# Patient Record
Sex: Female | Born: 1990
Health system: Southern US, Community
[De-identification: ages and names within clinical notes are randomized; demographics above are authoritative.]

## PROBLEM LIST (undated history)

## (undated) DIAGNOSIS — J189 Pneumonia, unspecified organism: Secondary | ICD-10-CM

## (undated) DIAGNOSIS — M549 Dorsalgia, unspecified: Secondary | ICD-10-CM

## (undated) DIAGNOSIS — F419 Anxiety disorder, unspecified: Secondary | ICD-10-CM

## (undated) DIAGNOSIS — E039 Hypothyroidism, unspecified: Secondary | ICD-10-CM

## (undated) DIAGNOSIS — F32A Depression, unspecified: Secondary | ICD-10-CM

## (undated) DIAGNOSIS — T8859XA Other complications of anesthesia, initial encounter: Secondary | ICD-10-CM

## (undated) DIAGNOSIS — D860 Sarcoidosis of lung: Secondary | ICD-10-CM

## (undated) DIAGNOSIS — D649 Anemia, unspecified: Secondary | ICD-10-CM

## (undated) DIAGNOSIS — R002 Palpitations: Secondary | ICD-10-CM

## (undated) DIAGNOSIS — M2550 Pain in unspecified joint: Secondary | ICD-10-CM

## (undated) DIAGNOSIS — E079 Disorder of thyroid, unspecified: Secondary | ICD-10-CM

## (undated) DIAGNOSIS — F329 Major depressive disorder, single episode, unspecified: Secondary | ICD-10-CM

## (undated) DIAGNOSIS — Z87442 Personal history of urinary calculi: Secondary | ICD-10-CM

## (undated) DIAGNOSIS — J45990 Exercise induced bronchospasm: Secondary | ICD-10-CM

## (undated) DIAGNOSIS — Z8619 Personal history of other infectious and parasitic diseases: Secondary | ICD-10-CM

## (undated) DIAGNOSIS — K219 Gastro-esophageal reflux disease without esophagitis: Secondary | ICD-10-CM

## (undated) DIAGNOSIS — Z9889 Other specified postprocedural states: Secondary | ICD-10-CM

## (undated) DIAGNOSIS — N289 Disorder of kidney and ureter, unspecified: Secondary | ICD-10-CM

## (undated) DIAGNOSIS — F988 Other specified behavioral and emotional disorders with onset usually occurring in childhood and adolescence: Secondary | ICD-10-CM

## (undated) DIAGNOSIS — R0600 Dyspnea, unspecified: Secondary | ICD-10-CM

## (undated) DIAGNOSIS — K760 Fatty (change of) liver, not elsewhere classified: Secondary | ICD-10-CM

## (undated) DIAGNOSIS — G2581 Restless legs syndrome: Secondary | ICD-10-CM

## (undated) DIAGNOSIS — M1990 Unspecified osteoarthritis, unspecified site: Secondary | ICD-10-CM

## (undated) DIAGNOSIS — R600 Localized edema: Secondary | ICD-10-CM

## (undated) DIAGNOSIS — Z9289 Personal history of other medical treatment: Secondary | ICD-10-CM

## (undated) DIAGNOSIS — G43909 Migraine, unspecified, not intractable, without status migrainosus: Secondary | ICD-10-CM

## (undated) DIAGNOSIS — R079 Chest pain, unspecified: Secondary | ICD-10-CM

## (undated) DIAGNOSIS — R112 Nausea with vomiting, unspecified: Secondary | ICD-10-CM

## (undated) HISTORY — DX: Exercise induced bronchospasm: J45.990

## (undated) HISTORY — DX: Dorsalgia, unspecified: M54.9

## (undated) HISTORY — DX: Gastro-esophageal reflux disease without esophagitis: K21.9

## (undated) HISTORY — DX: Migraine, unspecified, not intractable, without status migrainosus: G43.909

## (undated) HISTORY — DX: Major depressive disorder, single episode, unspecified: F32.9

## (undated) HISTORY — DX: Dyspnea, unspecified: R06.00

## (undated) HISTORY — DX: Localized edema: R60.0

## (undated) HISTORY — DX: Personal history of urinary calculi: Z87.442

## (undated) HISTORY — PX: TONSILLECTOMY: SUR1361

## (undated) HISTORY — DX: Fatty (change of) liver, not elsewhere classified: K76.0

## (undated) HISTORY — DX: Unspecified osteoarthritis, unspecified site: M19.90

## (undated) HISTORY — DX: Pain in unspecified joint: M25.50

## (undated) HISTORY — DX: Chest pain, unspecified: R07.9

## (undated) HISTORY — PX: WISDOM TOOTH EXTRACTION: SHX21

## (undated) HISTORY — DX: Depression, unspecified: F32.A

## (undated) HISTORY — DX: Personal history of other infectious and parasitic diseases: Z86.19

## (undated) HISTORY — DX: Restless legs syndrome: G25.81

## (undated) HISTORY — DX: Disorder of thyroid, unspecified: E07.9

---
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
## 2014-06-02 DIAGNOSIS — I10 Essential (primary) hypertension: Secondary | ICD-10-CM | POA: Insufficient documentation

## 2015-01-03 ENCOUNTER — Encounter: Payer: Self-pay | Admitting: Family

## 2015-01-03 ENCOUNTER — Ambulatory Visit (INDEPENDENT_AMBULATORY_CARE_PROVIDER_SITE_OTHER): Payer: BLUE CROSS/BLUE SHIELD | Admitting: Family

## 2015-01-03 ENCOUNTER — Telehealth: Payer: Self-pay | Admitting: Family

## 2015-01-03 VITALS — BP 110/88 | HR 90 | Temp 98.0°F | Resp 16 | Ht 67.0 in | Wt 334.8 lb

## 2015-01-03 DIAGNOSIS — E039 Hypothyroidism, unspecified: Secondary | ICD-10-CM

## 2015-01-03 DIAGNOSIS — F32A Depression, unspecified: Secondary | ICD-10-CM | POA: Insufficient documentation

## 2015-01-03 DIAGNOSIS — Z23 Encounter for immunization: Secondary | ICD-10-CM | POA: Diagnosis not present

## 2015-01-03 DIAGNOSIS — G43909 Migraine, unspecified, not intractable, without status migrainosus: Secondary | ICD-10-CM | POA: Insufficient documentation

## 2015-01-03 DIAGNOSIS — R7989 Other specified abnormal findings of blood chemistry: Secondary | ICD-10-CM

## 2015-01-03 DIAGNOSIS — R945 Abnormal results of liver function studies: Secondary | ICD-10-CM

## 2015-01-03 DIAGNOSIS — Z0000 Encounter for general adult medical examination without abnormal findings: Secondary | ICD-10-CM

## 2015-01-03 DIAGNOSIS — F329 Major depressive disorder, single episode, unspecified: Secondary | ICD-10-CM | POA: Insufficient documentation

## 2015-01-03 DIAGNOSIS — K219 Gastro-esophageal reflux disease without esophagitis: Secondary | ICD-10-CM | POA: Insufficient documentation

## 2015-01-03 DIAGNOSIS — F330 Major depressive disorder, recurrent, mild: Secondary | ICD-10-CM | POA: Insufficient documentation

## 2015-01-03 LAB — URINALYSIS, ROUTINE W REFLEX MICROSCOPIC
BILIRUBIN URINE: NEGATIVE
Ketones, ur: NEGATIVE
NITRITE: NEGATIVE
PH: 6 (ref 5.0–8.0)
TOTAL PROTEIN, URINE-UPE24: NEGATIVE
UROBILINOGEN UA: 0.2 (ref 0.0–1.0)
Urine Glucose: NEGATIVE

## 2015-01-03 LAB — HEPATIC FUNCTION PANEL
ALT: 39 U/L — AB (ref 0–35)
AST: 54 U/L — ABNORMAL HIGH (ref 0–37)
Albumin: 4.3 g/dL (ref 3.5–5.2)
Alkaline Phosphatase: 82 U/L (ref 39–117)
BILIRUBIN TOTAL: 0.4 mg/dL (ref 0.2–1.2)
Bilirubin, Direct: 0.1 mg/dL (ref 0.0–0.3)
Total Protein: 8 g/dL (ref 6.0–8.3)

## 2015-01-03 LAB — CBC WITH DIFFERENTIAL/PLATELET
BASOS PCT: 0.4 % (ref 0.0–3.0)
Basophils Absolute: 0 10*3/uL (ref 0.0–0.1)
EOS PCT: 1.8 % (ref 0.0–5.0)
Eosinophils Absolute: 0.2 10*3/uL (ref 0.0–0.7)
HCT: 39.5 % (ref 36.0–46.0)
Hemoglobin: 12.5 g/dL (ref 12.0–15.0)
LYMPHS ABS: 3 10*3/uL (ref 0.7–4.0)
Lymphocytes Relative: 34.8 % (ref 12.0–46.0)
MCHC: 31.7 g/dL (ref 30.0–36.0)
MCV: 71.8 fl — ABNORMAL LOW (ref 78.0–100.0)
MONO ABS: 0.5 10*3/uL (ref 0.1–1.0)
Monocytes Relative: 5.4 % (ref 3.0–12.0)
NEUTROS PCT: 57.6 % (ref 43.0–77.0)
Neutro Abs: 5 10*3/uL (ref 1.4–7.7)
Platelets: 235 10*3/uL (ref 150.0–400.0)
RBC: 5.5 Mil/uL — ABNORMAL HIGH (ref 3.87–5.11)
RDW: 16.3 % — AB (ref 11.5–15.5)
WBC: 8.6 10*3/uL (ref 4.0–10.5)

## 2015-01-03 LAB — BASIC METABOLIC PANEL
BUN: 11 mg/dL (ref 6–23)
CHLORIDE: 104 meq/L (ref 96–112)
CO2: 25 mEq/L (ref 19–32)
Calcium: 9.5 mg/dL (ref 8.4–10.5)
Creatinine, Ser: 0.82 mg/dL (ref 0.40–1.20)
GFR: 90.41 mL/min (ref >60.00)
GLUCOSE: 94 mg/dL (ref 70–99)
POTASSIUM: 3.9 meq/L (ref 3.5–5.1)
Sodium: 140 mEq/L (ref 135–145)

## 2015-01-03 LAB — LIPID PANEL
CHOL/HDL RATIO: 4
Cholesterol: 158 mg/dL (ref 0–200)
HDL: 41.1 mg/dL (ref >39.00)
LDL CALC: 89 mg/dL (ref 0–99)
NonHDL: 117.06
TRIGLYCERIDES: 139 mg/dL (ref 0.0–149.0)
VLDL: 27.8 mg/dL (ref 0.0–40.0)

## 2015-01-03 LAB — TSH: TSH: 5.27 u[IU]/mL — ABNORMAL HIGH (ref 0.35–4.50)

## 2015-01-03 MED ORDER — OMEPRAZOLE 20 MG PO CPDR: 20.0000 mg | DELAYED_RELEASE_CAPSULE | Freq: Every day | ORAL | Status: DC

## 2015-01-03 MED ORDER — LEVOTHYROXINE SODIUM 125 MCG PO TABS: 125.0000 ug | ORAL_TABLET | Freq: Every day | ORAL | Status: DC

## 2015-01-03 NOTE — Assessment & Plan Note (Signed)
Stable on PPI. Has not tolerated trying to come off of PPI.

## 2015-01-03 NOTE — Telephone Encounter (Signed)
Also, her liver function testing is elevated.  I would like her to complete an abdominal ultrasound to evaluate her liver.

## 2015-01-03 NOTE — Patient Instructions (Signed)
Please complete lab work prior to leaving. Schedule a fasting physical at the front desk. Welcome to [facility]! 

## 2015-01-03 NOTE — Progress Notes (Signed)
Pre visit review using our clinic review tool, if applicable. No additional management support is needed unless otherwise documented below in the visit note. 

## 2015-01-03 NOTE — Assessment & Plan Note (Signed)
>>  ASSESSMENT AND PLAN FOR DEPRESSION WRITTEN ON 01/03/2015 12:06 PM BY O'SULLIVAN, Valora Norell, NP  Uncontrolled. Pt is encouraged to follow up as scheduled with psychiatry.

## 2015-01-03 NOTE — Assessment & Plan Note (Signed)
These are managed by use of prn excedrin.

## 2015-01-03 NOTE — Telephone Encounter (Signed)
Please contact pt and let her know that lab work shows Synthroid should be increased from to 125 mcg. I have sent rx to her pharmacy. We can repeat lab at her physical.

## 2015-01-03 NOTE — Progress Notes (Signed)
Subjective:    Patient ID: Gay Filler, female    DOB: [birth year], 25 y.o.   MRN: [id number]  HPI  Ms. Pollick is a 24 yr old female who presents today to establish care.  Pmhx is significant for the following:  1) Depression- reports that latuda helps to control her anger. This was added after her most recent visit with her psychiatrist. Despite addition of latuda, she reports that she has lethargy,  Feels Unmotivated.  She is followed by Vear Clock DNP for psychiatry.  She reports that she has follow up on 01/18/15. She also sees Lavella Lemons weekly- counselor.  Denies SI/HI  2) Hypothyroid- was diagnosed at least 3 years ago.    3) Kidney Stones- had flare 8 years ago.    4) GERD- reports that she is well controlled on  of prilosec.    5) Migraines- uses otc excedrin migraine which helps, reports 2  Migraines/month. Reports that this is improved for her.  Review of Systems  Constitutional:       Reports weight has been stable  HENT: Positive for rhinorrhea.   Eyes: Negative for visual disturbance.  Respiratory: Negative for cough.   Cardiovascular: Negative for leg swelling.  Gastrointestinal: Negative for diarrhea and constipation.  Genitourinary: Negative for dysuria, frequency and menstrual problem.  Musculoskeletal: Negative for myalgias and arthralgias.  Skin: Negative for rash.  Neurological: Positive for headaches.  Hematological: Negative for adenopathy.  Psychiatric/Behavioral:       See HPI      Past Medical History  Diagnosis Date  . History of chicken pox   . Depression   . History of kidney stones   . Thyroid disease     hypothyroid    Social History   Social History  . Marital Status: Single    Spouse Name: N/A  . Number of Children: N/A  . Years of Education: N/A   Occupational History  . Not on file.   Social History Main Topics  . Smoking status: Never Smoker   . Smokeless tobacco: Never Used  . Alcohol Use: No  . Drug Use: No    . Sexual Activity: Not on file   Other Topics Concern  . Not on file   Social History Narrative    Past Surgical History  Procedure Laterality Date  . Tonsillectomy  2010  . Knee arthroscopy Right 2007    Family History  Problem Relation Age of Onset  . Cancer Paternal Aunt     colon  . Cancer Paternal Uncle     "small cell cancer"  . Arthritis Maternal Grandmother   . Diabetes Maternal Grandfather   . Arthritis Maternal Grandfather   . Arthritis Paternal Grandmother   . Arthritis Paternal Grandfather     No Known Allergies  No current outpatient prescriptions on file prior to visit.   No current facility-administered medications on file prior to visit.    BP 110/88 mmHg  Pulse 90  Temp(Src) 98 F (36.7 C) (Oral)  Resp 16  Ht  (1.702 m)  Wt 334 lb 12.8 oz (151.864 kg)  BMI 52.42 kg/m2  SpO2 98%  LMP 12/22/2014    Objective:   Physical Exam  Constitutional: She is oriented to person, place, and time.  Morbidly obese white female in NAD  HENT:  Head: Normocephalic and atraumatic.  Right Ear: Tympanic membrane and ear canal normal.  Left Ear: Tympanic membrane and ear canal normal.  Mouth/Throat: No oropharyngeal exudate, posterior oropharyngeal  edema or posterior oropharyngeal erythema.  Eyes: No scleral icterus.  Neck: No thyromegaly present.  Cardiovascular: Normal rate and regular rhythm.   No murmur heard. Pulmonary/Chest: Effort normal and breath sounds normal. No respiratory distress. She has no wheezes. She has no rales. She exhibits no tenderness.  Musculoskeletal: She exhibits no edema.  Lymphadenopathy:    She has no cervical adenopathy.  Neurological: She is alert and oriented to person, place, and time.  Skin: Skin is warm and dry.  Psychiatric: Her behavior is normal. Judgment and thought content normal.  Flat affect          Assessment & Plan:

## 2015-01-03 NOTE — Assessment & Plan Note (Signed)
Uncontrolled. Pt is encouraged to follow up as scheduled with psychiatry.

## 2015-01-03 NOTE — Assessment & Plan Note (Signed)
Obtain TSH, continue synthroid.  

## 2015-01-04 NOTE — Telephone Encounter (Signed)
Left message for pt to return my call.

## 2015-01-05 ENCOUNTER — Ambulatory Visit (HOSPITAL_BASED_OUTPATIENT_CLINIC_OR_DEPARTMENT_OTHER)
Admission: RE | Admit: 2015-01-05 | Discharge: 2015-01-05 | Disposition: A | Discharge status: Home / Self Care | Payer: BLUE CROSS/BLUE SHIELD | Source: Ambulatory Visit | Attending: Family | Admitting: Family

## 2015-01-05 DIAGNOSIS — K760 Fatty (change of) liver, not elsewhere classified: Secondary | ICD-10-CM | POA: Insufficient documentation

## 2015-01-05 DIAGNOSIS — R7989 Other specified abnormal findings of blood chemistry: Secondary | ICD-10-CM | POA: Diagnosis present

## 2015-01-05 NOTE — Telephone Encounter (Signed)
Pt completed US.  US shows fatty liver.  Low fat/low cholesterol, diet, exercise and weight loss are the best treatment for fatty liver.

## 2015-01-05 NOTE — Telephone Encounter (Signed)
Left message for pt to return my call.

## 2015-01-06 NOTE — Telephone Encounter (Signed)
LDMOC for pt to call back if she has any questions about the lab results.

## 2015-02-02 ENCOUNTER — Encounter: Payer: Self-pay | Admitting: Family

## 2015-02-02 ENCOUNTER — Ambulatory Visit (INDEPENDENT_AMBULATORY_CARE_PROVIDER_SITE_OTHER): Payer: BLUE CROSS/BLUE SHIELD | Admitting: Family

## 2015-02-02 VITALS — BP 116/80 | HR 64 | Temp 97.9°F | Resp 16 | Ht 67.0 in | Wt 332.8 lb

## 2015-02-02 DIAGNOSIS — Z114 Encounter for screening for human immunodeficiency virus [HIV]: Secondary | ICD-10-CM

## 2015-02-02 DIAGNOSIS — R718 Other abnormality of red blood cells: Secondary | ICD-10-CM

## 2015-02-02 DIAGNOSIS — Z0000 Encounter for general adult medical examination without abnormal findings: Secondary | ICD-10-CM

## 2015-02-02 DIAGNOSIS — K760 Fatty (change of) liver, not elsewhere classified: Secondary | ICD-10-CM | POA: Insufficient documentation

## 2015-02-02 DIAGNOSIS — E039 Hypothyroidism, unspecified: Secondary | ICD-10-CM

## 2015-02-02 MED ORDER — MULTI-VITAMIN/MINERALS PO TABS: 1.0000 | ORAL_TABLET | Freq: Every day | ORAL | Status: AC

## 2015-02-02 NOTE — Patient Instructions (Signed)
Please complete lab work prior to leaving. Work on healthy well balanced diet, exercise and weight loss. You will be contacted about your referral to nutrition. Start multivitamin with minerals once daily.

## 2015-02-02 NOTE — Progress Notes (Signed)
Pre visit review using our clinic review tool, if applicable. No additional management support is needed unless otherwise documented below in the visit note. 

## 2015-02-02 NOTE — Progress Notes (Signed)
Subjective:    Patient ID: Margaret Golden, female    DOB: 05/14/1990, 24 y.o.   MRN: [id number]  HPI   Margaret Golden is a 24 yr old female who presents today for cpx.  Patient presents today for complete physical.  Immunizations: ? Last tetanus. Flu shot up to date Diet: reports poor food choices, +late night eating, not exercising.  Lately has not had appetite due to depression symptoms. They are being evicted from their apartment and in search of a new housing option. Wt Readings from Last 3 Encounters:  02/02/15 332 lb 12.8 oz (150.957 kg)  01/03/15 334 lb 12.8 oz (151.864 kg)  Pap Smear: 2/16- normal per pt- done by GYN. Reports that she is not sexually active  Depression- Recently stopped latuda per psych recs..  Feels "numb" has follow up with Vear ClockLeslie O'Neil NP in 2 weeks. Denies SI.  Hypothyroid- last visit TSH was elevated and it was recommended that she increase synthroid dose from 100 mcg to 125 mcg.   Review of Systems  Constitutional: Negative for unexpected weight change.  HENT: Negative for rhinorrhea.   Respiratory: Negative for cough.   Cardiovascular: Negative for leg swelling.  Gastrointestinal: Negative for constipation.       Mild diarrhea for the last few days  Genitourinary: Negative for dysuria and frequency.       Reports periods are heavy  Musculoskeletal: Negative for myalgias and arthralgias.  Skin: Negative for rash.  Neurological:       Reports HA's have been stable  Hematological: Negative for adenopathy.  Psychiatric/Behavioral:       Some anxiety- at baseline.     Past Medical History  Diagnosis Date  . History of chicken pox   . Depression   . History of kidney stones   . Thyroid disease     hypothyroid    Social History   Social History  . Marital Status: Single    Spouse Name: N/A  . Number of Children: N/A  . Years of Education: N/A   Occupational History  . Not on file.   Social History Main Topics  . Smoking status: Never  Smoker   . Smokeless tobacco: Never Used  . Alcohol Use: No  . Drug Use: No  . Sexual Activity: Not on file   Other Topics Concern  . Not on file   Social History Narrative   Unemployed   Lives with Dad   Seeking work   Only child   Dog     Past Surgical History  Procedure Laterality Date  . Tonsillectomy  2010  . Knee arthroscopy Right 2007    Family History  Problem Relation Age of Onset  . Cancer Paternal Aunt     colon  . Cancer Paternal Uncle     "small cell cancer"  . Arthritis Maternal Grandmother   . Diabetes Maternal Grandfather   . Arthritis Maternal Grandfather   . Arthritis Paternal Grandmother   . Arthritis Paternal Grandfather     No Known Allergies  Current Outpatient Prescriptions on File Prior to Visit  Medication Sig Dispense Refill  . hydrOXYzine (ATARAX/VISTARIL) 25 MG tablet Take 25 mg by mouth 3 (three) times daily.    Marland Kitchen. levothyroxine (SYNTHROID, LEVOTHROID) 125 MCG tablet Take 1 tablet (125 mcg total) by mouth daily. 30 tablet 2  . omeprazole (PRILOSEC) 20 MG capsule Take 1 capsule (20 mg total) by mouth daily. 90 capsule 1   No current facility-administered medications on file  prior to visit.    BP 116/80 mmHg  Pulse 64  Temp(Src) 97.9 F (36.6 C) (Oral)  Resp 16  Ht  (1.702 m)  Wt 332 lb 12.8 oz (150.957 kg)  BMI 52.11 kg/m2  LMP 01/20/2015       Objective:   Physical Exam  Physical Exam  Constitutional: She is oriented to person, place, and time. She appears morbidly obese.No distress. + body odor noted HENT:  Head: Normocephalic and atraumatic.  Right Ear: Tympanic membrane and ear canal normal.  Left Ear: Tympanic membrane and ear canal normal.  Mouth/Throat: Oropharynx is clear and moist.  Eyes: Pupils are equal, round, and reactive to light. No scleral icterus.  Neck: Normal range of motion. No thyromegaly present.  Cardiovascular: Normal rate and regular rhythm.   No murmur heard. Pulmonary/Chest: Effort  normal and breath sounds normal. No respiratory distress. He has no wheezes. She has no rales. She exhibits no tenderness.  Abdominal: Soft. Bowel sounds are normal. He exhibits no distension and no mass. There is no tenderness. There is no rebound and no guarding.  Musculoskeletal: She exhibits no edema.  Lymphadenopathy:    She has no cervical adenopathy.  Neurological: She is alert and oriented to person, place, and time.  She exhibits normal muscle tone.  Skin: Skin is warm and dry.  Psychiatric: She has a normal mood and affect. Her behavior is normal. Judgment and thought content normal.  Breasts: Examined lying Right: Without masses, retractions, discharge or axillary adenopathy.  Left: Without masses, retractions, discharge or axillary adenopathy.  Pelvic: deferred          Assessment & Plan:        Assessment & Plan:  Hypothyroid- obtain follow up TSH  Microcytosis- noted last visit on CBC. Add mvi with minerals, obtain serum iron level. She tells me that all she ate yesterday was a sleeve of saltines and water.  We discussed importance of a well balanced diet with protein, fruits/veggies.   Fatty liver- discussed that low fat/low cholesterol diet, exercise and weigh loss are the best treatment for fatty liver.  Preventative care- Tdap today.  Weight loss- nutrition referral.

## 2015-02-03 LAB — TSH: TSH: 2.04 u[IU]/mL (ref 0.35–4.50)

## 2015-02-03 LAB — IRON: IRON: 33 ug/dL — AB (ref 42–145)

## 2015-02-04 ENCOUNTER — Telehealth: Payer: Self-pay | Admitting: Family

## 2015-02-04 DIAGNOSIS — D509 Iron deficiency anemia, unspecified: Secondary | ICD-10-CM

## 2015-02-04 MED ORDER — IRON 325 (65 FE) MG PO TABS: 1.0000 | ORAL_TABLET | Freq: Two times a day (BID) | ORAL | Status: DC

## 2015-02-04 NOTE — Telephone Encounter (Signed)
Left detailed message on pt's cell# to call and schedule lab appt in 3 months. Lab order entered.

## 2015-02-04 NOTE — Telephone Encounter (Signed)
Iron is low.  Add mvi + iron 325mg  twice daily, repeat iron level in 3 months, dx iron deficiency.  Thyroid function is normal.

## 2015-03-02 ENCOUNTER — Telehealth: Payer: Self-pay | Admitting: *Deleted

## 2015-03-02 NOTE — Telephone Encounter (Signed)
Received pt signed ROI release from Charlies SilversParish A. McKinney, MD for lab results form Jan 2016 to present. Requested info faxed to [phone] successfully. Release sent for scanning. JG//CMA

## 2015-04-13 ENCOUNTER — Other Ambulatory Visit: Payer: Self-pay | Admitting: Family

## 2015-04-13 ENCOUNTER — Ambulatory Visit (INDEPENDENT_AMBULATORY_CARE_PROVIDER_SITE_OTHER): Payer: Self-pay | Admitting: Family

## 2015-04-13 ENCOUNTER — Encounter: Payer: Self-pay | Admitting: Family

## 2015-04-13 VITALS — BP 108/80 | HR 113 | Temp 97.7°F | Resp 18 | Ht 67.0 in | Wt 328.2 lb

## 2015-04-13 DIAGNOSIS — D509 Iron deficiency anemia, unspecified: Secondary | ICD-10-CM

## 2015-04-13 LAB — CBC WITH DIFFERENTIAL/PLATELET
BASOS PCT: 0.4 % (ref 0.0–3.0)
Basophils Absolute: 0 10*3/uL (ref 0.0–0.1)
EOS ABS: 0.2 10*3/uL (ref 0.0–0.7)
EOS PCT: 2.1 % (ref 0.0–5.0)
HEMATOCRIT: 40.8 % (ref 36.0–46.0)
HEMOGLOBIN: 12.9 g/dL (ref 12.0–15.0)
LYMPHS PCT: 22.3 % (ref 12.0–46.0)
Lymphs Abs: 2.3 10*3/uL (ref 0.7–4.0)
MCHC: 31.5 g/dL (ref 30.0–36.0)
MCV: 72.4 fl — ABNORMAL LOW (ref 78.0–100.0)
Monocytes Absolute: 0.5 10*3/uL (ref 0.1–1.0)
Monocytes Relative: 4.5 % (ref 3.0–12.0)
Neutro Abs: 7.3 10*3/uL (ref 1.4–7.7)
Neutrophils Relative %: 70.7 % (ref 43.0–77.0)
Platelets: 245 10*3/uL (ref 150.0–400.0)
RBC: 5.64 Mil/uL — AB (ref 3.87–5.11)
RDW: 16 % — AB (ref 11.5–15.5)
WBC: 10.3 10*3/uL (ref 4.0–10.5)

## 2015-04-13 LAB — IRON: Iron: 44 ug/dL (ref 42–145)

## 2015-04-13 MED ORDER — IRON 325 (65 FE) MG PO TABS: 1.0000 | ORAL_TABLET | Freq: Every day | ORAL | Status: DC

## 2015-04-13 NOTE — Progress Notes (Signed)
Subjective:    Patient ID: Margaret Golden, female    DOB: 02/20/1991, 25 y.o.   MRN: [id number]  HPI  [name] is a 25 yr old female who presents today with chief complaint of voice hoarseness.  Reports nasal congestion began 5 days ago. Developed cough on Monday. She denies fever.  Has not tried any otc meds.  Denies ear pain or throat pain.   Review of Systems See HPI  Past Medical History  Diagnosis Date  . History of chicken pox   . Depression   . History of kidney stones   . Thyroid disease     hypothyroid    Social History   Social History  . Marital Status: Single    Spouse Name: N/A  . Number of Children: N/A  . Years of Education: N/A   Occupational History  . Not on file.   Social History Main Topics  . Smoking status: Never Smoker   . Smokeless tobacco: Never Used  . Alcohol Use: No  . Drug Use: No  . Sexual Activity: Not on file   Other Topics Concern  . Not on file   Social History Narrative   Unemployed   Lives with Dad   Seeking work   Only child   Dog     Past Surgical History  Procedure Laterality Date  . Tonsillectomy  2010  . Knee arthroscopy Right 2007    Family History  Problem Relation Age of Onset  . Cancer Paternal Aunt     colon  . Cancer Paternal Uncle     "small cell cancer"  . Arthritis Maternal Grandmother   . Diabetes Maternal Grandfather   . Arthritis Maternal Grandfather   . Arthritis Paternal Grandmother   . Arthritis Paternal Grandfather     No Known Allergies  Current Outpatient Prescriptions on File Prior to Visit  Medication Sig Dispense Refill  . Ferrous Sulfate (IRON) 325 (65 FE) MG TABS Take 1 tablet by mouth 2 (two) times daily. 30 each 0  . hydrOXYzine (ATARAX/VISTARIL) 25 MG tablet Take 25 mg by mouth 3 (three) times daily.    Marland Kitchen. levothyroxine (SYNTHROID, LEVOTHROID) 125 MCG tablet Take 1 tablet (125 mcg total) by mouth daily. 30 tablet 2  . Multiple Vitamins-Minerals (MULTIVITAMIN WITH MINERALS)  tablet Take 1 tablet by mouth daily.    Marland Kitchen. omeprazole (PRILOSEC) 20 MG capsule Take 1 capsule (20 mg total) by mouth daily. 90 capsule 1   No current facility-administered medications on file prior to visit.    BP 108/80 mmHg  Pulse 113  Temp(Src) 97.7 F (36.5 C) (Oral)  Resp 18  Ht 5\' 7"  (1.702 m)  Wt 328 lb 3.2 oz (148.871 kg)  BMI 51.39 kg/m2  SpO2 98%  LMP 04/06/2015       Objective:   Physical Exam  Constitutional: She appears well-developed and well-nourished.  HENT:  Right Ear: Tympanic membrane and ear canal normal.  Left Ear: Tympanic membrane and ear canal normal.  Mouth/Throat: No oropharyngeal exudate, posterior oropharyngeal edema or posterior oropharyngeal erythema.  Cardiovascular: Normal rate, regular rhythm and normal heart sounds.   No murmur heard. Pulmonary/Chest: Effort normal and breath sounds normal. No respiratory distress. She has no wheezes.  Psychiatric: She has a normal mood and affect. Her behavior is normal. Judgment and thought content normal.          Assessment & Plan:  Symptoms most consistent with viral URI.  Advised pt:   Please rest,  drink lots of fluids, use tylenol/motrin as needed. Call if symptoms worsen, if fever >101 or if your symptoms are not improved in 3-4 days.   Iron def Anemia- repeat iron level/cbc today. She is on iron supplement.

## 2015-04-13 NOTE — Progress Notes (Signed)
Pre visit review using our clinic review tool, if applicable. No additional management support is needed unless otherwise documented below in the visit note. 

## 2015-04-13 NOTE — Patient Instructions (Signed)
Complete lab work prior to leaving.  Your symptoms are most consistent with a viral upper respiratory infection. Please rest, drink lots of fluids, use tylenol/motrin as needed. Call if symptoms worsen, if fever >101 or if your symptoms are not improved in 3-4 days.

## 2015-04-20 ENCOUNTER — Encounter: Payer: Self-pay | Admitting: Family

## 2015-04-20 ENCOUNTER — Ambulatory Visit (INDEPENDENT_AMBULATORY_CARE_PROVIDER_SITE_OTHER): Payer: Self-pay | Admitting: Family

## 2015-04-20 VITALS — BP 110/84 | HR 84 | Temp 98.4°F | Resp 18 | Ht 67.0 in | Wt 326.2 lb

## 2015-04-20 DIAGNOSIS — J209 Acute bronchitis, unspecified: Secondary | ICD-10-CM | POA: Diagnosis not present

## 2015-04-20 MED ORDER — AZITHROMYCIN 250 MG PO TABS: ORAL_TABLET | ORAL | Status: DC

## 2015-04-20 NOTE — Progress Notes (Signed)
Pre visit review using our clinic review tool, if applicable. No additional management support is needed unless otherwise documented below in the visit note. 

## 2015-04-20 NOTE — Patient Instructions (Signed)
Start zithromax. Continue otc cough medication as needed such as Delsym. Call if symptoms worsen, if you develop fever, or if symptoms are not improved in 3 days.

## 2015-04-20 NOTE — Progress Notes (Signed)
Subjective:    Patient ID: Gay Filler, female    DOB: June 08, 1990, 25 y.o.   MRN: [id number]  HPI  Ms. Stegall is a 25 yr old female who presents today for follow up. She was seen 1 week ago with sxs most consistent with viral URI.  She was advised on supportive measures. Today she reports no improvement in her laryngitis is unchanged.  Reports cough has worsened.  Mild nasal congestion, moderate chest congestion. Energy has been low. Denies associated sore throat or ear pain.  Denies wheezing.  Initial sxs began on 04/10/15.   Review of Systems See HPI  Past Medical History  Diagnosis Date  . History of chicken pox   . Depression   . History of kidney stones   . Thyroid disease     hypothyroid    Social History   Social History  . Marital Status: Single    Spouse Name: N/A  . Number of Children: N/A  . Years of Education: N/A   Occupational History  . Not on file.   Social History Main Topics  . Smoking status: Never Smoker   . Smokeless tobacco: Never Used  . Alcohol Use: No  . Drug Use: No  . Sexual Activity: Not on file   Other Topics Concern  . Not on file   Social History Narrative   Unemployed   Lives with Dad   Seeking work   Only child   Dog     Past Surgical History  Procedure Laterality Date  . Tonsillectomy  2010  . Knee arthroscopy Right 2007    Family History  Problem Relation Age of Onset  . Cancer Paternal Aunt     colon  . Cancer Paternal Uncle     "small cell cancer"  . Arthritis Maternal Grandmother   . Diabetes Maternal Grandfather   . Arthritis Maternal Grandfather   . Arthritis Paternal Grandmother   . Arthritis Paternal Grandfather     No Known Allergies  Current Outpatient Prescriptions on File Prior to Visit  Medication Sig Dispense Refill  . Ferrous Sulfate (IRON) 325 (65 Fe) MG TABS Take 1 tablet by mouth daily. 30 each 0  . hydrOXYzine (ATARAX/VISTARIL) 25 MG tablet Take 25 mg by mouth 3 (three) times daily.    Marland Kitchen  levothyroxine (SYNTHROID, LEVOTHROID) 125 MCG tablet Take 1 tablet (125 mcg total) by mouth daily. 30 tablet 2  . Multiple Vitamins-Minerals (MULTIVITAMIN WITH MINERALS) tablet Take 1 tablet by mouth daily.    Marland Kitchen omeprazole (PRILOSEC) 20 MG capsule Take 1 capsule (20 mg total) by mouth daily. 90 capsule 1   No current facility-administered medications on file prior to visit.    BP 110/84 mmHg  Pulse 84  Temp(Src) 98.4 F (36.9 C) (Oral)  Resp 18  Ht  (1.702 m)  Wt 326 lb 3.2 oz (147.963 kg)  BMI 51.08 kg/m2  SpO2 100%  LMP 04/06/2015       Objective:   Physical Exam  Constitutional: She appears well-developed and well-nourished.  HENT:  Head: Normocephalic and atraumatic.  Right Ear: Tympanic membrane and ear canal normal.  Left Ear: Tympanic membrane and ear canal normal.  Mouth/Throat: No oropharyngeal exudate, posterior oropharyngeal edema or posterior oropharyngeal erythema.  Mild voice hoarseness  Cardiovascular: Normal rate, regular rhythm and normal heart sounds.   No murmur heard. Pulmonary/Chest: Effort normal and breath sounds normal. No respiratory distress. She has no wheezes.  Psychiatric: She has a normal mood and  affect. Her behavior is normal. Judgment and thought content normal.          Assessment & Plan:  Bronchitis- given worsening sxs and duration, will rx with zpak. She feels otc cough suppressant is adequate.

## 2015-05-01 ENCOUNTER — Encounter (HOSPITAL_BASED_OUTPATIENT_CLINIC_OR_DEPARTMENT_OTHER): Payer: Self-pay

## 2015-05-01 ENCOUNTER — Emergency Department (HOSPITAL_BASED_OUTPATIENT_CLINIC_OR_DEPARTMENT_OTHER): Payer: No Typology Code available for payment source

## 2015-05-01 ENCOUNTER — Emergency Department (HOSPITAL_BASED_OUTPATIENT_CLINIC_OR_DEPARTMENT_OTHER)
Admission: EM | Admit: 2015-05-01 | Discharge: 2015-05-01 | Disposition: A | Discharge status: Home / Self Care | Payer: No Typology Code available for payment source | Attending: Emergency Medicine | Admitting: Emergency Medicine

## 2015-05-01 DIAGNOSIS — J159 Unspecified bacterial pneumonia: Secondary | ICD-10-CM | POA: Diagnosis not present

## 2015-05-01 DIAGNOSIS — Z79899 Other long term (current) drug therapy: Secondary | ICD-10-CM | POA: Diagnosis not present

## 2015-05-01 DIAGNOSIS — R05 Cough: Secondary | ICD-10-CM | POA: Diagnosis present

## 2015-05-01 DIAGNOSIS — Z3202 Encounter for pregnancy test, result negative: Secondary | ICD-10-CM | POA: Insufficient documentation

## 2015-05-01 DIAGNOSIS — Z8619 Personal history of other infectious and parasitic diseases: Secondary | ICD-10-CM | POA: Diagnosis not present

## 2015-05-01 DIAGNOSIS — E039 Hypothyroidism, unspecified: Secondary | ICD-10-CM | POA: Insufficient documentation

## 2015-05-01 DIAGNOSIS — R030 Elevated blood-pressure reading, without diagnosis of hypertension: Secondary | ICD-10-CM | POA: Insufficient documentation

## 2015-05-01 DIAGNOSIS — R000 Tachycardia, unspecified: Secondary | ICD-10-CM | POA: Diagnosis not present

## 2015-05-01 DIAGNOSIS — Z87448 Personal history of other diseases of urinary system: Secondary | ICD-10-CM | POA: Diagnosis not present

## 2015-05-01 DIAGNOSIS — J189 Pneumonia, unspecified organism: Secondary | ICD-10-CM

## 2015-05-01 DIAGNOSIS — F329 Major depressive disorder, single episode, unspecified: Secondary | ICD-10-CM | POA: Insufficient documentation

## 2015-05-01 DIAGNOSIS — M25512 Pain in left shoulder: Secondary | ICD-10-CM | POA: Insufficient documentation

## 2015-05-01 LAB — URINALYSIS, ROUTINE W REFLEX MICROSCOPIC
Bilirubin Urine: NEGATIVE
Glucose, UA: NEGATIVE mg/dL
Hgb urine dipstick: NEGATIVE
Ketones, ur: NEGATIVE mg/dL
NITRITE: NEGATIVE
PROTEIN: NEGATIVE mg/dL
Specific Gravity, Urine: 1.023 (ref 1.005–1.030)
pH: 5.5 (ref 5.0–8.0)

## 2015-05-01 LAB — URINE MICROSCOPIC-ADD ON: RBC / HPF: NONE SEEN RBC/hpf (ref 0–5)

## 2015-05-01 LAB — PREGNANCY, URINE: PREG TEST UR: NEGATIVE

## 2015-05-01 MED ORDER — DOXYCYCLINE HYCLATE 100 MG PO CAPS: 100.0000 mg | ORAL_CAPSULE | Freq: Two times a day (BID) | ORAL | Status: DC

## 2015-05-01 MED ORDER — OXYCODONE-ACETAMINOPHEN 5-325 MG PO TABS
1.0000 | ORAL_TABLET | Freq: Once | ORAL | Status: AC
  Administered 2015-05-01: 1 via ORAL
  Filled 2015-05-01: qty 1

## 2015-05-01 NOTE — ED Provider Notes (Signed)
CSN: [id number]     Arrival date & time 05/01/15  1007 History   First MD Initiated Contact with Patient 05/01/15 1200     Chief Complaint  Patient presents with  . Flank Pain  . Shoulder Pain     (Consider location/radiation/quality/duration/timing/severity/associated sxs/prior Treatment) HPI Margaret Golden is a 25 y.o. female history of kidney stones, comes in for a dilation of left-sided flank and shoulder pain. Patient reports symptoms started gradually this morning at 3:00 AM, has been constant since that time. She reports sensation is "sharp like needles". Breathing and deep respirations worsen her discomfort. Nothing seems to make it better. She currently rates her discomfort as 9/10. She denies any associated fever, cough, shortness of breath or rash. No abdominal pain, nausea or vomiting, urinary symptoms, pelvic pain, vaginal bleeding or discharge. Last normal menstrual cycle was the second week of January and normal. No other modifying factors.  Past Medical History  Diagnosis Date  . History of chicken pox   . Depression   . History of kidney stones   . Thyroid disease     hypothyroid   Past Surgical History  Procedure Laterality Date  . Tonsillectomy  2010  . Knee arthroscopy Right 2007   Family History  Problem Relation Age of Onset  . Cancer Paternal Aunt     colon  . Cancer Paternal Uncle     "small cell cancer"  . Arthritis Maternal Grandmother   . Diabetes Maternal Grandfather   . Arthritis Maternal Grandfather   . Arthritis Paternal Grandmother   . Arthritis Paternal Grandfather    Social History  Substance Use Topics  . Smoking status: Never Smoker   . Smokeless tobacco: Never Used  . Alcohol Use: No   OB History    No data available     Review of Systems A 10 point review of systems was completed and was negative except for pertinent positives and negatives as mentioned in the history of present illness     Allergies  Review of patient's  allergies indicates no known allergies.  Home Medications   Prior to Admission medications   Medication Sig Start Date End Date Taking? Authorizing Provider  buPROPion (WELLBUTRIN SR) 150 MG 12 hr tablet Take 150 mg by mouth daily.   Yes Historical Provider, MD  doxycycline (VIBRAMYCIN) 100 MG capsule Take 1 capsule (100 mg total) by mouth 2 (two) times daily. One po bid x 7 days 05/01/15   Joycie Peek, PA-C  Ferrous Sulfate (IRON) 325 (65 Fe) MG TABS Take 1 tablet by mouth daily. 04/13/15   [name], NP  hydrOXYzine (ATARAX/VISTARIL) 25 MG tablet Take 25 mg by mouth 3 (three) times daily.    Historical Provider, MD  levothyroxine (SYNTHROID, LEVOTHROID) 125 MCG tablet Take 1 tablet (125 mcg total) by mouth daily. 01/03/15   [name], NP  Multiple Vitamins-Minerals (MULTIVITAMIN WITH MINERALS) tablet Take 1 tablet by mouth daily. 02/02/15   [name], NP  omeprazole (PRILOSEC) 20 MG capsule Take 1 capsule (20 mg total) by mouth daily. 01/03/15 01/03/16  [name], NP   BP 120/86 mmHg  Pulse 88  Temp(Src) 98.4 F (36.9 C) (Oral)  Resp 20  Ht  (1.702 m)  Wt 145.151 kg  BMI 50.11 kg/m2  SpO2 98%  LMP 04/06/2015 Physical Exam  Constitutional: She is oriented to person, place, and time. She appears well-developed and well-nourished.  Morbidly obese Caucasian female. No apparent distress  HENT:  Head: Normocephalic and  atraumatic.  Mouth/Throat: Oropharynx is clear and moist.  Eyes: Conjunctivae are normal. Pupils are equal, round, and reactive to light. Right eye exhibits no discharge. Left eye exhibits no discharge. No scleral icterus.  Neck: Neck supple.  Cardiovascular: Normal rate, regular rhythm and normal heart sounds.   Pulmonary/Chest: Breath sounds normal. No respiratory distress. She has no wheezes. She has no rales.  Weak effort on respirations  Abdominal: Soft. She exhibits no distension. There is no tenderness. There is no rebound  and no guarding.  No CVA tenderness  Musculoskeletal:  Diffuse tenderness throughout left posterior mid thoracic ribs. Palpation reproduces same sharp needlelike pain. No crepitus, erythema, rash or other abnormalities noted. Full active range of motion of left shoulder with no focal bony tenderness or other abnormalities.  Neurological: She is alert and oriented to person, place, and time.  Cranial Nerves II-XII grossly intact  Skin: Skin is warm and dry. No rash noted.  Psychiatric: She has a normal mood and affect.  Nursing note and vitals reviewed.   ED Course  Procedures (including critical care time) Labs Review Labs Reviewed  URINALYSIS, ROUTINE W REFLEX MICROSCOPIC (NOT AT [facility]) - Abnormal; Notable for the following:    APPearance CLOUDY (*)    Leukocytes, UA TRACE (*)    All other components within normal limits  URINE MICROSCOPIC-ADD ON - Abnormal; Notable for the following:    Squamous Epithelial / LPF 0-5 (*)    Bacteria, UA MANY (*)    Casts HYALINE CASTS (*)    All other components within normal limits  URINE CULTURE  PREGNANCY, URINE    Imaging Review Dg Chest 2 View  05/01/2015  CLINICAL DATA:  Cough, shortness of breath, recent bronchitis. EXAM: CHEST  2 VIEW COMPARISON:  Unavailable FINDINGS: low lung volumes with linear bandlike left basilar opacity compatible with atelectasis. Difficult to exclude early developing pneumonia. Right lung remains clear. Normal heart size and vascularity. No edema, effusion, or pneumothorax. Trachea midline. No osseous abnormality. IMPRESSION: Left basilar atelectasis versus early pneumonia.  Low volume exam. Electronically Signed   By: Judie Petit.  Shick M.D.   On: 05/01/2015 12:33   Dg Shoulder Left  05/01/2015  CLINICAL DATA:  Acute shoulder pain EXAM: LEFT SHOULDER - 2+ VIEW COMPARISON:  None. FINDINGS: There is no evidence of fracture or dislocation. There is no evidence of arthropathy or other focal bone abnormality. Soft tissues are  unremarkable. IMPRESSION: No acute osseous finding. Electronically Signed   By: Judie Petit.  Shick M.D.   On: 05/01/2015 10:52   I have personally reviewed and evaluated these images and lab results as part of my medical decision-making.   EKG Interpretation None     Meds given in ED:  Medications  oxyCODONE-acetaminophen (PERCOCET/ROXICET) 5-325 MG per tablet 1 tablet (1 tablet Oral Given 05/01/15 1152)    Discharge Medication List as of 05/01/2015  1:11 PM    START taking these medications   Details  doxycycline (VIBRAMYCIN) 100 MG capsule Take 1 capsule (100 mg total) by mouth 2 (two) times daily. One po bid x 7 days, Starting 05/01/2015, Until Discontinued, Print       Filed Vitals:   05/01/15 1016 05/01/15 1142 05/01/15 1313  BP: 138/103 108/84 120/86  Pulse: 117 91 88  Temp: 98.4 F (36.9 C)    TempSrc: Oral    Resp: Height:  (1.702 m)    Weight: 145.151 kg    SpO2: 98% 96% 98%  MDM  Vianney Kopecky is a 25 y.o. female who comes in for evaluation of left-sided flank and shoulder pain that is worse with respiration, onset this morning at 3:00 AM. On arrival, she did have slightly elevated blood pressure and was fairly tachycardic, however I assume this was due to movement because once patient was seated in exam room, vitals stabilized and are now within normal limits. She remains afebrile. Urinalysis does show many bacteria with few leukocytes and epithelial cells, however patient denies any urinary symptoms, abdominal pain she has no CVA tenderness on exam. No nausea or vomiting. Low suspicion for pyelonephritis. However, we will obtain urinary culture. Chest x-ray does show possibly a developing pneumonia in her left lower lobe, consistent with location of patient's pain. We'll plan to treat for community acquired pneumonia and have patient follow-up with PCP next week. She and father at bedside verbalizes understanding and agrees with this plan. Overall she appears  very well, reports she feels much better in the emergency department, remains hemodynamically stable and appropriate for outpatient follow-up. Final diagnoses:  Community acquired pneumonia       Joycie Peek, PA-C 05/01/15 2142  Doug Sou, MD 05/02/15 [phone]

## 2015-05-01 NOTE — ED Notes (Signed)
Patient here with left flank pain that awoke her from sleep, pain worse with movement, also complains of left shoulder pain, denies injury, pain with any lateral and overhead movement

## 2015-05-01 NOTE — ED Notes (Signed)
BP taken twice with XL cuff.

## 2015-05-01 NOTE — Discharge Instructions (Signed)
Please take your antibiotics as prescribed. Follow-up with your doctor next week for reevaluation. Return to ED for worsening symptoms as we discussed.  Community-Acquired Pneumonia, Adult Pneumonia is an infection of the lungs. There are different types of pneumonia. One type can develop while a person is in a hospital. A different type, called community-acquired pneumonia, develops in people who are not, or have not recently been, in the hospital or other health care facility.  CAUSES Pneumonia may be caused by bacteria, viruses, or funguses. Community-acquired pneumonia is often caused by Streptococcus pneumonia bacteria. These bacteria are often passed from one person to another by breathing in droplets from the cough or sneeze of an infected person. RISK FACTORS The condition is more likely to develop in:  People who havechronic diseases, such as chronic obstructive pulmonary disease (COPD), asthma, congestive heart failure, cystic fibrosis, diabetes, or kidney disease.  People who haveearly-stage or late-stage HIV.  People who havesickle cell disease.  People who havehad their spleen removed (splenectomy).  People who havepoor Administrator.  People who havemedical conditions that increase the risk of breathing in (aspirating) secretions their own mouth and nose.   People who havea weakened immune system (immunocompromised).  People who smoke.  People whotravel to areas where pneumonia-causing germs commonly exist.  People whoare around animal habitats or animals that have pneumonia-causing germs, including birds, bats, rabbits, cats, and farm animals. SYMPTOMS Symptoms of this condition include:  Adry cough.  A wet (productive) cough.  Fever.  Sweating.  Chest pain, especially when breathing deeply or coughing.  Rapid breathing or difficulty breathing.  Shortness of breath.  Shaking chills.  Fatigue.  Muscle aches. DIAGNOSIS Your health care  provider will take a medical history and perform a physical exam. You may also have other tests, including:  Imaging studies of your chest, including X-rays.  Tests to check your blood oxygen level and other blood gases.  Other tests on blood, mucus (sputum), fluid around your lungs (pleural fluid), and urine. If your pneumonia is severe, other tests may be done to identify the specific cause of your illness. TREATMENT The type of treatment that you receive depends on many factors, such as the cause of your pneumonia, the medicines you take, and other medical conditions that you have. For most adults, treatment and recovery from pneumonia may occur at home. In some cases, treatment must happen in a hospital. Treatment may include:  Antibiotic medicines, if the pneumonia was caused by bacteria.  Antiviral medicines, if the pneumonia was caused by a virus.  Medicines that are given by mouth or through an IV tube.  Oxygen.  Respiratory therapy. Although rare, treating severe pneumonia may include:  Mechanical ventilation. This is done if you are not breathing well on your own and you cannot maintain a safe blood oxygen level.  Thoracentesis. This procedureremoves fluid around one lung or both lungs to help you breathe better. HOME CARE INSTRUCTIONS  Take over-the-counter and prescription medicines only as told by your health care provider.  Only takecough medicine if you are losing sleep. Understand that cough medicine can prevent your body's natural ability to remove mucus from your lungs.  If you were prescribed an antibiotic medicine, take it as told by your health care provider. Do not stop taking the antibiotic even if you start to feel better.  Sleep in a semi-upright position at night. Try sleeping in a reclining chair, or place a few pillows under your head.  Do not use tobacco products,  including cigarettes, chewing tobacco, and e-cigarettes. If you need help quitting, ask  your health care provider.  Drink enough water to keep your urine clear or pale yellow. This will help to thin out mucus secretions in your lungs. PREVENTION There are ways that you can decrease your risk of developing community-acquired pneumonia. Consider getting a pneumococcal vaccine if:  You are older than 26 years of age.  You are older than 26 years of age and are undergoing cancer treatment, have chronic lung disease, or have other medical conditions that affect your immune system. Ask your health care provider if this applies to you. There are different types and schedules of pneumococcal vaccines. Ask your health care provider which vaccination option is best for you. You may also prevent community-acquired pneumonia if you take these actions:  Get an influenza vaccine every year. Ask your health care provider which type of influenza vaccine is best for you.  Go to the dentist on a regular basis.  Wash your hands often. Use hand sanitizer if soap and water are not available. SEEK MEDICAL CARE IF:  You have a fever.  You are losing sleep because you cannot control your cough with cough medicine. SEEK IMMEDIATE MEDICAL CARE IF:  You have worsening shortness of breath.  You have increased chest pain.  Your sickness becomes worse, especially if you are an older adult or have a weakened immune system.  You cough up blood.   This information is not intended to replace advice given to you by your health care provider. Make sure you discuss any questions you have with your health care provider.   Document Released: 03/19/2005 Document Revised: 12/08/2014 Document Reviewed: 07/14/2014 Elsevier Interactive Patient Education [employer]

## 2015-05-02 ENCOUNTER — Telehealth: Payer: Self-pay | Admitting: Family

## 2015-05-02 NOTE — Telephone Encounter (Signed)
Please contact pt to arrange a 1 week ED follow up with me.  

## 2015-05-02 NOTE — Telephone Encounter (Signed)
Left msg to schedule ER follow up °

## 2015-05-03 LAB — URINE CULTURE

## 2015-05-04 ENCOUNTER — Encounter: Payer: Self-pay | Admitting: Family

## 2015-05-04 ENCOUNTER — Ambulatory Visit (INDEPENDENT_AMBULATORY_CARE_PROVIDER_SITE_OTHER): Payer: PRIVATE HEALTH INSURANCE | Admitting: Family

## 2015-05-04 VITALS — BP 110/86 | HR 96 | Temp 98.3°F | Resp 18 | Ht 67.0 in | Wt 328.6 lb

## 2015-05-04 DIAGNOSIS — J189 Pneumonia, unspecified organism: Secondary | ICD-10-CM

## 2015-05-04 NOTE — Progress Notes (Signed)
Subjective:    Patient ID: Margaret Golden, female    DOB: 06-08-1990, 25 y.o.   MRN: [id number]  HPI  Margaret Golden is a 25 yr old female who presents today for follow up of pneumonia. She was evaluated in the ED on 05/01/15 and ED record is reviewed. She was diagnosed with PNA in the [facility].  She was treated with doxycycline x 7 days. She reports that overall improving.  Left sided chest pain resolving.  Denies fever. Continues to cough, she is not feeling that cough is bad enough to need a cough.  She did have nausea/vomitting.    Review of Systems See HPI  Past Medical History  Diagnosis Date  . History of chicken pox   . Depression   . History of kidney stones   . Thyroid disease     hypothyroid    Social History   Social History  . Marital Status: Single    Spouse Name: N/A  . Number of Children: N/A  . Years of Education: N/A   Occupational History  . Not on file.   Social History Main Topics  . Smoking status: Never Smoker   . Smokeless tobacco: Never Used  . Alcohol Use: No  . Drug Use: No  . Sexual Activity: Not on file   Other Topics Concern  . Not on file   Social History Narrative   Unemployed   Lives with Dad   Seeking work   Only child   Dog     Past Surgical History  Procedure Laterality Date  . Tonsillectomy  2010  . Knee arthroscopy Right 2007    Family History  Problem Relation Age of Onset  . Cancer Paternal Aunt     colon  . Cancer Paternal Uncle     "small cell cancer"  . Arthritis Maternal Grandmother   . Diabetes Maternal Grandfather   . Arthritis Maternal Grandfather   . Arthritis Paternal Grandmother   . Arthritis Paternal Grandfather     No Known Allergies  Current Outpatient Prescriptions on File Prior to Visit  Medication Sig Dispense Refill  . buPROPion (WELLBUTRIN SR) 150 MG 12 hr tablet Take 150 mg by mouth daily.    Marland Kitchen doxycycline (VIBRAMYCIN) 100 MG capsule Take 1 capsule (100 mg total) by mouth 2 (two) times daily.  One po bid x 7 days 14 capsule 0  . Ferrous Sulfate (IRON) 325 (65 Fe) MG TABS Take 1 tablet by mouth daily. 30 each 0  . hydrOXYzine (ATARAX/VISTARIL) 25 MG tablet Take 25 mg by mouth 3 (three) times daily.    Marland Kitchen levothyroxine (SYNTHROID, LEVOTHROID) 125 MCG tablet Take 1 tablet (125 mcg total) by mouth daily. 30 tablet 2  . Multiple Vitamins-Minerals (MULTIVITAMIN WITH MINERALS) tablet Take 1 tablet by mouth daily.    Marland Kitchen omeprazole (PRILOSEC) 20 MG capsule Take 1 capsule (20 mg total) by mouth daily. 90 capsule 1   No current facility-administered medications on file prior to visit.    BP 110/86 mmHg  Pulse 96  Temp(Src) 98.3 F (36.8 C) (Oral)  Resp 18  Ht  (1.702 m)  Wt 328 lb 9.6 oz (149.052 kg)  BMI 51.45 kg/m2  SpO2 99%  LMP 04/06/2015       Objective:   Physical Exam  Constitutional: She is oriented to person, place, and time. She appears well-developed and well-nourished.  HENT:  Right Ear: Tympanic membrane and ear canal normal.  Left Ear: Tympanic membrane and ear  canal normal.  Mouth/Throat: No oropharyngeal exudate, posterior oropharyngeal edema or posterior oropharyngeal erythema.  Cardiovascular: Normal rate, regular rhythm and normal heart sounds.   No murmur heard. Pulmonary/Chest: Effort normal and breath sounds normal. No respiratory distress. She has no wheezes.  Musculoskeletal: She exhibits no edema.  Lymphadenopathy:    She has no cervical adenopathy.  Neurological: She is alert and oriented to person, place, and time.  Psychiatric: She has a normal mood and affect. Her behavior is normal. Judgment and thought content normal.          Assessment & Plan:  CAP- clinically improving.  Advised pt to complete doxy, call if further N/V on doxy and we can consider another medication. Call if cough/symptoms do not continue to improve.

## 2015-05-04 NOTE — Patient Instructions (Signed)
Please complete doxycycline. Call if cough worsens or if it does not improve in 1-2 weeks.

## 2015-05-04 NOTE — Progress Notes (Signed)
Pre visit review using our clinic review tool, if applicable. No additional management support is needed unless otherwise documented below in the visit note. 

## 2015-05-13 ENCOUNTER — Other Ambulatory Visit: Payer: Self-pay | Admitting: Family

## 2015-05-13 ENCOUNTER — Ambulatory Visit (INDEPENDENT_AMBULATORY_CARE_PROVIDER_SITE_OTHER): Payer: PRIVATE HEALTH INSURANCE | Admitting: Physician Assistant

## 2015-05-13 ENCOUNTER — Encounter: Payer: Self-pay | Admitting: Physician Assistant

## 2015-05-13 VITALS — BP 125/87 | HR 98 | Temp 97.7°F | Ht 67.0 in | Wt 332.4 lb

## 2015-05-13 DIAGNOSIS — J189 Pneumonia, unspecified organism: Secondary | ICD-10-CM

## 2015-05-13 MED ORDER — BENZONATATE 100 MG PO CAPS: 100.0000 mg | ORAL_CAPSULE | Freq: Two times a day (BID) | ORAL | Status: DC | PRN

## 2015-05-13 MED ORDER — LEVOFLOXACIN 750 MG PO TABS: 750.0000 mg | ORAL_TABLET | Freq: Every day | ORAL | Status: DC

## 2015-05-13 NOTE — Progress Notes (Signed)
Patient with recent CAP treated with Doxycycline returns today complaining of continued chest congestion with productive cough and chest tenderness, left sided with coughing. Denies fever, chills, SOB or fatigue. Denies worsening symptoms but states they did not improve with antibiotic. CXR on 05/04/15 revealed CAP.   Past Medical History  Diagnosis Date  . History of chicken pox   . Depression   . History of kidney stones   . Thyroid disease     hypothyroid    Current Outpatient Prescriptions on File Prior to Visit  Medication Sig Dispense Refill  . buPROPion (WELLBUTRIN SR) 150 MG 12 hr tablet Take 150 mg by mouth daily.    . Ferrous Sulfate (IRON) 325 (65 Fe) MG TABS Take 1 tablet by mouth daily. 30 each 0  . hydrOXYzine (ATARAX/VISTARIL) 25 MG tablet Take 25 mg by mouth 3 (three) times daily.    Marland Kitchen levothyroxine (SYNTHROID, LEVOTHROID) 125 MCG tablet Take 1 tablet (125 mcg total) by mouth daily. 30 tablet 2  . Multiple Vitamins-Minerals (MULTIVITAMIN WITH MINERALS) tablet Take 1 tablet by mouth daily.    Marland Kitchen omeprazole (PRILOSEC) 20 MG capsule Take 1 capsule (20 mg total) by mouth daily. 90 capsule 1   No current facility-administered medications on file prior to visit.    No Known Allergies  Family History  Problem Relation Age of Onset  . Cancer Paternal Aunt     colon  . Cancer Paternal Uncle     "small cell cancer"  . Arthritis Maternal Grandmother   . Diabetes Maternal Grandfather   . Arthritis Maternal Grandfather   . Arthritis Paternal Grandmother   . Arthritis Paternal Grandfather     Social History   Social History  . Marital Status: Single    Spouse Name: N/A  . Number of Children: N/A  . Years of Education: N/A   Social History Main Topics  . Smoking status: Never Smoker   . Smokeless tobacco: Never Used  . Alcohol Use: No  . Drug Use: No  . Sexual Activity: Not Asked   Other Topics Concern  . None   Social History Narrative   Unemployed   Lives with Dad   Seeking work   Only child   Dog    Review of Systems - See HPI.  All other ROS are negative.  BP 125/87 mmHg  Pulse 98  Temp(Src) 97.7 F (36.5 C) (Oral)  Ht  (1.702 m)  Wt 332 lb 6.4 oz (150.776 kg)  BMI 52.05 kg/m2  SpO2 100%  LMP 05/09/2015  Physical Exam  Constitutional: She is well-developed, well-nourished, and in no distress.  HENT:  Head: Normocephalic and atraumatic.  Right Ear: External ear normal.  Left Ear: External ear normal.  Nose: Nose normal.  Mouth/Throat: Oropharynx is clear and moist. No oropharyngeal exudate.  Eyes: Conjunctivae are normal.  Neck: Neck supple.  Cardiovascular: Normal rate, regular rhythm, normal heart sounds and intact distal pulses.   Pulmonary/Chest: Effort normal and breath sounds normal. No respiratory distress. She has no wheezes. She has no rales. She exhibits no tenderness.  Skin: Skin is warm and dry. No rash noted.  Psychiatric: Affect normal.  Vitals reviewed.   Recent Results (from the past 2160 hour(s))  CBC with Differential/Platelet     Status: Abnormal   Collection Time: 04/13/15 11:11 AM  Result Value Ref Range   WBC 10.3 4.0 - 10.5 K/uL   RBC 5.64 (H) 3.87 - 5.11 Mil/uL   Hemoglobin 12.9 12.0 -  15.0 g/dL   HCT 32.9 51.8 - 84.1 %   MCV 72.4 (L) 78.0 - 100.0 fl   MCHC 31.5 30.0 - 36.0 g/dL   RDW 66.0 (H) 63.0 - 16.0 %   Platelets 245.0 150.0 - 400.0 K/uL   Neutrophils Relative % 70.7 43.0 - 77.0 %   Lymphocytes Relative 22.3 12.0 - 46.0 %   Monocytes Relative 4.5 3.0 - 12.0 %   Eosinophils Relative 2.1 0.0 - 5.0 %   Basophils Relative 0.4 0.0 - 3.0 %   Neutro Abs 7.3 1.4 - 7.7 K/uL   Lymphs Abs 2.3 0.7 - 4.0 K/uL   Monocytes Absolute 0.5 0.1 - 1.0 K/uL   Eosinophils Absolute 0.2 0.0 - 0.7 K/uL   Basophils Absolute 0.0 0.0 - 0.1 K/uL  Iron     Status: None   Collection Time: 04/13/15 11:11 AM  Result Value Ref Range   Iron 44 42 - 145 ug/dL  Urinalysis, Routine w reflex  microscopic-may I&O cath if menses (not at [facility])     Status: Abnormal   Collection Time: 05/01/15 10:15 AM  Result Value Ref Range   Color, Urine YELLOW YELLOW   APPearance CLOUDY (A) CLEAR   Specific Gravity, Urine 1.023 1.005 - 1.030   pH 5.5 5.0 - 8.0   Glucose, UA NEGATIVE NEGATIVE mg/dL   Hgb urine dipstick NEGATIVE NEGATIVE   Bilirubin Urine NEGATIVE NEGATIVE   Ketones, ur NEGATIVE NEGATIVE mg/dL   Protein, ur NEGATIVE NEGATIVE mg/dL   Nitrite NEGATIVE NEGATIVE   Leukocytes, UA TRACE (A) NEGATIVE  Pregnancy, urine     Status: None   Collection Time: 05/01/15 10:15 AM  Result Value Ref Range   Preg Test, Ur NEGATIVE NEGATIVE    Comment:        THE SENSITIVITY OF THIS METHODOLOGY IS >20 mIU/mL.   Urine microscopic-add on     Status: Abnormal   Collection Time: 05/01/15 10:15 AM  Result Value Ref Range   Squamous Epithelial / LPF 0-5 (A) NONE SEEN   WBC, UA 6-30 0 - 5 WBC/hpf   RBC / HPF NONE SEEN 0 - 5 RBC/hpf   Bacteria, UA MANY (A) NONE SEEN   Casts HYALINE CASTS (A) NEGATIVE    Comment: GRANULAR CAST   Urine-Other MUCOUS PRESENT   Urine culture     Status: None   Collection Time: 05/01/15 10:15 AM  Result Value Ref Range   Specimen Description URINE, CLEAN CATCH    Special Requests NONE    Culture      MULTIPLE SPECIES PRESENT, SUGGEST RECOLLECTION Performed at [facility]    Report Status 05/03/2015 FINAL     Assessment/Plan: CAP (community acquired pneumonia) Concern infection is still present. Will Rx Levaquin once daily for 7 days. Tessalon per orders. Supportive measures reviewed. Follow-up 1 week. ER if anything worsens.

## 2015-05-13 NOTE — Progress Notes (Signed)
Pre visit review using our clinic review tool, if applicable. No additional management support is needed unless otherwise documented below in the visit note. 

## 2015-05-13 NOTE — Patient Instructions (Signed)
Please take the Levaquin (antibiotic) as directed. Use OTC Mucinex (Plain) twice daily to thin congestion. Stay hydrated and take the cough medication as directed.  Follow-up with [facility] in 1 week.

## 2015-05-13 NOTE — Assessment & Plan Note (Signed)
Concern infection is still present. Will Rx Levaquin once daily for 7 days. Tessalon per orders. Supportive measures reviewed. Follow-up 1 week. ER if anything worsens.

## 2015-05-27 ENCOUNTER — Ambulatory Visit (HOSPITAL_BASED_OUTPATIENT_CLINIC_OR_DEPARTMENT_OTHER)
Admission: RE | Admit: 2015-05-27 | Discharge: 2015-05-27 | Disposition: A | Discharge status: Home / Self Care | Payer: PRIVATE HEALTH INSURANCE | Source: Ambulatory Visit | Attending: Family | Admitting: Family

## 2015-05-27 ENCOUNTER — Ambulatory Visit (INDEPENDENT_AMBULATORY_CARE_PROVIDER_SITE_OTHER): Payer: PRIVATE HEALTH INSURANCE | Admitting: Family

## 2015-05-27 VITALS — BP 140/82 | HR 98 | Temp 97.5°F | Ht 67.0 in | Wt 333.2 lb

## 2015-05-27 DIAGNOSIS — E039 Hypothyroidism, unspecified: Secondary | ICD-10-CM | POA: Diagnosis not present

## 2015-05-27 DIAGNOSIS — R110 Nausea: Secondary | ICD-10-CM | POA: Diagnosis not present

## 2015-05-27 DIAGNOSIS — J189 Pneumonia, unspecified organism: Secondary | ICD-10-CM

## 2015-05-27 DIAGNOSIS — R5382 Chronic fatigue, unspecified: Secondary | ICD-10-CM | POA: Diagnosis not present

## 2015-05-27 LAB — HEPATIC FUNCTION PANEL
ALT: 41 U/L — ABNORMAL HIGH (ref 6–29)
AST: 64 U/L — AB (ref 10–30)
Albumin: 4.1 g/dL (ref 3.6–5.1)
Alkaline Phosphatase: 79 U/L (ref 33–115)
BILIRUBIN DIRECT: 0.1 mg/dL (ref <=0.2)
BILIRUBIN TOTAL: 0.5 mg/dL (ref 0.2–1.2)
Indirect Bilirubin: 0.4 mg/dL (ref 0.2–1.2)
Total Protein: 7.3 g/dL (ref 6.1–8.1)

## 2015-05-27 LAB — CBC WITH DIFFERENTIAL/PLATELET
BASOS PCT: 0 % (ref 0–1)
Basophils Absolute: 0 10*3/uL (ref 0.0–0.1)
Eosinophils Absolute: 0.3 10*3/uL (ref 0.0–0.7)
Eosinophils Relative: 3 % (ref 0–5)
HCT: 40.6 % (ref 36.0–46.0)
HEMOGLOBIN: 13.1 g/dL (ref 12.0–15.0)
Lymphocytes Relative: 29 % (ref 12–46)
Lymphs Abs: 3.2 10*3/uL (ref 0.7–4.0)
MCH: 23.7 pg — ABNORMAL LOW (ref 26.0–34.0)
MCHC: 32.3 g/dL (ref 30.0–36.0)
MCV: 73.6 fL — ABNORMAL LOW (ref 78.0–100.0)
MONO ABS: 0.6 10*3/uL (ref 0.1–1.0)
MPV: 8.5 fL — ABNORMAL LOW (ref 8.6–12.4)
Monocytes Relative: 5 % (ref 3–12)
NEUTROS ABS: 7 10*3/uL (ref 1.7–7.7)
NEUTROS PCT: 63 % (ref 43–77)
PLATELETS: 226 10*3/uL (ref 150–400)
RBC: 5.52 MIL/uL — AB (ref 3.87–5.11)
RDW: 16 % — ABNORMAL HIGH (ref 11.5–15.5)
WBC: 11.1 10*3/uL — AB (ref 4.0–10.5)

## 2015-05-27 LAB — TSH: TSH: 8.65 m[IU]/L — AB

## 2015-05-27 NOTE — Patient Instructions (Signed)
Please complete lab work prior to leaving. Complete chest x ray on the first floor. Let me know if nausea and diarrhea worsens or does not improve.

## 2015-05-27 NOTE — Progress Notes (Signed)
Pre visit review using our clinic review tool, if applicable. No additional management support is needed unless otherwise documented below in the visit note. 

## 2015-05-27 NOTE — Progress Notes (Signed)
Subjective:    Patient ID: Margaret Golden, female    DOB: 11-02-1990, 25 y.o.   MRN: [id number]  HPI  Margaret Golden is a 25 yr old female who presents today for follow up of her CAP.  Pt completed doxy, saw Malva Cogan on 2/10 and was treated with levaquin for pneumonia. Overall has improved but still reports feeling weak and tired. Denies fevers.  Reports some residual chest congestion/cough.  Pleuritic pain has resolved.  She does report some mild nausea and diarrhea- though these symptoms are improving.   On the worse day it is 3 times a day. Thinks that her seasonal allergies are contributing.  Reports overall     Review of Systems See HPI  Past Medical History  Diagnosis Date  . History of chicken pox   . Depression   . History of kidney stones   . Thyroid disease     hypothyroid    Social History   Social History  . Marital Status: Single    Spouse Name: N/A  . Number of Children: N/A  . Years of Education: N/A   Occupational History  . Not on file.   Social History Main Topics  . Smoking status: Never Smoker   . Smokeless tobacco: Never Used  . Alcohol Use: No  . Drug Use: No  . Sexual Activity: Not on file   Other Topics Concern  . Not on file   Social History Narrative   Unemployed   Lives with Dad   Seeking work   Only child   Dog     Past Surgical History  Procedure Laterality Date  . Tonsillectomy  2010  . Knee arthroscopy Right 2007    Family History  Problem Relation Age of Onset  . Cancer Paternal Aunt     colon  . Cancer Paternal Uncle     "small cell cancer"  . Arthritis Maternal Grandmother   . Diabetes Maternal Grandfather   . Arthritis Maternal Grandfather   . Arthritis Paternal Grandmother   . Arthritis Paternal Grandfather     No Known Allergies  Current Outpatient Prescriptions on File Prior to Visit  Medication Sig Dispense Refill  . benzonatate (TESSALON) 100 MG capsule Take 1 capsule (100 mg total) by mouth 2 (two)  times daily as needed for cough. 20 capsule 0  . buPROPion (WELLBUTRIN SR) 150 MG 12 hr tablet Take 150 mg by mouth daily.    . Ferrous Sulfate (IRON) 325 (65 Fe) MG TABS Take 1 tablet by mouth daily. 30 each 0  . hydrOXYzine (ATARAX/VISTARIL) 25 MG tablet Take 25 mg by mouth 3 (three) times daily.    Marland Kitchen levofloxacin (LEVAQUIN) 750 MG tablet Take 1 tablet (750 mg total) by mouth daily. 7 tablet 0  . Multiple Vitamins-Minerals (MULTIVITAMIN WITH MINERALS) tablet Take 1 tablet by mouth daily.    Marland Kitchen omeprazole (PRILOSEC) 20 MG capsule Take 1 capsule (20 mg total) by mouth daily. 90 capsule 1   No current facility-administered medications on file prior to visit.    BP 140/82 mmHg  Pulse 98  Temp(Src) 97.5 F (36.4 C) (Oral)  Ht  (1.702 m)  Wt 333 lb 3.2 oz (151.139 kg)  BMI 52.17 kg/m2  SpO2 98%  LMP 05/09/2015       Objective:   Physical Exam  Constitutional: She is oriented to person, place, and time. She appears well-developed and well-nourished.  HENT:  Head: Normocephalic and atraumatic.  Cardiovascular: Normal rate, regular  rhythm and normal heart sounds.   No murmur heard. Pulmonary/Chest: Effort normal and breath sounds normal. No respiratory distress. She has no wheezes.  Neurological: She is alert and oriented to person, place, and time.  Skin: Skin is warm and dry.  Psychiatric: She has a normal mood and affect. Her behavior is normal. Judgment and thought content normal.          Assessment & Plan:  CAP- follow up CXR shows resolution of PNA. Advised pt to call if nausea and diarrhea does not continue to imprve.   Hypothyroid- TSH elevated, likely contributing to pt's c/o fatigue. Increase synthroid- see phone noted.

## 2015-05-28 ENCOUNTER — Telehealth: Payer: Self-pay | Admitting: Family

## 2015-05-28 MED ORDER — LEVOTHYROXINE SODIUM 150 MCG PO TABS: 150.0000 ug | ORAL_TABLET | Freq: Every day | ORAL | Status: DC

## 2015-05-28 NOTE — Telephone Encounter (Signed)
Lab work shows that thyroid medication needs to be increased. Increase synthroid to 150 mcg. LFT's remain elevated due to fatty liver.  Continue to work on diet, exercise, weight loss. See cxr note as well please.

## 2015-05-31 NOTE — Telephone Encounter (Signed)
Left detailed message on cell and to call if any questions. 

## 2015-08-02 ENCOUNTER — Encounter: Payer: Self-pay | Admitting: Family

## 2015-08-02 ENCOUNTER — Ambulatory Visit (INDEPENDENT_AMBULATORY_CARE_PROVIDER_SITE_OTHER): Payer: PRIVATE HEALTH INSURANCE | Admitting: Family

## 2015-08-02 VITALS — BP 110/86 | HR 94 | Temp 97.9°F | Resp 16 | Ht 67.0 in | Wt 339.8 lb

## 2015-08-02 DIAGNOSIS — D649 Anemia, unspecified: Secondary | ICD-10-CM

## 2015-08-02 DIAGNOSIS — E039 Hypothyroidism, unspecified: Secondary | ICD-10-CM | POA: Diagnosis not present

## 2015-08-02 DIAGNOSIS — F32A Depression, unspecified: Secondary | ICD-10-CM

## 2015-08-02 DIAGNOSIS — Z0000 Encounter for general adult medical examination without abnormal findings: Secondary | ICD-10-CM | POA: Diagnosis not present

## 2015-08-02 DIAGNOSIS — K219 Gastro-esophageal reflux disease without esophagitis: Secondary | ICD-10-CM

## 2015-08-02 DIAGNOSIS — G43809 Other migraine, not intractable, without status migrainosus: Secondary | ICD-10-CM

## 2015-08-02 DIAGNOSIS — F329 Major depressive disorder, single episode, unspecified: Secondary | ICD-10-CM

## 2015-08-02 LAB — CBC WITH DIFFERENTIAL/PLATELET
BASOS PCT: 0.3 % (ref 0.0–3.0)
Basophils Absolute: 0 10*3/uL (ref 0.0–0.1)
EOS ABS: 0.2 10*3/uL (ref 0.0–0.7)
EOS PCT: 1.8 % (ref 0.0–5.0)
HEMATOCRIT: 41.5 % (ref 36.0–46.0)
HEMOGLOBIN: 13.2 g/dL (ref 12.0–15.0)
LYMPHS PCT: 24.7 % (ref 12.0–46.0)
Lymphs Abs: 2.3 10*3/uL (ref 0.7–4.0)
MCHC: 31.8 g/dL (ref 30.0–36.0)
MCV: 70.8 fl — AB (ref 78.0–100.0)
Monocytes Absolute: 0.4 10*3/uL (ref 0.1–1.0)
Monocytes Relative: 4.7 % (ref 3.0–12.0)
NEUTROS ABS: 6.3 10*3/uL (ref 1.4–7.7)
Neutrophils Relative %: 68.5 % (ref 43.0–77.0)
PLATELETS: 251 10*3/uL (ref 150.0–400.0)
RBC: 5.86 Mil/uL — ABNORMAL HIGH (ref 3.87–5.11)
RDW: 16.6 % — AB (ref 11.5–15.5)
WBC: 9.1 10*3/uL (ref 4.0–10.5)

## 2015-08-02 LAB — VITAMIN B12: Vitamin B-12: 471 pg/mL (ref 211–911)

## 2015-08-02 LAB — TSH: TSH: 2.75 u[IU]/mL (ref 0.35–4.50)

## 2015-08-02 LAB — VITAMIN D 25 HYDROXY (VIT D DEFICIENCY, FRACTURES): VITD: 23.12 ng/mL — AB (ref 30.00–100.00)

## 2015-08-02 LAB — IRON: Iron: 36 ug/dL — ABNORMAL LOW (ref 42–145)

## 2015-08-02 MED ORDER — RANITIDINE HCL 150 MG PO TABS
150.0000 mg | ORAL_TABLET | Freq: Two times a day (BID) | ORAL | Status: DC
Start: 2015-08-02 — End: 2015-08-15

## 2015-08-02 NOTE — Assessment & Plan Note (Signed)
>>  ASSESSMENT AND PLAN FOR DEPRESSION WRITTEN ON 08/02/2015  1:39 PM BY O'SULLIVAN, Michaeline Eckersley, NP  Seems improved today. She is on wellbutrin. Management per psychiatry.

## 2015-08-02 NOTE — Assessment & Plan Note (Signed)
Obtain TSH, CBC, due to c/o "lethargy." continue synthroid.

## 2015-08-02 NOTE — Assessment & Plan Note (Signed)
Stable, will try transitioning her from prilosec to zantac.

## 2015-08-02 NOTE — Assessment & Plan Note (Signed)
Stable, monitor.  °

## 2015-08-02 NOTE — Assessment & Plan Note (Signed)
Seems improved today. She is on wellbutrin. Management per psychiatry.

## 2015-08-02 NOTE — Patient Instructions (Addendum)
Please complete lab work prior to leaving. Stop prilosec, start zantac twice daily. Let me know if your reflux symptoms worsen with this change/  Follow up in 6 months.

## 2015-08-02 NOTE — Progress Notes (Signed)
Pre visit review using our clinic review tool, if applicable. No additional management support is needed unless otherwise documented below in the visit note. 

## 2015-08-02 NOTE — Progress Notes (Signed)
Subjective:    Patient ID: Margaret Golden, female    DOB: 03/09/1991, 25 y.o.   MRN: [id number]  HPI  Margaret Golden is a 25 yr old female who presents today for follow up.   Migraines-  Denies recent migraines.   Hypothyroid- reports + lethargy. + compliance with synthroid.   Lab Results  Component Value Date   TSH 8.65* 05/27/2015   GERD- maintained on omeprazole. She reports that she has tried to goff in the past and has had recurrent symptoms.   Depression- currently maintained on wellbutrin.  Patient feels like she has some good days and bad days.  She is seeing psychiatry. She is seeing Jacinto ReapLesley O'Neil.  Psych is requesting vitamin D and b12. Reports that things are "moving in the right direction." She is getting closer to getting a job.   Anemia- Maintained on iron.  Lab Results  Component Value Date   WBC 11.1* 05/27/2015   HGB 13.1 05/27/2015   HCT 40.6 05/27/2015   MCV 73.6* 05/27/2015   PLT 226 05/27/2015     Review of Systems See HPI  Past Medical History  Diagnosis Date  . History of chicken pox   . Depression   . History of kidney stones   . Thyroid disease     hypothyroid     Social History   Social History  . Marital Status: Single    Spouse Name: N/A  . Number of Children: N/A  . Years of Education: N/A   Occupational History  . Not on file.   Social History Main Topics  . Smoking status: Never Smoker   . Smokeless tobacco: Never Used  . Alcohol Use: No  . Drug Use: No  . Sexual Activity: Not on file   Other Topics Concern  . Not on file   Social History Narrative   Unemployed   Lives with Dad   Seeking work   Only child   Dog     Past Surgical History  Procedure Laterality Date  . Tonsillectomy  2010  . Knee arthroscopy Right 2007    Family History  Problem Relation Age of Onset  . Cancer Paternal Aunt     colon  . Cancer Paternal Uncle     "small cell cancer"  . Arthritis Maternal Grandmother   . Diabetes Maternal  Grandfather   . Arthritis Maternal Grandfather   . Arthritis Paternal Grandmother   . Arthritis Paternal Grandfather     No Known Allergies  Current Outpatient Prescriptions on File Prior to Visit  Medication Sig Dispense Refill  . Ferrous Sulfate (IRON) 325 (65 Fe) MG TABS Take 1 tablet by mouth daily. 30 each 0  . levothyroxine (SYNTHROID, LEVOTHROID) 150 MCG tablet Take 1 tablet (150 mcg total) by mouth daily. 30 tablet 2  . Multiple Vitamins-Minerals (MULTIVITAMIN WITH MINERALS) tablet Take 1 tablet by mouth daily.     No current facility-administered medications on file prior to visit.    BP 110/86 mmHg  Pulse 94  Temp(Src) 97.9 F (36.6 C) (Oral)  Resp 16  Ht 5\' 7"  (1.702 m)  Wt 339 lb 12.8 oz (154.132 kg)  BMI 53.21 kg/m2  SpO2 98%  LMP 07/02/2015       Objective:   Physical Exam  Constitutional: She is oriented to person, place, and time. She appears well-developed and well-nourished.  Cardiovascular: Normal rate, regular rhythm and normal heart sounds.   No murmur heard. Pulmonary/Chest: Effort normal and breath sounds normal.  No respiratory distress. She has no wheezes.  Neurological: She is alert and oriented to person, place, and time.  Psychiatric: She has a normal mood and affect. Her behavior is normal. Judgment and thought content normal.  Mood seems improved today          Assessment & Plan:  Anemia- check follow up blood cough, iron, b12.

## 2015-08-03 ENCOUNTER — Telehealth: Payer: Self-pay | Admitting: Family

## 2015-08-03 MED ORDER — IRON 325 (65 FE) MG PO TABS: 1.0000 | ORAL_TABLET | Freq: Two times a day (BID) | ORAL | Status: DC

## 2015-08-03 MED ORDER — LEVOTHYROXINE SODIUM 150 MCG PO TABS: 150.0000 ug | ORAL_TABLET | Freq: Every day | ORAL | Status: DC

## 2015-08-03 NOTE — Telephone Encounter (Signed)
Repeat CBC, serum iron in 3 months, dx anemia.

## 2015-08-03 NOTE — Telephone Encounter (Signed)
Thyroid function looks good. Continue current dose of synthroid, refills sent.  Iron level is low. Increase iron from 1x to 2x daily. Due to her low energy/fatigue during the day, I am concerned re: possibility of OSA. Does she snore?  If so, I would like her to complete a home sleep study and will arrange.

## 2015-08-05 ENCOUNTER — Other Ambulatory Visit: Payer: Self-pay | Admitting: Family

## 2015-08-05 DIAGNOSIS — D649 Anemia, unspecified: Secondary | ICD-10-CM

## 2015-08-05 NOTE — Telephone Encounter (Signed)
Notified pt and she voices understanding. Denies snoring or daytime somnolence. Scheduled lab appt for 11/02/15 at 1:30pm, future order entered.

## 2015-08-15 ENCOUNTER — Telehealth: Payer: Self-pay | Admitting: Family

## 2015-08-15 MED ORDER — OMEPRAZOLE 20 MG PO CPDR: 20.0000 mg | DELAYED_RELEASE_CAPSULE | Freq: Every day | ORAL | Status: DC

## 2015-08-15 NOTE — Telephone Encounter (Signed)
Caller name: Self  Can be reached: [phone] Pharmacy:   [facility] - [place], [place] - [address] [phone] (Phone) [phone] (Fax)        Reason for call: Patient is requesting to switch back to Prilosec from Zantac. States that her Acid Reflux has gotten worse since she started on Zantac

## 2015-09-09 ENCOUNTER — Telehealth: Payer: Self-pay | Admitting: *Deleted

## 2015-09-09 NOTE — Telephone Encounter (Signed)
Pt signed ROI received via fax for labs. Printed and faxed successfully to [phone]

## 2015-09-20 ENCOUNTER — Encounter: Payer: Self-pay | Admitting: Physician Assistant

## 2015-09-20 ENCOUNTER — Ambulatory Visit (INDEPENDENT_AMBULATORY_CARE_PROVIDER_SITE_OTHER): Payer: PRIVATE HEALTH INSURANCE | Admitting: Physician Assistant

## 2015-09-20 VITALS — BP 112/86 | HR 103 | Temp 98.2°F | Resp 16 | Ht 67.0 in | Wt 342.2 lb

## 2015-09-20 DIAGNOSIS — J040 Acute laryngitis: Secondary | ICD-10-CM

## 2015-09-20 MED ORDER — PREDNISONE 20 MG PO TABS: 20.0000 mg | ORAL_TABLET | Freq: Every day | ORAL | Status: DC

## 2015-09-20 NOTE — Patient Instructions (Signed)
Please stay very well hydrated. Place a humidifier in the bedroom. Rest your voice. Take the steroid as directed for 3 days.  Follow-up if symptoms are not resolving.  Laryngitis Laryngitis is inflammation of your vocal cords. This causes hoarseness, coughing, loss of voice, sore throat, or a dry throat. Your vocal cords are two bands of muscles that are found in your throat. When you speak, these cords come together and vibrate. These vibrations come out through your mouth as sound. When your vocal cords are inflamed, your voice sounds different. Laryngitis can be temporary (acute) or long-term (chronic). Most cases of acute laryngitis improve with time. Chronic laryngitis is laryngitis that lasts for more than three weeks. CAUSES Acute laryngitis may be caused by:  A viral infection.  Lots of talking, yelling, or singing. This is also called vocal strain.  Bacterial infections. Chronic laryngitis may be caused by:  Vocal strain.  Injury to your vocal cords.  Acid reflux (gastroesophageal reflux disease or GERD).  Allergies.  Sinus infection.  Smoking.  Alcohol abuse.  Breathing in chemicals or dust.  Growths on the vocal cords. RISK FACTORS Risk factors for laryngitis include:  Smoking.  Alcohol abuse.  Having allergies. SIGNS AND SYMPTOMS Symptoms of laryngitis may include:  Low, hoarse voice.  Loss of voice.  Dry cough.  Sore throat.  Stuffy nose. DIAGNOSIS Laryngitis may be diagnosed by:  Physical exam.  Throat culture.  Blood test.  Laryngoscopy. This procedure allows your health care provider to look at your vocal cords with a mirror or viewing tube. TREATMENT Treatment for laryngitis depends on what is causing it. Usually, treatment involves resting your voice and using medicines to soothe your throat. However, if your laryngitis is caused by a bacterial infection, you may need to take antibiotic medicine. If your laryngitis is caused by a  growth, you may need to have a procedure to remove it. HOME CARE INSTRUCTIONS  Drink enough fluid to keep your urine clear or pale yellow.  Breathe in moist air. Use a humidifier if you live in a dry climate.  Take medicines only as directed by your health care provider.  If you were prescribed an antibiotic medicine, finish it all even if you start to feel better.  Do not smoke cigarettes or electronic cigarettes. If you need help quitting, ask your health care provider.  Talk as little as possible. Also avoid whispering, which can cause vocal strain.  Write instead of talking. Do this until your voice is back to normal. SEEK MEDICAL CARE IF:  You have a fever.  You have increasing pain.  You have difficulty swallowing. SEEK IMMEDIATE MEDICAL CARE IF:  You cough up blood.  You have trouble breathing.   This information is not intended to replace advice given to you by your health care provider. Make sure you discuss any questions you have with your health care provider.   Document Released: 03/19/2005 Document Revised: 04/09/2014 Document Reviewed: 09/01/2013 Elsevier Interactive Patient Education [employer]2016 Elsevier Inc.

## 2015-09-20 NOTE — Progress Notes (Signed)
Pre visit review using our clinic review tool, if applicable. No additional management support is needed unless otherwise documented below in the visit note/SLS  

## 2015-09-20 NOTE — Progress Notes (Signed)
Patient presents to clinic today c/o 1 week of mild dry cough and voice hoarseness. Did have a sore throat initially that has resolved. Does note nasal congestion. Denies sinus pressure, sinus pain, ear pain, facial pain or headaches. Denies recent travel. Denies known sick contact. Denies heart urn or indigestion. Denies shortness of breath. Patient has not take anything for symptoms.   Past Medical History  Diagnosis Date  . History of chicken pox   . Depression   . History of kidney stones   . Thyroid disease     hypothyroid    Current Outpatient Prescriptions on File Prior to Visit  Medication Sig Dispense Refill  . levothyroxine (SYNTHROID, LEVOTHROID) 150 MCG tablet Take 1 tablet (150 mcg total) by mouth daily. 30 tablet 5  . Multiple Vitamins-Minerals (MULTIVITAMIN WITH MINERALS) tablet Take 1 tablet by mouth daily.    Marland Kitchen omeprazole (PRILOSEC) 20 MG capsule Take 1 capsule (20 mg total) by mouth daily. 30 capsule 5   No current facility-administered medications on file prior to visit.    No Known Allergies  Family History  Problem Relation Age of Onset  . Cancer Paternal Aunt     colon  . Cancer Paternal Uncle     "small cell cancer"  . Arthritis Maternal Grandmother   . Diabetes Maternal Grandfather   . Arthritis Maternal Grandfather   . Arthritis Paternal Grandmother   . Arthritis Paternal Grandfather     Social History   Social History  . Marital Status: Single    Spouse Name: N/A  . Number of Children: N/A  . Years of Education: N/A   Social History Main Topics  . Smoking status: Never Smoker   . Smokeless tobacco: Never Used  . Alcohol Use: No  . Drug Use: No  . Sexual Activity: Not Asked   Other Topics Concern  . None   Social History Narrative   Unemployed   Lives with Dad   Seeking work   Only child   Dog    Review of Systems - See HPI.  All other ROS are negative.  BP 112/86 mmHg  Pulse 103  Temp(Src) 98.2 F (36.8 C) (Oral)  Resp  16  Ht  (1.702 m)  Wt 342 lb 4 oz (155.244 kg)  BMI 53.59 kg/m2  SpO2 98%  LMP 09/06/2015  Physical Exam  Recent Results (from the past 2160 hour(s))  TSH     Status: None   Collection Time: 08/02/15  1:40 PM  Result Value Ref Range   TSH 2.75 0.35 - 4.50 uIU/mL  CBC w/Diff     Status: Abnormal   Collection Time: 08/02/15  1:40 PM  Result Value Ref Range   WBC 9.1 4.0 - 10.5 K/uL   RBC 5.86 (H) 3.87 - 5.11 Mil/uL   Hemoglobin 13.2 12.0 - 15.0 g/dL   HCT 16.1 09.6 - 04.5 %   MCV 70.8 (L) 78.0 - 100.0 fl   MCHC 31.8 30.0 - 36.0 g/dL   RDW 40.9 (H) 81.1 - 91.4 %   Platelets 251.0 150.0 - 400.0 K/uL   Neutrophils Relative % 68.5 43.0 - 77.0 %   Lymphocytes Relative 24.7 12.0 - 46.0 %   Monocytes Relative 4.7 3.0 - 12.0 %   Eosinophils Relative 1.8 0.0 - 5.0 %   Basophils Relative 0.3 0.0 - 3.0 %   Neutro Abs 6.3 1.4 - 7.7 K/uL   Lymphs Abs 2.3 0.7 - 4.0 K/uL   Monocytes  Absolute 0.4 0.1 - 1.0 K/uL   Eosinophils Absolute 0.2 0.0 - 0.7 K/uL   Basophils Absolute 0.0 0.0 - 0.1 K/uL  B12     Status: None   Collection Time: 08/02/15  1:40 PM  Result Value Ref Range   Vitamin B-12 471 211 - 911 pg/mL  Vitamin D (25 hydroxy)     Status: Abnormal   Collection Time: 08/02/15  1:40 PM  Result Value Ref Range   VITD 23.12 (L) 30.00 - 100.00 ng/mL  Iron     Status: Abnormal   Collection Time: 08/02/15  1:40 PM  Result Value Ref Range   Iron 36 (L) 42 - 145 ug/dL    Assessment/Plan: 1. Laryngitis Voice rest. Hydration. Humidifier in the bedroom. Rx low-dose Prednisone 20 mg daily x 3 days to help calm inflammation of cords. FU if not resolving.  - predniSONE (DELTASONE) 20 MG tablet; Take 1 tablet (20 mg total) by mouth daily with breakfast.  Dispense: 3 tablet; Refill: 0   Piedad ClimesMartin, Suzana Sohail Cody, [place]

## 2015-09-28 ENCOUNTER — Encounter: Payer: Self-pay | Admitting: Internal Medicine

## 2015-09-28 ENCOUNTER — Ambulatory Visit (INDEPENDENT_AMBULATORY_CARE_PROVIDER_SITE_OTHER): Payer: PRIVATE HEALTH INSURANCE | Admitting: Internal Medicine

## 2015-09-28 VITALS — BP 108/78 | HR 103 | Temp 98.3°F | Ht 67.0 in | Wt 343.2 lb

## 2015-09-28 DIAGNOSIS — R490 Dysphonia: Secondary | ICD-10-CM

## 2015-09-28 MED ORDER — AMOXICILLIN 500 MG PO CAPS: 1000.0000 mg | ORAL_CAPSULE | Freq: Two times a day (BID) | ORAL | Status: DC

## 2015-09-28 MED ORDER — OMEPRAZOLE 20 MG PO CPDR: 40.0000 mg | DELAYED_RELEASE_CAPSULE | Freq: Every day | ORAL | Status: DC

## 2015-09-28 NOTE — Patient Instructions (Signed)
Will refer you to the ENT doctor    Food Choices for Gastroesophageal Reflux Disease, Adult When you have gastroesophageal reflux disease (GERD), the foods you eat and your eating habits are very important. Choosing the right foods can help ease the discomfort of GERD. WHAT GENERAL GUIDELINES DO I NEED TO FOLLOW?  Choose fruits, vegetables, whole grains, low-fat dairy products, and low-fat meat, fish, and poultry.  Limit fats such as oils, salad dressings, butter, nuts, and avocado.  Keep a food diary to identify foods that cause symptoms.  Avoid foods that cause reflux. These may be different for different people.  Eat frequent small meals instead of three large meals each day.  Eat your meals slowly, in a relaxed setting.  Limit fried foods.  Cook foods using methods other than frying.  Avoid drinking alcohol.  Avoid drinking large amounts of liquids with your meals.  Avoid bending over or lying down until 2-3 hours after eating. WHAT FOODS ARE NOT RECOMMENDED? The following are some foods and drinks that may worsen your symptoms: Vegetables Tomatoes. Tomato juice. Tomato and spaghetti sauce. Chili peppers. Onion and garlic. Horseradish. Fruits Oranges, grapefruit, and lemon (fruit and juice). Meats High-fat meats, fish, and poultry. This includes hot dogs, ribs, ham, sausage, salami, and bacon. Dairy Whole milk and chocolate milk. Sour cream. Cream. Butter. Ice cream. Cream cheese.  Beverages Coffee and tea, with or without caffeine. Carbonated beverages or energy drinks. Condiments Hot sauce. Barbecue sauce.  Sweets/Desserts Chocolate and cocoa. Donuts. Peppermint and spearmint. Fats and Oils High-fat foods, including [place] fries and potato chips. Other Vinegar. Strong spices, such as black pepper, white pepper, red pepper, cayenne, curry powder, cloves, ginger, and chili powder. The items listed above may not be a complete list of foods and beverages to avoid.  Contact your dietitian for more information.   This information is not intended to replace advice given to you by your health care provider. Make sure you discuss any questions you have with your health care provider.   Document Released: 03/19/2005 Document Revised: 04/09/2014 Document Reviewed: 01/21/2013 Elsevier Interactive Patient Education [employer]2016 Elsevier Inc.

## 2015-09-28 NOTE — Progress Notes (Signed)
Pre visit review using our clinic review tool, if applicable. No additional management support is needed unless otherwise documented below in the visit note. 

## 2015-09-28 NOTE — Progress Notes (Signed)
Subjective:    Patient ID: Margaret Golden, female    DOB: 01/26/1991, 25 y.o.   MRN: [id number]  DOS:  09/28/2015 Type of visit - description : Acute Interval history:  3 weeks ago, Margaret Golden woke up with a severe burning at the throat, the next day Margaret Golden developed cough and some hoarseness. Was seen at this office 09/20/2015, diagnosed with laryngitis, rx  prednisone. Cough is gone but Margaret Golden continue w/ hoarseness   Review of Systems  Denies fever chills. No itchy eyes, itchy nose or sneezing. Minimal sinus congestion and very mild postnasal dripping. No nausea, vomiting, diarrhea No difficulty swallowing, no dysphagia or odynophagia. GERD: Margaret Golden takes PPIs once a day after breakfast, symptoms relatively well controlled except for 3 weeks ago.  Past Medical History  Diagnosis Date  . History of chicken pox   . Depression   . History of kidney stones   . Thyroid disease     hypothyroid    Past Surgical History  Procedure Laterality Date  . Tonsillectomy  2010  . Knee arthroscopy Right 2007    Social History   Social History  . Marital Status: Single    Spouse Name: N/A  . Number of Children: N/A  . Years of Education: N/A   Occupational History  . Not on file.   Social History Main Topics  . Smoking status: Never Smoker   . Smokeless tobacco: Never Used  . Alcohol Use: No  . Drug Use: No  . Sexual Activity: Not on file   Other Topics Concern  . Not on file   Social History Narrative   Unemployed   Lives with Dad   Seeking work   Only child   Dog         Medication List       This list is accurate as of: 09/28/15 11:59 PM.  Always use your most recent med list.               amoxicillin 500 MG capsule  Commonly known as:  AMOXIL  Take 2 capsules (1,000 mg total) by mouth 2 (two) times daily.     Ferrous Sulfate 28 MG Tabs  Take 2 tablets by mouth daily.     lamoTRIgine 25 MG tablet  Commonly known as:  LAMICTAL  Take 25 mg by mouth 2 (two)  times daily.     levothyroxine 150 MCG tablet  Commonly known as:  SYNTHROID, LEVOTHROID  Take 1 tablet (150 mcg total) by mouth daily.     multivitamin with minerals tablet  Take 1 tablet by mouth daily.     omeprazole 20 MG capsule  Commonly known as:  PRILOSEC  Take 2 capsules (40 mg total) by mouth daily.           Objective:   Physical Exam BP 108/78 mmHg  Pulse 103  Temp(Src) 98.3 F (36.8 C) (Oral)  Ht 5\' 7"  (1.702 m)  Wt 343 lb 4 oz (155.697 kg)  BMI 53.75 kg/m2  SpO2 95%  LMP 09/06/2015 General:   Well developed, well nourished . NAD.  HEENT:  Normocephalic . Face symmetric, atraumatic. TMs normal. Throat: Symmetric, tonsils absent, no red, no swelling. Margaret Golden is worse. Neck: No LADs, no mass. No thyromegaly Lungs:  CTA B Normal respiratory effort, no intercostal retractions, no accessory muscle use. Heart: RRR,  no murmur.  No pretibial edema bilaterally  Skin: Not pale. Not jaundice Neurologic:  alert & oriented X3.  Speech normal,  gait appropriate for age and unassisted Psych--  Cognition and judgment appear intact.  Cooperative with normal attention span and concentration.  Behavior appropriate. No anxious or depressed appearing.      Assessment & Plan:   Hoarseness: 25 year old lady, nonsmoker, presents with persistent hoarseness after episode of heartburn, status post prednisone. I suspect her symptoms are mostly GERD related, could be also an infection process but that is less likely. Plan: ENT, increased PPIs to 2 tablets daily before breakfast, amoxicillin for one week. Call if not better.

## 2015-10-05 ENCOUNTER — Ambulatory Visit: Payer: PRIVATE HEALTH INSURANCE | Admitting: Medical

## 2015-11-02 ENCOUNTER — Other Ambulatory Visit (INDEPENDENT_AMBULATORY_CARE_PROVIDER_SITE_OTHER): Payer: PRIVATE HEALTH INSURANCE

## 2015-11-02 DIAGNOSIS — D649 Anemia, unspecified: Secondary | ICD-10-CM

## 2015-11-02 LAB — CBC WITH DIFFERENTIAL/PLATELET
BASOS ABS: 0 10*3/uL (ref 0.0–0.1)
Basophils Relative: 0.3 % (ref 0.0–3.0)
EOS ABS: 0.2 10*3/uL (ref 0.0–0.7)
Eosinophils Relative: 2.3 % (ref 0.0–5.0)
HEMATOCRIT: 38.9 % (ref 36.0–46.0)
HEMOGLOBIN: 12.9 g/dL (ref 12.0–15.0)
LYMPHS PCT: 26.7 % (ref 12.0–46.0)
Lymphs Abs: 2.5 10*3/uL (ref 0.7–4.0)
MCHC: 33.3 g/dL (ref 30.0–36.0)
MCV: 81.1 fl (ref 78.0–100.0)
MONOS PCT: 4.1 % (ref 3.0–12.0)
Monocytes Absolute: 0.4 10*3/uL (ref 0.1–1.0)
Neutro Abs: 6.2 10*3/uL (ref 1.4–7.7)
Neutrophils Relative %: 66.6 % (ref 43.0–77.0)
PLATELETS: 203 10*3/uL (ref 150.0–400.0)
RBC: 4.79 Mil/uL (ref 3.87–5.11)
RDW: 15.3 % (ref 11.5–15.5)
WBC: 9.3 10*3/uL (ref 4.0–10.5)

## 2015-11-05 ENCOUNTER — Encounter: Payer: Self-pay | Admitting: Family

## 2015-11-07 ENCOUNTER — Ambulatory Visit (INDEPENDENT_AMBULATORY_CARE_PROVIDER_SITE_OTHER): Payer: PRIVATE HEALTH INSURANCE | Admitting: Family

## 2015-11-07 ENCOUNTER — Encounter: Payer: Self-pay | Admitting: Family

## 2015-11-07 VITALS — BP 120/78 | HR 86 | Temp 97.8°F | Resp 16 | Ht 67.0 in | Wt 361.6 lb

## 2015-11-07 DIAGNOSIS — G4733 Obstructive sleep apnea (adult) (pediatric): Secondary | ICD-10-CM | POA: Diagnosis not present

## 2015-11-07 DIAGNOSIS — R202 Paresthesia of skin: Secondary | ICD-10-CM | POA: Diagnosis not present

## 2015-11-07 DIAGNOSIS — R635 Abnormal weight gain: Secondary | ICD-10-CM

## 2015-11-07 DIAGNOSIS — R609 Edema, unspecified: Secondary | ICD-10-CM | POA: Diagnosis not present

## 2015-11-07 MED ORDER — FUROSEMIDE 20 MG PO TABS: 20.0000 mg | ORAL_TABLET | Freq: Every day | ORAL | 0 refills | Status: DC

## 2015-11-07 NOTE — Patient Instructions (Signed)
Please take lasix 1 tab once daily for the next 3 days. Then only take as needed.   Purchase some compression knee highs over the counter and put them on first thing in the AM. Limit your sodium intake and make sure you are drinking enough water. Stop sodas.   Try to add regular walking each day. You will be contacted about your referral to the nutrition.

## 2015-11-07 NOTE — Progress Notes (Signed)
Pre visit review using our clinic review tool, if applicable. No additional management support is needed unless otherwise documented below in the visit note. 

## 2015-11-07 NOTE — Progress Notes (Signed)
Subjective:    Patient ID: Margaret Golden, female    DOB: 23-Sep-1990, 25 y.o.   MRN: [id number]  HPI  Margaret Golden is a 25 yr old female who presents today with chief complaint of Edema. She reports swelling of both feet L>R which began a few days ago.  Reports that she first noticed on Saturday. Denies pain in legs or calf pain. Notes some ankle pain.  She admits to dietary indiscretion.   Denies recent travel.  Denies CP or SOB.   Wt Readings from Last 3 Encounters:  11/07/15 (!) 361 lb 9.6 oz (164 kg)  09/28/15 (!) 343 lb 4 oz (155.7 kg)  09/20/15 (!) 342 lb 4 oz (155.2 kg)   Depression- she is following with [facility] and notes that she saw her a few weeks ago.  Lamictal was increased and she was placed on propranolol for anxiety and blood pressure.  Reports good compliance with synthroid.  Lab Results  Component Value Date   TSH 2.75 08/02/2015    Some tingling in her feet.      Review of Systems  + daytime somnolence "always tired."   Past Medical History:  Diagnosis Date  . Depression   . History of chicken pox   . History of kidney stones   . Thyroid disease    hypothyroid     Social History   Social History  . Marital status: Single    Spouse name: N/A  . Number of children: N/A  . Years of education: N/A   Occupational History  . Not on file.   Social History Main Topics  . Smoking status: Never Smoker  . Smokeless tobacco: Never Used  . Alcohol use No  . Drug use: No  . Sexual activity: Not on file   Other Topics Concern  . Not on file   Social History Narrative   Unemployed   Lives with Dad   Seeking work   Only child   Dog     Past Surgical History:  Procedure Laterality Date  . KNEE ARTHROSCOPY Right 2007  . TONSILLECTOMY  2010    Family History  Problem Relation Age of Onset  . Cancer Paternal Aunt     colon  . Cancer Paternal Uncle     "small cell cancer"  . Arthritis Maternal Grandmother   . Diabetes Maternal  Grandfather   . Arthritis Maternal Grandfather   . Arthritis Paternal Grandmother   . Arthritis Paternal Grandfather     No Known Allergies  Current Outpatient Prescriptions on File Prior to Visit  Medication Sig Dispense Refill  . Ferrous Sulfate 28 MG TABS Take 2 tablets by mouth daily.    Marland Kitchen levothyroxine (SYNTHROID, LEVOTHROID) 150 MCG tablet Take 1 tablet (150 mcg total) by mouth daily. 30 tablet 5  . Multiple Vitamins-Minerals (MULTIVITAMIN WITH MINERALS) tablet Take 1 tablet by mouth daily.    Marland Kitchen omeprazole (PRILOSEC) 20 MG capsule Take 2 capsules (40 mg total) by mouth daily. 60 capsule 2   No current facility-administered medications on file prior to visit.     BP 120/78   Pulse 86   Temp 97.8 F (36.6 C)   Resp 16   Ht  (1.702 m)   Wt (!) 361 lb 9.6 oz (164 kg)   LMP 11/03/2015   SpO2 98%   BMI 56.63 kg/m    Objective:   Physical Exam  Constitutional: She is oriented to person, place, and time. She appears  well-developed and well-nourished.  Cardiovascular: Normal rate, regular rhythm and normal heart sounds.   No murmur heard. 2+ bilateral LE edema   Pulmonary/Chest: Effort normal and breath sounds normal. No respiratory distress. She has no wheezes.  Neurological: She is alert and oriented to person, place, and time.  Psychiatric: She has a normal mood and affect. Her behavior is normal. Judgment and thought content normal.          Assessment & Plan:  Edema- Differential includes CHF (will send for echo and sleep study), venous insufficiency.  Clinically doubt DVT.  Will add lasix once daily for next 3 days.  Then PRN.  Would like to see an albumin as well to make sure low albumin is not playing a role in her Edema. Advised low sodium diet, elevation and compression stockings.   OSA- upon further questioning, pt states that she had a sleep study 3-4 years ago which showed OSA. Will send for split night study.  Morbid obesity- Discussed bariatric  referral. She wants to "avoid surgery at all costs." She is agreeable to begin walking and to meet with the nutritionist (never followed through with referral last fall).    Paresthesias- check b12, folate.  Weight gain- check TSH.    Depression- not optimized, being managed by psychiatry.

## 2015-11-08 ENCOUNTER — Other Ambulatory Visit: Payer: PRIVATE HEALTH INSURANCE

## 2015-11-08 LAB — TSH: TSH: 0.34 u[IU]/mL — ABNORMAL LOW (ref 0.35–4.50)

## 2015-11-08 LAB — COMPREHENSIVE METABOLIC PANEL
ALBUMIN: 3.8 g/dL (ref 3.5–5.2)
ALT: 50 U/L — AB (ref 0–35)
AST: 78 U/L — AB (ref 0–37)
Alkaline Phosphatase: 69 U/L (ref 39–117)
BILIRUBIN TOTAL: 0.5 mg/dL (ref 0.2–1.2)
BUN: 9 mg/dL (ref 6–23)
CALCIUM: 8.9 mg/dL (ref 8.4–10.5)
CO2: 26 meq/L (ref 19–32)
CREATININE: 0.68 mg/dL (ref 0.40–1.20)
Chloride: 106 mEq/L (ref 96–112)
GFR: 111.46 mL/min (ref >60.00)
Glucose, Bld: 97 mg/dL (ref 70–99)
Potassium: 4.1 mEq/L (ref 3.5–5.1)
Sodium: 139 mEq/L (ref 135–145)
Total Protein: 6.9 g/dL (ref 6.0–8.3)

## 2015-11-08 LAB — FOLATE: Folate: >23.7 ng/mL (ref >5.9)

## 2015-11-08 LAB — VITAMIN B12: VITAMIN B 12: 510 pg/mL (ref 211–911)

## 2015-11-09 ENCOUNTER — Telehealth: Payer: Self-pay | Admitting: Family

## 2015-11-09 DIAGNOSIS — E039 Hypothyroidism, unspecified: Secondary | ICD-10-CM

## 2015-11-09 MED ORDER — LEVOTHYROXINE SODIUM 125 MCG PO TABS: 125.0000 ug | ORAL_TABLET | Freq: Every day | ORAL | 2 refills | Status: DC

## 2015-11-09 NOTE — Telephone Encounter (Signed)
Left message for pt to return my call.

## 2015-11-09 NOTE — Telephone Encounter (Signed)
Please let pt know that lab work shows that we need to decrease synthroid dose form 150 to daily. Repeat tsh in 6 weeks, dx hypothyroid. b12 and folate look good.  Liver function testing remains abnormal.  Likely due to Fatty liver, please work on low fat/low cholesterol diet, exercise and weight loss.

## 2015-11-11 NOTE — Telephone Encounter (Signed)
Notified pt and scheduled lab appt for 12/23/15 and future order has been entered.

## 2015-11-23 ENCOUNTER — Ambulatory Visit (HOSPITAL_BASED_OUTPATIENT_CLINIC_OR_DEPARTMENT_OTHER)
Admission: RE | Admit: 2015-11-23 | Discharge: 2015-11-23 | Disposition: A | Discharge status: Home / Self Care | Payer: PRIVATE HEALTH INSURANCE | Source: Ambulatory Visit | Attending: Family | Admitting: Family

## 2015-11-23 DIAGNOSIS — Z6841 Body Mass Index (BMI) 40.0 and over, adult: Secondary | ICD-10-CM | POA: Diagnosis not present

## 2015-11-23 DIAGNOSIS — R609 Edema, unspecified: Secondary | ICD-10-CM

## 2015-11-23 MED FILL — Perflutren Lipid Microsphere IV Susp 1.1 MG/ML: INTRAVENOUS | Qty: 10 | Status: CN

## 2015-11-23 NOTE — Progress Notes (Addendum)
  Echocardiogram 2D Echocardiogram has been performed. IV access was attempted by Desiree HaneJoss Benton, RN without success. Definity( ultrasound contrast) was not used.  Dorothey BasemanReel, Badr Piedra M 11/23/2015, 9:20 AM

## 2015-11-28 ENCOUNTER — Encounter: Payer: Self-pay | Admitting: Family

## 2015-11-28 ENCOUNTER — Ambulatory Visit (INDEPENDENT_AMBULATORY_CARE_PROVIDER_SITE_OTHER): Payer: PRIVATE HEALTH INSURANCE | Admitting: Family

## 2015-11-28 VITALS — BP 112/48 | HR 86 | Temp 98.4°F | Resp 17 | Ht 67.0 in | Wt 357.0 lb

## 2015-11-28 DIAGNOSIS — R609 Edema, unspecified: Secondary | ICD-10-CM

## 2015-11-28 DIAGNOSIS — Z23 Encounter for immunization: Secondary | ICD-10-CM | POA: Diagnosis not present

## 2015-11-28 NOTE — Progress Notes (Signed)
Subjective:    Patient ID: Margaret Golden, female    DOB: 11/23/1990, 25 y.o.   MRN: [id number]  HPI  Margaret Golden is a 25 yr old female who presents today with chief complaint of foot swelling.  She first reported this last visit and she was sent for echo and sleep study to rule out OSA. We also added lasix once daily for 3 days then as needed. She was advised on a low sodium diet, elevation and compression stockings.  2D echo noted normal LVEF. She reports that she took 3 days of lasix, edema resolved and has not returned.   Wt Readings from Last 3 Encounters:  11/28/15 (!) 357 lb (161.9 kg)  11/07/15 (!) 361 lb 9.6 oz (164 kg)  09/28/15 (!) 343 lb 4 oz (155.7 kg)     Review of Systems See HPI  Past Medical History:  Diagnosis Date  . Depression   . History of chicken pox   . History of kidney stones   . Thyroid disease    hypothyroid     Social History   Social History  . Marital status: Single    Spouse name: N/A  . Number of children: N/A  . Years of education: N/A   Occupational History  . Not on file.   Social History Main Topics  . Smoking status: Never Smoker  . Smokeless tobacco: Never Used  . Alcohol use No  . Drug use: No  . Sexual activity: Not on file   Other Topics Concern  . Not on file   Social History Narrative   Unemployed   Lives with Dad   Seeking work   Only child   Dog     Past Surgical History:  Procedure Laterality Date  . KNEE ARTHROSCOPY Right 2007  . TONSILLECTOMY  2010    Family History  Problem Relation Age of Onset  . Cancer Paternal Aunt     colon  . Cancer Paternal Uncle     "small cell cancer"  . Arthritis Maternal Grandmother   . Diabetes Maternal Grandfather   . Arthritis Maternal Grandfather   . Arthritis Paternal Grandmother   . Arthritis Paternal Grandfather     No Known Allergies  Current Outpatient Prescriptions on File Prior to Visit  Medication Sig Dispense Refill  . Ferrous Sulfate 28 MG TABS Take  2 tablets by mouth daily.    . furosemide (LASIX) 20 MG tablet Take 1 tablet (20 mg total) by mouth daily. 30 tablet 0  . lamoTRIgine (LAMICTAL) 100 MG tablet Take 200 mg by mouth daily.     Marland Kitchen. levothyroxine (SYNTHROID, LEVOTHROID) 125 MCG tablet Take 1 tablet (125 mcg total) by mouth daily. 30 tablet 2  . Multiple Vitamins-Minerals (MULTIVITAMIN WITH MINERALS) tablet Take 1 tablet by mouth daily.    Marland Kitchen. omeprazole (PRILOSEC) 20 MG capsule Take 2 capsules (40 mg total) by mouth daily. 60 capsule 2  . propranolol (INDERAL) 60 MG tablet Take 60 mg by mouth daily.     No current facility-administered medications on file prior to visit.     BP (!) 112/48 (BP Location: Right Arm, Patient Position: Sitting, Cuff Size: Large)   Pulse 86   Temp 98.4 F (36.9 C) (Oral)   Resp 17   Ht 5\' 7"  (1.702 m)   Wt (!) 357 lb (161.9 kg)   LMP 11/24/2015   SpO2 99%   BMI 55.91 kg/m       Objective:   Physical  Exam  Constitutional: She is oriented to person, place, and time. She appears well-developed and well-nourished.  Cardiovascular: Normal rate, regular rhythm and normal heart sounds.   No murmur heard. Pulmonary/Chest: Effort normal and breath sounds normal. No respiratory distress. She has no wheezes.  Musculoskeletal: She exhibits no edema.  Neurological: She is alert and oriented to person, place, and time.  Psychiatric: She has a normal mood and affect. Her behavior is normal. Judgment and thought content normal.          Assessment & Plan:  Edema- resolved. I suspect that untreated sleep apnea is a contributing factor. Await results from upcoming sleep study.

## 2015-11-28 NOTE — Progress Notes (Signed)
Pre visit review using our clinic review tool, if applicable. No additional management support is needed unless otherwise documented below in the visit note. 

## 2015-11-28 NOTE — Patient Instructions (Signed)
We will let you know how your sleep study looks and make further recommendations once complete. Please let me know if you have recurrent leg swelling.

## 2015-11-30 ENCOUNTER — Encounter (INDEPENDENT_AMBULATORY_CARE_PROVIDER_SITE_OTHER): Payer: Self-pay

## 2015-11-30 ENCOUNTER — Encounter: Payer: Self-pay | Admitting: Medical

## 2015-11-30 ENCOUNTER — Ambulatory Visit (HOSPITAL_BASED_OUTPATIENT_CLINIC_OR_DEPARTMENT_OTHER)
Admission: RE | Admit: 2015-11-30 | Discharge: 2015-11-30 | Disposition: A | Discharge status: Home / Self Care | Payer: No Typology Code available for payment source | Source: Ambulatory Visit | Attending: Medical | Admitting: Medical

## 2015-11-30 ENCOUNTER — Ambulatory Visit (INDEPENDENT_AMBULATORY_CARE_PROVIDER_SITE_OTHER): Payer: PRIVATE HEALTH INSURANCE | Admitting: Medical

## 2015-11-30 VITALS — BP 125/80 | HR 95 | Temp 97.7°F | Wt 356.2 lb

## 2015-11-30 DIAGNOSIS — R600 Localized edema: Secondary | ICD-10-CM | POA: Diagnosis not present

## 2015-11-30 DIAGNOSIS — M79609 Pain in unspecified limb: Secondary | ICD-10-CM

## 2015-11-30 DIAGNOSIS — N921 Excessive and frequent menstruation with irregular cycle: Secondary | ICD-10-CM | POA: Diagnosis not present

## 2015-11-30 DIAGNOSIS — R5383 Other fatigue: Secondary | ICD-10-CM

## 2015-11-30 MED ORDER — MEDROXYPROGESTERONE ACETATE 10 MG PO TABS: 10.0000 mg | ORAL_TABLET | Freq: Every day | ORAL | 0 refills | Status: DC

## 2015-11-30 NOTE — Progress Notes (Signed)
Subjective:    Patient ID: Margaret Golden, female    DOB: 1990/12/05, 25 y.o.   MRN: [id number]  HPI  Pt in with both feet swollen. This started about one month ago. Pt states it was worse before and then got better. Pt was given lasix in the past  by pcp. She was told verbally per pt take 1 tab a day for 3 days then as needed. Pt only took first 3 days of tabs and then stopped. States no lasix for about 3 wks.   Just last 2 days swelling left calf again.  Pt had a normal echo. No cxr done.  Pt reports no sob or wheezing. No chest pain.  Pt has some fatigue. Also recent menses 3 times in past month.  Pt states that usually her cycle are regular. Last menses past month came when she expected it to. Most recently restarted yesterday again  Pt not sexually acitive.    Review of Systems  Constitutional: Negative for chills, fatigue and fever.  Respiratory: Negative for cough, chest tightness, shortness of breath and wheezing.   Cardiovascular: Negative for chest pain and palpitations.  Genitourinary: Positive for menstrual problem and vaginal bleeding. Negative for difficulty urinating, flank pain, frequency, pelvic pain, urgency, vaginal discharge and vaginal pain.       See hpi.  Musculoskeletal:       Pedal edema. Faint popliteal pain(on exam0  Neurological: Negative for dizziness and light-headedness.  Hematological: Does not bruise/bleed easily.  Psychiatric/Behavioral: Negative for behavioral problems and confusion.   Past Medical History:  Diagnosis Date  . Depression   . History of chicken pox   . History of kidney stones   . Thyroid disease    hypothyroid     Social History   Social History  . Marital status: Single    Spouse name: N/A  . Number of children: N/A  . Years of education: N/A   Occupational History  . Not on file.   Social History Main Topics  . Smoking status: Never Smoker  . Smokeless tobacco: Never Used  . Alcohol use No  . Drug use: No   . Sexual activity: Not on file   Other Topics Concern  . Not on file   Social History Narrative   Unemployed   Lives with Dad   Seeking work   Only child   Dog     Past Surgical History:  Procedure Laterality Date  . KNEE ARTHROSCOPY Right 2007  . TONSILLECTOMY  2010    Family History  Problem Relation Age of Onset  . Cancer Paternal Aunt     colon  . Cancer Paternal Uncle     "small cell cancer"  . Arthritis Maternal Grandmother   . Diabetes Maternal Grandfather   . Arthritis Maternal Grandfather   . Arthritis Paternal Grandmother   . Arthritis Paternal Grandfather     Allergies  Allergen Reactions  . Augmentin [Amoxicillin-Pot Clavulanate]     hives    Current Outpatient Prescriptions on File Prior to Visit  Medication Sig Dispense Refill  . Ferrous Sulfate 28 MG TABS Take 2 tablets by mouth daily.    . furosemide (LASIX) 20 MG tablet Take 1 tablet (20 mg total) by mouth daily. 30 tablet 0  . lamoTRIgine (LAMICTAL) 100 MG tablet Take 200 mg by mouth daily.     Marland Kitchen levothyroxine (SYNTHROID, LEVOTHROID) 125 MCG tablet Take 1 tablet (125 mcg total) by mouth daily. 30 tablet 2  .  Multiple Vitamins-Minerals (MULTIVITAMIN WITH MINERALS) tablet Take 1 tablet by mouth daily.    Marland Kitchen. omeprazole (PRILOSEC) 20 MG capsule Take 2 capsules (40 mg total) by mouth daily. 60 capsule 2  . propranolol (INDERAL) 60 MG tablet Take 60 mg by mouth daily.     No current facility-administered medications on file prior to visit.     BP 125/80 Comment: was unable to get bp  Pulse 95   Temp 97.7 F (36.5 C) (Oral)   Wt (!) 356 lb 3.2 oz (161.6 kg)   LMP 11/24/2015   SpO2 97%   BMI 55.79 kg/m       Objective:   Physical Exam  General- No acute distress. Pleasant patient. Neck- Full range of motion, no jvd Lungs- Clear, even and unlabored. Heart- regular rate and rhythm. Neurologic- CNII- XII grossly intact.  Abd- soft, nt, nd, +bs. Back- no cva tenderness.  Lower ext- 1+  pretibial edema. Lt calf may be more swollen than rt. Lt side +homan sign. Faint bulge on palpation Rt side- faint +homan sign. No bulge.       Assessment & Plan:  For your pedal edema will get cxr and cmp.(check potassium level). Restart lasix as your pcp advised.  For your pain behind knees will get lower ext US tests.  For heavy irregular cycles and fatigue will get cbc and rx provera.  If by end of 7 days bleeding not resolving then notify us and will refer to gyn.  Follow up 7-10 days or as needed  Jearlean Demauro, Ramon DredgeEdward, VF CorporationPA-C

## 2015-11-30 NOTE — Progress Notes (Signed)
Pre visit review using our clinic review tool, if applicable. No additional management support is needed unless otherwise documented below in the visit note. 

## 2015-11-30 NOTE — Patient Instructions (Signed)
For your pedal edema will get cxr and cmp.(check potassium level). Restart lasix as your pcp advised.  For your pain behind knees will get lower ext US tests.  For heavy irregular cycles and fatigue will get cbc and rx provera.  If by end of 7 days bleeding not resolving then notify us and will refer to gyn.  Follow up 7-10 days or as needed

## 2015-12-01 LAB — CBC WITH DIFFERENTIAL/PLATELET
BASOS ABS: 0 10*3/uL (ref 0.0–0.1)
BASOS PCT: 0.3 % (ref 0.0–3.0)
EOS ABS: 0.2 10*3/uL (ref 0.0–0.7)
Eosinophils Relative: 2.5 % (ref 0.0–5.0)
HEMATOCRIT: 42.3 % (ref 36.0–46.0)
HEMOGLOBIN: 14.2 g/dL (ref 12.0–15.0)
LYMPHS PCT: 27.7 % (ref 12.0–46.0)
Lymphs Abs: 1.9 10*3/uL (ref 0.7–4.0)
MCHC: 33.6 g/dL (ref 30.0–36.0)
MCV: 84 fl (ref 78.0–100.0)
MONO ABS: 0.3 10*3/uL (ref 0.1–1.0)
Monocytes Relative: 4.8 % (ref 3.0–12.0)
Neutro Abs: 4.5 10*3/uL (ref 1.4–7.7)
Neutrophils Relative %: 64.7 % (ref 43.0–77.0)
Platelets: 202 10*3/uL (ref 150.0–400.0)
RBC: 5.04 Mil/uL (ref 3.87–5.11)
RDW: 14.9 % (ref 11.5–15.5)
WBC: 6.9 10*3/uL (ref 4.0–10.5)

## 2015-12-01 LAB — COMPREHENSIVE METABOLIC PANEL
ALBUMIN: 4 g/dL (ref 3.5–5.2)
ALT: 33 U/L (ref 0–35)
AST: 49 U/L — AB (ref 0–37)
Alkaline Phosphatase: 70 U/L (ref 39–117)
BILIRUBIN TOTAL: 0.5 mg/dL (ref 0.2–1.2)
BUN: 9 mg/dL (ref 6–23)
CALCIUM: 8.6 mg/dL (ref 8.4–10.5)
CHLORIDE: 107 meq/L (ref 96–112)
CO2: 26 mEq/L (ref 19–32)
CREATININE: 0.77 mg/dL (ref 0.40–1.20)
GFR: 96.52 mL/min (ref >60.00)
Glucose, Bld: 100 mg/dL — ABNORMAL HIGH (ref 70–99)
Potassium: 3.9 mEq/L (ref 3.5–5.1)
SODIUM: 139 meq/L (ref 135–145)
TOTAL PROTEIN: 7.1 g/dL (ref 6.0–8.3)

## 2015-12-19 ENCOUNTER — Encounter: Payer: Self-pay | Admitting: Dietician

## 2015-12-19 ENCOUNTER — Encounter: Payer: No Typology Code available for payment source | Attending: Family | Admitting: Dietician

## 2015-12-19 DIAGNOSIS — Z713 Dietary counseling and surveillance: Secondary | ICD-10-CM | POA: Insufficient documentation

## 2015-12-19 NOTE — Progress Notes (Signed)
  Medical Nutrition Therapy:  Appt start time: 205 end time:  255   Assessment:  Primary concerns today: Margaret Golden states that she is here today because she wants to get healthier. She lives with her dad and states her mother is "out of the picture." Margaret Golden has trouble sleeping. Has put her job hunt on hold due to health issues. Considers herself a night owl and wakes up in the afternoons. She moved to [place] 2 years ago due to her dad's job. Dad cannot eat green vegetables due to a "chlorophyll allergy." She does the grocery shopping for herself about once every 1-2 weeks, sometimes with a list. She notes that she eats for emotional reasons and sometimes does not realize that she has eaten until later. She reports that she binge-eats during times of severe depression.    Preferred Learning Style:   No preference indicated   Learning Readiness:   Contemplating  Ready  MEDICATIONS: see list   DIETARY INTAKE: Avoided foods include bananas, yogurt, most seafood.   She states that some days she does not eat and some days she "cannot stop."  24-hr recall:  Water with medication. B (1 PM): waffles or toast, water  Snk ( AM):   L (4-5 PM): pretzels or cheese or peanut butter sandwich, soda Snk ( PM):  D (9-10 PM): steak with frozen corn or pasta Snk ( PM): crackers  Beverages: three 12-oz cans of soda per day, water, occasionally juice or milk  Usual physical activity: none, tries to walk sometimes  Estimated energy needs: 2000-2200 calories   Progress Towards Goal(s):  In progress.   Nutritional Diagnosis:  [name]-3.3 Overweight/obesity As related to physical inactivity, erratic meal and sleep schedule, and inappropriate food and beverage choices.  As evidenced by BMI 56.2.    Intervention:  Nutrition counseling provided. Goals: To be healthier.  -Balanced diet  -Not get sick as much  -More active and more energy  * Work on developing a routine: -Continue to have your Saturday game  date -Therapist every Monday -Live streaming at 7:30 on weeknights and Sundays   -Aim to go to the grocery store 1x a week  -Aim for 600 steps while you're there -Make a plan to go to the grocery store and set an alarm the night before   -Always take a list  Work on finding a balance with meals and snacks: -Refer to Plate Method -Fill up on non starchy veggies (any veggie that is not corn, peas, or potatoes)  -Raw or cooked, fresh or frozen! -Include a protein food with each meal and snack: cheese, eggs, meat, peanut butter  -Keep working on reducing sodas to 1 per day  -Sparkling water!  Teaching Method Utilized:  Visual Auditory Hands on  Handouts given during visit include:  My Plate  Barriers to learning/adherence to lifestyle change: depression, erratic sleeping pattern  Demonstrated degree of understanding via:  Teach Back   Monitoring/Evaluation:  Dietary intake, exercise, and body weight in 2 month(s).

## 2015-12-19 NOTE — Patient Instructions (Addendum)
Goals: To be healthier.  -Balanced diet  -Not get sick as much  -More active and more energy  * Work on developing a routine: -Continue to have your Saturday game date -Therapist every Monday -Live streaming at 7:30 on weeknights and Sundays   -Aim to go to the grocery store 1x a week  -Aim for 600 steps while you're there -Make a plan to go to the grocery store and set an alarm the night before   -Always take a list  Work on finding a balance with meals and snacks: -Refer to Plate Method -Fill up on non starchy veggies (any veggie that is not corn, peas, or potatoes)  -Raw or cooked, fresh or frozen! -Include a protein food with each meal and snack: cheese, eggs, meat, peanut butter  -Keep working on reducing sodas to 1 per day  -Sparkling water!

## 2015-12-23 ENCOUNTER — Other Ambulatory Visit: Payer: PRIVATE HEALTH INSURANCE

## 2015-12-27 ENCOUNTER — Ambulatory Visit (HOSPITAL_BASED_OUTPATIENT_CLINIC_OR_DEPARTMENT_OTHER): Payer: PRIVATE HEALTH INSURANCE

## 2016-01-02 ENCOUNTER — Ambulatory Visit (HOSPITAL_BASED_OUTPATIENT_CLINIC_OR_DEPARTMENT_OTHER): Payer: No Typology Code available for payment source | Attending: Family | Admitting: Pulmonary Disease

## 2016-01-02 DIAGNOSIS — G4710 Hypersomnia, unspecified: Secondary | ICD-10-CM | POA: Insufficient documentation

## 2016-01-02 DIAGNOSIS — E669 Obesity, unspecified: Secondary | ICD-10-CM | POA: Insufficient documentation

## 2016-01-02 DIAGNOSIS — R5383 Other fatigue: Secondary | ICD-10-CM | POA: Insufficient documentation

## 2016-01-02 DIAGNOSIS — G4733 Obstructive sleep apnea (adult) (pediatric): Secondary | ICD-10-CM

## 2016-01-04 DIAGNOSIS — G4730 Sleep apnea, unspecified: Secondary | ICD-10-CM

## 2016-01-04 NOTE — Procedures (Signed)
Patient Name: Margaret Golden, [name] Date: 01/02/2016 Gender: Female D.O.B: 1990-08-20 Age (years): 24 Referring Provider: Macario CarlsMelissa OSullivan Height (inches): 66 Interpreting Physician: Cyril Mourningakesh Kennede Lusk MD, ABSM Weight (lbs): 340 RPSGT: Shelah LewandowskyGregory, Kenyon BMI: 55 MRN: [id number] Neck Size: 18.00   CLINICAL INFORMATION Sleep Study Type: NPSG Indication for sleep study: Fatigue, Obesity, OSA, Re-Evaluation Epworth Sleepiness Score: 6   SLEEP STUDY TECHNIQUE As per the AASM Manual for the Scoring of Sleep and Associated Events v2.3 (April 2016) with a hypopnea requiring 4% desaturations. The channels recorded and monitored were frontal, central and occipital EEG, electrooculogram (EOG), submentalis EMG (chin), nasal and oral airflow, thoracic and abdominal wall motion, anterior tibialis EMG, snore microphone, electrocardiogram, and pulse oximetry.   SLEEP ARCHITECTURE The study was initiated at 10:43:04 PM and ended at 4:55:54 AM. Sleep onset time was 5.5 minutes and the sleep efficiency was 55.1%. The total sleep time was 205.5 minutes. Stage REM latency was N/A minutes. The patient spent 54.50% of the night in stage N1 sleep, 45.50% in stage N2 sleep, 0.00% in stage N3 and 0.00% in REM. Alpha intrusion was absent. Supine sleep was 51.09%.   RESPIRATORY PARAMETERS The overall apnea/hypopnea index (AHI) was 3.5 per hour. There were 0 total apneas, including 0 obstructive, 0 central and 0 mixed apneas. There were 12 hypopneas and 5 RERAs. The AHI during Stage REM sleep was N/A per hour. AHI while supine was 6.9 per hour. The mean oxygen saturation was 95.53%. The minimum SpO2 during sleep was 90.00%. Soft snoring was noted during this study.   CARDIAC DATA The 2 lead EKG demonstrated sinus rhythm. The mean heart rate was 84.40 beats per minute. Other EKG findings include: None.   LEG MOVEMENT DATA The total PLMS were 0 with a resulting PLMS index of 0.00. Associated arousal with leg  movement index was 0.0 .   IMPRESSIONS - No significant obstructive sleep apnea occurred during this study (AHI = 3.5/h). - No significant central sleep apnea occurred during this study (CAI = 0.0/h). - The patient had minimal or no oxygen desaturation during the study (Min O2 = 90.00%) - The patient snored with Soft snoring volume. - No cardiac abnormalities were noted during this study. - Clinically significant periodic limb movements did not occur during sleep. No significant associated arousals.   DIAGNOSIS - hypersomnolence   RECOMMENDATIONS - Avoid alcohol, sedatives and other CNS depressants that may worsen sleep apnea and disrupt normal sleep architecture. - Sleep hygiene should be reviewed to assess factors that may improve sleep quality. - Weight management and regular exercise should be initiated or continued if appropriate. - Consider MSLT if she remains sleepy or if there is clinical concern for narcolepsy   Cyril Mourningakesh Yazmen Briones MD Board Certified in Sleep medicine

## 2016-01-06 ENCOUNTER — Telehealth: Payer: Self-pay | Admitting: Family

## 2016-01-06 NOTE — Telephone Encounter (Signed)
Please let pt know that sleep study was negative for sleep apnea.  I think her sleepiness may be more related to her depression. Please follow up as scheduled on 02/03/16.

## 2016-01-06 NOTE — Telephone Encounter (Signed)
Left message in pt voicemail to call the office back.  PC

## 2016-01-09 NOTE — Telephone Encounter (Signed)
Called the patient left message to call back 

## 2016-01-11 ENCOUNTER — Other Ambulatory Visit: Payer: Self-pay | Admitting: Internal Medicine

## 2016-01-20 NOTE — Telephone Encounter (Signed)
Will discuss at 11/3 visit.

## 2016-02-03 ENCOUNTER — Ambulatory Visit (INDEPENDENT_AMBULATORY_CARE_PROVIDER_SITE_OTHER): Payer: PRIVATE HEALTH INSURANCE | Admitting: Family

## 2016-02-03 ENCOUNTER — Encounter: Payer: Self-pay | Admitting: Family

## 2016-02-03 VITALS — BP 134/91 | HR 79 | Temp 98.5°F | Resp 20 | Ht 67.0 in | Wt 362.2 lb

## 2016-02-03 DIAGNOSIS — M79672 Pain in left foot: Secondary | ICD-10-CM | POA: Diagnosis not present

## 2016-02-03 MED ORDER — MELOXICAM 7.5 MG PO TABS: 7.5000 mg | ORAL_TABLET | Freq: Every day | ORAL | 0 refills | Status: DC

## 2016-02-03 NOTE — Patient Instructions (Signed)
Begin meloxicam for left foot pain. Please call if your symptoms worsen or if not improved in 1-2 weeks.

## 2016-02-03 NOTE — Progress Notes (Signed)
Pre visit review using our clinic review tool, if applicable. No additional management support is needed unless otherwise documented below in the visit note. 

## 2016-02-03 NOTE — Progress Notes (Signed)
Subjective:    Patient ID: Margaret Golden, female    DOB: 03/18/1991, 25 y.o.   MRN: [id number]  HPI  [name] is a 25 yr old female who presents today with c/o left foot pain. Reports remote injury to the left ankle back in middle school.  Reports that she kept "re-injuring" and was in a medical boot "at least every 6 months" through high school.    Review of Systems See HPI  Past Medical History:  Diagnosis Date  . Depression   . History of chicken pox   . History of kidney stones   . Thyroid disease    hypothyroid     Social History   Social History  . Marital status: Single    Spouse name: N/A  . Number of children: N/A  . Years of education: N/A   Occupational History  . Not on file.   Social History Main Topics  . Smoking status: Never Smoker  . Smokeless tobacco: Never Used  . Alcohol use No  . Drug use: No  . Sexual activity: Not on file   Other Topics Concern  . Not on file   Social History Narrative   Unemployed   Lives with Dad   Seeking work   Only child   Dog     Past Surgical History:  Procedure Laterality Date  . KNEE ARTHROSCOPY Right 2007  . TONSILLECTOMY  2010    Family History  Problem Relation Age of Onset  . Cancer Paternal Aunt     colon  . Cancer Paternal Uncle     "small cell cancer"  . Diabetes Maternal Grandfather   . Arthritis Maternal Grandfather   . Arthritis Maternal Grandmother   . Arthritis Paternal Grandmother   . Arthritis Paternal Grandfather     Allergies  Allergen Reactions  . Augmentin [Amoxicillin-Pot Clavulanate]     hives    Current Outpatient Prescriptions on File Prior to Visit  Medication Sig Dispense Refill  . furosemide (LASIX) 20 MG tablet Take 1 tablet (20 mg total) by mouth daily. 30 tablet 0  . lamoTRIgine (LAMICTAL) 100 MG tablet Take 200 mg by mouth daily.     Marland Kitchen. levothyroxine (SYNTHROID, LEVOTHROID) 125 MCG tablet Take 1 tablet (125 mcg total) by mouth daily. 30 tablet 2  .  medroxyPROGESTERone (PROVERA) 10 MG tablet Take 1 tablet (10 mg total) by mouth daily. 7 tablet 0  . Multiple Vitamins-Minerals (MULTIVITAMIN WITH MINERALS) tablet Take 1 tablet by mouth daily.    Marland Kitchen. omeprazole (PRILOSEC) 20 MG capsule TAKE TWO CAPSULES BY MOUTH DAILY 60 capsule 1  . propranolol (INDERAL) 60 MG tablet Take 60 mg by mouth daily.     No current facility-administered medications on file prior to visit.     BP (!) 134/91 (BP Location: Left Arm, Patient Position: Sitting, Cuff Size: Large)   Pulse 79   Temp 98.5 F (36.9 C) (Oral)   Resp 20   Ht 5\' 7"  (1.702 m)   Wt (!) 362 lb 3.2 oz (164.3 kg)   LMP 01/07/2016   SpO2 100% Comment: RA  BMI 56.73 kg/m       Objective:   Physical Exam  Constitutional: She appears well-developed and well-nourished.  Cardiovascular: Normal rate, regular rhythm and normal heart sounds.   No murmur heard. Pulmonary/Chest: Effort normal and breath sounds normal. No respiratory distress. She has no wheezes.  Musculoskeletal:  1+ bilateral pedal edema.    Neurological: She is alert.  Psychiatric: She has a normal mood and affect. Her behavior is normal. Judgment and thought content normal.          Assessment & Plan:  Left foot pain- deteriorated.  Had negative bilateral LE dopplers. Has hx of recurrent left ankle injury (though no recent injury).  Could be some mild arthritis pain in left foot. Trial of meloxicam, if symptoms worsen or if symptoms do not improve, consider referral to orthopedics. We did discuss the importance of weight loss.

## 2016-02-06 ENCOUNTER — Encounter: Payer: Self-pay | Admitting: Family

## 2016-02-07 ENCOUNTER — Encounter: Payer: Self-pay | Admitting: Family

## 2016-02-20 ENCOUNTER — Encounter: Payer: PRIVATE HEALTH INSURANCE | Attending: Family | Admitting: Dietician

## 2016-02-20 DIAGNOSIS — Z713 Dietary counseling and surveillance: Secondary | ICD-10-CM | POA: Insufficient documentation

## 2016-02-20 NOTE — Progress Notes (Signed)
  Medical Nutrition Therapy:  Appt start time: 245 end time:  315  Follow up:  Primary concerns today: Margaret Golden returns having gained 2 pounds since last visit. She reports worsening depression. Has not been able to walk much due to leg/knee pain and weakness. Has vertigo. Bought individual portions of Caesar salad and liked having this at home as an option. She sees her psychiatrist every 2 months and a life coach every week. Takes Lamictal for depression and it was recently increased. States she has been having short term memory problems and difficulty focusing. Would like to be tested for ADD. She is interested in exploring other psychiatry providers.  Preferred Learning Style:   No preference indicated   Learning Readiness:   Contemplating  Ready  MEDICATIONS: see list   DIETARY INTAKE: Avoided foods include bananas, yogurt, most seafood.   She states that some days she does not eat and some days she "cannot stop."  24-hr recall:  Water with medication. B (1 PM): waffles or toast, water  Snk ( AM):   L (4-5 PM): pretzels or cheese or peanut butter sandwich, soda Snk ( PM):  D (9-10 PM): steak with frozen corn or pasta Snk ( PM): crackers  Beverages: three 12-oz cans of soda per day, water, occasionally juice or milk  Usual physical activity: none, tries to walk sometimes  Estimated energy needs: 2000-2200 calories   Progress Towards Goal(s):  In progress.   Nutritional Diagnosis:  [place]-3.3 Overweight/obesity As related to physical inactivity, erratic meal and sleep schedule, and inappropriate food and beverage choices.  As evidenced by BMI 56.2.    Intervention:  Nutrition counseling provided.  Teaching Method Utilized:  Visual Auditory Hands on  Handouts given during visit include:  none  Barriers to learning/adherence to lifestyle change: depression, erratic sleeping pattern  Demonstrated degree of understanding via:  Teach Back   Monitoring/Evaluation:   Dietary intake, exercise, and body weight in 2 month(s).

## 2016-02-20 NOTE — Patient Instructions (Addendum)
-  Ask Lemont FillersMelissa O'Sullivan's office or Tina Griffithsim White to recommend a psychiatrist covered by your insurance -Advocate for yourself and don't be afraid to ask for what you want  Goals: To be healthier.  -Balanced diet  -Not get sick as much  -More active and more energy  *Work on developing a routine: -Continue to have your Saturday game date -Therapist every Monday -Live streaming at 8pm  -Keep working on reducing sodas to 1 per day  -Wean off of caffeine SLOWLY  -Try decaf, diet soda as a "stepping stone"  Grocery list: sparkling water, salads

## 2016-02-21 ENCOUNTER — Encounter: Payer: Self-pay | Admitting: Dietician

## 2016-03-29 ENCOUNTER — Other Ambulatory Visit: Payer: Self-pay | Admitting: Internal Medicine

## 2016-04-10 ENCOUNTER — Ambulatory Visit: Payer: Self-pay | Admitting: Dietician

## 2016-05-08 DIAGNOSIS — F4322 Adjustment disorder with anxiety: Secondary | ICD-10-CM | POA: Diagnosis not present

## 2016-05-11 ENCOUNTER — Encounter: Payer: Self-pay | Admitting: Family

## 2016-05-11 ENCOUNTER — Ambulatory Visit (INDEPENDENT_AMBULATORY_CARE_PROVIDER_SITE_OTHER): Payer: BLUE CROSS/BLUE SHIELD | Admitting: Family

## 2016-05-11 VITALS — BP 119/83 | HR 93 | Temp 98.3°F | Resp 16 | Ht 67.0 in | Wt 354.8 lb

## 2016-05-11 DIAGNOSIS — E039 Hypothyroidism, unspecified: Secondary | ICD-10-CM

## 2016-05-11 DIAGNOSIS — J329 Chronic sinusitis, unspecified: Secondary | ICD-10-CM | POA: Diagnosis not present

## 2016-05-11 LAB — TSH: TSH: 3.79 mIU/L

## 2016-05-11 MED ORDER — DOXYCYCLINE HYCLATE 100 MG PO TABS: 100.0000 mg | ORAL_TABLET | Freq: Two times a day (BID) | ORAL | 0 refills | Status: DC

## 2016-05-11 MED ORDER — LEVOTHYROXINE SODIUM 125 MCG PO TABS: 125.0000 ug | ORAL_TABLET | Freq: Every day | ORAL | 5 refills | Status: DC

## 2016-05-11 MED ORDER — OMEPRAZOLE 20 MG PO CPDR: 40.0000 mg | DELAYED_RELEASE_CAPSULE | Freq: Every day | ORAL | 5 refills | Status: DC

## 2016-05-11 NOTE — Patient Instructions (Signed)
Please complete lab work prior to leaving. Begin doxycycline for sinusitis.  Call if new/worsening symptoms or if symptoms are not improved in 3 days.

## 2016-05-11 NOTE — Progress Notes (Addendum)
Subjective:    Patient ID: Margaret Golden, female    DOB: 28-Jul-1990, 26 y.o.   MRN: [id number]  HPI  [name] is a 26 yr old female who presents today with c/o head congestion/sore throat, sinus drainage and cough. Symptoms began 3 weeks ago.  Reports that she has felt run down on some days.  Denies fever.   Hypothyroid- reports good compliance with synthroid.  (except she ran out 2 days ago).  Lab Results  Component Value Date   TSH 0.34 (L) 11/08/2015   Morbid obesity- she has lost 6 pounds since her last visit.  Wt Readings from Last 3 Encounters:  05/11/16 (!) 354 lb 12.8 oz (160.9 kg)  02/21/16 (!) 360 lb 8 oz (163.5 kg)  02/03/16 (!) 362 lb 3.2 oz (164.3 kg)   She is seeing psych for depression. Review of Systems See HPI  Past Medical History:  Diagnosis Date  . Depression   . History of chicken pox   . History of kidney stones   . Thyroid disease    hypothyroid     Social History   Social History  . Marital status: Single    Spouse name: N/A  . Number of children: N/A  . Years of education: N/A   Occupational History  . Not on file.   Social History Main Topics  . Smoking status: Never Smoker  . Smokeless tobacco: Never Used  . Alcohol use No  . Drug use: No  . Sexual activity: Not on file   Other Topics Concern  . Not on file   Social History Narrative   Unemployed   Lives with Dad   Seeking work   Only child   Dog     Past Surgical History:  Procedure Laterality Date  . KNEE ARTHROSCOPY Right 2007  . TONSILLECTOMY  2010    Family History  Problem Relation Age of Onset  . Cancer Paternal Aunt     colon  . Cancer Paternal Uncle     "small cell cancer"  . Diabetes Maternal Grandfather   . Arthritis Maternal Grandfather   . Arthritis Maternal Grandmother   . Arthritis Paternal Grandmother   . Arthritis Paternal Grandfather     Allergies  Allergen Reactions  . Augmentin [Amoxicillin-Pot Clavulanate]     hives     Current Outpatient Prescriptions on File Prior to Visit  Medication Sig Dispense Refill  . furosemide (LASIX) 20 MG tablet Take 1 tablet (20 mg total) by mouth daily. (Patient taking differently: Take 20 mg by mouth daily as needed. ) 30 tablet 0  . lamoTRIgine (LAMICTAL) 100 MG tablet Take 150 mg by mouth daily.     Marland Kitchen levothyroxine (SYNTHROID, LEVOTHROID) 125 MCG tablet Take 1 tablet (125 mcg total) by mouth daily. 30 tablet 2  . Multiple Vitamins-Minerals (MULTIVITAMIN WITH MINERALS) tablet Take 1 tablet by mouth daily.    Marland Kitchen omeprazole (PRILOSEC) 20 MG capsule Take 2 capsules (40 mg total) by mouth daily. 60 capsule 0  . propranolol (INDERAL) 60 MG tablet Take 60 mg by mouth daily.     No current facility-administered medications on file prior to visit.     BP 119/83 (BP Location: Right Arm, Cuff Size: Large)   Pulse 93   Temp 98.3 F (36.8 C) (Oral)   Resp 16   Ht 5\' 7"  (1.702 m)   Wt (!) 354 lb 12.8 oz (160.9 kg)   LMP 05/03/2016   SpO2 100% Comment: room  air  BMI 55.57 kg/m       Objective:   Physical Exam  Constitutional: She is oriented to person, place, and time. She appears well-developed and well-nourished.  Morbidly obese female  HENT:  Head: Normocephalic and atraumatic.  Right Ear: Tympanic membrane and ear canal normal.  Left Ear: Tympanic membrane and ear canal normal.  Nose: Right sinus exhibits no maxillary sinus tenderness and no frontal sinus tenderness. Left sinus exhibits no maxillary sinus tenderness and no frontal sinus tenderness.  Mouth/Throat: No oropharyngeal exudate, posterior oropharyngeal edema or posterior oropharyngeal erythema.  Eyes: No scleral icterus.  Cardiovascular: Normal rate, regular rhythm and normal heart sounds.   No murmur heard. Pulmonary/Chest: Effort normal and breath sounds normal. No respiratory distress. She has no wheezes.  Neurological: She is alert and oriented to person, place, and time.  Skin: Skin is warm and dry.   Psychiatric: She has a normal mood and affect. Her behavior is normal. Judgment and thought content normal.          Assessment & Plan:  Sinusitis- rx with doxy due to duration of symptoms.

## 2016-05-11 NOTE — Assessment & Plan Note (Signed)
Clinically stable on synthroid, obtain tsh, continue synthroid.  

## 2016-05-11 NOTE — Assessment & Plan Note (Signed)
We discussed referral to medical weight loss clinic.  She will discuss cost with her dad and let me know if she decides to proceed.

## 2016-05-11 NOTE — Progress Notes (Signed)
Pre visit review using our clinic review tool, if applicable. No additional management support is needed unless otherwise documented below in the visit note. 

## 2016-05-14 DIAGNOSIS — F4322 Adjustment disorder with anxiety: Secondary | ICD-10-CM | POA: Diagnosis not present

## 2016-05-21 DIAGNOSIS — F4322 Adjustment disorder with anxiety: Secondary | ICD-10-CM | POA: Diagnosis not present

## 2016-05-28 DIAGNOSIS — F4322 Adjustment disorder with anxiety: Secondary | ICD-10-CM | POA: Diagnosis not present

## 2016-06-06 DIAGNOSIS — F4322 Adjustment disorder with anxiety: Secondary | ICD-10-CM | POA: Diagnosis not present

## 2016-06-12 DIAGNOSIS — F4322 Adjustment disorder with anxiety: Secondary | ICD-10-CM | POA: Diagnosis not present

## 2016-06-21 DIAGNOSIS — F4322 Adjustment disorder with anxiety: Secondary | ICD-10-CM | POA: Diagnosis not present

## 2016-06-25 DIAGNOSIS — F4322 Adjustment disorder with anxiety: Secondary | ICD-10-CM | POA: Diagnosis not present

## 2016-06-27 DIAGNOSIS — F331 Major depressive disorder, recurrent, moderate: Secondary | ICD-10-CM | POA: Diagnosis not present

## 2016-06-27 DIAGNOSIS — F4010 Social phobia, unspecified: Secondary | ICD-10-CM | POA: Diagnosis not present

## 2016-06-27 DIAGNOSIS — F900 Attention-deficit hyperactivity disorder, predominantly inattentive type: Secondary | ICD-10-CM | POA: Diagnosis not present

## 2016-07-02 DIAGNOSIS — F4322 Adjustment disorder with anxiety: Secondary | ICD-10-CM | POA: Diagnosis not present

## 2016-07-06 ENCOUNTER — Ambulatory Visit (INDEPENDENT_AMBULATORY_CARE_PROVIDER_SITE_OTHER): Payer: BLUE CROSS/BLUE SHIELD | Admitting: Medical

## 2016-07-06 VITALS — BP 138/72 | HR 108 | Temp 98.6°F | Resp 16 | Ht 67.0 in | Wt 356.0 lb

## 2016-07-06 DIAGNOSIS — E669 Obesity, unspecified: Secondary | ICD-10-CM | POA: Diagnosis not present

## 2016-07-06 DIAGNOSIS — R739 Hyperglycemia, unspecified: Secondary | ICD-10-CM

## 2016-07-06 DIAGNOSIS — F329 Major depressive disorder, single episode, unspecified: Secondary | ICD-10-CM

## 2016-07-06 DIAGNOSIS — R682 Dry mouth, unspecified: Secondary | ICD-10-CM | POA: Diagnosis not present

## 2016-07-06 DIAGNOSIS — J301 Allergic rhinitis due to pollen: Secondary | ICD-10-CM

## 2016-07-06 DIAGNOSIS — R252 Cramp and spasm: Secondary | ICD-10-CM

## 2016-07-06 DIAGNOSIS — F32A Depression, unspecified: Secondary | ICD-10-CM

## 2016-07-06 LAB — COMPREHENSIVE METABOLIC PANEL
ALBUMIN: 4.1 g/dL (ref 3.6–5.1)
ALK PHOS: 75 U/L (ref 33–115)
ALT: 58 U/L — AB (ref 6–29)
AST: 94 U/L — ABNORMAL HIGH (ref 10–30)
BUN: 12 mg/dL (ref 7–25)
CHLORIDE: 104 mmol/L (ref 98–110)
CO2: 28 mmol/L (ref 20–31)
Calcium: 9 mg/dL (ref 8.6–10.2)
Creat: 0.75 mg/dL (ref 0.50–1.10)
Glucose, Bld: 94 mg/dL (ref 65–99)
POTASSIUM: 4.3 mmol/L (ref 3.5–5.3)
Sodium: 141 mmol/L (ref 135–146)
TOTAL PROTEIN: 7.2 g/dL (ref 6.1–8.1)
Total Bilirubin: 0.6 mg/dL (ref 0.2–1.2)

## 2016-07-06 MED ORDER — PROPRANOLOL HCL 60 MG PO TABS: 60.0000 mg | ORAL_TABLET | Freq: Every day | ORAL | 2 refills | Status: DC

## 2016-07-06 MED ORDER — LAMOTRIGINE 100 MG PO TABS: 150.0000 mg | ORAL_TABLET | Freq: Every day | ORAL | 0 refills | Status: DC

## 2016-07-06 MED ORDER — LEVOCETIRIZINE DIHYDROCHLORIDE 5 MG PO TABS: 5.0000 mg | ORAL_TABLET | Freq: Every evening | ORAL | 3 refills | Status: DC

## 2016-07-06 MED ORDER — AZELASTINE HCL 0.1 % NA SOLN: 2.0000 | Freq: Two times a day (BID) | NASAL | 3 refills | Status: DC

## 2016-07-06 NOTE — Patient Instructions (Signed)
For your depression I refilled your lamictal. Get level today. Referred you to psychiatrist. Call one on the list I gave you and notify Victorino Dike which one you called.  Refilled your b-blocker than psychiatrist wrote you.  For dry mouth will get a1-c. For muscle cramp recently get cmp.  For pedal edema can continue sparing use of lasix but when you do use also eat banana.  Refer to bariatric/weight loss.  For nasal congestion, pnd and frontal sinus pressure rx astelin and xyzal.  Follow up in 3-4 wks pcp or as needed

## 2016-07-06 NOTE — Progress Notes (Signed)
Subjective:    Patient ID: [name], female    DOB: May 19, 1990, 26 y.o.   MRN: [id number]  HPI  Pt in with ankle pain and swelling top of feet. This has been for months. Pt states extensive work up in past have ruled out diabetes, blood clot in legs, and heart issues. Pt thinks maybe thyroid medication. Pt in past might have been on diuretic. Pt has used it 2-3 times in past weeks. The other morning she had muscle cramps. Pt never has used compression stocking.  Pt last thyroid med level was normal.  Pt was considering weight loss in past. She will go through with that now.  Pt is waking up with dry throat. Pt thinks very thirst in morning. Then reports recent mild runny nose. Some pnd with nasal congestion. Pt declines steroid nasal spray but willing to use astelin.  Pt needs refill of lamictal and propanolol. (pt [facility]). Depression since very young. Prior psychiatrist differed in opinion if she has bipolar. Pt states misses last psychiatrist visit and therefore they would not refill. Pt states needs new psychiatrist.     Review of Systems  Constitutional: Negative for chills, fatigue and fever.  HENT: Positive for congestion, postnasal drip, rhinorrhea and sinus pressure. Negative for drooling, ear discharge, facial swelling and mouth sores.   Respiratory: Negative for cough, chest tightness, shortness of breath and wheezing.   Cardiovascular: Negative for chest pain and palpitations.  Gastrointestinal: Negative for abdominal distention, abdominal pain, anal bleeding and constipation.  Endocrine: Negative for polydipsia and polyphagia.       Dry mouth at times.  Genitourinary: Negative for dysuria.  Musculoskeletal: Negative for back pain, joint swelling, myalgias and neck pain.  Skin: Negative for rash.  Neurological: Positive for headaches. Negative for dizziness, seizures, speech difficulty, weakness and numbness.       More frontal sinus  area. (discussed with pt maybe allergy related)  Hematological: Negative for adenopathy. Does not bruise/bleed easily.  Psychiatric/Behavioral: Positive for dysphoric mood. Negative for agitation, behavioral problems, confusion, sleep disturbance and suicidal ideas. The patient is not nervous/anxious.     Past Medical History:  Diagnosis Date  . Depression   . History of chicken pox   . History of kidney stones   . Thyroid disease    hypothyroid     Social History   Social History  . Marital status: Single    Spouse name: N/A  . Number of children: N/A  . Years of education: N/A   Occupational History  . Not on file.   Social History Main Topics  . Smoking status: Never Smoker  . Smokeless tobacco: Never Used  . Alcohol use No  . Drug use: No  . Sexual activity: Not on file   Other Topics Concern  . Not on file   Social History Narrative   Unemployed   Lives with Dad   Seeking work   Only child   Dog     Past Surgical History:  Procedure Laterality Date  . KNEE ARTHROSCOPY Right 2007  . TONSILLECTOMY  2010    Family History  Problem Relation Age of Onset  . Cancer Paternal Aunt     colon  . Cancer Paternal Uncle     "small cell cancer"  . Diabetes Maternal Grandfather   . Arthritis Maternal Grandfather   . Arthritis Maternal Grandmother   . Arthritis Paternal Grandmother   . Arthritis Paternal Grandfather  Allergies  Allergen Reactions  . Augmentin [Amoxicillin-Pot Clavulanate]     hives    Current Outpatient Prescriptions on File Prior to Visit  Medication Sig Dispense Refill  . Ascorbic Acid (VITAMIN C) 1000 MG tablet Take 1,000 mg by mouth daily.    . Ferrous Sulfate (IRON) 28 MG TABS Take 2 tablets by mouth daily.    . furosemide (LASIX) 20 MG tablet Take 1 tablet (20 mg total) by mouth daily. (Patient taking differently: Take 20 mg by mouth daily as needed. ) 30 tablet 0  . levothyroxine (SYNTHROID, LEVOTHROID) 125 MCG tablet Take 1  tablet (125 mcg total) by mouth daily. 30 tablet 5  . Multiple Vitamins-Minerals (MULTIVITAMIN WITH MINERALS) tablet Take 1 tablet by mouth daily.    Marland Kitchen omeprazole (PRILOSEC) 20 MG capsule Take 2 capsules (40 mg total) by mouth daily. 60 capsule 5   No current facility-administered medications on file prior to visit.     BP 138/72 (BP Location: Right Arm, Patient Position: Sitting, Cuff Size: Large)   Pulse (!) 108   Temp 98.6 F (37 C) (Oral)   Resp 16   Ht  (1.702 m)   Wt (!) 356 lb (161.5 kg)   SpO2 98%   BMI 55.76 kg/m       Objective:   Physical Exam  General  Mental Status - Alert. General Appearance - Well groomed. Not in acute distress.  Skin Rashes- No Rashes.  HEENT Head- Normal. Ear Auditory Canal - Left- Normal. Right - Normal.Tympanic Membrane- Left- Normal. Right- Normal. Eye Sclera/Conjunctiva- Left- Normal. Right- Normal. Nose & Sinuses Nasal Mucosa- Left-  Boggy and Congested. Right-  Boggy and  Congested.Bilateral no maxillary and  Faint frontal sinus pressure. Mouth & Throat Lips: Upper Lip- Normal: no dryness, cracking, pallor, cyanosis, or vesicular eruption. Lower Lip-Normal: no dryness, cracking, pallor, cyanosis or vesicular eruption. Buccal Mucosa- Bilateral- No Aphthous ulcers. Oropharynx- No Discharge or Erythema. Tonsils: Characteristics- Bilateral- No Erythema or Congestion. Size/Enlargement- Bilateral- No enlargement. Discharge- bilateral-None.  Neck Neck- Supple. No Masses.   Chest and Lung Exam Auscultation: Breath Sounds:-Clear even and unlabored.  Cardiovascular Auscultation:Rythm- Regular, rate and rhythm. Murmurs & Other Heart Sounds:Ausculatation of the heart reveal- No Murmurs.  Lymphatic Head & Neck General Head & Neck Lymphatics: Bilateral: Description- No Localized lymphadenopathy.  Neurologic Cranial Nerve exam:- CN III-XII intact(No nystagmus), symmetric smile. Strength:- 5/5 equal and symmetric strength both  upper and lower extremities.       Assessment & Plan:  For your depression I refilled your lamictal. Get level today. Referred you to psychiatrist. Call one on the list I gave you and notify Victorino Dike which one you called.  Refilled your b-blocker than psychiatrist wrote you.  For dry mouth will get a1-c. For muscle cramp recently get cmp.  For pedal edema can continue sparing use of lasix but when you do use also eat banana.  Refer to bariatric/weight loss.  For nasal congestion, pnd and frontal sinus pressure rx astelin and xyzal.  Follow up in 3-4 wks pcp or as needed   Note refilled pt lamictal and propanol. She states stable now but concern with out med she won't do well so went ahead and filled.  Total time spent with pt 40 minutes. Educated and counseled pt on etiology of her edema and treatment approachch. Also counseled pt on weight loss approach and how we try to refer to pyshchiatrist and that process. Counseled time 50% of total.  Carlin Mamone, Ramon Dredge, PA-C

## 2016-07-06 NOTE — Progress Notes (Signed)
Pre visit review using our clinic review tool, if applicable. No additional management support is needed unless otherwise documented below in the visit note. 

## 2016-07-07 LAB — HEMOGLOBIN A1C
HEMOGLOBIN A1C: 4.6 % (ref <5.7)
MEAN PLASMA GLUCOSE: 85 mg/dL

## 2016-07-09 DIAGNOSIS — F4322 Adjustment disorder with anxiety: Secondary | ICD-10-CM | POA: Diagnosis not present

## 2016-07-09 LAB — LAMOTRIGINE LEVEL: Lamotrigine Lvl: <0.5 ug/mL — ABNORMAL LOW (ref 4.0–18.0)

## 2016-07-17 DIAGNOSIS — F4322 Adjustment disorder with anxiety: Secondary | ICD-10-CM | POA: Diagnosis not present

## 2016-07-27 DIAGNOSIS — F331 Major depressive disorder, recurrent, moderate: Secondary | ICD-10-CM | POA: Diagnosis not present

## 2016-08-02 DIAGNOSIS — F331 Major depressive disorder, recurrent, moderate: Secondary | ICD-10-CM | POA: Diagnosis not present

## 2016-08-06 DIAGNOSIS — F4322 Adjustment disorder with anxiety: Secondary | ICD-10-CM | POA: Diagnosis not present

## 2016-08-13 DIAGNOSIS — F4322 Adjustment disorder with anxiety: Secondary | ICD-10-CM | POA: Diagnosis not present

## 2016-08-15 ENCOUNTER — Ambulatory Visit (INDEPENDENT_AMBULATORY_CARE_PROVIDER_SITE_OTHER): Payer: BLUE CROSS/BLUE SHIELD | Admitting: Medical

## 2016-08-15 ENCOUNTER — Encounter: Payer: Self-pay | Admitting: Medical

## 2016-08-15 VITALS — BP 125/76 | HR 93 | Temp 98.2°F | Resp 16 | Ht 67.0 in | Wt 358.6 lb

## 2016-08-15 DIAGNOSIS — G43809 Other migraine, not intractable, without status migrainosus: Secondary | ICD-10-CM | POA: Diagnosis not present

## 2016-08-15 MED ORDER — SUMATRIPTAN SUCCINATE 50 MG PO TABS: 50.0000 mg | ORAL_TABLET | ORAL | 0 refills | Status: DC | PRN

## 2016-08-15 MED ORDER — OMEPRAZOLE 20 MG PO CPDR: 40.0000 mg | DELAYED_RELEASE_CAPSULE | Freq: Every day | ORAL | 5 refills | Status: DC

## 2016-08-15 MED ORDER — LEVOTHYROXINE SODIUM 125 MCG PO TABS: 125.0000 ug | ORAL_TABLET | Freq: Every day | ORAL | 5 refills | Status: DC

## 2016-08-15 MED ORDER — ONDANSETRON 4 MG PO TBDP: 4.0000 mg | ORAL_TABLET | Freq: Three times a day (TID) | ORAL | 0 refills | Status: DC | PRN

## 2016-08-15 MED ORDER — BUTALBITAL-APAP-CAFF-COD 50-325-40-30 MG PO CAPS: 1.0000 | ORAL_CAPSULE | Freq: Four times a day (QID) | ORAL | 0 refills | Status: DC | PRN

## 2016-08-15 NOTE — Progress Notes (Signed)
Subjective:    Patient ID: [name], female    DOB: 07-14-1990, 26 y.o.   MRN: [id number]  HPI  Pt in for recent ha on and off for past 2 weeks. Pt has has this morning. Pt states bi-temporal area pain and some light sensitivity. No sound sensitive. No vomiting. A lot of nausea. Pt states on routine basis will take excedrin when she gets ha and will stop about 50% of her ha's.   LMP- May 1-8, 2018.  Pt states she usually does not get migraine ha in past with severe frequency. Recent every night migraine. At most just twice a month in past.  Pt states never saw neurologist for ha in the past.   Pt never taken imitrex for migraine.  On review of recent ha never she never had any gross motor or sensory function deficits with ha.    Review of Systems  Constitutional: Negative for chills, fatigue and fever.  Eyes: Positive for photophobia. Negative for pain, discharge, redness and visual disturbance.  Respiratory: Negative for cough, chest tightness, shortness of breath, wheezing and stridor.   Cardiovascular: Negative for chest pain and palpitations.  Gastrointestinal: Positive for nausea.  Musculoskeletal: Negative for back pain, joint swelling and neck stiffness.  Skin: Negative for rash.  Neurological: Positive for headaches. Negative for dizziness, seizures, speech difficulty, weakness and light-headedness.  Hematological: Negative for adenopathy. Does not bruise/bleed easily.  Psychiatric/Behavioral: Negative for behavioral problems, dysphoric mood and hallucinations. The patient is not nervous/anxious.      Past Medical History:  Diagnosis Date  . Depression   . History of chicken pox   . History of kidney stones   . Thyroid disease    hypothyroid     Social History   Social History  . Marital status: Single    Spouse name: N/A  . Number of children: N/A  . Years of education: N/A   Occupational History  . Not on file.   Social History Main Topics  .  Smoking status: Never Smoker  . Smokeless tobacco: Never Used  . Alcohol use No  . Drug use: No  . Sexual activity: Not on file   Other Topics Concern  . Not on file   Social History Narrative   Unemployed   Lives with Dad   Seeking work   Only child   Dog     Past Surgical History:  Procedure Laterality Date  . KNEE ARTHROSCOPY Right 2007  . TONSILLECTOMY  2010    Family History  Problem Relation Age of Onset  . Cancer Paternal Aunt        colon  . Cancer Paternal Uncle        "small cell cancer"  . Diabetes Maternal Grandfather   . Arthritis Maternal Grandfather   . Arthritis Maternal Grandmother   . Arthritis Paternal Grandmother   . Arthritis Paternal Grandfather     Allergies  Allergen Reactions  . Augmentin [Amoxicillin-Pot Clavulanate]     hives    Current Outpatient Prescriptions on File Prior to Visit  Medication Sig Dispense Refill  . Ascorbic Acid (VITAMIN C) 1000 MG tablet Take 1,000 mg by mouth daily.    . Ferrous Sulfate (IRON) 28 MG TABS Take 2 tablets by mouth daily.    Marland Kitchen lamoTRIgine (LAMICTAL) 100 MG tablet Take 1.5 tablets (150 mg total) by mouth daily. 30 tablet 0  . levocetirizine (XYZAL) 5 MG tablet Take 1 tablet (5 mg total) by mouth every  evening. 30 tablet 3  . levothyroxine (SYNTHROID, LEVOTHROID) 125 MCG tablet Take 1 tablet (125 mcg total) by mouth daily. 30 tablet 5  . Multiple Vitamins-Minerals (MULTIVITAMIN WITH MINERALS) tablet Take 1 tablet by mouth daily.    Marland Kitchen. omeprazole (PRILOSEC) 20 MG capsule Take 2 capsules (40 mg total) by mouth daily. 60 capsule 5  . propranolol (INDERAL) 60 MG tablet Take 1 tablet (60 mg total) by mouth daily. 30 tablet 2   No current facility-administered medications on file prior to visit.     BP 125/76 (BP Location: Left Arm, Cuff Size: Large)   Pulse 93   Temp 98.2 F (36.8 C) (Oral)   Resp 16   Ht 5\' 7"  (1.702 m)   Wt (!) 358 lb 9.6 oz (162.7 kg)   LMP 07/31/2016   SpO2 98%   BMI 56.16 kg/m        Objective:   Physical Exam  General Mental Status- Alert. General Appearance- Not in acute distress.   Skin General: Color- Normal Color. Moisture- Normal Moisture.  Neck Carotid Arteries- Normal color. Moisture- Normal Moisture. No carotid bruits. No JVD.  Chest and Lung Exam Auscultation: Breath Sounds:-Normal.  Cardiovascular Auscultation:Rythm- Regular. Murmurs & Other Heart Sounds:Auscultation of the heart reveals- No Murmurs.  Abdomen Inspection:-Inspeection Normal. Palpation/Percussion:Note:No mass. Palpation and Percussion of the abdomen reveal- Non Tender, Non Distended + BS, no rebound or guarding.  Neurologic Cranial Nerve exam:- CN III-XII intact(No nystagmus), symmetric smile. Drift Test:- No drift. Strength:- 5/5 equal and symmetric strength both upper and lower extremities.      Assessment & Plan:  For ha today toradol but you declined. Rx fiorcet as alternative to stop ha today. Limited number given for future but will avoid depending on this exclusively.   Zofran for nausea.  For future ha migraine like use imitrex as instructed.   Follow up in 2 wks to assess effectiveness and to see if ha frequency reduced. If not then may get ct head or consider referral to neurologist.  If HA occurs severe with neurologic deficits then ED evaluation recommended.  See  pcp has appointment available but if pcp has nothing available then schedule with me.  Dakotah Orrego, Ramon DredgeEdward, PA-C

## 2016-08-15 NOTE — Patient Instructions (Addendum)
For ha today offered toradol but you declined. Rx fiorcet as alternative to stop ha today. Limited number given for future but will avoid depending on this exclusively.  Zofran for nausea.  For future ha migraine like use imitrex as instructed.   Follow up in 2 wks to assess effectiveness and to see if ha frequency reduced. If not then may get ct head or consider referral to neurologist.  If HA occurs severe with neurologic deficits then ED evaluation recommended.  See pcp has appointment available but if pcp has nothing available then schedule with me.

## 2016-08-16 ENCOUNTER — Encounter: Payer: Self-pay | Admitting: Medical

## 2016-08-16 DIAGNOSIS — F331 Major depressive disorder, recurrent, moderate: Secondary | ICD-10-CM | POA: Diagnosis not present

## 2016-08-20 DIAGNOSIS — F4322 Adjustment disorder with anxiety: Secondary | ICD-10-CM | POA: Diagnosis not present

## 2016-08-28 DIAGNOSIS — F4322 Adjustment disorder with anxiety: Secondary | ICD-10-CM | POA: Diagnosis not present

## 2016-08-31 ENCOUNTER — Encounter: Payer: Self-pay | Admitting: Family

## 2016-08-31 ENCOUNTER — Ambulatory Visit (INDEPENDENT_AMBULATORY_CARE_PROVIDER_SITE_OTHER): Payer: BLUE CROSS/BLUE SHIELD | Admitting: Family

## 2016-08-31 VITALS — BP 120/84 | HR 72 | Temp 98.1°F | Resp 16 | Ht 67.0 in | Wt 353.2 lb

## 2016-08-31 DIAGNOSIS — G43909 Migraine, unspecified, not intractable, without status migrainosus: Secondary | ICD-10-CM

## 2016-08-31 DIAGNOSIS — F329 Major depressive disorder, single episode, unspecified: Secondary | ICD-10-CM | POA: Diagnosis not present

## 2016-08-31 DIAGNOSIS — F32A Depression, unspecified: Secondary | ICD-10-CM

## 2016-08-31 NOTE — Patient Instructions (Signed)
You will be contacted about your referral to neurology and the weight loss clinic.

## 2016-08-31 NOTE — Progress Notes (Signed)
Subjective:    Patient ID: Margaret Golden, female    DOB: 11-02-1990, 26 y.o.   MRN: [id number]  HPI  Patient reports that for the last 3 weeks she has had constant frontal pressure. Denies drainage.  Reports current HA pain is 1-2/10.  Had a migraine of 10/10 last week which had associated photophobia/photobia.  She notes that she had some improvement with fioricet> imitrex.  Excedrin worked better than imitrex.  Morbid obesity- she has lost 5 pounds since her last visit.   Wt Readings from Last 3 Encounters:  08/31/16 (!) 353 lb 3.2 oz (160.2 kg)  08/15/16 (!) 358 lb 9.6 oz (162.7 kg)  07/06/16 (!) 356 lb (161.5 kg)   Depression- recently started seeing Dr[facility] Sh[facility]. Kept her on lamictal and added zoloft.  Patient reports improvement in her mood.   Review of Systems See HPI  Past Medical History:  Diagnosis Date  . Depression   . History of chicken pox   . History of kidney stones   . Thyroid disease    hypothyroid     Social History   Social History  . Marital status: Single    Spouse name: N/A  . Number of children: N/A  . Years of education: N/A   Occupational History  . Not on file.   Social History Main Topics  . Smoking status: Never Smoker  . Smokeless tobacco: Never Used  . Alcohol use No  . Drug use: No  . Sexual activity: Not on file   Other Topics Concern  . Not on file   Social History Narrative   Unemployed   Lives with Dad   Seeking work   Only child   Dog     Past Surgical History:  Procedure Laterality Date  . KNEE ARTHROSCOPY Right 2007  . TONSILLECTOMY  2010    Family History  Problem Relation Age of Onset  . Cancer Paternal Aunt        colon  . Cancer Paternal Uncle        "small cell cancer"  . Diabetes Maternal Grandfather   . Arthritis Maternal Grandfather   . Arthritis Maternal Grandmother   . Arthritis Paternal Grandmother   . Arthritis Paternal Grandfather     Allergies  Allergen Reactions   . Augmentin [Amoxicillin-Pot Clavulanate]     hives    Current Outpatient Prescriptions on File Prior to Visit  Medication Sig Dispense Refill  . Ascorbic Acid (VITAMIN C) 1000 MG tablet Take 1,000 mg by mouth daily.    . butalbital-acetaminophen-caffeine (FIORICET/CODEINE) 50-325-40-30 MG capsule Take 1 capsule by mouth every 6 (six) hours as needed for migraine. 3 capsule 0  . Ferrous Sulfate (IRON) 28 MG TABS Take 2 tablets by mouth daily.    Marland Kitchen levocetirizine (XYZAL) 5 MG tablet Take 1 tablet (5 mg total) by mouth every evening. 30 tablet 3  . levothyroxine (SYNTHROID, LEVOTHROID) 125 MCG tablet Take 1 tablet (125 mcg total) by mouth daily. 30 tablet 5  . Multiple Vitamins-Minerals (MULTIVITAMIN WITH MINERALS) tablet Take 1 tablet by mouth daily.    Marland Kitchen omeprazole (PRILOSEC) 20 MG capsule Take 2 capsules (40 mg total) by mouth daily. 60 capsule 5  . ondansetron (ZOFRAN ODT) 4 MG disintegrating tablet Take 1 tablet (4 mg total) by mouth every 8 (eight) hours as needed for nausea or vomiting. 20 tablet 0  . SUMAtriptan (IMITREX) 50 MG tablet Take 1 tablet (50 mg total) by mouth every  2 (two) hours as needed for migraine. May repeat in 2 hours if headache persists or recurs. 10 tablet 0   No current facility-administered medications on file prior to visit.     BP 120/84 (BP Location: Left Arm) Comment (Cuff Size): thigh  Pulse 72   Temp 98.1 F (36.7 C) (Oral)   Resp 16   Ht 5\' 7"  (1.702 m)   Wt (!) 353 lb 3.2 oz (160.2 kg)   LMP 08/29/2016   SpO2 100%   BMI 55.32 kg/m       Objective:   Physical Exam  Constitutional: She is oriented to person, place, and time. She appears well-developed and well-nourished.  Eyes: EOM are normal. Pupils are equal, round, and reactive to light.  Cardiovascular: Normal rate, regular rhythm and normal heart sounds.   No murmur heard. Pulmonary/Chest: Effort normal and breath sounds normal. No respiratory distress. She has no wheezes.    Neurological: She is alert and oriented to person, place, and time. She exhibits normal muscle tone.  Psychiatric: She has a normal mood and affect. Her behavior is normal. Judgment and thought content normal.          Assessment & Plan:  Migraine/Headaches- will refer to  neurology. Continue prn imitrex/excedrin for now. We discussed fioricet would require UDS and controlled substance contract and she wishes to hold off on refills and discuss with neurology.  Depression- improved. Now being managed by psychiatry.  Morbid obesity- she has lost 5 pounds. She is interested in referral to our medical weight loss clinic. Will arrange.

## 2016-09-03 DIAGNOSIS — F4322 Adjustment disorder with anxiety: Secondary | ICD-10-CM | POA: Diagnosis not present

## 2016-09-10 DIAGNOSIS — F4322 Adjustment disorder with anxiety: Secondary | ICD-10-CM | POA: Diagnosis not present

## 2016-09-12 ENCOUNTER — Encounter: Payer: Self-pay | Admitting: Family

## 2016-09-13 DIAGNOSIS — F331 Major depressive disorder, recurrent, moderate: Secondary | ICD-10-CM | POA: Diagnosis not present

## 2016-09-17 ENCOUNTER — Encounter: Payer: Self-pay | Admitting: Neurology

## 2016-09-17 DIAGNOSIS — F4322 Adjustment disorder with anxiety: Secondary | ICD-10-CM | POA: Diagnosis not present

## 2016-09-24 DIAGNOSIS — F4322 Adjustment disorder with anxiety: Secondary | ICD-10-CM | POA: Diagnosis not present

## 2016-10-01 DIAGNOSIS — F4322 Adjustment disorder with anxiety: Secondary | ICD-10-CM | POA: Diagnosis not present

## 2016-10-08 DIAGNOSIS — F4322 Adjustment disorder with anxiety: Secondary | ICD-10-CM | POA: Diagnosis not present

## 2016-10-11 DIAGNOSIS — F331 Major depressive disorder, recurrent, moderate: Secondary | ICD-10-CM | POA: Diagnosis not present

## 2016-10-15 DIAGNOSIS — F4322 Adjustment disorder with anxiety: Secondary | ICD-10-CM | POA: Diagnosis not present

## 2016-10-29 DIAGNOSIS — F4322 Adjustment disorder with anxiety: Secondary | ICD-10-CM | POA: Diagnosis not present

## 2016-11-05 DIAGNOSIS — F4322 Adjustment disorder with anxiety: Secondary | ICD-10-CM | POA: Diagnosis not present

## 2016-11-08 DIAGNOSIS — F4322 Adjustment disorder with anxiety: Secondary | ICD-10-CM | POA: Diagnosis not present

## 2016-11-09 DIAGNOSIS — F331 Major depressive disorder, recurrent, moderate: Secondary | ICD-10-CM | POA: Diagnosis not present

## 2016-11-15 DIAGNOSIS — F4322 Adjustment disorder with anxiety: Secondary | ICD-10-CM | POA: Diagnosis not present

## 2016-11-20 DIAGNOSIS — F4322 Adjustment disorder with anxiety: Secondary | ICD-10-CM | POA: Diagnosis not present

## 2016-11-27 DIAGNOSIS — F4322 Adjustment disorder with anxiety: Secondary | ICD-10-CM | POA: Diagnosis not present

## 2016-12-06 DIAGNOSIS — F4322 Adjustment disorder with anxiety: Secondary | ICD-10-CM | POA: Diagnosis not present

## 2016-12-07 ENCOUNTER — Encounter: Payer: BLUE CROSS/BLUE SHIELD | Admitting: Family

## 2016-12-10 DIAGNOSIS — F4322 Adjustment disorder with anxiety: Secondary | ICD-10-CM | POA: Diagnosis not present

## 2016-12-17 DIAGNOSIS — F4322 Adjustment disorder with anxiety: Secondary | ICD-10-CM | POA: Diagnosis not present

## 2016-12-24 DIAGNOSIS — F4322 Adjustment disorder with anxiety: Secondary | ICD-10-CM | POA: Diagnosis not present

## 2016-12-26 ENCOUNTER — Encounter: Payer: Self-pay | Admitting: Neurology

## 2016-12-26 ENCOUNTER — Ambulatory Visit (INDEPENDENT_AMBULATORY_CARE_PROVIDER_SITE_OTHER): Payer: BLUE CROSS/BLUE SHIELD | Admitting: Neurology

## 2016-12-26 VITALS — BP 110/88 | HR 110 | Ht 67.0 in | Wt 343.4 lb

## 2016-12-26 DIAGNOSIS — G43709 Chronic migraine without aura, not intractable, without status migrainosus: Secondary | ICD-10-CM

## 2016-12-26 MED ORDER — TOPIRAMATE 50 MG PO TABS: 50.0000 mg | ORAL_TABLET | Freq: Every day | ORAL | 2 refills | Status: DC

## 2016-12-26 NOTE — Patient Instructions (Signed)
Migraine Recommendations: 1.  Start topiramate  at bedtime.  If doing well, refill it.  Otherwise, call in 4 weeks with update and we can adjust dose if needed. 2.  Take Excedrin earliest onset of headache. 3.  Limit use of pain relievers to no more than 2 days out of the week.  These medications include acetaminophen, ibuprofen, triptans and narcotics.  This will help reduce risk of rebound headaches. 4.  Be aware of common food triggers such as processed sweets, processed foods with nitrites (such as deli meat, hot dogs, sausages), foods with MSG, alcohol (such as wine), chocolate, certain cheeses, certain fruits (dried fruits, some citrus fruit), vinegar, diet soda. 4.  Avoid caffeine 5.  Routine exercise 6.  Proper sleep hygiene 7.  Stay adequately hydrated with water 8.  Keep a headache diary. 9.  Maintain proper stress management. 10.  Do not skip meals.  Modify diet and weight loss. 11.  Consider supplements:  Magnesium citrate  to  daily, riboflavin , Coenzyme Q 10  three times daily 12.  Have annual eye exam   Migraine Headache A migraine headache is a very strong throbbing pain on one side or both sides of your head. Migraines can also cause other symptoms. Talk with your doctor about what things may bring on (trigger) your migraine headaches. Follow these instructions at home: Medicines  Take over-the-counter and prescription medicines only as told by your doctor.  Do not drive or use heavy machinery while taking prescription pain medicine.  To prevent or treat constipation while you are taking prescription pain medicine, your doctor may recommend that you: ? Drink enough fluid to keep your pee (urine) clear or pale yellow. ? Take over-the-counter or prescription medicines. ? Eat foods that are high in fiber. These include fresh fruits and vegetables, whole grains, and beans. ? Limit foods that are high in fat and processed sugars. These include fried  and sweet foods. Lifestyle  Avoid alcohol.  Do not use any products that contain nicotine or tobacco, such as cigarettes and e-cigarettes. If you need help quitting, ask your doctor.  Get at least 8 hours of sleep every night.  Limit your stress. General instructions   Keep a journal to find out what may bring on your migraines. For example, write down: ? What you eat and drink. ? How much sleep you get. ? Any change in what you eat or drink. ? Any change in your medicines.  If you have a migraine: ? Avoid things that make your symptoms worse, such as bright lights. ? It may help to lie down in a dark, quiet room. ? Do not drive or use heavy machinery. ? Ask your doctor what activities are safe for you.  Keep all follow-up visits as told by your doctor. This is important. Contact a doctor if:  You get a migraine that is different or worse than your usual migraines. Get help right away if:  Your migraine gets very bad.  You have a fever.  You have a stiff neck.  You have trouble seeing.  Your muscles feel weak or like you cannot control them.  You start to lose your balance a lot.  You start to have trouble walking.  You pass out (faint). This information is not intended to replace advice given to you by your health care provider. Make sure you discuss any questions you have with your health care provider. Document Released: 12/27/2007 Document Revised: 10/07/2015 Document Reviewed: 09/05/2015 Elsevier Interactive  Patient Education  2017 Elsevier Inc.  

## 2016-12-26 NOTE — Progress Notes (Signed)
NEUROLOGY CONSULTATION NOTE  Margaret Golden MRN: [id number] DOB: [birth year]  Referring provider: [name], NP Primary care provider: [name], NP  Reason for consult:  migraine  HISTORY OF PRESENT ILLNESS: Margaret Golden is a 26 year old female with depression, nonalcoholic fatty liver disease, hypothyroidism, GERD and morbid obesity who presents for migraine.  History supplemented by PCP note.  Onset:  Teenager.  Started to become more frequent at age 89. Location:  Band-like distribution Quality:  Pressure, stabbing Intensity:  7-8/10 Aura:  no Prodrome:  no Postdrome:  no Associated symptoms:  Photophobia, phonophobia.  Rarely nausea.  No vomiting or visual disturbance (except for brief double vision on rare occasions).  She has not had any new worse headache of her life, waking up from sleep Duration:  2 hours with Excedrin (otherwise up to 3 days) Frequency:  2 migraines a month, but total of 7 headache days a month. Frequency of abortive medication: once a week Triggers/exacerbating factors:  Caffeine withdrawal Relieving factors:  Excedrin if taken early Activity:  aggravates  Past NSAIDS:  Ibuprofen, Alevel Past analgesics:  Tylenol, Fioricet Past abortive triptans:  Sumatriptan  Past muscle relaxants:  no Past anti-emetic:  Zofran ODT  Past antihypertensive medications:  propranolol  (for hypertension) Past antidepressant medications:  bupropion Past anticonvulsant medications:  no Past vitamins/Herbal/Supplements:  no Other past therapies:  no  Current NSAIDS:  no Current analgesics:  Excedrin Migraine Current triptans:  no Current anti-emetic:  no Current muscle relaxants:  no Current anti-anxiolytic:  no Current sleep aide:  trazodone Current Antihypertensive medications:  no Current Antidepressant medications:  sertraline  Current Anticonvulsant medications:  lamotrigine  Current  Vitamins/Herbal/Supplements:  Vitamin D, vitamin C, MVI Current Antihistamines/Decongestants:  no Other therapy:  no  Caffeine:  Yes.  Trying to wean self off Alcohol:  rarely Smoker:  no Diet:  Does not hydrate.  Drinks soda.  Eats processed/fast food Exercise:  Recently started Depression/anxiety:  Both somewhat controlled Sleep hygiene:  varies Family history of headache:  No  Vision checked last year.  07/06/16 CMP: Na 141, K 4.3, Cl 104, CO2 28, glucose 94, BUN 12, Cr 0.75, t. bili 0.6, ALP 75, AST 94 and ALT 58.  PAST MEDICAL HISTORY: Past Medical History:  Diagnosis Date  . Depression   . History of chicken pox   . History of kidney stones   . Thyroid disease    hypothyroid    PAST SURGICAL HISTORY: Past Surgical History:  Procedure Laterality Date  . KNEE ARTHROSCOPY Right 2007  . TONSILLECTOMY  2010    MEDICATIONS: Current Outpatient Prescriptions on File Prior to Visit  Medication Sig Dispense Refill  . Ascorbic Acid (VITAMIN C) 1000 MG tablet Take 1,000 mg by mouth daily.    . butalbital-acetaminophen-caffeine (FIORICET/CODEINE) 50-325-40-30 MG capsule Take 1 capsule by mouth every 6 (six) hours as needed for migraine. (Patient not taking: Reported on 12/26/2016) 3 capsule 0  . Ferrous Sulfate (IRON) 28 MG TABS Take 2 tablets by mouth daily.    Marland Kitchen lamoTRIgine (LAMICTAL) 150 MG tablet Take 1 tablet by mouth daily.  0  . levocetirizine (XYZAL) 5 MG tablet Take 1 tablet (5 mg total) by mouth every evening. (Patient not taking: Reported on 12/26/2016) 30 tablet 3  . levothyroxine (SYNTHROID, LEVOTHROID) 125 MCG tablet Take 1 tablet (125 mcg total) by mouth daily. 30 tablet 5  . Multiple Vitamins-Minerals (MULTIVITAMIN WITH MINERALS) tablet Take 1 tablet by mouth daily.    Marland Kitchen  omeprazole (PRILOSEC) 20 MG capsule Take 2 capsules (40 mg total) by mouth daily. 60 capsule 5  . ondansetron (ZOFRAN ODT) 4 MG disintegrating tablet Take 1 tablet (4 mg total) by mouth every 8  (eight) hours as needed for nausea or vomiting. (Patient not taking: Reported on 12/26/2016) 20 tablet 0  . sertraline (ZOLOFT) 50 MG tablet Take 2 tablets by mouth daily.  0  . SUMAtriptan (IMITREX) 50 MG tablet Take 1 tablet (50 mg total) by mouth every 2 (two) hours as needed for migraine. May repeat in 2 hours if headache persists or recurs. (Patient not taking: Reported on 12/26/2016) 10 tablet 0  . traZODone (DESYREL) 100 MG tablet Take 100 mg by mouth at bedtime as needed.  0   No current facility-administered medications on file prior to visit.     ALLERGIES: Allergies  Allergen Reactions  . Augmentin [Amoxicillin-Pot Clavulanate]     hives    FAMILY HISTORY: Family History  Problem Relation Age of Onset  . Cancer Paternal Aunt        colon  . Cancer Paternal Uncle        "small cell cancer"  . Diabetes Maternal Grandfather   . Arthritis Maternal Grandfather   . Arthritis Maternal Grandmother   . Breast cancer Maternal Grandmother   . Arthritis Paternal Grandmother   . Arthritis Paternal Grandfather     SOCIAL HISTORY: Social History   Social History  . Marital status: Single    Spouse name: N/A  . Number of children: N/A  . Years of education: 27   Occupational History  . Not on file.   Social History Main Topics  . Smoking status: Never Smoker  . Smokeless tobacco: Never Used  . Alcohol use No  . Drug use: No  . Sexual activity: Not on file   Other Topics Concern  . Not on file   Social History Narrative   Unemployed   Lives with Dad   Seeking work   Only child   Dog 3   Lives in a one story house with father, has a cat-12/26/16-sjb    REVIEW OF SYSTEMS: Constitutional: No fevers, chills, or sweats, no generalized fatigue, change in appetite Eyes: No visual changes, double vision, eye pain Ear, nose and throat: No hearing loss, ear pain, nasal congestion, sore throat Cardiovascular: No chest pain, palpitations Respiratory:  No shortness of  breath at rest or with exertion, wheezes GastrointestinaI: No nausea, vomiting, diarrhea, abdominal pain, fecal incontinence Genitourinary:  No dysuria, urinary retention or frequency Musculoskeletal:  No neck pain, back pain Integumentary: No rash, pruritus, skin lesions Neurological: as above Psychiatric: No depression, insomnia, anxiety Endocrine: No palpitations, fatigue, diaphoresis, mood swings, change in appetite, change in weight, increased thirst Hematologic/Lymphatic:  No purpura, petechiae. Allergic/Immunologic: no itchy/runny eyes, nasal congestion, recent allergic reactions, rashes  PHYSICAL EXAM: Vitals:   12/26/16 1526  BP: 110/88  Pulse: (!) 110  SpO2: 97%   General: No acute distress.  Patient appears well-groomed.  Morbidly obese Head:  Normocephalic/atraumatic Eyes:  fundi examined but not visualized Neck: supple, no paraspinal tenderness, full range of motion Back: No paraspinal tenderness Heart: regular rate and rhythm Lungs: Clear to auscultation bilaterally. Vascular: No carotid bruits. Neurological Exam: Mental status: alert and oriented to person, place, and time, recent and remote memory intact, fund of knowledge intact, attention and concentration intact, speech fluent and not dysarthric, language intact. Cranial nerves: CN I: not tested CN II: pupils equal, round and  reactive to light, visual fields intact CN III, IV, VI:  full range of motion, no nystagmus, no ptosis CN V: facial sensation intact CN VII: upper and lower face symmetric CN VIII: hearing intact CN IX, X: gag intact, uvula midline CN XI: sternocleidomastoid and trapezius muscles intact CN XII: tongue midline Bulk & Tone: normal, no fasciculations. Motor:  5/5 throughout  Sensation: temperature and vibration sensation intact. Deep Tendon Reflexes:  2+ throughout, toes downgoing.  Finger to nose testing:  Without dysmetria.  Heel to shin:  Without dysmetria.  Gait:  Normal station  and stride.  Able to turn. Romberg negative.  IMPRESSION: Chronic migraine without aura Morbid obesity (BMI 53.78)  PLAN: 1.  Start topiramate  at bedtime.  Side effects discussed. Advised not to get pregnant.  She may contact us in 4 weeks if she wishes to increase dose. 2. Continue Excedrin Migraine for abortive therapy, limited to no more than 2 days out of the week. 3.  Lifestyle modification:  Caffeine cessation, exercise, hydration, diet/weight loss, sleep hygiene 4.  Consider magnesium, riboflavin and CoQ10 5.  Get annual vision check 6.  Follow up in 3 months.  Thank you for allowing me to take part in the care of this patient.  Shon Millet, DO  CC:  [name], NP

## 2016-12-31 DIAGNOSIS — F4322 Adjustment disorder with anxiety: Secondary | ICD-10-CM | POA: Diagnosis not present

## 2017-01-04 DIAGNOSIS — F331 Major depressive disorder, recurrent, moderate: Secondary | ICD-10-CM | POA: Diagnosis not present

## 2017-01-08 DIAGNOSIS — F4322 Adjustment disorder with anxiety: Secondary | ICD-10-CM | POA: Diagnosis not present

## 2017-01-14 DIAGNOSIS — F4322 Adjustment disorder with anxiety: Secondary | ICD-10-CM | POA: Diagnosis not present

## 2017-01-21 DIAGNOSIS — F4322 Adjustment disorder with anxiety: Secondary | ICD-10-CM | POA: Diagnosis not present

## 2017-01-28 DIAGNOSIS — F4322 Adjustment disorder with anxiety: Secondary | ICD-10-CM | POA: Diagnosis not present

## 2017-02-04 DIAGNOSIS — F4322 Adjustment disorder with anxiety: Secondary | ICD-10-CM | POA: Diagnosis not present

## 2017-02-07 ENCOUNTER — Telehealth: Payer: Self-pay | Admitting: Family

## 2017-02-07 NOTE — Telephone Encounter (Signed)
° °  Relation to NG:EXBMpt:self Call back number:[phone] [facility] [place] [address] [phone] (Phone) [phone] (Fax)     Reason for call:  Patient requesting levothyroxine (SYNTHROID, LEVOTHROID) 125 MCG tablet refill, please advise

## 2017-02-11 DIAGNOSIS — F4322 Adjustment disorder with anxiety: Secondary | ICD-10-CM | POA: Diagnosis not present

## 2017-02-12 MED ORDER — LEVOTHYROXINE SODIUM 125 MCG PO TABS: 125.0000 ug | ORAL_TABLET | Freq: Every day | ORAL | 0 refills | Status: DC

## 2017-02-12 NOTE — Telephone Encounter (Signed)
30 Day supply sent to pharmacy. Pt past due for physical; cancelled last visit. Mychart message sent to pt.

## 2017-02-18 DIAGNOSIS — F4322 Adjustment disorder with anxiety: Secondary | ICD-10-CM | POA: Diagnosis not present

## 2017-02-19 ENCOUNTER — Ambulatory Visit: Payer: BLUE CROSS/BLUE SHIELD | Admitting: Family

## 2017-02-20 ENCOUNTER — Ambulatory Visit (HOSPITAL_BASED_OUTPATIENT_CLINIC_OR_DEPARTMENT_OTHER)
Admission: RE | Admit: 2017-02-20 | Discharge: 2017-02-20 | Disposition: A | Discharge status: Home / Self Care | Payer: BLUE CROSS/BLUE SHIELD | Source: Ambulatory Visit | Attending: Medical | Admitting: Medical

## 2017-02-20 ENCOUNTER — Encounter: Payer: Self-pay | Admitting: Medical

## 2017-02-20 ENCOUNTER — Ambulatory Visit: Payer: BLUE CROSS/BLUE SHIELD | Admitting: Medical

## 2017-02-20 VITALS — BP 115/58 | HR 100 | Temp 97.6°F | Resp 16 | Ht 67.0 in | Wt 353.4 lb

## 2017-02-20 DIAGNOSIS — R52 Pain, unspecified: Secondary | ICD-10-CM | POA: Diagnosis not present

## 2017-02-20 DIAGNOSIS — R079 Chest pain, unspecified: Secondary | ICD-10-CM | POA: Diagnosis not present

## 2017-02-20 DIAGNOSIS — M25572 Pain in left ankle and joints of left foot: Secondary | ICD-10-CM | POA: Diagnosis not present

## 2017-02-20 DIAGNOSIS — J029 Acute pharyngitis, unspecified: Secondary | ICD-10-CM

## 2017-02-20 DIAGNOSIS — M940 Chondrocostal junction syndrome [Tietze]: Secondary | ICD-10-CM | POA: Diagnosis not present

## 2017-02-20 DIAGNOSIS — G8929 Other chronic pain: Secondary | ICD-10-CM | POA: Insufficient documentation

## 2017-02-20 DIAGNOSIS — M7989 Other specified soft tissue disorders: Secondary | ICD-10-CM | POA: Diagnosis not present

## 2017-02-20 LAB — POCT INFLUENZA A/B
Influenza A, POC: NEGATIVE
Influenza B, POC: NEGATIVE

## 2017-02-20 LAB — POCT RAPID STREP A (OFFICE): Rapid Strep A Screen: NEGATIVE

## 2017-02-20 MED ORDER — FLUTICASONE PROPIONATE 50 MCG/ACT NA SUSP: 2.0000 | Freq: Every day | NASAL | 1 refills | Status: DC

## 2017-02-20 MED ORDER — OMEPRAZOLE 20 MG PO CPDR: 40.0000 mg | DELAYED_RELEASE_CAPSULE | Freq: Every day | ORAL | 5 refills | Status: DC

## 2017-02-20 MED ORDER — ALBUTEROL SULFATE HFA 108 (90 BASE) MCG/ACT IN AERS: 2.0000 | INHALATION_SPRAY | Freq: Four times a day (QID) | RESPIRATORY_TRACT | 0 refills | Status: DC | PRN

## 2017-02-20 MED ORDER — AZITHROMYCIN 250 MG PO TABS: ORAL_TABLET | ORAL | 0 refills | Status: DC

## 2017-02-20 MED ORDER — BENZONATATE 100 MG PO CAPS: 100.0000 mg | ORAL_CAPSULE | Freq: Three times a day (TID) | ORAL | 0 refills | Status: DC | PRN

## 2017-02-20 NOTE — Patient Instructions (Addendum)
Your rapid strep test and rapid flu test were both negative.  By exam and recent history appears that you had viral syndrome followed by sinus infection and possible bronchitis.  Going to prescribe azithromycin antibiotic today.  For cough prescription of benzonatate.  For nasal congestion Flonase nasal spray.  You do appear to have probable costochondritis based on reproducible pain on exam.  Also your EKG looked normal.  Cartilage inflammation pain so can  use ibuprofen over-the-counter as directed.  I did place x-ray of left ankle today.  Also place referral to sports medicine.  Over the long holiday weekend if you have wheezing or shortness of breath then use albuterol inhaler as directed.   Follow-up in 7-10 days or as needed.

## 2017-02-20 NOTE — Progress Notes (Signed)
Subjective:    Patient ID: Margaret Golden, female    DOB: 1990-12-12, 26 y.o.   MRN: [id number]  HPI  Pt in with sinus pressure, chest congestion, coughing, and mild and some body aches. Also fatigued. Body aches over the weekend. Symptoms overall present for one week.   Pt states cough occasionally productive. Most of time dry.   No wheezing. Some mild chest pressure with feeling like could not take full deep breath. Mild symptoms today compared to this weekend. Some hx of inhaler use following upper respiratory illness.  Mid sore throat.   Left ankle pain for years since youth. Recurrent sprain injuries. Still has pain.  Review of Systems  Constitutional: Negative for chills and fatigue.  HENT: Positive for congestion, sinus pressure and sinus pain. Negative for ear discharge, facial swelling, mouth sores and postnasal drip.   Respiratory: Positive for cough. Negative for chest tightness, shortness of breath and wheezing.   Cardiovascular: Negative for chest pain and palpitations.       Atypical chest wall discomfort over the weekend.  See HPI.  Also see physical exam regarding costochondral pain on palpation.  Gastrointestinal: Negative for abdominal distention, abdominal pain, blood in stool, constipation, diarrhea and vomiting.  Genitourinary: Negative for dysuria, frequency, hematuria, pelvic pain and urgency.  Musculoskeletal: Positive for myalgias. Negative for back pain.  Skin: Negative for color change and rash.  Neurological: Negative for dizziness, speech difficulty, weakness and headaches.  Hematological: Negative for adenopathy. Does not bruise/bleed easily.  Psychiatric/Behavioral: Negative for behavioral problems, confusion, self-injury and suicidal ideas. The patient is not nervous/anxious.    Past Medical History:  Diagnosis Date  . Depression   . History of chicken pox   . History of kidney stones   . Thyroid disease    hypothyroid     Social History    Socioeconomic History  . Marital status: Single    Spouse name: Not on file  . Number of children: Not on file  . Years of education: 56  . Highest education level: Not on file  Social Needs  . Financial resource strain: Not on file  . Food insecurity - worry: Not on file  . Food insecurity - inability: Not on file  . Transportation needs - medical: Not on file  . Transportation needs - non-medical: Not on file  Occupational History  . Not on file  Tobacco Use  . Smoking status: Never Smoker  . Smokeless tobacco: Never Used  Substance and Sexual Activity  . Alcohol use: No    Alcohol/week: 0.0 oz  . Drug use: No  . Sexual activity: Not on file  Other Topics Concern  . Not on file  Social History Narrative   Unemployed   Lives with Dad   Seeking work   Only child   Dog 3   Lives in a one story house with father, has a cat-12/26/16-sjb    Past Surgical History:  Procedure Laterality Date  . KNEE ARTHROSCOPY Right 2007  . TONSILLECTOMY  2010    Family History  Problem Relation Age of Onset  . Cancer Paternal Aunt        colon  . Cancer Paternal Uncle        "small cell cancer"  . Diabetes Maternal Grandfather   . Arthritis Maternal Grandfather   . Arthritis Maternal Grandmother   . Breast cancer Maternal Grandmother   . Arthritis Paternal Grandmother   . Arthritis Paternal Grandfather  Allergies  Allergen Reactions  . Augmentin [Amoxicillin-Pot Clavulanate]     hives    Current Outpatient Medications on File Prior to Visit  Medication Sig Dispense Refill  . lamoTRIgine (LAMICTAL) 150 MG tablet Take 1 tablet by mouth daily.  0  . levocetirizine (XYZAL) 5 MG tablet Take 1 tablet (5 mg total) by mouth every evening. 30 tablet 3  . Multiple Vitamins-Minerals (MULTIVITAMIN WITH MINERALS) tablet Take 1 tablet by mouth daily.    . sertraline (ZOLOFT) 50 MG tablet Take 2 tablets by mouth daily.  0  . topiramate (TOPAMAX) 50 MG tablet Take 1 tablet (50 mg  total) by mouth at bedtime. 30 tablet 2  . Ascorbic Acid (VITAMIN C) 1000 MG tablet Take 1,000 mg by mouth daily.    . butalbital-acetaminophen-caffeine (FIORICET/CODEINE) 50-325-40-30 MG capsule Take 1 capsule by mouth every 6 (six) hours as needed for migraine. (Patient not taking: Reported on 12/26/2016) 3 capsule 0  . Ferrous Sulfate (IRON) 28 MG TABS Take 2 tablets by mouth daily.    Marland Kitchen. gabapentin (NEURONTIN) 300 MG capsule Take 600 mg by mouth at bedtime.  0  . levothyroxine (SYNTHROID, LEVOTHROID) 125 MCG tablet Take 1 tablet (125 mcg total) daily by mouth. 30 tablet 0  . ondansetron (ZOFRAN ODT) 4 MG disintegrating tablet Take 1 tablet (4 mg total) by mouth every 8 (eight) hours as needed for nausea or vomiting. (Patient not taking: Reported on 12/26/2016) 20 tablet 0  . SUMAtriptan (IMITREX) 50 MG tablet Take 1 tablet (50 mg total) by mouth every 2 (two) hours as needed for migraine. May repeat in 2 hours if headache persists or recurs. (Patient not taking: Reported on 12/26/2016) 10 tablet 0  . traZODone (DESYREL) 100 MG tablet Take 100 mg by mouth at bedtime as needed.  0   No current facility-administered medications on file prior to visit.     BP (!) 115/58 (BP Location: Left Arm, Patient Position: Sitting, Cuff Size: Large)   Pulse 100   Temp 97.6 F (36.4 C) (Oral)   Resp 16   Ht 5\' 7"  (1.702 m)   Wt (!) 353 lb 6.4 oz (160.3 kg)   SpO2 99%   BMI 55.35 kg/m        Objective:   Physical Exam  General  Mental Status - Alert. General Appearance - Well groomed. Not in acute distress.  Skin Rashes- No Rashes.  HEENT Head- Normal. Ear Auditory Canal - Left- Normal. Right - Normal.Tympanic Membrane- Left- Normal. Right- Normal. Eye Sclera/Conjunctiva- Left- Normal. Right- Normal. Nose & Sinuses Nasal Mucosa- Left-  Boggy and Congested. Right-  Boggy and  Congested.Bilateral maxillary and frontal sinus pressure. Mouth & Throat Lips: Upper Lip- Normal: no dryness,  cracking, pallor, cyanosis, or vesicular eruption. Lower Lip-Normal: no dryness, cracking, pallor, cyanosis or vesicular eruption. Buccal Mucosa- Bilateral- No Aphthous ulcers. Oropharynx- No Discharge or Erythema. Tonsils: Characteristics- Bilateral-  Mild Erythema or Congestion. Size/Enlargement- Bilateral- No enlargement. Discharge- bilateral-None. In side mouth/lower lip. Few scattered apthous ulcers.   Neck Neck- Supple. No Masses.   Chest and Lung Exam Auscultation: Breath Sounds:-Clear even and unlabored.  Cardiovascular Auscultation:Rythm- Regular, rate and rhythm. Murmurs & Other Heart Sounds:Ausculatation of the heart reveal- No Murmurs.  Lymphatic Head & Neck General Head & Neck Lymphatics: Bilateral: Description- No Localized lymphadenopathy.  Left ankle-pain and stiffness on rom.  Anterior chest- left side costochondral junction tenderness to palpation easily reproduced.  Lower extremity-symmetric calves.  Negative Homans signs.  Assessment & Plan:  Your rapid strep test and rapid flu test were both negative.  By exam and recent history appears that you had viral syndrome followed by sinus infection and possible bronchitis.  Going to prescribe azithromycin antibiotic today.  For cough prescription of benzonatate.  For nasal congestion Flonase nasal spray.  You do appear to have probable costochondritis based on reproducible pain on exam.  Also your EKG looked normal(normal sinus rhythm).  Cartilage inflammation present pain so can use ibuprofen over-the-counter as directed.  I did place x-ray of left ankle today.  Also place referral to sports medicine.  Over the long holiday weekend if you have wheezing or shortness of breath then use albuterol inhaler as directed.   Follow-up in 7-10 days or as needed.  Margaret Golden, Ramon DredgeEdward, PA-C

## 2017-02-25 DIAGNOSIS — F4322 Adjustment disorder with anxiety: Secondary | ICD-10-CM | POA: Diagnosis not present

## 2017-02-27 ENCOUNTER — Encounter: Payer: Self-pay | Admitting: Family

## 2017-02-27 ENCOUNTER — Encounter: Payer: Self-pay | Admitting: Family Medicine

## 2017-02-27 ENCOUNTER — Ambulatory Visit: Payer: BLUE CROSS/BLUE SHIELD | Admitting: Family Medicine

## 2017-02-27 ENCOUNTER — Ambulatory Visit: Payer: BLUE CROSS/BLUE SHIELD | Admitting: Family

## 2017-02-27 VITALS — BP 138/76 | HR 100 | Temp 98.9°F | Resp 18 | Ht 67.0 in | Wt 351.0 lb

## 2017-02-27 DIAGNOSIS — M25572 Pain in left ankle and joints of left foot: Secondary | ICD-10-CM | POA: Diagnosis not present

## 2017-02-27 DIAGNOSIS — R300 Dysuria: Secondary | ICD-10-CM

## 2017-02-27 DIAGNOSIS — G8929 Other chronic pain: Secondary | ICD-10-CM

## 2017-02-27 DIAGNOSIS — N3001 Acute cystitis with hematuria: Secondary | ICD-10-CM | POA: Diagnosis not present

## 2017-02-27 LAB — POC URINALSYSI DIPSTICK (AUTOMATED)
Bilirubin, UA: NEGATIVE
Blood, UA: NEGATIVE
Glucose, UA: NEGATIVE
Ketones, UA: NEGATIVE
Leukocytes, UA: NEGATIVE
NITRITE UA: NEGATIVE
PH UA: 6 (ref 5.0–8.0)
PROTEIN UA: NEGATIVE
Spec Grav, UA: 1.025 (ref 1.010–1.025)
UROBILINOGEN UA: 0.2 U/dL

## 2017-02-27 MED ORDER — NITROFURANTOIN MACROCRYSTAL 100 MG PO CAPS: 100.0000 mg | ORAL_CAPSULE | Freq: Two times a day (BID) | ORAL | 0 refills | Status: DC

## 2017-02-27 NOTE — Patient Instructions (Addendum)
Please begin macrodantin for possible urinary tract infection.  Call if new/worsening symptoms or if not improved in 2-3 days.

## 2017-02-27 NOTE — Patient Instructions (Signed)
You have chronic ankle instability and mild peroneal tendinopathy. Ice the area for 15 minutes at a time, 3-4 times a day Aleve 2 tabs twice a day with food OR ibuprofen 3 tabs three times a day with food for pain and inflammation. Use laceup brace when up and walking around to help with stability. Start theraband strengthening exercises when directed - once a day 3 sets of 10. Consider physical therapy for strengthening and balance exercises. Arch supports are important - something like spencos, dr. Jari Sportsmanscholls active series, our green insoles. Avoid flat shoes, barefoot walking as much as possible. Follow up in 6 weeks for reevaluation.

## 2017-02-27 NOTE — Progress Notes (Signed)
Subjective:    Patient ID: Margaret Golden A Locust, female    DOB: 11/18/1990, [age] y.o.   MRN: [id number]  HPI  Patient is a 26 year old female who presents today with chief complaint of dysuria.  She reports that symptoms began 2 days ago. Reports associated frequency. Denies associated fever, low back pain. Has had some "pink" tint to her urine several times I the last few days.   Review of Systems See HPI  Past Medical History:  Diagnosis Date  . Depression   . History of chicken pox   . History of kidney stones   . Thyroid disease    hypothyroid     Social History   Socioeconomic History  . Marital status: Single    Spouse name: Not on file  . Number of children: Not on file  . Years of education: 5712  . Highest education level: Not on file  Social Needs  . Financial resource strain: Not on file  . Food insecurity - worry: Not on file  . Food insecurity - inability: Not on file  . Transportation needs - medical: Not on file  . Transportation needs - non-medical: Not on file  Occupational History  . Not on file  Tobacco Use  . Smoking status: Never Smoker  . Smokeless tobacco: Never Used  Substance and Sexual Activity  . Alcohol use: No    Alcohol/week: 0.0 oz  . Drug use: No  . Sexual activity: Not on file  Other Topics Concern  . Not on file  Social History Narrative   Unemployed   Lives with Dad   Seeking work   Only child   Dog 3   Lives in a one story house with father, has a cat-12/26/16-sjb    Past Surgical History:  Procedure Laterality Date  . KNEE ARTHROSCOPY Right 2007  . TONSILLECTOMY  2010    Family History  Problem Relation Age of Onset  . Cancer Paternal Aunt        colon  . Cancer Paternal Uncle        "small cell cancer"  . Diabetes Maternal Grandfather   . Arthritis Maternal Grandfather   . Arthritis Maternal Grandmother   . Breast cancer Maternal Grandmother   . Arthritis Paternal Grandmother   . Arthritis Paternal Grandfather      Allergies  Allergen Reactions  . Augmentin [Amoxicillin-Pot Clavulanate]     hives    Current Outpatient Medications on File Prior to Visit  Medication Sig Dispense Refill  . albuterol (PROVENTIL HFA;VENTOLIN HFA) 108 (90 Base) MCG/ACT inhaler Inhale 2 puffs into the lungs every 6 (six) hours as needed for wheezing or shortness of breath. 1 Inhaler 0  . Ascorbic Acid (VITAMIN C) 1000 MG tablet Take 1,000 mg by mouth daily.    Marland Kitchen. azithromycin (ZITHROMAX) 250 MG tablet Take 2 tablets by mouth on day 1, followed by 1 tablet by mouth daily for 4 days. 6 tablet 0  . benzonatate (TESSALON) 100 MG capsule Take 1 capsule (100 mg total) by mouth 3 (three) times daily as needed for cough. 21 capsule 0  . butalbital-acetaminophen-caffeine (FIORICET/CODEINE) 50-325-40-30 MG capsule Take 1 capsule by mouth every 6 (six) hours as needed for migraine. 3 capsule 0  . Ferrous Sulfate (IRON) 28 MG TABS Take 2 tablets by mouth daily.    . fluticasone (FLONASE) 50 MCG/ACT nasal spray Place 2 sprays into both nostrils daily. 16 g 1  . gabapentin (NEURONTIN) 300 MG capsule Take 600  mg by mouth at bedtime.  0  . lamoTRIgine (LAMICTAL) 150 MG tablet Take 1 tablet by mouth daily.  0  . levocetirizine (XYZAL) 5 MG tablet Take 1 tablet (5 mg total) by mouth every evening. 30 tablet 3  . levothyroxine (SYNTHROID, LEVOTHROID) 125 MCG tablet Take 1 tablet (125 mcg total) daily by mouth. 30 tablet 0  . Multiple Vitamins-Minerals (MULTIVITAMIN WITH MINERALS) tablet Take 1 tablet by mouth daily.    Marland Kitchen. omeprazole (PRILOSEC) 20 MG capsule Take 2 capsules (40 mg total) by mouth daily. 60 capsule 5  . ondansetron (ZOFRAN ODT) 4 MG disintegrating tablet Take 1 tablet (4 mg total) by mouth every 8 (eight) hours as needed for nausea or vomiting. 20 tablet 0  . sertraline (ZOLOFT) 50 MG tablet Take 2 tablets by mouth daily.  0  . SUMAtriptan (IMITREX) 50 MG tablet Take 1 tablet (50 mg total) by mouth every 2 (two) hours as needed  for migraine. May repeat in 2 hours if headache persists or recurs. 10 tablet 0  . topiramate (TOPAMAX) 50 MG tablet Take 1 tablet (50 mg total) by mouth at bedtime. 30 tablet 2  . traZODone (DESYREL) 100 MG tablet Take 100 mg by mouth at bedtime as needed.  0   No current facility-administered medications on file prior to visit.     BP 138/76 (BP Location: Right Arm, Patient Position: Sitting, Cuff Size: Small)   Pulse 100   Temp 98.9 F (37.2 C) (Oral)   Resp 18   Ht 5\' 7"  (1.702 m)   Wt (!) 351 lb (159.2 kg)   LMP 02/14/2017   SpO2 99%   BMI 54.97 kg/m       Objective:   Physical Exam  Constitutional: She appears well-developed and well-nourished.  Cardiovascular: Normal rate, regular rhythm and normal heart sounds.  No murmur heard. Pulmonary/Chest: Effort normal and breath sounds normal. No respiratory distress. She has no wheezes.  Abdominal: Soft. Bowel sounds are normal. She exhibits no distension. There is no tenderness. There is no CVA tenderness.  Psychiatric: She has a normal mood and affect. Her behavior is normal. Judgment and thought content normal.          Assessment & Plan:  UTI-urinalysis is unremarkable however patient is quite symptomatic.  Will initiate Macrodantin twice daily.  Send urine for culture.  She is advised to call if new or worsening symptoms or if symptoms are not improved in 2-3 days.  Patient verbalizes understanding.

## 2017-02-28 ENCOUNTER — Encounter: Payer: Self-pay | Admitting: Family Medicine

## 2017-02-28 DIAGNOSIS — M19079 Primary osteoarthritis, unspecified ankle and foot: Secondary | ICD-10-CM | POA: Insufficient documentation

## 2017-02-28 DIAGNOSIS — M25572 Pain in left ankle and joints of left foot: Secondary | ICD-10-CM | POA: Insufficient documentation

## 2017-02-28 NOTE — Assessment & Plan Note (Signed)
2/2 chronic instability and mild peroneal tendinopathy.  Icing, aleve or ibuprofen.  ASO to help with stability.  Start home exercise program with theraband.  Consider physical therapy.  Stressed importance of arch supports as well.  Avoid flat shoes, barefoot walking.  F/u in 6 weeks.

## 2017-02-28 NOTE — Progress Notes (Signed)
PCP and consultation requested by: [name]'Sullivan, Melissa, NP  Subjective:   HPI: Patient is a 26 y.o. female here for left ankle pain.  Patient reports she's had several injuries of left ankle dating back to middle school. States she's had to wear a boot about every 6 months due to injuries, pain of this ankle. Pain level 3/10 but up to 7/10 and sharp laterally. Associated swelling. She did physical therapy remotely. Current pain worse the past week. No skin changes, numbness.  Past Medical History:  Diagnosis Date  . Depression   . History of chicken pox   . History of kidney stones   . Thyroid disease    hypothyroid    Current Outpatient Medications on File Prior to Visit  Medication Sig Dispense Refill  . albuterol (PROVENTIL HFA;VENTOLIN HFA) 108 (90 Base) MCG/ACT inhaler Inhale 2 puffs into the lungs every 6 (six) hours as needed for wheezing or shortness of breath. 1 Inhaler 0  . Ascorbic Acid (VITAMIN C) 1000 MG tablet Take 1,000 mg by mouth daily.    Marland Kitchen. azithromycin (ZITHROMAX) 250 MG tablet Take 2 tablets by mouth on day 1, followed by 1 tablet by mouth daily for 4 days. 6 tablet 0  . benzonatate (TESSALON) 100 MG capsule Take 1 capsule (100 mg total) by mouth 3 (three) times daily as needed for cough. 21 capsule 0  . butalbital-acetaminophen-caffeine (FIORICET/CODEINE) 50-325-40-30 MG capsule Take 1 capsule by mouth every 6 (six) hours as needed for migraine. 3 capsule 0  . Ferrous Sulfate (IRON) 28 MG TABS Take 2 tablets by mouth daily.    . fluticasone (FLONASE) 50 MCG/ACT nasal spray Place 2 sprays into both nostrils daily. 16 g 1  . gabapentin (NEURONTIN) 300 MG capsule Take 600 mg by mouth at bedtime.  0  . lamoTRIgine (LAMICTAL) 150 MG tablet Take 1 tablet by mouth daily.  0  . levocetirizine (XYZAL) 5 MG tablet Take 1 tablet (5 mg total) by mouth every evening. 30 tablet 3  . levothyroxine (SYNTHROID, LEVOTHROID) 125 MCG tablet Take 1 tablet (125 mcg total) daily by  mouth. 30 tablet 0  . Multiple Vitamins-Minerals (MULTIVITAMIN WITH MINERALS) tablet Take 1 tablet by mouth daily.    . nitrofurantoin (MACRODANTIN) 100 MG capsule Take 1 capsule (100 mg total) by mouth 2 (two) times daily. 10 capsule 0  . omeprazole (PRILOSEC) 20 MG capsule Take 2 capsules (40 mg total) by mouth daily. 60 capsule 5  . ondansetron (ZOFRAN ODT) 4 MG disintegrating tablet Take 1 tablet (4 mg total) by mouth every 8 (eight) hours as needed for nausea or vomiting. 20 tablet 0  . sertraline (ZOLOFT) 50 MG tablet Take 2 tablets by mouth daily.  0  . SUMAtriptan (IMITREX) 50 MG tablet Take 1 tablet (50 mg total) by mouth every 2 (two) hours as needed for migraine. May repeat in 2 hours if headache persists or recurs. 10 tablet 0  . topiramate (TOPAMAX) 50 MG tablet Take 1 tablet (50 mg total) by mouth at bedtime. 30 tablet 2  . traZODone (DESYREL) 100 MG tablet Take 100 mg by mouth at bedtime as needed.  0   No current facility-administered medications on file prior to visit.     Past Surgical History:  Procedure Laterality Date  . KNEE ARTHROSCOPY Right 2007  . TONSILLECTOMY  2010    Allergies  Allergen Reactions  . Augmentin [Amoxicillin-Pot Clavulanate]     hives    Social History   Socioeconomic History  .  Marital status: Single    Spouse name: Not on file  . Number of children: Not on file  . Years of education: 9312  . Highest education level: Not on file  Social Needs  . Financial resource strain: Not on file  . Food insecurity - worry: Not on file  . Food insecurity - inability: Not on file  . Transportation needs - medical: Not on file  . Transportation needs - non-medical: Not on file  Occupational History  . Not on file  Tobacco Use  . Smoking status: Never Smoker  . Smokeless tobacco: Never Used  Substance and Sexual Activity  . Alcohol use: No    Alcohol/week: 0.0 oz  . Drug use: No  . Sexual activity: Not on file  Other Topics Concern  . Not on  file  Social History Narrative   Unemployed   Lives with Dad   Seeking work   Only child   Dog 3   Lives in a one story house with father, has a cat-12/26/16-sjb    Family History  Problem Relation Age of Onset  . Cancer Paternal Aunt        colon  . Cancer Paternal Uncle        "small cell cancer"  . Diabetes Maternal Grandfather   . Arthritis Maternal Grandfather   . Arthritis Maternal Grandmother   . Breast cancer Maternal Grandmother   . Arthritis Paternal Grandmother   . Arthritis Paternal Grandfather     BP (!) 145/101   Pulse (!) 114   Ht 5\' 7"  (1.702 m)   Wt (!) 351 lb (159.2 kg)   LMP 02/14/2017   BMI 54.97 kg/m   Review of Systems: See HPI above.     Objective:  Physical Exam:  Gen: NAD, comfortable in exam room  Left ankle: Mild lateral swelling.  No bruising, other deformity. Mild limitation ROM all directions with 5/5 strength. TTP over peroneal tendons, ATFL.  No navicular, medial malleolus, base 5th, other tenderness. 1+ ant drawer and talar tilt.   Negative syndesmotic compression. Thompsons test negative. NV intact distally.  Right ankle: No gross deformity, swelling, ecchymoses FROM with full strength. No TTP NV intact distally.  MSK u/s left ankle: Peroneal tendons intact without tenosynovitis or thickening.  Lateral malleolus, talus normal without fracture.   Assessment & Plan:  1. Left ankle pain - 2/2 chronic instability and mild peroneal tendinopathy.  Icing, aleve or ibuprofen.  ASO to help with stability.  Start home exercise program with theraband.  Consider physical therapy.  Stressed importance of arch supports as well.  Avoid flat shoes, barefoot walking.  F/u in 6 weeks.

## 2017-03-01 DIAGNOSIS — F331 Major depressive disorder, recurrent, moderate: Secondary | ICD-10-CM | POA: Diagnosis not present

## 2017-03-04 DIAGNOSIS — F4322 Adjustment disorder with anxiety: Secondary | ICD-10-CM | POA: Diagnosis not present

## 2017-03-11 DIAGNOSIS — F4322 Adjustment disorder with anxiety: Secondary | ICD-10-CM | POA: Diagnosis not present

## 2017-03-15 ENCOUNTER — Ambulatory Visit: Payer: BLUE CROSS/BLUE SHIELD | Admitting: Internal Medicine

## 2017-03-15 ENCOUNTER — Encounter: Payer: Self-pay | Admitting: Internal Medicine

## 2017-03-15 VITALS — BP 138/84 | HR 89 | Temp 97.5°F | Resp 14 | Ht 67.0 in | Wt 347.0 lb

## 2017-03-15 DIAGNOSIS — B349 Viral infection, unspecified: Secondary | ICD-10-CM

## 2017-03-15 NOTE — Patient Instructions (Addendum)
Rest, follow bland diet, Tylenol as needed  If you have  cough:  Take Mucinex DM twice a day as needed until better  For nasal congestion: Use Flonase : 2 nasal sprays on each side of the nose in the morning until you feel better   Avoid decongestants such as  Pseudoephedrine or phenylephrine     Call if not gradually better over the next  10 days  Call anytime if the symptoms are severe

## 2017-03-15 NOTE — Progress Notes (Signed)
Subjective:    Patient ID: Margaret Golden, female    DOB: 02/20/1991, 26 y.o.   MRN: [id number]  DOS:  03/15/2017 Type of visit - description : acute Interval history: She got sick a week ago: Nausea, vomiting, diarrhea.  Stools were a mix of watery and soft, no blood in the stools. GI symptoms gradually decreased, now they are essentially gone except for mild nausea. About 4 days ago she started to develop respiratory symptoms: Sore throat, hoarse voice, mild subjective fever, sinus congestion with clear to cloudy nasal discharge. Her father was sick with similar symptoms before her. Denies  recent trips outside the county.   BP Readings from Last 3 Encounters:  03/15/17 138/84  02/27/17 (!) 145/101  02/27/17 138/76     Review of Systems No chest pain, mild cough, history of asthma that that does not seem to be recently exacerbated. Migraines are quiet at this time She did have mid abdominal crampy like pain when GI symptoms were active, that is basically resolved.  Past Medical History:  Diagnosis Date  . Depression   . History of chicken pox   . History of kidney stones   . Thyroid disease    hypothyroid    Past Surgical History:  Procedure Laterality Date  . KNEE ARTHROSCOPY Right 2007  . TONSILLECTOMY  2010    Social History   Socioeconomic History  . Marital status: Single    Spouse name: Not on file  . Number of children: Not on file  . Years of education: 5912  . Highest education level: Not on file  Social Needs  . Financial resource strain: Not on file  . Food insecurity - worry: Not on file  . Food insecurity - inability: Not on file  . Transportation needs - medical: Not on file  . Transportation needs - non-medical: Not on file  Occupational History  . Not on file  Tobacco Use  . Smoking status: Never Smoker  . Smokeless tobacco: Never Used  Substance and Sexual Activity  . Alcohol use: No    Alcohol/week: 0.0 oz  . Drug use: No  . Sexual  activity: Not on file  Other Topics Concern  . Not on file  Social History Narrative   Unemployed   Lives with Dad   Seeking work   Only child   Dog 3   Lives in a one story house with father, has a cat-12/26/16-sjb      Allergies as of 03/15/2017      Reactions   Augmentin [amoxicillin-pot Clavulanate]    hives      Medication List        Accurate as of 03/15/17 11:59 PM. Always use your most recent med list.          albuterol 108 (90 Base) MCG/ACT inhaler Commonly known as:  PROVENTIL HFA;VENTOLIN HFA Inhale 2 puffs into the lungs every 6 (six) hours as needed for wheezing or shortness of breath.   amphetamine-dextroamphetamine 30 MG 24 hr capsule Commonly known as:  ADDERALL XR Take 30 mg by mouth daily.   butalbital-acetaminophen-caffeine 50-325-40-30 MG capsule Commonly known as:  FIORICET/CODEINE Take 1 capsule by mouth every 6 (six) hours as needed for migraine.   fluticasone 50 MCG/ACT nasal spray Commonly known as:  FLONASE Place 2 sprays into both nostrils daily.   gabapentin 300 MG capsule Commonly known as:  NEURONTIN Take 600 mg by mouth at bedtime.   Iron 28 MG Tabs Take 2  tablets by mouth daily.   lamoTRIgine 150 MG tablet Commonly known as:  LAMICTAL Take 1 tablet by mouth daily.   levocetirizine 5 MG tablet Commonly known as:  XYZAL Take 1 tablet (5 mg total) by mouth every evening.   levothyroxine 125 MCG tablet Commonly known as:  SYNTHROID, LEVOTHROID Take 1 tablet (125 mcg total) daily by mouth.   multivitamin with minerals tablet Take 1 tablet by mouth daily.   omeprazole 20 MG capsule Commonly known as:  PRILOSEC Take 2 capsules (40 mg total) by mouth daily.   ondansetron 4 MG disintegrating tablet Commonly known as:  ZOFRAN ODT Take 1 tablet (4 mg total) by mouth every 8 (eight) hours as needed for nausea or vomiting.   sertraline 50 MG tablet Commonly known as:  ZOLOFT Take 2 tablets by mouth daily.   SUMAtriptan 50  MG tablet Commonly known as:  IMITREX Take 1 tablet (50 mg total) by mouth every 2 (two) hours as needed for migraine. May repeat in 2 hours if headache persists or recurs.   topiramate 50 MG tablet Commonly known as:  TOPAMAX Take 1 tablet (50 mg total) by mouth at bedtime.   traZODone 100 MG tablet Commonly known as:  DESYREL Take 100 mg by mouth at bedtime as needed.   vitamin C 1000 MG tablet Take 1,000 mg by mouth daily.          Objective:   Physical Exam BP 138/84 (BP Location: Right Arm, Patient Position: Sitting, Cuff Size: Normal)   Pulse 89   Temp (!) 97.5 F (36.4 C) (Oral)   Resp 14   Ht 5\' 7"  (1.702 m)   Wt (!) 347 lb (157.4 kg)   LMP 02/14/2017   SpO2 98%   BMI 54.35 kg/m  General:   Well developed, morbidly obese appearing in no distress HEENT:  Normocephalic . Face symmetric, atraumatic .  TMs normal, nose is slightly congested, sinuses non-TTP.  Throat symmetric, no red   Lungs:  CTA B Normal respiratory effort, no intercostal retractions, no accessory muscle use. Heart: RRR,  no murmur.  no pretibial edema bilaterally  Abdomen:  Not distended, soft, non-tender. No rebound or rigidity.   Skin: Not pale. Not jaundice Neurologic:  alert & oriented X3.  Speech normal, gait appropriate for age and unassisted Psych--  Cognition and judgment appear intact.  Cooperative with normal attention span and concentration.  Behavior appropriate. No anxious or depressed appearing.     Assessment & Plan:    26 year old female with multiple medical problems including depression, thyroid disease, migraines, morbid obesity, HTN, on multiple medications, status post 2 recent p.o. antibiotics.  Presents with:  Viral syndrome: Symptoms started a week ago, GI symptoms gradually resolved, now she has some respiratory symptoms, likely viral syndrome.  Recommend conservative treatment.  See instructions. BP slightly elevated upon arrival to the office, recheck:  138/84.

## 2017-03-15 NOTE — Progress Notes (Signed)
Pre visit review using our clinic review tool, if applicable. No additional management support is needed unless otherwise documented below in the visit note. 

## 2017-03-18 DIAGNOSIS — F4322 Adjustment disorder with anxiety: Secondary | ICD-10-CM | POA: Diagnosis not present

## 2017-03-22 ENCOUNTER — Other Ambulatory Visit: Payer: Self-pay | Admitting: Family

## 2017-03-22 MED ORDER — LEVOTHYROXINE SODIUM 125 MCG PO TABS: 125.0000 ug | ORAL_TABLET | Freq: Every day | ORAL | 1 refills | Status: DC

## 2017-03-27 DIAGNOSIS — F4322 Adjustment disorder with anxiety: Secondary | ICD-10-CM | POA: Diagnosis not present

## 2017-04-08 DIAGNOSIS — F4322 Adjustment disorder with anxiety: Secondary | ICD-10-CM | POA: Diagnosis not present

## 2017-04-11 ENCOUNTER — Encounter: Payer: Self-pay | Admitting: Family Medicine

## 2017-04-11 ENCOUNTER — Ambulatory Visit (INDEPENDENT_AMBULATORY_CARE_PROVIDER_SITE_OTHER): Payer: BLUE CROSS/BLUE SHIELD | Admitting: Family Medicine

## 2017-04-11 VITALS — BP 157/95 | HR 87 | Ht 67.0 in | Wt 340.0 lb

## 2017-04-11 DIAGNOSIS — G8929 Other chronic pain: Secondary | ICD-10-CM | POA: Diagnosis not present

## 2017-04-11 DIAGNOSIS — M25572 Pain in left ankle and joints of left foot: Secondary | ICD-10-CM

## 2017-04-11 MED ORDER — NITROGLYCERIN 0.2 MG/HR TD PT24: MEDICATED_PATCH | TRANSDERMAL | 1 refills | Status: DC

## 2017-04-11 NOTE — Patient Instructions (Signed)
Continue with the ankle brace. Start physical therapy and do home exercises on days you don't go to therapy. Try nitro patches - 1/4th patch to the ankle, change daily. Icing if needed. Elevation as needed for swelling. Follow up with me in 6 weeks.

## 2017-04-13 ENCOUNTER — Encounter: Payer: Self-pay | Admitting: Family Medicine

## 2017-04-13 NOTE — Progress Notes (Signed)
PCP and consultation requested by: [name], NP  Subjective:   HPI: Patient is a 27 y.o. female here for left ankle pain.  11/28: Patient reports she's had several injuries of left ankle dating back to middle school. States she's had to wear a boot about every 6 months due to injuries, pain of this ankle. Pain level 3/10 but up to 7/10 and sharp laterally. Associated swelling. She did physical therapy remotely. Current pain worse the past week. No skin changes, numbness.  04/11/17: Patient reports she feels about the same. Pain level 7/10 and sharp laterally. Wearing ASO and doing home exercises though not daily.   Some swelling. No skin changes, numbness.  Past Medical History:  Diagnosis Date  . Depression   . History of chicken pox   . History of kidney stones   . Thyroid disease    hypothyroid    Current Outpatient Medications on File Prior to Visit  Medication Sig Dispense Refill  . albuterol (PROVENTIL HFA;VENTOLIN HFA) 108 (90 Base) MCG/ACT inhaler Inhale 2 puffs into the lungs every 6 (six) hours as needed for wheezing or shortness of breath. 1 Inhaler 0  . amphetamine-dextroamphetamine (ADDERALL XR) 30 MG 24 hr capsule Take 30 mg by mouth daily.    . Ascorbic Acid (VITAMIN C) 1000 MG tablet Take 1,000 mg by mouth daily.    . butalbital-acetaminophen-caffeine (FIORICET/CODEINE) 50-325-40-30 MG capsule Take 1 capsule by mouth every 6 (six) hours as needed for migraine. 3 capsule 0  . Ferrous Sulfate (IRON) 28 MG TABS Take 2 tablets by mouth daily.    . fluticasone (FLONASE) 50 MCG/ACT nasal spray Place 2 sprays into both nostrils daily. 16 g 1  . gabapentin (NEURONTIN) 300 MG capsule Take 600 mg by mouth at bedtime.  0  . lamoTRIgine (LAMICTAL) 150 MG tablet Take 1 tablet by mouth daily.  0  . levocetirizine (XYZAL) 5 MG tablet Take 1 tablet (5 mg total) by mouth every evening. 30 tablet 3  . levothyroxine (SYNTHROID, LEVOTHROID) 125 MCG tablet Take 1  tablet (125 mcg total) by mouth daily. 30 tablet 1  . Multiple Vitamins-Minerals (MULTIVITAMIN WITH MINERALS) tablet Take 1 tablet by mouth daily.    Marland Kitchen omeprazole (PRILOSEC) 20 MG capsule Take 2 capsules (40 mg total) by mouth daily. 60 capsule 5  . ondansetron (ZOFRAN ODT) 4 MG disintegrating tablet Take 1 tablet (4 mg total) by mouth every 8 (eight) hours as needed for nausea or vomiting. 20 tablet 0  . sertraline (ZOLOFT) 50 MG tablet Take 2 tablets by mouth daily.  0  . SUMAtriptan (IMITREX) 50 MG tablet Take 1 tablet (50 mg total) by mouth every 2 (two) hours as needed for migraine. May repeat in 2 hours if headache persists or recurs. 10 tablet 0  . topiramate (TOPAMAX) 50 MG tablet Take 1 tablet (50 mg total) by mouth at bedtime. 30 tablet 2  . traZODone (DESYREL) 100 MG tablet Take 100 mg by mouth at bedtime as needed.  0   No current facility-administered medications on file prior to visit.     Past Surgical History:  Procedure Laterality Date  . KNEE ARTHROSCOPY Right 2007  . TONSILLECTOMY  2010    Allergies  Allergen Reactions  . Augmentin [Amoxicillin-Pot Clavulanate]     hives    Social History   Socioeconomic History  . Marital status: Single    Spouse name: Not on file  . Number of children: Not on file  . Years of  education: 8812  . Highest education level: Not on file  Social Needs  . Financial resource strain: Not on file  . Food insecurity - worry: Not on file  . Food insecurity - inability: Not on file  . Transportation needs - medical: Not on file  . Transportation needs - non-medical: Not on file  Occupational History  . Not on file  Tobacco Use  . Smoking status: Never Smoker  . Smokeless tobacco: Never Used  Substance and Sexual Activity  . Alcohol use: No    Alcohol/week: 0.0 oz  . Drug use: No  . Sexual activity: Not on file  Other Topics Concern  . Not on file  Social History Narrative   Unemployed   Lives with Dad   Seeking work   Only  child   Dog 3   Lives in a one story house with father, has a cat-12/26/16-sjb    Family History  Problem Relation Age of Onset  . Cancer Paternal Aunt        colon  . Cancer Paternal Uncle        "small cell cancer"  . Diabetes Maternal Grandfather   . Arthritis Maternal Grandfather   . Arthritis Maternal Grandmother   . Breast cancer Maternal Grandmother   . Arthritis Paternal Grandmother   . Arthritis Paternal Grandfather     BP (!) 157/95   Pulse 87   Ht 5\' 7"  (1.702 m)   Wt (!) 340 lb (154.2 kg)   BMI 53.25 kg/m   Review of Systems: See HPI above.     Objective:  Physical Exam:  Gen: NAD, comfortable in exam room.  Left ankle: Mild lateral swelling.  No bruising, other deformity. Mild limitation all directions with 5/5 strength. TTP over peroneal tendons, ATFL.  No other tenderness. 1+ ant drawer and talar tilt.   Negative syndesmotic compression. Thompsons test negative. NV intact distally.   Assessment & Plan:  1. Left ankle pain - 2/2 chronic instability and peroneal tendinopathy.  She will start physical therapy in addition to home exercise program.  Add nitro patches.  Icing if needed.  Tylenol, aleve or ibuprofen if needed.  F/u in 6 weeks.

## 2017-04-13 NOTE — Assessment & Plan Note (Signed)
2/2 chronic instability and peroneal tendinopathy.  She will start physical therapy in addition to home exercise program.  Add nitro patches.  Icing if needed.  Tylenol, aleve or ibuprofen if needed.  F/u in 6 weeks.

## 2017-04-15 DIAGNOSIS — F4322 Adjustment disorder with anxiety: Secondary | ICD-10-CM | POA: Diagnosis not present

## 2017-04-15 DIAGNOSIS — F331 Major depressive disorder, recurrent, moderate: Secondary | ICD-10-CM | POA: Diagnosis not present

## 2017-04-16 ENCOUNTER — Ambulatory Visit: Payer: BLUE CROSS/BLUE SHIELD | Admitting: Neurology

## 2017-04-16 ENCOUNTER — Encounter: Payer: Self-pay | Admitting: Neurology

## 2017-04-16 VITALS — BP 110/82 | HR 87 | Ht 67.0 in | Wt 345.0 lb

## 2017-04-16 DIAGNOSIS — G43009 Migraine without aura, not intractable, without status migrainosus: Secondary | ICD-10-CM

## 2017-04-16 MED ORDER — TOPIRAMATE 50 MG PO TABS: 50.0000 mg | ORAL_TABLET | Freq: Every day | ORAL | 5 refills | Status: DC

## 2017-04-16 NOTE — Patient Instructions (Signed)
Migraine Recommendations: 1.  Continue topiramate 50mg  at bedtime 2.  Take Excedrin Migraine as needed. 3.  Limit use of pain relievers to no more than 2 days out of the week.  These medications include acetaminophen, ibuprofen, triptans and narcotics.  This will help reduce risk of rebound headaches. 4.  Be aware of common food triggers such as processed sweets, processed foods with nitrites (such as deli meat, hot dogs, sausages), foods with MSG, alcohol (such as wine), chocolate, certain cheeses, certain fruits (dried fruits, bananas, some citrus fruit), vinegar, diet soda. 4.  Avoid caffeine 5.  Routine exercise 6.  Proper sleep hygiene 7.  Stay adequately hydrated with water 8.  Keep a headache diary. 9.  Maintain proper stress management. 10.  Do not skip meals. . 11.  Consider supplements:  Magnesium citrate 400mg  to 600mg  daily, riboflavin 400mg , Coenzyme Q 10 100mg  three times daily 12.  Follow up in 6 months.

## 2017-04-16 NOTE — Progress Notes (Signed)
NEUROLOGY FOLLOW UP OFFICE NOTE  Margaret Golden [id number]  HISTORY OF PRESENT ILLNESS: Margaret Golden is a 27 year old female with depression, nonalcoholic fatty liver disease, hypothyroidism, GERD and morbid obesity who follows up for migraine.  She is accompanied by her father who supplements history.  UPDATE: Improved. Intensity:  7/10 Duration:  2 hours  Frequency:  3 to 4 days headache a month (1 to 2 days are migraines) Current NSAIDS:  no Current analgesics:  Excedrin Migraine Current triptans:  no Current anti-emetic:  no Current muscle relaxants:  no Current anti-anxiolytic:  no Current sleep aide:  trazodone Current Antihypertensive medications:  no Current Antidepressant medications:  sertraline 100mg  Current Anticonvulsant medications:  topiramate 50mg , lamotrigine 150mg , [place] Current Vitamins/Herbal/Supplements:  Vitamin D, vitamin C, MVI Current Antihistamines/Decongestants:  no Other therapy:  no   Caffeine:  Yes.  Trying to wean self off Alcohol:  rarely Smoker:  no Diet:  Does not hydrate.  Drinks soda.  Eats processed/fast food Exercise:  Recently started Depression/anxiety:  Both somewhat controlled Sleep hygiene:  varies  HISTORY: Onset:  Teenager.  Started to become more frequent at age 59. Location:  Band-like distribution Quality:  Pressure, stabbing Initial Intensity:  7-8/10 Aura:  no Prodrome:  no Postdrome:  no Associated symptoms:  Photophobia, phonophobia.  Rarely nausea.  No vomiting or visual disturbance (except for brief double vision on rare occasions).  She has not had any new worse headache of her life, waking up from sleep Initial Duration:  2 hours with Excedrin (otherwise up to 3 days) Initial Frequency:  2 migraines a month, but total of 7 headache days a month. Initial Frequency of abortive medication: once a week Triggers/exacerbating factors:  Caffeine withdrawal Relieving factors:  Excedrin if taken  early Activity:  aggravates   Past NSAIDS:  Ibuprofen, [place] Past analgesics:  Tylenol, Fioricet Past abortive triptans:  Sumatriptan 50mg  Past muscle relaxants:  no Past anti-emetic:  Zofran ODT 4mg  Past antihypertensive medications:  propranolol 60mg  (for hypertension) Past antidepressant medications:  bupropion Past anticonvulsant medications:  no Past vitamins/Herbal/Supplements:  no Other past therapies:  no   Family history of headache:  No  PAST MEDICAL HISTORY: Past Medical History:  Diagnosis Date  . Depression   . History of chicken pox   . History of kidney stones   . Thyroid disease    hypothyroid    MEDICATIONS: Current Outpatient Medications on File Prior to Visit  Medication Sig Dispense Refill  . Gabapentin Enacarbil 600 MG TBCR Take by mouth.    Marland Kitchen albuterol (PROVENTIL HFA;VENTOLIN HFA) 108 (90 Base) MCG/ACT inhaler Inhale 2 puffs into the lungs every 6 (six) hours as needed for wheezing or shortness of breath. 1 Inhaler 0  . amphetamine-dextroamphetamine (ADDERALL XR) 30 MG 24 hr capsule Take 30 mg by mouth daily.    . Ascorbic Acid (VITAMIN C) 1000 MG tablet Take 1,000 mg by mouth daily.    . butalbital-acetaminophen-caffeine (FIORICET/CODEINE) 50-325-40-30 MG capsule Take 1 capsule by mouth every 6 (six) hours as needed for migraine. 3 capsule 0  . Ferrous Sulfate (IRON) 28 MG TABS Take 2 tablets by mouth daily.    . fluticasone (FLONASE) 50 MCG/ACT nasal spray Place 2 sprays into both nostrils daily. 16 g 1  . gabapentin (NEURONTIN) 300 MG capsule Take 600 mg by mouth at bedtime.  0  . lamoTRIgine (LAMICTAL) 150 MG tablet Take 1 tablet by mouth daily.  0  . levocetirizine (XYZAL) 5 MG  tablet Take 1 tablet (5 mg total) by mouth every evening. 30 tablet 3  . levothyroxine (SYNTHROID, LEVOTHROID) 125 MCG tablet Take 1 tablet (125 mcg total) by mouth daily. 30 tablet 1  . Multiple Vitamins-Minerals (MULTIVITAMIN WITH MINERALS) tablet Take 1 tablet by mouth  daily.    . nitroGLYCERIN (NITRODUR - DOSED IN MG/24 HR) 0.2 mg/hr patch Apply 1/4th patch to affected ankle, change daily 30 patch 1  . omeprazole (PRILOSEC) 20 MG capsule Take 2 capsules (40 mg total) by mouth daily. 60 capsule 5  . ondansetron (ZOFRAN ODT) 4 MG disintegrating tablet Take 1 tablet (4 mg total) by mouth every 8 (eight) hours as needed for nausea or vomiting. (Patient not taking: Reported on 04/16/2017) 20 tablet 0  . sertraline (ZOLOFT) 50 MG tablet Take 2 tablets by mouth daily.  0  . SUMAtriptan (IMITREX) 50 MG tablet Take 1 tablet (50 mg total) by mouth every 2 (two) hours as needed for migraine. May repeat in 2 hours if headache persists or recurs. (Patient not taking: Reported on 04/16/2017) 10 tablet 0  . traZODone (DESYREL) 100 MG tablet Take 100 mg by mouth at bedtime as needed.  0   No current facility-administered medications on file prior to visit.     ALLERGIES: Allergies  Allergen Reactions  . Augmentin [Amoxicillin-Pot Clavulanate]     hives    FAMILY HISTORY: Family History  Problem Relation Age of Onset  . Cancer Paternal Aunt        colon  . Cancer Paternal Uncle        "small cell cancer"  . Diabetes Maternal Grandfather   . Arthritis Maternal Grandfather   . Arthritis Maternal Grandmother   . Breast cancer Maternal Grandmother   . Arthritis Paternal Grandmother   . Arthritis Paternal Grandfather     SOCIAL HISTORY: Social History   Socioeconomic History  . Marital status: Single    Spouse name: Not on file  . Number of children: Not on file  . Years of education: 5112  . Highest education level: Not on file  Social Needs  . Financial resource strain: Not on file  . Food insecurity - worry: Not on file  . Food insecurity - inability: Not on file  . Transportation needs - medical: Not on file  . Transportation needs - non-medical: Not on file  Occupational History  . Not on file  Tobacco Use  . Smoking status: Never Smoker  .  Smokeless tobacco: Never Used  Substance and Sexual Activity  . Alcohol use: No    Alcohol/week: 0.0 oz  . Drug use: No  . Sexual activity: Not on file  Other Topics Concern  . Not on file  Social History Narrative   Unemployed   Lives with Dad   Seeking work   Only child   Dog 3   Lives in a one story house with father, has a cat-12/26/16-sjb    REVIEW OF SYSTEMS: Constitutional: No fevers, chills, or sweats, no generalized fatigue, change in appetite Eyes: No visual changes, double vision, eye pain Ear, nose and throat: No hearing loss, ear pain, nasal congestion, sore throat Cardiovascular: No chest pain, palpitations Respiratory:  No shortness of breath at rest or with exertion, wheezes GastrointestinaI: No nausea, vomiting, diarrhea, abdominal pain, fecal incontinence Genitourinary:  No dysuria, urinary retention or frequency Musculoskeletal:  No neck pain, back pain Integumentary: No rash, pruritus, skin lesions Neurological: as above Psychiatric: No depression, insomnia, anxiety Endocrine: No palpitations,  fatigue, diaphoresis, mood swings, change in appetite, change in weight, increased thirst Hematologic/Lymphatic:  No purpura, petechiae. Allergic/Immunologic: no itchy/runny eyes, nasal congestion, recent allergic reactions, rashes  PHYSICAL EXAM: Vitals:   04/16/17 1407  BP: 110/82  Pulse: 87  SpO2: 98%   General: No acute distress.  Patient appears well-groomed.  Morbidly obese body habitus. Head:  Normocephalic/atraumatic Eyes:  Fundi examined but not visualized Neck: supple, no paraspinal tenderness, full range of motion Heart:  Regular rate and rhythm Lungs:  Clear to auscultation bilaterally Back: No paraspinal tenderness Neurological Exam: alert and oriented to person, place, and time. Attention span and concentration intact, recent and remote memory intact, fund of knowledge intact.  Speech fluent and not dysarthric, language intact.  CN II-XII intact.  Bulk and tone normal, muscle strength 5/5 throughout.  Sensation to light touch  intact.  Deep tendon reflexes 2+ throughout.  Finger to nose testing intact.  Gait normal, Romberg negatve  IMPRESSION: Migraines, improved  PLAN: 1.  Refill topiramate 50mg  at bedtime.  Discussed teratogenic effects and not to get pregnant while taking. 2.  Excedrin as needed, limited to no more than 2 days out of week 3.  Exercise, weight loss, hydration, eliminate soda  4.  Follow up in 6 months.  Shon Millet, DO  CC: [name], NP

## 2017-04-19 ENCOUNTER — Ambulatory Visit (INDEPENDENT_AMBULATORY_CARE_PROVIDER_SITE_OTHER): Payer: BLUE CROSS/BLUE SHIELD | Admitting: Family

## 2017-04-19 ENCOUNTER — Encounter: Payer: Self-pay | Admitting: Family

## 2017-04-19 VITALS — BP 132/68 | HR 85 | Temp 98.4°F | Resp 16 | Ht 67.0 in | Wt 342.0 lb

## 2017-04-19 DIAGNOSIS — Z23 Encounter for immunization: Secondary | ICD-10-CM | POA: Diagnosis not present

## 2017-04-19 DIAGNOSIS — Z0000 Encounter for general adult medical examination without abnormal findings: Secondary | ICD-10-CM | POA: Diagnosis not present

## 2017-04-19 NOTE — Progress Notes (Signed)
Subjective:    Patient ID: Margaret Golden, female    DOB: 01/27/1991, 27 y.o.   MRN: [id number]  HPI  Patient presents today for complete physical.  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  Pap: 05/03/14- sees GYN, will schedule Pap.  Dental: up to date Vision: up to date   Review of Systems  Constitutional: Negative for unexpected weight change.  HENT: Positive for rhinorrhea. Negative for hearing loss.   Eyes: Negative for visual disturbance.  Respiratory: Negative for cough.   Cardiovascular: Negative for leg swelling.  Gastrointestinal: Negative for blood in stool, constipation and diarrhea.  Genitourinary: Negative for dysuria, frequency and hematuria.  Musculoskeletal: Negative for arthralgias and myalgias.  Skin: Negative for rash.  Neurological: Negative for headaches.  Hematological: Negative for adenopathy.  Psychiatric/Behavioral:       Reports mood is stable.     Past Medical History:  Diagnosis Date  . Depression   . History of chicken pox   . History of kidney stones   . Thyroid disease    hypothyroid     Social History   Socioeconomic History  . Marital status: Single    Spouse name: Not on file  . Number of children: Not on file  . Years of education: 8112  . Highest education level: Not on file  Social Needs  . Financial resource strain: Not on file  . Food insecurity - worry: Not on file  . Food insecurity - inability: Not on file  . Transportation needs - medical: Not on file  . Transportation needs - non-medical: Not on file  Occupational History  . Not on file  Tobacco Use  . Smoking status: Never Smoker  . Smokeless tobacco: Never Used  Substance and Sexual Activity  . Alcohol use: No   Alcohol/week: 0.0 oz  . Drug use: No  . Sexual activity: No  Other Topics Concern  . Not on file  Social History Narrative   Unemployed   Lives with Dad   Seeking work   Only child   Dog 3   Lives in a one story house with father, has a cat-12/26/16-sjb    Past Surgical History:  Procedure Laterality Date  . KNEE ARTHROSCOPY Right 2007  . TONSILLECTOMY  2010    Family History  Problem Relation Age of Onset  . Cancer Paternal Aunt        colon  . Cancer Paternal Uncle        "small cell cancer"  . Diabetes Maternal Grandfather   . Arthritis Maternal Grandfather   . Arthritis Maternal Grandmother   . Breast cancer Maternal Grandmother   . Arthritis Paternal Grandmother   . Arthritis Paternal Grandfather     Allergies  Allergen Reactions  . Augmentin [Amoxicillin-Pot Clavulanate]     hives    Current Outpatient Medications on File Prior to Visit  Medication Sig Dispense Refill  . albuterol (PROVENTIL HFA;VENTOLIN HFA) 108 (90 Base) MCG/ACT inhaler Inhale 2 puffs into the lungs every 6 (six) hours as needed for wheezing or shortness of breath. 1 Inhaler 0  . Ascorbic Acid (VITAMIN C) 1000 MG tablet Take 1,000 mg by mouth daily.    . butalbital-acetaminophen-caffeine (FIORICET/CODEINE)  50-325-40-30 MG capsule Take 1 capsule by mouth every 6 (six) hours as needed for migraine. 3 capsule 0  . Ferrous Sulfate (IRON) 28 MG TABS Take 2 tablets by mouth daily.    . fluticasone (FLONASE) 50 MCG/ACT nasal spray Place 2 sprays into both nostrils daily. 16 g 1  . gabapentin (NEURONTIN) 300 MG capsule Take 600 mg by mouth at bedtime.  0  . Gabapentin Enacarbil 600 MG TBCR Take by mouth.    . lamoTRIgine (LAMICTAL) 150 MG tablet Take 1 tablet by mouth daily.  0  . levocetirizine (XYZAL) 5 MG tablet Take 1 tablet (5 mg total) by mouth every evening. 30 tablet 3  . levothyroxine (SYNTHROID, LEVOTHROID) 125 MCG tablet Take 1 tablet (125 mcg total) by mouth daily. 30 tablet 1  .  Multiple Vitamins-Minerals (MULTIVITAMIN WITH MINERALS) tablet Take 1 tablet by mouth daily.    . nitroGLYCERIN (NITRODUR - DOSED IN MG/24 HR) 0.2 mg/hr patch Apply 1/4th patch to affected ankle, change daily 30 patch 1  . omeprazole (PRILOSEC) 20 MG capsule Take 2 capsules (40 mg total) by mouth daily. 60 capsule 5  . ondansetron (ZOFRAN ODT) 4 MG disintegrating tablet Take 1 tablet (4 mg total) by mouth every 8 (eight) hours as needed for nausea or vomiting. 20 tablet 0  . sertraline (ZOLOFT) 50 MG tablet Take 2 tablets by mouth daily.  0  . SUMAtriptan (IMITREX) 50 MG tablet Take 1 tablet (50 mg total) by mouth every 2 (two) hours as needed for migraine. May repeat in 2 hours if headache persists or recurs. 10 tablet 0  . topiramate (TOPAMAX) 50 MG tablet Take 1 tablet (50 mg total) by mouth at bedtime. 30 tablet 5  . traZODone (DESYREL) 100 MG tablet Take 100 mg by mouth at bedtime as needed.  0   No current facility-administered medications on file prior to visit.     BP 132/68 (BP Location: Right Arm, Patient Position: Sitting, Cuff Size: Large)   Pulse 85   Temp 98.4 F (36.9 C) (Oral)   Resp 16   Ht 5\' 7"  (1.702 m)   Wt (!) 342 lb (155.1 kg)   LMP 04/10/2017   SpO2 99%   BMI 53.56 kg/m       Objective:   Physical Exam  Physical Exam  Constitutional: Morbidly obese white female. She is oriented to person, place, and time. She appears well-developed and well-nourished. No distress.  HENT:  Head: Normocephalic and atraumatic.  Right Ear: Tympanic membrane and ear canal normal.  Left Ear: Tympanic membrane and ear canal normal.  Mouth/Throat: Oropharynx is clear and moist.  Eyes: Pupils are equal, round, and reactive to light. No scleral icterus.  Neck: Normal range of motion. No thyromegaly present.  Cardiovascular: Normal rate and regular rhythm.   No murmur heard. Pulmonary/Chest: Effort normal and breath sounds normal. No respiratory distress. He has no wheezes. She  has no rales. She exhibits no tenderness.  Abdominal: Soft. Bowel sounds are normal. She exhibits no distension and no mass. There is no tenderness. There is no rebound and no guarding.  Musculoskeletal: She exhibits no edema.  Lymphadenopathy:    She has no cervical adenopathy.  Neurological: She is alert and oriented to person, place, and time. She has normal patellar reflexes. She exhibits normal muscle tone. Coordination normal.  Skin: Skin is warm and dry.  Psychiatric: She has a normal mood and affect. Her behavior is normal. Judgment and thought content normal.  Breasts: Examined lying Right: Without masses, retractions, discharge or axillary adenopathy.  Left: Without masses, retractions, discharge or axillary adenopathy.  Pelvic: deferred        Assessment & Plan:    Preventative Care- discussed healthy diet, exercise, weight loss. Obtain routine lab work.  Immunizations reviewed and up to date.      Assessment & Plan:

## 2017-04-19 NOTE — Addendum Note (Signed)
Addended by: [name]'SULLIVAN, Shevelle Smither on: 04/19/2017 10:48 AM   Modules accepted: Orders

## 2017-04-19 NOTE — Patient Instructions (Signed)
Please schedule follow up with your GYN for pap smear. Continue to work on [employer] Grouphealthy diet, exercise, weight loss.

## 2017-04-19 NOTE — Addendum Note (Signed)
Addended by: Verdie ShireBAYNES, ANGELA M on: 04/19/2017 11:27 AM   Modules accepted: Orders

## 2017-04-22 ENCOUNTER — Other Ambulatory Visit (INDEPENDENT_AMBULATORY_CARE_PROVIDER_SITE_OTHER): Payer: BLUE CROSS/BLUE SHIELD

## 2017-04-22 DIAGNOSIS — Z0000 Encounter for general adult medical examination without abnormal findings: Secondary | ICD-10-CM | POA: Diagnosis not present

## 2017-04-22 DIAGNOSIS — F4322 Adjustment disorder with anxiety: Secondary | ICD-10-CM | POA: Diagnosis not present

## 2017-04-22 LAB — CBC WITH DIFFERENTIAL/PLATELET
BASOS ABS: 0 10*3/uL (ref 0.0–0.1)
BASOS PCT: 0.5 % (ref 0.0–3.0)
EOS ABS: 0.2 10*3/uL (ref 0.0–0.7)
Eosinophils Relative: 1.7 % (ref 0.0–5.0)
HCT: 43.1 % (ref 36.0–46.0)
Hemoglobin: 14.4 g/dL (ref 12.0–15.0)
Lymphocytes Relative: 29.7 % (ref 12.0–46.0)
Lymphs Abs: 3 10*3/uL (ref 0.7–4.0)
MCHC: 33.4 g/dL (ref 30.0–36.0)
MCV: 82.6 fl (ref 78.0–100.0)
MONO ABS: 0.5 10*3/uL (ref 0.1–1.0)
Monocytes Relative: 4.7 % (ref 3.0–12.0)
NEUTROS ABS: 6.4 10*3/uL (ref 1.4–7.7)
Neutrophils Relative %: 63.4 % (ref 43.0–77.0)
PLATELETS: 223 10*3/uL (ref 150.0–400.0)
RBC: 5.22 Mil/uL — ABNORMAL HIGH (ref 3.87–5.11)
RDW: 14.2 % (ref 11.5–15.5)
WBC: 10.1 10*3/uL (ref 4.0–10.5)

## 2017-04-22 LAB — BASIC METABOLIC PANEL
BUN: 8 mg/dL (ref 6–23)
CHLORIDE: 100 meq/L (ref 96–112)
CO2: 29 meq/L (ref 19–32)
CREATININE: 0.68 mg/dL (ref 0.40–1.20)
Calcium: 9.4 mg/dL (ref 8.4–10.5)
GFR: 110.22 mL/min (ref >60.00)
Glucose, Bld: 91 mg/dL (ref 70–99)
POTASSIUM: 3.7 meq/L (ref 3.5–5.1)
Sodium: 139 mEq/L (ref 135–145)

## 2017-04-22 LAB — LIPID PANEL
CHOL/HDL RATIO: 5
CHOLESTEROL: 204 mg/dL — AB (ref 0–200)
HDL: 42.3 mg/dL (ref >39.00)
LDL CALC: 125 mg/dL — AB (ref 0–99)
NonHDL: 161.91
TRIGLYCERIDES: 185 mg/dL — AB (ref 0.0–149.0)
VLDL: 37 mg/dL (ref 0.0–40.0)

## 2017-04-22 LAB — HEPATIC FUNCTION PANEL
ALK PHOS: 87 U/L (ref 39–117)
ALT: 29 U/L (ref 0–35)
AST: 44 U/L — AB (ref 0–37)
Albumin: 4.5 g/dL (ref 3.5–5.2)
BILIRUBIN DIRECT: 0.1 mg/dL (ref 0.0–0.3)
TOTAL PROTEIN: 7.9 g/dL (ref 6.0–8.3)
Total Bilirubin: 0.6 mg/dL (ref 0.2–1.2)

## 2017-04-22 LAB — URINALYSIS, ROUTINE W REFLEX MICROSCOPIC
BILIRUBIN URINE: NEGATIVE
Hgb urine dipstick: NEGATIVE
KETONES UR: NEGATIVE
LEUKOCYTES UA: NEGATIVE
Nitrite: NEGATIVE
PH: 5.5 (ref 5.0–8.0)
RBC / HPF: NONE SEEN (ref 0–?)
SPECIFIC GRAVITY, URINE: 1.025 (ref 1.000–1.030)
TOTAL PROTEIN, URINE-UPE24: NEGATIVE
UROBILINOGEN UA: 0.2 (ref 0.0–1.0)
Urine Glucose: NEGATIVE

## 2017-04-22 LAB — TSH: TSH: 8.3 u[IU]/mL — AB (ref 0.35–4.50)

## 2017-04-24 ENCOUNTER — Other Ambulatory Visit: Payer: Self-pay | Admitting: Family

## 2017-04-24 ENCOUNTER — Other Ambulatory Visit (INDEPENDENT_AMBULATORY_CARE_PROVIDER_SITE_OTHER): Payer: BLUE CROSS/BLUE SHIELD

## 2017-04-24 DIAGNOSIS — E039 Hypothyroidism, unspecified: Secondary | ICD-10-CM

## 2017-04-24 DIAGNOSIS — I519 Heart disease, unspecified: Secondary | ICD-10-CM

## 2017-04-24 LAB — T4, FREE: FREE T4: 0.88 ng/dL (ref 0.60–1.60)

## 2017-04-24 LAB — T3, FREE: T3 FREE: 4 pg/mL (ref 2.3–4.2)

## 2017-04-24 NOTE — Telephone Encounter (Signed)
Lab work shows synthroid needs to be increased. Please confirm that she is taking as directed and if so, let's increase to 150 mcg once daily and repeat tsh in 4-6 weeks.

## 2017-04-26 NOTE — Telephone Encounter (Signed)
Left message for pt to check mychart. Message sent; awaiting reply.

## 2017-04-29 ENCOUNTER — Ambulatory Visit: Payer: BLUE CROSS/BLUE SHIELD | Admitting: Physical Therapy

## 2017-04-29 ENCOUNTER — Encounter: Payer: Self-pay | Admitting: Physical Therapy

## 2017-04-29 DIAGNOSIS — R2689 Other abnormalities of gait and mobility: Secondary | ICD-10-CM | POA: Diagnosis not present

## 2017-04-29 DIAGNOSIS — M25572 Pain in left ankle and joints of left foot: Secondary | ICD-10-CM

## 2017-04-30 DIAGNOSIS — F4322 Adjustment disorder with anxiety: Secondary | ICD-10-CM | POA: Diagnosis not present

## 2017-04-30 NOTE — Therapy (Signed)
[facility]  [facility] [address] [place], [place], [address] Phone: [phone]   Fax:  [phone]  Physical Therapy Evaluation  Patient Details  Name: Margaret Golden MRN: [id number] Date of Birth: Oct 15, 1990 Referring Provider: Norton Blizzard   Encounter Date: 04/29/2017  PT End of Session - 04/29/17 1603    Visit Number  1    Number of Visits  12    Date for PT Re-Evaluation  06/10/17    PT Start Time  0325    PT Stop Time  0400    PT Time Calculation (min)  35 min    Activity Tolerance  Patient tolerated treatment well    Behavior During Therapy  [facility] for tasks assessed/performed       Past Medical History:  Diagnosis Date  . Depression   . History of chicken pox   . History of kidney stones   . Thyroid disease    hypothyroid    Past Surgical History:  Procedure Laterality Date  . KNEE ARTHROSCOPY Right 2007  . TONSILLECTOMY  2010    There were no vitals filed for this visit.   Subjective Assessment - 04/29/17 1530    Subjective  Pt had initial injury when she was 27 y/o. Reports having to wear brace and/or boot on and off for years. She states increased pain this winter, no new injury. She has not had treatment in some time for this. She is wearing fig 8 brace and hiking boot. Pt is starting new job this week, at [employer].     Currently in Pain?  Yes    Pain Score  3     Pain Location  Ankle    Pain Orientation  Left    Pain Descriptors / Indicators  Aching;Dull    Pain Type  Chronic pain    Pain Onset  More than a month ago    Pain Frequency  Intermittent    Aggravating Factors   Standing, walking, being on feet.          [facility] PT Assessment - 04/30/17 0001      Assessment   Medical Diagnosis  L ankle pain    Referring Provider  Norton Blizzard    Next MD Visit  05/13/17      Precautions   Precautions  None      Restrictions   Weight Bearing Restrictions  No      Balance Screen   Has the patient fallen  in the past 6 months  No      Prior Function   Level of Independence  Independent      Cognition   Overall Cognitive Status  Within Functional Limits for tasks assessed      ROM / Strength   AROM / PROM / Strength  AROM;Strength      AROM   Overall AROM Comments  L ankle: [facility]      Strength   Overall Strength Comments  L ankle: 4/5, LEs: 4+/5      Palpation   Palpation comment  Mid edema at lower leg on L; Mild pain with palpation of lateral ankle and peroneal tendon; No pain with reisted EV;   Foot Posture: Near neutral alignement, moderate fat pad breakdown;   Standing static balance: good;  Dynamic: fair ; SLS on L: 7 sec , moderate sway      Ambulation/Gait   Gait Comments  Moderate to significant hesitation for initial contact, decreased heel strike, decreased  equality of step length, decreased push off,       Balance   Balance Assessed  Yes             Objective measurements completed on examination: See above findings.      [facility] Adult PT Treatment/Exercise - 04/30/17 0001      Exercises   Exercises  Ankle;Knee/Hip      Ankle Exercises: Stretches   Gastroc Stretch  3 reps;30 seconds    Gastroc Stretch Limitations  at wall      Ankle Exercises: Standing   SLS  10 sec Bilaterally      Ankle Exercises: Seated   Heel Raises  20 reps      Ankle Exercises: Supine   T-Band  4 way ankle x20 each, RTB             PT Education - 04/30/17 0815    Education provided  Yes    Education Details  HEP, PT expectations and POC    Person(s) Educated  Patient    Methods  Explanation;Handout    Comprehension  Verbalized understanding;Verbal cues required       PT Short Term Goals - 04/29/17 1538      PT SHORT TERM GOAL #1   Title  Pt to be independent with HEP    Period  Weeks    Status  New    Target Date  05/13/17        PT Long Term Goals - 04/29/17 1538      PT LONG TERM GOAL #1   Title  Pt to report decreased pain in L ankle, to 0-2/10 with  standing/walking    Period  Weeks    Status  New    Target Date  06/10/17      PT LONG TERM GOAL #2   Title  Pt to demo improved dynamic stability of L ankle, to be WNL , to improve ability for community navigation    Time  6    Period  Weeks    Status  New    Target Date  06/10/17      PT LONG TERM GOAL #3   Title  Pt to demo improved ambulation mechanics, to be WNL for pt age, with improved speed, heel strike, and push off, with distances up to 1 mile     Time  6    Period  Weeks    Status  New    Target Date  06/10/17             Plan - 04/30/17 0815    Clinical Impression Statement  Pt presents with primary complaint of increased pain in L ankle, that has become chronic in nature. Pt continues to have dificulty and wear brace from injury that happened several years ago. Pt with ROM near WNL. She does have strength loss in L ankle, and has poor stability for dynamic activities. She has poor and inefficient gait pattern, with hesitation for initial contact, decreased heel strike and push off. She has slow speed, and is very cautious with gait. She will benefit from balance and stability training, as well as gait and stair training, and HEP. Pt to benefit from PT to improve deficits, and return to functional activity level appropriate for her age.     Clinical Presentation  Stable    Clinical Decision Making  Low    Rehab Potential  Good    PT Frequency  2x / week  PT Duration  6 weeks    PT Treatment/Interventions  ADLs/Self Care Home Management;Cryotherapy;Electrical Stimulation;Iontophoresis 4mg /ml Dexamethasone;Moist Heat;Therapeutic activities;Therapeutic exercise;Functional mobility training;Stair training;Gait training;DME Instruction;Ultrasound;Balance training;Neuromuscular re-education;Patient/family education;Orthotic Fit/Training;Manual techniques;Taping;Dry needling;Passive range of motion    PT Next Visit Plan  Progress strength and stability     Consulted and  Agree with Plan of Care  Patient       Patient will benefit from skilled therapeutic intervention in order to improve the following deficits and impairments:  Abnormal gait, Decreased endurance, Obesity, Increased edema, Decreased activity tolerance, Decreased strength, Pain, Difficulty walking, Decreased mobility, Decreased balance, Decreased range of motion, Impaired flexibility  Visit Diagnosis: Pain in left ankle and joints of left foot - Plan: PT plan of care cert/re-cert  Other abnormalities of gait and mobility - Plan: PT plan of care cert/re-cert     Problem List Patient Active Problem List   Diagnosis Date Noted  . Left ankle pain 02/28/2017  . Morbid obesity (HCC) 05/11/2016  . Fatty liver disease, nonalcoholic 02/02/2015  . Depression 01/03/2015  . Hypothyroidism 01/03/2015  . GERD (gastroesophageal reflux disease) 01/03/2015  . Migraines 01/03/2015  . Essential hypertension 06/02/2014   Sedalia MutaLauren Sumayah Bearse, PT, DPT 8:37 AM  04/30/17    [facility] [address] [place], [place], [address] Phone: [phone]   Fax:  [phone]  Name: Margaret Golden MRN: [id number] Date of Birth: 04/08/1990

## 2017-05-01 ENCOUNTER — Ambulatory Visit: Payer: BLUE CROSS/BLUE SHIELD | Admitting: Physical Therapy

## 2017-05-01 DIAGNOSIS — R2689 Other abnormalities of gait and mobility: Secondary | ICD-10-CM

## 2017-05-01 DIAGNOSIS — M25572 Pain in left ankle and joints of left foot: Secondary | ICD-10-CM

## 2017-05-02 ENCOUNTER — Encounter: Payer: Self-pay | Admitting: Physical Therapy

## 2017-05-02 NOTE — Therapy (Signed)
[facility] [address] [place], [place], [address] Phone: [phone]   Fax:  [phone]  Physical Therapy Treatment  Patient Details  Name: Monna Famaylor A Gato MRN: [id number] Date of Birth: 09/18/1990 Referring Provider: Norton BlizzardShane Hudnall   Encounter Date: 05/01/2017  PT End of Session - 05/02/17 1431    Visit Number  2    Number of Visits  12    Date for PT Re-Evaluation  06/10/17    PT Start Time  1515    PT Stop Time  1555    PT Time Calculation (min)  40 min    Activity Tolerance  Patient tolerated treatment well    Behavior During Therapy  [facility] for tasks assessed/performed       Past Medical History:  Diagnosis Date  . Depression   . History of chicken pox   . History of kidney stones   . Thyroid disease    hypothyroid    Past Surgical History:  Procedure Laterality Date  . KNEE ARTHROSCOPY Right 2007  . TONSILLECTOMY  2010    There were no vitals filed for this visit.  Subjective Assessment - 05/02/17 1416    Subjective  Pt states mild soreness in ankle. She is wearing sneakers today, Dyad, with inserts. She states doing HEP.     Currently in Pain?  Yes    Pain Score  3     Pain Location  Ankle    Pain Orientation  Left    Pain Descriptors / Indicators  Aching;Dull    Pain Type  Chronic pain    Pain Onset  More than a month ago    Pain Frequency  Intermittent                      [facility] Adult PT Treatment/Exercise - 05/01/17 1524      Ambulation/Gait   Gait Comments  35 ft x10, practice for heel to toe and shorter stride length.      Exercises   Exercises  Ankle;Knee/Hip      Knee/Hip Exercises: Aerobic   Stationary Bike  L1 x8 min      Knee/Hip Exercises: Standing   Hip Abduction  2 sets;10 reps;Both      Ankle Exercises: Stretches   Gastroc Stretch  3 reps;30 seconds    Gastroc Stretch Limitations  at wall      Ankle Exercises: Standing   SLS  --    Other Standing Ankle Exercises   Pre-Gait, a/p weight shift, feet staggered, x20 Bil      Ankle Exercises: Seated   Heel Raises  20 reps      Ankle Exercises: Supine   T-Band  4 way ankle x20 each, RTB             PT Education - 05/02/17 1431    Education provided  Yes    Education Scientist, research (life sciences)Details  [employer] for [employer]    Person(s) Educated  Patient    Methods  Explanation    Comprehension  Verbalized understanding;Verbal cues required       PT Short Term Goals - 04/29/17 1538      PT SHORT TERM GOAL #1   Title  Pt to be independent with HEP    Period  Weeks    Status  New    Target Date  05/13/17        PT Long Term Goals - 04/29/17 1538      PT  LONG TERM GOAL #1   Title  Pt to report decreased pain in L ankle, to 0-2/10 with standing/walking    Period  Weeks    Status  New    Target Date  06/10/17      PT LONG TERM GOAL #2   Title  Pt to demo improved dynamic stability of L ankle, to be WNL , to improve ability for community navigation    Time  6    Period  Weeks    Status  New    Target Date  06/10/17      PT LONG TERM GOAL #3   Title  Pt to demo improved ambulation mechanics, to be WNL for pt age, with improved speed, heel strike, and push off, with distances up to 1 mile     Time  6    Period  Weeks    Status  New    Target Date  06/10/17            Plan - 05/02/17 1433    Clinical Impression Statement  Pt with mild soreness with activities today. Gait mechanics practiced today, for heel to toe walk, and decreasing stride length. Pt with inconsistent speed and step length, due to long standing pain, and has lack of heel strike and push off. Plan to continue gait training and stability.    Rehab Potential  Good    PT Frequency  2x / week    PT Duration  6 weeks    PT Treatment/Interventions  ADLs/Self Care Home Management;Cryotherapy;Electrical Stimulation;Iontophoresis 4mg /ml Dexamethasone;Moist Heat;Therapeutic activities;Therapeutic exercise;Functional mobility training;Stair  training;Gait training;DME Instruction;Ultrasound;Balance training;Neuromuscular re-education;Patient/family education;Orthotic Fit/Training;Manual techniques;Taping;Dry needling;Passive range of motion    PT Next Visit Plan  Progress strength and stability     Consulted and Agree with Plan of Care  Patient       Patient will benefit from skilled therapeutic intervention in order to improve the following deficits and impairments:  Abnormal gait, Decreased endurance, Obesity, Increased edema, Decreased activity tolerance, Decreased strength, Pain, Difficulty walking, Decreased mobility, Decreased balance, Decreased range of motion, Impaired flexibility  Visit Diagnosis: Pain in left ankle and joints of left foot  Other abnormalities of gait and mobility     Problem List Patient Active Problem List   Diagnosis Date Noted  . Left ankle pain 02/28/2017  . Morbid obesity (HCC) 05/11/2016  . Fatty liver disease, nonalcoholic 02/02/2015  . Depression 01/03/2015  . Hypothyroidism 01/03/2015  . GERD (gastroesophageal reflux disease) 01/03/2015  . Migraines 01/03/2015  . Essential hypertension 06/02/2014   Sedalia Muta, PT, DPT 2:39 PM  05/02/17    [facility] [address] [place], [place], [address] Phone: [phone]   Fax:  [phone]  Name: TENIYAH SEIVERT MRN: [id number] Date of Birth: Nov 19, 1990

## 2017-05-06 ENCOUNTER — Ambulatory Visit (INDEPENDENT_AMBULATORY_CARE_PROVIDER_SITE_OTHER): Payer: BLUE CROSS/BLUE SHIELD | Admitting: Physical Therapy

## 2017-05-06 ENCOUNTER — Encounter: Payer: Self-pay | Admitting: Physical Therapy

## 2017-05-06 DIAGNOSIS — R2689 Other abnormalities of gait and mobility: Secondary | ICD-10-CM

## 2017-05-06 DIAGNOSIS — M25572 Pain in left ankle and joints of left foot: Secondary | ICD-10-CM

## 2017-05-06 DIAGNOSIS — F4322 Adjustment disorder with anxiety: Secondary | ICD-10-CM | POA: Diagnosis not present

## 2017-05-06 NOTE — Therapy (Signed)
[facility] [address] [place], [place], [address] Phone: [phone]   Fax:  [phone]  Physical Therapy Treatment  Patient Details  Name: [name] MRN: [id number] Date of Birth: 1991-02-04 Referring Provider: Norton Blizzard   Encounter Date: 05/06/2017  PT End of Session - 05/06/17 2055    Visit Number  3    Number of Visits  12    Date for PT Re-Evaluation  06/10/17    PT Start Time  1605    PT Stop Time  1655    PT Time Calculation (min)  50 min    Activity Tolerance  Patient tolerated treatment well    Behavior During Therapy  [facility] for tasks assessed/performed       Past Medical History:  Diagnosis Date  . Depression   . History of chicken pox   . History of kidney stones   . Thyroid disease    hypothyroid    Past Surgical History:  Procedure Laterality Date  . KNEE ARTHROSCOPY Right 2007  . TONSILLECTOMY  2010    There were no vitals filed for this visit.  Subjective Assessment - 05/06/17 2053    Subjective  Pt states increased pain in last few days, she has started a new job and PT in the same week. She has been increasing standing and walking time significantly with current job. She also reports some soreness on bottom of bil feet.     Currently in Pain?  Yes    Pain Score  4     Pain Location  Ankle    Pain Orientation  Left    Pain Descriptors / Indicators  Aching;Dull    Pain Type  Chronic pain    Pain Onset  More than a month ago    Pain Frequency  Intermittent                      [facility] Adult PT Treatment/Exercise - 05/06/17 1611      Ambulation/Gait   Gait Comments  35 ft x8, practice for heel to toe and shorter stride length.      Exercises   Exercises  Ankle;Knee/Hip      Knee/Hip Exercises: Aerobic   Stationary Bike  L1 x8 min      Knee/Hip Exercises: Standing   Hip Abduction  2 sets;10 reps;Both      Modalities   Modalities  Ultrasound      Ultrasound    Ultrasound Location  L lateral ankle    Ultrasound Parameters  50% 1.2 w/cm2    Ultrasound Goals  Pain      Manual Therapy   Manual Therapy  Passive ROM;Joint mobilization    Joint Mobilization  Grade 3 to increase DF     Passive ROM  PROM L ankle      Ankle Exercises: Stretches   Gastroc Stretch  3 reps;30 seconds    Gastroc Stretch Limitations  at wall    Other Stretch  Self Plantar fascia stretch 1 min Bil      Ankle Exercises: Standing   Other Standing Ankle Exercises  --      Ankle Exercises: Seated   Heel Raises  20 reps      Ankle Exercises: Supine   T-Band  4 way ankle x20 each, RTB             PT Education - 05/06/17 2055    Education provided  Yes  Education Details  Pain relief, icing, possibly decreasing use of orthotics.     Person(s) Educated  Patient    Methods  Explanation    Comprehension  Verbalized understanding       PT Short Term Goals - 04/29/17 1538      PT SHORT TERM GOAL #1   Title  Pt to be independent with HEP    Period  Weeks    Status  New    Target Date  05/13/17        PT Long Term Goals - 04/29/17 1538      PT LONG TERM GOAL #1   Title  Pt to report decreased pain in L ankle, to 0-2/10 with standing/walking    Period  Weeks    Status  New    Target Date  06/10/17      PT LONG TERM GOAL #2   Title  Pt to demo improved dynamic stability of L ankle, to be WNL , to improve ability for community navigation    Time  6    Period  Weeks    Status  New    Target Date  06/10/17      PT LONG TERM GOAL #3   Title  Pt to demo improved ambulation mechanics, to be WNL for pt age, with improved speed, heel strike, and push off, with distances up to 1 mile     Time  6    Period  Weeks    Status  New    Target Date  06/10/17            Plan - 05/06/17 2056    Clinical Impression Statement  Pt with soreness with ambulation today. She has improving mechanics, but sill looks antalgic. Pt cued for decreasing stride length.  She has mild soreness with activities today, due to being sore from increased activity at work as well. Plan to progress strength and balance as tolerated. Ultrasound done for pain and inflammation at lateral ankle. Discussed icing     Rehab Potential  Good    PT Frequency  2x / week    PT Duration  6 weeks    PT Treatment/Interventions  ADLs/Self Care Home Management;Cryotherapy;Electrical Stimulation;Iontophoresis 4mg /ml Dexamethasone;Moist Heat;Therapeutic activities;Therapeutic exercise;Functional mobility training;Stair training;Gait training;DME Instruction;Ultrasound;Balance training;Neuromuscular re-education;Patient/family education;Orthotic Fit/Training;Manual techniques;Taping;Dry needling;Passive range of motion    PT Next Visit Plan  Progress strength and stability     Consulted and Agree with Plan of Care  Patient       Patient will benefit from skilled therapeutic intervention in order to improve the following deficits and impairments:  Abnormal gait, Decreased endurance, Obesity, Increased edema, Decreased activity tolerance, Decreased strength, Pain, Difficulty walking, Decreased mobility, Decreased balance, Decreased range of motion, Impaired flexibility  Visit Diagnosis: Pain in left ankle and joints of left foot  Other abnormalities of gait and mobility     Problem List Patient Active Problem List   Diagnosis Date Noted  . Left ankle pain 02/28/2017  . Morbid obesity (HCC) 05/11/2016  . Fatty liver disease, nonalcoholic 02/02/2015  . Depression 01/03/2015  . Hypothyroidism 01/03/2015  . GERD (gastroesophageal reflux disease) 01/03/2015  . Migraines 01/03/2015  . Essential hypertension 06/02/2014   Sedalia MutaLauren Virgle Arth, PT, DPT 8:59 PM  05/06/17     [facility] [address] [place], [place], [address] Phone: [phone]   Fax:  [phone]  Name: Monna Famaylor A Janssens MRN: [id number] Date of Birth: 01/13/1991

## 2017-05-07 DIAGNOSIS — F4322 Adjustment disorder with anxiety: Secondary | ICD-10-CM | POA: Diagnosis not present

## 2017-05-08 ENCOUNTER — Ambulatory Visit: Payer: BLUE CROSS/BLUE SHIELD | Admitting: Physical Therapy

## 2017-05-08 ENCOUNTER — Encounter: Payer: Self-pay | Admitting: Physical Therapy

## 2017-05-08 DIAGNOSIS — R2689 Other abnormalities of gait and mobility: Secondary | ICD-10-CM | POA: Diagnosis not present

## 2017-05-08 DIAGNOSIS — M25572 Pain in left ankle and joints of left foot: Secondary | ICD-10-CM

## 2017-05-08 NOTE — Therapy (Signed)
[facility] [address] [place], [place], [address] Phone: [phone]   Fax:  [phone]  Physical Therapy Treatment  Patient Details  Name: LYNSEE WANDS MRN: [id number] Date of Birth: 12-06-1990 Referring Provider: Norton Blizzard   Encounter Date: 05/08/2017  PT End of Session - 05/08/17 1649    Visit Number  4    Number of Visits  12    Date for PT Re-Evaluation  06/10/17    PT Start Time  1520    PT Stop Time  1600    PT Time Calculation (min)  40 min    Activity Tolerance  Patient tolerated treatment well    Behavior During Therapy  [facility] for tasks assessed/performed       Past Medical History:  Diagnosis Date  . Depression   . History of chicken pox   . History of kidney stones   . Thyroid disease    hypothyroid    Past Surgical History:  Procedure Laterality Date  . KNEE ARTHROSCOPY Right 2007  . TONSILLECTOMY  2010    There were no vitals filed for this visit.  Subjective Assessment - 05/08/17 1648    Subjective  Pt states decreased pain in last couple days. She did take orthotics out of shoes.     Currently in Pain?  Yes    Pain Score  2     Pain Location  Ankle    Pain Orientation  Left    Pain Type  Chronic pain    Pain Onset  More than a month ago    Pain Frequency  Intermittent                      [facility] Adult PT Treatment/Exercise - 05/08/17 1533      Ambulation/Gait   Gait Comments  35 ft x8, practice for heel to toe and shorter stride length.      Exercises   Exercises  Ankle;Knee/Hip      Knee/Hip Exercises: Aerobic   Stationary Bike  L1 x8 min      Knee/Hip Exercises: Standing   Hip Flexion  20 reps    Hip Flexion Limitations  Marching    Hip Abduction  2 sets;10 reps;Both    Forward Step Up  10 reps    Forward Step Up Limitations  6 in Bil      Modalities   Modalities  --      Ultrasound   Ultrasound Goals  --      Manual Therapy   Manual Therapy  Passive  ROM;Joint mobilization    Joint Mobilization  Grade 3 to increase DF     Passive ROM  PROM L ankle      Ankle Exercises: Stretches   Gastroc Stretch  3 reps;30 seconds    Gastroc Stretch Limitations  at wall    Other Stretch  --      Ankle Exercises: Seated   Heel Raises  20 reps      Ankle Exercises: Supine   T-Band  INV/EV GTB x20       Ankle Exercises: Standing   Heel Raises  20 reps    Other Standing Ankle Exercises  Tandem stance 30 sec x3 bil;  Step ups onto AirEx x10 Bil;              PT Education - 05/08/17 1649    Education provided  Yes    Education Details  Balance  exercises, HEP    Person(s) Educated  Patient    Methods  Explanation    Comprehension  Verbalized understanding       PT Short Term Goals - 04/29/17 1538      PT SHORT TERM GOAL #1   Title  Pt to be independent with HEP    Period  Weeks    Status  New    Target Date  05/13/17        PT Long Term Goals - 04/29/17 1538      PT LONG TERM GOAL #1   Title  Pt to report decreased pain in L ankle, to 0-2/10 with standing/walking    Period  Weeks    Status  New    Target Date  06/10/17      PT LONG TERM GOAL #2   Title  Pt to demo improved dynamic stability of L ankle, to be WNL , to improve ability for community navigation    Time  6    Period  Weeks    Status  New    Target Date  06/10/17      PT LONG TERM GOAL #3   Title  Pt to demo improved ambulation mechanics, to be WNL for pt age, with improved speed, heel strike, and push off, with distances up to 1 mile     Time  6    Period  Weeks    Status  New    Target Date  06/10/17            Plan - 05/08/17 1649    Clinical Impression Statement  Pt showing improvements with ability for exercises. She is challenged with balance and step up activities today, and will benefit from continued progression of stability if pain continues to be decreasing.     Rehab Potential  Good    PT Frequency  2x / week    PT Duration  6 weeks     PT Treatment/Interventions  ADLs/Self Care Home Management;Cryotherapy;Electrical Stimulation;Iontophoresis 4mg /ml Dexamethasone;Moist Heat;Therapeutic activities;Therapeutic exercise;Functional mobility training;Stair training;Gait training;DME Instruction;Ultrasound;Balance training;Neuromuscular re-education;Patient/family education;Orthotic Fit/Training;Manual techniques;Taping;Dry needling;Passive range of motion    PT Next Visit Plan  Progress strength and stability     Consulted and Agree with Plan of Care  Patient       Patient will benefit from skilled therapeutic intervention in order to improve the following deficits and impairments:  Abnormal gait, Decreased endurance, Obesity, Increased edema, Decreased activity tolerance, Decreased strength, Pain, Difficulty walking, Decreased mobility, Decreased balance, Decreased range of motion, Impaired flexibility  Visit Diagnosis: Pain in left ankle and joints of left foot  Other abnormalities of gait and mobility     Problem List Patient Active Problem List   Diagnosis Date Noted  . Left ankle pain 02/28/2017  . Morbid obesity (HCC) 05/11/2016  . Fatty liver disease, nonalcoholic 02/02/2015  . Depression 01/03/2015  . Hypothyroidism 01/03/2015  . GERD (gastroesophageal reflux disease) 01/03/2015  . Migraines 01/03/2015  . Essential hypertension 06/02/2014    Sedalia MutaLauren Yoneko Talerico, PT, DPT 4:51 PM  05/08/17    [facility] [address] [place], [place], [address] Phone: [phone]   Fax:  [phone]  Name: Monna Famaylor A Sobotka MRN: [id number] Date of Birth: 07/27/1990

## 2017-05-13 ENCOUNTER — Encounter: Payer: Self-pay | Admitting: Family

## 2017-05-13 ENCOUNTER — Ambulatory Visit: Payer: BLUE CROSS/BLUE SHIELD | Admitting: Family

## 2017-05-13 VITALS — BP 116/82 | HR 89 | Temp 98.2°F | Resp 20 | Ht 67.0 in | Wt 351.2 lb

## 2017-05-13 DIAGNOSIS — E039 Hypothyroidism, unspecified: Secondary | ICD-10-CM

## 2017-05-13 DIAGNOSIS — R1011 Right upper quadrant pain: Secondary | ICD-10-CM | POA: Diagnosis not present

## 2017-05-13 DIAGNOSIS — M549 Dorsalgia, unspecified: Secondary | ICD-10-CM

## 2017-05-13 DIAGNOSIS — F4322 Adjustment disorder with anxiety: Secondary | ICD-10-CM | POA: Diagnosis not present

## 2017-05-13 MED ORDER — METHOCARBAMOL 500 MG PO TABS: 500.0000 mg | ORAL_TABLET | Freq: Three times a day (TID) | ORAL | 0 refills | Status: DC | PRN

## 2017-05-13 MED ORDER — MELOXICAM 7.5 MG PO TABS: 7.5000 mg | ORAL_TABLET | Freq: Every day | ORAL | 0 refills | Status: DC

## 2017-05-13 MED ORDER — LEVOTHYROXINE SODIUM 125 MCG PO TABS: 125.0000 ug | ORAL_TABLET | Freq: Every day | ORAL | 5 refills | Status: DC

## 2017-05-13 NOTE — Progress Notes (Signed)
Subjective:    Patient ID: Margaret Golden, female    DOB: May 30, 1990, 27 y.o.   MRN: [id number]  HPI   This is her second week at her new job working at a [employer]. Reports back pain started on Thursday.  Pain is worsened by moving. Laying flat helps some.  Has been using anti-inflammatories,  "pain killers" vicodin, flexeril.  Pain is "all over her back."       Review of Systems  Constitutional: Negative for fever and unexpected weight change.  Genitourinary: Negative for dysuria and frequency.   See HPI  Past Medical History:  Diagnosis Date  . Depression   . History of chicken pox   . History of kidney stones   . Thyroid disease    hypothyroid     Social History   Socioeconomic History  . Marital status: Single    Spouse name: Not on file  . Number of children: Not on file  . Years of education: 70  . Highest education level: Not on file  Social Needs  . Financial resource strain: Not on file  . Food insecurity - worry: Not on file  . Food insecurity - inability: Not on file  . Transportation needs - medical: Not on file  . Transportation needs - non-medical: Not on file  Occupational History  . Not on file  Tobacco Use  . Smoking status: Never Smoker  . Smokeless tobacco: Never Used  Substance and Sexual Activity  . Alcohol use: No    Alcohol/week: 0.0 oz  . Drug use: No  . Sexual activity: No  Other Topics Concern  . Not on file  Social History Narrative   Unemployed   Lives with Dad   Seeking work   Only child   Dog 3   Lives in a one story house with father, has a cat-12/26/16-sjb    Past Surgical History:  Procedure Laterality Date  . KNEE ARTHROSCOPY Right 2007  . TONSILLECTOMY  2010    Family History  Problem Relation Age of Onset  . Cancer Paternal Aunt        colon  . Cancer Paternal Uncle        "small cell cancer"  . Diabetes Maternal Grandfather   . Arthritis Maternal Grandfather   . Arthritis Maternal Grandmother   .  Breast cancer Maternal Grandmother   . Arthritis Paternal Grandmother   . Arthritis Paternal Grandfather     Allergies  Allergen Reactions  . Augmentin [Amoxicillin-Pot Clavulanate]     hives    Current Outpatient Medications on File Prior to Visit  Medication Sig Dispense Refill  . albuterol (PROVENTIL HFA;VENTOLIN HFA) 108 (90 Base) MCG/ACT inhaler Inhale 2 puffs into the lungs every 6 (six) hours as needed for wheezing or shortness of breath. 1 Inhaler 0  . Ascorbic Acid (VITAMIN C) 1000 MG tablet Take 1,000 mg by mouth daily.    . butalbital-acetaminophen-caffeine (FIORICET/CODEINE) 50-325-40-30 MG capsule Take 1 capsule by mouth every 6 (six) hours as needed for migraine. 3 capsule 0  . Ferrous Sulfate (IRON) 28 MG TABS Take 2 tablets by mouth daily.    . fluticasone (FLONASE) 50 MCG/ACT nasal spray Place 2 sprays into both nostrils daily. 16 g 1  . gabapentin (NEURONTIN) 300 MG capsule Take 600 mg by mouth at bedtime.  0  . Gabapentin Enacarbil 600 MG TBCR Take by mouth.    . lamoTRIgine (LAMICTAL) 150 MG tablet Take 1 tablet by mouth daily.  0  . levocetirizine (XYZAL) 5 MG tablet Take 1 tablet (5 mg total) by mouth every evening. 30 tablet 3  . Multiple Vitamins-Minerals (MULTIVITAMIN WITH MINERALS) tablet Take 1 tablet by mouth daily.    . nitroGLYCERIN (NITRODUR - DOSED IN MG/24 HR) 0.2 mg/hr patch Apply 1/4th patch to affected ankle, change daily 30 patch 1  . omeprazole (PRILOSEC) 20 MG capsule Take 2 capsules (40 mg total) by mouth daily. 60 capsule 5  . ondansetron (ZOFRAN ODT) 4 MG disintegrating tablet Take 1 tablet (4 mg total) by mouth every 8 (eight) hours as needed for nausea or vomiting. 20 tablet 0  . sertraline (ZOLOFT) 50 MG tablet Take 2 tablets by mouth daily.  0  . SUMAtriptan (IMITREX) 50 MG tablet Take 1 tablet (50 mg total) by mouth every 2 (two) hours as needed for migraine. May repeat in 2 hours if headache persists or recurs. 10 tablet 0  . topiramate  (TOPAMAX) 50 MG tablet Take 1 tablet (50 mg total) by mouth at bedtime. 30 tablet 5  . traZODone (DESYREL) 100 MG tablet Take 100 mg by mouth at bedtime as needed.  0  . CONCERTA 36 MG CR tablet   0  . sertraline (ZOLOFT) 100 MG tablet   0  . [DISCONTINUED] levothyroxine (SYNTHROID, LEVOTHROID) 125 MCG tablet Take 1 tablet (125 mcg total) by mouth daily. 30 tablet 1   No current facility-administered medications on file prior to visit.     BP 116/82 (BP Location: Left Arm, Patient Position: Sitting, Cuff Size: Large)   Pulse 89   Temp 98.2 F (36.8 C) (Oral)   Resp 20   Ht 5\' 7"  (1.702 m)   Wt (!) 351 lb 3.2 oz (159.3 kg)   SpO2 99%   BMI 55.01 kg/m       Objective:   Physical Exam  Constitutional: She appears well-developed and well-nourished.  Morbidly obese white female, NAD  Cardiovascular: Normal rate, regular rhythm and normal heart sounds.  No murmur heard. Pulmonary/Chest: Effort normal and breath sounds normal. No respiratory distress. She has no wheezes.  Abdominal: Soft. Bowel sounds are normal. She exhibits no distension. There is tenderness in the right upper quadrant and epigastric area.  Psychiatric: She has a normal mood and affect. Her behavior is normal. Judgment and thought content normal.          Assessment & Plan:  Back pain- MS pain versus referred gallbladder pain- will obtain cmet, cbc, abd US. Rx meloxicam prn robaxin.  Not provided for work.  Morbid obesity- dad accompanies her here today. He is interested in patient having a bariatric referral. Pt declines. She is interested in possibly joining the [employer] [employer] though.  Hypothyroid- requests refill on her synthroid. Due for follow up TSH- will obtain.

## 2017-05-13 NOTE — Patient Instructions (Signed)
Please complete lab work prior to leaving. Begin meloxicam (anti-inflammatory) for back pain as well as robaxin (muscle relaxer) as needed.  You may also try a heating pad as needed. Schedule ultrasound on the first floor.  Call if new/worsening symptoms or if not improved in 1 week.

## 2017-05-14 ENCOUNTER — Ambulatory Visit (HOSPITAL_BASED_OUTPATIENT_CLINIC_OR_DEPARTMENT_OTHER)
Admission: RE | Admit: 2017-05-14 | Discharge: 2017-05-14 | Disposition: A | Discharge status: Home / Self Care | Payer: BLUE CROSS/BLUE SHIELD | Source: Ambulatory Visit | Attending: Family | Admitting: Family

## 2017-05-14 ENCOUNTER — Other Ambulatory Visit (INDEPENDENT_AMBULATORY_CARE_PROVIDER_SITE_OTHER): Payer: BLUE CROSS/BLUE SHIELD

## 2017-05-14 DIAGNOSIS — R1011 Right upper quadrant pain: Secondary | ICD-10-CM

## 2017-05-14 DIAGNOSIS — R161 Splenomegaly, not elsewhere classified: Secondary | ICD-10-CM | POA: Insufficient documentation

## 2017-05-14 DIAGNOSIS — K760 Fatty (change of) liver, not elsewhere classified: Secondary | ICD-10-CM | POA: Diagnosis not present

## 2017-05-14 DIAGNOSIS — R93422 Abnormal radiologic findings on diagnostic imaging of left kidney: Secondary | ICD-10-CM | POA: Insufficient documentation

## 2017-05-15 ENCOUNTER — Telehealth: Payer: Self-pay | Admitting: Family

## 2017-05-15 ENCOUNTER — Encounter: Payer: Self-pay | Admitting: Family

## 2017-05-15 ENCOUNTER — Encounter: Payer: Self-pay | Admitting: Physical Therapy

## 2017-05-15 ENCOUNTER — Ambulatory Visit: Payer: BLUE CROSS/BLUE SHIELD | Admitting: Physical Therapy

## 2017-05-15 DIAGNOSIS — K760 Fatty (change of) liver, not elsewhere classified: Secondary | ICD-10-CM | POA: Insufficient documentation

## 2017-05-15 DIAGNOSIS — R2689 Other abnormalities of gait and mobility: Secondary | ICD-10-CM

## 2017-05-15 DIAGNOSIS — M25572 Pain in left ankle and joints of left foot: Secondary | ICD-10-CM | POA: Diagnosis not present

## 2017-05-15 DIAGNOSIS — E039 Hypothyroidism, unspecified: Secondary | ICD-10-CM

## 2017-05-15 LAB — CBC WITH DIFFERENTIAL/PLATELET
BASOS ABS: 0.1 10*3/uL (ref 0.0–0.1)
Basophils Relative: 1.1 % (ref 0.0–3.0)
Eosinophils Absolute: 0.2 10*3/uL (ref 0.0–0.7)
Eosinophils Relative: 2.7 % (ref 0.0–5.0)
HCT: 41.8 % (ref 36.0–46.0)
Hemoglobin: 13.6 g/dL (ref 12.0–15.0)
LYMPHS ABS: 2.5 10*3/uL (ref 0.7–4.0)
LYMPHS PCT: 28.1 % (ref 12.0–46.0)
MCHC: 32.5 g/dL (ref 30.0–36.0)
MCV: 83.1 fl (ref 78.0–100.0)
MONOS PCT: 4.8 % (ref 3.0–12.0)
Monocytes Absolute: 0.4 10*3/uL (ref 0.1–1.0)
NEUTROS ABS: 5.7 10*3/uL (ref 1.4–7.7)
NEUTROS PCT: 63.3 % (ref 43.0–77.0)
PLATELETS: 189 10*3/uL (ref 150.0–400.0)
RBC: 5.03 Mil/uL (ref 3.87–5.11)
RDW: 14.4 % (ref 11.5–15.5)
WBC: 8.9 10*3/uL (ref 4.0–10.5)

## 2017-05-15 LAB — COMPREHENSIVE METABOLIC PANEL
ALT: 19 U/L (ref 0–35)
AST: 30 U/L (ref 0–37)
Albumin: 3.9 g/dL (ref 3.5–5.2)
Alkaline Phosphatase: 81 U/L (ref 39–117)
BILIRUBIN TOTAL: 0.4 mg/dL (ref 0.2–1.2)
BUN: 8 mg/dL (ref 6–23)
CALCIUM: 8.9 mg/dL (ref 8.4–10.5)
CO2: 27 meq/L (ref 19–32)
CREATININE: 0.76 mg/dL (ref 0.40–1.20)
Chloride: 102 mEq/L (ref 96–112)
GFR: 96.9 mL/min (ref >60.00)
GLUCOSE: 79 mg/dL (ref 70–99)
Potassium: 3.8 mEq/L (ref 3.5–5.1)
Sodium: 140 mEq/L (ref 135–145)
TOTAL PROTEIN: 7.2 g/dL (ref 6.0–8.3)

## 2017-05-15 LAB — TSH: TSH: 10.51 u[IU]/mL — ABNORMAL HIGH (ref 0.35–4.50)

## 2017-05-15 MED ORDER — LEVOTHYROXINE SODIUM 150 MCG PO TABS: 150.0000 ug | ORAL_TABLET | Freq: Every day | ORAL | 3 refills | Status: DC

## 2017-05-15 NOTE — Telephone Encounter (Signed)
Also US shows fatty liver.  Treatment is low fat diet, exercise, weight loss.

## 2017-05-15 NOTE — Telephone Encounter (Signed)
Labs show synthroid needs increase. Take first thing in AM on empty stomach, wait 30 minutes to eat. Repeat tsh in 1 month.

## 2017-05-16 NOTE — Therapy (Signed)
[facility] [address] [place], [place], [address] Phone: [phone]   Fax:  [phone]  Physical Therapy Treatment  Patient Details  Name: Margaret Golden MRN: [id number] Date of Birth: 04/01/1991 Referring Provider: Norton BlizzardShane Hudnall   Encounter Date: 05/15/2017  PT End of Session - 05/15/17 1621    Visit Number  5    Number of Visits  12    Date for PT Re-Evaluation  06/10/17    PT Start Time  1605    PT Stop Time  1649    PT Time Calculation (min)  44 min    Activity Tolerance  Patient tolerated treatment well    Behavior During Therapy  [facility] for tasks assessed/performed       Past Medical History:  Diagnosis Date  . Depression   . Fatty liver   . History of chicken pox   . History of kidney stones   . Thyroid disease    hypothyroid    Past Surgical History:  Procedure Laterality Date  . KNEE ARTHROSCOPY Right 2007  . TONSILLECTOMY  2010    There were no vitals filed for this visit.  Subjective Assessment - 05/15/17 1620    Subjective  Pt states decreased pain in foot and in ankle.     Currently in Pain?  Yes    Pain Score  1     Pain Location  Ankle    Pain Orientation  Left    Pain Descriptors / Indicators  Aching;Dull    Pain Type  Chronic pain    Pain Onset  More than a month ago    Pain Frequency  Intermittent                      [facility] Adult PT Treatment/Exercise - 05/15/17 1622      Ambulation/Gait   Gait Comments  25 ft x2 lateral stepping; fwd walk 35 ft x8 ;       Knee/Hip Exercises: Aerobic   Stationary Bike  L1x8 min      Knee/Hip Exercises: Standing   Hip Flexion  20 reps    Hip Flexion Limitations  Marching    Hip Abduction  2 sets;10 reps;Both    Hip Extension  2 sets;10 reps    Forward Step Up  10 reps;Both    Forward Step Up Limitations  AirEx    SLS  30 sec x 3 bil    Other Standing Knee Exercises  Tandem stance 30 sec x 3 bil      Ankle Exercises: Seated   Heel  Raises  20 reps      Ankle Exercises: Standing   Heel Raises  20 reps      Ankle Exercises: Supine   T-Band  INV/EV GTB x25      Ankle Exercises: Stretches   Gastroc Stretch  3 reps;30 seconds    Gastroc Stretch Limitations  at wall             PT Education - 05/15/17 1621    Education Details  HEP    Person(s) Educated  Patient    Methods  Explanation    Comprehension  Verbalized understanding       PT Short Term Goals - 04/29/17 1538      PT SHORT TERM GOAL #1   Title  Pt to be independent with HEP    Period  Weeks    Status  New  Target Date  05/13/17        PT Long Term Goals - 04/29/17 1538      PT LONG TERM GOAL #1   Title  Pt to report decreased pain in L ankle, to 0-2/10 with standing/walking    Period  Weeks    Status  New    Target Date  06/10/17      PT LONG TERM GOAL #2   Title  Pt to demo improved dynamic stability of L ankle, to be WNL , to improve ability for community navigation    Time  6    Period  Weeks    Status  New    Target Date  06/10/17      PT LONG TERM GOAL #3   Title  Pt to demo improved ambulation mechanics, to be WNL for pt age, with improved speed, heel strike, and push off, with distances up to 1 mile     Time  6    Period  Weeks    Status  New    Target Date  06/10/17            Plan - 05/16/17 0919    Clinical Impression Statement  Pt with improving gait pattern, with decreased antalgia, improved equality of step length and stance time, as well as speed. Pt challenged with SLS and stability exercises, but is improving overall with strength and strengthening exercises. Plan to continue progression of stability as tolerated.     Rehab Potential  Good    PT Frequency  2x / week    PT Duration  6 weeks    PT Treatment/Interventions  ADLs/Self Care Home Management;Cryotherapy;Electrical Stimulation;Iontophoresis 4mg /ml Dexamethasone;Moist Heat;Therapeutic activities;Therapeutic exercise;Functional mobility  training;Stair training;Gait training;DME Instruction;Ultrasound;Balance training;Neuromuscular re-education;Patient/family education;Orthotic Fit/Training;Manual techniques;Taping;Dry needling;Passive range of motion    PT Next Visit Plan  Progress strength and stability     Consulted and Agree with Plan of Care  Patient       Patient will benefit from skilled therapeutic intervention in order to improve the following deficits and impairments:  Abnormal gait, Decreased endurance, Obesity, Increased edema, Decreased activity tolerance, Decreased strength, Pain, Difficulty walking, Decreased mobility, Decreased balance, Decreased range of motion, Impaired flexibility  Visit Diagnosis: Pain in left ankle and joints of left foot  Other abnormalities of gait and mobility     Problem List Patient Active Problem List   Diagnosis Date Noted  . Fatty liver   . Left ankle pain 02/28/2017  . Morbid obesity (HCC) 05/11/2016  . Fatty liver disease, nonalcoholic 02/02/2015  . Depression 01/03/2015  . Hypothyroidism 01/03/2015  . GERD (gastroesophageal reflux disease) 01/03/2015  . Migraines 01/03/2015  . Essential hypertension 06/02/2014   Sedalia Muta, PT, DPT 9:21 AM  05/16/17    [facility] [address] [place], [place], [address] Phone: [phone]   Fax:  [phone]  Name: NOELA BROTHERS MRN: [id number] Date of Birth: 05-01-1990

## 2017-05-16 NOTE — Telephone Encounter (Signed)
Left message to return call 

## 2017-05-17 NOTE — Addendum Note (Signed)
Addended by: Mervin KungFERGERSON, Airabella Barley A on: 05/17/2017 01:50 PM   Modules accepted: Orders

## 2017-05-17 NOTE — Telephone Encounter (Signed)
Attempted to reach pt and left message on voicemail to check mychart message. Message sent. Lab order entered and lab appt made.

## 2017-05-20 ENCOUNTER — Ambulatory Visit: Payer: BLUE CROSS/BLUE SHIELD | Admitting: Physical Therapy

## 2017-05-20 DIAGNOSIS — R2689 Other abnormalities of gait and mobility: Secondary | ICD-10-CM

## 2017-05-20 DIAGNOSIS — M25572 Pain in left ankle and joints of left foot: Secondary | ICD-10-CM

## 2017-05-20 NOTE — Therapy (Signed)
[facility] [address] [place], [place], [address] Phone: [phone]   Fax:  [phone]  Physical Therapy Treatment  Patient Details  Name: Margaret Golden MRN: [id number] Date of Birth: 10/07/1990 Referring Provider: Norton BlizzardShane Hudnall   Encounter Date: 05/20/2017  PT End of Session - 05/20/17 1658    Visit Number  6    Number of Visits  12    Date for PT Re-Evaluation  06/10/17    PT Start Time  1600    PT Stop Time  1645    PT Time Calculation (min)  45 min    Activity Tolerance  Patient tolerated treatment well    Behavior During Therapy  [facility] for tasks assessed/performed       Past Medical History:  Diagnosis Date  . Depression   . Fatty liver   . History of chicken pox   . History of kidney stones   . Thyroid disease    hypothyroid    Past Surgical History:  Procedure Laterality Date  . KNEE ARTHROSCOPY Right 2007  . TONSILLECTOMY  2010    There were no vitals filed for this visit.  Subjective Assessment - 05/20/17 1657    Subjective  Pt with mild soreness at bottom of foot due to increaed standing at work, but minimal ankle pain. She has been getting some soreness after sessions, but does not last more than 1 day.     Currently in Pain?  Yes    Pain Score  2     Pain Location  Ankle    Pain Orientation  Left    Pain Descriptors / Indicators  Aching    Pain Type  Chronic pain    Pain Onset  More than a month ago    Pain Frequency  Intermittent                      [facility] Adult PT Treatment/Exercise - 05/20/17 1632      Ambulation/Gait   Gait Comments  25 ft x2 lateral stepping; fwd walk 35 ft x8 ;       Knee/Hip Exercises: Aerobic   Stationary Bike  L1x8 min      Knee/Hip Exercises: Standing   Hip Abduction  2 sets;10 reps;Both    Hip Extension  2 sets;10 reps    Lateral Step Up  Both;15 reps;Limitations    Lateral Step Up Limitations  AirEx    Forward Step Up  Both;15 reps    Forward  Step Up Limitations  AirEx    SLS  30 sec x 3 bil    Other Standing Knee Exercises  Tandem stance 30 sec x 3 bil    Other Standing Knee Exercises  March/Walk 15 ft x6      Ankle Exercises: Stretches   Gastroc Stretch  3 reps;30 seconds    Gastroc Stretch Limitations  at wall      Ankle Exercises: Standing   Heel Raises  20 reps      Ankle Exercises: Seated   Heel Raises  20 reps      Ankle Exercises: Supine   T-Band  INV/EV GTB x25             PT Education - 05/20/17 1658    Education provided  Yes    Education Details  HEP progression     Person(s) Educated  Patient    Methods  Explanation    Comprehension  Verbalized understanding  PT Short Term Goals - 04/29/17 1538      PT SHORT TERM GOAL #1   Title  Pt to be independent with HEP    Period  Weeks    Status  New    Target Date  05/13/17        PT Long Term Goals - 04/29/17 1538      PT LONG TERM GOAL #1   Title  Pt to report decreased pain in L ankle, to 0-2/10 with standing/walking    Period  Weeks    Status  New    Target Date  06/10/17      PT LONG TERM GOAL #2   Title  Pt to demo improved dynamic stability of L ankle, to be WNL , to improve ability for community navigation    Time  6    Period  Weeks    Status  New    Target Date  06/10/17      PT LONG TERM GOAL #3   Title  Pt to demo improved ambulation mechanics, to be WNL for pt age, with improved speed, heel strike, and push off, with distances up to 1 mile     Time  6    Period  Weeks    Status  New    Target Date  06/10/17            Plan - 05/20/17 1659    Clinical Impression Statement  Pt showing improvements with strength, gait mechanics, and gait speed. She is challenged with stability, but has been able to progress balance training. recommend continued care.     PT Treatment/Interventions  ADLs/Self Care Home Management;Cryotherapy;Electrical Stimulation;Iontophoresis 4mg /ml Dexamethasone;Moist Heat;Therapeutic  activities;Therapeutic exercise;Functional mobility training;Stair training;Gait training;DME Instruction;Ultrasound;Balance training;Neuromuscular re-education;Patient/family education;Orthotic Fit/Training;Manual techniques;Taping;Dry needling;Passive range of motion    PT Next Visit Plan  Progress strength and stability        Patient will benefit from skilled therapeutic intervention in order to improve the following deficits and impairments:  Abnormal gait, Decreased endurance, Obesity, Increased edema, Decreased activity tolerance, Decreased strength, Pain, Difficulty walking, Decreased mobility, Decreased balance, Decreased range of motion, Impaired flexibility  Visit Diagnosis: Pain in left ankle and joints of left foot  Other abnormalities of gait and mobility     Problem List Patient Active Problem List   Diagnosis Date Noted  . Fatty liver   . Left ankle pain 02/28/2017  . Morbid obesity (HCC) 05/11/2016  . Fatty liver disease, nonalcoholic 02/02/2015  . Depression 01/03/2015  . Hypothyroidism 01/03/2015  . GERD (gastroesophageal reflux disease) 01/03/2015  . Migraines 01/03/2015  . Essential hypertension 06/02/2014   Sedalia Muta, PT, DPT 5:00 PM  05/20/17     [facility] [address] [place], [place], [address] Phone: [phone]   Fax:  [phone]  Name: Margaret Golden MRN: [id number] Date of Birth: 08-Apr-1990

## 2017-05-21 DIAGNOSIS — F4322 Adjustment disorder with anxiety: Secondary | ICD-10-CM | POA: Diagnosis not present

## 2017-05-23 ENCOUNTER — Ambulatory Visit: Payer: BLUE CROSS/BLUE SHIELD | Admitting: Family Medicine

## 2017-05-23 ENCOUNTER — Encounter: Payer: Self-pay | Admitting: Family Medicine

## 2017-05-23 DIAGNOSIS — M25572 Pain in left ankle and joints of left foot: Secondary | ICD-10-CM | POA: Diagnosis not present

## 2017-05-23 DIAGNOSIS — G8929 Other chronic pain: Secondary | ICD-10-CM | POA: Diagnosis not present

## 2017-05-23 NOTE — Patient Instructions (Signed)
You're doing great! Do physical therapy for 1-3 more weeks then discontinue. Do home exercises 2-3 times a week indefinitely. Use the ankle brace with long distances, irregular surfaces. Follow up with me as needed otherwise.

## 2017-05-24 ENCOUNTER — Encounter: Payer: Self-pay | Admitting: Family Medicine

## 2017-05-24 NOTE — Assessment & Plan Note (Signed)
2/2 chronic instability and peroneal tendinopathy.  Continue with physical therapy and home exercises - expect her to transition to HEP over next 1-3 weeks.  Encouraged doing home exercises after that 2-3 weeks indefinitely with her chronic instability.  Ankle brace for long distances, irregular surfaces.  Tylenol only if needed.  F/u prn.

## 2017-05-24 NOTE — Progress Notes (Signed)
PCP and consultation requested by: [name], NP  Subjective:   HPI: Patient is a 27 y.o. female here for left ankle pain.  11/28: Patient reports she's had several injuries of left ankle dating back to middle school. States she's had to wear a boot about every 6 months due to injuries, pain of this ankle. Pain level 3/10 but up to 7/10 and sharp laterally. Associated swelling. She did physical therapy remotely. Current pain worse the past week. No skin changes, numbness.  04/11/17: Patient reports she feels about the same. Pain level 7/10 and sharp laterally. Wearing ASO and doing home exercises though not daily.   Some swelling. No skin changes, numbness.  2/21: Patient reports she's doing much better. Doing physical therapy and home exercise program. Ankle feels more stable. Will use nitro on some days but not taking regularly. Iced at beginning but none now. Wears ankle brace at work and when on feet a lot. No skin changes, numbness.   Past Medical History:  Diagnosis Date  . Depression   . Fatty liver   . History of chicken pox   . History of kidney stones   . Thyroid disease    hypothyroid    Current Outpatient Medications on File Prior to Visit  Medication Sig Dispense Refill  . albuterol (PROVENTIL HFA;VENTOLIN HFA) 108 (90 Base) MCG/ACT inhaler Inhale 2 puffs into the lungs every 6 (six) hours as needed for wheezing or shortness of breath. 1 Inhaler 0  . Ascorbic Acid (VITAMIN C) 1000 MG tablet Take 1,000 mg by mouth daily.    . CONCERTA 36 MG CR tablet   0  . Ferrous Sulfate (IRON) 28 MG TABS Take 2 tablets by mouth daily.    . fluticasone (FLONASE) 50 MCG/ACT nasal spray Place 2 sprays into both nostrils daily. 16 g 1  . gabapentin (NEURONTIN) 300 MG capsule Take 600 mg by mouth at bedtime.  0  . Gabapentin Enacarbil 600 MG TBCR Take by mouth.    . lamoTRIgine (LAMICTAL) 150 MG tablet Take 1 tablet by mouth daily.  0  . levocetirizine (XYZAL) 5  MG tablet Take 1 tablet (5 mg total) by mouth every evening. 30 tablet 3  . levothyroxine (SYNTHROID, LEVOTHROID) 150 MCG tablet Take 1 tablet (150 mcg total) by mouth daily. 30 tablet 3  . meloxicam (MOBIC) 7.5 MG tablet Take 1 tablet (7.5 mg total) by mouth daily. 14 tablet 0  . methocarbamol (ROBAXIN) 500 MG tablet Take 1 tablet (500 mg total) by mouth every 8 (eight) hours as needed for muscle spasms. 20 tablet 0  . Multiple Vitamins-Minerals (MULTIVITAMIN WITH MINERALS) tablet Take 1 tablet by mouth daily.    . nitroGLYCERIN (NITRODUR - DOSED IN MG/24 HR) 0.2 mg/hr patch Apply 1/4th patch to affected ankle, change daily 30 patch 1  . omeprazole (PRILOSEC) 20 MG capsule Take 2 capsules (40 mg total) by mouth daily. 60 capsule 5  . ondansetron (ZOFRAN ODT) 4 MG disintegrating tablet Take 1 tablet (4 mg total) by mouth every 8 (eight) hours as needed for nausea or vomiting. 20 tablet 0  . sertraline (ZOLOFT) 100 MG tablet   0  . sertraline (ZOLOFT) 50 MG tablet Take 2 tablets by mouth daily.  0  . SUMAtriptan (IMITREX) 50 MG tablet Take 1 tablet (50 mg total) by mouth every 2 (two) hours as needed for migraine. May repeat in 2 hours if headache persists or recurs. 10 tablet 0  . topiramate (TOPAMAX) 50 MG  tablet Take 1 tablet (50 mg total) by mouth at bedtime. 30 tablet 5  . traZODone (DESYREL) 100 MG tablet Take 100 mg by mouth at bedtime as needed.  0   No current facility-administered medications on file prior to visit.     Past Surgical History:  Procedure Laterality Date  . KNEE ARTHROSCOPY Right 2007  . TONSILLECTOMY  2010    Allergies  Allergen Reactions  . Augmentin [Amoxicillin-Pot Clavulanate]     hives    Social History   Socioeconomic History  . Marital status: Single    Spouse name: Not on file  . Number of children: Not on file  . Years of education: 6412  . Highest education level: Not on file  Social Needs  . Financial resource strain: Not on file  . Food  insecurity - worry: Not on file  . Food insecurity - inability: Not on file  . Transportation needs - medical: Not on file  . Transportation needs - non-medical: Not on file  Occupational History  . Not on file  Tobacco Use  . Smoking status: Never Smoker  . Smokeless tobacco: Never Used  Substance and Sexual Activity  . Alcohol use: No    Alcohol/week: 0.0 oz  . Drug use: No  . Sexual activity: No  Other Topics Concern  . Not on file  Social History Narrative   Unemployed   Lives with Dad   Seeking work   Only child   Dog 3   Lives in a one story house with father, has a cat-12/26/16-sjb    Family History  Problem Relation Age of Onset  . Cancer Paternal Aunt        colon  . Cancer Paternal Uncle        "small cell cancer"  . Diabetes Maternal Grandfather   . Arthritis Maternal Grandfather   . Arthritis Maternal Grandmother   . Breast cancer Maternal Grandmother   . Arthritis Paternal Grandmother   . Arthritis Paternal Grandfather     BP 134/72   Pulse 80   Ht 5\' 7"  (1.702 m)   Wt (!) 347 lb (157.4 kg)   BMI 54.35 kg/m   Review of Systems: See HPI above.     Objective:  Physical Exam:  Gen: NAD, comfortable in exam room.  Left ankle: Mild circumferential swelling lower leg but not in ankle or foot.  No bruising, other deformity. FROM with 5/5 strength all directions grossly. No TTP currently. Trace ant drawer and talar tilt.   Negative syndesmotic compression. Thompsons test negative. NV intact distally.   Assessment & Plan:  1. Left ankle pain - 2/2 chronic instability and peroneal tendinopathy.  Continue with physical therapy and home exercises - expect her to transition to HEP over next 1-3 weeks.  Encouraged doing home exercises after that 2-3 weeks indefinitely with her chronic instability.  Ankle brace for long distances, irregular surfaces.  Tylenol only if needed.  F/u prn.

## 2017-05-27 ENCOUNTER — Ambulatory Visit: Payer: BLUE CROSS/BLUE SHIELD | Admitting: Physical Therapy

## 2017-05-27 DIAGNOSIS — F4322 Adjustment disorder with anxiety: Secondary | ICD-10-CM | POA: Diagnosis not present

## 2017-05-27 DIAGNOSIS — M25572 Pain in left ankle and joints of left foot: Secondary | ICD-10-CM | POA: Diagnosis not present

## 2017-05-27 DIAGNOSIS — R2689 Other abnormalities of gait and mobility: Secondary | ICD-10-CM

## 2017-05-27 NOTE — Therapy (Signed)
[facility] [address] [place], [place], [address] Phone: [phone]   Fax:  [phone]  Physical Therapy Treatment  Patient Details  Name: Margaret Golden MRN: [id number] Date of Birth: 09/23/1990 Referring Provider: Norton BlizzardShane Hudnall   Encounter Date: 05/27/2017  PT End of Session - 05/27/17 1654    Visit Number  7    Number of Visits  12    Date for PT Re-Evaluation  06/10/17    PT Start Time  1610    PT Stop Time  1648    PT Time Calculation (min)  38 min    Activity Tolerance  Patient tolerated treatment well    Behavior During Therapy  [facility] for tasks assessed/performed       Past Medical History:  Diagnosis Date  . Depression   . Fatty liver   . History of chicken pox   . History of kidney stones   . Thyroid disease    hypothyroid    Past Surgical History:  Procedure Laterality Date  . KNEE ARTHROSCOPY Right 2007  . TONSILLECTOMY  2010    There were no vitals filed for this visit.  Subjective Assessment - 05/27/17 1654    Subjective  Pt states improved pain, minimal soreness with increased activity lately     Currently in Pain?  No/denies    Pain Score  0-No pain                      [facility] Adult PT Treatment/Exercise - 05/27/17 0001      Ambulation/Gait   Gait Comments  25 ft x2 lateral stepping; fwd walk 35 ft x8 ;       Knee/Hip Exercises: Aerobic   Stationary Bike  L1x8 min      Knee/Hip Exercises: Standing   Hip Abduction  2 sets;10 reps;Both    Forward Step Up  Both;15 reps    Forward Step Up Limitations  AirEx    SLS  30 sec x 3 bil    Other Standing Knee Exercises  Tandem stance 30 sec x 3 bil    Other Standing Knee Exercises  March/Walk 15 ft x6      Ankle Exercises: Stretches   Gastroc Stretch  3 reps;30 seconds    Gastroc Stretch Limitations  at wall      Ankle Exercises: Standing   Heel Raises  20 reps    Other Standing Ankle Exercises  Tandem stance 30 sec x3 bil;       Other Standing Ankle Exercises  AirEx: Weight shifts L/R and A/P x25 each      Ankle Exercises: Supine   T-Band  all motions GTB x25             PT Education - 05/27/17 1654    Education provided  Yes    Education Details  HEP    Person(s) Educated  Patient    Methods  Explanation    Comprehension  Verbalized understanding       PT Short Term Goals - 04/29/17 1538      PT SHORT TERM GOAL #1   Title  Pt to be independent with HEP    Period  Weeks    Status  New    Target Date  05/13/17        PT Long Term Goals - 04/29/17 1538      PT LONG TERM GOAL #1   Title  Pt to report decreased  pain in L ankle, to 0-2/10 with standing/walking    Period  Weeks    Status  New    Target Date  06/10/17      PT LONG TERM GOAL #2   Title  Pt to demo improved dynamic stability of L ankle, to be WNL , to improve ability for community navigation    Time  6    Period  Weeks    Status  New    Target Date  06/10/17      PT LONG TERM GOAL #3   Title  Pt to demo improved ambulation mechanics, to be WNL for pt age, with improved speed, heel strike, and push off, with distances up to 1 mile     Time  6    Period  Weeks    Status  New    Target Date  06/10/17            Plan - 05/27/17 1655    Clinical Impression Statement  Pt showing good improvements wtih pain and mobility. She is challenged with stability exercises, but showing improvements. She is still very challenged wtih SLS. Plan to work towards d/c to HEP in next 1-2 weeks.     PT Treatment/Interventions  ADLs/Self Care Home Management;Cryotherapy;Electrical Stimulation;Iontophoresis 4mg /ml Dexamethasone;Moist Heat;Therapeutic activities;Therapeutic exercise;Functional mobility training;Stair training;Gait training;DME Instruction;Ultrasound;Balance training;Neuromuscular re-education;Patient/family education;Orthotic Fit/Training;Manual techniques;Taping;Dry needling;Passive range of motion    PT Next Visit Plan   Progress strength and stability        Patient will benefit from skilled therapeutic intervention in order to improve the following deficits and impairments:  Abnormal gait, Decreased endurance, Obesity, Increased edema, Decreased activity tolerance, Decreased strength, Pain, Difficulty walking, Decreased mobility, Decreased balance, Decreased range of motion, Impaired flexibility  Visit Diagnosis: Pain in left ankle and joints of left foot  Other abnormalities of gait and mobility     Problem List Patient Active Problem List   Diagnosis Date Noted  . Fatty liver   . Left ankle pain 02/28/2017  . Morbid obesity (HCC) 05/11/2016  . Fatty liver disease, nonalcoholic 02/02/2015  . Depression 01/03/2015  . Hypothyroidism 01/03/2015  . GERD (gastroesophageal reflux disease) 01/03/2015  . Migraines 01/03/2015  . Essential hypertension 06/02/2014   Sedalia Muta, PT, DPT 4:56 PM  05/27/17    [facility] [address] [place], [place], [address] Phone: [phone]   Fax:  [phone]  Name: Margaret Golden MRN: [id number] Date of Birth: 1991/02/09

## 2017-05-29 ENCOUNTER — Ambulatory Visit: Payer: BLUE CROSS/BLUE SHIELD | Admitting: Physical Therapy

## 2017-05-29 ENCOUNTER — Encounter: Payer: Self-pay | Admitting: Physical Therapy

## 2017-05-29 DIAGNOSIS — M25572 Pain in left ankle and joints of left foot: Secondary | ICD-10-CM | POA: Diagnosis not present

## 2017-05-29 DIAGNOSIS — R2689 Other abnormalities of gait and mobility: Secondary | ICD-10-CM | POA: Diagnosis not present

## 2017-05-29 NOTE — Therapy (Signed)
[facility] [address] , [place], [address] Phone: [phone]   Fax:  [phone]  Physical Therapy Treatment  Patient Details  Name: Margaret Golden MRN: [id number] Date of Birth: 04/06/1990 Referring Provider: Norton BlizzardShane Hudnall   Encounter Date: 05/29/2017  PT End of Session - 05/29/17 1606    Visit Number  8    Number of Visits  12    Date for PT Re-Evaluation  06/10/17    PT Start Time  1600    PT Stop Time  1638    PT Time Calculation (min)  38 min    Activity Tolerance  Patient tolerated treatment well    Behavior During Therapy  [facility] for tasks assessed/performed       Past Medical History:  Diagnosis Date  . Depression   . Fatty liver   . History of chicken pox   . History of kidney stones   . Thyroid disease    hypothyroid    Past Surgical History:  Procedure Laterality Date  . KNEE ARTHROSCOPY Right 2007  . TONSILLECTOMY  2010    There were no vitals filed for this visit.  Subjective Assessment - 05/29/17 1603    Subjective  Pt states mild soreness today, but overall improvement in pain.     Currently in Pain?  Yes    Pain Score  1     Pain Location  Ankle    Pain Orientation  Left    Pain Descriptors / Indicators  Aching    Pain Onset  More than a month ago    Pain Frequency  Intermittent                      [facility] Adult PT Treatment/Exercise - 05/29/17 1608      Ambulation/Gait   Gait Comments  25 ft x8 fwd walk      Knee/Hip Exercises: Aerobic   Stationary Bike  L1x8 min      Knee/Hip Exercises: Standing   Hip Abduction  2 sets;10 reps;Both    Forward Step Up  Both;15 reps    Forward Step Up Limitations  AirEx    SLS  30 sec x 3 bil    Other Standing Knee Exercises  Tandem stance 30 sec x 3 bil    Other Standing Knee Exercises  March/Walk 15 ft x6      Ankle Exercises: Stretches   Gastroc Stretch  3 reps;30 seconds    Gastroc Stretch Limitations  at wall      Ankle  Exercises: Standing   Heel Raises  20 reps    Other Standing Ankle Exercises  Tandem stance 30 sec x3 bil;      Other Standing Ankle Exercises  AirEx: Weight shifts L/R and A/P x25 each      Ankle Exercises: Supine   T-Band  all motions GTB x25             PT Education - 05/29/17 1604    Education provided  Yes    Education Details  HEP    Person(s) Educated  Patient    Methods  Explanation    Comprehension  Verbalized understanding       PT Short Term Goals - 04/29/17 1538      PT SHORT TERM GOAL #1   Title  Pt to be independent with HEP    Period  Weeks    Status  New    Target Date  05/13/17        PT Long Term Goals - 04/29/17 1538      PT LONG TERM GOAL #1   Title  Pt to report decreased pain in L ankle, to 0-2/10 with standing/walking    Period  Weeks    Status  New    Target Date  06/10/17      PT LONG TERM GOAL #2   Title  Pt to demo improved dynamic stability of L ankle, to be WNL , to improve ability for community navigation    Time  6    Period  Weeks    Status  New    Target Date  06/10/17      PT LONG TERM GOAL #3   Title  Pt to demo improved ambulation mechanics, to be WNL for pt age, with improved speed, heel strike, and push off, with distances up to 1 mile     Time  6    Period  Weeks    Status  New    Target Date  06/10/17            Plan - 05/29/17 1642    Clinical Impression Statement  Pt continues to show improvements with strength and stability. Decreased sway today with SLS, and increased hold time noted. Discussed recommendation to increase walking distance at home, pt has not yet started walking program. Plan to see pt for one visit next week, and d/c to HEP.     PT Treatment/Interventions  ADLs/Self Care Home Management;Cryotherapy;Electrical Stimulation;Iontophoresis 4mg /ml Dexamethasone;Moist Heat;Therapeutic activities;Therapeutic exercise;Functional mobility training;Stair training;Gait training;DME  Instruction;Ultrasound;Balance training;Neuromuscular re-education;Patient/family education;Orthotic Fit/Training;Manual techniques;Taping;Dry needling;Passive range of motion    PT Next Visit Plan  Progress strength and stability        Patient will benefit from skilled therapeutic intervention in order to improve the following deficits and impairments:  Abnormal gait, Decreased endurance, Obesity, Increased edema, Decreased activity tolerance, Decreased strength, Pain, Difficulty walking, Decreased mobility, Decreased balance, Decreased range of motion, Impaired flexibility  Visit Diagnosis: Pain in left ankle and joints of left foot  Other abnormalities of gait and mobility     Problem List Patient Active Problem List   Diagnosis Date Noted  . Fatty liver   . Left ankle pain 02/28/2017  . Morbid obesity (HCC) 05/11/2016  . Fatty liver disease, nonalcoholic 02/02/2015  . Depression 01/03/2015  . Hypothyroidism 01/03/2015  . GERD (gastroesophageal reflux disease) 01/03/2015  . Migraines 01/03/2015  . Essential hypertension 06/02/2014   Sedalia Muta, PT, DPT 4:44 PM  05/29/17     [facility] [address] [place], [place], [address] Phone: [phone]   Fax:  [phone]  Name: Margaret Golden MRN: [id number] Date of Birth: 11-17-1990

## 2017-06-03 ENCOUNTER — Ambulatory Visit: Payer: BLUE CROSS/BLUE SHIELD | Admitting: Physical Therapy

## 2017-06-03 DIAGNOSIS — F4322 Adjustment disorder with anxiety: Secondary | ICD-10-CM | POA: Diagnosis not present

## 2017-06-03 DIAGNOSIS — R2689 Other abnormalities of gait and mobility: Secondary | ICD-10-CM

## 2017-06-03 DIAGNOSIS — M25572 Pain in left ankle and joints of left foot: Secondary | ICD-10-CM

## 2017-06-03 NOTE — Therapy (Signed)
[facility] [address] [place], [place], [address] Phone: [phone]   Fax:  [phone]  Physical Therapy Treatment  Patient Details  Name: Margaret Golden MRN: [id number] Date of Birth: May 31, 1990 Referring Provider: Karlton Lemon   Encounter Date: 06/03/2017  PT End of Session - 06/03/17 1618    Visit Number  9    Number of Visits  12    Date for PT Re-Evaluation  06/10/17    PT Start Time  1608    PT Stop Time  1650    PT Time Calculation (min)  42 min    Activity Tolerance  Patient tolerated treatment well    Behavior During Therapy  [facility] for tasks assessed/performed       Past Medical History:  Diagnosis Date  . Depression   . Fatty liver   . History of chicken pox   . History of kidney stones   . Thyroid disease    hypothyroid    Past Surgical History:  Procedure Laterality Date  . KNEE ARTHROSCOPY Right 2007  . TONSILLECTOMY  2010    There were no vitals filed for this visit.  Subjective Assessment - 06/03/17 1617    Subjective  Pt states no pain. She has been doing HEP, and feels that she is much improved.     Currently in Pain?  No/denies    Pain Score  0-No pain         [facility] PT Assessment - 06/03/17 0001      AROM   Overall AROM Comments  WNL      Strength   Overall Strength Comments  5/5      Palpation   Palpation comment  Dynamic Stability: WNL                  [facility] Adult PT Treatment/Exercise - 06/03/17 1621      Ambulation/Gait   Gait Comments  25 ft x 10 fwd walk      Knee/Hip Exercises: Aerobic   Stationary Bike  L1x8 min      Knee/Hip Exercises: Standing   Hip Abduction  2 sets;10 reps;Both    Lateral Step Up  Both;15 reps;Limitations    Lateral Step Up Limitations  AirEx    Forward Step Up  Both;15 reps    Forward Step Up Limitations  AirEx    SLS  30 sec x 3 bil    Other Standing Knee Exercises  Tandem stance 30 sec x 3 bil on AirEx      Ankle Exercises:  Stretches   Gastroc Stretch  3 reps;30 seconds    Gastroc Stretch Limitations  at wall      Ankle Exercises: Standing   Heel Raises  20 reps    Other Standing Ankle Exercises  AirEx: Weight shifts L/R and A/P x25 each             PT Education - 06/03/17 1618    Education provided  Yes    Education Details  HEP, D/c plan     Person(s) Educated  Patient    Methods  Explanation    Comprehension  Verbalized understanding       PT Short Term Goals - 06/03/17 1618      PT SHORT TERM GOAL #1   Title  Pt to be independent with HEP    Period  Weeks    Status  Achieved        PT Long Term  Goals - 06/03/17 1618      PT LONG TERM GOAL #1   Title  Pt to report decreased pain in L ankle, to 0-2/10 with standing/walking    Period  Weeks    Status  Achieved      PT LONG TERM GOAL #2   Title  Pt to demo improved dynamic stability of L ankle, to be WNL , to improve ability for community navigation    Time  6    Period  Weeks    Status  Achieved      PT LONG TERM GOAL #3   Title  Pt to demo improved ambulation mechanics, to be WNL for pt age, with improved speed, heel strike, and push off, with distances up to 1 mile     Time  6    Period  Weeks    Status  Achieved            Plan - 06/03/17 1650    Clinical Impression Statement  Pt has met all goals for PT, and is ready for d/c to HEP. She has full ROM and strength and much improved stability. She has much improved gait pattern and endurance. Discussed possible change in footwear in future. She has been educated on final HEP for strength and stability. Pt in agreement with plan.     PT Treatment/Interventions  ADLs/Self Care Home Management;Cryotherapy;Electrical Stimulation;Iontophoresis 81m/ml Dexamethasone;Moist Heat;Therapeutic activities;Therapeutic exercise;Functional mobility training;Stair training;Gait training;DME Instruction;Ultrasound;Balance training;Neuromuscular re-education;Patient/family  education;Orthotic Fit/Training;Manual techniques;Taping;Dry needling;Passive range of motion    Consulted and Agree with Plan of Care  Patient       Patient will benefit from skilled therapeutic intervention in order to improve the following deficits and impairments:  Abnormal gait, Decreased endurance, Obesity, Increased edema, Decreased activity tolerance, Decreased strength, Pain, Difficulty walking, Decreased mobility, Decreased balance, Decreased range of motion, Impaired flexibility  Visit Diagnosis: Pain in left ankle and joints of left foot  Other abnormalities of gait and mobility     Problem List Patient Active Problem List   Diagnosis Date Noted  . Fatty liver   . Left ankle pain 02/28/2017  . Morbid [place] 05/11/2016  . Fatty liver disease, nonalcoholic 180/99/8338 . Depression 01/03/2015  . Hypothyroidism 01/03/2015  . GERD (gastroesophageal reflux disease) 01/03/2015  . Migraines 01/03/2015  . Essential hypertension 06/02/2014    LLyndee Hensen PT, DPT 4:53 PM  06/03/17    [facility] [address] [place] [address]Phone: [phone]  Fax:  [phone] Name: Margaret CARRMRN: [id number] of Birth: [birth year]  PHYSICAL THERAPY DISCHARGE SUMMARY  Visits from Start of Care: 9    Plan: Patient agrees to discharge.  Patient goals were met. Patient is being discharged due to meeting the stated rehab goals.  ?????     LLyndee Hensen PT, DPT 4:53 PM  06/03/17

## 2017-06-05 DIAGNOSIS — F331 Major depressive disorder, recurrent, moderate: Secondary | ICD-10-CM | POA: Diagnosis not present

## 2017-06-14 ENCOUNTER — Other Ambulatory Visit: Payer: BLUE CROSS/BLUE SHIELD

## 2017-06-14 DIAGNOSIS — F4322 Adjustment disorder with anxiety: Secondary | ICD-10-CM | POA: Diagnosis not present

## 2017-06-17 DIAGNOSIS — F4322 Adjustment disorder with anxiety: Secondary | ICD-10-CM | POA: Diagnosis not present

## 2017-06-21 ENCOUNTER — Ambulatory Visit: Payer: BLUE CROSS/BLUE SHIELD | Admitting: Family

## 2017-06-24 ENCOUNTER — Encounter: Payer: Self-pay | Admitting: Family

## 2017-06-24 ENCOUNTER — Ambulatory Visit: Payer: BLUE CROSS/BLUE SHIELD | Admitting: Family

## 2017-06-24 VITALS — BP 118/70 | HR 82 | Temp 98.7°F | Resp 18 | Ht 67.0 in | Wt 353.8 lb

## 2017-06-24 DIAGNOSIS — G5603 Carpal tunnel syndrome, bilateral upper limbs: Secondary | ICD-10-CM

## 2017-06-24 DIAGNOSIS — F4322 Adjustment disorder with anxiety: Secondary | ICD-10-CM | POA: Diagnosis not present

## 2017-06-24 MED ORDER — MELOXICAM 7.5 MG PO TABS: 7.5000 mg | ORAL_TABLET | Freq: Every day | ORAL | 0 refills | Status: DC

## 2017-06-24 NOTE — Patient Instructions (Signed)
Please begin meloxicam.  Wear wrist braces at night and as much as possible during the day.

## 2017-06-24 NOTE — Progress Notes (Signed)
Subjective:    Patient ID: Margaret Golden, female    DOB: 01/27/1991, 27 y.o.   MRN: [id number]  HPI  Pt is a 27 yr old female who presents today with chief complaint of hand pain/numbness.  Reports that she has bilateral tingling in both hands, L>R.  She is right hand dominant.    Review of Systems    see HPI  Past Medical History:  Diagnosis Date  . Depression   . Fatty liver   . History of chicken pox   . History of kidney stones   . Thyroid disease    hypothyroid     Social History   Socioeconomic History  . Marital status: Single    Spouse name: Not on file  . Number of children: Not on file  . Years of education: 6612  . Highest education level: Not on file  Occupational History  . Not on file  Social Needs  . Financial resource strain: Not on file  . Food insecurity:    Worry: Not on file    Inability: Not on file  . Transportation needs:    Medical: Not on file    Non-medical: Not on file  Tobacco Use  . Smoking status: Never Smoker  . Smokeless tobacco: Never Used  Substance and Sexual Activity  . Alcohol use: No    Alcohol/week: 0.0 oz  . Drug use: No  . Sexual activity: Never  Lifestyle  . Physical activity:    Days per week: Not on file    Minutes per session: Not on file  . Stress: Not on file  Relationships  . Social connections:    Talks on phone: Not on file    Gets together: Not on file    Attends religious service: Not on file    Active member of club or organization: Not on file    Attends meetings of clubs or organizations: Not on file    Relationship status: Not on file  . Intimate partner violence:    Fear of current or ex partner: Not on file    Emotionally abused: Not on file    Physically abused: Not on file    Forced sexual activity: Not on file  Other Topics Concern  . Not on file  Social History Narrative   Unemployed   Lives with Dad   Seeking work   Only child   Dog 3   Lives in a one story house with father,  has a cat-12/26/16-sjb    Past Surgical History:  Procedure Laterality Date  . KNEE ARTHROSCOPY Right 2007  . TONSILLECTOMY  2010    Family History  Problem Relation Age of Onset  . Cancer Paternal Aunt        colon  . Cancer Paternal Uncle        "small cell cancer"  . Diabetes Maternal Grandfather   . Arthritis Maternal Grandfather   . Arthritis Maternal Grandmother   . Breast cancer Maternal Grandmother   . Arthritis Paternal Grandmother   . Arthritis Paternal Grandfather     Allergies  Allergen Reactions  . Augmentin [Amoxicillin-Pot Clavulanate]     hives    Current Outpatient Medications on File Prior to Visit  Medication Sig Dispense Refill  . albuterol (PROVENTIL HFA;VENTOLIN HFA) 108 (90 Base) MCG/ACT inhaler Inhale 2 puffs into the lungs every 6 (six) hours as needed for wheezing or shortness of breath. 1 Inhaler 0  . Ascorbic Acid (VITAMIN C) 1000  MG tablet Take 1,000 mg by mouth daily.    . CONCERTA 36 MG CR tablet   0  . Ferrous Sulfate (IRON) 28 MG TABS Take 2 tablets by mouth daily.    . fluticasone (FLONASE) 50 MCG/ACT nasal spray Place 2 sprays into both nostrils daily. 16 g 1  . gabapentin (NEURONTIN) 300 MG capsule Take 600 mg by mouth at bedtime.  0  . Gabapentin Enacarbil 600 MG TBCR Take by mouth.    . lamoTRIgine (LAMICTAL) 150 MG tablet Take 1 tablet by mouth daily.  0  . levocetirizine (XYZAL) 5 MG tablet Take 1 tablet (5 mg total) by mouth every evening. 30 tablet 3  . levothyroxine (SYNTHROID, LEVOTHROID) 150 MCG tablet Take 1 tablet (150 mcg total) by mouth daily. 30 tablet 3  . meloxicam (MOBIC) 7.5 MG tablet Take 1 tablet (7.5 mg total) by mouth daily. 14 tablet 0  . methocarbamol (ROBAXIN) 500 MG tablet Take 1 tablet (500 mg total) by mouth every 8 (eight) hours as needed for muscle spasms. 20 tablet 0  . Multiple Vitamins-Minerals (MULTIVITAMIN WITH MINERALS) tablet Take 1 tablet by mouth daily.    . nitroGLYCERIN (NITRODUR - DOSED IN MG/24  HR) 0.2 mg/hr patch Apply 1/4th patch to affected ankle, change daily 30 patch 1  . omeprazole (PRILOSEC) 20 MG capsule Take 2 capsules (40 mg total) by mouth daily. 60 capsule 5  . ondansetron (ZOFRAN ODT) 4 MG disintegrating tablet Take 1 tablet (4 mg total) by mouth every 8 (eight) hours as needed for nausea or vomiting. 20 tablet 0  . sertraline (ZOLOFT) 100 MG tablet Take 1 1/2 tablets once a day.  0  . SUMAtriptan (IMITREX) 50 MG tablet Take 1 tablet (50 mg total) by mouth every 2 (two) hours as needed for migraine. May repeat in 2 hours if headache persists or recurs. 10 tablet 0  . topiramate (TOPAMAX) 50 MG tablet Take 1 tablet (50 mg total) by mouth at bedtime. 30 tablet 5  . traZODone (DESYREL) 100 MG tablet Take 100 mg by mouth at bedtime as needed.  0   No current facility-administered medications on file prior to visit.     BP 118/70 (BP Location: Right Arm) Comment (Cuff Size): thigh  Pulse 82   Temp 98.7 F (37.1 C) (Oral)   Resp 18   Ht 5\' 7"  (1.702 m)   Wt (!) 353 lb 12.8 oz (160.5 kg)   LMP 06/10/2017   SpO2 99%   BMI 55.41 kg/m    Objective:   Physical Exam  Constitutional: She is oriented to person, place, and time. She appears well-developed and well-nourished.  HENT:  Head: Normocephalic and atraumatic.  Eyes: No scleral icterus.  Cardiovascular: Normal rate, regular rhythm and normal heart sounds.  No murmur heard. Pulmonary/Chest: Effort normal and breath sounds normal. No respiratory distress. She has no wheezes.  Musculoskeletal: She exhibits no edema.  + phalans left, + tinels bilateral  Neurological: She is alert and oriented to person, place, and time.  Skin: Skin is warm and dry.  Psychiatric: She has a normal mood and affect. Her behavior is normal. Judgment and thought content normal.          Assessment & Plan:  Carpal tunnel syndrome-New. rx with meloxicam advised bilateral wrist splints. The splints we have in stock did not fit her well  so I have advised her to check at [facility] or a hospital supply for larger wrist splints.  Plan follow  up in 1 month, is no improvement plan referral to orthopedics.

## 2017-07-02 ENCOUNTER — Encounter: Payer: Self-pay | Admitting: Family

## 2017-07-03 DIAGNOSIS — F4322 Adjustment disorder with anxiety: Secondary | ICD-10-CM | POA: Diagnosis not present

## 2017-07-05 DIAGNOSIS — F331 Major depressive disorder, recurrent, moderate: Secondary | ICD-10-CM | POA: Diagnosis not present

## 2017-07-09 DIAGNOSIS — F4322 Adjustment disorder with anxiety: Secondary | ICD-10-CM | POA: Diagnosis not present

## 2017-07-16 DIAGNOSIS — F4322 Adjustment disorder with anxiety: Secondary | ICD-10-CM | POA: Diagnosis not present

## 2017-07-18 ENCOUNTER — Encounter: Payer: Self-pay | Admitting: *Deleted

## 2017-07-22 DIAGNOSIS — F4322 Adjustment disorder with anxiety: Secondary | ICD-10-CM | POA: Diagnosis not present

## 2017-07-24 ENCOUNTER — Ambulatory Visit: Payer: BLUE CROSS/BLUE SHIELD | Admitting: Family

## 2017-07-24 ENCOUNTER — Encounter: Payer: Self-pay | Admitting: Family

## 2017-07-24 VITALS — BP 110/73 | HR 101 | Temp 98.4°F | Resp 16 | Ht 67.0 in | Wt 350.4 lb

## 2017-07-24 DIAGNOSIS — G5600 Carpal tunnel syndrome, unspecified upper limb: Secondary | ICD-10-CM

## 2017-07-24 DIAGNOSIS — F329 Major depressive disorder, single episode, unspecified: Secondary | ICD-10-CM

## 2017-07-24 DIAGNOSIS — R400 Somnolence: Secondary | ICD-10-CM

## 2017-07-24 DIAGNOSIS — F32A Depression, unspecified: Secondary | ICD-10-CM

## 2017-07-24 NOTE — Patient Instructions (Signed)
Please complete lab work prior to leaving. You will be contacted about your home sleep study.

## 2017-07-24 NOTE — Progress Notes (Signed)
Subjective:    Patient ID: Margaret Golden, female    DOB: 1990-07-10, 27 y.o.   MRN: [id number]  HPI  Patient is a 27 yr old female who presents today for follow up.  1) carpal tunnel- last visit we gave her a trial of meloxicam. She states that she did not take meloxicam.Saw a chiropractor and thinks that this helped.   Sleepiness- feels tired all day.  Unsure if she snores. Wt Readings from Last 3 Encounters:  07/24/17 (!) 350 lb 6.4 oz (158.9 kg)  06/24/17 (!) 353 lb 12.8 oz (160.5 kg)  05/23/17 (!) 347 lb (157.4 kg)    Depression- she has a life coach and a psychiatrist. Reports "It has been a struggle but it is improving."   Review of Systems See HPI  Past Medical History:  Diagnosis Date  . Depression   . Fatty liver   . History of chicken pox   . History of kidney stones   . Thyroid disease    hypothyroid     Social History   Socioeconomic History  . Marital status: Single    Spouse name: Not on file  . Number of children: Not on file  . Years of education: 25  . Highest education level: Not on file  Occupational History  . Not on file  Social Needs  . Financial resource strain: Not on file  . Food insecurity:    Worry: Not on file    Inability: Not on file  . Transportation needs:    Medical: Not on file    Non-medical: Not on file  Tobacco Use  . Smoking status: Never Smoker  . Smokeless tobacco: Never Used  Substance and Sexual Activity  . Alcohol use: No    Alcohol/week: 0.0 oz  . Drug use: No  . Sexual activity: Never  Lifestyle  . Physical activity:    Days per week: Not on file    Minutes per session: Not on file  . Stress: Not on file  Relationships  . Social connections:    Talks on phone: Not on file    Gets together: Not on file    Attends religious service: Not on file    Active member of club or organization: Not on file    Attends meetings of clubs or organizations: Not on file    Relationship status: Not on file  .  Intimate partner violence:    Fear of current or ex partner: Not on file    Emotionally abused: Not on file    Physically abused: Not on file    Forced sexual activity: Not on file  Other Topics Concern  . Not on file  Social History Narrative   Unemployed   Lives with Dad   Seeking work   Only child   Dog 3   Lives in a one story house with father, has a cat-12/26/16-sjb    Past Surgical History:  Procedure Laterality Date  . KNEE ARTHROSCOPY Right 2007  . TONSILLECTOMY  2010    Family History  Problem Relation Age of Onset  . Cancer Paternal Aunt        colon  . Cancer Paternal Uncle        "small cell cancer"  . Diabetes Maternal Grandfather   . Arthritis Maternal Grandfather   . Arthritis Maternal Grandmother   . Breast cancer Maternal Grandmother   . Arthritis Paternal Grandmother   . Arthritis Paternal Grandfather  Allergies  Allergen Reactions  . Augmentin [Amoxicillin-Pot Clavulanate]     hives    Current Outpatient Medications on File Prior to Visit  Medication Sig Dispense Refill  . CONCERTA 36 MG CR tablet   0  . Ferrous Sulfate (IRON) 28 MG TABS Take 2 tablets by mouth daily.    Marland Kitchen. gabapentin (NEURONTIN) 300 MG capsule Take 600 mg by mouth at bedtime.  0  . Gabapentin Enacarbil 600 MG TBCR Take by mouth.    . lamoTRIgine (LAMICTAL) 150 MG tablet Take 1 tablet by mouth daily.  0  . levocetirizine (XYZAL) 5 MG tablet Take 1 tablet (5 mg total) by mouth every evening. 30 tablet 3  . levothyroxine (SYNTHROID, LEVOTHROID) 150 MCG tablet Take 1 tablet (150 mcg total) by mouth daily. 30 tablet 3  . Multiple Vitamins-Minerals (MULTIVITAMIN WITH MINERALS) tablet Take 1 tablet by mouth daily.    Marland Kitchen. omeprazole (PRILOSEC) 20 MG capsule Take 2 capsules (40 mg total) by mouth daily. 60 capsule 5  . sertraline (ZOLOFT) 100 MG tablet Take 1 1/2 tablets once a day.  0  . topiramate (TOPAMAX) 50 MG tablet Take 1 tablet (50 mg total) by mouth at bedtime. 30 tablet 5  .  traZODone (DESYREL) 100 MG tablet Take 100 mg by mouth at bedtime as needed.  0   No current facility-administered medications on file prior to visit.     BP 110/73 (BP Location: Right Arm, Patient Position: Sitting, Cuff Size: Large)   Pulse (!) 101   Temp 98.4 F (36.9 C) (Oral)   Resp 16   Ht 5\' 7"  (1.702 m)   Wt (!) 350 lb 6.4 oz (158.9 kg)   LMP 06/21/2017   SpO2 97%   BMI 54.88 kg/m       Objective:   Physical Exam  Constitutional: She is oriented to person, place, and time. She appears well-developed and well-nourished.  Cardiovascular: Normal rate, regular rhythm and normal heart sounds.  No murmur heard. Pulmonary/Chest: Effort normal and breath sounds normal. No respiratory distress. She has no wheezes.  Neurological: She is alert and oriented to person, place, and time.  Skin: Skin is warm and dry.  Psychiatric: She has a normal mood and affect. Her behavior is normal. Judgment and thought content normal.          Assessment & Plan:  Carpal tunnel- improving. Monitor  Depression- stable, management per psychiatry.  Somnolence- check cbc, TSH, CMEt, refer for sleep study, high risk for OSA due to BMI.

## 2017-07-26 ENCOUNTER — Other Ambulatory Visit (INDEPENDENT_AMBULATORY_CARE_PROVIDER_SITE_OTHER): Payer: BLUE CROSS/BLUE SHIELD

## 2017-07-26 DIAGNOSIS — R400 Somnolence: Secondary | ICD-10-CM

## 2017-07-26 LAB — COMPREHENSIVE METABOLIC PANEL
ALBUMIN: 4.5 g/dL (ref 3.5–5.2)
ALT: 21 U/L (ref 0–35)
AST: 33 U/L (ref 0–37)
Alkaline Phosphatase: 82 U/L (ref 39–117)
BUN: 10 mg/dL (ref 6–23)
CO2: 23 mEq/L (ref 19–32)
Calcium: 9 mg/dL (ref 8.4–10.5)
Chloride: 103 mEq/L (ref 96–112)
Creatinine, Ser: 0.79 mg/dL (ref 0.40–1.20)
GFR: 92.53 mL/min (ref >60.00)
Glucose, Bld: 101 mg/dL — ABNORMAL HIGH (ref 70–99)
POTASSIUM: 3.4 meq/L — AB (ref 3.5–5.1)
Sodium: 140 mEq/L (ref 135–145)
TOTAL PROTEIN: 8.1 g/dL (ref 6.0–8.3)
Total Bilirubin: 0.5 mg/dL (ref 0.2–1.2)

## 2017-07-26 LAB — CBC
HEMATOCRIT: 44 % (ref 36.0–46.0)
Hemoglobin: 14.6 g/dL (ref 12.0–15.0)
MCHC: 33.3 g/dL (ref 30.0–36.0)
MCV: 81.5 fl (ref 78.0–100.0)
PLATELETS: 234 10*3/uL (ref 150.0–400.0)
RBC: 5.4 Mil/uL — AB (ref 3.87–5.11)
RDW: 14.4 % (ref 11.5–15.5)
WBC: 10.9 10*3/uL — ABNORMAL HIGH (ref 4.0–10.5)

## 2017-07-26 LAB — TSH: TSH: 11.3 u[IU]/mL — ABNORMAL HIGH (ref 0.35–4.50)

## 2017-07-28 ENCOUNTER — Telehealth: Payer: Self-pay | Admitting: Family

## 2017-07-28 DIAGNOSIS — D72829 Elevated white blood cell count, unspecified: Secondary | ICD-10-CM

## 2017-07-28 DIAGNOSIS — E039 Hypothyroidism, unspecified: Secondary | ICD-10-CM

## 2017-07-28 DIAGNOSIS — E876 Hypokalemia: Secondary | ICD-10-CM

## 2017-07-28 MED ORDER — LEVOTHYROXINE SODIUM 175 MCG PO TABS: 175.0000 ug | ORAL_TABLET | Freq: Every day | ORAL | 2 refills | Status: DC

## 2017-07-28 NOTE — Telephone Encounter (Signed)
Potassium is a little low. Increase potassium rich foods in diet.  Repeat bmet in 1 week.  It still looks like she needs more synthroid.  Make sure to take in AM on empty stomach and don't eat for 30 minutes after.  Don't take with other medicines.  Increase synthroid to 175 mcg.    White blood cell count was a touch elevated.  She could be getting over a virus which is causing fatigue as well as her hypothyroid. Please repeat bmet and cbc in 1 week. Dx hypokalemia and leukocytosis. Repeat tsh in 6 weeks, dx hypothyroid.    High potassium content foods  Highest content (>25 meq/100 g) High content (>6.2 meq/100 g)   Vegetables   Spinach   Tomatoes   Broccoli   Winter squash   Beets   Carrots   Cauliflower   Potatoes   Fruits   Bananas  Dried figs Cantaloupe  Molasses Kiwis  Seaweed Oranges  Very high content (>12.5 meq/100 g) Mangos  Dried fruits (dates, prunes) Meats  Nuts Ground beef  Avocados Steak  Bran cereals Pork  Wheat germ Veal  Lima beans Randa Lynn

## 2017-07-29 NOTE — Telephone Encounter (Signed)
Attempted to reach pt and left message to return my call. 

## 2017-07-30 DIAGNOSIS — F4322 Adjustment disorder with anxiety: Secondary | ICD-10-CM | POA: Diagnosis not present

## 2017-07-30 NOTE — Telephone Encounter (Signed)
Notified pt and she voices understanding. Lab appts scheduled for 5/7 and 09/10/17 at 2pm.

## 2017-08-05 DIAGNOSIS — F4322 Adjustment disorder with anxiety: Secondary | ICD-10-CM | POA: Diagnosis not present

## 2017-08-05 DIAGNOSIS — F331 Major depressive disorder, recurrent, moderate: Secondary | ICD-10-CM | POA: Diagnosis not present

## 2017-08-06 ENCOUNTER — Other Ambulatory Visit (INDEPENDENT_AMBULATORY_CARE_PROVIDER_SITE_OTHER): Payer: BLUE CROSS/BLUE SHIELD

## 2017-08-06 DIAGNOSIS — D72829 Elevated white blood cell count, unspecified: Secondary | ICD-10-CM | POA: Diagnosis not present

## 2017-08-06 DIAGNOSIS — E876 Hypokalemia: Secondary | ICD-10-CM | POA: Diagnosis not present

## 2017-08-06 LAB — CBC WITH DIFFERENTIAL/PLATELET
BASOS PCT: 0.4 % (ref 0.0–3.0)
Basophils Absolute: 0 10*3/uL (ref 0.0–0.1)
EOS ABS: 0.2 10*3/uL (ref 0.0–0.7)
Eosinophils Relative: 2.4 % (ref 0.0–5.0)
HEMATOCRIT: 41.7 % (ref 36.0–46.0)
Hemoglobin: 13.7 g/dL (ref 12.0–15.0)
LYMPHS ABS: 2.3 10*3/uL (ref 0.7–4.0)
LYMPHS PCT: 28.9 % (ref 12.0–46.0)
MCHC: 32.9 g/dL (ref 30.0–36.0)
MCV: 80.6 fl (ref 78.0–100.0)
Monocytes Absolute: 0.4 10*3/uL (ref 0.1–1.0)
Monocytes Relative: 5.5 % (ref 3.0–12.0)
NEUTROS ABS: 4.9 10*3/uL (ref 1.4–7.7)
Neutrophils Relative %: 62.8 % (ref 43.0–77.0)
Platelets: 201 10*3/uL (ref 150.0–400.0)
RBC: 5.17 Mil/uL — ABNORMAL HIGH (ref 3.87–5.11)
RDW: 14 % (ref 11.5–15.5)
WBC: 7.8 10*3/uL (ref 4.0–10.5)

## 2017-08-06 LAB — BASIC METABOLIC PANEL
BUN: 9 mg/dL (ref 6–23)
CHLORIDE: 105 meq/L (ref 96–112)
CO2: 27 meq/L (ref 19–32)
CREATININE: 0.64 mg/dL (ref 0.40–1.20)
Calcium: 8.7 mg/dL (ref 8.4–10.5)
GFR: 117.95 mL/min (ref >60.00)
GLUCOSE: 100 mg/dL — AB (ref 70–99)
Potassium: 3.6 mEq/L (ref 3.5–5.1)
Sodium: 139 mEq/L (ref 135–145)

## 2017-08-13 DIAGNOSIS — F4322 Adjustment disorder with anxiety: Secondary | ICD-10-CM | POA: Diagnosis not present

## 2017-08-20 DIAGNOSIS — F4322 Adjustment disorder with anxiety: Secondary | ICD-10-CM | POA: Diagnosis not present

## 2017-08-27 DIAGNOSIS — F4322 Adjustment disorder with anxiety: Secondary | ICD-10-CM | POA: Diagnosis not present

## 2017-09-03 DIAGNOSIS — F4322 Adjustment disorder with anxiety: Secondary | ICD-10-CM | POA: Diagnosis not present

## 2017-09-09 ENCOUNTER — Telehealth: Payer: Self-pay | Admitting: Family

## 2017-09-09 NOTE — Telephone Encounter (Signed)
-----   Message from Lilian KapurAnita S Walker sent at 09/09/2017  2:31 PM EDT ----- Regarding: HST I have tried to contact this patient 3 different times to schedule the home sleep study. I have left voice mails on 08/12/2017. 08/21/2017, and 08/30/2017 and to this day I have not received a return phone call from her  Thanks, Synetta Failnita

## 2017-09-10 ENCOUNTER — Other Ambulatory Visit (INDEPENDENT_AMBULATORY_CARE_PROVIDER_SITE_OTHER): Payer: BLUE CROSS/BLUE SHIELD

## 2017-09-10 DIAGNOSIS — E039 Hypothyroidism, unspecified: Secondary | ICD-10-CM | POA: Diagnosis not present

## 2017-09-10 DIAGNOSIS — F4322 Adjustment disorder with anxiety: Secondary | ICD-10-CM | POA: Diagnosis not present

## 2017-09-11 LAB — TSH: TSH: 3 u[IU]/mL (ref 0.35–4.50)

## 2017-09-17 DIAGNOSIS — F4322 Adjustment disorder with anxiety: Secondary | ICD-10-CM | POA: Diagnosis not present

## 2017-09-18 DIAGNOSIS — F331 Major depressive disorder, recurrent, moderate: Secondary | ICD-10-CM | POA: Diagnosis not present

## 2017-10-01 DIAGNOSIS — F4322 Adjustment disorder with anxiety: Secondary | ICD-10-CM | POA: Diagnosis not present

## 2017-10-02 DIAGNOSIS — F4322 Adjustment disorder with anxiety: Secondary | ICD-10-CM | POA: Diagnosis not present

## 2017-10-07 DIAGNOSIS — F4322 Adjustment disorder with anxiety: Secondary | ICD-10-CM | POA: Diagnosis not present

## 2017-10-08 DIAGNOSIS — F4322 Adjustment disorder with anxiety: Secondary | ICD-10-CM | POA: Diagnosis not present

## 2017-10-14 DIAGNOSIS — F4322 Adjustment disorder with anxiety: Secondary | ICD-10-CM | POA: Diagnosis not present

## 2017-10-15 ENCOUNTER — Ambulatory Visit: Payer: BLUE CROSS/BLUE SHIELD | Admitting: Neurology

## 2017-10-21 DIAGNOSIS — F4322 Adjustment disorder with anxiety: Secondary | ICD-10-CM | POA: Diagnosis not present

## 2017-10-24 DIAGNOSIS — F4322 Adjustment disorder with anxiety: Secondary | ICD-10-CM | POA: Diagnosis not present

## 2017-10-28 DIAGNOSIS — F4322 Adjustment disorder with anxiety: Secondary | ICD-10-CM | POA: Diagnosis not present

## 2017-10-31 DIAGNOSIS — F4322 Adjustment disorder with anxiety: Secondary | ICD-10-CM | POA: Diagnosis not present

## 2017-11-05 DIAGNOSIS — F4322 Adjustment disorder with anxiety: Secondary | ICD-10-CM | POA: Diagnosis not present

## 2017-11-12 DIAGNOSIS — F4322 Adjustment disorder with anxiety: Secondary | ICD-10-CM | POA: Diagnosis not present

## 2017-11-18 DIAGNOSIS — F4322 Adjustment disorder with anxiety: Secondary | ICD-10-CM | POA: Diagnosis not present

## 2017-11-19 DIAGNOSIS — F331 Major depressive disorder, recurrent, moderate: Secondary | ICD-10-CM | POA: Diagnosis not present

## 2017-11-25 DIAGNOSIS — F4322 Adjustment disorder with anxiety: Secondary | ICD-10-CM | POA: Diagnosis not present

## 2017-12-03 DIAGNOSIS — F4322 Adjustment disorder with anxiety: Secondary | ICD-10-CM | POA: Diagnosis not present

## 2017-12-10 DIAGNOSIS — F4322 Adjustment disorder with anxiety: Secondary | ICD-10-CM | POA: Diagnosis not present

## 2017-12-16 DIAGNOSIS — F4322 Adjustment disorder with anxiety: Secondary | ICD-10-CM | POA: Diagnosis not present

## 2017-12-23 DIAGNOSIS — F4322 Adjustment disorder with anxiety: Secondary | ICD-10-CM | POA: Diagnosis not present

## 2017-12-30 DIAGNOSIS — F4322 Adjustment disorder with anxiety: Secondary | ICD-10-CM | POA: Diagnosis not present

## 2017-12-31 DIAGNOSIS — F4322 Adjustment disorder with anxiety: Secondary | ICD-10-CM | POA: Diagnosis not present

## 2018-01-06 DIAGNOSIS — F4322 Adjustment disorder with anxiety: Secondary | ICD-10-CM | POA: Diagnosis not present

## 2018-01-14 DIAGNOSIS — F4322 Adjustment disorder with anxiety: Secondary | ICD-10-CM | POA: Diagnosis not present

## 2018-01-20 DIAGNOSIS — F4322 Adjustment disorder with anxiety: Secondary | ICD-10-CM | POA: Diagnosis not present

## 2018-01-21 DIAGNOSIS — F4322 Adjustment disorder with anxiety: Secondary | ICD-10-CM | POA: Diagnosis not present

## 2018-01-22 DIAGNOSIS — F4322 Adjustment disorder with anxiety: Secondary | ICD-10-CM | POA: Diagnosis not present

## 2018-01-28 DIAGNOSIS — F4322 Adjustment disorder with anxiety: Secondary | ICD-10-CM | POA: Diagnosis not present

## 2018-01-30 ENCOUNTER — Emergency Department (HOSPITAL_BASED_OUTPATIENT_CLINIC_OR_DEPARTMENT_OTHER): Payer: BLUE CROSS/BLUE SHIELD

## 2018-01-30 ENCOUNTER — Emergency Department (HOSPITAL_BASED_OUTPATIENT_CLINIC_OR_DEPARTMENT_OTHER)
Admission: EM | Admit: 2018-01-30 | Discharge: 2018-01-31 | Disposition: A | Discharge status: Home / Self Care | Payer: BLUE CROSS/BLUE SHIELD | Attending: Emergency Medicine | Admitting: Emergency Medicine

## 2018-01-30 ENCOUNTER — Encounter (HOSPITAL_BASED_OUTPATIENT_CLINIC_OR_DEPARTMENT_OTHER): Payer: Self-pay | Admitting: Emergency Medicine

## 2018-01-30 ENCOUNTER — Other Ambulatory Visit: Payer: Self-pay

## 2018-01-30 DIAGNOSIS — E039 Hypothyroidism, unspecified: Secondary | ICD-10-CM | POA: Insufficient documentation

## 2018-01-30 DIAGNOSIS — F909 Attention-deficit hyperactivity disorder, unspecified type: Secondary | ICD-10-CM | POA: Diagnosis not present

## 2018-01-30 DIAGNOSIS — Z79899 Other long term (current) drug therapy: Secondary | ICD-10-CM | POA: Diagnosis not present

## 2018-01-30 DIAGNOSIS — M5432 Sciatica, left side: Secondary | ICD-10-CM | POA: Diagnosis not present

## 2018-01-30 DIAGNOSIS — M545 Low back pain: Secondary | ICD-10-CM | POA: Diagnosis not present

## 2018-01-30 DIAGNOSIS — E669 Obesity, unspecified: Secondary | ICD-10-CM | POA: Insufficient documentation

## 2018-01-30 DIAGNOSIS — M5441 Lumbago with sciatica, right side: Secondary | ICD-10-CM | POA: Diagnosis not present

## 2018-01-30 DIAGNOSIS — M5431 Sciatica, right side: Secondary | ICD-10-CM | POA: Diagnosis not present

## 2018-01-30 DIAGNOSIS — M5442 Lumbago with sciatica, left side: Secondary | ICD-10-CM | POA: Diagnosis not present

## 2018-01-30 HISTORY — DX: Other specified behavioral and emotional disorders with onset usually occurring in childhood and adolescence: F98.8

## 2018-01-30 HISTORY — DX: Anxiety disorder, unspecified: F41.9

## 2018-01-30 LAB — PREGNANCY, URINE: Preg Test, Ur: NEGATIVE

## 2018-01-30 MED ORDER — OXYCODONE-ACETAMINOPHEN 5-325 MG PO TABS
2.0000 | ORAL_TABLET | Freq: Once | ORAL | Status: AC
  Administered 2018-01-30: 2 via ORAL
  Filled 2018-01-30: qty 2

## 2018-01-30 NOTE — ED Triage Notes (Signed)
Lower back pain at 1800 today after walking. States moving boxes all day. Pt 2 of dad's vicodin around 1900 without relief. Pt ambulatory.

## 2018-01-30 NOTE — ED Provider Notes (Addendum)
[facility] Provider Note: Lowella Dell, MD, FACEP  CSN: [id number] MRN: [id number] ARRIVAL: 01/30/18 at 2234 ROOM: MH02/MH02   CHIEF COMPLAINT  Back Pain   HISTORY OF PRESENT ILLNESS  01/30/18 11:01 PM Margaret Golden is a 27 y.o. female with a history of obesity.  She was lifting boxes in the process of moving this evening about 6 PM and felt a pop in her lower back.  She has subsequently had severe, sharp pain in her bilateral paralumbar regions radiating down the backs of both legs.  Pain is worse with movement.  She denies saddle anesthesia, bowel or bladder changes or numbness or weakness in her lower extremities although she is having difficulty moving her legs due to pain.  She took 2 of her father's hydrocodone tablets earlier without adequate relief.   Past Medical History:  Diagnosis Date  . ADD (attention deficit disorder)   . Anxiety   . Depression   . Fatty liver   . History of chicken pox   . History of kidney stones   . Thyroid disease    hypothyroid    Past Surgical History:  Procedure Laterality Date  . KNEE ARTHROSCOPY Right 2007  . TONSILLECTOMY  2010    Family History  Problem Relation Age of Onset  . Cancer Paternal Aunt        colon  . Cancer Paternal Uncle        "small cell cancer"  . Diabetes Maternal Grandfather   . Arthritis Maternal Grandfather   . Arthritis Maternal Grandmother   . Breast cancer Maternal Grandmother   . Arthritis Paternal Grandmother   . Arthritis Paternal Grandfather     Social History   Tobacco Use  . Smoking status: Never Smoker  . Smokeless tobacco: Never Used  Substance Use Topics  . Alcohol use: Never    Alcohol/week: 0.0 standard drinks    Frequency: Never  . Drug use: No    Prior to Admission medications   Medication Sig Start Date End Date Taking? Authorizing Provider  CONCERTA 36 MG CR tablet  04/19/17   [provider]  Ferrous Sulfate (IRON) 28 MG TABS Take 2 tablets  by mouth daily.    [provider]  gabapentin (NEURONTIN) 300 MG capsule Take 600 mg by mouth at bedtime. 10/13/16   [provider]  Gabapentin Enacarbil 600 MG TBCR Take by mouth.    [provider]  lamoTRIgine (LAMICTAL) 150 MG tablet Take 1 tablet by mouth daily. 08/17/16   [provider]  levocetirizine (XYZAL) 5 MG tablet Take 1 tablet (5 mg total) by mouth every evening. 07/06/16   Saguier, Ramon Dredge, PA-C  levothyroxine (SYNTHROID) 175 MCG tablet Take 1 tablet (175 mcg total) by mouth daily before breakfast. 07/28/17   [name], NP  Multiple Vitamins-Minerals (MULTIVITAMIN WITH MINERALS) tablet Take 1 tablet by mouth daily. 02/02/15   [name], NP  omeprazole (PRILOSEC) 20 MG capsule Take 2 capsules (40 mg total) by mouth daily. 02/20/17   Saguier, Ramon Dredge, PA-C  sertraline (ZOLOFT) 100 MG tablet Take 1 1/2 tablets once a day. 04/19/17   [provider]  topiramate (TOPAMAX) 50 MG tablet Take 1 tablet (50 mg total) by mouth at bedtime. 04/16/17   Drema Dallas, DO  traZODone (DESYREL) 100 MG tablet Take 100 mg by mouth at bedtime as needed. 08/17/16   [provider]    Allergies Augmentin [amoxicillin-pot clavulanate]   REVIEW OF SYSTEMS  Negative except as noted here or in the History of Present Illness.   PHYSICAL EXAMINATION  Initial Vital Signs Blood pressure (!) 127/97, pulse (!) 108, temperature 98.9 F (37.2 C), temperature source Oral, resp. rate 20, SpO2 95 %.  Examination General: Well-developed, obese female in no acute distress; appearance consistent with age of record HENT: normocephalic; atraumatic Eyes: pupils equal, round and reactive to light; extraocular muscles intact Neck: supple Heart: regular rate and rhythm Lungs: Normal respiratory effort and excursion Abdomen: soft; obese; bowel sounds present Back: Mild paralumbar tenderness; positive straight leg raise bilaterally at about 10  degrees Extremities: No deformity; full range of motion; pulses normal Neurologic: Awake, alert and oriented; motor function intact in all extremities and symmetric but examination limited in lower extremities due to pain; sensation intact and symmetric in lower extremities; no facial droop Skin: Warm and dry Psychiatric: Normal mood and affect   RESULTS  Summary of this visit's results, reviewed by myself:   EKG Interpretation  Date/Time:    Ventricular Rate:    PR Interval:    QRS Duration:   QT Interval:    QTC Calculation:   R Axis:     Text Interpretation:        Laboratory Studies: Results for orders placed or performed during the hospital encounter of 01/30/18 (from the past 24 hour(s))  Pregnancy, urine     Status: None   Collection Time: 01/30/18 11:28 PM  Result Value Ref Range   Preg Test, Ur NEGATIVE NEGATIVE   Imaging Studies: Dg Lumbar Spine Complete  Result Date: 01/31/2018 CLINICAL DATA:  Left-sided low back pain after lifting injury today. EXAM: LUMBAR SPINE - COMPLETE 4+ VIEW COMPARISON:  None. FINDINGS: Vertebral body alignment and heights are normal. There is minimal disc space narrowing at the L4-5 level. Mild facet arthropathy is present. No compression fracture or subluxation. IMPRESSION: No acute findings. Suggestion mild disc disease at the L4-5 level. Mild facet arthropathy. Electronically Signed   By: Elberta Fortis M.D.   On: 01/31/2018 01:35    ED COURSE and MDM  Nursing notes and initial vitals signs, including pulse oximetry, reviewed.  Vitals:   01/30/18 2240 01/30/18 2327 01/31/18 0053  BP: (!) 127/97  114/72  Pulse: (!) 108  80  Resp: 20  18  Temp: 98.9 F (37.2 C)  98.5 F (36.9 C)  TempSrc: Oral  Oral  SpO2: 95%  100%  Weight:  (!) 149.7 kg   Height:  5\' 6"  (1.676 m)    1:51 AM Patient's pain significantly improved after 2 Percocet tablets. Examination consistent with acute bilateral sciatica.  PROCEDURES    ED DIAGNOSES      ICD-10-CM   1. Bilateral sciatica M54.31    M54.32        Camila Maita, Jonny Ruiz, MD 01/31/18 0149    Paula Libra, MD 01/31/18 0151

## 2018-01-31 DIAGNOSIS — M545 Low back pain: Secondary | ICD-10-CM | POA: Diagnosis not present

## 2018-01-31 MED ORDER — OXYCODONE-ACETAMINOPHEN 5-325 MG PO TABS: 1.0000 | ORAL_TABLET | ORAL | 0 refills | Status: DC | PRN

## 2018-02-03 DIAGNOSIS — F4322 Adjustment disorder with anxiety: Secondary | ICD-10-CM | POA: Diagnosis not present

## 2018-02-10 DIAGNOSIS — F4322 Adjustment disorder with anxiety: Secondary | ICD-10-CM | POA: Diagnosis not present

## 2018-02-11 ENCOUNTER — Ambulatory Visit: Payer: Self-pay | Admitting: Psychiatry

## 2018-02-19 ENCOUNTER — Ambulatory Visit: Payer: BLUE CROSS/BLUE SHIELD | Admitting: Family

## 2018-02-19 ENCOUNTER — Encounter: Payer: Self-pay | Admitting: Family

## 2018-02-19 VITALS — BP 114/81 | HR 107 | Temp 98.6°F | Resp 16 | Ht 67.0 in | Wt 356.0 lb

## 2018-02-19 DIAGNOSIS — M5430 Sciatica, unspecified side: Secondary | ICD-10-CM

## 2018-02-19 DIAGNOSIS — E876 Hypokalemia: Secondary | ICD-10-CM

## 2018-02-19 DIAGNOSIS — D509 Iron deficiency anemia, unspecified: Secondary | ICD-10-CM | POA: Diagnosis not present

## 2018-02-19 DIAGNOSIS — F32A Depression, unspecified: Secondary | ICD-10-CM

## 2018-02-19 DIAGNOSIS — E039 Hypothyroidism, unspecified: Secondary | ICD-10-CM

## 2018-02-19 DIAGNOSIS — F329 Major depressive disorder, single episode, unspecified: Secondary | ICD-10-CM

## 2018-02-19 LAB — BASIC METABOLIC PANEL
BUN: 10 mg/dL (ref 6–23)
CALCIUM: 8.4 mg/dL (ref 8.4–10.5)
CHLORIDE: 104 meq/L (ref 96–112)
CO2: 25 mEq/L (ref 19–32)
CREATININE: 0.71 mg/dL (ref 0.40–1.20)
GFR: 104.22 mL/min (ref >60.00)
Glucose, Bld: 111 mg/dL — ABNORMAL HIGH (ref 70–99)
Potassium: 3.6 mEq/L (ref 3.5–5.1)
SODIUM: 138 meq/L (ref 135–145)

## 2018-02-19 LAB — IRON: IRON: 41 ug/dL — AB (ref 42–145)

## 2018-02-19 LAB — TSH: TSH: 26.33 u[IU]/mL — AB (ref 0.35–4.50)

## 2018-02-19 MED ORDER — MELOXICAM 7.5 MG PO TABS: 7.5000 mg | ORAL_TABLET | Freq: Every day | ORAL | 0 refills | Status: DC

## 2018-02-19 MED ORDER — OMEPRAZOLE 20 MG PO CPDR: 40.0000 mg | DELAYED_RELEASE_CAPSULE | Freq: Every day | ORAL | 5 refills | Status: DC

## 2018-02-19 MED ORDER — LEVOTHYROXINE SODIUM 175 MCG PO TABS: 175.0000 ug | ORAL_TABLET | Freq: Every day | ORAL | 5 refills | Status: DC

## 2018-02-19 NOTE — Patient Instructions (Addendum)
Please call if symptoms worsen or if not improved in 2 days. Please complete lab work prior to leaving.  You should be contacted about your referral to the weight loss clinic.

## 2018-02-19 NOTE — Progress Notes (Signed)
Subjective:    Patient ID: Margaret Golden, female    DOB: 10/12/1990, 27 y.o.   MRN: [id number]  HPI   Patient presents today for ER follow up.  ER record is reviewed. Pt presented on 01/30/18 with bilateral sciatic pain. She was treated with percocet and discharged home. Reports some soreness, better able to ambulate.     Wt Readings from Last 3 Encounters:  02/19/18 (!) 356 lb (161.5 kg)  01/30/18 (!) 330 lb (149.7 kg)  07/24/17 (!) 350 lb 6.4 oz (158.9 kg)   Depression-  Seeing Dr. Corie ChiquitoJessica Carter (psychiatry).  She has follow up next week and is also working with her life coach.   Reports some cold symptoms which began about 1 week ago. Thought it was seasonal allergies.  Has ear pressures.    Lab Results  Component Value Date   TSH 3.00 09/10/2017       Review of Systems See HPI  Past Medical History:  Diagnosis Date  . ADD (attention deficit disorder)   . Anxiety   . Depression   . Fatty liver   . History of chicken pox   . History of kidney stones   . Thyroid disease    hypothyroid     Social History   Socioeconomic History  . Marital status: Single    Spouse name: Not on file  . Number of children: Not on file  . Years of education: 3412  . Highest education level: Not on file  Occupational History  . Not on file  Social Needs  . Financial resource strain: Not on file  . Food insecurity:    Worry: Not on file    Inability: Not on file  . Transportation needs:    Medical: Not on file    Non-medical: Not on file  Tobacco Use  . Smoking status: Never Smoker  . Smokeless tobacco: Never Used  Substance and Sexual Activity  . Alcohol use: Never    Alcohol/week: 0.0 standard drinks    Frequency: Never  . Drug use: No  . Sexual activity: Never  Lifestyle  . Physical activity:    Days per week: Not on file    Minutes per session: Not on file  . Stress: Not on file  Relationships  . Social connections:    Talks on phone: Not on file    Gets  together: Not on file    Attends religious service: Not on file    Active member of club or organization: Not on file    Attends meetings of clubs or organizations: Not on file    Relationship status: Not on file  . Intimate partner violence:    Fear of current or ex partner: Not on file    Emotionally abused: Not on file    Physically abused: Not on file    Forced sexual activity: Not on file  Other Topics Concern  . Not on file  Social History Narrative   Unemployed   Lives with Dad   Seeking work   Only child   Dog 3   Lives in a one story house with father, has a cat-12/26/16-sjb    Past Surgical History:  Procedure Laterality Date  . KNEE ARTHROSCOPY Right 2007  . TONSILLECTOMY  2010    Family History  Problem Relation Age of Onset  . Cancer Paternal Aunt        colon  . Cancer Paternal Uncle        "  small cell cancer"  . Diabetes Maternal Grandfather   . Arthritis Maternal Grandfather   . Arthritis Maternal Grandmother   . Breast cancer Maternal Grandmother   . Arthritis Paternal Grandmother   . Arthritis Paternal Grandfather     Allergies  Allergen Reactions  . Augmentin [Amoxicillin-Pot Clavulanate]     hives    Current Outpatient Medications on File Prior to Visit  Medication Sig Dispense Refill  . CONCERTA 36 MG CR tablet   0  . Ferrous Sulfate (IRON) 28 MG TABS Take 2 tablets by mouth daily.    Marland Kitchen gabapentin (NEURONTIN) 300 MG capsule Take 600 mg by mouth at bedtime.  0  . Gabapentin Enacarbil 600 MG TBCR Take by mouth.    . lamoTRIgine (LAMICTAL) 150 MG tablet Take 1 tablet by mouth daily.  0  . levocetirizine (XYZAL) 5 MG tablet Take 1 tablet (5 mg total) by mouth every evening. 30 tablet 3  . Multiple Vitamins-Minerals (MULTIVITAMIN WITH MINERALS) tablet Take 1 tablet by mouth daily.    Marland Kitchen oxyCODONE-acetaminophen (PERCOCET) 5-325 MG tablet Take 1 tablet by mouth every 4 (four) hours as needed for severe pain. 20 tablet 0  . sertraline (ZOLOFT) 100  MG tablet Take 1 1/2 tablets once a day.  0  . topiramate (TOPAMAX) 50 MG tablet Take 1 tablet (50 mg total) by mouth at bedtime. 30 tablet 5  . traZODone (DESYREL) 100 MG tablet Take 100 mg by mouth at bedtime as needed.  0   No current facility-administered medications on file prior to visit.     BP 114/81 (BP Location: Right Arm, Patient Position: Sitting, Cuff Size: Large)   Pulse (!) 107   Temp 98.6 F (37 C) (Oral)   Resp 16   Ht 5\' 7"  (1.702 m)   Wt (!) 356 lb (161.5 kg)   SpO2 100%   BMI 55.76 kg/m       Objective:   Physical Exam  Constitutional: She is oriented to person, place, and time. She appears well-developed and well-nourished.  Cardiovascular: Normal rate, regular rhythm and normal heart sounds.  No murmur heard. Pulmonary/Chest: Effort normal and breath sounds normal. No respiratory distress. She has no wheezes.  Musculoskeletal: She exhibits no edema.  Neurological: She is alert and oriented to person, place, and time.  Skin: Skin is warm and dry.  Psychiatric: She has a normal mood and affect. Her behavior is normal. Judgment and thought content normal.          Assessment & Plan:  Sciatica- improving but not resolved. We discussed PT referral +/- NSAID, pt prefers to try nsaid first. Will send meloxicam.  Morbid obesity-  Discussed various options to help her- nutrition referral, referral to medical weight loss clinic, bariatric referral.  She prefers referral to medical weight loss clinic. We discussed long term health risks associated with morbid obesity.  Hypothyroid- TSH elevated but pt admits to some non-compliance with medication as she moved. Restart daily and plan to repeat TSH in 6 weeks. See phone note.  Depression- stable- following with psychiatry.   Iron deficiency- follow up iron level mildly low. Pt has not been taking iron regularly. Restart iron supplement repeat in 6 weeks, see phone note.

## 2018-02-20 ENCOUNTER — Other Ambulatory Visit: Payer: Self-pay

## 2018-02-20 DIAGNOSIS — E039 Hypothyroidism, unspecified: Secondary | ICD-10-CM

## 2018-02-20 DIAGNOSIS — D509 Iron deficiency anemia, unspecified: Secondary | ICD-10-CM

## 2018-02-21 ENCOUNTER — Encounter: Payer: Self-pay | Admitting: Family

## 2018-02-21 ENCOUNTER — Ambulatory Visit: Payer: BLUE CROSS/BLUE SHIELD | Admitting: Family

## 2018-02-21 ENCOUNTER — Telehealth: Payer: Self-pay

## 2018-02-21 VITALS — BP 119/82 | HR 96 | Temp 98.3°F | Ht 67.0 in | Wt 354.2 lb

## 2018-02-21 DIAGNOSIS — J329 Chronic sinusitis, unspecified: Secondary | ICD-10-CM

## 2018-02-21 MED ORDER — DOXYCYCLINE HYCLATE 100 MG PO TABS: 100.0000 mg | ORAL_TABLET | Freq: Two times a day (BID) | ORAL | 0 refills | Status: DC

## 2018-02-21 NOTE — Progress Notes (Signed)
Subjective:    Patient ID: Margaret Golden, female    DOB: 02/06/1991, 27 y.o.   MRN: [id number]  HPI   Margaret Golden is a 27 yr old female who presents today to discuss sinus congestion. Started >1 week ago. Symptoms worsened 4 days ago. Notes that drainage is "starting to become yellow."    Denies fever.  + fatigue- using dayquil/delsym which does help.  + sore throat and ear pain (x3 days)    Review of Systems See HPI  Past Medical History:  Diagnosis Date  . ADD (attention deficit disorder)   . Anxiety   . Depression   . Fatty liver   . History of chicken pox   . History of kidney stones   . Thyroid disease    hypothyroid     Social History   Socioeconomic History  . Marital status: Single    Spouse name: Not on file  . Number of children: Not on file  . Years of education: 4012  . Highest education level: Not on file  Occupational History  . Not on file  Social Needs  . Financial resource strain: Not on file  . Food insecurity:    Worry: Not on file    Inability: Not on file  . Transportation needs:    Medical: Not on file    Non-medical: Not on file  Tobacco Use  . Smoking status: Never Smoker  . Smokeless tobacco: Never Used  Substance and Sexual Activity  . Alcohol use: Never    Alcohol/week: 0.0 standard drinks    Frequency: Never  . Drug use: No  . Sexual activity: Never  Lifestyle  . Physical activity:    Days per week: Not on file    Minutes per session: Not on file  . Stress: Not on file  Relationships  . Social connections:    Talks on phone: Not on file    Gets together: Not on file    Attends religious service: Not on file    Active member of club or organization: Not on file    Attends meetings of clubs or organizations: Not on file    Relationship status: Not on file  . Intimate partner violence:    Fear of current or ex partner: Not on file    Emotionally abused: Not on file    Physically abused: Not on file    Forced sexual  activity: Not on file  Other Topics Concern  . Not on file  Social History Narrative   Unemployed   Lives with Dad   Seeking work   Only child   Dog 3   Lives in a one story house with father, has a cat-12/26/16-sjb    Past Surgical History:  Procedure Laterality Date  . KNEE ARTHROSCOPY Right 2007  . TONSILLECTOMY  2010    Family History  Problem Relation Age of Onset  . Cancer Paternal Aunt        colon  . Cancer Paternal Uncle        "small cell cancer"  . Diabetes Maternal Grandfather   . Arthritis Maternal Grandfather   . Arthritis Maternal Grandmother   . Breast cancer Maternal Grandmother   . Arthritis Paternal Grandmother   . Arthritis Paternal Grandfather     Allergies  Allergen Reactions  . Augmentin [Amoxicillin-Pot Clavulanate]     hives    Current Outpatient Medications on File Prior to Visit  Medication Sig Dispense Refill  . CONCERTA 36  MG CR tablet   0  . Ferrous Sulfate (IRON) 28 MG TABS Take 2 tablets by mouth daily.    Marland Kitchen gabapentin (NEURONTIN) 300 MG capsule Take 600 mg by mouth at bedtime.  0  . Gabapentin Enacarbil 600 MG TBCR Take by mouth.    . lamoTRIgine (LAMICTAL) 150 MG tablet Take 1 tablet by mouth daily.  0  . levocetirizine (XYZAL) 5 MG tablet Take 1 tablet (5 mg total) by mouth every evening. 30 tablet 3  . levothyroxine (SYNTHROID) 175 MCG tablet Take 1 tablet (175 mcg total) by mouth daily before breakfast. 30 tablet 5  . meloxicam (MOBIC) 7.5 MG tablet Take 1 tablet (7.5 mg total) by mouth daily. 14 tablet 0  . Multiple Vitamins-Minerals (MULTIVITAMIN WITH MINERALS) tablet Take 1 tablet by mouth daily.    Marland Kitchen omeprazole (PRILOSEC) 20 MG capsule Take 2 capsules (40 mg total) by mouth daily. 60 capsule 5  . oxyCODONE-acetaminophen (PERCOCET) 5-325 MG tablet Take 1 tablet by mouth every 4 (four) hours as needed for severe pain. 20 tablet 0  . sertraline (ZOLOFT) 100 MG tablet Take 1 1/2 tablets once a day.  0  . topiramate (TOPAMAX) 50  MG tablet Take 1 tablet (50 mg total) by mouth at bedtime. 30 tablet 5  . traZODone (DESYREL) 100 MG tablet Take 100 mg by mouth at bedtime as needed.  0   No current facility-administered medications on file prior to visit.     BP 119/82 (BP Location: Right Wrist, Patient Position: Sitting, Cuff Size: Large)   Pulse 96   Temp 98.3 F (36.8 C) (Oral)   Ht 5\' 7"  (1.702 m)   Wt (!) 354 lb 3.2 oz (160.7 kg)   LMP 01/20/2018 (Exact Date)   SpO2 100%   BMI 55.48 kg/m       Objective:   Physical Exam  Constitutional: She is oriented to person, place, and time. She appears well-developed and well-nourished.  HENT:  Right Ear: Tympanic membrane and ear canal normal.  Left Ear: Tympanic membrane and ear canal normal.  Nose: Right sinus exhibits maxillary sinus tenderness and frontal sinus tenderness. Left sinus exhibits maxillary sinus tenderness and frontal sinus tenderness.  Mouth/Throat: No posterior oropharyngeal edema or posterior oropharyngeal erythema.  Neck: Neck supple. No thyromegaly present.  Cardiovascular: Normal rate, regular rhythm and normal heart sounds.  No murmur heard. Pulmonary/Chest: Effort normal and breath sounds normal. No respiratory distress. She has no wheezes.  Neurological: She is alert and oriented to person, place, and time.  Skin: Skin is warm and dry.  Psychiatric: She has a normal mood and affect. Her behavior is normal. Judgment and thought content normal.          Assessment & Plan:  Sinusitis- New, will treat with doxycycline.  Patient is allergic to Augmentin.  Continue Delsym as needed for cough.  She is advised that she may use Tylenol or ibuprofen as needed for pain. She is to call if symptoms worsen or if they are not at least somewhat improved in 3 days.  Patient verbalizes understanding.

## 2018-02-21 NOTE — Telephone Encounter (Signed)
Pt. Phoned [facility], [facility] relayed to author that pt. Calling to relay that symptoms not improved from 11/20 visit with PCP. Author spoke to [place] and Melissa advised to continue with meloxicam for another week or so for sciatic pain (primary reason for her 11/20 visit), and if pt. Is agreeable, can place PT referral as well. Author phoned pt., and she said "no, it's not my pain, it's my cold symptoms". Author offered to make an appointment with PCP this afternoon, and pt. Agreed. Appointment made for 320PM today.

## 2018-02-21 NOTE — Telephone Encounter (Signed)
Noted  

## 2018-02-21 NOTE — Patient Instructions (Signed)
Begin doxycycline for sinus infection. You may use tylenol or motrin as needed for pain and delsym as needed for cough.  Call if symptoms worsen or if symptom are not improved in 3 days.

## 2018-02-24 ENCOUNTER — Telehealth: Payer: Self-pay | Admitting: *Deleted

## 2018-02-24 NOTE — Telephone Encounter (Signed)
Copied from CRM [id number]. Topic: General - Other >> Feb 21, 2018 12:17 PM Ronney LionArrington, Shykila A wrote: Reason for CRM: pt came in for a OV with Melissa O'sullivan  on 02/19/18. She is saying her symptoms have not improved, she would like to know what to do.

## 2018-02-24 NOTE — Telephone Encounter (Signed)
Patient came in the office and [facility] (02/[facility]/18[facility]//AB/CMA

## 2018-02-24 NOTE — Telephone Encounter (Addendum)
Received call from Clydie BraunKaren at [facility] on (02/19/18) to say that they were unable to run the CBC on the blood that was drawn because there was a clot in the blood.  She stated that the CBC will need to be recollected.  Informed Melissa of the call from the lab regarding a recollect on the blood.  Melissa stated that we can wait on the recollect.  We can get the CBC at her next visit.//AB/CMA

## 2018-02-25 DIAGNOSIS — F4322 Adjustment disorder with anxiety: Secondary | ICD-10-CM | POA: Diagnosis not present

## 2018-02-26 ENCOUNTER — Encounter: Payer: Self-pay | Admitting: Emergency Medicine

## 2018-02-26 DIAGNOSIS — F330 Major depressive disorder, recurrent, mild: Secondary | ICD-10-CM

## 2018-02-26 DIAGNOSIS — G4700 Insomnia, unspecified: Secondary | ICD-10-CM

## 2018-02-26 DIAGNOSIS — F4010 Social phobia, unspecified: Secondary | ICD-10-CM

## 2018-02-26 DIAGNOSIS — F900 Attention-deficit hyperactivity disorder, predominantly inattentive type: Secondary | ICD-10-CM

## 2018-03-03 DIAGNOSIS — F4322 Adjustment disorder with anxiety: Secondary | ICD-10-CM | POA: Diagnosis not present

## 2018-03-06 ENCOUNTER — Telehealth: Payer: Self-pay | Admitting: *Deleted

## 2018-03-06 DIAGNOSIS — D509 Iron deficiency anemia, unspecified: Secondary | ICD-10-CM

## 2018-03-06 DIAGNOSIS — E039 Hypothyroidism, unspecified: Secondary | ICD-10-CM

## 2018-03-06 NOTE — Telephone Encounter (Signed)
-----   Message from Sandford CrazeMelissa O'Sullivan, NP sent at 02/19/2018 10:27 PM EST ----- Labs show she need to restart iron supplement and take synthroid daily. Let's repeat TSH and serum iron in 6 weeks. Dx hypothyroid and iron def anemia. Sugar mildly elevated. Weight loss will help with this.

## 2018-03-06 NOTE — Telephone Encounter (Signed)
Notified pt and she voices understanding. Lab appt scheduled for 04/17/18 at 1:30pm. Future lab orders placed.

## 2018-03-10 DIAGNOSIS — F4322 Adjustment disorder with anxiety: Secondary | ICD-10-CM | POA: Diagnosis not present

## 2018-03-12 ENCOUNTER — Encounter: Payer: Self-pay | Admitting: Psychiatry

## 2018-03-12 ENCOUNTER — Ambulatory Visit: Payer: BLUE CROSS/BLUE SHIELD | Admitting: Psychiatry

## 2018-03-12 VITALS — BP 121/79 | HR 100

## 2018-03-12 DIAGNOSIS — G4700 Insomnia, unspecified: Secondary | ICD-10-CM | POA: Diagnosis not present

## 2018-03-12 DIAGNOSIS — F330 Major depressive disorder, recurrent, mild: Secondary | ICD-10-CM

## 2018-03-12 DIAGNOSIS — F4010 Social phobia, unspecified: Secondary | ICD-10-CM | POA: Diagnosis not present

## 2018-03-12 DIAGNOSIS — F900 Attention-deficit hyperactivity disorder, predominantly inattentive type: Secondary | ICD-10-CM

## 2018-03-12 DIAGNOSIS — G2581 Restless legs syndrome: Secondary | ICD-10-CM

## 2018-03-12 MED ORDER — GABAPENTIN ENACARBIL ER 600 MG PO TBCR: 600.0000 mg | EXTENDED_RELEASE_TABLET | Freq: Every day | ORAL | 1 refills | Status: DC

## 2018-03-12 MED ORDER — LAMOTRIGINE 200 MG PO TABS: 200.0000 mg | ORAL_TABLET | ORAL | 1 refills | Status: DC

## 2018-03-12 MED ORDER — METHYLPHENIDATE HCL ER (OSM) 54 MG PO TBCR: 54.0000 mg | EXTENDED_RELEASE_TABLET | ORAL | 0 refills | Status: DC

## 2018-03-12 MED ORDER — SERTRALINE HCL 100 MG PO TABS: 200.0000 mg | ORAL_TABLET | Freq: Every day | ORAL | 1 refills | Status: DC

## 2018-03-12 MED ORDER — TRAZODONE HCL 100 MG PO TABS: ORAL_TABLET | ORAL | 1 refills | Status: DC

## 2018-03-12 NOTE — Progress Notes (Signed)
Margaret Golden [id number] 1990/10/15 27 y.o.  Subjective:   Patient ID:  Margaret Golden is a 27 y.o. (DOB 12-Sep-1990) female.  Chief Complaint:  Chief Complaint  Patient presents with  . Follow-up    Anxiety, depression, ADD, insomnia.     HPI Margaret Golden presents to the office today for follow-up of anxiety, depression, and ADD> She reports that she is doing "better." Reports that they bought a condo with the plan for her to either rent or buy it from her father in the future. She has moved in to condo after living in a hotel for a month. Reports that they are in the process of making some renovations.   She reports that anxiety was elevated with move and situational stressors and has improved. She reports that she notices social anxiety in crowds and people that she does not know well. Reports that she had significant anxiety with tightness in her chest in a store when approached by a customer. Denies any full blown panic attacks. Reports December is a difficulty month for her with anniversaries and thinking about deceased mother who really enjoyed the holidays. Reports some sadness related to these triggers and reports that mood is not as depressed compared to past Decembers. Describes mood as "down and gloomy, but not depression." Reports that she is feeling more optimistic and hopeful about the future- "it feels like everything is finally coming together." Reports that she has been having some recent insomnia with difficulty falling asleep. Reports that she misplaced Trazodone during the move. She reports that her appetite was up and down and is starting to stabilize. She reports that her energy has been low which she attributes to decreased sleep. Reports motivation has been good. Concentration has been impaired and has difficulty sustaining focus and will switch between tasks and applications on her computer. Denies SI.   Has been working with vocational rehab to find jobs and  requesting jobs where she can be in the "background" due to social anxiety. Continues to see therapist weekly.   Past medication trials: Sertraline-helpful for depression and anxiety Wellbutrin Lexapro-ineffective Cymbalta Pristiq Seroquel Latuda Abilify Propanolol Melatonin Vyvanse Adderall XR Concerta Horizant Lamictal-effective for mood Trazodone-effective for insomnia but causes some excessive drowsiness  Review of Systems:  Review of Systems  HENT: Positive for ear pain.        Recent URI/virus.   Musculoskeletal: Positive for back pain. Negative for gait problem.  Neurological: Negative for tremors.  Psychiatric/Behavioral:       Please refer to HPI    Medications: I have reviewed the patient's current medications.  Current Outpatient Medications  Medication Sig Dispense Refill  . Gabapentin Enacarbil 600 MG TBCR Take 1 tablet (600 mg total) by mouth daily at 6 PM. 90 tablet 1  . lamoTRIgine (LAMICTAL) 200 MG tablet Take 1 tablet (200 mg total) by mouth every morning. 90 tablet 1  . levothyroxine (SYNTHROID) 175 MCG tablet Take 1 tablet (175 mcg total) by mouth daily before breakfast. (Patient taking differently: Take 150 mcg by mouth daily before breakfast. ) 30 tablet 5  . methylphenidate 54 MG PO CR tablet Take 1 tablet (54 mg total) by mouth every morning. 30 tablet 0  . Multiple Vitamins-Minerals (MULTIVITAMIN WITH MINERALS) tablet Take 1 tablet by mouth daily.    Marland Kitchen omeprazole (PRILOSEC) 20 MG capsule Take 2 capsules (40 mg total) by mouth daily. 60 capsule 5  . sertraline (ZOLOFT) 100 MG tablet 200 mg. Take 1 1/2  tablets once a day.  0  . Ferrous Sulfate (IRON) 28 MG TABS Take 2 tablets by mouth daily.    . meloxicam (MOBIC) 7.5 MG tablet Take 1 tablet (7.5 mg total) by mouth daily. (Patient not taking: Reported on 03/12/2018) 14 tablet 0  . [START ON 04/09/2018] methylphenidate (CONCERTA) 54 MG PO CR tablet Take 1 tablet (54 mg total) by mouth every morning. 30  tablet 0  . oxyCODONE-acetaminophen (PERCOCET) 5-325 MG tablet Take 1 tablet by mouth every 4 (four) hours as needed for severe pain. (Patient not taking: Reported on 03/12/2018) 20 tablet 0  . sertraline (ZOLOFT) 100 MG tablet Take 2 tablets (200 mg total) by mouth daily. 180 tablet 1  . topiramate (TOPAMAX) 50 MG tablet Take 1 tablet (50 mg total) by mouth at bedtime. (Patient not taking: Reported on 03/12/2018) 30 tablet 5  . traZODone (DESYREL) 100 MG tablet Take 1/2-1 po QHS prn insomnia 90 tablet 1   No current facility-administered medications for this visit.     Medication Side Effects: None  Allergies:  Allergies  Allergen Reactions  . Augmentin [Amoxicillin-Pot Clavulanate]     hives    Past Medical History:  Diagnosis Date  . ADD (attention deficit disorder)   . Anxiety   . Depression   . Fatty liver   . History of chicken pox   . History of kidney stones   . RLS (restless legs syndrome)   . Thyroid disease    hypothyroid    Family History  Problem Relation Age of Onset  . Depression Mother   . Depression Father   . Cancer Paternal Aunt        colon  . Cancer Paternal Uncle        "small cell cancer"  . Diabetes Maternal Grandfather   . Arthritis Maternal Grandfather   . Arthritis Maternal Grandmother   . Breast cancer Maternal Grandmother   . Arthritis Paternal Grandmother   . Arthritis Paternal Grandfather     Social History   Socioeconomic History  . Marital status: Single    Spouse name: Not on file  . Number of children: Not on file  . Years of education: 24  . Highest education level: Not on file  Occupational History  . Not on file  Social Needs  . Financial resource strain: Not on file  . Food insecurity:    Worry: Not on file    Inability: Not on file  . Transportation needs:    Medical: Not on file    Non-medical: Not on file  Tobacco Use  . Smoking status: Never Smoker  . Smokeless tobacco: Never Used  Substance and Sexual  Activity  . Alcohol use: Never    Alcohol/week: 0.0 standard drinks    Frequency: Never  . Drug use: No  . Sexual activity: Never  Lifestyle  . Physical activity:    Days per week: Not on file    Minutes per session: Not on file  . Stress: Not on file  Relationships  . Social connections:    Talks on phone: Not on file    Gets together: Not on file    Attends religious service: Not on file    Active member of club or organization: Not on file    Attends meetings of clubs or organizations: Not on file    Relationship status: Not on file  . Intimate partner violence:    Fear of current or ex partner: Not on file  Emotionally abused: Not on file    Physically abused: Not on file    Forced sexual activity: Not on file  Other Topics Concern  . Not on file  Social History Narrative   Unemployed   Lives with Dad   Seeking work   Only child   Dog 3   Lives in a one story house with father, has a cat-12/26/16-sjb    Past Medical History, Surgical history, Social history, and Family history were reviewed and updated as appropriate.   Please see review of systems for further details on the patient's review from today.   Objective:   Physical Exam:  BP 121/79   Pulse 100   Physical Exam Constitutional:      General: She is not in acute distress.    Appearance: She is well-developed.  Musculoskeletal:        General: No deformity.  Neurological:     Mental Status: She is alert and oriented to person, place, and time.     Coordination: Coordination normal.  Psychiatric:        Mood and Affect: Mood is not anxious or depressed. Affect is not labile, blunt, angry or inappropriate.        Speech: Speech normal.        Behavior: Behavior normal.        Thought Content: Thought content normal. Thought content does not include homicidal or suicidal ideation. Thought content does not include homicidal or suicidal plan.        Judgment: Judgment normal.     Comments: Insight  intact. No auditory or visual hallucinations. No delusions.      Lab Review:     Component Value Date/Time   NA 138 02/19/2018 1214   K 3.6 02/19/2018 1214   CL 104 02/19/2018 1214   CO2 25 02/19/2018 1214   GLUCOSE 111 (H) 02/19/2018 1214   BUN 10 02/19/2018 1214   CREATININE 0.71 02/19/2018 1214   CREATININE 0.75 07/06/2016 1534   CALCIUM 8.4 02/19/2018 1214   PROT 8.1 07/26/2017 1014   ALBUMIN 4.5 07/26/2017 1014   AST 33 07/26/2017 1014   ALT 21 07/26/2017 1014   ALKPHOS 82 07/26/2017 1014   BILITOT 0.5 07/26/2017 1014       Component Value Date/Time   WBC 7.8 08/06/2017 1359   RBC 5.17 (H) 08/06/2017 1359   HGB 13.7 08/06/2017 1359   HCT 41.7 08/06/2017 1359   PLT 201.0 08/06/2017 1359   MCV 80.6 08/06/2017 1359   MCH 23.7 (L) 05/27/2015 1408   MCHC 32.9 08/06/2017 1359   RDW 14.0 08/06/2017 1359   LYMPHSABS 2.3 08/06/2017 1359   MONOABS 0.4 08/06/2017 1359   EOSABS 0.2 08/06/2017 1359   BASOSABS 0.0 08/06/2017 1359    No results found for: POCLITH, LITHIUM   No results found for: PHENYTOIN, PHENOBARB, VALPROATE, CBMZ   .res Assessment: Plan:   Continue Zoloft 200 mg daily for mood and anxiety Continue Concerta 54 mg daily for attention deficit Continue lamotrigine 200 mg daily for mood. Continue Horizant 600 mg in the evening for restless leg syndrome. Discussed restarting trazodone as needed to help improve acute episode of insomnia. Social phobia - Plan: sertraline (ZOLOFT) 100 MG tablet  Major depressive disorder, recurrent episode, mild (HCC) - Plan: lamoTRIgine (LAMICTAL) 200 MG tablet, sertraline (ZOLOFT) 100 MG tablet  Attention deficit hyperactivity disorder (ADHD), predominantly inattentive type - Plan: methylphenidate 54 MG PO CR tablet, methylphenidate (CONCERTA) 54 MG PO CR tablet  Insomnia, unspecified type - Plan: traZODone (DESYREL) 100 MG tablet  RLS (restless legs syndrome) - Plan: Gabapentin Enacarbil 600 MG TBCR  Please see After  Visit Summary for patient specific instructions.  Future Appointments  Date Time Provider Department Center  03/14/2018  1:00 PM [name], NP [facility]  04/17/2018  1:30 PM [facility]  05/13/2018  1:30 PM Corie Chiquito, PMHNP [facility] None    No orders of the defined types were placed in this encounter.     -------------------------------

## 2018-03-14 ENCOUNTER — Encounter: Payer: Self-pay | Admitting: Family

## 2018-03-14 ENCOUNTER — Ambulatory Visit: Payer: BLUE CROSS/BLUE SHIELD | Admitting: Family

## 2018-03-14 VITALS — BP 142/89 | HR 107 | Temp 98.8°F | Resp 16 | Ht 67.0 in | Wt 356.0 lb

## 2018-03-14 DIAGNOSIS — H6692 Otitis media, unspecified, left ear: Secondary | ICD-10-CM

## 2018-03-14 DIAGNOSIS — J329 Chronic sinusitis, unspecified: Secondary | ICD-10-CM

## 2018-03-14 MED ORDER — AZITHROMYCIN 250 MG PO TABS: ORAL_TABLET | ORAL | 0 refills | Status: DC

## 2018-03-14 NOTE — Patient Instructions (Signed)
Please start azithromycin. Call if new/worsening symptoms or if not improved in 3-4 days.

## 2018-03-14 NOTE — Progress Notes (Signed)
Subjective:    Patient ID: Margaret Golden, female    DOB: 09/06/1990, 27 y.o.   MRN: [id number]  HPI  Patient is a 27 yr old female who presents today with chief complaint of otalgia. Pain is located in both ears. Reports associated cough.   Reports symptoms improved with doxy but cough continued. Then ear pain returned. Sinus congestion returned.   Review of Systems See HPI  Past Medical History:  Diagnosis Date  . ADD (attention deficit disorder)   . Anxiety   . Depression   . Fatty liver   . History of chicken pox   . History of kidney stones   . RLS (restless legs syndrome)   . Thyroid disease    hypothyroid     Social History   Socioeconomic History  . Marital status: Single    Spouse name: Not on file  . Number of children: Not on file  . Years of education: 4712  . Highest education level: Not on file  Occupational History  . Not on file  Social Needs  . Financial resource strain: Not on file  . Food insecurity:    Worry: Not on file    Inability: Not on file  . Transportation needs:    Medical: Not on file    Non-medical: Not on file  Tobacco Use  . Smoking status: Never Smoker  . Smokeless tobacco: Never Used  Substance and Sexual Activity  . Alcohol use: Never    Alcohol/week: 0.0 standard drinks    Frequency: Never  . Drug use: No  . Sexual activity: Never  Lifestyle  . Physical activity:    Days per week: Not on file    Minutes per session: Not on file  . Stress: Not on file  Relationships  . Social connections:    Talks on phone: Not on file    Gets together: Not on file    Attends religious service: Not on file    Active member of club or organization: Not on file    Attends meetings of clubs or organizations: Not on file    Relationship status: Not on file  . Intimate partner violence:    Fear of current or ex partner: Not on file    Emotionally abused: Not on file    Physically abused: Not on file    Forced sexual activity: Not  on file  Other Topics Concern  . Not on file  Social History Narrative   Unemployed   Lives with Dad   Seeking work   Only child   Dog 3   Lives in a one story house with father, has a cat-12/26/16-sjb    Past Surgical History:  Procedure Laterality Date  . KNEE ARTHROSCOPY Right 2007  . TONSILLECTOMY  2010  . WISDOM TOOTH EXTRACTION      Family History  Problem Relation Age of Onset  . Depression Mother   . Depression Father   . Cancer Paternal Aunt        colon  . Cancer Paternal Uncle        "small cell cancer"  . Diabetes Maternal Grandfather   . Arthritis Maternal Grandfather   . Arthritis Maternal Grandmother   . Breast cancer Maternal Grandmother   . Arthritis Paternal Grandmother   . Arthritis Paternal Grandfather     Allergies  Allergen Reactions  . Augmentin [Amoxicillin-Pot Clavulanate]     hives    Current Outpatient Medications on File Prior to  Visit  Medication Sig Dispense Refill  . Ferrous Sulfate (IRON) 28 MG TABS Take 2 tablets by mouth daily.    Marland Kitchen lamoTRIgine (LAMICTAL) 200 MG tablet Take 1 tablet (200 mg total) by mouth every morning. 90 tablet 1  . levothyroxine (SYNTHROID) 175 MCG tablet Take 1 tablet (175 mcg total) by mouth daily before breakfast. (Patient taking differently: Take 150 mcg by mouth daily before breakfast. ) 30 tablet 5  . meloxicam (MOBIC) 7.5 MG tablet Take 1 tablet (7.5 mg total) by mouth daily. 14 tablet 0  . methylphenidate 54 MG PO CR tablet Take 1 tablet (54 mg total) by mouth every morning. 30 tablet 0  . Multiple Vitamins-Minerals (MULTIVITAMIN WITH MINERALS) tablet Take 1 tablet by mouth daily.    Marland Kitchen omeprazole (PRILOSEC) 20 MG capsule Take 2 capsules (40 mg total) by mouth daily. 60 capsule 5  . sertraline (ZOLOFT) 100 MG tablet Take 2 tablets (200 mg total) by mouth daily. 180 tablet 1  . topiramate (TOPAMAX) 50 MG tablet Take 1 tablet (50 mg total) by mouth at bedtime. 30 tablet 5  . traZODone (DESYREL) 100 MG  tablet Take 1/2-1 po QHS prn insomnia 90 tablet 1   No current facility-administered medications on file prior to visit.     BP (!) 142/89 (BP Location: Right Arm, Patient Position: Sitting, Cuff Size: Large)   Pulse (!) 107   Temp 98.8 F (37.1 C) (Oral)   Resp 16   Ht 5\' 7"  (1.702 m)   Wt (!) 356 lb (161.5 kg)   SpO2 100%   BMI 55.76 kg/m       Objective:   Physical Exam Constitutional:      Appearance: She is well-developed.  HENT:     Head: Normocephalic and atraumatic.     Right Ear: Ear canal normal. Tympanic membrane is erythematous and bulging.     Left Ear: Tympanic membrane and ear canal normal. Tympanic membrane is not erythematous, retracted or bulging.     Nose:     Right Sinus: Maxillary sinus tenderness and frontal sinus tenderness present.     Left Sinus: Maxillary sinus tenderness and frontal sinus tenderness present.  Neck:     Musculoskeletal: Neck supple.     Thyroid: No thyromegaly.  Cardiovascular:     Rate and Rhythm: Normal rate and regular rhythm.     Heart sounds: Normal heart sounds. No murmur.  Pulmonary:     Effort: Pulmonary effort is normal. No respiratory distress.     Breath sounds: Normal breath sounds. No wheezing.  Skin:    General: Skin is warm and dry.  Neurological:     Mental Status: She is alert and oriented to person, place, and time.  Psychiatric:        Behavior: Behavior normal.        Thought Content: Thought content normal.        Judgment: Judgment normal.           Assessment & Plan:  Left OM/sinusitis- rx with azithromycin.Pt is advised to call if symptoms worsen or if they fail to improve.

## 2018-03-17 DIAGNOSIS — F4322 Adjustment disorder with anxiety: Secondary | ICD-10-CM | POA: Diagnosis not present

## 2018-03-24 DIAGNOSIS — F4322 Adjustment disorder with anxiety: Secondary | ICD-10-CM | POA: Diagnosis not present

## 2018-03-28 DIAGNOSIS — F4322 Adjustment disorder with anxiety: Secondary | ICD-10-CM | POA: Diagnosis not present

## 2018-04-04 DIAGNOSIS — F4323 Adjustment disorder with mixed anxiety and depressed mood: Secondary | ICD-10-CM | POA: Diagnosis not present

## 2018-04-07 DIAGNOSIS — F4323 Adjustment disorder with mixed anxiety and depressed mood: Secondary | ICD-10-CM | POA: Diagnosis not present

## 2018-04-15 DIAGNOSIS — F4323 Adjustment disorder with mixed anxiety and depressed mood: Secondary | ICD-10-CM | POA: Diagnosis not present

## 2018-04-17 ENCOUNTER — Telehealth: Payer: Self-pay | Admitting: Family

## 2018-04-17 ENCOUNTER — Other Ambulatory Visit (INDEPENDENT_AMBULATORY_CARE_PROVIDER_SITE_OTHER): Payer: BLUE CROSS/BLUE SHIELD

## 2018-04-17 DIAGNOSIS — D509 Iron deficiency anemia, unspecified: Secondary | ICD-10-CM | POA: Diagnosis not present

## 2018-04-17 DIAGNOSIS — E039 Hypothyroidism, unspecified: Secondary | ICD-10-CM | POA: Diagnosis not present

## 2018-04-17 LAB — TSH: TSH: 4.15 u[IU]/mL (ref 0.35–4.50)

## 2018-04-17 LAB — IRON: IRON: 31 ug/dL — AB (ref 42–145)

## 2018-04-17 NOTE — Telephone Encounter (Signed)
See mychart.  

## 2018-04-22 DIAGNOSIS — F4323 Adjustment disorder with mixed anxiety and depressed mood: Secondary | ICD-10-CM | POA: Diagnosis not present

## 2018-04-28 DIAGNOSIS — F4323 Adjustment disorder with mixed anxiety and depressed mood: Secondary | ICD-10-CM | POA: Diagnosis not present

## 2018-05-02 ENCOUNTER — Encounter: Payer: Self-pay | Admitting: Family

## 2018-05-02 ENCOUNTER — Ambulatory Visit: Payer: BLUE CROSS/BLUE SHIELD | Admitting: Family

## 2018-05-02 VITALS — BP 141/98 | HR 109 | Temp 98.2°F | Resp 20 | Ht 67.0 in | Wt 357.0 lb

## 2018-05-02 DIAGNOSIS — E611 Iron deficiency: Secondary | ICD-10-CM | POA: Diagnosis not present

## 2018-05-02 DIAGNOSIS — L918 Other hypertrophic disorders of the skin: Secondary | ICD-10-CM

## 2018-05-02 DIAGNOSIS — R5383 Other fatigue: Secondary | ICD-10-CM

## 2018-05-02 DIAGNOSIS — R030 Elevated blood-pressure reading, without diagnosis of hypertension: Secondary | ICD-10-CM

## 2018-05-02 LAB — CBC WITH DIFFERENTIAL/PLATELET
Absolute Monocytes: 451 cells/uL (ref 200–950)
BASOS PCT: 0.6 %
Basophils Absolute: 59 cells/uL (ref 0–200)
EOS ABS: 167 {cells}/uL (ref 15–500)
Eosinophils Relative: 1.7 %
HCT: 39.5 % (ref 35.0–45.0)
HEMOGLOBIN: 12.1 g/dL (ref 11.7–15.5)
Lymphs Abs: 2656 cells/uL (ref 850–3900)
MCH: 22.5 pg — AB (ref 27.0–33.0)
MCHC: 30.6 g/dL — ABNORMAL LOW (ref 32.0–36.0)
MCV: 73.4 fL — ABNORMAL LOW (ref 80.0–100.0)
MONOS PCT: 4.6 %
MPV: 10.1 fL (ref 7.5–12.5)
NEUTROS ABS: 6468 {cells}/uL (ref 1500–7800)
Neutrophils Relative %: 66 %
PLATELETS: 257 10*3/uL (ref 140–400)
RBC: 5.38 10*6/uL — AB (ref 3.80–5.10)
RDW: 14 % (ref 11.0–15.0)
TOTAL LYMPHOCYTE: 27.1 %
WBC: 9.8 10*3/uL (ref 3.8–10.8)

## 2018-05-02 NOTE — Addendum Note (Signed)
Addended by: Mervin Kung A on: 05/02/2018 03:33 PM   Modules accepted: Orders

## 2018-05-02 NOTE — Progress Notes (Signed)
Subjective:    Patient ID: Margaret Golden, female    DOB: 08/01/1990, 28 y.o.   MRN: [id number]  HPI   Patient is a 28 yr old female with hx of hypothyroid, morbid obesity, depression, anxiety and ADD who presents today with report of low energy, sleeping more. She is seeing psychiatry Corie Chiquito(Jessica Carter) in February.  She sees her on 05/13/18. She restarted concerta for >1 month.  Staying in the bed for 12-14 hrs a day.  Low energy.   Wt Readings from Last 3 Encounters:  05/02/18 (!) 357 lb (161.9 kg)  03/14/18 (!) 356 lb (161.5 kg)  02/21/18 (!) 354 lb 3.2 oz (160.7 kg)   BP Readings from Last 3 Encounters:  05/02/18 (!) 141/98  03/14/18 (!) 142/89  02/21/18 119/82   Iron deficiency- not taking iron.   Reports multiple skin tags around her neck and one near her bra-line. Would like referral to dermatology.   Review of Systems See HPI  Past Medical History:  Diagnosis Date  . ADD (attention deficit disorder)   . Anxiety   . Depression   . Fatty liver   . History of chicken pox   . History of kidney stones   . RLS (restless legs syndrome)   . Thyroid disease    hypothyroid     Social History   Socioeconomic History  . Marital status: Single    Spouse name: Not on file  . Number of children: Not on file  . Years of education: 3212  . Highest education level: Not on file  Occupational History  . Not on file  Social Needs  . Financial resource strain: Not on file  . Food insecurity:    Worry: Not on file    Inability: Not on file  . Transportation needs:    Medical: Not on file    Non-medical: Not on file  Tobacco Use  . Smoking status: Never Smoker  . Smokeless tobacco: Never Used  Substance and Sexual Activity  . Alcohol use: Never    Alcohol/week: 0.0 standard drinks    Frequency: Never  . Drug use: No  . Sexual activity: Never  Lifestyle  . Physical activity:    Days per week: Not on file    Minutes per session: Not on file  . Stress: Not on file    Relationships  . Social connections:    Talks on phone: Not on file    Gets together: Not on file    Attends religious service: Not on file    Active member of club or organization: Not on file    Attends meetings of clubs or organizations: Not on file    Relationship status: Not on file  . Intimate partner violence:    Fear of current or ex partner: Not on file    Emotionally abused: Not on file    Physically abused: Not on file    Forced sexual activity: Not on file  Other Topics Concern  . Not on file  Social History Narrative   Unemployed   Lives with Dad   Seeking work   Only child   Dog 3   Lives in a one story house with father, has a cat-12/26/16-sjb    Past Surgical History:  Procedure Laterality Date  . KNEE ARTHROSCOPY Right 2007  . TONSILLECTOMY  2010  . WISDOM TOOTH EXTRACTION      Family History  Problem Relation Age of Onset  . Depression Mother   .  Depression Father   . Cancer Paternal Aunt        colon  . Cancer Paternal Uncle        "small cell cancer"  . Diabetes Maternal Grandfather   . Arthritis Maternal Grandfather   . Arthritis Maternal Grandmother   . Breast cancer Maternal Grandmother   . Arthritis Paternal Grandmother   . Arthritis Paternal Grandfather     Allergies  Allergen Reactions  . Augmentin [Amoxicillin-Pot Clavulanate]     hives    Current Outpatient Medications on File Prior to Visit  Medication Sig Dispense Refill  . Ferrous Sulfate (IRON) 28 MG TABS Take 2 tablets by mouth daily.    . Gabapentin Enacarbil (HORIZANT) 600 MG TBCR Take 600 mg by mouth daily.    Marland Kitchen lamoTRIgine (LAMICTAL) 200 MG tablet Take 1 tablet (200 mg total) by mouth every morning. 90 tablet 1  . levothyroxine (SYNTHROID) 175 MCG tablet Take 1 tablet (175 mcg total) by mouth daily before breakfast. (Patient taking differently: Take 150 mcg by mouth daily before breakfast. ) 30 tablet 5  . Multiple Vitamins-Minerals (MULTIVITAMIN WITH MINERALS) tablet  Take 1 tablet by mouth daily.    Marland Kitchen omeprazole (PRILOSEC) 20 MG capsule Take 2 capsules (40 mg total) by mouth daily. 60 capsule 5  . sertraline (ZOLOFT) 100 MG tablet Take 2 tablets (200 mg total) by mouth daily. 180 tablet 1  . traZODone (DESYREL) 100 MG tablet Take 1/2-1 po QHS prn insomnia 90 tablet 1  . methylphenidate 54 MG PO CR tablet Take 1 tablet (54 mg total) by mouth every morning. 30 tablet 0   No current facility-administered medications on file prior to visit.     BP (!) 141/98 (BP Location: Left Arm, Patient Position: Sitting, Cuff Size: Large)   Pulse (!) 109   Temp 98.2 F (36.8 C) (Oral)   Resp 20   Ht 5\' 7"  (1.702 m)   Wt (!) 357 lb (161.9 kg)   SpO2 100%   BMI 55.91 kg/m       Objective:   Physical Exam Constitutional:      Appearance: She is well-developed.  Neck:     Musculoskeletal: Neck supple.     Thyroid: No thyromegaly.  Cardiovascular:     Rate and Rhythm: Normal rate and regular rhythm.     Heart sounds: Normal heart sounds. No murmur.  Pulmonary:     Effort: Pulmonary effort is normal. No respiratory distress.     Breath sounds: Normal breath sounds. No wheezing.  Skin:    General: Skin is warm and dry.  Neurological:     Mental Status: She is alert and oriented to person, place, and time.  Psychiatric:        Behavior: Behavior normal.        Thought Content: Thought content normal.        Judgment: Judgment normal.     Comments: Slightly flattened affect           Assessment & Plan:  Fatigue- had sleep study 2017 which was negative for OSA. Recent normal TSH. Suspect that depression is primary issue. Advised her to follow up as scheduled with psychiatry. Also, will check CBC.   Elevated blood pressure- has been higher since she started concerta. I have advised the patient to check bp once daily for 1 week, then send me readings. She will also discuss with psychiatry at her follow up.  I am concerned that concerta is contributing to  her elevated bp.  Morbid obesity- she is motivated to lose weight this year and is on the Medical weight list office wait list.  Iron deficiency- restart iron.   Skin tags- refer to dermatology.

## 2018-05-02 NOTE — Patient Instructions (Addendum)
Please restart iron 325mg  twice daily for iron deficiency.  Please check with your blood pressure once daily x 1 week, then send me your readings via mychart. Please call the weight loss clinic to see where you are on their wait list. [phone]

## 2018-05-05 DIAGNOSIS — F4323 Adjustment disorder with mixed anxiety and depressed mood: Secondary | ICD-10-CM | POA: Diagnosis not present

## 2018-05-12 DIAGNOSIS — F4323 Adjustment disorder with mixed anxiety and depressed mood: Secondary | ICD-10-CM | POA: Diagnosis not present

## 2018-05-13 ENCOUNTER — Ambulatory Visit (INDEPENDENT_AMBULATORY_CARE_PROVIDER_SITE_OTHER): Payer: BLUE CROSS/BLUE SHIELD | Admitting: Psychiatry

## 2018-05-13 VITALS — BP 122/73 | HR 87

## 2018-05-13 DIAGNOSIS — F900 Attention-deficit hyperactivity disorder, predominantly inattentive type: Secondary | ICD-10-CM | POA: Diagnosis not present

## 2018-05-13 DIAGNOSIS — F331 Major depressive disorder, recurrent, moderate: Secondary | ICD-10-CM

## 2018-05-13 DIAGNOSIS — F4010 Social phobia, unspecified: Secondary | ICD-10-CM | POA: Diagnosis not present

## 2018-05-13 MED ORDER — BREXPIPRAZOLE 0.5 MG PO TABS: ORAL_TABLET | ORAL | 0 refills | Status: AC

## 2018-05-13 MED ORDER — METHYLPHENIDATE HCL ER 55 MG PO CP24: 55.0000 mg | ORAL_CAPSULE | Freq: Every morning | ORAL | 0 refills | Status: DC

## 2018-05-13 NOTE — Progress Notes (Signed)
Margaret Golden [id number] May 31, 1990 28 y.o.  Subjective:   Patient ID:  Margaret Golden is a 28 y.o. (DOB 04-16-1990) female.  Chief Complaint:  Chief Complaint  Patient presents with  . Depression  . Anxiety  . ADD    HPI Margaret Golden presents to the office today for follow-up of depression, anxiety, and ADD. She is accompanied by her father.  She reports that her mood has been "up and down" with some increased depression. Denies any worsening irritability. She reports that her grandmother is in poor health and she is needing to find a job to [employer]. She reports some increased anxiety and leg has been shaking again. Denies recent panic attacks. Has had some worry and anxious thoughts- "not the worst they have been, but they have been there." She reports, "I'm either not sleeping or sleeping too much." She reports that her appetite has been up and down and has been forcing herself to eat. She reports that these s/s have been worsening over the last week. Energy and motivation have been "very low." Reports that her concentration has been poor. Denies SI.   She reports that Concerta seems to only be effective for about 4 hours. Discussed that PCP had concerns about elevation in BP that seems to be related to Concerta.   Reports that she has not yet re-started iron.   Home BP readings: 123/78 127/77 122/68 121/77 131/77 137/78 126/79  Past medication trials: Sertraline-helpful for depression and anxiety Wellbutrin Lexapro-ineffective Cymbalta Pristiq Seroquel- does not recall any significant improvement Latuda Abilify Propanolol Melatonin Vyvanse Adderall XR- not effective Concerta- Notices duration only lasts about 4 hours. No significant improvement with doses less than 54 mg.  Horizant Lamictal-effective for mood Trazodone-effective for insomnia but causes some excessive drowsiness  Review of Systems:  Review of Systems  Neurological: Positive for  light-headedness and headaches.       Increased migraines    Medications: I have reviewed the patient's current medications.  Current Outpatient Medications  Medication Sig Dispense Refill  . aspirin-acetaminophen-caffeine (EXCEDRIN MIGRAINE) 250-250-65 MG tablet Take by mouth every 6 (six) hours as needed for headache.    . Gabapentin Enacarbil (HORIZANT) 600 MG TBCR Take 600 mg by mouth daily.    Marland Kitchen lamoTRIgine (LAMICTAL) 200 MG tablet Take 1 tablet (200 mg total) by mouth every morning. 90 tablet 1  . levothyroxine (SYNTHROID) 175 MCG tablet Take 1 tablet (175 mcg total) by mouth daily before breakfast. (Patient taking differently: Take 150 mcg by mouth daily before breakfast. ) 30 tablet 5  . Multiple Vitamins-Minerals (MULTIVITAMIN WITH MINERALS) tablet Take 1 tablet by mouth daily.    Marland Kitchen omeprazole (PRILOSEC) 20 MG capsule Take 2 capsules (40 mg total) by mouth daily. 60 capsule 5  . sertraline (ZOLOFT) 100 MG tablet Take 2 tablets (200 mg total) by mouth daily. 180 tablet 1  . Brexpiprazole (REXULTI) 0.5 MG TABS Take 1 tablet (0.5 mg total) by mouth daily for 7 days, THEN 2 tablets (1 mg total) daily for 21 days. 30 tablet 0  . Ferrous Sulfate (IRON) 28 MG TABS Take 2 tablets by mouth daily.    . methylphenidate 54 MG PO CR tablet Take 1 tablet (54 mg total) by mouth every morning. 30 tablet 0  . Methylphenidate HCl ER (ADHANSIA XR) 55 MG CP24 Take 55 mg by mouth every morning for 30 days. 30 capsule 0  . traZODone (DESYREL) 100 MG tablet Take 1/2-1 po QHS prn insomnia (  Patient not taking: Reported on 05/13/2018) 90 tablet 1   No current facility-administered medications for this visit.     Medication Side Effects: Other: increased BP with Concerta  Allergies:  Allergies  Allergen Reactions  . Augmentin [Amoxicillin-Pot Clavulanate]     hives    Past Medical History:  Diagnosis Date  . ADD (attention deficit disorder)   . Anxiety   . Depression   . Fatty liver   . History  of chicken pox   . History of kidney stones   . RLS (restless legs syndrome)   . Thyroid disease    hypothyroid    Family History  Problem Relation Age of Onset  . Depression Mother   . Depression Father   . Cancer Paternal Aunt        colon  . Cancer Paternal Uncle        "small cell cancer"  . Diabetes Maternal Grandfather   . Arthritis Maternal Grandfather   . Arthritis Maternal Grandmother   . Breast cancer Maternal Grandmother   . Arthritis Paternal Grandmother   . Arthritis Paternal Grandfather     Social History   Socioeconomic History  . Marital status: Single    Spouse name: Not on file  . Number of children: Not on file  . Years of education: 31  . Highest education level: Not on file  Occupational History  . Not on file  Social Needs  . Financial resource strain: Not on file  . Food insecurity:    Worry: Not on file    Inability: Not on file  . Transportation needs:    Medical: Not on file    Non-medical: Not on file  Tobacco Use  . Smoking status: Never Smoker  . Smokeless tobacco: Never Used  Substance and Sexual Activity  . Alcohol use: Never    Alcohol/week: 0.0 standard drinks    Frequency: Never  . Drug use: No  . Sexual activity: Never  Lifestyle  . Physical activity:    Days per week: Not on file    Minutes per session: Not on file  . Stress: Not on file  Relationships  . Social connections:    Talks on phone: Not on file    Gets together: Not on file    Attends religious service: Not on file    Active member of club or organization: Not on file    Attends meetings of clubs or organizations: Not on file    Relationship status: Not on file  . Intimate partner violence:    Fear of current or ex partner: Not on file    Emotionally abused: Not on file    Physically abused: Not on file    Forced sexual activity: Not on file  Other Topics Concern  . Not on file  Social History Narrative   Unemployed   Lives with Dad   Seeking work    Only child   Dog 3   Lives in a one story house with father, has a cat-12/26/16-sjb    Past Medical History, Surgical history, Social history, and Family history were reviewed and updated as appropriate.   Please see review of systems for further details on the patient's review from today.   Objective:   Physical Exam:  BP 122/73   Pulse 87   Physical Exam Constitutional:      General: She is not in acute distress.    Appearance: She is well-developed.  Musculoskeletal:  General: No deformity.  Neurological:     Mental Status: She is alert and oriented to person, place, and time.     Coordination: Coordination normal.  Psychiatric:        Attention and Perception: Attention and perception normal. She does not perceive auditory or visual hallucinations.        Mood and Affect: Mood is anxious and depressed. Affect is not labile, blunt, angry or inappropriate.        Speech: Speech normal.        Behavior: Behavior normal.        Thought Content: Thought content normal. Thought content does not include homicidal or suicidal ideation. Thought content does not include homicidal or suicidal plan.        Cognition and Memory: Cognition and memory normal.        Judgment: Judgment normal.     Comments: Insight intact. No delusions.      Lab Review:     Component Value Date/Time   NA 138 02/19/2018 1214   K 3.6 02/19/2018 1214   CL 104 02/19/2018 1214   CO2 25 02/19/2018 1214   GLUCOSE 111 (H) 02/19/2018 1214   BUN 10 02/19/2018 1214   CREATININE 0.71 02/19/2018 1214   CREATININE 0.75 07/06/2016 1534   CALCIUM 8.4 02/19/2018 1214   PROT 8.1 07/26/2017 1014   ALBUMIN 4.5 07/26/2017 1014   AST 33 07/26/2017 1014   ALT 21 07/26/2017 1014   ALKPHOS 82 07/26/2017 1014   BILITOT 0.5 07/26/2017 1014       Component Value Date/Time   WBC 9.8 05/02/2018 1533   RBC 5.38 (H) 05/02/2018 1533   HGB 12.1 05/02/2018 1533   HCT 39.5 05/02/2018 1533   PLT 257 05/02/2018  1533   MCV 73.4 (L) 05/02/2018 1533   MCH 22.5 (L) 05/02/2018 1533   MCHC 30.6 (L) 05/02/2018 1533   RDW 14.0 05/02/2018 1533   LYMPHSABS 2,656 05/02/2018 1533   MONOABS 0.4 08/06/2017 1359   EOSABS 167 05/02/2018 1533   BASOSABS 59 05/02/2018 1533    No results found for: POCLITH, LITHIUM   No results found for: PHENYTOIN, PHENOBARB, VALPROATE, CBMZ   .res Assessment: Plan:   Discussed potential benefits, risks, and side effects of several tx options to include increasing Sertraline, augmentation with Rexulti, and changing Concerta to another stimulant. Pt reports that she would prefer to start trial of Rexulti for depression and possibly anxiety as well. Discussed potential metabolic side effects associated with atypical antipsychotics, as well as potential risk for movement side effects. Advised pt to contact office if movement side effects occur.  Will start Rexulti 0.5 mg po qd x 1 week, then increase to Rexulti 1 mg po qd for augmentation of depression.  Will d/c Concerta and start Adhansia since duration of Concerta has not been long enough.  Reviewed home BP readings and both home readings and today's reading are WNL. Suspect increased BP readings at PCP's may be due to anxiety since pt reports negative experience with having BP taken years ago and that BP cuffs trigger anxiety. Recommended pt take home BP readings regularly for at least the first week of taking Adhansia and to contact office if she notices BP and/or pulse are elevated.  Will start Adhansia 55 mg po q am for ADD. Continue Sertraline 200 mg po qd for depression and anxiety.  Continue Horizant 600 mg po q evening for RLS and insomnia.  Social phobia - Plan: Brexpiprazole (REXULTI)  0.5 MG TABS  Major depressive disorder, recurrent episode, moderate (HCC) - Plan: Brexpiprazole (REXULTI) 0.5 MG TABS  Attention deficit hyperactivity disorder (ADHD), predominantly inattentive type - Plan: Methylphenidate HCl ER (ADHANSIA  XR) 55 MG CP24  Please see After Visit Summary for patient specific instructions.  Future Appointments  Date Time Provider Department Center  06/18/2018 11:30 AM Corie Chiquito, PMHNP [facility] None    No orders of the defined types were placed in this encounter.     -------------------------------

## 2018-05-17 ENCOUNTER — Encounter: Payer: Self-pay | Admitting: Psychiatry

## 2018-05-19 DIAGNOSIS — F4323 Adjustment disorder with mixed anxiety and depressed mood: Secondary | ICD-10-CM | POA: Diagnosis not present

## 2018-05-27 DIAGNOSIS — F4323 Adjustment disorder with mixed anxiety and depressed mood: Secondary | ICD-10-CM | POA: Diagnosis not present

## 2018-05-28 DIAGNOSIS — F4323 Adjustment disorder with mixed anxiety and depressed mood: Secondary | ICD-10-CM | POA: Diagnosis not present

## 2018-05-30 ENCOUNTER — Telehealth: Payer: Self-pay

## 2018-05-30 NOTE — Telephone Encounter (Signed)
Prior Auth approved by [facility] for Adhansia XR 55 Mg or CP24   05/29/2018-05/27/2021.

## 2018-06-03 DIAGNOSIS — F4323 Adjustment disorder with mixed anxiety and depressed mood: Secondary | ICD-10-CM | POA: Diagnosis not present

## 2018-06-04 ENCOUNTER — Encounter: Payer: Self-pay | Admitting: Psychiatry

## 2018-06-11 DIAGNOSIS — F4323 Adjustment disorder with mixed anxiety and depressed mood: Secondary | ICD-10-CM | POA: Diagnosis not present

## 2018-06-18 ENCOUNTER — Encounter: Payer: Self-pay | Admitting: Psychiatry

## 2018-06-18 ENCOUNTER — Ambulatory Visit (INDEPENDENT_AMBULATORY_CARE_PROVIDER_SITE_OTHER): Payer: BLUE CROSS/BLUE SHIELD | Admitting: Psychiatry

## 2018-06-18 ENCOUNTER — Other Ambulatory Visit: Payer: Self-pay

## 2018-06-18 VITALS — BP 118/81 | Temp 83.0°F

## 2018-06-18 DIAGNOSIS — F900 Attention-deficit hyperactivity disorder, predominantly inattentive type: Secondary | ICD-10-CM

## 2018-06-18 DIAGNOSIS — F331 Major depressive disorder, recurrent, moderate: Secondary | ICD-10-CM

## 2018-06-18 DIAGNOSIS — F4323 Adjustment disorder with mixed anxiety and depressed mood: Secondary | ICD-10-CM | POA: Diagnosis not present

## 2018-06-18 DIAGNOSIS — G4700 Insomnia, unspecified: Secondary | ICD-10-CM

## 2018-06-18 DIAGNOSIS — G2581 Restless legs syndrome: Secondary | ICD-10-CM

## 2018-06-18 DIAGNOSIS — F4010 Social phobia, unspecified: Secondary | ICD-10-CM

## 2018-06-18 NOTE — Progress Notes (Signed)
Margaret Golden [id number] 09-15-1990 28 y.o.  Subjective:   Patient ID:  Margaret Golden is a 28 y.o. (DOB [birth year]) female.  Chief Complaint:  Chief Complaint  Patient presents with  . Depression  . Anxiety  . ADD    HPI Margaret Golden presents to the office today for follow-up of depression, anxiety and ADD. She reports that her paternal grandmother died in late 06-17-22 and father has been in [place] to attend funeral and handle affairs. She reports that it has been "an emotional roller coaster." She reports that her father has said he would continue to support her financially if she does not find a job immediately. She reports that her anxiety has been elevated. Reports some persistent depression- "to some degree it's always in the background" and at times depression is worse. She reports occasional crying episodes and that it has improved over the last few days since she has been able "to sit and come to terms with things." Sleep has been off since 2 day migraine. Some difficulty with sleep initiation. Reports sleeping no more than 10 hours in a 24-hour period. Reports energy has been decreased and has been staying in bed more and has been on her computer less. Motivation has been low. Reports motivation was improving and then decreased after stressors. Appetite has been "up and down with increased depression. Reports concentration and focus have improved in the last few days and is making plans. Reports that she had one incident where she had SI without plan after argument with her father and immediately reached out to friends and therapist for help. Denies SI.   Reports that she has not been taking Adhansia or Rexulti consistently. Reports that she has also been less consistent with her routine meds since grandmother's death.   Reports that she plans to make her home seem "more like home" and more comfortable.   2022-06-17 was anniversary of mother's death and grandmother died  this 06/17/22.   Past medication trials: Sertraline-helpful for depression and anxiety Wellbutrin Lexapro-ineffective Cymbalta Pristiq Seroquel- does not recall any significant improvement Latuda Abilify Propanolol Melatonin Vyvanse Adderall XR- not effective Concerta- Notices duration only lasts about 4 hours. No significant improvement with doses less than 54 mg.  Horizant Lamictal-effective for mood Trazodone-effective for insomnia but causes some excessive drowsiness   Review of Systems:  Review of Systems  Cardiovascular: Negative for palpitations.  Musculoskeletal: Positive for arthralgias. Negative for gait problem.  Allergic/Immunologic: Positive for environmental allergies.  Neurological: Negative for tremors.       Had recent 2-day migraine. Reports that overall HA's are at baseline.   Psychiatric/Behavioral:       Please refer to HPI   She reports that she has periodically checked her BP and did not have any elevated BP's.   Medications: I have reviewed the patient's current medications.  Current Outpatient Medications  Medication Sig Dispense Refill  . aspirin-acetaminophen-caffeine (EXCEDRIN MIGRAINE) 250-250-65 MG tablet Take by mouth every 6 (six) hours as needed for headache.    . Brexpiprazole (REXULTI) 1 MG TABS Take by mouth.    . Gabapentin Enacarbil (HORIZANT) 600 MG TBCR Take 600 mg by mouth daily.    Marland Kitchen lamoTRIgine (LAMICTAL) 200 MG tablet Take 1 tablet (200 mg total) by mouth every morning. 90 tablet 1  . levothyroxine (SYNTHROID) 175 MCG tablet Take 1 tablet (175 mcg total) by mouth daily before breakfast. (Patient taking differently: Take 150 mcg by mouth daily before breakfast. ) 30 tablet  5  . Multiple Vitamins-Minerals (MULTIVITAMIN WITH MINERALS) tablet Take 1 tablet by mouth daily.    Marland Kitchen omeprazole (PRILOSEC) 20 MG capsule Take 2 capsules (40 mg total) by mouth daily. 60 capsule 5  . Ferrous Sulfate (IRON) 28 MG TABS Take 2 tablets by mouth  daily.    . Methylphenidate HCl ER (ADHANSIA XR) 55 MG CP24 Take 55 mg by mouth every morning for 30 days. 30 capsule 0  . sertraline (ZOLOFT) 100 MG tablet Take 2 tablets (200 mg total) by mouth daily. 180 tablet 1   No current facility-administered medications for this visit.     Medication Side Effects: None  Allergies:  Allergies  Allergen Reactions  . Augmentin [Amoxicillin-Pot Clavulanate]     hives    Past Medical History:  Diagnosis Date  . ADD (attention deficit disorder)   . Anxiety   . Depression   . Fatty liver   . History of chicken pox   . History of kidney stones   . RLS (restless legs syndrome)   . Thyroid disease    hypothyroid    Family History  Problem Relation Age of Onset  . Depression Mother   . Depression Father   . Cancer Paternal Aunt        colon  . Cancer Paternal Uncle        "small cell cancer"  . Diabetes Maternal Grandfather   . Arthritis Maternal Grandfather   . Arthritis Maternal Grandmother   . Breast cancer Maternal Grandmother   . Arthritis Paternal Grandmother   . Arthritis Paternal Grandfather     Social History   Socioeconomic History  . Marital status: Single    Spouse name: Not on file  . Number of children: Not on file  . Years of education: 65  . Highest education level: Not on file  Occupational History  . Not on file  Social Needs  . Financial resource strain: Not on file  . Food insecurity:    Worry: Not on file    Inability: Not on file  . Transportation needs:    Medical: Not on file    Non-medical: Not on file  Tobacco Use  . Smoking status: Never Smoker  . Smokeless tobacco: Never Used  Substance and Sexual Activity  . Alcohol use: Never    Alcohol/week: 0.0 standard drinks    Frequency: Never  . Drug use: No  . Sexual activity: Never  Lifestyle  . Physical activity:    Days per week: Not on file    Minutes per session: Not on file  . Stress: Not on file  Relationships  . Social  connections:    Talks on phone: Not on file    Gets together: Not on file    Attends religious service: Not on file    Active member of club or organization: Not on file    Attends meetings of clubs or organizations: Not on file    Relationship status: Not on file  . Intimate partner violence:    Fear of current or ex partner: Not on file    Emotionally abused: Not on file    Physically abused: Not on file    Forced sexual activity: Not on file  Other Topics Concern  . Not on file  Social History Narrative   Unemployed   Lives with Dad   Seeking work   Only child   Dog 3   Lives in a one story house with father, has  a cat-12/26/16-sjb    Past Medical History, Surgical history, Social history, and Family history were reviewed and updated as appropriate.   Please see review of systems for further details on the patient's review from today.   Objective:   Physical Exam:  BP 118/81   Temp (!) 83 F (28.3 C)   Physical Exam Constitutional:      General: She is not in acute distress.    Appearance: She is well-developed.  Musculoskeletal:        General: No deformity.  Neurological:     Mental Status: She is alert and oriented to person, place, and time.     Coordination: Coordination normal.  Psychiatric:        Attention and Perception: Attention and perception normal. She does not perceive auditory or visual hallucinations.        Mood and Affect: Mood is depressed. Mood is not anxious. Affect is not labile, blunt, angry or inappropriate.        Speech: Speech normal.        Behavior: Behavior normal.        Thought Content: Thought content normal. Thought content does not include homicidal or suicidal ideation. Thought content does not include homicidal or suicidal plan.        Cognition and Memory: Cognition and memory normal.        Judgment: Judgment normal.     Comments: Insight intact. No delusions.      Lab Review:     Component Value Date/Time   NA 138  02/19/2018 1214   K 3.6 02/19/2018 1214   CL 104 02/19/2018 1214   CO2 25 02/19/2018 1214   GLUCOSE 111 (H) 02/19/2018 1214   BUN 10 02/19/2018 1214   CREATININE 0.71 02/19/2018 1214   CREATININE 0.75 07/06/2016 1534   CALCIUM 8.4 02/19/2018 1214   PROT 8.1 07/26/2017 1014   ALBUMIN 4.5 07/26/2017 1014   AST 33 07/26/2017 1014   ALT 21 07/26/2017 1014   ALKPHOS 82 07/26/2017 1014   BILITOT 0.5 07/26/2017 1014       Component Value Date/Time   WBC 9.8 05/02/2018 1533   RBC 5.38 (H) 05/02/2018 1533   HGB 12.1 05/02/2018 1533   HCT 39.5 05/02/2018 1533   PLT 257 05/02/2018 1533   MCV 73.4 (L) 05/02/2018 1533   MCH 22.5 (L) 05/02/2018 1533   MCHC 30.6 (L) 05/02/2018 1533   RDW 14.0 05/02/2018 1533   LYMPHSABS 2,656 05/02/2018 1533   MONOABS 0.4 08/06/2017 1359   EOSABS 167 05/02/2018 1533   BASOSABS 59 05/02/2018 1533    No results found for: POCLITH, LITHIUM   No results found for: PHENYTOIN, PHENOBARB, VALPROATE, CBMZ   .res Assessment: Plan:   Pt seen for 30 minutes and greater than 50% of session spent counseling pt re: strategies to improve medication compliance to include considering getting an accountability partner, associating taking medication with another daily routine, or considering getting a pill box or using pill pack. Pt reports that she would like to resume taking prescribed medications regularly since she reports that medications adequately control her mood and anxiety signs and symptoms when taken regularly.  Encourage patient to resume Rexulti since Rexulti has a long half-life and will continue to offer some benefit for her mood and anxiety signs and symptoms even if taken inconsistently.  Patient reports that she does not need any medication refills at this time. Will restart Rexulti 0.5 mg daily for 1 week, then increase  to 1 mg daily for anxiety and depression. Continue Horizant 600 mg in the evening for restless legs and insomnia. Continue Lamictal 200  mg daily for mood signs and symptoms.  Discussed contacting office if she ever misses more than 5 consecutive days so that Lamictal can be titrated instead of resuming 200 mg dose.  Patient reports that she has not missed more than a few days of Lamictal. Continue sertraline 200 mg daily for mood and anxiety. Continue Adhansia XR 55 mg po q am for concentration.  Major depressive disorder, recurrent episode, moderate (HCC)  Social phobia  Attention deficit hyperactivity disorder (ADHD), predominantly inattentive type  Insomnia, unspecified type  RLS (restless legs syndrome)  Please see After Visit Summary for patient specific instructions.  Future Appointments  Date Time Provider Department Center  07/24/2018 10:00 AM Corie Chiquitoarter, Luciano Cinquemani, PMHNP [facility] None    No orders of the defined types were placed in this encounter.     -------------------------------

## 2018-06-20 DIAGNOSIS — F4323 Adjustment disorder with mixed anxiety and depressed mood: Secondary | ICD-10-CM | POA: Diagnosis not present

## 2018-06-23 DIAGNOSIS — F4323 Adjustment disorder with mixed anxiety and depressed mood: Secondary | ICD-10-CM | POA: Diagnosis not present

## 2018-06-30 DIAGNOSIS — F4323 Adjustment disorder with mixed anxiety and depressed mood: Secondary | ICD-10-CM | POA: Diagnosis not present

## 2018-07-07 DIAGNOSIS — F4323 Adjustment disorder with mixed anxiety and depressed mood: Secondary | ICD-10-CM | POA: Diagnosis not present

## 2018-07-14 DIAGNOSIS — F4323 Adjustment disorder with mixed anxiety and depressed mood: Secondary | ICD-10-CM | POA: Diagnosis not present

## 2018-07-21 DIAGNOSIS — F4323 Adjustment disorder with mixed anxiety and depressed mood: Secondary | ICD-10-CM | POA: Diagnosis not present

## 2018-07-24 ENCOUNTER — Other Ambulatory Visit: Payer: Self-pay

## 2018-07-24 ENCOUNTER — Encounter: Payer: Self-pay | Admitting: Psychiatry

## 2018-07-24 ENCOUNTER — Ambulatory Visit (INDEPENDENT_AMBULATORY_CARE_PROVIDER_SITE_OTHER): Payer: BLUE CROSS/BLUE SHIELD | Admitting: Psychiatry

## 2018-07-24 DIAGNOSIS — F330 Major depressive disorder, recurrent, mild: Secondary | ICD-10-CM | POA: Diagnosis not present

## 2018-07-24 DIAGNOSIS — F4010 Social phobia, unspecified: Secondary | ICD-10-CM | POA: Diagnosis not present

## 2018-07-24 DIAGNOSIS — G4700 Insomnia, unspecified: Secondary | ICD-10-CM

## 2018-07-24 MED ORDER — SERTRALINE HCL 100 MG PO TABS: 200.0000 mg | ORAL_TABLET | Freq: Every day | ORAL | 1 refills | Status: DC

## 2018-07-24 MED ORDER — LAMOTRIGINE 200 MG PO TABS: 200.0000 mg | ORAL_TABLET | ORAL | 1 refills | Status: DC

## 2018-07-24 NOTE — Progress Notes (Signed)
Margaret Golden [id number] 02-26-1991 27 y.o.  Virtual Visit via Telephone Note  I connected with Margaret Golden on 07/24/18 at 10:00 AM EDT by telephone and verified that I am speaking with the correct person using two identifiers.   I discussed the limitations, risks, security and privacy concerns of performing an evaluation and management service by telephone and the availability of in person appointments. I also discussed with the patient that there may be a patient responsible charge related to this service. The patient expressed understanding and agreed to proceed.  I discussed the assessment and treatment plan with the patient. The patient was provided an opportunity to ask questions and all were answered. The patient agreed with the plan and demonstrated an understanding of the instructions.   The patient was advised to call back or seek an in-person evaluation if the symptoms worsen or if the condition fails to improve as anticipated.  I provided 30 minutes of non-face-to-face time during this encounter.  Patient is located at home.  Provider is located at home.   Margaret Golden, PMHNP  Subjective:   Patient ID:  Margaret Golden is a 28 y.o. (DOB 31-Oct-1990) female.  Chief Complaint:  Chief Complaint  Patient presents with  . Anxiety  . Depression  . Sleeping Problem    HPI Margaret Golden presents for follow-up of depression, anxiety, and h/o insomnia. She reports that she has had some "ups and downs and mostly on the up." She reports some "bouts of depression" and has been able to work through this. She reports some increase in anxiety in response to the pandemic. Denies any recent panic attacks. She reports that she had one incident where her anxiety started to reach panic level and then did not reach level of full panic. She reports that her anxiety has been increased "but I think I am handling it better." She reports that she is learning to re-direct anxiety. She  reports altered sleep wake cycle and has been sleeping during the day and awake at night and is gradually shifting this. She reports that her appetite was increased and is starting to level off again. Energy is ok. Motivation is improving.  She reports that she had some n/v and poor concentration with medications and that these side effects resolved when stopping medication.   She reports that her concentration improved when she stopped these medications and concentration has been "jumpy." Denies SI.   She reports that her father has been home and they are able to have a more consistent schedule. She reports that she has been able to have some good discussions with her father. Has been able to see her therapist in person.  Looking forward to friend coming to visit in July.   Has checked BP at home and reports that BP readings were WNL.  Past medication trials: Sertraline-helpful for depression and anxiety Wellbutrin Lexapro-ineffective Cymbalta Pristiq Seroquel- does not recall any significant improvement Latuda Abilify Propanolol Melatonin Vyvanse Adderall XR- not effective Concerta- Notices duration only lasts about 4 hours. No significant improvement with doses less than 54 mg.  Horizant Lamictal-effective for mood Trazodone-effective for insomnia but causes some excessive drowsiness Rexulti- May have caused n/v and poor concentration Adhansia- May have caused n/v and poor concentration   Review of Systems:  Review of Systems  Musculoskeletal: Negative for gait problem.  Allergic/Immunologic: Positive for environmental allergies.  Neurological: Positive for headaches. Negative for tremors.  Psychiatric/Behavioral:       Please refer  to HPI    Medications: I have reviewed the patient's current medications.  Current Outpatient Medications  Medication Sig Dispense Refill  . aspirin-acetaminophen-caffeine (EXCEDRIN MIGRAINE) 250-250-65 MG tablet Take by mouth every 6  (six) hours as needed for headache.    . Ferrous Sulfate (IRON) 28 MG TABS Take 2 tablets by mouth daily.    . Gabapentin Enacarbil (HORIZANT) 600 MG TBCR Take 600 mg by mouth daily.    Marland Kitchen lamoTRIgine (LAMICTAL) 200 MG tablet Take 1 tablet (200 mg total) by mouth every morning. 90 tablet 1  . levothyroxine (SYNTHROID) 175 MCG tablet Take 1 tablet (175 mcg total) by mouth daily before breakfast. (Patient taking differently: Take 150 mcg by mouth daily before breakfast. ) 30 tablet 5  . Multiple Vitamins-Minerals (MULTIVITAMIN WITH MINERALS) tablet Take 1 tablet by mouth daily.    Marland Kitchen omeprazole (PRILOSEC) 20 MG capsule Take 2 capsules (40 mg total) by mouth daily. 60 capsule 5  . sertraline (ZOLOFT) 100 MG tablet Take 2 tablets (200 mg total) by mouth daily. 180 tablet 1   No current facility-administered medications for this visit.     Medication Side Effects: Nausea and Other: Had n/v and poor concentration which resolved with stopping Adhansia and Rexulti  Allergies:  Allergies  Allergen Reactions  . Augmentin [Amoxicillin-Pot Clavulanate]     hives    Past Medical History:  Diagnosis Date  . ADD (attention deficit disorder)   . Anxiety   . Depression   . Fatty liver   . History of chicken pox   . History of kidney stones   . RLS (restless legs syndrome)   . Thyroid disease    hypothyroid    Family History  Problem Relation Age of Onset  . Depression Mother   . Depression Father   . Cancer Paternal Aunt        colon  . Cancer Paternal Uncle        "small cell cancer"  . Diabetes Maternal Grandfather   . Arthritis Maternal Grandfather   . Arthritis Maternal Grandmother   . Breast cancer Maternal Grandmother   . Arthritis Paternal Grandmother   . Arthritis Paternal Grandfather     Social History   Socioeconomic History  . Marital status: Single    Spouse name: Not on file  . Number of children: Not on file  . Years of education: 28  . Highest education level: Not  on file  Occupational History  . Not on file  Social Needs  . Financial resource strain: Not on file  . Food insecurity:    Worry: Not on file    Inability: Not on file  . Transportation needs:    Medical: Not on file    Non-medical: Not on file  Tobacco Use  . Smoking status: Never Smoker  . Smokeless tobacco: Never Used  Substance and Sexual Activity  . Alcohol use: Never    Alcohol/week: 0.0 standard drinks    Frequency: Never  . Drug use: No  . Sexual activity: Never  Lifestyle  . Physical activity:    Days per week: Not on file    Minutes per session: Not on file  . Stress: Not on file  Relationships  . Social connections:    Talks on phone: Not on file    Gets together: Not on file    Attends religious service: Not on file    Active member of club or organization: Not on file    Attends  meetings of clubs or organizations: Not on file    Relationship status: Not on file  . Intimate partner violence:    Fear of current or ex partner: Not on file    Emotionally abused: Not on file    Physically abused: Not on file    Forced sexual activity: Not on file  Other Topics Concern  . Not on file  Social History Narrative   Unemployed   Lives with Dad   Seeking work   Only child   Dog 3   Lives in a one story house with father, has a cat-12/26/16-sjb    Past Medical History, Surgical history, Social history, and Family history were reviewed and updated as appropriate.   Please see review of systems for further details on the patient's review from today.   Objective:   Physical Exam:  There were no vitals taken for this visit.  Physical Exam Neurological:     Mental Status: She is alert and oriented to person, place, and time.     Cranial Nerves: No dysarthria.  Psychiatric:        Attention and Perception: Attention normal.        Mood and Affect: Mood is anxious.        Speech: Speech normal.        Behavior: Behavior is cooperative.        Thought  Content: Thought content normal. Thought content is not paranoid or delusional. Thought content does not include homicidal or suicidal ideation. Thought content does not include homicidal or suicidal plan.        Cognition and Memory: Cognition and memory normal.        Judgment: Judgment normal.     Lab Review:     Component Value Date/Time   NA 138 02/19/2018 1214   K 3.6 02/19/2018 1214   CL 104 02/19/2018 1214   CO2 25 02/19/2018 1214   GLUCOSE 111 (H) 02/19/2018 1214   BUN 10 02/19/2018 1214   CREATININE 0.71 02/19/2018 1214   CREATININE 0.75 07/06/2016 1534   CALCIUM 8.4 02/19/2018 1214   PROT 8.1 07/26/2017 1014   ALBUMIN 4.5 07/26/2017 1014   AST 33 07/26/2017 1014   ALT 21 07/26/2017 1014   ALKPHOS 82 07/26/2017 1014   BILITOT 0.5 07/26/2017 1014       Component Value Date/Time   WBC 9.8 05/02/2018 1533   RBC 5.38 (H) 05/02/2018 1533   HGB 12.1 05/02/2018 1533   HCT 39.5 05/02/2018 1533   PLT 257 05/02/2018 1533   MCV 73.4 (L) 05/02/2018 1533   MCH 22.5 (L) 05/02/2018 1533   MCHC 30.6 (L) 05/02/2018 1533   RDW 14.0 05/02/2018 1533   LYMPHSABS 2,656 05/02/2018 1533   MONOABS 0.4 08/06/2017 1359   EOSABS 167 05/02/2018 1533   BASOSABS 59 05/02/2018 1533    No results found for: POCLITH, LITHIUM   No results found for: PHENYTOIN, PHENOBARB, VALPROATE, CBMZ   .res Assessment: Plan:   Will discontinue Rexulti and Adhansia due to possible adverse effects. Continue sertraline 200 mg daily for depression and anxiety. Continue Lamictal 200 mg daily for mood stabilization. Continue Horizant 600 mg daily for restless legs. Recommend continuing therapy with Tina Griffiths, LPC. Will follow-up with this provider in 4 weeks or sooner if clinically indicated.  Social phobia  Insomnia, unspecified type  Major depressive disorder, recurrent episode, mild (HCC) - Plan: lamoTRIgine (LAMICTAL) 200 MG tablet, sertraline (ZOLOFT) 100 MG tablet  Please see After  Visit  Summary for patient specific instructions.  No future appointments.  No orders of the defined types were placed in this encounter.     -------------------------------

## 2018-07-28 DIAGNOSIS — F4323 Adjustment disorder with mixed anxiety and depressed mood: Secondary | ICD-10-CM | POA: Diagnosis not present

## 2018-08-04 DIAGNOSIS — F4323 Adjustment disorder with mixed anxiety and depressed mood: Secondary | ICD-10-CM | POA: Diagnosis not present

## 2018-08-12 DIAGNOSIS — F4323 Adjustment disorder with mixed anxiety and depressed mood: Secondary | ICD-10-CM | POA: Diagnosis not present

## 2018-08-28 DIAGNOSIS — F4323 Adjustment disorder with mixed anxiety and depressed mood: Secondary | ICD-10-CM | POA: Diagnosis not present

## 2018-09-04 DIAGNOSIS — F4323 Adjustment disorder with mixed anxiety and depressed mood: Secondary | ICD-10-CM | POA: Diagnosis not present

## 2018-09-09 DIAGNOSIS — F4323 Adjustment disorder with mixed anxiety and depressed mood: Secondary | ICD-10-CM | POA: Diagnosis not present

## 2018-09-10 DIAGNOSIS — F4323 Adjustment disorder with mixed anxiety and depressed mood: Secondary | ICD-10-CM | POA: Diagnosis not present

## 2018-09-16 ENCOUNTER — Encounter: Payer: Self-pay | Admitting: Family

## 2018-09-16 ENCOUNTER — Other Ambulatory Visit: Payer: Self-pay

## 2018-09-16 ENCOUNTER — Ambulatory Visit (INDEPENDENT_AMBULATORY_CARE_PROVIDER_SITE_OTHER): Payer: BC Managed Care – PPO | Admitting: Family

## 2018-09-16 ENCOUNTER — Telehealth: Payer: Self-pay | Admitting: Family

## 2018-09-16 ENCOUNTER — Telehealth: Payer: Self-pay | Admitting: *Deleted

## 2018-09-16 DIAGNOSIS — Z20822 Contact with and (suspected) exposure to covid-19: Secondary | ICD-10-CM

## 2018-09-16 DIAGNOSIS — Z20828 Contact with and (suspected) exposure to other viral communicable diseases: Secondary | ICD-10-CM | POA: Diagnosis not present

## 2018-09-16 DIAGNOSIS — B349 Viral infection, unspecified: Secondary | ICD-10-CM

## 2018-09-16 NOTE — Telephone Encounter (Signed)
Patient called and left message to return call to have testing scheduled.

## 2018-09-16 NOTE — Progress Notes (Signed)
Virtual Visit via Video Note  I connected with Margaret Golden on 09/16/18 at  4:00 PM EDT by a video enabled telemedicine application and verified that I am speaking with the correct person using two identifiers.  Location: Patient: home Provider: office   I discussed the limitations of evaluation and management by telemedicine and the availability of in person appointments. The patient expressed understanding and agreed to proceed.  History of Present Illness:  Patient is a 28 yr old female who presents today with chief complaint of voice hoarseness. Reports sore throat/hoarseness/stomach cramping and diarrhea. She reports that symptoms started last Wednesday or Thursday. She denies known sick contacts.  She leaves the house once a week.  She has had several visitors come over to help with the house. She denies fever.  Intermittent headache.  + cough on/off but worse at night.  Denies SOB.    No alleviating measures.     Past Medical History:  Diagnosis Date  . ADD (attention deficit disorder)   . Anxiety   . Depression   . Fatty liver   . History of chicken pox   . History of kidney stones   . RLS (restless legs syndrome)   . Thyroid disease    hypothyroid     Social History   Socioeconomic History  . Marital status: Single    Spouse name: Not on file  . Number of children: Not on file  . Years of education: 4512  . Highest education level: Not on file  Occupational History  . Not on file  Social Needs  . Financial resource strain: Not on file  . Food insecurity    Worry: Not on file    Inability: Not on file  . Transportation needs    Medical: Not on file    Non-medical: Not on file  Tobacco Use  . Smoking status: Never Smoker  . Smokeless tobacco: Never Used  Substance and Sexual Activity  . Alcohol use: Never    Alcohol/week: 0.0 standard drinks    Frequency: Never  . Drug use: No  . Sexual activity: Never  Lifestyle  . Physical activity    Days per  week: Not on file    Minutes per session: Not on file  . Stress: Not on file  Relationships  . Social Musicianconnections    Talks on phone: Not on file    Gets together: Not on file    Attends religious service: Not on file    Active member of club or organization: Not on file    Attends meetings of clubs or organizations: Not on file    Relationship status: Not on file  . Intimate partner violence    Fear of current or ex partner: Not on file    Emotionally abused: Not on file    Physically abused: Not on file    Forced sexual activity: Not on file  Other Topics Concern  . Not on file  Social History Narrative   Unemployed   Lives with Dad   Seeking work   Only child   Dog 3   Lives in a one story house with father, has a cat-12/26/16-sjb    Past Surgical History:  Procedure Laterality Date  . KNEE ARTHROSCOPY Right 2007  . TONSILLECTOMY  2010  . WISDOM TOOTH EXTRACTION      Family History  Problem Relation Age of Onset  . Depression Mother   . Depression Father   . Cancer Paternal Aunt  colon  . Cancer Paternal Uncle        "small cell cancer"  . Diabetes Maternal Grandfather   . Arthritis Maternal Grandfather   . Arthritis Maternal Grandmother   . Breast cancer Maternal Grandmother   . Arthritis Paternal Grandmother   . Arthritis Paternal Grandfather     Allergies  Allergen Reactions  . Augmentin [Amoxicillin-Pot Clavulanate]     hives    Current Outpatient Medications on File Prior to Visit  Medication Sig Dispense Refill  . aspirin-acetaminophen-caffeine (EXCEDRIN MIGRAINE) 250-250-65 MG tablet Take by mouth every 6 (six) hours as needed for headache.    . Ferrous Sulfate (IRON) 28 MG TABS Take 2 tablets by mouth daily.    . Gabapentin Enacarbil (HORIZANT) 600 MG TBCR Take 600 mg by mouth daily.    Marland Kitchen lamoTRIgine (LAMICTAL) 200 MG tablet Take 1 tablet (200 mg total) by mouth every morning. 90 tablet 1  . levothyroxine (SYNTHROID) 175 MCG tablet Take 1  tablet (175 mcg total) by mouth daily before breakfast. (Patient taking differently: Take 150 mcg by mouth daily before breakfast. ) 30 tablet 5  . Multiple Vitamins-Minerals (MULTIVITAMIN WITH MINERALS) tablet Take 1 tablet by mouth daily.    Marland Kitchen omeprazole (PRILOSEC) 20 MG capsule Take 2 capsules (40 mg total) by mouth daily. 60 capsule 5  . sertraline (ZOLOFT) 100 MG tablet Take 2 tablets (200 mg total) by mouth daily. 180 tablet 1   No current facility-administered medications on file prior to visit.     There were no vitals taken for this visit.   Observations/Objective:   Gen: Awake, alert, no acute distress Resp: Breathing is even and non-labored ENT: mild voice hoarseness Psych: calm/pleasant demeanor Neuro: Alert and Oriented x 3, + facial symmetry, speech is clear.  Assessment and Plan:  1) Acute viral illness- need to rule out COVID-19. Will arrange testing tomorrow.  Advised pt on supportive measures, rest, hydration, need to self quarantine until we rule out COVID.  Advised to go to ER if she develops severe cough, shortness of breath or fever >101.5. Pt verbalizes understanding.  Follow Up Instructions:    I discussed the assessment and treatment plan with the patient. The patient was provided an opportunity to ask questions and all were answered. The patient agreed with the plan and demonstrated an understanding of the instructions.   The patient was advised to call back or seek an in-person evaluation if the symptoms worsen or if the condition fails to improve as anticipated.  Nance Pear, NP

## 2018-09-16 NOTE — Telephone Encounter (Signed)
Patient with 4 day history of cough, sore throat, headache, diarrhea.  Needs COVID-19 testing please.     Requested by Elby Beck, PA at [facility] practice.  Pt scheduled for tomorrow at [facility] site.  Testing process reviewed, verbalizes understanding.

## 2018-09-16 NOTE — Telephone Encounter (Signed)
Patient with 4 day history of cough, sore throat, headache, diarrhea.  Needs COVID-19 testing please.

## 2018-09-16 NOTE — Telephone Encounter (Signed)
Patient with 4 day history of cough, sore throat, headache, diarrhea.  Needs COVID-19 testing please. Pt referred for testing by [facility].   Pt called and left message to return call to be scheduled for testing.

## 2018-09-17 ENCOUNTER — Other Ambulatory Visit: Payer: BC Managed Care – PPO

## 2018-09-17 DIAGNOSIS — R6889 Other general symptoms and signs: Secondary | ICD-10-CM | POA: Diagnosis not present

## 2018-09-17 DIAGNOSIS — F4323 Adjustment disorder with mixed anxiety and depressed mood: Secondary | ICD-10-CM | POA: Diagnosis not present

## 2018-09-17 DIAGNOSIS — Z20822 Contact with and (suspected) exposure to covid-19: Secondary | ICD-10-CM

## 2018-09-20 LAB — NOVEL CORONAVIRUS, NAA: SARS-CoV-2, NAA: NOT DETECTED

## 2018-10-01 DIAGNOSIS — F4323 Adjustment disorder with mixed anxiety and depressed mood: Secondary | ICD-10-CM | POA: Diagnosis not present

## 2018-10-08 DIAGNOSIS — F4323 Adjustment disorder with mixed anxiety and depressed mood: Secondary | ICD-10-CM | POA: Diagnosis not present

## 2018-10-16 DIAGNOSIS — F4323 Adjustment disorder with mixed anxiety and depressed mood: Secondary | ICD-10-CM | POA: Diagnosis not present

## 2018-10-23 DIAGNOSIS — F4323 Adjustment disorder with mixed anxiety and depressed mood: Secondary | ICD-10-CM | POA: Diagnosis not present

## 2018-10-27 DIAGNOSIS — F4323 Adjustment disorder with mixed anxiety and depressed mood: Secondary | ICD-10-CM | POA: Diagnosis not present

## 2018-11-03 DIAGNOSIS — F4323 Adjustment disorder with mixed anxiety and depressed mood: Secondary | ICD-10-CM | POA: Diagnosis not present

## 2018-11-07 ENCOUNTER — Other Ambulatory Visit: Payer: Self-pay

## 2018-11-07 ENCOUNTER — Ambulatory Visit (INDEPENDENT_AMBULATORY_CARE_PROVIDER_SITE_OTHER): Payer: BC Managed Care – PPO | Admitting: Family Medicine

## 2018-11-07 ENCOUNTER — Encounter: Payer: Self-pay | Admitting: Family Medicine

## 2018-11-07 DIAGNOSIS — K529 Noninfective gastroenteritis and colitis, unspecified: Secondary | ICD-10-CM | POA: Diagnosis not present

## 2018-11-07 DIAGNOSIS — Z20822 Contact with and (suspected) exposure to covid-19: Secondary | ICD-10-CM

## 2018-11-07 MED ORDER — DICYCLOMINE HCL 10 MG PO CAPS: 10.0000 mg | ORAL_CAPSULE | Freq: Three times a day (TID) | ORAL | 0 refills | Status: DC

## 2018-11-07 MED ORDER — ONDANSETRON HCL 4 MG PO TABS: 4.0000 mg | ORAL_TABLET | Freq: Three times a day (TID) | ORAL | 0 refills | Status: DC | PRN

## 2018-11-07 NOTE — Addendum Note (Signed)
Addended by: Sharon Seller B on: 11/07/2018 07:56 AM   Modules accepted: Orders

## 2018-11-07 NOTE — Progress Notes (Signed)
CC: nausea  Subjective Margaret Golden is a 28 y.o. female who presents with nausea and diarrhea. Due to COVID-19 pandemic, we are interacting via web portal for an electronic face-to-face visit. I verified patient's ID using 2 identifiers. Patient agreed to proceed with visit via this method. Patient is at home, I am at office. Patient and I are present for visit.  Symptoms began 1 week ago Patient has abdominal pain, diarrhea, chills, myalgias, nausea and sob Patient denies vomiting, fever, cough and URI symptoms Evaluation to date: no Sick contacts: none known  Past Medical History:  Diagnosis Date  . ADD (attention deficit disorder)   . Anxiety   . Depression   . Fatty liver   . History of chicken pox   . History of kidney stones   . RLS (restless legs syndrome)   . Thyroid disease    hypothyroid   Past Surgical History:  Procedure Laterality Date  . KNEE ARTHROSCOPY Right 2007  . TONSILLECTOMY  2010  . WISDOM TOOTH EXTRACTION     Review of Systems Gastrointestinal:  As noted in the HPI  Exam No conversational dyspnea Age appropriate judgment and insight Nml affect and mood  Assessment and Plan  Gastroenteritis - Plan: ondansetron (ZOFRAN) 4 MG tablet, dicyclomine (BENTYL) 10 MG capsule  Letter for work given.  Avoid aggravating foods, push fluids w electrolytes. Ck COVID.  F/u if symptoms fail to improve, sooner if worsening. The patient voiced understanding and agreement to the plan.  [place], DO 11/07/18  7:42 AM

## 2018-11-09 LAB — NOVEL CORONAVIRUS, NAA: SARS-CoV-2, NAA: NOT DETECTED

## 2018-11-10 ENCOUNTER — Encounter: Payer: Self-pay | Admitting: Family Medicine

## 2018-11-10 ENCOUNTER — Other Ambulatory Visit: Payer: Self-pay | Admitting: Family Medicine

## 2018-11-10 DIAGNOSIS — F4323 Adjustment disorder with mixed anxiety and depressed mood: Secondary | ICD-10-CM | POA: Diagnosis not present

## 2018-11-10 MED ORDER — PREDNISONE 20 MG PO TABS: 40.0000 mg | ORAL_TABLET | Freq: Every day | ORAL | 0 refills | Status: AC

## 2018-11-17 DIAGNOSIS — F4323 Adjustment disorder with mixed anxiety and depressed mood: Secondary | ICD-10-CM | POA: Diagnosis not present

## 2018-11-24 ENCOUNTER — Other Ambulatory Visit: Payer: Self-pay

## 2018-11-24 ENCOUNTER — Ambulatory Visit: Payer: BC Managed Care – PPO | Admitting: Family

## 2018-11-24 DIAGNOSIS — F4323 Adjustment disorder with mixed anxiety and depressed mood: Secondary | ICD-10-CM | POA: Diagnosis not present

## 2018-11-25 ENCOUNTER — Ambulatory Visit: Payer: BC Managed Care – PPO | Admitting: Family

## 2018-11-26 ENCOUNTER — Ambulatory Visit: Payer: BC Managed Care – PPO | Admitting: Family

## 2018-12-01 DIAGNOSIS — F4323 Adjustment disorder with mixed anxiety and depressed mood: Secondary | ICD-10-CM | POA: Diagnosis not present

## 2018-12-02 ENCOUNTER — Other Ambulatory Visit: Payer: Self-pay

## 2018-12-02 ENCOUNTER — Encounter: Payer: Self-pay | Admitting: Psychiatry

## 2018-12-02 ENCOUNTER — Ambulatory Visit (INDEPENDENT_AMBULATORY_CARE_PROVIDER_SITE_OTHER): Payer: BC Managed Care – PPO | Admitting: Psychiatry

## 2018-12-02 DIAGNOSIS — F330 Major depressive disorder, recurrent, mild: Secondary | ICD-10-CM | POA: Diagnosis not present

## 2018-12-02 DIAGNOSIS — G2581 Restless legs syndrome: Secondary | ICD-10-CM | POA: Diagnosis not present

## 2018-12-02 DIAGNOSIS — G4700 Insomnia, unspecified: Secondary | ICD-10-CM

## 2018-12-02 DIAGNOSIS — F4010 Social phobia, unspecified: Secondary | ICD-10-CM

## 2018-12-02 MED ORDER — LAMOTRIGINE 100 MG PO TABS: 100.0000 mg | ORAL_TABLET | Freq: Every day | ORAL | 0 refills | Status: DC

## 2018-12-02 MED ORDER — HORIZANT 600 MG PO TBCR: 600.0000 mg | EXTENDED_RELEASE_TABLET | Freq: Every evening | ORAL | 5 refills | Status: AC

## 2018-12-02 MED ORDER — LAMOTRIGINE 25 MG PO TABS: ORAL_TABLET | ORAL | 0 refills | Status: DC

## 2018-12-02 MED ORDER — SERTRALINE HCL 100 MG PO TABS: ORAL_TABLET | ORAL | 1 refills | Status: DC

## 2018-12-02 NOTE — Progress Notes (Signed)
Margaret Golden [id number] 1990/08/30 27 y.o.  Subjective:   Patient ID:  Margaret Golden is a 28 y.o. (DOB [birth year]) female.  Chief Complaint:  Chief Complaint  Patient presents with  . Follow-up    h/o Anxiety and Depression    HPI Margaret Golden presents to the office today for follow-up of anxiety, depression, and insomnia. She initially had some anxiety with starting job and this has improved with getting to know co-workers and now looks forward to seeing them. Anxiety is now less. Reports that she notices some depression on Fridays and that this is when she sees medical provider at work. Also notices some depression when father is out of state. Has some mild persistent depression that is more severe at times. Denies significant irritability. Reports that her sleep has been consistent. She reports that she feels tired in the morning. She reports energy has improved with working. Motivation is "starting to go up." She reports that she has difficulty concentrating when she does not have a specific task at hand. She reports that her appetite has been stable. Has been wanting to try healthier foods.   She reports that she has been off psychiatric medications, possibly for 6-8 weeks.   Now working at an [employer] center. Working 10 hour days, 4 days a week. Has been working for the last 4-5 weeks.  Has been on lighter duty since she sprained her ankle.   Father may be moving to [place] after being let go from his job here.   Past medication trials: Sertraline-helpful for depression and anxiety Wellbutrin Lexapro-ineffective Cymbalta Pristiq Seroquel- does not recall any significant improvement Latuda Abilify Propanolol Melatonin Vyvanse Adderall XR- not effective Concerta- Notices duration only lasts about 4 hours. No significant improvement with doses less than 54 mg. Horizant Lamictal-effective for mood Trazodone-effective for insomnia but causes some excessive  drowsiness Rexulti- May have caused n/v and poor concentration Adhansia- May have caused n/v and poor concentration  Review of Systems:  Review of Systems  Musculoskeletal: Negative for gait problem.       Sprained ankle.  Allergic/Immunologic: Positive for environmental allergies.  Neurological: Negative for tremors.  Psychiatric/Behavioral:       Please refer to HPI    Medications: I have reviewed the patient's current medications.  Current Outpatient Medications  Medication Sig Dispense Refill  . aspirin-acetaminophen-caffeine (EXCEDRIN MIGRAINE) 250-250-65 MG tablet Take by mouth every 6 (six) hours as needed for headache.    . Ferrous Sulfate (IRON) 28 MG TABS Take 2 tablets by mouth daily.    Marland Kitchen levothyroxine (SYNTHROID) 175 MCG tablet Take 1 tablet (175 mcg total) by mouth daily before breakfast. (Patient taking differently: Take 150 mcg by mouth daily before breakfast. ) 30 tablet 5  . Multiple Vitamins-Minerals (MULTIVITAMIN WITH MINERALS) tablet Take 1 tablet by mouth daily.    Marland Kitchen omeprazole (PRILOSEC) 20 MG capsule Take 2 capsules (40 mg total) by mouth daily. 60 capsule 5  . ondansetron (ZOFRAN) 4 MG tablet Take 1 tablet (4 mg total) by mouth every 8 (eight) hours as needed. 20 tablet 0  . dicyclomine (BENTYL) 10 MG capsule Take 1 capsule (10 mg total) by mouth 4 (four) times daily -  before meals and at bedtime. (Patient not taking: Reported on 12/02/2018) 30 capsule 0  . Gabapentin Enacarbil (HORIZANT) 600 MG TBCR Take 1 tablet (600 mg total) by mouth every evening. 30 tablet 5  . [START ON 12/23/2018] lamoTRIgine (LAMICTAL) 100 MG tablet Take 1  tablet (100 mg total) by mouth daily. Take 1 tab po qd x 2 weeks, then 1.5 tabs po qd 45 tablet 0  . lamoTRIgine (LAMICTAL) 25 MG tablet Take 1 tablet (25 mg total) by mouth daily for 14 days, THEN 2 tablets (50 mg total) daily for 14 days. 45 tablet 0  . sertraline (ZOLOFT) 100 MG tablet Take 1/2 tablet daily for 1 week, then increase to  1 tablet daily for 1 week, then 1.5 tabs daily for 1 week, then 2 tablets daily 180 tablet 1   No current facility-administered medications for this visit.     Medication Side Effects: None  Allergies:  Allergies  Allergen Reactions  . Augmentin [Amoxicillin-Pot Clavulanate]     hives    Past Medical History:  Diagnosis Date  . ADD (attention deficit disorder)   . Anxiety   . Depression   . Fatty liver   . History of chicken pox   . History of kidney stones   . RLS (restless legs syndrome)   . Thyroid disease    hypothyroid    Family History  Problem Relation Age of Onset  . Depression Mother   . Depression Father   . Cancer Paternal Aunt        colon  . Cancer Paternal Uncle        "small cell cancer"  . Diabetes Maternal Grandfather   . Arthritis Maternal Grandfather   . Arthritis Maternal Grandmother   . Breast cancer Maternal Grandmother   . Arthritis Paternal Grandmother   . Arthritis Paternal Grandfather     Social History   Socioeconomic History  . Marital status: Single    Spouse name: Not on file  . Number of children: Not on file  . Years of education: 3712  . Highest education level: Not on file  Occupational History  . Not on file  Social Needs  . Financial resource strain: Not on file  . Food insecurity    Worry: Not on file    Inability: Not on file  . Transportation needs    Medical: Not on file    Non-medical: Not on file  Tobacco Use  . Smoking status: Never Smoker  . Smokeless tobacco: Never Used  Substance and Sexual Activity  . Alcohol use: Never    Alcohol/week: 0.0 standard drinks    Frequency: Never  . Drug use: No  . Sexual activity: Never  Lifestyle  . Physical activity    Days per week: Not on file    Minutes per session: Not on file  . Stress: Not on file  Relationships  . Social Musicianconnections    Talks on phone: Not on file    Gets together: Not on file    Attends religious service: Not on file    Active member of  club or organization: Not on file    Attends meetings of clubs or organizations: Not on file    Relationship status: Not on file  . Intimate partner violence    Fear of current or ex partner: Not on file    Emotionally abused: Not on file    Physically abused: Not on file    Forced sexual activity: Not on file  Other Topics Concern  . Not on file  Social History Narrative   Unemployed   Lives with Dad   Seeking work   Only child   Dog 3   Lives in a one story house with father, has a cat-12/26/16-sjb  Past Medical History, Surgical history, Social history, and Family history were reviewed and updated as appropriate.   Please see review of systems for further details on the patient's review from today.   Objective:   Physical Exam:  There were no vitals taken for this visit.  Physical Exam Constitutional:      General: She is not in acute distress.    Appearance: She is well-developed.  Musculoskeletal:        General: No deformity.  Neurological:     Mental Status: She is alert and oriented to person, place, and time.     Coordination: Coordination normal.  Psychiatric:        Attention and Perception: Attention and perception normal. She does not perceive auditory or visual hallucinations.        Mood and Affect: Mood normal. Mood is not anxious or depressed. Affect is not labile, blunt, angry or inappropriate.        Speech: Speech normal.        Behavior: Behavior normal.        Thought Content: Thought content normal. Thought content does not include homicidal or suicidal ideation. Thought content does not include homicidal or suicidal plan.        Cognition and Memory: Cognition and memory normal.        Judgment: Judgment normal.     Comments: Insight intact. No delusions.  Mood is appropriate to content     Lab Review:     Component Value Date/Time   NA 138 02/19/2018 1214   K 3.6 02/19/2018 1214   CL 104 02/19/2018 1214   CO2 25 02/19/2018 1214    GLUCOSE 111 (H) 02/19/2018 1214   BUN 10 02/19/2018 1214   CREATININE 0.71 02/19/2018 1214   CREATININE 0.75 07/06/2016 1534   CALCIUM 8.4 02/19/2018 1214   PROT 8.1 07/26/2017 1014   ALBUMIN 4.5 07/26/2017 1014   AST 33 07/26/2017 1014   ALT 21 07/26/2017 1014   ALKPHOS 82 07/26/2017 1014   BILITOT 0.5 07/26/2017 1014       Component Value Date/Time   WBC 9.8 05/02/2018 1533   RBC 5.38 (H) 05/02/2018 1533   HGB 12.1 05/02/2018 1533   HCT 39.5 05/02/2018 1533   PLT 257 05/02/2018 1533   MCV 73.4 (L) 05/02/2018 1533   MCH 22.5 (L) 05/02/2018 1533   MCHC 30.6 (L) 05/02/2018 1533   RDW 14.0 05/02/2018 1533   LYMPHSABS 2,656 05/02/2018 1533   MONOABS 0.4 08/06/2017 1359   EOSABS 167 05/02/2018 1533   BASOSABS 59 05/02/2018 1533    No results found for: POCLITH, LITHIUM   No results found for: PHENYTOIN, PHENOBARB, VALPROATE, CBMZ   .res Assessment: Plan:   Pt seen for 30 minutes and greater than 50% of session spent counseling pt re: how to re-start medication to minimize risk of side effects. Discussed that Lamictal would need to be re-started at typical starting dose due to length of time she has been off Lamictal and increased risk of SJS with abruptly re-starting Lamictal at higher doses. Discussed that she should monitor for rash and contact office immediately if she develops a rash. Also discussed gradual titration of Sertraline.  Pt to f/u in 2 months or sooner if clinically indicated. Patient advised to contact office with any questions, adverse effects, or acute worsening in signs and symptoms.  Margaret Golden was seen today for follow-up.  Diagnoses and all orders for this visit:  Social phobia -  sertraline (ZOLOFT) 100 MG tablet; Take 1/2 tablet daily for 1 week, then increase to 1 tablet daily for 1 week, then 1.5 tabs daily for 1 week, then 2 tablets daily  Major depressive disorder, recurrent episode, mild (HCC) -     lamoTRIgine (LAMICTAL) 25 MG tablet; Take 1  tablet (25 mg total) by mouth daily for 14 days, THEN 2 tablets (50 mg total) daily for 14 days. -     lamoTRIgine (LAMICTAL) 100 MG tablet; Take 1 tablet (100 mg total) by mouth daily. Take 1 tab po qd x 2 weeks, then 1.5 tabs po qd -     sertraline (ZOLOFT) 100 MG tablet; Take 1/2 tablet daily for 1 week, then increase to 1 tablet daily for 1 week, then 1.5 tabs daily for 1 week, then 2 tablets daily  RLS (restless legs syndrome) -     Gabapentin Enacarbil (HORIZANT) 600 MG TBCR; Take 1 tablet (600 mg total) by mouth every evening.  Insomnia, unspecified type     Please see After Visit Summary for patient specific instructions.  Future Appointments  Date Time Provider Department Center  02/03/2019 10:00 AM Corie Chiquitoarter, Braeleigh Pyper, PMHNP [facility] None    No orders of the defined types were placed in this encounter.   -------------------------------

## 2018-12-03 ENCOUNTER — Telehealth: Payer: Self-pay

## 2018-12-03 NOTE — Telephone Encounter (Signed)
Prior authorization submitted and approved for Horizant 600 mg effective 12/03/2018-12/01/2021 through covermymeds with [employer]

## 2018-12-10 ENCOUNTER — Other Ambulatory Visit: Payer: Self-pay

## 2018-12-12 ENCOUNTER — Other Ambulatory Visit: Payer: Self-pay

## 2018-12-12 ENCOUNTER — Ambulatory Visit (INDEPENDENT_AMBULATORY_CARE_PROVIDER_SITE_OTHER): Payer: BC Managed Care – PPO | Admitting: Family

## 2018-12-12 ENCOUNTER — Ambulatory Visit (HOSPITAL_BASED_OUTPATIENT_CLINIC_OR_DEPARTMENT_OTHER)
Admission: RE | Admit: 2018-12-12 | Discharge: 2018-12-12 | Disposition: A | Discharge status: Home / Self Care | Payer: BC Managed Care – PPO | Source: Ambulatory Visit | Attending: Family | Admitting: Family

## 2018-12-12 ENCOUNTER — Encounter: Payer: Self-pay | Admitting: Family

## 2018-12-12 VITALS — BP 122/82 | HR 92 | Temp 96.7°F | Resp 18 | Ht 67.0 in | Wt 376.0 lb

## 2018-12-12 DIAGNOSIS — Z23 Encounter for immunization: Secondary | ICD-10-CM

## 2018-12-12 DIAGNOSIS — S99912A Unspecified injury of left ankle, initial encounter: Secondary | ICD-10-CM | POA: Diagnosis not present

## 2018-12-12 DIAGNOSIS — S93402A Sprain of unspecified ligament of left ankle, initial encounter: Secondary | ICD-10-CM | POA: Diagnosis not present

## 2018-12-12 MED ORDER — MELOXICAM 7.5 MG PO TABS: 7.5000 mg | ORAL_TABLET | Freq: Every day | ORAL | 0 refills | Status: DC

## 2018-12-12 NOTE — Progress Notes (Signed)
Subjective:    Patient ID: Margaret Golden, female    DOB: 04/28/1990, 28 y.o.   MRN: [id number]  HPI  Patient is a 28 yr old female who presents today to discuss ankle injury which occurred on 8/12. Reports that she turned while turning at work.  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 She states that she was standing on the mat at work and turned.  States her body rotated but her left ankle did not.  She reported it to her supervisor and was initially evaluated by Workmen's Comp.  States that her claim was denied because they told her that it could not be proven to be a work-related injury.  Reports that she has + left ankle soreness today.  Reports sensation of "pins and needles" in her left foot.  Has + pain with weight bearing.  Reports that she had a negative x-ray initially.  Now having some pain on the right leg because she has been favoring the right side. She has continued to work though she was off of her feet 80% of the time during there workman's comp eval. Now she is back on full duty and is struggling with this due to pain/swelling in the L ankle.  Reports that she has tried tramadol without improvement in her pain- "it just made me sleepy."   Wt Readings from Last 3 Encounters:  12/12/18 (!) 376 lb (170.6 kg)  05/02/18 (!) 357 lb (161.9 kg)  03/14/18 (!) 356 lb (161.5 kg)   Patient reports that since she started working she has started eating smaller meals.  Snacking less. She is drinking more water. Down to 2 cans of soda day.      Review of Systems See HPI  Past Medical History:  Diagnosis Date  . ADD (attention deficit disorder)   . Anxiety   . Depression   . Fatty liver   . History of chicken pox   . History of kidney stones   . RLS (restless legs syndrome)   . Thyroid disease    hypothyroid     Social History   Socioeconomic History  . Marital status: Single    Spouse name: Not on file  . Number of children: Not on file  . Years of education: 5012  .  Highest education level: Not on file  Occupational History  . Not on file  Social Needs  . Financial resource strain: Not on file  . Food insecurity    Worry: Not on file    Inability: Not on file  . Transportation needs    Medical: Not on file    Non-medical: Not on file  Tobacco Use  . Smoking status: Never Smoker  . Smokeless tobacco: Never Used  Substance and Sexual Activity  . Alcohol use: Never    Alcohol/week: 0.0 standard drinks    Frequency: Never  . Drug use: No  . Sexual activity: Never  Lifestyle  . Physical activity    Days per week: Not on file    Minutes per session: Not on file  . Stress: Not on file  Relationships  . Social Musicianconnections    Talks on phone: Not on file    Gets together: Not on file    Attends religious service: Not on file    Active member of club or organization: Not on file    Attends meetings of clubs or organizations: Not on file    Relationship status: Not on file  . Intimate partner  violence    Fear of current or ex partner: Not on file    Emotionally abused: Not on file    Physically abused: Not on file    Forced sexual activity: Not on file  Other Topics Concern  . Not on file  Social History Narrative   Unemployed   Lives with Dad   Seeking work   Only child   Dog 3   Lives in a one story house with father, has a cat-12/26/16-sjb    Past Surgical History:  Procedure Laterality Date  . KNEE ARTHROSCOPY Right 2007  . TONSILLECTOMY  2010  . WISDOM TOOTH EXTRACTION      Family History  Problem Relation Age of Onset  . Depression Mother   . Depression Father   . Cancer Paternal Aunt        colon  . Cancer Paternal Uncle        "small cell cancer"  . Diabetes Maternal Grandfather   . Arthritis Maternal Grandfather   . Arthritis Maternal Grandmother   . Breast cancer Maternal Grandmother   . Arthritis Paternal Grandmother   . Arthritis Paternal Grandfather     Allergies  Allergen Reactions  . Augmentin  [Amoxicillin-Pot Clavulanate]     hives    Current Outpatient Medications on File Prior to Visit  Medication Sig Dispense Refill  . aspirin-acetaminophen-caffeine (EXCEDRIN MIGRAINE) 250-250-65 MG tablet Take by mouth every 6 (six) hours as needed for headache.    . dicyclomine (BENTYL) 10 MG capsule Take 1 capsule (10 mg total) by mouth 4 (four) times daily -  before meals and at bedtime. 30 capsule 0  . Ferrous Sulfate (IRON) 28 MG TABS Take 2 tablets by mouth daily.    . Gabapentin Enacarbil (HORIZANT) 600 MG TBCR Take 1 tablet (600 mg total) by mouth every evening. 30 tablet 5  . [START ON 12/23/2018] lamoTRIgine (LAMICTAL) 100 MG tablet Take 1 tablet (100 mg total) by mouth daily. Take 1 tab po qd x 2 weeks, then 1.5 tabs po qd 45 tablet 0  . lamoTRIgine (LAMICTAL) 25 MG tablet Take 1 tablet (25 mg total) by mouth daily for 14 days, THEN 2 tablets (50 mg total) daily for 14 days. 45 tablet 0  . levothyroxine (SYNTHROID) 175 MCG tablet Take 1 tablet (175 mcg total) by mouth daily before breakfast. (Patient taking differently: Take 150 mcg by mouth daily before breakfast. ) 30 tablet 5  . Multiple Vitamins-Minerals (MULTIVITAMIN WITH MINERALS) tablet Take 1 tablet by mouth daily.    Marland Kitchen omeprazole (PRILOSEC) 20 MG capsule Take 2 capsules (40 mg total) by mouth daily. 60 capsule 5  . ondansetron (ZOFRAN) 4 MG tablet Take 1 tablet (4 mg total) by mouth every 8 (eight) hours as needed. 20 tablet 0  . sertraline (ZOLOFT) 100 MG tablet Take 1/2 tablet daily for 1 week, then increase to 1 tablet daily for 1 week, then 1.5 tabs daily for 1 week, then 2 tablets daily 180 tablet 1   No current facility-administered medications on file prior to visit.     BP 122/82 (BP Location: Right Arm, Patient Position: Sitting, Cuff Size: Large)   Pulse 92   Temp (!) 96.7 F (35.9 C) (Temporal)   Resp 18   Ht 5\' 7"  (1.702 m)   Wt (!) 376 lb (170.6 kg)   SpO2 98%   BMI 58.89 kg/m       Objective:    Physical Exam Constitutional:  Appearance: She is well-developed. She is obese.  Neck:     Musculoskeletal: Neck supple.     Thyroid: No thyromegaly.  Cardiovascular:     Rate and Rhythm: Normal rate and regular rhythm.     Heart sounds: Normal heart sounds. No murmur.  Pulmonary:     Effort: Pulmonary effort is normal. No respiratory distress.     Breath sounds: Normal breath sounds. No wheezing.  Musculoskeletal:     Comments: + swelling of left ankle, + tenderness to palpation of lateral malleolus   Skin:    General: Skin is warm and dry.  Neurological:     Mental Status: She is alert and oriented to person, place, and time.  Psychiatric:        Behavior: Behavior normal.        Thought Content: Thought content normal.        Judgment: Judgment normal.           Assessment & Plan:  L ankle injury- will repeat x-ray of the left ankle. Trial of meloxicam. Refer to sports medicine for further evaluation. I have advised her to remain off her feet 50% of the time at work and have provided her with a letter for her employer. Will defer further restructions etc. To sports medicine after she establishes with them.  Morbid obesity- 20 pound weight gain over the winter. Discussed cutting out the soda and snacks.  She is requesting a referral back to nutritionist.    Flu shot today.

## 2018-12-12 NOTE — Patient Instructions (Signed)
Begin meloxicam once daily as needed for ankle pain. You should be scheduled about your referral to sports medicine.

## 2018-12-15 DIAGNOSIS — F4323 Adjustment disorder with mixed anxiety and depressed mood: Secondary | ICD-10-CM | POA: Diagnosis not present

## 2018-12-16 ENCOUNTER — Ambulatory Visit (INDEPENDENT_AMBULATORY_CARE_PROVIDER_SITE_OTHER): Payer: BC Managed Care – PPO | Admitting: Family Medicine

## 2018-12-16 ENCOUNTER — Other Ambulatory Visit: Payer: Self-pay

## 2018-12-16 DIAGNOSIS — M25572 Pain in left ankle and joints of left foot: Secondary | ICD-10-CM

## 2018-12-16 NOTE — Patient Instructions (Signed)
Nice to meet you  Please try the range of motion exercises  Please try to ice during your shift.  Please wear the boot if you are up and around. You can take it off at night Please send me a message in MyChart with any questions or updates.  Please see me back in 2 weeks.   --Dr. Raeford Razor

## 2018-12-16 NOTE — Progress Notes (Signed)
Margaret Famaylor A Melander - 28 y.o. female MRN [id number]  Date of birth: 03/23/1991  SUBJECTIVE:  Including CC & ROS.  No chief complaint on file.   Margaret Golden is a 28 y.o. female that is is presenting with acute on chronic left ankle pain.  She had an injury recently where she had an inversion injury.  She has a history of repeated left ankle injuries.  She has been wearing a compression ankle sleeve.  She is also been taking meloxicam.  She has been provided a work note limiting the amount of time that she is on her feet.  Has performed physical therapy in the past.  The pain is moderate to severe.  Is localized to the anterior lateral ankle.  Pain is worse at the end of the day and with extended walking.  Independent review of the left ankle x-ray from 9/11 shows no acute abnormality.   Review of Systems  Constitutional: Negative for fever.  HENT: Negative for congestion.   Respiratory: Negative for cough.   Cardiovascular: Negative for chest pain.  Gastrointestinal: Negative for abdominal pain.  Musculoskeletal: Positive for gait problem.  Skin: Negative for color change.  Neurological: Negative for weakness.  Hematological: Negative for adenopathy.    HISTORY: Past Medical, Surgical, Social, and Family History Reviewed & Updated per EMR.   Pertinent Historical Findings include:  Past Medical History:  Diagnosis Date  . ADD (attention deficit disorder)   . Anxiety   . Depression   . Fatty liver   . History of chicken pox   . History of kidney stones   . RLS (restless legs syndrome)   . Thyroid disease    hypothyroid    Past Surgical History:  Procedure Laterality Date  . KNEE ARTHROSCOPY Right 2007  . TONSILLECTOMY  2010  . WISDOM TOOTH EXTRACTION      Allergies  Allergen Reactions  . Augmentin [Amoxicillin-Pot Clavulanate]     hives    Family History  Problem Relation Age of Onset  . Depression Mother   . Depression Father   . Cancer Paternal Aunt    colon  . Cancer Paternal Uncle        "small cell cancer"  . Diabetes Maternal Grandfather   . Arthritis Maternal Grandfather   . Arthritis Maternal Grandmother   . Breast cancer Maternal Grandmother   . Arthritis Paternal Grandmother   . Arthritis Paternal Grandfather      Social History   Socioeconomic History  . Marital status: Single    Spouse name: Not on file  . Number of children: Not on file  . Years of education: 8012  . Highest education level: Not on file  Occupational History  . Not on file  Social Needs  . Financial resource strain: Not on file  . Food insecurity    Worry: Not on file    Inability: Not on file  . Transportation needs    Medical: Not on file    Non-medical: Not on file  Tobacco Use  . Smoking status: Never Smoker  . Smokeless tobacco: Never Used  Substance and Sexual Activity  . Alcohol use: Never    Alcohol/week: 0.0 standard drinks    Frequency: Never  . Drug use: No  . Sexual activity: Never  Lifestyle  . Physical activity    Days per week: Not on file    Minutes per session: Not on file  . Stress: Not on file  Relationships  . Social connections  Talks on phone: Not on file    Gets together: Not on file    Attends religious service: Not on file    Active member of club or organization: Not on file    Attends meetings of clubs or organizations: Not on file    Relationship status: Not on file  . Intimate partner violence    Fear of current or ex partner: Not on file    Emotionally abused: Not on file    Physically abused: Not on file    Forced sexual activity: Not on file  Other Topics Concern  . Not on file  Social History Narrative   Unemployed   Lives with Dad   Seeking work   Only child   Dog 3   Lives in a one story house with father, has a cat-12/26/16-sjb     PHYSICAL EXAM:  VS: LMP 12/05/2018  Physical Exam Gen: NAD, alert, cooperative with exam, well-appearing ENT: normal lips, normal nasal mucosa,  Eye:  normal EOM, normal conjunctiva and lids CV:  no edema, +2 pedal pulses   Resp: no accessory muscle use, non-labored,  Skin: no rashes, no areas of induration  Neuro: normal tone, normal sensation to touch Psych:  normal insight, alert and oriented MSK:  Left ankle: No significant swelling or ecchymosis. Normal range of motion. Normal strength resistance. Tenderness palpation of the ATFL. Mild translation with anterior drawer. Neurovascular intact     ASSESSMENT & PLAN:   Left ankle pain Acute on chronic in nature.  Has had several incidents in the past.  Unclear if she has significant ligamentous damage at baseline. -Cam walker. -Counseled on home exercise therapy and supportive care. -Has a work note provided by her primary provider for the next 2 weeks. -Follow-up in 2 weeks.  Would consider MRI to evaluate for possible significant ligamentous change or osteochondral defect.

## 2018-12-17 NOTE — Assessment & Plan Note (Signed)
Acute on chronic in nature.  Has had several incidents in the past.  Unclear if she has significant ligamentous damage at baseline. -Cam walker. -Counseled on home exercise therapy and supportive care. -Has a work note provided by her primary provider for the next 2 weeks. -Follow-up in 2 weeks.  Would consider MRI to evaluate for possible significant ligamentous change or osteochondral defect.

## 2018-12-22 DIAGNOSIS — F4323 Adjustment disorder with mixed anxiety and depressed mood: Secondary | ICD-10-CM | POA: Diagnosis not present

## 2018-12-29 DIAGNOSIS — F4323 Adjustment disorder with mixed anxiety and depressed mood: Secondary | ICD-10-CM | POA: Diagnosis not present

## 2018-12-30 ENCOUNTER — Other Ambulatory Visit: Payer: Self-pay

## 2018-12-30 ENCOUNTER — Ambulatory Visit: Payer: BC Managed Care – PPO | Admitting: Family Medicine

## 2018-12-30 VITALS — BP 126/80 | Ht 67.0 in | Wt 376.0 lb

## 2018-12-30 DIAGNOSIS — M25572 Pain in left ankle and joints of left foot: Secondary | ICD-10-CM

## 2018-12-30 DIAGNOSIS — G8929 Other chronic pain: Secondary | ICD-10-CM | POA: Diagnosis not present

## 2018-12-30 NOTE — Patient Instructions (Signed)
Good to see you You can continue the boot as you need  Please call [phone] to get the MRI  Please send me a message in MyChart with any questions or updates.  We will schedule a virtual visit once the MRi is completed.   --Dr. Raeford Razor

## 2018-12-30 NOTE — Progress Notes (Signed)
Margaret Golden - 29 y.o. female MRN [id number]  Date of birth: 1990-04-05  SUBJECTIVE:  Including CC & ROS.  No chief complaint on file.   Margaret Golden is a 28 y.o. female that is following up for her left ankle pain.  She still is a current saying pain over the ATFL and around the lateral malleolus.  She has been dealing with this problem for several years.  She had use of boot off and on for over 4 years.  She does have some improvement of her pain.  Her ankle does feel unstable if she does not wear the cam walker.  No history of surgery.  Pain seems to be localized around the ankle and has also endorsed some dorsal midfoot pain.  This pain seems to be more new.  It started since wearing the cam walker.  Denies any numbness or tingling.   Review of Systems  Constitutional: Negative for fever.  HENT: Negative for congestion.   Respiratory: Negative for cough.   Cardiovascular: Negative for chest pain.  Gastrointestinal: Negative for abdominal pain.  Musculoskeletal: Positive for gait problem.  Skin: Negative for color change.  Neurological: Negative for tremors.  Hematological: Negative for adenopathy.    HISTORY: Past Medical, Surgical, Social, and Family History Reviewed & Updated per EMR.   Pertinent Historical Findings include:  Past Medical History:  Diagnosis Date  . ADD (attention deficit disorder)   . Anxiety   . Depression   . Fatty liver   . History of chicken pox   . History of kidney stones   . RLS (restless legs syndrome)   . Thyroid disease    hypothyroid    Past Surgical History:  Procedure Laterality Date  . KNEE ARTHROSCOPY Right 2007  . TONSILLECTOMY  2010  . WISDOM TOOTH EXTRACTION      Allergies  Allergen Reactions  . Augmentin [Amoxicillin-Pot Clavulanate]     hives    Family History  Problem Relation Age of Onset  . Depression Mother   . Depression Father   . Cancer Paternal Aunt        colon  . Cancer Paternal Uncle        "small  cell cancer"  . Diabetes Maternal Grandfather   . Arthritis Maternal Grandfather   . Arthritis Maternal Grandmother   . Breast cancer Maternal Grandmother   . Arthritis Paternal Grandmother   . Arthritis Paternal Grandfather      Social History   Socioeconomic History  . Marital status: Single    Spouse name: Not on file  . Number of children: Not on file  . Years of education: 53  . Highest education level: Not on file  Occupational History  . Not on file  Social Needs  . Financial resource strain: Not on file  . Food insecurity    Worry: Not on file    Inability: Not on file  . Transportation needs    Medical: Not on file    Non-medical: Not on file  Tobacco Use  . Smoking status: Never Smoker  . Smokeless tobacco: Never Used  Substance and Sexual Activity  . Alcohol use: Never    Alcohol/week: 0.0 standard drinks    Frequency: Never  . Drug use: No  . Sexual activity: Never  Lifestyle  . Physical activity    Days per week: Not on file    Minutes per session: Not on file  . Stress: Not on file  Relationships  .  Social Musician on phone: Not on file    Gets together: Not on file    Attends religious service: Not on file    Active member of club or organization: Not on file    Attends meetings of clubs or organizations: Not on file    Relationship status: Not on file  . Intimate partner violence    Fear of current or ex partner: Not on file    Emotionally abused: Not on file    Physically abused: Not on file    Forced sexual activity: Not on file  Other Topics Concern  . Not on file  Social History Narrative   Unemployed   Lives with Dad   Seeking work   Only child   Dog 3   Lives in a one story house with father, has a cat-12/26/16-sjb     PHYSICAL EXAM:  VS: BP 126/80   Ht 5\' 7"  (1.702 m)   Wt (!) 376 lb (170.6 kg)   LMP 12/05/2018   BMI 58.89 kg/m  Physical Exam Gen: NAD, alert, cooperative with exam, well-appearing ENT: normal  lips, normal nasal mucosa,  Eye: normal EOM, normal conjunctiva and lids CV:  no edema, +2 pedal pulses   Resp: no accessory muscle use, non-labored,  Skin: no rashes, no areas of induration  Neuro: normal tone, normal sensation to touch Psych:  normal insight, alert and oriented MSK:  Left ankle/foot: Tender to palpation of the ATFL. Tenderness palpation over the lateral malleolus  Normal ankle ROM  Anterior translation with anterior drawer  Normal strength to resistance NVI      ASSESSMENT & PLAN:   Left ankle pain Acute on chronic in nature.  Has sustained several injuries in the past.  Does get some improvement with the cam walker but continues to have pain without it. -Can continue cam walker. -Counseled on home exercise therapy and supportive care. -MRI to evaluate ligament integrity.

## 2018-12-30 NOTE — Assessment & Plan Note (Signed)
Acute on chronic in nature.  Has sustained several injuries in the past.  Does get some improvement with the cam walker but continues to have pain without it. -Can continue cam walker. -Counseled on home exercise therapy and supportive care. -MRI to evaluate ligament integrity.

## 2019-01-01 ENCOUNTER — Other Ambulatory Visit: Payer: Self-pay | Admitting: Family

## 2019-01-01 ENCOUNTER — Other Ambulatory Visit: Payer: Self-pay | Admitting: Psychiatry

## 2019-01-01 DIAGNOSIS — F330 Major depressive disorder, recurrent, mild: Secondary | ICD-10-CM

## 2019-01-01 NOTE — Telephone Encounter (Signed)
Call patient to check dose

## 2019-01-05 DIAGNOSIS — F4323 Adjustment disorder with mixed anxiety and depressed mood: Secondary | ICD-10-CM | POA: Diagnosis not present

## 2019-01-05 NOTE — Telephone Encounter (Signed)
Left voicemail to verify dose she's taking

## 2019-01-06 NOTE — Telephone Encounter (Signed)
Haven't been able to reach to confirm dose

## 2019-01-17 ENCOUNTER — Other Ambulatory Visit: Payer: Self-pay

## 2019-01-17 ENCOUNTER — Ambulatory Visit
Admission: RE | Admit: 2019-01-17 | Discharge: 2019-01-17 | Disposition: A | Discharge status: Home / Self Care | Payer: BC Managed Care – PPO | Source: Ambulatory Visit | Attending: Family Medicine | Admitting: Family Medicine

## 2019-01-17 DIAGNOSIS — M25572 Pain in left ankle and joints of left foot: Secondary | ICD-10-CM

## 2019-01-17 DIAGNOSIS — G8929 Other chronic pain: Secondary | ICD-10-CM

## 2019-01-19 ENCOUNTER — Other Ambulatory Visit: Payer: Self-pay

## 2019-01-19 ENCOUNTER — Ambulatory Visit (INDEPENDENT_AMBULATORY_CARE_PROVIDER_SITE_OTHER): Payer: BC Managed Care – PPO | Admitting: Family Medicine

## 2019-01-19 DIAGNOSIS — G8929 Other chronic pain: Secondary | ICD-10-CM

## 2019-01-19 DIAGNOSIS — M25572 Pain in left ankle and joints of left foot: Secondary | ICD-10-CM

## 2019-01-19 DIAGNOSIS — F4323 Adjustment disorder with mixed anxiety and depressed mood: Secondary | ICD-10-CM | POA: Diagnosis not present

## 2019-01-19 NOTE — Progress Notes (Signed)
Virtual Visit via Video Note  I connected with Margaret Golden on 01/19/19 at  1:50 PM EDT by a video enabled telemedicine application and verified that I am speaking with the correct person using two identifiers.   I discussed the limitations of evaluation and management by telemedicine and the availability of in person appointments. The patient expressed understanding and agreed to proceed.  History of Present Illness:  [name] is a 28 year old female that is following up for her left ankle pain after an MRI.  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  She has had repeated inversion injuries throughout her life of the left ankle.  She has to stand for several hours in a row at work.  Has been using a cam walker intermittently for several years.   Observations/Objective:  Gen: NAD, alert, well-appearing ENT: normal lips, normal nasal mucosa,  Eye: normal EOM, normal conjunctiva and lids Resp: no accessory muscle use, non-labored,  Psych:  normal insight, alert and oriented   Assessment and Plan:  Left ankle pain:  MRI was demonstrating chondral loss.  This may be relating to her chronic pain.  No prior injections.  Does use a cam walker intermittently. -Counseled on different measures.  Could consider PRP or joint injection. -Refer to Ortho at this time to see if any other options will be more viable -Counseled on supportive care. -Provided work note.  Follow Up Instructions:    I discussed the assessment and treatment plan with the patient. The patient was provided an opportunity to ask questions and all were answered. The patient agreed with the plan and demonstrated an understanding of the instructions.   The patient was advised to call back or seek an in-person evaluation if the symptoms worsen or if the condition fails to improve as anticipated.   Clearance Coots, MD

## 2019-01-19 NOTE — Assessment & Plan Note (Signed)
MRI was demonstrating chondral loss.  This may be relating to her chronic pain.  No prior injections.  Does use a cam walker intermittently. -Counseled on different measures.  Could consider PRP or joint injection. -Refer to Ortho at this time to see if any other options will be more viable -Counseled on supportive care. -Provided work note.

## 2019-01-26 DIAGNOSIS — F4323 Adjustment disorder with mixed anxiety and depressed mood: Secondary | ICD-10-CM | POA: Diagnosis not present

## 2019-02-03 ENCOUNTER — Other Ambulatory Visit: Payer: Self-pay

## 2019-02-03 ENCOUNTER — Encounter: Payer: Self-pay | Admitting: Psychiatry

## 2019-02-03 ENCOUNTER — Ambulatory Visit (INDEPENDENT_AMBULATORY_CARE_PROVIDER_SITE_OTHER): Payer: BC Managed Care – PPO | Admitting: Psychiatry

## 2019-02-03 DIAGNOSIS — G4700 Insomnia, unspecified: Secondary | ICD-10-CM

## 2019-02-03 DIAGNOSIS — F330 Major depressive disorder, recurrent, mild: Secondary | ICD-10-CM | POA: Diagnosis not present

## 2019-02-03 DIAGNOSIS — F4010 Social phobia, unspecified: Secondary | ICD-10-CM | POA: Diagnosis not present

## 2019-02-03 DIAGNOSIS — G2581 Restless legs syndrome: Secondary | ICD-10-CM | POA: Diagnosis not present

## 2019-02-03 DIAGNOSIS — F4323 Adjustment disorder with mixed anxiety and depressed mood: Secondary | ICD-10-CM | POA: Diagnosis not present

## 2019-02-03 MED ORDER — HORIZANT 600 MG PO TBCR: 600.0000 mg | EXTENDED_RELEASE_TABLET | Freq: Every day | ORAL | 2 refills | Status: AC

## 2019-02-03 MED ORDER — LAMOTRIGINE 200 MG PO TABS: 200.0000 mg | ORAL_TABLET | Freq: Every day | ORAL | 0 refills | Status: DC

## 2019-02-03 MED ORDER — SERTRALINE HCL 100 MG PO TABS: 200.0000 mg | ORAL_TABLET | Freq: Every day | ORAL | 0 refills | Status: DC

## 2019-02-03 NOTE — Progress Notes (Signed)
Margaret Golden [id number] 01/11/1991 27 y.o.  Subjective:   Patient ID:  Margaret Golden is a 28 y.o. (DOB 09/09/1990) female.  Chief Complaint:  Chief Complaint  Patient presents with  . Depression  . Anxiety    HPI Margaret Golden presents to the office today for follow-up of depression, anxiety, and insomnia. She reports that she may need to have surgery on her ankle and has been unable to return to work since accommodations were not accepted. She has apt on 02/09/19 re: tx plan.   She reports that she is having some depression in response to stressors. Reports depressed mood has not been persistent but is happening more frequently. She has noticed some recent irritability.  Has been having anxiety about her health issue, medical expenses, job, and lack of income. Reports some catastrophic thinking. Has had some panic s/s. Reports that she has not had any recent full blown panic attacks. She reports that she has been sleeping more and appetite has been fluctuating. Increased difficulty falling asleep, then sleeping up to 10 hours, and having difficulty getting up. Energy and motivation have been low. She reports increased difficulty with concentration. Denies anhedonia. Continues to enjoy spending time with friends online. Denies SI.   Continues to see therapist regularly.   Father is looking for a job and may relocate for a job.   Trying to limit soda to 1-2 cans daily.   Past medication trials: Sertraline-helpful for depression and anxiety Wellbutrin Lexapro-ineffective Cymbalta Pristiq Seroquel- does not recall any significant improvement Latuda Abilify Propanolol Melatonin Vyvanse Adderall XR- not effective Concerta- Notices duration only lasts about 4 hours. No significant improvement with doses less than 54 mg. Horizant Lamictal-effective for mood Trazodone-effective for insomnia but causes some excessive drowsiness Rexulti- May have caused n/v and poor  concentration Adhansia- May have caused n/v and poor concentration  Review of Systems:  Review of Systems  Musculoskeletal: Negative for gait problem.       Increased ankle pain.   Skin: Negative for rash.  Neurological: Positive for headaches. Negative for tremors.       Notices increased RLS and attributes this to stress  Psychiatric/Behavioral:       Please refer to HPI    Medications: I have reviewed the patient's current medications.  Current Outpatient Medications  Medication Sig Dispense Refill  . aspirin-acetaminophen-caffeine (EXCEDRIN MIGRAINE) 250-250-65 MG tablet Take by mouth every 6 (six) hours as needed for headache.    . Ferrous Sulfate (IRON) 28 MG TABS Take 2 tablets by mouth daily.    . Gabapentin Enacarbil (HORIZANT) 600 MG TBCR Take 1 tablet (600 mg total) by mouth daily after supper. 30 tablet 2  . lamoTRIgine (LAMICTAL) 200 MG tablet Take 1 tablet (200 mg total) by mouth daily. 90 tablet 0  . levothyroxine (SYNTHROID) 175 MCG tablet Take 1 tablet (175 mcg total) by mouth daily before breakfast. (Patient taking differently: Take 150 mcg by mouth daily before breakfast. ) 30 tablet 5  . Multiple Vitamins-Minerals (MULTIVITAMIN WITH MINERALS) tablet Take 1 tablet by mouth daily.    Marland Kitchen. omeprazole (PRILOSEC) 20 MG capsule Take 2 capsules (40 mg total) by mouth daily. 60 capsule 5  . sertraline (ZOLOFT) 100 MG tablet Take 2 tablets (200 mg total) by mouth daily. 180 tablet 0  . dicyclomine (BENTYL) 10 MG capsule Take 1 capsule (10 mg total) by mouth 4 (four) times daily -  before meals and at bedtime. (Patient not taking: Reported on 02/03/2019)  30 capsule 0  . meloxicam (MOBIC) 7.5 MG tablet TAKE ONE TABLET BY MOUTH DAILY (Patient not taking: Reported on 02/03/2019) 14 tablet 0  . ondansetron (ZOFRAN) 4 MG tablet Take 1 tablet (4 mg total) by mouth every 8 (eight) hours as needed. 20 tablet 0   No current facility-administered medications for this visit.     Medication  Side Effects: Other: Had some HA with re-start of meds. HA's are now infrequent  Allergies:  Allergies  Allergen Reactions  . Augmentin [Amoxicillin-Pot Clavulanate]     hives    Past Medical History:  Diagnosis Date  . ADD (attention deficit disorder)   . Anxiety   . Depression   . Fatty liver   . History of chicken pox   . History of kidney stones   . RLS (restless legs syndrome)   . Thyroid disease    hypothyroid    Family History  Problem Relation Age of Onset  . Depression Mother   . Depression Father   . Cancer Paternal Aunt        colon  . Cancer Paternal Uncle        "small cell cancer"  . Diabetes Maternal Grandfather   . Arthritis Maternal Grandfather   . Arthritis Maternal Grandmother   . Breast cancer Maternal Grandmother   . Arthritis Paternal Grandmother   . Arthritis Paternal Grandfather     Social History   Socioeconomic History  . Marital status: Single    Spouse name: Not on file  . Number of children: Not on file  . Years of education: 35  . Highest education level: Not on file  Occupational History  . Not on file  Social Needs  . Financial resource strain: Not on file  . Food insecurity    Worry: Not on file    Inability: Not on file  . Transportation needs    Medical: Not on file    Non-medical: Not on file  Tobacco Use  . Smoking status: Never Smoker  . Smokeless tobacco: Never Used  Substance and Sexual Activity  . Alcohol use: Never    Alcohol/week: 0.0 standard drinks    Frequency: Never  . Drug use: No  . Sexual activity: Never  Lifestyle  . Physical activity    Days per week: Not on file    Minutes per session: Not on file  . Stress: Not on file  Relationships  . Social Musician on phone: Not on file    Gets together: Not on file    Attends religious service: Not on file    Active member of club or organization: Not on file    Attends meetings of clubs or organizations: Not on file    Relationship  status: Not on file  . Intimate partner violence    Fear of current or ex partner: Not on file    Emotionally abused: Not on file    Physically abused: Not on file    Forced sexual activity: Not on file  Other Topics Concern  . Not on file  Social History Narrative   Unemployed   Lives with Dad   Seeking work   Only child   Dog 3   Lives in a one story house with father, has a cat-12/26/16-sjb    Past Medical History, Surgical history, Social history, and Family history were reviewed and updated as appropriate.   Please see review of systems for further details on the patient's  review from today.   Objective:   Physical Exam:  There were no vitals taken for this visit.  Physical Exam Constitutional:      General: She is not in acute distress.    Appearance: She is well-developed.  Musculoskeletal:        General: No deformity.  Neurological:     Mental Status: She is alert and oriented to person, place, and time.     Coordination: Coordination normal.  Psychiatric:        Attention and Perception: Attention and perception normal. She does not perceive auditory or visual hallucinations.        Mood and Affect: Mood is anxious. Affect is not labile, blunt, angry or inappropriate.        Speech: Speech normal.        Behavior: Behavior normal.        Thought Content: Thought content normal. Thought content is not paranoid or delusional. Thought content does not include homicidal or suicidal ideation. Thought content does not include homicidal or suicidal plan.        Cognition and Memory: Cognition and memory normal.        Judgment: Judgment normal.     Comments: Mood presents as mildly depressed Insight intact.      Lab Review:     Component Value Date/Time   NA 138 02/19/2018 1214   K 3.6 02/19/2018 1214   CL 104 02/19/2018 1214   CO2 25 02/19/2018 1214   GLUCOSE 111 (H) 02/19/2018 1214   BUN 10 02/19/2018 1214   CREATININE 0.71 02/19/2018 1214   CREATININE  0.75 07/06/2016 1534   CALCIUM 8.4 02/19/2018 1214   PROT 8.1 07/26/2017 1014   ALBUMIN 4.5 07/26/2017 1014   AST 33 07/26/2017 1014   ALT 21 07/26/2017 1014   ALKPHOS 82 07/26/2017 1014   BILITOT 0.5 07/26/2017 1014       Component Value Date/Time   WBC 9.8 05/02/2018 1533   RBC 5.38 (H) 05/02/2018 1533   HGB 12.1 05/02/2018 1533   HCT 39.5 05/02/2018 1533   PLT 257 05/02/2018 1533   MCV 73.4 (L) 05/02/2018 1533   MCH 22.5 (L) 05/02/2018 1533   MCHC 30.6 (L) 05/02/2018 1533   RDW 14.0 05/02/2018 1533   LYMPHSABS 2,656 05/02/2018 1533   MONOABS 0.4 08/06/2017 1359   EOSABS 167 05/02/2018 1533   BASOSABS 59 05/02/2018 1533    No results found for: POCLITH, LITHIUM   No results found for: PHENYTOIN, PHENOBARB, VALPROATE, CBMZ   .res Assessment: Plan:   Pt seen for 30 minutes and greater than 50% of visit spent counseling pt re: tx options to include potential benefits, risks, and side effects of increase in Lamictal to 200 mg po qd since this dose has been most effective for her mood and anxiety in the past.  Will continue Horizant 600 mg in the evening for RLS. Discussed that this may also be helpful for her pain.  Will continue Sertraline 200 mg po qd for mood and anxiety. Recommend continuing therapy with Tina Griffiths, LPC.  Pt to f/u in 4 weeks or sooner if clinically indicated. Discussed that pt could cancel apt and extend f/u to 2-3 months if she experiences a significant improvement in mood and anxiety in the next few weeks. Patient advised to contact office with any questions, adverse effects, or acute worsening in signs and symptoms.  Meagan was seen today for depression and anxiety.  Diagnoses and all orders for  this visit:  Major depressive disorder, recurrent episode, mild (HCC) -     lamoTRIgine (LAMICTAL) 200 MG tablet; Take 1 tablet (200 mg total) by mouth daily. -     sertraline (ZOLOFT) 100 MG tablet; Take 2 tablets (200 mg total) by mouth daily.  RLS  (restless legs syndrome) -     Gabapentin Enacarbil (HORIZANT) 600 MG TBCR; Take 1 tablet (600 mg total) by mouth daily after supper.  Social phobia -     sertraline (ZOLOFT) 100 MG tablet; Take 2 tablets (200 mg total) by mouth daily.  Insomnia, unspecified type     Please see After Visit Summary for patient specific instructions.  Future Appointments  Date Time Provider [place]  02/10/2019 10:30 AM Christella Hartigan, RD [place] [facility]  03/09/2019 11:00 AM Thayer Headings, PMHNP [facility] None    No orders of the defined types were placed in this encounter.   -------------------------------

## 2019-02-04 ENCOUNTER — Telehealth: Payer: Self-pay

## 2019-02-04 NOTE — Telephone Encounter (Signed)
Prior authorization submitted and DENIED for Horizant 600 mg through [facility]. Jessica notified and left patient voicemail and to call back to discuss options.

## 2019-02-09 DIAGNOSIS — S93402A Sprain of unspecified ligament of left ankle, initial encounter: Secondary | ICD-10-CM | POA: Diagnosis not present

## 2019-02-10 ENCOUNTER — Encounter: Payer: BC Managed Care – PPO | Attending: Family | Admitting: Registered"

## 2019-02-10 ENCOUNTER — Other Ambulatory Visit: Payer: Self-pay

## 2019-02-10 ENCOUNTER — Encounter: Payer: Self-pay | Admitting: Registered"

## 2019-02-10 DIAGNOSIS — Z713 Dietary counseling and surveillance: Secondary | ICD-10-CM | POA: Insufficient documentation

## 2019-02-10 DIAGNOSIS — F4323 Adjustment disorder with mixed anxiety and depressed mood: Secondary | ICD-10-CM | POA: Diagnosis not present

## 2019-02-10 NOTE — Progress Notes (Signed)
Medical Nutrition Therapy:  Appt start time: 2355 end time:  1145.   Assessment:  Primary concerns today: Pt states she wants to take care of her health and eat better. Wants to break soda/caffiene addiction.  Chronic migraines, 2-3/month. Pt reports her migraines have been improving and thinks more consistent sleep schedule and has improved diet some.  Pt states she prefers raw vegetables to cooked vegetables. Pt states she likes flavor or carbonation in water, doesn't drink much plain water.  Sleep: 6-7 hrs when working had regular schedule; insomnia worse when not on schedule. Pt reports a lot of fatigued.   Pt states she has a hard time staying on a schedule without working. Pt states accountability is helpful for her to make changes.  Work: Ankle injury in August now is on disability medical leave. Pt states when she was working she was standing in one place for 10 hrs ([place]), legs and feet hurt.  Pt states before her mother died in 07/29/07 she was sick for a few years and she provided care for her mother. After her mother died she followed her dad's eating pattern which he has a chlorophyll allergy and doesn't eat any green vegetables.    Preferred Learning Style:   No preference indicated   Learning Readiness:   Change in progress   MEDICATIONS: reviewed Omeprazole: Acid reflux since a teenager, has tried cutting back meds but as soon as miss doses GERD comes back. Gabapentin for restless legs    DIETARY INTAKE:  24-hr recall:  B ( AM): none OR toast and sausage (quick and easy)  Snk ( AM): none Or at home small bag chips L ( PM): chicken & cheese sandwich, chips, apple Snk ( PM): none D ( PM): protein, mashed potatoes or rice Snk ( PM): none OR toast Beverages: 1-2 cans Mt Dew soda, Gatorade, trying to drink more water, milk occassionally  Usual physical activity: ADLs  Estimated energy needs: 2000 calories  Progress Towards Goal(s):  New goals.   Nutritional  Diagnosis:  NI-5.8.2 Excessive carbohydrate intake As related to daily soda.  As evidenced by dietary recall.    Intervention:  Nutrition Education. Discussed reasons for eating and foods chosen.   Teaching Method Utilized:  Visual Auditory  Handouts given during visit include:  MyPlate Planner  Sleep Hygiene  Snack sheet  Barriers to learning/adherence to lifestyle change: none  Demonstrated degree of understanding via:  Teach Back   Monitoring/Evaluation:  Dietary intake, exercise, eating schedule, and body weight in 4 week(s).

## 2019-02-10 NOTE — Patient Instructions (Signed)
Consider grocery shopping on set days Tuedays & Saturdays  Timed meals Breakfast: bacon, eggs, toast, apple Snack: fruit Lunch: sandiwich, salad, carrots and add bell peppers Snack: celery and PB Dinner: meat/starch/frozen vegetable Snack: consider snack sheet  Consider having your dad put soda up on the top shelf. Pay attention to the triggers and see if there are other ways to meet your needs.  Take time to figure out ways to get accountability for the changes you want to make.

## 2019-02-13 ENCOUNTER — Encounter: Payer: Self-pay | Admitting: Family Medicine

## 2019-02-17 DIAGNOSIS — F4323 Adjustment disorder with mixed anxiety and depressed mood: Secondary | ICD-10-CM | POA: Diagnosis not present

## 2019-03-02 DIAGNOSIS — S93402D Sprain of unspecified ligament of left ankle, subsequent encounter: Secondary | ICD-10-CM | POA: Diagnosis not present

## 2019-03-04 DIAGNOSIS — F4323 Adjustment disorder with mixed anxiety and depressed mood: Secondary | ICD-10-CM | POA: Diagnosis not present

## 2019-03-05 DIAGNOSIS — S93402D Sprain of unspecified ligament of left ankle, subsequent encounter: Secondary | ICD-10-CM | POA: Diagnosis not present

## 2019-03-09 ENCOUNTER — Encounter: Payer: Self-pay | Admitting: Psychiatry

## 2019-03-09 ENCOUNTER — Ambulatory Visit (INDEPENDENT_AMBULATORY_CARE_PROVIDER_SITE_OTHER): Payer: BC Managed Care – PPO | Admitting: Psychiatry

## 2019-03-09 ENCOUNTER — Other Ambulatory Visit: Payer: Self-pay

## 2019-03-09 DIAGNOSIS — F330 Major depressive disorder, recurrent, mild: Secondary | ICD-10-CM

## 2019-03-09 DIAGNOSIS — G2581 Restless legs syndrome: Secondary | ICD-10-CM | POA: Diagnosis not present

## 2019-03-09 DIAGNOSIS — G4700 Insomnia, unspecified: Secondary | ICD-10-CM | POA: Diagnosis not present

## 2019-03-09 DIAGNOSIS — F4010 Social phobia, unspecified: Secondary | ICD-10-CM

## 2019-03-09 DIAGNOSIS — S93402D Sprain of unspecified ligament of left ankle, subsequent encounter: Secondary | ICD-10-CM | POA: Diagnosis not present

## 2019-03-09 MED ORDER — GABAPENTIN 600 MG PO TABS: 600.0000 mg | ORAL_TABLET | Freq: Every day | ORAL | 0 refills | Status: DC

## 2019-03-09 MED ORDER — SERTRALINE HCL 100 MG PO TABS: 200.0000 mg | ORAL_TABLET | Freq: Every day | ORAL | 0 refills | Status: DC

## 2019-03-09 MED ORDER — LAMOTRIGINE 200 MG PO TABS: 200.0000 mg | ORAL_TABLET | Freq: Every day | ORAL | 0 refills | Status: DC

## 2019-03-09 NOTE — Progress Notes (Signed)
Margaret Famaylor A Belitz [id number] 05/16/1990 28 y.o.  Subjective:   Patient ID:  Margaret Golden is a 28 y.o. (DOB 08/21/1990) female.  Chief Complaint:  Chief Complaint  Patient presents with  . Follow-up    Anxiety, Depression    HPI Margaret Famaylor A Odea presents to the office today for follow-up of anxiety, depression, and insomnia. "Doing well, all things considered."She reports that her mood has been good other than some mild seasonal depression. Reports that her mood tends to be slightly lower on cloudy and/or rainy days. Reports that the holidays are also difficult since the loss of her mother. She reports that her depression is manageable- "more a down mood" and lower motivation and typically will reach out to friends. Reports seasonal depression is less compared to previous years. Reports irritability is less compared to the past. She reports that anxiety has been better overall. Reports anxiety is mostly situational and work related. Energy and motivation are slightly low and not significantly interfering with things. Sleep has been up and down with inconsistent sleep schedule and typically 6-7 hours a night. Appetite has been good. Reports having days where he concentration is not as good and jumps from one thing to the next. Denies SI.   She is starting PT. Remains out of work until learning if she can return to work with accommodations.   She is making plans and looking forward to some things once the pandemic resolves.   Father plans on moving out early next year. She has some anxiety about this. She reports that one of her friends may rent a room from them in the future.   Past medication trials: Sertraline-helpful for depression and anxiety Wellbutrin Lexapro-ineffective Cymbalta Pristiq Seroquel- does not recall any significant improvement Latuda Abilify Propanolol Melatonin Vyvanse Adderall XR- not effective Concerta- Notices duration only lasts about 4 hours. No  significant improvement with doses less than 54 mg. Horizant Lamictal-effective for mood Trazodone-effective for insomnia but causes some excessive drowsiness Rexulti- May have caused n/v and poor concentration Adhansia- May have caused n/v and poor concentration   Review of Systems:  Review of Systems  HENT: Positive for congestion.   Musculoskeletal: Negative for gait problem.  Allergic/Immunologic: Positive for environmental allergies.  Neurological: Positive for headaches. Negative for tremors.       Has some restless leg.   Psychiatric/Behavioral:       Please refer to HPI    Medications: I have reviewed the patient's current medications.  Current Outpatient Medications  Medication Sig Dispense Refill  . aspirin-acetaminophen-caffeine (EXCEDRIN MIGRAINE) 250-250-65 MG tablet Take by mouth every 6 (six) hours as needed for headache.    . Ferrous Sulfate (IRON) 28 MG TABS Take 2 tablets by mouth daily.    Marland Kitchen. lamoTRIgine (LAMICTAL) 200 MG tablet Take 1 tablet (200 mg total) by mouth daily. 90 tablet 0  . levothyroxine (SYNTHROID) 175 MCG tablet Take 1 tablet (175 mcg total) by mouth daily before breakfast. 30 tablet 5  . Multiple Vitamins-Minerals (MULTIVITAMIN WITH MINERALS) tablet Take 1 tablet by mouth daily.    Marland Kitchen. omeprazole (PRILOSEC) 20 MG capsule Take 2 capsules (40 mg total) by mouth daily. 60 capsule 5  . ondansetron (ZOFRAN) 4 MG tablet Take 1 tablet (4 mg total) by mouth every 8 (eight) hours as needed. 20 tablet 0  . sertraline (ZOLOFT) 100 MG tablet Take 2 tablets (200 mg total) by mouth daily. 180 tablet 0  . dicyclomine (BENTYL) 10 MG capsule Take 1 capsule (10  mg total) by mouth 4 (four) times daily -  before meals and at bedtime. (Patient not taking: Reported on 02/03/2019) 30 capsule 0  . gabapentin (NEURONTIN) 600 MG tablet Take 1 tablet (600 mg total) by mouth at bedtime. 90 tablet 0  . meloxicam (MOBIC) 7.5 MG tablet TAKE ONE TABLET BY MOUTH DAILY (Patient not  taking: Reported on 02/03/2019) 14 tablet 0   No current facility-administered medications for this visit.     Medication Side Effects: Headache  Allergies:  Allergies  Allergen Reactions  . Augmentin [Amoxicillin-Pot Clavulanate]     hives    Past Medical History:  Diagnosis Date  . ADD (attention deficit disorder)   . Anxiety   . Depression   . Fatty liver   . History of chicken pox   . History of kidney stones   . RLS (restless legs syndrome)   . Thyroid disease    hypothyroid    Family History  Problem Relation Age of Onset  . Depression Mother   . Depression Father   . Cancer Paternal Aunt        colon  . Cancer Paternal Uncle        "small cell cancer"  . Diabetes Maternal Grandfather   . Arthritis Maternal Grandfather   . Arthritis Maternal Grandmother   . Breast cancer Maternal Grandmother   . Arthritis Paternal Grandmother   . Arthritis Paternal Grandfather     Social History   Socioeconomic History  . Marital status: Single    Spouse name: Not on file  . Number of children: Not on file  . Years of education: 70  . Highest education level: Not on file  Occupational History  . Not on file  Social Needs  . Financial resource strain: Not on file  . Food insecurity    Worry: Not on file    Inability: Not on file  . Transportation needs    Medical: Not on file    Non-medical: Not on file  Tobacco Use  . Smoking status: Never Smoker  . Smokeless tobacco: Never Used  Substance and Sexual Activity  . Alcohol use: Never    Alcohol/week: 0.0 standard drinks    Frequency: Never  . Drug use: No  . Sexual activity: Never  Lifestyle  . Physical activity    Days per week: Not on file    Minutes per session: Not on file  . Stress: Not on file  Relationships  . Social Musician on phone: Not on file    Gets together: Not on file    Attends religious service: Not on file    Active member of club or organization: Not on file    Attends  meetings of clubs or organizations: Not on file    Relationship status: Not on file  . Intimate partner violence    Fear of current or ex partner: Not on file    Emotionally abused: Not on file    Physically abused: Not on file    Forced sexual activity: Not on file  Other Topics Concern  . Not on file  Social History Narrative   Unemployed   Lives with Dad   Seeking work   Only child   Dog 3   Lives in a one story house with father, has a cat-12/26/16-sjb    Past Medical History, Surgical history, Social history, and Family history were reviewed and updated as appropriate.   Please see review of systems  for further details on the patient's review from today.   Objective:   Physical Exam:  There were no vitals taken for this visit.  Physical Exam Constitutional:      General: She is not in acute distress.    Appearance: She is well-developed.  Musculoskeletal:        General: No deformity.  Neurological:     Mental Status: She is alert and oriented to person, place, and time.     Coordination: Coordination normal.  Psychiatric:        Attention and Perception: Attention and perception normal. She does not perceive auditory or visual hallucinations.        Mood and Affect: Mood normal. Mood is not anxious or depressed. Affect is not labile, blunt, angry or inappropriate.        Speech: Speech normal.        Behavior: Behavior normal.        Thought Content: Thought content normal. Thought content does not include homicidal or suicidal ideation. Thought content does not include homicidal or suicidal plan.        Cognition and Memory: Cognition and memory normal.        Judgment: Judgment normal.     Comments: Insight intact. No delusions.      Lab Review:     Component Value Date/Time   NA 138 02/19/2018 1214   K 3.6 02/19/2018 1214   CL 104 02/19/2018 1214   CO2 25 02/19/2018 1214   GLUCOSE 111 (H) 02/19/2018 1214   BUN 10 02/19/2018 1214   CREATININE 0.71  02/19/2018 1214   CREATININE 0.75 07/06/2016 1534   CALCIUM 8.4 02/19/2018 1214   PROT 8.1 07/26/2017 1014   ALBUMIN 4.5 07/26/2017 1014   AST 33 07/26/2017 1014   ALT 21 07/26/2017 1014   ALKPHOS 82 07/26/2017 1014   BILITOT 0.5 07/26/2017 1014       Component Value Date/Time   WBC 9.8 05/02/2018 1533   RBC 5.38 (H) 05/02/2018 1533   HGB 12.1 05/02/2018 1533   HCT 39.5 05/02/2018 1533   PLT 257 05/02/2018 1533   MCV 73.4 (L) 05/02/2018 1533   MCH 22.5 (L) 05/02/2018 1533   MCHC 30.6 (L) 05/02/2018 1533   RDW 14.0 05/02/2018 1533   LYMPHSABS 2,656 05/02/2018 1533   MONOABS 0.4 08/06/2017 1359   EOSABS 167 05/02/2018 1533   BASOSABS 59 05/02/2018 1533    No results found for: POCLITH, LITHIUM   No results found for: PHENYTOIN, PHENOBARB, VALPROATE, CBMZ   .res Assessment: Plan:   Patient seen for 30 minutes and greater than 50% of visit spent counseling patient and coordination of care to include discussing that insurance denied prescription for Horizant for RLS, and therefore recommend changing Horizant to immediate release gabapentin for RLS.  Discussed potential benefits, risks, and side effects of gabapentin.  Recommended taking gabapentin approximately 2 hours before bedtime to allow medication to take effect prior to going to sleep.  Also discussed strategies to improve seasonal depression, to include supplementation with Vit D, addition of light therapy, and attempting to get outdoors when possible when there is increased sunlight.  Provided patient with article regarding the use of light therapy and dark therapy for treatment of seasonal depression. We will continue sertraline for anxiety and depression. Continue Lamictal 200 mg daily for depression. Recommend continuing to see Jacqualine Mau, [facility] for therapy. Patient to follow-up with this provider in 2 months or sooner if clinically indicated. Patient advised  to contact office with any questions, adverse effects, or acute  worsening in signs and symptoms.  Analleli was seen today for follow-up.  Diagnoses and all orders for this visit:  RLS (restless legs syndrome) -     gabapentin (NEURONTIN) 600 MG tablet; Take 1 tablet (600 mg total) by mouth at bedtime.  Major depressive disorder, recurrent episode, mild (HCC) -     sertraline (ZOLOFT) 100 MG tablet; Take 2 tablets (200 mg total) by mouth daily. -     lamoTRIgine (LAMICTAL) 200 MG tablet; Take 1 tablet (200 mg total) by mouth daily.  Social phobia -     sertraline (ZOLOFT) 100 MG tablet; Take 2 tablets (200 mg total) by mouth daily.  Insomnia, unspecified type -     gabapentin (NEURONTIN) 600 MG tablet; Take 1 tablet (600 mg total) by mouth at bedtime.     Please see After Visit Summary for patient specific instructions.  Future Appointments  Date Time Provider Department Center  03/17/2019  2:00 PM Carolan Shiver, RD [facility]  05/11/2019  2:00 PM Corie Chiquito, PMHNP [facility] None    No orders of the defined types were placed in this encounter.   -------------------------------

## 2019-03-11 DIAGNOSIS — S93402D Sprain of unspecified ligament of left ankle, subsequent encounter: Secondary | ICD-10-CM | POA: Diagnosis not present

## 2019-03-11 DIAGNOSIS — F4323 Adjustment disorder with mixed anxiety and depressed mood: Secondary | ICD-10-CM | POA: Diagnosis not present

## 2019-03-16 ENCOUNTER — Ambulatory Visit (INDEPENDENT_AMBULATORY_CARE_PROVIDER_SITE_OTHER): Payer: BC Managed Care – PPO | Admitting: Family

## 2019-03-16 ENCOUNTER — Encounter: Payer: Self-pay | Admitting: Family

## 2019-03-16 DIAGNOSIS — B349 Viral infection, unspecified: Secondary | ICD-10-CM

## 2019-03-16 NOTE — Progress Notes (Signed)
Virtual Visit via Video Note  I connected with Margaret Golden on 03/16/19 at 12:00 PM EST by a video enabled telemedicine application and verified that I am speaking with the correct person using two identifiers.  Location: Patient: home Provider: home   I discussed the limitations of evaluation and management by telemedicine and the availability of in person appointments. The patient expressed understanding and agreed to proceed.  History of Present Illness:  Reports Wednesday last week she developed stuffy nose, cough. Then over the weekend developed sore throat, HA.  Reports + discomfort with cough/deep breath.  Denies wheezing.  Denies fever that she is aware.  Denies loss of taste or smell. She reports that she has not been at work for the last few weeks due to medical leave for her ankle.  She is following with orthopedics.   Past Medical History:  Diagnosis Date  . ADD (attention deficit disorder)   . Anxiety   . Depression   . Fatty liver   . History of chicken pox   . History of kidney stones   . RLS (restless legs syndrome)   . Thyroid disease    hypothyroid     Social History   Socioeconomic History  . Marital status: Single    Spouse name: Not on file  . Number of children: Not on file  . Years of education: 105  . Highest education level: Not on file  Occupational History  . Not on file  Tobacco Use  . Smoking status: Never Smoker  . Smokeless tobacco: Never Used  Substance and Sexual Activity  . Alcohol use: Never    Alcohol/week: 0.0 standard drinks  . Drug use: No  . Sexual activity: Never  Other Topics Concern  . Not on file  Social History Narrative   Unemployed   Lives with Dad   Seeking work   Only child   Dog 3   Lives in a one story house with father, has a cat-12/26/16-sjb   Social Determinants of Corporate investment banker Strain:   . Difficulty of Paying Living Expenses: Not on file  Food Insecurity:   . Worried About Patent examiner in the Last Year: Not on file  . Ran Out of Food in the Last Year: Not on file  Transportation Needs:   . Lack of Transportation (Medical): Not on file  . Lack of Transportation (Non-Medical): Not on file  Physical Activity:   . Days of Exercise per Week: Not on file  . Minutes of Exercise per Session: Not on file  Stress:   . Feeling of Stress : Not on file  Social Connections:   . Frequency of Communication with Friends and Family: Not on file  . Frequency of Social Gatherings with Friends and Family: Not on file  . Attends Religious Services: Not on file  . Active Member of Clubs or Organizations: Not on file  . Attends Banker Meetings: Not on file  . Marital Status: Not on file  Intimate Partner Violence:   . Fear of Current or Ex-Partner: Not on file  . Emotionally Abused: Not on file  . Physically Abused: Not on file  . Sexually Abused: Not on file    Past Surgical History:  Procedure Laterality Date  . KNEE ARTHROSCOPY Right 2007  . TONSILLECTOMY  2010  . WISDOM TOOTH EXTRACTION      Family History  Problem Relation Age of Onset  . Depression Mother   .  Depression Father   . Cancer Paternal Aunt        colon  . Cancer Paternal Uncle        "small cell cancer"  . Diabetes Maternal Grandfather   . Arthritis Maternal Grandfather   . Arthritis Maternal Grandmother   . Breast cancer Maternal Grandmother   . Arthritis Paternal Grandmother   . Arthritis Paternal Grandfather     Allergies  Allergen Reactions  . Augmentin [Amoxicillin-Pot Clavulanate]     hives    Current Outpatient Medications on File Prior to Visit  Medication Sig Dispense Refill  . aspirin-acetaminophen-caffeine (EXCEDRIN MIGRAINE) 250-250-65 MG tablet Take by mouth every 6 (six) hours as needed for headache.    . dicyclomine (BENTYL) 10 MG capsule Take 1 capsule (10 mg total) by mouth 4 (four) times daily -  before meals and at bedtime. 30 capsule 0  . Ferrous Sulfate  (IRON) 28 MG TABS Take 2 tablets by mouth daily.    Marland Kitchen gabapentin (NEURONTIN) 600 MG tablet Take 1 tablet (600 mg total) by mouth at bedtime. 90 tablet 0  . lamoTRIgine (LAMICTAL) 200 MG tablet Take 1 tablet (200 mg total) by mouth daily. 90 tablet 0  . levothyroxine (SYNTHROID) 175 MCG tablet Take 1 tablet (175 mcg total) by mouth daily before breakfast. 30 tablet 5  . meloxicam (MOBIC) 7.5 MG tablet TAKE ONE TABLET BY MOUTH DAILY 14 tablet 0  . Multiple Vitamins-Minerals (MULTIVITAMIN WITH MINERALS) tablet Take 1 tablet by mouth daily.    Marland Kitchen omeprazole (PRILOSEC) 20 MG capsule Take 2 capsules (40 mg total) by mouth daily. 60 capsule 5  . ondansetron (ZOFRAN) 4 MG tablet Take 1 tablet (4 mg total) by mouth every 8 (eight) hours as needed. 20 tablet 0  . sertraline (ZOLOFT) 100 MG tablet Take 2 tablets (200 mg total) by mouth daily. 180 tablet 0   No current facility-administered medications on file prior to visit.    There were no vitals taken for this visit.      Observations/Objective:   Gen: Awake, alert, no acute distress Resp: Breathing is even and non-labored Psych: calm/pleasant demeanor Neuro: Alert and Oriented x 3, + facial symmetry, speech is clear.   Assessment and Plan:  Viral illness- need to rule out COVID-19 infection. Pt is advised to go to our testing center for swab today.  She is advised on supportive measures and need to quarantine pending review of results and further recommendations. She is also advised to call if symptoms worsen or if symptoms fail to improve.  Follow Up Instructions:    I discussed the assessment and treatment plan with the patient. The patient was provided an opportunity to ask questions and all were answered. The patient agreed with the plan and demonstrated an understanding of the instructions.   The patient was advised to call back or seek an in-person evaluation if the symptoms worsen or if the condition fails to improve as  anticipated.  Nance Pear, NP

## 2019-03-17 ENCOUNTER — Ambulatory Visit: Payer: BC Managed Care – PPO | Admitting: Registered"

## 2019-03-18 ENCOUNTER — Ambulatory Visit: Payer: BC Managed Care – PPO | Attending: Internal Medicine

## 2019-03-18 ENCOUNTER — Other Ambulatory Visit: Payer: Self-pay

## 2019-03-18 DIAGNOSIS — Z20822 Contact with and (suspected) exposure to covid-19: Secondary | ICD-10-CM

## 2019-03-18 DIAGNOSIS — Z20828 Contact with and (suspected) exposure to other viral communicable diseases: Secondary | ICD-10-CM | POA: Diagnosis not present

## 2019-03-20 LAB — NOVEL CORONAVIRUS, NAA: SARS-CoV-2, NAA: NOT DETECTED

## 2019-03-23 DIAGNOSIS — S93402D Sprain of unspecified ligament of left ankle, subsequent encounter: Secondary | ICD-10-CM | POA: Diagnosis not present

## 2019-03-23 DIAGNOSIS — F4323 Adjustment disorder with mixed anxiety and depressed mood: Secondary | ICD-10-CM | POA: Diagnosis not present

## 2019-03-25 ENCOUNTER — Other Ambulatory Visit: Payer: Self-pay | Admitting: Family Medicine

## 2019-03-25 ENCOUNTER — Encounter: Payer: Self-pay | Admitting: Family

## 2019-03-25 DIAGNOSIS — K529 Noninfective gastroenteritis and colitis, unspecified: Secondary | ICD-10-CM

## 2019-03-26 ENCOUNTER — Ambulatory Visit: Payer: BC Managed Care – PPO | Admitting: Family Medicine

## 2019-03-26 NOTE — Telephone Encounter (Signed)
Patient statde she was having SOB, chest pain, dizzy, she stated she had a negative COVID test and does not know what was wrong, Dr. Larose Kells was aware and he stated she needed to be seen y the ER. Patient was advised to go to the ER immediatly she stated she would go.

## 2019-03-26 NOTE — Telephone Encounter (Deleted)
Looks like this was filled avou

## 2019-03-30 DIAGNOSIS — F4323 Adjustment disorder with mixed anxiety and depressed mood: Secondary | ICD-10-CM | POA: Diagnosis not present

## 2019-04-01 ENCOUNTER — Encounter: Payer: Self-pay | Admitting: Family Medicine

## 2019-04-01 ENCOUNTER — Other Ambulatory Visit: Payer: Self-pay

## 2019-04-01 ENCOUNTER — Ambulatory Visit (INDEPENDENT_AMBULATORY_CARE_PROVIDER_SITE_OTHER): Payer: BC Managed Care – PPO | Admitting: Family Medicine

## 2019-04-01 DIAGNOSIS — G43009 Migraine without aura, not intractable, without status migrainosus: Secondary | ICD-10-CM | POA: Diagnosis not present

## 2019-04-01 DIAGNOSIS — J069 Acute upper respiratory infection, unspecified: Secondary | ICD-10-CM | POA: Diagnosis not present

## 2019-04-01 DIAGNOSIS — R110 Nausea: Secondary | ICD-10-CM

## 2019-04-01 MED ORDER — PREDNISONE 20 MG PO TABS: 40.0000 mg | ORAL_TABLET | Freq: Every day | ORAL | 0 refills | Status: DC

## 2019-04-01 MED ORDER — ALBUTEROL SULFATE HFA 108 (90 BASE) MCG/ACT IN AERS: 2.0000 | INHALATION_SPRAY | Freq: Four times a day (QID) | RESPIRATORY_TRACT | 0 refills | Status: DC | PRN

## 2019-04-01 MED ORDER — OMEPRAZOLE 20 MG PO CPDR: 40.0000 mg | DELAYED_RELEASE_CAPSULE | Freq: Every day | ORAL | 2 refills | Status: DC

## 2019-04-01 MED ORDER — SUMATRIPTAN SUCCINATE 100 MG PO TABS: 100.0000 mg | ORAL_TABLET | ORAL | 0 refills | Status: DC | PRN

## 2019-04-01 MED ORDER — ONDANSETRON 4 MG PO TBDP: 4.0000 mg | ORAL_TABLET | Freq: Three times a day (TID) | ORAL | 0 refills | Status: DC | PRN

## 2019-04-01 NOTE — Progress Notes (Signed)
Chief Complaint  Patient presents with  . Chest Pain  . Migraine    Margaret Golden here for URI complaints. Due to COVID-19 pandemic, we are interacting via web portal for an electronic face-to-face visit. I verified patient's ID using 2 identifiers. Patient agreed to proceed with visit via this method. Patient is at home, I am at office. Patient and I are present for visit.   Duration: several weeks  Associated symptoms: sinus headache, sinus congestion, sinus pain, rhinorrhea, ear pain, sore throat, chest tightness, myalgia, coughing, and nausea, D/C Denies: itchy watery eyes, ear pain, ear drainage, wheezing, shortness of breath and vomiting Treatment to date: OTC cold meds Sick contacts: No  Tested neg for Covid 1.5 weeks ago.  Pt w hx of migraines. Excedrin usually helps, not so much now. Has a HA, but not a migraine currently. Associated nausea. She does not get auras. Describes pain as sharp and stabbing.   ROS:  Const: Denies fevers HEENT: As noted in HPI Lungs: +cough  Past Medical History:  Diagnosis Date  . ADD (attention deficit disorder)   . Anxiety   . Depression   . Fatty liver   . History of chicken pox   . History of kidney stones   . RLS (restless legs syndrome)   . Thyroid disease    hypothyroid   Exam No conversational dyspnea Age appropriate judgment and insight Nml affect and mood  URI with cough and congestion - Plan: predniSONE (DELTASONE) 20 MG tablet, albuterol (VENTOLIN HFA) 108 (90 Base) MCG/ACT inhaler  Nausea - Plan: ondansetron (ZOFRAN-ODT) 4 MG disintegrating tablet  Migraine without aura and without status migrainosus, not intractable - Plan: SUMAtriptan (IMITREX) 100 MG tablet  Orders as above. Continue to push fluids, practice good hand hygiene, cover mouth when coughing. F/u early next week if no improvement.  Pt voiced understanding and agreement to the plan.  [place], DO 04/01/19 2:06 PM

## 2019-04-03 DIAGNOSIS — G4730 Sleep apnea, unspecified: Secondary | ICD-10-CM

## 2019-04-03 HISTORY — DX: Sleep apnea, unspecified: G47.30

## 2019-04-06 ENCOUNTER — Other Ambulatory Visit: Payer: Self-pay

## 2019-04-06 ENCOUNTER — Emergency Department (HOSPITAL_BASED_OUTPATIENT_CLINIC_OR_DEPARTMENT_OTHER): Payer: BC Managed Care – PPO

## 2019-04-06 ENCOUNTER — Encounter (HOSPITAL_BASED_OUTPATIENT_CLINIC_OR_DEPARTMENT_OTHER): Payer: Self-pay | Admitting: *Deleted

## 2019-04-06 ENCOUNTER — Encounter: Payer: Self-pay | Admitting: Family Medicine

## 2019-04-06 DIAGNOSIS — R42 Dizziness and giddiness: Secondary | ICD-10-CM | POA: Insufficient documentation

## 2019-04-06 DIAGNOSIS — F419 Anxiety disorder, unspecified: Secondary | ICD-10-CM | POA: Insufficient documentation

## 2019-04-06 DIAGNOSIS — R002 Palpitations: Secondary | ICD-10-CM | POA: Diagnosis not present

## 2019-04-06 DIAGNOSIS — D509 Iron deficiency anemia, unspecified: Principal | ICD-10-CM | POA: Insufficient documentation

## 2019-04-06 DIAGNOSIS — F329 Major depressive disorder, single episode, unspecified: Secondary | ICD-10-CM | POA: Insufficient documentation

## 2019-04-06 DIAGNOSIS — R0789 Other chest pain: Secondary | ICD-10-CM | POA: Insufficient documentation

## 2019-04-06 DIAGNOSIS — K760 Fatty (change of) liver, not elsewhere classified: Secondary | ICD-10-CM | POA: Insufficient documentation

## 2019-04-06 DIAGNOSIS — N920 Excessive and frequent menstruation with regular cycle: Secondary | ICD-10-CM | POA: Insufficient documentation

## 2019-04-06 DIAGNOSIS — D649 Anemia, unspecified: Secondary | ICD-10-CM | POA: Diagnosis not present

## 2019-04-06 DIAGNOSIS — Z79899 Other long term (current) drug therapy: Secondary | ICD-10-CM | POA: Insufficient documentation

## 2019-04-06 DIAGNOSIS — Z20822 Contact with and (suspected) exposure to covid-19: Secondary | ICD-10-CM | POA: Insufficient documentation

## 2019-04-06 DIAGNOSIS — R0602 Shortness of breath: Secondary | ICD-10-CM | POA: Diagnosis not present

## 2019-04-06 DIAGNOSIS — Z79890 Hormone replacement therapy (postmenopausal): Secondary | ICD-10-CM | POA: Insufficient documentation

## 2019-04-06 DIAGNOSIS — Z6841 Body Mass Index (BMI) 40.0 and over, adult: Secondary | ICD-10-CM | POA: Insufficient documentation

## 2019-04-06 DIAGNOSIS — E039 Hypothyroidism, unspecified: Secondary | ICD-10-CM | POA: Diagnosis not present

## 2019-04-06 DIAGNOSIS — G43909 Migraine, unspecified, not intractable, without status migrainosus: Secondary | ICD-10-CM | POA: Diagnosis not present

## 2019-04-06 DIAGNOSIS — R05 Cough: Secondary | ICD-10-CM | POA: Diagnosis not present

## 2019-04-06 DIAGNOSIS — E669 Obesity, unspecified: Secondary | ICD-10-CM | POA: Insufficient documentation

## 2019-04-06 LAB — SARS CORONAVIRUS 2 AG (30 MIN TAT): SARS Coronavirus 2 Ag: NEGATIVE

## 2019-04-06 NOTE — ED Triage Notes (Signed)
Pt cont to c/o cough and congestion.

## 2019-04-07 ENCOUNTER — Observation Stay (HOSPITAL_BASED_OUTPATIENT_CLINIC_OR_DEPARTMENT_OTHER)
Admission: EM | Admit: 2019-04-07 | Discharge: 2019-04-08 | Disposition: A | Discharge status: Home / Self Care | Payer: BC Managed Care – PPO | Attending: Internal Medicine | Admitting: Internal Medicine

## 2019-04-07 ENCOUNTER — Encounter (HOSPITAL_COMMUNITY): Payer: Self-pay | Admitting: Internal Medicine

## 2019-04-07 ENCOUNTER — Other Ambulatory Visit: Payer: Self-pay

## 2019-04-07 DIAGNOSIS — R42 Dizziness and giddiness: Secondary | ICD-10-CM

## 2019-04-07 DIAGNOSIS — R0789 Other chest pain: Secondary | ICD-10-CM | POA: Diagnosis not present

## 2019-04-07 DIAGNOSIS — R0602 Shortness of breath: Secondary | ICD-10-CM | POA: Diagnosis not present

## 2019-04-07 DIAGNOSIS — R002 Palpitations: Secondary | ICD-10-CM | POA: Diagnosis not present

## 2019-04-07 DIAGNOSIS — Z79899 Other long term (current) drug therapy: Secondary | ICD-10-CM | POA: Diagnosis not present

## 2019-04-07 DIAGNOSIS — F329 Major depressive disorder, single episode, unspecified: Secondary | ICD-10-CM | POA: Diagnosis not present

## 2019-04-07 DIAGNOSIS — E039 Hypothyroidism, unspecified: Secondary | ICD-10-CM | POA: Diagnosis not present

## 2019-04-07 DIAGNOSIS — G43909 Migraine, unspecified, not intractable, without status migrainosus: Secondary | ICD-10-CM | POA: Diagnosis not present

## 2019-04-07 DIAGNOSIS — D649 Anemia, unspecified: Secondary | ICD-10-CM | POA: Diagnosis not present

## 2019-04-07 DIAGNOSIS — F419 Anxiety disorder, unspecified: Secondary | ICD-10-CM | POA: Diagnosis not present

## 2019-04-07 DIAGNOSIS — R05 Cough: Secondary | ICD-10-CM | POA: Diagnosis not present

## 2019-04-07 DIAGNOSIS — Z6841 Body Mass Index (BMI) 40.0 and over, adult: Secondary | ICD-10-CM | POA: Diagnosis not present

## 2019-04-07 DIAGNOSIS — Z79890 Hormone replacement therapy (postmenopausal): Secondary | ICD-10-CM | POA: Diagnosis not present

## 2019-04-07 DIAGNOSIS — D509 Iron deficiency anemia, unspecified: Secondary | ICD-10-CM | POA: Diagnosis not present

## 2019-04-07 DIAGNOSIS — K760 Fatty (change of) liver, not elsewhere classified: Secondary | ICD-10-CM | POA: Diagnosis not present

## 2019-04-07 DIAGNOSIS — Z20822 Contact with and (suspected) exposure to covid-19: Secondary | ICD-10-CM | POA: Diagnosis not present

## 2019-04-07 DIAGNOSIS — N920 Excessive and frequent menstruation with regular cycle: Secondary | ICD-10-CM | POA: Diagnosis not present

## 2019-04-07 DIAGNOSIS — E669 Obesity, unspecified: Secondary | ICD-10-CM | POA: Diagnosis not present

## 2019-04-07 LAB — CBC WITH DIFFERENTIAL/PLATELET
Abs Immature Granulocytes: 0.5 10*3/uL — ABNORMAL HIGH (ref 0.00–0.07)
Basophils Absolute: 0 10*3/uL (ref 0.0–0.1)
Basophils Relative: 0 %
Eosinophils Absolute: 0.1 10*3/uL (ref 0.0–0.5)
Eosinophils Relative: 1 %
HCT: 23.1 % — ABNORMAL LOW (ref 36.0–46.0)
Hemoglobin: 6.6 g/dL — CL (ref 12.0–15.0)
Immature Granulocytes: 3 %
Lymphocytes Relative: 26 %
Lymphs Abs: 3.9 10*3/uL (ref 0.7–4.0)
MCH: 22.3 pg — ABNORMAL LOW (ref 26.0–34.0)
MCHC: 28.6 g/dL — ABNORMAL LOW (ref 30.0–36.0)
MCV: 78 fL — ABNORMAL LOW (ref 80.0–100.0)
Monocytes Absolute: 0.8 10*3/uL (ref 0.1–1.0)
Monocytes Relative: 5 %
Neutro Abs: 9.7 10*3/uL — ABNORMAL HIGH (ref 1.7–7.7)
Neutrophils Relative %: 65 %
Platelets: 226 10*3/uL (ref 150–400)
RBC: 2.96 MIL/uL — ABNORMAL LOW (ref 3.87–5.11)
RDW: 14.6 % (ref 11.5–15.5)
Smear Review: NORMAL
WBC: 15.1 10*3/uL — ABNORMAL HIGH (ref 4.0–10.5)
nRBC: 0.2 % (ref 0.0–0.2)

## 2019-04-07 LAB — BASIC METABOLIC PANEL
Anion gap: 8 (ref 5–15)
BUN: 11 mg/dL (ref 6–20)
CO2: 25 mmol/L (ref 22–32)
Calcium: 7.5 mg/dL — ABNORMAL LOW (ref 8.9–10.3)
Chloride: 107 mmol/L (ref 98–111)
Creatinine, Ser: 0.74 mg/dL (ref 0.44–1.00)
GFR calc Af Amer: >60 mL/min (ref >60)
GFR calc non Af Amer: >60 mL/min (ref >60)
Glucose, Bld: 104 mg/dL — ABNORMAL HIGH (ref 70–99)
Potassium: 3.7 mmol/L (ref 3.5–5.1)
Sodium: 140 mmol/L (ref 135–145)

## 2019-04-07 LAB — PREGNANCY, URINE: Preg Test, Ur: NEGATIVE

## 2019-04-07 LAB — IRON AND TIBC
Iron: 10 ug/dL — ABNORMAL LOW (ref 28–170)
Saturation Ratios: 2 % — ABNORMAL LOW (ref 10.4–31.8)
TIBC: 442 ug/dL (ref 250–450)
UIBC: 432 ug/dL

## 2019-04-07 LAB — HCG, SERUM, QUALITATIVE: Preg, Serum: NEGATIVE

## 2019-04-07 LAB — ABO/RH: ABO/RH(D): O POS

## 2019-04-07 LAB — TROPONIN I (HIGH SENSITIVITY): Troponin I (High Sensitivity): <2 ng/L (ref <18)

## 2019-04-07 LAB — RETICULOCYTES
Immature Retic Fract: 21.9 % — ABNORMAL HIGH (ref 2.3–15.9)
RBC.: 2.85 MIL/uL — ABNORMAL LOW (ref 3.87–5.11)
Retic Count, Absolute: 94.5 10*3/uL (ref 19.0–186.0)
Retic Ct Pct: 3.3 % — ABNORMAL HIGH (ref 0.4–3.1)

## 2019-04-07 LAB — D-DIMER, QUANTITATIVE: D-Dimer, Quant: 0.35 ug/mL-FEU (ref 0.00–0.50)

## 2019-04-07 LAB — FOLATE: Folate: 17.8 ng/mL (ref >5.9)

## 2019-04-07 LAB — VITAMIN B12: Vitamin B-12: 239 pg/mL (ref 180–914)

## 2019-04-07 LAB — PREPARE RBC (CROSSMATCH)

## 2019-04-07 LAB — HIV ANTIBODY (ROUTINE TESTING W REFLEX): HIV Screen 4th Generation wRfx: NONREACTIVE

## 2019-04-07 LAB — FERRITIN: Ferritin: 6 ng/mL — ABNORMAL LOW (ref 11–307)

## 2019-04-07 MED ORDER — SERTRALINE HCL 100 MG PO TABS
200.0000 mg | ORAL_TABLET | Freq: Every day | ORAL | Status: DC
  Administered 2019-04-07 – 2019-04-08 (×2): 200 mg via ORAL
  Filled 2019-04-07 (×2): qty 8
  Filled 2019-04-07: qty 2

## 2019-04-07 MED ORDER — POLYETHYLENE GLYCOL 3350 17 G PO PACK: 17.0000 g | PACK | Freq: Every day | ORAL | Status: DC | PRN

## 2019-04-07 MED ORDER — ASPIRIN-ACETAMINOPHEN-CAFFEINE 250-250-65 MG PO TABS
2.0000 | ORAL_TABLET | Freq: Three times a day (TID) | ORAL | Status: DC | PRN
  Filled 2019-04-07 (×3): qty 2

## 2019-04-07 MED ORDER — DIPHENHYDRAMINE HCL 50 MG/ML IJ SOLN
25.0000 mg | Freq: Once | INTRAMUSCULAR | Status: AC
  Administered 2019-04-07: 25 mg via INTRAVENOUS
  Filled 2019-04-07: qty 1

## 2019-04-07 MED ORDER — ACETAMINOPHEN 650 MG RE SUPP: 650.0000 mg | Freq: Four times a day (QID) | RECTAL | Status: DC | PRN

## 2019-04-07 MED ORDER — METOCLOPRAMIDE HCL 5 MG/ML IJ SOLN
10.0000 mg | Freq: Once | INTRAMUSCULAR | Status: AC
  Administered 2019-04-07: 10 mg via INTRAVENOUS
  Filled 2019-04-07: qty 2

## 2019-04-07 MED ORDER — LEVOTHYROXINE SODIUM 25 MCG PO TABS
175.0000 ug | ORAL_TABLET | Freq: Every day | ORAL | Status: DC
  Administered 2019-04-07: 175 ug via ORAL
  Filled 2019-04-07 (×2): qty 3
  Filled 2019-04-07: qty 1
  Filled 2019-04-07 (×2): qty 3

## 2019-04-07 MED ORDER — VITAMIN B-12 1000 MCG PO TABS
500.0000 ug | ORAL_TABLET | Freq: Every day | ORAL | Status: DC
  Administered 2019-04-08: 500 ug via ORAL
  Filled 2019-04-07: qty 1

## 2019-04-07 MED ORDER — GABAPENTIN 600 MG PO TABS
600.0000 mg | ORAL_TABLET | Freq: Every day | ORAL | Status: DC
  Filled 2019-04-07 (×4): qty 1

## 2019-04-07 MED ORDER — ALBUTEROL SULFATE (2.5 MG/3ML) 0.083% IN NEBU: 2.5000 mg | INHALATION_SOLUTION | Freq: Four times a day (QID) | RESPIRATORY_TRACT | Status: DC | PRN

## 2019-04-07 MED ORDER — LEVOTHYROXINE SODIUM 25 MCG PO TABS
175.0000 ug | ORAL_TABLET | Freq: Every day | ORAL | Status: DC
  Administered 2019-04-08: 175 ug via ORAL
  Filled 2019-04-07: qty 1

## 2019-04-07 MED ORDER — KETOROLAC TROMETHAMINE 30 MG/ML IJ SOLN
30.0000 mg | Freq: Once | INTRAMUSCULAR | Status: AC
  Administered 2019-04-07: 30 mg via INTRAVENOUS
  Filled 2019-04-07: qty 1

## 2019-04-07 MED ORDER — ONDANSETRON HCL 4 MG PO TABS: 4.0000 mg | ORAL_TABLET | Freq: Four times a day (QID) | ORAL | Status: DC | PRN

## 2019-04-07 MED ORDER — ONDANSETRON HCL 4 MG/2ML IJ SOLN
4.0000 mg | Freq: Four times a day (QID) | INTRAMUSCULAR | Status: DC | PRN
  Administered 2019-04-07: 4 mg via INTRAVENOUS
  Filled 2019-04-07: qty 2

## 2019-04-07 MED ORDER — ALBUTEROL SULFATE HFA 108 (90 BASE) MCG/ACT IN AERS
2.0000 | INHALATION_SPRAY | Freq: Four times a day (QID) | RESPIRATORY_TRACT | Status: DC | PRN
  Filled 2019-04-07: qty 6.7

## 2019-04-07 MED ORDER — ADULT MULTIVITAMIN W/MINERALS CH
1.0000 | ORAL_TABLET | Freq: Every day | ORAL | Status: DC
  Administered 2019-04-07 – 2019-04-08 (×2): 1 via ORAL
  Filled 2019-04-07 (×4): qty 1

## 2019-04-07 MED ORDER — FERROUS SULFATE 325 (65 FE) MG PO TABS
325.0000 mg | ORAL_TABLET | Freq: Every day | ORAL | Status: DC
  Administered 2019-04-07: 325 mg via ORAL
  Filled 2019-04-07 (×4): qty 1

## 2019-04-07 MED ORDER — SODIUM CHLORIDE 0.9% IV SOLUTION: Freq: Once | INTRAVENOUS | Status: DC

## 2019-04-07 MED ORDER — PANTOPRAZOLE SODIUM 40 MG PO TBEC
40.0000 mg | DELAYED_RELEASE_TABLET | Freq: Every day | ORAL | Status: DC
  Administered 2019-04-07 – 2019-04-08 (×2): 40 mg via ORAL
  Filled 2019-04-07 (×4): qty 1

## 2019-04-07 MED ORDER — LAMOTRIGINE 100 MG PO TABS
200.0000 mg | ORAL_TABLET | Freq: Every day | ORAL | Status: DC
  Administered 2019-04-07 – 2019-04-08 (×2): 200 mg via ORAL
  Filled 2019-04-07: qty 1
  Filled 2019-04-07: qty 2
  Filled 2019-04-07 (×4): qty 1

## 2019-04-07 MED ORDER — ACETAMINOPHEN 325 MG PO TABS
650.0000 mg | ORAL_TABLET | Freq: Four times a day (QID) | ORAL | Status: DC | PRN
  Administered 2019-04-08 (×2): 650 mg via ORAL
  Filled 2019-04-07 (×2): qty 2

## 2019-04-07 NOTE — ED Notes (Signed)
Date and time results received: 04/07/19 0544 (use smartphrase ".now" to insert current time)  Test: hemoglobin Critical Value: 6.6  Name of Provider Notified: Dr Elesa Massed  Orders Received? Or Actions Taken?: pt will be admitted

## 2019-04-07 NOTE — ED Notes (Signed)
[facility] en route.  °

## 2019-04-07 NOTE — H&P (Addendum)
History and Physical:    Margaret Golden   LZJ:[id number] DOB: 06-05-1990 DOA: 04/07/2019  Referring MD/provider: Dr. Cyril Mourning Ward PCP: Debbrah Alar, NP   Patient coming from: Home  Chief Complaint: Chest pain, lightheadedness, palpitations  History of Present Illness:   Margaret Golden is an 29 y.o. female with medical history listed below presented to the emergency room [facility]) lightheadedness, palpitations and headache she said she has been having dizziness and palpitations for about 2 weeks now.  She also complained of exertional chest pain and shortness of breath.  She described the chest pain as 'a needle in the chest" and at times it felt like "bow constriction around the chest".  Chest pain is moderate in nature.  It is relieved with rest.  Chest pain is nonradiating.  Shortness of breath also improves with rest.  She has not had vomited blood or noticed any blood in her stools.  She says she normally has heavy menses and sometimes she passes clots. Her last menstrual period was about 2 weeks ago.  She was referred from [place] emergency department to [facility] for direct admission.  ED Course:  The patient was found to have severe anemia with a hemoglobin of 6.6 and ED physician requested that patient be admitted and the hospitalist service for transfusion.  ROS:   ROS all other systems reviewed were negative  Past Medical History:   Past Medical History:  Diagnosis Date  . ADD (attention deficit disorder)   . Anxiety   . Depression   . Fatty liver   . History of chicken pox   . History of kidney stones   . RLS (restless legs syndrome)   . Thyroid disease    hypothyroid    Past Surgical History:   Past Surgical History:  Procedure Laterality Date  . KNEE ARTHROSCOPY Right 2007  . TONSILLECTOMY  2010  . WISDOM TOOTH EXTRACTION      Social History:   Social History   Socioeconomic History  . Marital  status: Single    Spouse name: Not on file  . Number of children: Not on file  . Years of education: 74  . Highest education level: Not on file  Occupational History  . Not on file  Tobacco Use  . Smoking status: Never Smoker  . Smokeless tobacco: Never Used  Substance and Sexual Activity  . Alcohol use: Never    Alcohol/week: 0.0 standard drinks  . Drug use: No  . Sexual activity: Never  Other Topics Concern  . Not on file  Social History Narrative   Unemployed   Lives with Dad   Seeking work   Only child   Dog 3   Lives in a one story house with father, has a cat-12/26/16-sjb   Social Determinants of Radio broadcast assistant Strain:   . Difficulty of Paying Living Expenses: Not on file  Food Insecurity:   . Worried About Charity fundraiser in the Last Year: Not on file  . Ran Out of Food in the Last Year: Not on file  Transportation Needs:   . Lack of Transportation (Medical): Not on file  . Lack of Transportation (Non-Medical): Not on file  Physical Activity:   . Days of Exercise per Week: Not on file  . Minutes of Exercise per Session: Not on file  Stress:   . Feeling of Stress : Not on file  Social Connections:   .  Frequency of Communication with Friends and Family: Not on file  . Frequency of Social Gatherings with Friends and Family: Not on file  . Attends Religious Services: Not on file  . Active Member of Clubs or Organizations: Not on file  . Attends Banker Meetings: Not on file  . Marital Status: Not on file  Intimate Partner Violence:   . Fear of Current or Ex-Partner: Not on file  . Emotionally Abused: Not on file  . Physically Abused: Not on file  . Sexually Abused: Not on file    Allergies   Augmentin [amoxicillin-pot clavulanate]  Family history:   Family History  Problem Relation Age of Onset  . Depression Mother   . Depression Father   . Cancer Paternal Aunt        colon  . Cancer Paternal Uncle        "small cell  cancer"  . Diabetes Maternal Grandfather   . Arthritis Maternal Grandfather   . Arthritis Maternal Grandmother   . Breast cancer Maternal Grandmother   . Arthritis Paternal Grandmother   . Arthritis Paternal Grandfather     Current Medications:   Prior to Admission medications   Medication Sig Start Date End Date Taking? Authorizing Provider  albuterol (VENTOLIN HFA) 108 (90 Base) MCG/ACT inhaler Inhale 2 puffs into the lungs every 6 (six) hours as needed for wheezing or shortness of breath (Chest tightness). 04/01/19  Yes Sharlene Dory, DO  aspirin-acetaminophen-caffeine (EXCEDRIN MIGRAINE) [phone] MG tablet Take by mouth every 6 (six) hours as needed for headache.   Yes [provider]  Ferrous Sulfate (IRON) 28 MG TABS Take 2 tablets by mouth daily.   Yes [provider]  gabapentin (NEURONTIN) 600 MG tablet Take 1 tablet (600 mg total) by mouth at bedtime. 03/09/19 06/07/19 Yes Corie Chiquito, PMHNP  lamoTRIgine (LAMICTAL) 200 MG tablet Take 1 tablet (200 mg total) by mouth daily. 03/09/19 06/07/19 Yes Corie Chiquito, PMHNP  levothyroxine (SYNTHROID) 175 MCG tablet Take 1 tablet (175 mcg total) by mouth daily before breakfast. 02/19/18  Yes [name], NP  MOBIC 15 MG tablet Take 15 mg by mouth daily as needed for muscle pain. 03/23/19  Yes [provider]  Multiple Vitamins-Minerals (MULTIVITAMIN WITH MINERALS) tablet Take 1 tablet by mouth daily. 02/02/15  Yes [name], NP  omeprazole (PRILOSEC) 20 MG capsule Take 2 capsules (40 mg total) by mouth daily. 04/01/19  Yes Sharlene Dory, DO  sertraline (ZOLOFT) 100 MG tablet Take 2 tablets (200 mg total) by mouth daily. 03/09/19 06/07/19 Yes Corie Chiquito, PMHNP  SUMAtriptan (IMITREX) 100 MG tablet Take 1 tablet (100 mg total) by mouth every 2 (two) hours as needed for migraine. May repeat in 2 hours if headache persists or recurs. 04/01/19  Yes Sharlene Dory, DO     Physical Exam:   Vitals:   04/07/19 1600 04/07/19 1621 04/07/19 1628 04/07/19 1814  BP: 118/65  125/70 131/82  Pulse: 98  100 98  Resp: 12  16 18   Temp:  98.3 F (36.8 C)  98.4 F (36.9 C)  TempSrc:    Oral  SpO2: 100%  100% 100%  Weight:      Height:         Physical Exam: Blood pressure 131/82, pulse 98, temperature 98.4 F (36.9 C), temperature source Oral, resp. rate 18, height 5\' 7"  (1.702 m), weight (!) 167.8 kg, last menstrual period 03/23/2019, SpO2 100 %. Gen: No acute distress, morbidly obese.  Head: Normocephalic, atraumatic. Eyes: Pupils equal, round and reactive to light. Extraocular movements intact.  Pale conjunctiva, sclerae nonicteric.  Mouth: Moist mucous membranes Neck: Supple, no thyromegaly, no lymphadenopathy, no jugular venous distention. Chest: Lungs are clear to auscultation with good air movement. No rales, rhonchi or wheezes.  CV: Heart sounds are regular with an S1, S2. No murmurs, rubs, clicks, or gallops.  Abdomen: Soft, nontender, obese with normal active bowel sounds. No hepatosplenomegaly or palpable masses. Extremities: Extremities are without clubbing, or cyanosis. No edema. Pedal pulses 2+.  Skin: Warm and dry. No rashes, lesions or wounds. Neuro: Alert and oriented times 3; grossly nonfocal.  Psych: Insight is good and judgment is appropriate. Mood and affect normal.   Data Review:    Labs: Basic Metabolic Panel: Recent Labs  Lab 04/07/19 0513  NA 140  K 3.7  CL 107  CO2 25  GLUCOSE 104*  BUN 11  CREATININE 0.74  CALCIUM 7.5*   Liver Function Tests: No results for input(s): AST, ALT, ALKPHOS, BILITOT, PROT, ALBUMIN in the last 168 hours. No results for input(s): LIPASE, AMYLASE in the last 168 hours. No results for input(s): AMMONIA in the last 168 hours. CBC: Recent Labs  Lab 04/07/19 0513  WBC 15.1*  NEUTROABS 9.7*  HGB 6.6*  HCT 23.1*  MCV 78.0*  PLT 226   Cardiac Enzymes: No results for input(s): CKTOTAL,  CKMB, CKMBINDEX, TROPONINI in the last 168 hours.  BNP (last 3 results) No results for input(s): PROBNP in the last 8760 hours. CBG: No results for input(s): GLUCAP in the last 168 hours.  Urinalysis    Component Value Date/Time   COLORURINE YELLOW 04/22/2017 1014   APPEARANCEUR CLEAR 04/22/2017 1014   LABSPEC 1.025 04/22/2017 1014   PHURINE 5.5 04/22/2017 1014   GLUCOSEU NEGATIVE 04/22/2017 1014   HGBUR NEGATIVE 04/22/2017 1014   BILIRUBINUR NEGATIVE 04/22/2017 1014   BILIRUBINUR neg 02/27/2017 1201   KETONESUR NEGATIVE 04/22/2017 1014   PROTEINUR neg 02/27/2017 1201   PROTEINUR NEGATIVE 05/01/2015 1015   UROBILINOGEN 0.2 04/22/2017 1014   NITRITE NEGATIVE 04/22/2017 1014   LEUKOCYTESUR NEGATIVE 04/22/2017 1014      Radiographic Studies: DG Chest 2 View  Result Date: 04/06/2019 CLINICAL DATA:  Cough and congestion EXAM: CHEST - 2 VIEW COMPARISON:  11/30/2015 FINDINGS: The heart size and mediastinal contours are within normal limits. Both lungs are clear. The visualized skeletal structures are unremarkable. IMPRESSION: No active cardiopulmonary disease. Electronically Signed   By: Alcide Clever M.D.   On: 04/06/2019 23:15    EKG: Independently reviewed.  Normal sinus rhythm, low voltage   Assessment/Plan:   Active Problems:   Symptomatic anemia   Body mass index is 57.95 kg/m.  (Morbid obesity)  Severe symptomatic anemia/iron deficiency anemia: Admit to MedSurg.  Transfuse 2 units of packed red blood cells.  Risks, benefits of blood transfusion were discussed with the patient and she verbalized understanding and agreed to blood transfusion.  Iron studies is suggestive of iron deficiency anemia.  Vitamin B12 level is borderline low.  Continue ferrous sulfate and start vitamin B12 supplement.  Dizziness, palpitations and exertional chest pain shortness of breath: These are symptoms likely secondary to severe anemia.  Symptoms expected to improve with blood transfusion.   D-dimer and troponins were negative.  Menorrhagia: This is the likely cause of her anemia.  Outpatient follow-up with gynecologist recommended.  Migraine headache with flareup: Continue analgesics, Lamictal  Hypothyroidism: Continue Synthroid  Other information:   DVT prophylaxis:  SCDs Code Status: Full code. Family Communication: Plan discussed with patient  Disposition Plan: Possible discharge to home tomorrow Consults called: None Admission status: Observation   The medical decision making on this patient was of high complexity and the patient is at high risk for clinical deterioration, therefore this is a level 3 visit.   Time spent 50 minutes  Jakorian Marengo [facility]   How to contact the [facility] Attending or Consulting provider 7A - 7P or covering provider during after hours 7P -7A, for this patient?   1. Check the care team in [facility] and look for a) attending/consulting [facility] provider listed and b) the [facility] team listed 2. Log into [email or web address] and use [facility] universal password to access. If you do not have the password, please contact the hospital operator. 3. Locate the [facility] provider you are looking for under [facility] and page to a number that you can be directly reached. 4. If you still have difficulty reaching the provider, please page the [facility] (Director on Call) for the Hospitalists listed on amion for assistance.  04/07/2019, 6:28 PM

## 2019-04-07 NOTE — ED Notes (Signed)
LM for Floor RN Crystal. RN was in isolation room and will call back per sec

## 2019-04-07 NOTE — ED Notes (Signed)
[facility] pharmacy called and requested home meds be sent over via courier.  

## 2019-04-07 NOTE — ED Provider Notes (Signed)
TIME SEEN: 4:37 AM  CHIEF COMPLAINT: Chest pain, shortness of breath, cough  HPI: Patient is a 29 year old female with history of obesity, hypothyroidism who presents to the emergency department with chest tightness, lightheadedness and shortness of breath that occurs with any type of exertion or movement.  Also has had dry cough, nasal congestion but no fever or known Covid exposures.  No nausea, vomiting or diarrhea.  No history of PE or DVT.  No calf swelling or calf tenderness.  ROS: See HPI Constitutional: no fever  Eyes: no drainage  ENT: no runny nose   Cardiovascular:   chest pain  Resp:  SOB  GI: no vomiting GU: no dysuria Integumentary: no rash  Allergy: no hives  Musculoskeletal: no leg swelling  Neurological: no slurred speech ROS otherwise negative  PAST MEDICAL HISTORY/PAST SURGICAL HISTORY:  Past Medical History:  Diagnosis Date  . ADD (attention deficit disorder)   . Anxiety   . Depression   . Fatty liver   . History of chicken pox   . History of kidney stones   . RLS (restless legs syndrome)   . Thyroid disease    hypothyroid    MEDICATIONS:  Prior to Admission medications   Medication Sig Start Date End Date Taking? Authorizing Provider  albuterol (VENTOLIN HFA) 108 (90 Base) MCG/ACT inhaler Inhale 2 puffs into the lungs every 6 (six) hours as needed for wheezing or shortness of breath (Chest tightness). 04/01/19   Shelda Pal, DO  aspirin-acetaminophen-caffeine (EXCEDRIN MIGRAINE) [phone] MG tablet Take by mouth every 6 (six) hours as needed for headache.    [provider]  Ferrous Sulfate (IRON) 28 MG TABS Take 2 tablets by mouth daily.    [provider]  gabapentin (NEURONTIN) 600 MG tablet Take 1 tablet (600 mg total) by mouth at bedtime. 03/09/19 06/07/19  Thayer Headings, PMHNP  lamoTRIgine (LAMICTAL) 200 MG tablet Take 1 tablet (200 mg total) by mouth daily. 03/09/19 06/07/19  Thayer Headings, PMHNP  levothyroxine  (SYNTHROID) 175 MCG tablet Take 1 tablet (175 mcg total) by mouth daily before breakfast. 02/19/18   Debbrah Alar, NP  Multiple Vitamins-Minerals (MULTIVITAMIN WITH MINERALS) tablet Take 1 tablet by mouth daily. 02/02/15   Debbrah Alar, NP  omeprazole (PRILOSEC) 20 MG capsule Take 2 capsules (40 mg total) by mouth daily. 04/01/19   Shelda Pal, DO  ondansetron (ZOFRAN) 4 MG tablet Take 1 tablet (4 mg total) by mouth every 8 (eight) hours as needed. 11/07/18   Shelda Pal, DO  ondansetron (ZOFRAN-ODT) 4 MG disintegrating tablet Take 1 tablet (4 mg total) by mouth every 8 (eight) hours as needed for nausea or vomiting. 04/01/19   Shelda Pal, DO  sertraline (ZOLOFT) 100 MG tablet Take 2 tablets (200 mg total) by mouth daily. 03/09/19 06/07/19  Thayer Headings, PMHNP  SUMAtriptan (IMITREX) 100 MG tablet Take 1 tablet (100 mg total) by mouth every 2 (two) hours as needed for migraine. May repeat in 2 hours if headache persists or recurs. 04/01/19   Shelda Pal, DO    ALLERGIES:  Allergies  Allergen Reactions  . Augmentin [Amoxicillin-Pot Clavulanate]     hives    SOCIAL HISTORY:  Social History   Tobacco Use  . Smoking status: Never Smoker  . Smokeless tobacco: Never Used  Substance Use Topics  . Alcohol use: Never    Alcohol/week: 0.0 standard drinks    FAMILY HISTORY: Family History  Problem Relation Age of Onset  . Depression  Mother   . Depression Father   . Cancer Paternal Aunt        colon  . Cancer Paternal Uncle        "small cell cancer"  . Diabetes Maternal Grandfather   . Arthritis Maternal Grandfather   . Arthritis Maternal Grandmother   . Breast cancer Maternal Grandmother   . Arthritis Paternal Grandmother   . Arthritis Paternal Grandfather     EXAM: BP (!) 142/69 (BP Location: Right Arm)   Pulse (!) 103   Temp 98 F (36.7 C) (Oral)   Resp 20   Ht 5\' 7"  (1.702 m)   Wt (!) 167.8 kg   LMP 03/23/2019    SpO2 100%   BMI 57.95 kg/m  CONSTITUTIONAL: Alert and oriented and responds appropriately to questions.  Obese HEAD: Normocephalic EYES: Conjunctivae clear, pupils appear equal, EOM appear intact ENT: normal nose; moist mucous membranes NECK: Supple, normal ROM CARD: Regular and tachycardic; S1 and S2 appreciated; no murmurs, no clicks, no rubs, no gallops RESP: Normal chest excursion without splinting or tachypnea; breath sounds clear and equal bilaterally; no wheezes, no rhonchi, no rales, no hypoxia or respiratory distress, speaking full sentences ABD/GI: Normal bowel sounds; non-distended; soft, non-tender, no rebound, no guarding, no peritoneal signs, no hepatosplenomegaly BACK:  The back appears normal EXT: Normal ROM in all joints; no deformity noted, no edema; no cyanosis SKIN: Normal color for age and race; warm; no rash on exposed skin NEURO: Moves all extremities equally PSYCH: The patient's mood and manner are appropriate.   MEDICAL DECISION MAKING: Patient here with concerns for chest tightness, lightheadedness, shortness of breath.  Differential includes anemia, PE, COVID-19.  She was concerned about pneumonia but her chest x-ray is clear.  No hypoxia or increased work of breathing here.  Rapid Covid antigen negative.  Will send outpatient Covid test.  Will obtain labs to check electrolytes and hemoglobin.  We will also check D-dimer and troponin.  Low suspicion for ACS or dissection.  EKG shows no ischemic change.  ED PROGRESS: Patient's hemoglobin has come back at 6.6.  Likely secondary to heavy menstrual cycles.  Denies bloody stools or melena.  Patient will require blood transfusion and admission to the hospital.   Troponin negative.  D-dimer negative.  Electrolytes normal.   6:35 AM  Discussed patient's case with hospitalist, Dr. 03/25/2019.  I have recommended admission and patient (and family if present) agree with this plan. Admitting physician will place admission  orders.   Confirmed with charge nurse that we only have 2 units of emergent blood here at [facility].  Patient does not meet criteria for emergent blood as she is hemodynamically stable and not actively bleeding.  She is not currently on her menstrual cycle.  Discussed with lab who states they were unable to chorea type and screen to [facility] to obtain a unit of blood to be courier back here.  Patient has been updated with plan and likely delay secondary to no inpatient beds available currently.  I reviewed all nursing notes, vitals, pertinent previous records and interpreted all EKGs, lab and urine results, imaging (as available).     Date: 04/07/2019 435am  Rate: 97  Rhythm: normal sinus rhythm  QRS Axis: normal  Intervals: normal  ST/T Wave abnormalities: normal  Conduction Disutrbances: none  Narrative Interpretation: unremarkable    CRITICAL CARE Performed by: 06/05/2019   Total critical care time: 45 minutes  Critical care time was exclusive of  separately billable procedures and treating other patients.  Critical care was necessary to treat or prevent imminent or life-threatening deterioration.  Critical care was time spent personally by me on the following activities: development of treatment plan with patient and/or surrogate as well as nursing, discussions with consultants, evaluation of patient's response to treatment, examination of patient, obtaining history from patient or surrogate, ordering and performing treatments and interventions, ordering and review of laboratory studies, ordering and review of radiographic studies, pulse oximetry and re-evaluation of patient's condition.      VERYL ABRIL was evaluated in Emergency Department on 04/07/2019 for the symptoms described in the history of present illness. She was evaluated in the context of the global COVID-19 pandemic, which necessitated consideration that the patient might be at risk for infection  with the SARS-CoV-2 virus that causes COVID-19. Institutional protocols and algorithms that pertain to the evaluation of patients at risk for COVID-19 are in a state of rapid change based on information released by regulatory bodies including the CDC and federal and state organizations. These policies and algorithms were followed during the patient's care in the ED.  Patient was seen wearing N95, face shield, gloves, gown.    Shavonta Gossen, Layla Maw, DO 04/07/19 [phone]

## 2019-04-07 NOTE — ED Notes (Signed)
Medications sent in plastic bag with pat via [facility] 325mg  ferrous sulfate 1x lamictal 200mg  1 x ventolinHFA inhaler 2 x gabapentin 600mg  16 x sertraline 25mg  ea 2x multivitamin 1x synthroid 2x pantroprazole 40 mg

## 2019-04-08 DIAGNOSIS — R002 Palpitations: Secondary | ICD-10-CM | POA: Diagnosis not present

## 2019-04-08 DIAGNOSIS — R0789 Other chest pain: Secondary | ICD-10-CM | POA: Diagnosis not present

## 2019-04-08 DIAGNOSIS — D509 Iron deficiency anemia, unspecified: Secondary | ICD-10-CM | POA: Diagnosis not present

## 2019-04-08 DIAGNOSIS — Z20822 Contact with and (suspected) exposure to covid-19: Secondary | ICD-10-CM | POA: Diagnosis not present

## 2019-04-08 DIAGNOSIS — K760 Fatty (change of) liver, not elsewhere classified: Secondary | ICD-10-CM | POA: Diagnosis not present

## 2019-04-08 DIAGNOSIS — E669 Obesity, unspecified: Secondary | ICD-10-CM | POA: Diagnosis not present

## 2019-04-08 DIAGNOSIS — E039 Hypothyroidism, unspecified: Secondary | ICD-10-CM | POA: Diagnosis not present

## 2019-04-08 DIAGNOSIS — Z79899 Other long term (current) drug therapy: Secondary | ICD-10-CM | POA: Diagnosis not present

## 2019-04-08 DIAGNOSIS — G43909 Migraine, unspecified, not intractable, without status migrainosus: Secondary | ICD-10-CM | POA: Diagnosis not present

## 2019-04-08 DIAGNOSIS — N920 Excessive and frequent menstruation with regular cycle: Secondary | ICD-10-CM | POA: Diagnosis not present

## 2019-04-08 DIAGNOSIS — R05 Cough: Secondary | ICD-10-CM | POA: Diagnosis not present

## 2019-04-08 DIAGNOSIS — F329 Major depressive disorder, single episode, unspecified: Secondary | ICD-10-CM | POA: Diagnosis not present

## 2019-04-08 DIAGNOSIS — Z6841 Body Mass Index (BMI) 40.0 and over, adult: Secondary | ICD-10-CM | POA: Diagnosis not present

## 2019-04-08 DIAGNOSIS — F419 Anxiety disorder, unspecified: Secondary | ICD-10-CM | POA: Diagnosis not present

## 2019-04-08 DIAGNOSIS — D649 Anemia, unspecified: Secondary | ICD-10-CM | POA: Diagnosis not present

## 2019-04-08 DIAGNOSIS — R42 Dizziness and giddiness: Secondary | ICD-10-CM | POA: Diagnosis not present

## 2019-04-08 DIAGNOSIS — R0602 Shortness of breath: Secondary | ICD-10-CM | POA: Diagnosis not present

## 2019-04-08 LAB — NOVEL CORONAVIRUS, NAA (HOSP ORDER, SEND-OUT TO REF LAB; TAT 18-24 HRS): SARS-CoV-2, NAA: NOT DETECTED

## 2019-04-08 LAB — CBC
HCT: 28.7 % — ABNORMAL LOW (ref 36.0–46.0)
Hemoglobin: 8.4 g/dL — ABNORMAL LOW (ref 12.0–15.0)
MCH: 22.8 pg — ABNORMAL LOW (ref 26.0–34.0)
MCHC: 29.3 g/dL — ABNORMAL LOW (ref 30.0–36.0)
MCV: 77.8 fL — ABNORMAL LOW (ref 80.0–100.0)
Platelets: 190 10*3/uL (ref 150–400)
RBC: 3.69 MIL/uL — ABNORMAL LOW (ref 3.87–5.11)
RDW: 15.3 % (ref 11.5–15.5)
WBC: 11.7 10*3/uL — ABNORMAL HIGH (ref 4.0–10.5)
nRBC: 0.3 % — ABNORMAL HIGH (ref 0.0–0.2)

## 2019-04-08 MED ORDER — GABAPENTIN 300 MG PO CAPS
600.0000 mg | ORAL_CAPSULE | Freq: Every day | ORAL | Status: DC
  Administered 2019-04-08: 600 mg via ORAL
  Filled 2019-04-08: qty 2

## 2019-04-08 MED ORDER — FERROUS SULFATE 325 (65 FE) MG PO TABS: 325.0000 mg | ORAL_TABLET | Freq: Two times a day (BID) | ORAL | 1 refills | Status: DC

## 2019-04-08 MED ORDER — SODIUM CHLORIDE 0.9 % IV SOLN
510.0000 mg | Freq: Once | INTRAVENOUS | Status: AC
  Administered 2019-04-08: 510 mg via INTRAVENOUS
  Filled 2019-04-08: qty 17

## 2019-04-08 MED ORDER — FERROUS SULFATE 325 (65 FE) MG PO TABS: 325.0000 mg | ORAL_TABLET | Freq: Every day | ORAL | Status: DC

## 2019-04-08 NOTE — Discharge Summary (Signed)
Physician Discharge Summary  Margaret Golden:[id number] DOB: 25-Feb-1991 DOA: 04/07/2019  PCP: Debbrah Alar, NP  Admit date: 04/07/2019 Discharge date: 04/08/2019  Admitted From: Home Disposition:  Home  Discharge Condition:Stable CODE STATUS:FULL Diet recommendation: Regular  Brief/Interim Summary:  Margaret Golden is an 29 y.o. female with medical history listed below presented to the emergency room [facility]) lightheadedness, palpitations and headache she said she has been having dizziness and palpitations for about 2 weeks now.  She also complained of exertional chest pain and shortness of breath.  She described the chest pain as 'a needle in the chest" and at times it felt like "bow constriction around the chest".  Chest pain is moderate in nature.  It is relieved with rest.  Chest pain is nonradiating.  Shortness of breath also improves with rest.  She has not had vomited blood or noticed any blood in her stools.  She says she normally has heavy menses and sometimes she passes clots. Her last menstrual period was about 2 weeks ago.  She was referred from [place] emergency department to [facility] for direct admission.  ED Course:  The patient was found to have severe anemia with a hemoglobin of 6.6 and ED physician requested that patient be admitted and the hospitalist service for transfusion.   Hospital Course:  Her hospital course remained stable.  Her hemoglobin was found to be in the range of 6 on presentation.  She was transfused with 2 units of PRBC and her hemoglobin today at this morning is in the range of 8.  She does not have any active bleeding at present.  She has long history of menorrhagia.  She is hemodynamically stable for discharge to home today.  She needs to follow-up with gynecology as an outpatient as soon as possible.  Iron studies also showed low iron so she was given IV infusion of iron.  Continue iron supplementation  orally on discharge.  Following problems were addressed during her hospitalization:  Severe symptomatic anemia/iron deficiency anemia: Management and plan as above  Dizziness, palpitations and exertional chest pain shortness of breath: These are symptoms likely secondary to severe anemia.    Currently free of these symptoms.  Menorrhagia: This is the likely cause of her anemia.  Outpatient follow-up with gynecologist recommended.  Migraine headache with flareup: Continue home meds  Hypothyroidism: Continue Synthroid  Discharge Diagnoses:  Active Problems:   Symptomatic anemia    Discharge Instructions  Discharge Instructions    Diet - low sodium heart healthy   Complete by: As directed    Discharge instructions   Complete by: As directed    1)Please follow up with your PCP as an outpatient and do a CBC test during the follow-up 2)Continue taking iron pills. 3)Follow up with Gynecology as an outpatient.   Increase activity slowly   Complete by: As directed      Allergies as of 04/08/2019      Reactions   Augmentin [amoxicillin-pot Clavulanate]    hives      Medication List    TAKE these medications   albuterol 108 (90 Base) MCG/ACT inhaler Commonly known as: VENTOLIN HFA Inhale 2 puffs into the lungs every 6 (six) hours as needed for wheezing or shortness of breath (Chest tightness).   aspirin-acetaminophen-caffeine 811-914-78 MG tablet Commonly known as: EXCEDRIN MIGRAINE Take by mouth every 6 (six) hours as needed for headache.   ferrous sulfate 325 (65 FE) MG tablet Take  1 tablet (325 mg total) by mouth 2 (two) times daily with a meal. What changed:   medication strength  how much to take  when to take this   gabapentin 600 MG tablet Commonly known as: Neurontin Take 1 tablet (600 mg total) by mouth at bedtime.   lamoTRIgine 200 MG tablet Commonly known as: LAMICTAL Take 1 tablet (200 mg total) by mouth daily.   levothyroxine 175 MCG  tablet Commonly known as: Synthroid Take 1 tablet (175 mcg total) by mouth daily before breakfast.   Mobic 15 MG tablet Generic drug: meloxicam Take 15 mg by mouth daily as needed for muscle pain.   multivitamin with minerals tablet Take 1 tablet by mouth daily.   omeprazole 20 MG capsule Commonly known as: PRILOSEC Take 2 capsules (40 mg total) by mouth daily.   sertraline 100 MG tablet Commonly known as: ZOLOFT Take 2 tablets (200 mg total) by mouth daily.   SUMAtriptan 100 MG tablet Commonly known as: IMITREX Take 1 tablet (100 mg total) by mouth every 2 (two) hours as needed for migraine. May repeat in 2 hours if headache persists or recurs.      Follow-up Information    Debbrah Alar, NP. Schedule an appointment as soon as possible for a visit in 1 week(s).   Specialty: Internal Medicine Contact information: [address] [place] [address] [phone]          Allergies  Allergen Reactions  . Augmentin [Amoxicillin-Pot Clavulanate]     hives    Consultations:  None   Procedures/Studies: DG Chest 2 View  Result Date: 04/06/2019 CLINICAL DATA:  Cough and congestion EXAM: CHEST - 2 VIEW COMPARISON:  11/30/2015 FINDINGS: The heart size and mediastinal contours are within normal limits. Both lungs are clear. The visualized skeletal structures are unremarkable. IMPRESSION: No active cardiopulmonary disease. Electronically Signed   By: Inez Catalina M.D.   On: 04/06/2019 23:15       Subjective: Patient seen and examined at the bedside this morning.  Hemodynamically stable for discharge today.  Discharge Exam: Vitals:   04/08/19 0249 04/08/19 0545  BP: 121/61 128/69  Pulse: 86 83  Resp: 16 18  Temp: 98.3 F (36.8 C) 98.5 F (36.9 C)  SpO2: 100% 99%   Vitals:   04/08/19 0058 04/08/19 0204 04/08/19 0249 04/08/19 0545  BP: (!) 142/75 129/74 121/61 128/69  Pulse: 88 88 86 83  Resp: '16 16 16 18  ' Temp: 98.4 F (36.9 C) 98.6  F (37 C) 98.3 F (36.8 C) 98.5 F (36.9 C)  TempSrc:  Oral Oral Oral  SpO2: 99% 100% 100% 99%  Weight:      Height:        General: Pt is alert, awake, not in acute distress, morbidly obese Cardiovascular: RRR, S1/S2 +, no rubs, no gallops Respiratory: CTA bilaterally, no wheezing, no rhonchi Abdominal: Soft, NT, ND, bowel sounds + Extremities: no edema, no cyanosis    The results of significant diagnostics from this hospitalization (including imaging, microbiology, ancillary and laboratory) are listed below for reference.     Microbiology: Recent Results (from the past 240 hour(s))  SARS Coronavirus 2 Ag (30 min TAT) - Nasal Swab (BD Veritor Kit)     Status: None   Collection Time: 04/06/19 10:19 PM   Specimen: Nasal Swab (BD Veritor Kit)  Result Value Ref Range Status   SARS Coronavirus 2 Ag NEGATIVE NEGATIVE Final    Comment: (NOTE) SARS-CoV-2 antigen  NOT DETECTED.  Negative results are presumptive.  Negative results do not preclude SARS-CoV-2 infection and should not be used as the sole basis for treatment or other patient management decisions, including infection  control decisions, particularly in the presence of clinical signs and  symptoms consistent with COVID-19, or in those who have been in contact with the virus.  Negative results must be combined with clinical observations, patient history, and epidemiological information. The expected result is Negative. Fact Sheet for Patients: PodPark.tn Fact Sheet for Healthcare Providers: GiftContent.is This test is not yet approved or cleared by the [place] FDA and  has been authorized for detection and/or diagnosis of SARS-CoV-2 by FDA under an Emergency Use Authorization (EUA).  This EUA will remain in effect (meaning this test can be used) for the duration of  the COVID-19 de claration under Section 564(b)(1) of the Act, 21 U.S.C. section  360bbb-3(b)(1), unless the authorization is terminated or revoked sooner. Performed at [facility], [address] [place], [place] [address]   Novel Coronavirus, NAA [facility] order, Send-out to Ref Lab; TAT 18-24 hrs     Status: None   Collection Time: 04/07/19  5:13 AM   Specimen: Nasopharyngeal Swab; Respiratory  Result Value Ref Range Status   SARS-CoV-2, NAA NOT DETECTED NOT DETECTED Final    Comment: (NOTE) This nucleic acid amplification test was developed and its performance characteristics determined by [employer] and [employer]. Nucleic acid amplification tests include PCR and TMA. This test has not been FDA cleared or approved. This test has been authorized by FDA under an Emergency Use Authorization (EUA). This test is only authorized for the duration of time the declaration that circumstances exist justifying the authorization of the emergency use of in vitro diagnostic tests for detection of SARS-CoV-2 virus and/or diagnosis of COVID-19 infection under section 564(b)(1) of the Act, 21 U.S.C. 569VXY-8(A) (1), unless the authorization is terminated or revoked sooner. When diagnostic testing is negative, the possibility of a false negative result should be considered in the context of a patient's recent exposures and the presence of clinical signs and symptoms consistent with COVID-19. An individual without symptoms of COVID- 19 and who is not shedding SARS-CoV-2 vi rus would expect to have a negative (not detected) result in this assay. Performed At: [facility] [address] [place], [place] [id number] Rush Farmer MD LM:[id number]    [place]  Corrected    Comment: Performed at [facility], [address] [place], [place] [address] CORRECTED ON 01/05 AT 0525: PREVIOUSLY REPORTED AS NASOPHARYNGEAL      Labs: BNP (last 3 results) No results for input(s): BNP in the last 8760 hours. Basic Metabolic Panel: Recent  Labs  Lab 04/07/19 0513  NA 140  K 3.7  CL 107  CO2 25  GLUCOSE 104*  BUN 11  CREATININE 0.74  CALCIUM 7.5*   Liver Function Tests: No results for input(s): AST, ALT, ALKPHOS, BILITOT, PROT, ALBUMIN in the last 168 hours. No results for input(s): LIPASE, AMYLASE in the last 168 hours. No results for input(s): AMMONIA in the last 168 hours. CBC: Recent Labs  Lab 04/07/19 0513 04/08/19 0741  WBC 15.1* 11.7*  NEUTROABS 9.7*  --   HGB 6.6* 8.4*  HCT 23.1* 28.7*  MCV 78.0* 77.8*  PLT 226 190   Cardiac Enzymes: No results for input(s): CKTOTAL, CKMB, CKMBINDEX, TROPONINI in the last 168 hours. BNP: Invalid input(s): POCBNP CBG: No results for input(s): GLUCAP  in the last 168 hours. D-Dimer Recent Labs    04/07/19 0513  DDIMER 0.35   Hgb A1c No results for input(s): HGBA1C in the last 72 hours. Lipid Profile No results for input(s): CHOL, HDL, LDLCALC, TRIG, CHOLHDL, LDLDIRECT in the last 72 hours. Thyroid function studies No results for input(s): TSH, T4TOTAL, T3FREE, THYROIDAB in the last 72 hours.  Invalid input(s): FREET3 Anemia work up Recent Labs    04/07/19 0807 04/07/19 0808  VITAMINB12 239  --   FOLATE  --  17.8  FERRITIN 6*  --   TIBC 442  --   IRON 10*  --   RETICCTPCT  --  3.3*   Urinalysis    Component Value Date/Time   COLORURINE YELLOW 04/22/2017 1014   APPEARANCEUR CLEAR 04/22/2017 1014   LABSPEC 1.025 04/22/2017 1014   PHURINE 5.5 04/22/2017 1014   GLUCOSEU NEGATIVE 04/22/2017 1014   HGBUR NEGATIVE 04/22/2017 1014   BILIRUBINUR NEGATIVE 04/22/2017 1014   BILIRUBINUR neg 02/27/2017 1201   KETONESUR NEGATIVE 04/22/2017 1014   PROTEINUR neg 02/27/2017 1201   [place] 05/01/2015 1015   UROBILINOGEN 0.2 04/22/2017 1014   NITRITE NEGATIVE 04/22/2017 [place] 04/22/2017 1014   Sepsis Labs Invalid input(s): PROCALCITONIN,  WBC,  LACTICIDVEN Microbiology Recent Results (from the past 240 hour(s))  SARS  Coronavirus 2 Ag (30 min TAT) - Nasal Swab (BD Veritor Kit)     Status: None   Collection Time: 04/06/19 10:19 PM   Specimen: Nasal Swab (BD Veritor Kit)  Result Value Ref Range Status   SARS Coronavirus 2 Ag NEGATIVE NEGATIVE Final    Comment: (NOTE) SARS-CoV-2 antigen NOT DETECTED.  Negative results are presumptive.  Negative results do not preclude SARS-CoV-2 infection and should not be used as the sole basis for treatment or other patient management decisions, including infection  control decisions, particularly in the presence of clinical signs and  symptoms consistent with COVID-19, or in those who have been in contact with the virus.  Negative results must be combined with clinical observations, patient history, and epidemiological information. The expected result is Negative. Fact Sheet for Patients: PodPark.tn Fact Sheet for Healthcare Providers: GiftContent.is This test is not yet approved or cleared by the [place] FDA and  has been authorized for detection and/or diagnosis of SARS-CoV-2 by FDA under an Emergency Use Authorization (EUA).  This EUA will remain in effect (meaning this test can be used) for the duration of  the COVID-19 de claration under Section 564(b)(1) of the Act, 21 U.S.C. section 360bbb-3(b)(1), unless the authorization is terminated or revoked sooner. Performed at [facility], [address] [place], [place] [address]   Novel Coronavirus, NAA [facility] order, Send-out to Ref Lab; TAT 18-24 hrs     Status: None   Collection Time: 04/07/19  5:13 AM   Specimen: Nasopharyngeal Swab; Respiratory  Result Value Ref Range Status   SARS-CoV-2, NAA NOT DETECTED NOT DETECTED Final    Comment: (NOTE) This nucleic acid amplification test was developed and its performance characteristics determined by [employer], [employer] and Company. Nucleic acid amplification tests include PCR and TMA. This test  has not been FDA cleared or approved. This test has been authorized by FDA under an Emergency Use Authorization (EUA). This test is only authorized for the duration of time the declaration that circumstances exist justifying the authorization of the emergency use of in vitro diagnostic tests for detection of SARS-CoV-2 virus and/or diagnosis of COVID-19 infection under section  564(b)(1) of the Act, 21 U.S.C. 013HYH-8(O) (1), unless the authorization is terminated or revoked sooner. When diagnostic testing is negative, the possibility of a false negative result should be considered in the context of a patient's recent exposures and the presence of clinical signs and symptoms consistent with COVID-19. An individual without symptoms of COVID- 19 and who is not shedding SARS-CoV-2 vi rus would expect to have a negative (not detected) result in this assay. Performed At: [facility] [address] [place], [place] [id number] Rush Farmer MD SU:[id number]    [place]  Corrected    Comment: Performed at [facility], [address] [place], [place] [address] CORRECTED ON 01/05 AT 4709: PREVIOUSLY REPORTED AS NASOPHARYNGEAL     Please note: You were cared for by a hospitalist during your hospital stay. Once you are discharged, your primary care physician will handle any further medical issues. Please note that NO REFILLS for any discharge medications will be authorized once you are discharged, as it is imperative that you return to your primary care physician (or establish a relationship with a primary care physician if you do not have one) for your post hospital discharge needs so that they can reassess your need for medications and monitor your lab values.    Time coordinating discharge: 40 minutes  SIGNED:   Shelly Coss, MD  [facility] 04/08/2019, 10:57 AM Pager [phone]  If 7PM-7AM, please contact  night-coverage [email or web address] Password [email or web address]

## 2019-04-09 LAB — TYPE AND SCREEN
ABO/RH(D): O POS
Antibody Screen: NEGATIVE
Unit division: 0
Unit division: 0

## 2019-04-09 LAB — BPAM RBC
Blood Product Expiration Date: 202102032359
Blood Product Expiration Date: 202102032359
ISSUE DATE / TIME: 202101052113
ISSUE DATE / TIME: 202101060204
Unit Type and Rh: 5100
Unit Type and Rh: 5100

## 2019-04-14 ENCOUNTER — Other Ambulatory Visit: Payer: Self-pay

## 2019-04-15 ENCOUNTER — Ambulatory Visit (INDEPENDENT_AMBULATORY_CARE_PROVIDER_SITE_OTHER): Payer: BC Managed Care – PPO | Admitting: Family

## 2019-04-15 ENCOUNTER — Encounter: Payer: Self-pay | Admitting: Family

## 2019-04-15 VITALS — BP 126/77 | HR 88 | Temp 97.3°F | Resp 16 | Ht 66.0 in | Wt 365.0 lb

## 2019-04-15 DIAGNOSIS — N920 Excessive and frequent menstruation with regular cycle: Secondary | ICD-10-CM | POA: Diagnosis not present

## 2019-04-15 DIAGNOSIS — R0789 Other chest pain: Secondary | ICD-10-CM | POA: Diagnosis not present

## 2019-04-15 DIAGNOSIS — D649 Anemia, unspecified: Secondary | ICD-10-CM

## 2019-04-15 LAB — POCT HEMOGLOBIN: Hemoglobin: 9.1 g/dL — AB (ref 11–14.6)

## 2019-04-15 NOTE — Progress Notes (Signed)
Subjective:    Patient ID: Margaret Golden, female    DOB: 04-16-1990, 29 y.o.   MRN: [id number]  HPI  Patient is a 29 yr old female who presents today for hospital follow up.  Hospital discharge summary is reviewed.  She initially presented to the emergency C department on April 07, 2019.  She described chest pain, lightheadedness, palpitations and headache at that time.  She also had some shortness of breath with exertion.  She has a history of heavy menstrual cycles.  Her last menstrual cycle was about 2 weeks prior to her admission.  Her hemoglobin was found to be 6.6 in the emergency department and she was admitted for blood transfusion.  She received 2 units of packed red blood cells.  She was also given an IV iron infusion during her admission as her iron level was low.  She was discharged home on oral iron supplementation.  She reports that she had light the first day, heavier the second, medium flow yesterday and completely stopped today.  Prior to that she had 2 heavy menstrual cycles that are >1 week long.  She reports that she still has some fatigue and since yesterday the "chest pains have come back. " notes mild chest pain currently.  Started yesterday. Same symptoms as before.  Notes some + sob.   Review of Systems    see HPI  Past Medical History:  Diagnosis Date  . ADD (attention deficit disorder)   . Anxiety   . Depression   . Fatty liver   . History of chicken pox   . History of kidney stones   . RLS (restless legs syndrome)   . Thyroid disease    hypothyroid     Social History   Socioeconomic History  . Marital status: Single    Spouse name: Not on file  . Number of children: Not on file  . Years of education: 63  . Highest education level: Not on file  Occupational History  . Not on file  Tobacco Use  . Smoking status: Never Smoker  . Smokeless tobacco: Never Used  Substance and Sexual Activity  . Alcohol use: Never    Alcohol/week: 0.0 standard  drinks  . Drug use: No  . Sexual activity: Never  Other Topics Concern  . Not on file  Social History Narrative   Unemployed   Lives with Dad   Seeking work   Only child   Dog 3   Lives in a one story house with father, has a cat-12/26/16-sjb   Social Determinants of Radio broadcast assistant Strain:   . Difficulty of Paying Living Expenses: Not on file  Food Insecurity:   . Worried About Charity fundraiser in the Last Year: Not on file  . Ran Out of Food in the Last Year: Not on file  Transportation Needs:   . Lack of Transportation (Medical): Not on file  . Lack of Transportation (Non-Medical): Not on file  Physical Activity:   . Days of Exercise per Week: Not on file  . Minutes of Exercise per Session: Not on file  Stress:   . Feeling of Stress : Not on file  Social Connections:   . Frequency of Communication with Friends and Family: Not on file  . Frequency of Social Gatherings with Friends and Family: Not on file  . Attends Religious Services: Not on file  . Active Member of Clubs or Organizations: Not on file  . Attends  Club or Organization Meetings: Not on file  . Marital Status: Not on file  Intimate Partner Violence:   . Fear of Current or Ex-Partner: Not on file  . Emotionally Abused: Not on file  . Physically Abused: Not on file  . Sexually Abused: Not on file    Past Surgical History:  Procedure Laterality Date  . KNEE ARTHROSCOPY Right 2007  . TONSILLECTOMY  2010  . WISDOM TOOTH EXTRACTION      Family History  Problem Relation Age of Onset  . Depression Mother   . Depression Father   . Cancer Paternal Aunt        colon  . Cancer Paternal Uncle        "small cell cancer"  . Diabetes Maternal Grandfather   . Arthritis Maternal Grandfather   . Arthritis Maternal Grandmother   . Breast cancer Maternal Grandmother   . Arthritis Paternal Grandmother   . Arthritis Paternal Grandfather     Allergies  Allergen Reactions  . Augmentin  [Amoxicillin-Pot Clavulanate]     hives    Current Outpatient Medications on File Prior to Visit  Medication Sig Dispense Refill  . albuterol (VENTOLIN HFA) 108 (90 Base) MCG/ACT inhaler Inhale 2 puffs into the lungs every 6 (six) hours as needed for wheezing or shortness of breath (Chest tightness). 18 g 0  . aspirin-acetaminophen-caffeine (EXCEDRIN MIGRAINE) 599-774-14 MG tablet Take by mouth every 6 (six) hours as needed for headache.    . ferrous sulfate 325 (65 FE) MG tablet Take 1 tablet (325 mg total) by mouth 2 (two) times daily with a meal. 60 tablet 1  . gabapentin (NEURONTIN) 600 MG tablet Take 1 tablet (600 mg total) by mouth at bedtime. 90 tablet 0  . lamoTRIgine (LAMICTAL) 200 MG tablet Take 1 tablet (200 mg total) by mouth daily. 90 tablet 0  . levothyroxine (SYNTHROID) 175 MCG tablet Take 1 tablet (175 mcg total) by mouth daily before breakfast. 30 tablet 5  . MOBIC 15 MG tablet Take 15 mg by mouth daily as needed for muscle pain.    . Multiple Vitamins-Minerals (MULTIVITAMIN WITH MINERALS) tablet Take 1 tablet by mouth daily.    Marland Kitchen omeprazole (PRILOSEC) 20 MG capsule Take 2 capsules (40 mg total) by mouth daily. 180 capsule 2  . sertraline (ZOLOFT) 100 MG tablet Take 2 tablets (200 mg total) by mouth daily. 180 tablet 0  . SUMAtriptan (IMITREX) 100 MG tablet Take 1 tablet (100 mg total) by mouth every 2 (two) hours as needed for migraine. May repeat in 2 hours if headache persists or recurs. (Patient not taking: Reported on 04/15/2019) 10 tablet 0   No current facility-administered medications on file prior to visit.    BP 126/77 (BP Location: Right Arm, Patient Position: Sitting, Cuff Size: Large)   Pulse 88   Temp (!) 97.3 F (36.3 C) (Temporal)   Resp 16   Ht '5\' 6"'  (1.676 m)   Wt (!) 365 lb (165.6 kg)   LMP 03/23/2019   SpO2 100%   BMI 58.91 kg/m    Objective:   Physical Exam Constitutional:      Appearance: She is well-developed. She is obese.  Neck:      Thyroid: No thyromegaly.  Cardiovascular:     Rate and Rhythm: Normal rate and regular rhythm.     Heart sounds: Normal heart sounds. No murmur.  Pulmonary:     Effort: Pulmonary effort is normal. No respiratory distress.     Breath  sounds: Normal breath sounds. No wheezing.  Musculoskeletal:     Cervical back: Neck supple.  Skin:    General: Skin is warm and dry.  Neurological:     Mental Status: She is alert and oriented to person, place, and time.  Psychiatric:        Behavior: Behavior normal.        Thought Content: Thought content normal.        Judgment: Judgment normal.           Assessment & Plan:  Anemia- pt declined rectal exam today.  Agreeable to home IFOB collection and was given kit/instructions. hgb 9.1 today. She has only been taking iron once daily. Advised her to increase to bid and add a stool softener as needed.   Menorrhagia- refer to GYN for management of her heavy bleeding.  Atypical chest pain- reports that this is exactly like the pain she was experiencing while in the ED on 1/5. She had neg troponin and neg d dimer at that time. EKG tracing is personally reviewed.  EKG notes NSR.  No acute changes. ? Anxiety. Will refer to cardiology for further evaluation. Pt is advised to return to the ED if chest pain worsens. Pt verbalizes understanding.  This visit occurred during the SARS-CoV-2 public health emergency.  Safety protocols were in place, including screening questions prior to the visit, additional usage of staff PPE, and extensive cleaning of exam room while observing appropriate contact time as indicated for disinfecting solutions.

## 2019-04-15 NOTE — Patient Instructions (Signed)
Please complete stool kit and return at your earliest convenience. Go to the ER if you develop worsening chest pain or shortness of breath. You should be contacted about your referral to GYN.

## 2019-04-16 DIAGNOSIS — F4323 Adjustment disorder with mixed anxiety and depressed mood: Secondary | ICD-10-CM | POA: Diagnosis not present

## 2019-04-23 DIAGNOSIS — F4323 Adjustment disorder with mixed anxiety and depressed mood: Secondary | ICD-10-CM | POA: Diagnosis not present

## 2019-04-27 ENCOUNTER — Telehealth: Payer: Self-pay | Admitting: Family

## 2019-04-27 NOTE — Telephone Encounter (Signed)
GYN has not been able to get in touch with her to schedule an appointment. It is very important that she see them. Please give her the number for GYN in our building to schedule her appointment.

## 2019-04-27 NOTE — Telephone Encounter (Signed)
Left voice mail and MyChart detail message sent. GYN phone number information on MyChart message, patient advised to call them back asap at [phone].

## 2019-04-28 DIAGNOSIS — F4323 Adjustment disorder with mixed anxiety and depressed mood: Secondary | ICD-10-CM | POA: Diagnosis not present

## 2019-04-29 DIAGNOSIS — S93402D Sprain of unspecified ligament of left ankle, subsequent encounter: Secondary | ICD-10-CM | POA: Diagnosis not present

## 2019-04-29 NOTE — Telephone Encounter (Signed)
Patient advised GYN has been trying to reach her. Phone number given to patient for her to call them ans set up appointment

## 2019-05-11 ENCOUNTER — Encounter: Payer: Self-pay | Admitting: Psychiatry

## 2019-05-11 ENCOUNTER — Ambulatory Visit (INDEPENDENT_AMBULATORY_CARE_PROVIDER_SITE_OTHER): Payer: BC Managed Care – PPO | Admitting: Psychiatry

## 2019-05-11 DIAGNOSIS — F4010 Social phobia, unspecified: Secondary | ICD-10-CM | POA: Diagnosis not present

## 2019-05-11 DIAGNOSIS — G2581 Restless legs syndrome: Secondary | ICD-10-CM | POA: Diagnosis not present

## 2019-05-11 DIAGNOSIS — F330 Major depressive disorder, recurrent, mild: Secondary | ICD-10-CM

## 2019-05-11 DIAGNOSIS — G4700 Insomnia, unspecified: Secondary | ICD-10-CM | POA: Diagnosis not present

## 2019-05-11 MED ORDER — SERTRALINE HCL 100 MG PO TABS: 200.0000 mg | ORAL_TABLET | Freq: Every day | ORAL | 3 refills | Status: DC

## 2019-05-11 MED ORDER — GABAPENTIN 600 MG PO TABS: 600.0000 mg | ORAL_TABLET | Freq: Every day | ORAL | 4 refills | Status: DC

## 2019-05-11 MED ORDER — LAMOTRIGINE 200 MG PO TABS: 200.0000 mg | ORAL_TABLET | Freq: Every day | ORAL | 3 refills | Status: DC

## 2019-05-11 NOTE — Progress Notes (Signed)
Margaret Golden [id number] 20-Oct-1990 29 y.o.  Virtual Visit via Telephone Note  I connected with pt on 05/11/19 at  2:00 PM EST by telephone and verified that I am speaking with the correct person using two identifiers.   I discussed the limitations, risks, security and privacy concerns of performing an evaluation and management service by telephone and the availability of in person appointments. I also discussed with the patient that there may be a patient responsible charge related to this service. The patient expressed understanding and agreed to proceed.   I discussed the assessment and treatment plan with the patient. The patient was provided an opportunity to ask questions and all were answered. The patient agreed with the plan and demonstrated an understanding of the instructions.   The patient was advised to call back or seek an in-person evaluation if the symptoms worsen or if the condition fails to improve as anticipated.  I provided 25 minutes of non-face-to-face time during this encounter.  The patient was located at home.  The provider was located at [place].   Thayer Headings, PMHNP   Subjective:   Patient ID:  Margaret Golden is a 29 y.o. (DOB 09-Mar-1979) female.  Chief Complaint:  Chief Complaint  Patient presents with  . Follow-up    Anxiety, Depression, and h/o RLS    HPI Margaret Golden presents for follow-up of depression, anxiety, and insomnia. She reports "a little situational depression." She reports that depression comes and goes and she is typically able to pull herself out of depression. Reports that her anxiety has been manageable recently. Denies significant irritability. "Sleep has always been a struggle," especially with inconsistent schedule. She reports that she primarily has difficulty with sleep initiation and then is able to stay asleep. She reports that RLS has been better controlled with Gabapentin with occ mild RLS when anxiety is  elevated. Reports RLS has been manageable. Denies RLS during the day. Appetite has fluctuated. Energy and motivation have been lower. Has been able to do what she needs to do after significant encouragement and reminders. Concentration is ok overall. Occ difficulty sustaining focus for longer periods of time. Denies SI.   Has continued to try to get back into work. She has a new kitten. Father will be moving out in the next 10 days and anticipates this may trigger some depression.   Past medication trials: Sertraline-helpful for depression and anxiety Wellbutrin Lexapro-ineffective Cymbalta Pristiq Seroquel- does not recall any significant improvement Latuda Abilify Propanolol Melatonin Vyvanse Adderall XR- not effective Concerta- Notices duration only lasts about 4 hours. No significant improvement with doses less than 54 mg. Horizant Lamictal-effective for mood Trazodone-effective for insomnia but causes some excessive drowsiness Rexulti- May have caused n/v and poor concentration Adhansia- May have caused n/v and poor concentration   Review of Systems:  Review of Systems  Cardiovascular: Negative for palpitations.  Genitourinary: Positive for menstrual problem.  Musculoskeletal: Negative for gait problem.  Allergic/Immunologic: Positive for environmental allergies.  Neurological: Negative for tremors.  Hematological:       Recent ER visit for symptomatic anemia due to heavy menstrual bleeding.   Psychiatric/Behavioral:       Please refer to HPI    Medications: I have reviewed the patient's current medications.  Current Outpatient Medications  Medication Sig Dispense Refill  . albuterol (VENTOLIN HFA) 108 (90 Base) MCG/ACT inhaler Inhale 2 puffs into the lungs every 6 (six) hours as needed for wheezing or shortness of breath (Chest tightness).  18 g 0  . aspirin-acetaminophen-caffeine (EXCEDRIN MIGRAINE) 250-250-65 MG tablet Take by mouth every 6 (six) hours as needed  for headache.    . ferrous sulfate 325 (65 FE) MG tablet Take 1 tablet (325 mg total) by mouth 2 (two) times daily with a meal. 60 tablet 1  . gabapentin (NEURONTIN) 600 MG tablet Take 1 tablet (600 mg total) by mouth at bedtime. 30 tablet 4  . lamoTRIgine (LAMICTAL) 200 MG tablet Take 1 tablet (200 mg total) by mouth daily. 30 tablet 3  . levothyroxine (SYNTHROID) 175 MCG tablet Take 1 tablet (175 mcg total) by mouth daily before breakfast. 30 tablet 5  . Multiple Vitamins-Minerals (MULTIVITAMIN WITH MINERALS) tablet Take 1 tablet by mouth daily.    Marland Kitchen omeprazole (PRILOSEC) 20 MG capsule Take 2 capsules (40 mg total) by mouth daily. 180 capsule 2  . sertraline (ZOLOFT) 100 MG tablet Take 2 tablets (200 mg total) by mouth daily. 60 tablet 3  . MOBIC 15 MG tablet Take 15 mg by mouth daily as needed for muscle pain.    . SUMAtriptan (IMITREX) 100 MG tablet Take 1 tablet (100 mg total) by mouth every 2 (two) hours as needed for migraine. May repeat in 2 hours if headache persists or recurs. (Patient not taking: Reported on 04/15/2019) 10 tablet 0   No current facility-administered medications for this visit.    Medication Side Effects: None  Allergies:  Allergies  Allergen Reactions  . Augmentin [Amoxicillin-Pot Clavulanate]     hives    Past Medical History:  Diagnosis Date  . ADD (attention deficit disorder)   . Anxiety   . Depression   . Fatty liver   . History of chicken pox   . History of kidney stones   . RLS (restless legs syndrome)   . Thyroid disease    hypothyroid    Family History  Problem Relation Age of Onset  . Depression Mother   . Depression Father   . Cancer Paternal Aunt        colon  . Cancer Paternal Uncle        "small cell cancer"  . Diabetes Maternal Grandfather   . Arthritis Maternal Grandfather   . Arthritis Maternal Grandmother   . Breast cancer Maternal Grandmother   . Arthritis Paternal Grandmother   . Arthritis Paternal Grandfather      Social History   Socioeconomic History  . Marital status: Single    Spouse name: Not on file  . Number of children: Not on file  . Years of education: 34  . Highest education level: Not on file  Occupational History  . Not on file  Tobacco Use  . Smoking status: Never Smoker  . Smokeless tobacco: Never Used  Substance and Sexual Activity  . Alcohol use: Never    Alcohol/week: 0.0 standard drinks  . Drug use: No  . Sexual activity: Never  Other Topics Concern  . Not on file  Social History Narrative   Unemployed   Lives with Dad   Seeking work   Only child   Dog 3   Lives in a one story house with father, has a cat-12/26/16-sjb   Social Determinants of Corporate investment banker Strain:   . Difficulty of Paying Living Expenses: Not on file  Food Insecurity:   . Worried About Programme researcher, broadcasting/film/video in the Last Year: Not on file  . Ran Out of Food in the Last Year: Not on file  Transportation Needs:   . Freight forwarder (Medical): Not on file  . Lack of Transportation (Non-Medical): Not on file  Physical Activity:   . Days of Exercise per Week: Not on file  . Minutes of Exercise per Session: Not on file  Stress:   . Feeling of Stress : Not on file  Social Connections:   . Frequency of Communication with Friends and Family: Not on file  . Frequency of Social Gatherings with Friends and Family: Not on file  . Attends Religious Services: Not on file  . Active Member of Clubs or Organizations: Not on file  . Attends Banker Meetings: Not on file  . Marital Status: Not on file  Intimate Partner Violence:   . Fear of Current or Ex-Partner: Not on file  . Emotionally Abused: Not on file  . Physically Abused: Not on file  . Sexually Abused: Not on file    Past Medical History, Surgical history, Social history, and Family history were reviewed and updated as appropriate.   Please see review of systems for further details on the patient's review  from today.   Objective:   Physical Exam:  There were no vitals taken for this visit.  Physical Exam Neurological:     Mental Status: She is alert and oriented to person, place, and time.     Cranial Nerves: No dysarthria.  Psychiatric:        Attention and Perception: Attention and perception normal.        Mood and Affect: Mood is depressed.        Speech: Speech normal.        Behavior: Behavior is cooperative.        Thought Content: Thought content normal. Thought content is not paranoid or delusional. Thought content does not include homicidal or suicidal ideation. Thought content does not include homicidal or suicidal plan.        Cognition and Memory: Cognition and memory normal.        Judgment: Judgment normal.     Comments: Insight intact Mood presents as mildly depressed.     Lab Review:     Component Value Date/Time   NA 140 04/07/2019 0513   K 3.7 04/07/2019 0513   CL 107 04/07/2019 0513   CO2 25 04/07/2019 0513   GLUCOSE 104 (H) 04/07/2019 0513   BUN 11 04/07/2019 0513   CREATININE 0.74 04/07/2019 0513   CREATININE 0.75 07/06/2016 1534   CALCIUM 7.5 (L) 04/07/2019 0513   PROT 8.1 07/26/2017 1014   ALBUMIN 4.5 07/26/2017 1014   AST 33 07/26/2017 1014   ALT 21 07/26/2017 1014   ALKPHOS 82 07/26/2017 1014   BILITOT 0.5 07/26/2017 1014   GFRNONAA >60 04/07/2019 0513   GFRAA >60 04/07/2019 0513       Component Value Date/Time   WBC 11.7 (H) 04/08/2019 0741   RBC 3.69 (L) 04/08/2019 0741   HGB 9.1 (A) 04/15/2019 1326   HGB 8.4 (L) 04/08/2019 0741   HCT 28.7 (L) 04/08/2019 0741   PLT 190 04/08/2019 0741   MCV 77.8 (L) 04/08/2019 0741   MCH 22.8 (L) 04/08/2019 0741   MCHC 29.3 (L) 04/08/2019 0741   RDW 15.3 04/08/2019 0741   LYMPHSABS 3.9 04/07/2019 0513   MONOABS 0.8 04/07/2019 0513   EOSABS 0.1 04/07/2019 0513   BASOSABS 0.0 04/07/2019 0513    No results found for: POCLITH, LITHIUM   No results found for: PHENYTOIN, PHENOBARB, VALPROATE, CBMZ    .  res Assessment: Plan:   Pt reports that she would prefer to continue current medications without changes since overall mood and anxiety s/s are stable with some mild situational depression. Will continue current plan of care. Recommend continuing psychotherapy with Tina Griffiths, LPC. Pt to f/u with this provider in 3 months or sooner if clinically indicated.  Patient advised to contact office with any questions, adverse effects, or acute worsening in signs and symptoms.  Hazelyn was seen today for follow-up.  Diagnoses and all orders for this visit:  RLS (restless legs syndrome) -     gabapentin (NEURONTIN) 600 MG tablet; Take 1 tablet (600 mg total) by mouth at bedtime.  Insomnia, unspecified type -     gabapentin (NEURONTIN) 600 MG tablet; Take 1 tablet (600 mg total) by mouth at bedtime.  Major depressive disorder, recurrent episode, mild (HCC) -     lamoTRIgine (LAMICTAL) 200 MG tablet; Take 1 tablet (200 mg total) by mouth daily. -     sertraline (ZOLOFT) 100 MG tablet; Take 2 tablets (200 mg total) by mouth daily.  Social phobia -     sertraline (ZOLOFT) 100 MG tablet; Take 2 tablets (200 mg total) by mouth daily.    Please see After Visit Summary for patient specific instructions.  Future Appointments  Date Time Provider Department Center  05/19/2019  1:00 PM Verner Chol, CNM [facility] None    No orders of the defined types were placed in this encounter.     -------------------------------

## 2019-05-17 ENCOUNTER — Other Ambulatory Visit: Payer: Self-pay | Admitting: Family

## 2019-05-18 ENCOUNTER — Other Ambulatory Visit: Payer: Self-pay

## 2019-05-19 ENCOUNTER — Ambulatory Visit (INDEPENDENT_AMBULATORY_CARE_PROVIDER_SITE_OTHER): Payer: BC Managed Care – PPO | Admitting: Certified Nurse Midwife

## 2019-05-19 ENCOUNTER — Other Ambulatory Visit (HOSPITAL_COMMUNITY)
Admission: RE | Admit: 2019-05-19 | Discharge: 2019-05-19 | Disposition: A | Discharge status: Home / Self Care | Payer: BC Managed Care – PPO | Source: Ambulatory Visit | Attending: Certified Nurse Midwife | Admitting: Certified Nurse Midwife

## 2019-05-19 ENCOUNTER — Other Ambulatory Visit: Payer: Self-pay

## 2019-05-19 ENCOUNTER — Encounter: Payer: Self-pay | Admitting: Certified Nurse Midwife

## 2019-05-19 VITALS — BP 122/82 | HR 70 | Temp 97.3°F | Resp 16 | Ht 67.0 in | Wt 368.0 lb

## 2019-05-19 DIAGNOSIS — Z124 Encounter for screening for malignant neoplasm of cervix: Secondary | ICD-10-CM | POA: Diagnosis not present

## 2019-05-19 DIAGNOSIS — N920 Excessive and frequent menstruation with regular cycle: Secondary | ICD-10-CM

## 2019-05-19 DIAGNOSIS — Z862 Personal history of diseases of the blood and blood-forming organs and certain disorders involving the immune mechanism: Secondary | ICD-10-CM

## 2019-05-19 DIAGNOSIS — N946 Dysmenorrhea, unspecified: Secondary | ICD-10-CM

## 2019-05-19 DIAGNOSIS — Z01419 Encounter for gynecological examination (general) (routine) without abnormal findings: Secondary | ICD-10-CM | POA: Diagnosis not present

## 2019-05-19 NOTE — Progress Notes (Signed)
29 y.o. G0P0000 Single  Caucasian Fe here to establish gyn care and  for annual exam. Also here for cycle control for " heavy periods" with duration of 4-5 days, sometimes 10 days. 3-4 pads used in 24 hour period with the 4-5 day cycles with cramping. Uses OTC Advil medication for relief. Currently on end of period now..Patient also has history of anemia with periods. Recent transfusion for anemia in 04/07/19. Patient takes iron daily, tries to eat healthy. Father lives with her at present. Not sexually active ever. Psychiatry management of Zoloft and  Lamictal for depression.  PCP manages Hypothyroid and hypertension and other medications, aex and labs. Family history of breast cancer with MGM with late age onset. Patient desires to feel normal with periods if possible. No other health issues today.  Patient's last menstrual period was 05/14/2019 (exact date).          Sexually active: No.never sexually active  The current method of family planning is abstinence.    Exercising: No.  exercise Smoker:  no  Review of Systems  Constitutional:       Bad menstrual cramps, heavy cycles  HENT: Negative.   Eyes: Negative.   Respiratory: Negative.   Cardiovascular: Negative.   Gastrointestinal: Negative.   Genitourinary: Negative.   Musculoskeletal: Negative.   Skin: Negative.   Neurological: Negative.   Endo/Heme/Allergies: Negative.   Psychiatric/Behavioral: Negative.     Health Maintenance: Pap:  Long time ago History of Abnormal Pap: no MMG:  none Self Breast exams: no Colonoscopy:  none BMD:   none TDaP:  2016 Shingles: no Pneumonia: no Hep C and HIV: HIV neg 2021 Labs: if needed   reports that she has never smoked. She has never used smokeless tobacco. She reports previous alcohol use. She reports that she does not use drugs.  Past Medical History:  Diagnosis Date  . ADD (attention deficit disorder)   . Anxiety   . Depression   . Exercise-induced asthma   . Fatty liver   .  History of chicken pox   . History of kidney stones   . Migraines   . RLS (restless legs syndrome)   . Thyroid disease    hypothyroid    Past Surgical History:  Procedure Laterality Date  . KNEE ARTHROSCOPY Right 2007  . TONSILLECTOMY  2010  . WISDOM TOOTH EXTRACTION      Current Outpatient Medications  Medication Sig Dispense Refill  . albuterol (VENTOLIN HFA) 108 (90 Base) MCG/ACT inhaler Inhale 2 puffs into the lungs every 6 (six) hours as needed for wheezing or shortness of breath (Chest tightness). 18 g 0  . aspirin-acetaminophen-caffeine (EXCEDRIN MIGRAINE) 250-250-65 MG tablet Take by mouth every 6 (six) hours as needed for headache.    . ferrous sulfate 325 (65 FE) MG tablet Take 1 tablet (325 mg total) by mouth 2 (two) times daily with a meal. 60 tablet 1  . gabapentin (NEURONTIN) 600 MG tablet Take 1 tablet (600 mg total) by mouth at bedtime. 30 tablet 4  . lamoTRIgine (LAMICTAL) 200 MG tablet Take 1 tablet (200 mg total) by mouth daily. 30 tablet 3  . levothyroxine (SYNTHROID) 175 MCG tablet TAKE ONE TABLET BY MOUTH EVERY MORNING BEFORE BREAKFAST 30 tablet 4  . MOBIC 15 MG tablet Take 15 mg by mouth daily as needed for muscle pain.    . Multiple Vitamins-Minerals (MULTIVITAMIN WITH MINERALS) tablet Take 1 tablet by mouth daily.    Marland Kitchen omeprazole (PRILOSEC) 20 MG capsule Take  2 capsules (40 mg total) by mouth daily. 180 capsule 2  . sertraline (ZOLOFT) 100 MG tablet Take 2 tablets (200 mg total) by mouth daily. 60 tablet 3  . VITAMIN D PO Take by mouth.     No current facility-administered medications for this visit.    Family History  Problem Relation Age of Onset  . Depression Mother   . Depression Father   . Cancer Paternal Aunt        colon  . Cancer Paternal Uncle        brain cancer  . Diabetes Maternal Grandfather   . Arthritis Maternal Grandfather   . Arthritis Maternal Grandmother   . Breast cancer Maternal Grandmother   . Arthritis Paternal Grandmother    . Arthritis Paternal Grandfather     ROS:  Pertinent items are noted in HPI.  Otherwise, a comprehensive ROS was negative.  Exam:   BP 122/82   Pulse 70   Temp (!) 97.3 F (36.3 C) (Skin)   Resp 16   Ht 5\' 7"  (1.702 m)   Wt (!) 368 lb (166.9 kg)   LMP 05/14/2019 (Exact Date)   BMI 57.64 kg/m  Height: 5\' 7"  (170.2 cm) Ht Readings from Last 3 Encounters:  05/19/19 5\' 7"  (1.702 m)  04/15/19 5\' 6"  (1.676 m)  04/06/19 5\' 7"  (1.702 m)    General appearance: alert, cooperative and appears stated age, morbid obesity Head: Normocephalic, without obvious abnormality, atraumatic Neck: no adenopathy, supple, symmetrical, trachea midline and thyroid mild enlargement noted Lungs: clear to auscultation bilaterally Breasts: normal appearance, no masses or tenderness, No nipple retraction or dimpling, No nipple discharge or bleeding, No axillary or supraclavicular adenopathy, Taught monthly breast self examination Heart: regular rate and rhythm Abdomen: soft, non-tender; no masses,  no organomegaly Extremities: extremities normal, atraumatic, no cyanosis or edema Skin: Skin color, texture, turgor normal. No rashes or lesions Lymph nodes: Cervical, supraclavicular, and axillary nodes normal. No abnormal inguinal nodes palpated Neurologic: Grossly normal   Pelvic: External genitalia:  no lesions              Urethra:  normal appearing urethra with no masses, tenderness or lesions              Bartholin's and Skene's: normal                 Vagina: normal appearing vagina with normal color and discharge, no lesions              Cervix: no cervical motion tenderness, no lesions and nulliparous appearance              Pap taken: Yes.   Bimanual Exam:  Uterus:  anteverted and feels normal size              Adnexa: no mass, fullness, tenderness and difficult to feel adnexa bilateral due body habitus, no large masses noted               Rectovaginal: Confirms               Anus:  normal  sphincter tone, no lesions  Chaperone present: yes  A:  Well Woman with normal exam  Menorrhagia with Dysmenorrhea request cycle control  History of anemia with recent blood transfusion  Morbid obesity  Hypertension/hypothyroidism/fatty liver with PCP management  Migraine history no aura  Depression with Psychiatry management  P:   Reviewed health and wellness pertinent to exam  Discussed options for cycle  control with OCP, IUD and Nexplanon. Risks/benefits/insertion and removal discussed.She has used OCP before and did not work well. Given printed information to review and consider IUD, which may work the best. Questions addressed at length. Patient will advise.  Discussed importance of monitoring bleeding and eating iron enriched foods in addition to oral iron supplement.  Labs: CBC, Ferritin, TIBC  Encouraged to work on weight which increases her risk of heavy periods.  Continue follow up with MD as indicated.  Pap smear: yes  counseled on breast self exam, STD prevention, HIV risk factors and prevention, feminine hygiene, family planning choices, adequate intake of calcium and vitamin D, diet and exercise return annually or prn  An After Visit Summary was printed and given to the patient.

## 2019-05-19 NOTE — Patient Instructions (Signed)
General topics  Next pap or exam is  due in 1 year Take a Women's multivitamin Take 1200 mg. of calcium daily - prefer dietary If any concerns in interim to call back  Breast Self-Awareness Practicing breast self-awareness may pick up problems early, prevent significant medical complications, and possibly save your life. By practicing breast self-awareness, you can become familiar with how your breasts look and feel and if your breasts are changing. This allows you to notice changes early. It can also offer you some reassurance that your breast health is good. One way to learn what is normal for your breasts and whether your breasts are changing is to do a breast self-exam. If you find a lump or something that was not present in the past, it is best to contact your caregiver right away. Other findings that should be evaluated by your caregiver include nipple discharge, especially if it is bloody; skin changes or reddening; areas where the skin seems to be pulled in (retracted); or new lumps and bumps. Breast pain is seldom associated with cancer (malignancy), but should also be evaluated by a caregiver. BREAST SELF-EXAM The best time to examine your breasts is 5 7 days after your menstrual period is over.  ExitCare Patient Information 2013 ExitCare[employer].   Exercise to Stay Healthy Exercise helps you become and stay healthy. EXERCISE IDEAS AND TIPS Choose exercises that:  You enjoy.  Fit into your day. You do not need to exercise really hard to be healthy. You can do exercises at a slow or medium level and stay healthy. You can:  Stretch before and after working out.  Try yoga, Pilates, or tai chi.  Lift weights.  Walk fast, swim, jog, run, climb stairs, bicycle, dance, or rollerskate.  Take aerobic classes. Exercises that burn about 150 calories:  Running 1  miles in 15 minutes.  Playing volleyball for 45 to 60 minutes.  Washing and waxing a car for 45 to 60  minutes.  Playing touch football for 45 minutes.  Walking 1  miles in 35 minutes.  Pushing a stroller 1  miles in 30 minutes.  Playing basketball for 30 minutes.  Raking leaves for 30 minutes.  Bicycling 5 miles in 30 minutes.  Walking 2 miles in 30 minutes.  Dancing for 30 minutes.  Shoveling snow for 15 minutes.  Swimming laps for 20 minutes.  Walking up stairs for 15 minutes.  Bicycling 4 miles in 15 minutes.  Gardening for 30 to 45 minutes.  Jumping rope for 15 minutes.  Washing windows or floors for 45 to 60 minutes. Document Released: 04/21/2010 Document Revised: 06/11/2011 Document Reviewed: 04/21/2010 ExitCare Patient Information 2013 ExitCare[employer].   Other topics ( that may be useful information):    Sexually Transmitted Disease Sexually transmitted disease (STD) refers to any infection that is passed from person to person during sexual activity. This may happen by way of saliva, semen, blood, vaginal mucus, or urine. Common STDs include:  Gonorrhea.  Chlamydia.  Syphilis.  HIV/AIDS.  Genital herpes.  Hepatitis B and C.  Trichomonas.  Human papillomavirus (HPV).  Pubic lice. CAUSES  An STD may be spread by bacteria, virus, or parasite. A person can get an STD by:  Sexual intercourse with an infected person.  Sharing sex toys with an infected person.  Sharing needles with an infected person.  Having intimate contact with the genitals, mouth, or rectal areas of an infected person. SYMPTOMS  Some people may not have any symptoms, but   they can still pass the infection to others. Different STDs have different symptoms. Symptoms include:  Painful or bloody urination.  Pain in the pelvis, abdomen, vagina, anus, throat, or eyes.  Skin rash, itching, irritation, growths, or sores (lesions). These usually occur in the genital or anal area.  Abnormal vaginal discharge.  Penile discharge in men.  Soft, flesh-colored skin growths in the  genital or anal area.  Fever.  Pain or bleeding during sexual intercourse.  Swollen glands in the groin area.  Yellow skin and eyes (jaundice). This is seen with hepatitis. DIAGNOSIS  To make a diagnosis, your caregiver may:  Take a medical history.  Perform a physical exam.  Take a specimen (culture) to be examined.  Examine a sample of discharge under a microscope.  Perform blood test TREATMENT   Chlamydia, gonorrhea, trichomonas, and syphilis can be cured with antibiotic medicine.  Genital herpes, hepatitis, and HIV can be treated, but not cured, with prescribed medicines. The medicines will lessen the symptoms.  Genital warts from HPV can be treated with medicine or by freezing, burning (electrocautery), or surgery. Warts may come back.  HPV is a virus and cannot be cured with medicine or surgery.However, abnormal areas may be followed very closely by your caregiver and may be removed from the cervix, vagina, or vulva through office procedures or surgery. If your diagnosis is confirmed, your recent sexual partners need treatment. This is true even if they are symptom-free or have a negative culture or evaluation. They should not have sex until their caregiver says it is okay. HOME CARE INSTRUCTIONS  All sexual partners should be informed, tested, and treated for all STDs.  Take your antibiotics as directed. Finish them even if you start to feel better.  Only take over-the-counter or prescription medicines for pain, discomfort, or fever as directed by your caregiver.  Rest.  Eat a balanced diet and drink enough fluids to keep your urine clear or pale yellow.  Do not have sex until treatment is completed and you have followed up with your caregiver. STDs should be checked after treatment.  Keep all follow-up appointments, Pap tests, and blood tests as directed by your caregiver.  Only use latex condoms and water-soluble lubricants during sexual activity. Do not use  petroleum jelly or oils.  Avoid alcohol and illegal drugs.  Get vaccinated for HPV and hepatitis. If you have not received these vaccines in the past, talk to your caregiver about whether one or both might be right for you.  Avoid risky sex practices that can break the skin. The only way to avoid getting an STD is to avoid all sexual activity.Latex condoms and dental dams (for oral sex) will help lessen the risk of getting an STD, but will not completely eliminate the risk. SEEK MEDICAL CARE IF:   You have a fever.  You have any new or worsening symptoms. Document Released: 06/09/2002 Document Revised: 06/11/2011 Document Reviewed: 06/16/2010 [facility] Patient Information 2013 [place].    Domestic Abuse You are being battered or abused if someone close to you hits, pushes, or physically hurts you in any way. You also are being abused if you are forced into activities. You are being sexually abused if you are forced to have sexual contact of any kind. You are being emotionally abused if you are made to feel worthless or if you are constantly threatened. It is important to remember that help is available. No one has the right to abuse you. PREVENTION OF FURTHER  ABUSE  Learn the warning signs of danger. This varies with situations but may include: the use of alcohol, threats, isolation from friends and family, or forced sexual contact. Leave if you feel that violence is going to occur.  If you are attacked or beaten, report it to the police so the abuse is documented. You do not have to press charges. The police can protect you while you or the attackers are leaving. Get the officer's name and badge number and a copy of the report.  Find someone you can trust and tell them what is happening to you: your caregiver, a nurse, clergy member, close friend or family member. Feeling ashamed is natural, but remember that you have done nothing wrong. No one deserves abuse. Document Released:  03/16/2000 Document Revised: 06/11/2011 Document Reviewed: 05/25/2010 ExitCare Patient Information 2013 ExitCare[employer] LLC.    How Much is Too Much Alcohol? Drinking too much alcohol can cause injury, accidents, and health problems. These types of problems can include:   Car crashes.  Falls.  Family fighting (domestic violence).  Drowning.  Fights.  Injuries.  Burns.  Damage to certain organs.  Having a baby with birth defects. ONE DRINK CAN BE TOO MUCH WHEN YOU ARE:  Working.  Pregnant or breastfeeding.  Taking medicines. Ask your doctor.  Driving or planning to drive. If you or someone you know has a drinking problem, get help from a doctor.  Document Released: 01/13/2009 Document Revised: 06/11/2011 Document Reviewed: 01/13/2009 ExitCare Patient Information 2013 ExitCare, LLC.   Smoking Hazards Smoking cigarettes is extremely bad for your health. Tobacco smoke has over 200 known poisons in it. There are over 60 chemicals in tobacco smoke that cause cancer. Some of the chemicals found in cigarette smoke include:   Cyanide.  Benzene.  Formaldehyde.  Methanol (wood alcohol).  Acetylene (fuel used in welding torches).  Ammonia. Cigarette smoke also contains the poisonous gases nitrogen oxide and carbon monoxide.  Cigarette smokers have an increased risk of many serious medical problems and Smoking causes approximately:  90% of all lung cancer deaths in men.  80% of all lung cancer deaths in women.  90% of deaths from chronic obstructive lung disease. Compared with nonsmokers, smoking increases the risk of:  Coronary heart disease by 2 to 4 times.  Stroke by 2 to 4 times.  Men developing lung cancer by 23 times.  Women developing lung cancer by 13 times.  Dying from chronic obstructive lung diseases by 12 times.  . Smoking is the most preventable cause of death and disease in our society.  WHY IS SMOKING ADDICTIVE?  Nicotine is the chemical  agent in tobacco that is capable of causing addiction or dependence.  When you smoke and inhale, nicotine is absorbed rapidly into the bloodstream through your lungs. Nicotine absorbed through the lungs is capable of creating a powerful addiction. Both inhaled and non-inhaled nicotine may be addictive.  Addiction studies of cigarettes and spit tobacco show that addiction to nicotine occurs mainly during the teen years, when young people begin using tobacco products. WHAT ARE THE BENEFITS OF QUITTING?  There are many health benefits to quitting smoking.   Likelihood of developing cancer and heart disease decreases. Health improvements are seen almost immediately.  Blood pressure, pulse rate, and breathing patterns start returning to normal soon after quitting. QUITTING SMOKING   American Lung Association - 1-800-LUNGUSA  American Cancer Society - 1-800-ACS-2345 Document Released: 04/26/2004 Document Revised: 06/11/2011 Document Reviewed: 12/29/2008 ExitCare Patient Information 2013 ExitCare,   LLC.   Stress Management Stress is a state of physical or mental tension that often results from changes in your life or normal routine. Some common causes of stress are:  Death of a loved one.  Injuries or severe illnesses.  Getting fired or changing jobs.  Moving into a new home. Other causes may be:  Sexual problems.  Business or financial losses.  Taking on a large debt.  Regular conflict with someone at home or at work.  Constant tiredness from lack of sleep. It is not just bad things that are stressful. It may be stressful to:  Win the lottery.  Get married.  Buy a new car. The amount of stress that can be easily tolerated varies from person to person. Changes generally cause stress, regardless of the types of change. Too much stress can affect your health. It may lead to physical or emotional problems. Too little stress (boredom) may also become stressful. SUGGESTIONS TO  REDUCE STRESS:  Talk things over with your family and friends. It often is helpful to share your concerns and worries. If you feel your problem is serious, you may want to get help from a professional counselor.  Consider your problems one at a time instead of lumping them all together. Trying to take care of everything at once may seem impossible. List all the things you need to do and then start with the most important one. Set a goal to accomplish 2 or 3 things each day. If you expect to do too many in a single day you will naturally fail, causing you to feel even more stressed.  Do not use alcohol or drugs to relieve stress. Although you may feel better for a short time, they do not remove the problems that caused the stress. They can also be habit forming.  Exercise regularly - at least 3 times per week. Physical exercise can help to relieve that "uptight" feeling and will relax you.  The shortest distance between despair and hope is often a good night's sleep.  Go to bed and get up on time allowing yourself time for appointments without being rushed.  Take a short "time-out" period from any stressful situation that occurs during the day. Close your eyes and take some deep breaths. Starting with the muscles in your face, tense them, hold it for a few seconds, then relax. Repeat this with the muscles in your neck, shoulders, hand, stomach, back and legs.  Take good care of yourself. Eat a balanced diet and get plenty of rest.  Schedule time for having fun. Take a break from your daily routine to relax. HOME CARE INSTRUCTIONS   Call if you feel overwhelmed by your problems and feel you can no longer manage them on your own.  Return immediately if you feel like hurting yourself or someone else. Document Released: 09/12/2000 Document Revised: 06/11/2011 Document Reviewed: 05/05/2007 [facility] Patient Information 2013 [place].   Menorrhagia Menorrhagia is when your menstrual  periods are heavy or last longer than normal. Follow these instructions at home: Medicines   Take over-the-counter and prescription medicines exactly as told by your doctor. This includes iron pills.  Do not change or switch medicines without asking your doctor.  Do not take aspirin or medicines that contain aspirin 1 week before or during your period. Aspirin may make bleeding worse. General instructions  If you need to change your pad or tampon more than once every 2 hours, limit your activity until the bleeding stops.  Iron pills can cause problems when pooping (constipation). To prevent or treat pooping problems while taking prescription iron pills, your doctor may suggest that you: ? Drink enough fluid to keep your pee (urine) clear or pale yellow. ? Take over-the-counter or prescription medicines. ? Eat foods that are high in fiber. These foods include:  Fresh fruits and vegetables.  Whole grains.  Beans. ? Limit foods that are high in fat and processed sugars. This includes fried and sweet foods.  Eat healthy meals and foods that are high in iron. Foods that have a lot of iron include: ? Leafy green vegetables. ? Meat. ? Liver. ? Eggs. ? Whole grain breads and cereals.  Do not try to lose weight until your heavy bleeding has stopped and you have normal amounts of iron in your blood. If you need to lose weight, work with your doctor.  Keep all follow-up visits as told by your doctor. This is important. Contact a doctor if:  You soak through a pad or tampon every 1 or 2 hours, and this happens every time you have a period.  You need to use pads and tampons at the same time because you are bleeding so much.  You are taking medicine and you: ? Feel sick to your stomach (nauseous). ? Throw up (vomit). ? Have watery poop (diarrhea).  You have other problems that may be related to the medicine you are taking. Get help right away if:  You soak through more than a pad  or tampon in 1 hour.  You pass clots bigger than 1 inch (2.5 cm) wide.  You feel short of breath.  You feel like your heart is beating too fast.  You feel dizzy or you pass out (faint).  You feel very weak or tired. Summary  Menorrhagia is when your menstrual periods are heavy or last longer than normal.  Take over-the-counter and prescription medicines exactly as told by your doctor. This includes iron pills.  Contact a doctor if you soak through more than a pad or tampon in 1 hour or are passing large clots. This information is not intended to replace advice given to you by your health care provider. Make sure you discuss any questions you have with your health care provider. Document Revised: 06/26/2017 Document Reviewed: 04/09/2016 Elsevier Patient Education  [place].  Anemia  Anemia is a condition in which you do not have enough red blood cells or hemoglobin. Hemoglobin is a substance in red blood cells that carries oxygen. When you do not have enough red blood cells or hemoglobin (are anemic), your body cannot get enough oxygen and your organs may not work properly. As a result, you may feel very tired or have other problems. What are the causes? Common causes of anemia include:  Excessive bleeding. Anemia can be caused by excessive bleeding inside or outside the body, including bleeding from the intestine or from periods in women.  Poor nutrition.  Long-lasting (chronic) kidney, thyroid, and liver disease.  Bone marrow disorders.  Cancer and treatments for cancer.  HIV (human immunodeficiency virus) and AIDS (acquired immunodeficiency syndrome).  Treatments for HIV and AIDS.  Spleen problems.  Blood disorders.  Infections, medicines, and autoimmune disorders that destroy red blood cells. What are the signs or symptoms? Symptoms of this condition include:  Minor weakness.  Dizziness.  Headache.  Feeling heartbeats that are irregular or faster  than normal (palpitations).  Shortness of breath, especially with exercise.  Paleness.  Cold sensitivity.  Indigestion.  Nausea.  Difficulty sleeping.  Difficulty concentrating. Symptoms may occur suddenly or develop slowly. If your anemia is mild, you may not have symptoms. How is this diagnosed? This condition is diagnosed based on:  Blood tests.  Your medical history.  A physical exam.  Bone marrow biopsy. Your health care provider may also check your stool (feces) for blood and may do additional testing to look for the cause of your bleeding. You may also have other tests, including:  Imaging tests, such as a CT scan or MRI.  Endoscopy.  Colonoscopy. How is this treated? Treatment for this condition depends on the cause. If you continue to lose a lot of blood, you may need to be treated at a hospital. Treatment may include:  Taking supplements of iron, vitamin S11, or folic acid.  Taking a hormone medicine (erythropoietin) that can help to stimulate red blood cell growth.  Having a blood transfusion. This may be needed if you lose a lot of blood.  Making changes to your diet.  Having surgery to remove your spleen. Follow these instructions at home:  Take over-the-counter and prescription medicines only as told by your health care provider.  Take supplements only as told by your health care provider.  Follow any diet instructions that you were given.  Keep all follow-up visits as told by your health care provider. This is important. Contact a health care provider if:  You develop new bleeding anywhere in the body. Get help right away if:  You are very weak.  You are short of breath.  You have pain in your abdomen or chest.  You are dizzy or feel faint.  You have trouble concentrating.  You have bloody or black, tarry stools.  You vomit repeatedly or you vomit up blood. Summary  Anemia is a condition in which you do not have enough red  blood cells or enough of a substance in your red blood cells that carries oxygen (hemoglobin).  Symptoms may occur suddenly or develop slowly.  If your anemia is mild, you may not have symptoms.  This condition is diagnosed with blood tests as well as a medical history and physical exam. Other tests may be needed.  Treatment for this condition depends on the cause of the anemia. This information is not intended to replace advice given to you by your health care provider. Make sure you discuss any questions you have with your health care provider. Document Revised: 03/01/2017 Document Reviewed: 04/20/2016 Elsevier Patient Education  [place].

## 2019-05-20 LAB — IRON AND TIBC
Iron Saturation: 56 % — ABNORMAL HIGH (ref 15–55)
Iron: 208 ug/dL — ABNORMAL HIGH (ref 27–159)
Total Iron Binding Capacity: 373 ug/dL (ref 250–450)
UIBC: 165 ug/dL (ref 131–425)

## 2019-05-20 LAB — CBC
Hematocrit: 36.6 % (ref 34.0–46.6)
Hemoglobin: 11.2 g/dL (ref 11.1–15.9)
MCH: 25.3 pg — ABNORMAL LOW (ref 26.6–33.0)
MCHC: 30.6 g/dL — ABNORMAL LOW (ref 31.5–35.7)
MCV: 83 fL (ref 79–97)
Platelets: 217 10*3/uL (ref 150–450)
RBC: 4.43 x10E6/uL (ref 3.77–5.28)
RDW: 16.3 % — ABNORMAL HIGH (ref 11.7–15.4)
WBC: 8.9 10*3/uL (ref 3.4–10.8)

## 2019-05-20 LAB — FERRITIN: Ferritin: 29 ng/mL (ref 15–150)

## 2019-05-21 DIAGNOSIS — F4323 Adjustment disorder with mixed anxiety and depressed mood: Secondary | ICD-10-CM | POA: Diagnosis not present

## 2019-05-21 LAB — CYTOLOGY - PAP
Comment: NEGATIVE
Diagnosis: NEGATIVE
High risk HPV: NEGATIVE

## 2019-05-26 ENCOUNTER — Other Ambulatory Visit: Payer: Self-pay | Admitting: *Deleted

## 2019-05-26 ENCOUNTER — Telehealth: Payer: Self-pay | Admitting: *Deleted

## 2019-05-26 DIAGNOSIS — F4323 Adjustment disorder with mixed anxiety and depressed mood: Secondary | ICD-10-CM | POA: Diagnosis not present

## 2019-05-26 DIAGNOSIS — N920 Excessive and frequent menstruation with regular cycle: Secondary | ICD-10-CM

## 2019-05-26 DIAGNOSIS — N946 Dysmenorrhea, unspecified: Secondary | ICD-10-CM

## 2019-05-26 DIAGNOSIS — Z862 Personal history of diseases of the blood and blood-forming organs and certain disorders involving the immune mechanism: Secondary | ICD-10-CM

## 2019-05-26 NOTE — Telephone Encounter (Signed)
-----   Message from Verner Chol, CNM sent at 05/20/2019 12:46 PM EST ----- See previous note. Patient needs PUS to make sure no other issues due to size. If she wants to move forward with IUD as we discussed this can be done at the same time for confirmation of placement due to obesity. Schedule with Dr. Oscar La she is aware and agrees

## 2019-05-26 NOTE — Telephone Encounter (Signed)
Leda Min, RN  05/26/2019 4:37 PM EST    Left message to call Noreene Larsson, RN at [facility] [phone].   Order placed for PUS with Dr. Oscar La

## 2019-05-29 NOTE — Telephone Encounter (Signed)
Call to patient to review benefits for recommended PUS with Dr. Oscar Golden. Patient stated that she knew nothing about needing a PUS and that she has not decided on whether or not she would like to move forward with IUD placement. Call transferred to triage nurse, Margaret Golden, to review results and advise patient of Margaret Golden's recommendations.  Margaret Golden - Please verify and advise whether to proceed with PUS without IUD insertion, or to wait until patient has decided about IUD insertion.  Cc: Dr. Oscar Golden

## 2019-05-29 NOTE — Telephone Encounter (Signed)
Margaret Golden called pt to discuss PUS benefits. Pt misunderstood about having PUS without IUD placement. Pt still unsure about IUD placement. Margaret Golden to speak with Ander Slade, CMA re result note on 05/22/2019 from Ortencia Kick, CNM for direction with PUS.   Routing to [employer]

## 2019-05-30 NOTE — Telephone Encounter (Signed)
Discussed at  visit due heavy cycles PUS evaluation recommended to make sure no other cause for heavy periods.  She decide about cycle control when she feels necessary. I apologize if this was not clear, we discussed quite a lot at visit, especially being a new patient.

## 2019-06-01 NOTE — Telephone Encounter (Signed)
Left detailed message for pt to call back to if wants to schedule PUS or discuss BCM.   Encounter closed.

## 2019-06-02 DIAGNOSIS — F4323 Adjustment disorder with mixed anxiety and depressed mood: Secondary | ICD-10-CM | POA: Diagnosis not present

## 2019-06-09 ENCOUNTER — Ambulatory Visit (INDEPENDENT_AMBULATORY_CARE_PROVIDER_SITE_OTHER): Payer: BC Managed Care – PPO | Admitting: Family

## 2019-06-09 ENCOUNTER — Other Ambulatory Visit: Payer: Self-pay

## 2019-06-09 ENCOUNTER — Encounter: Payer: Self-pay | Admitting: Family

## 2019-06-09 VITALS — BP 103/79 | HR 103 | Temp 96.8°F | Resp 16 | Ht 67.0 in | Wt 371.0 lb

## 2019-06-09 DIAGNOSIS — F329 Major depressive disorder, single episode, unspecified: Secondary | ICD-10-CM

## 2019-06-09 DIAGNOSIS — E039 Hypothyroidism, unspecified: Secondary | ICD-10-CM | POA: Diagnosis not present

## 2019-06-09 DIAGNOSIS — D509 Iron deficiency anemia, unspecified: Secondary | ICD-10-CM

## 2019-06-09 DIAGNOSIS — F4323 Adjustment disorder with mixed anxiety and depressed mood: Secondary | ICD-10-CM | POA: Diagnosis not present

## 2019-06-09 DIAGNOSIS — M25562 Pain in left knee: Secondary | ICD-10-CM

## 2019-06-09 DIAGNOSIS — F32A Depression, unspecified: Secondary | ICD-10-CM

## 2019-06-09 MED ORDER — LEVOTHYROXINE SODIUM 175 MCG PO TABS: 175.0000 ug | ORAL_TABLET | Freq: Every day | ORAL | 4 refills | Status: DC

## 2019-06-09 MED ORDER — MELOXICAM 7.5 MG PO TABS: 7.5000 mg | ORAL_TABLET | Freq: Every day | ORAL | 0 refills | Status: DC

## 2019-06-09 NOTE — Patient Instructions (Signed)
Please complete lab work prior to leaving. Start meloxicam once daily as needed for the next 2 weeks for knee pain. Let me know if knee pain worsens or if it does not improve in the next 2 weeks and we will refer you to sports medicine.

## 2019-06-09 NOTE — Progress Notes (Signed)
Subjective:    Patient ID: Margaret Golden, female    DOB: [birth year], 29 y.o.   MRN: [id number]  HPI  Patient is a 29 yr old female who presents today with chief complaint of left sided knee pain.  Reports that she landed wrong on her left knee last Thursday/Friday she developed pain with walking. Now the pain is not as bad but "still there."  Using otc motrin prn.   Depression- following with psychiatry- Th[facility] Headings. Has been going well.    Wt Readings from Last 3 Encounters:  06/09/19 (!) 371 lb (168.3 kg)  05/19/19 (!) 368 lb (166.9 kg)  04/15/19 (!) 365 lb (165.6 kg)   Hypothyroid-she is maintained on Synthroid. Lab Results  Component Value Date   TSH 4.15 04/17/2018     Iron deficiency anemia- She reports that she is taking iron only once a day per GYN based on elevated iron level.  Lab Results  Component Value Date   WBC 8.9 05/19/2019   HGB 11.2 05/19/2019   HCT 36.6 05/19/2019   MCV 83 05/19/2019   PLT 217 05/19/2019   GERD- reports occasional gerd symptoms when she eats really last.  She is taking omeprazole with good improvement.    Review of Systems See HPI  Past Medical History:  Diagnosis Date  . ADD (attention deficit disorder)   . Anxiety   . Depression   . Exercise-induced asthma   . Fatty liver   . History of chicken pox   . History of kidney stones   . Migraines   . RLS (restless legs syndrome)   . Thyroid disease    hypothyroid     Social History   Socioeconomic History  . Marital status: Single    Spouse name: Not on file  . Number of children: Not on file  . Years of education: 42  . Highest education level: Not on file  Occupational History  . Not on file  Tobacco Use  . Smoking status: Never Smoker  . Smokeless tobacco: Never Used  Substance and Sexual Activity  . Alcohol use: Not Currently    Alcohol/week: 0.0 standard drinks  . Drug use: No  . Sexual activity: Never    Comment: never sexually active  Other Topics  Concern  . Not on file  Social History Narrative   Unemployed   Lives with Dad   Seeking work   Only child   Dog 3   Lives in a one story house with father, has a cat-12/26/16-sjb   Social Determinants of Radio broadcast assistant Strain:   . Difficulty of Paying Living Expenses: Not on file  Food Insecurity:   . Worried About Charity fundraiser in the Last Year: Not on file  . Ran Out of Food in the Last Year: Not on file  Transportation Needs:   . Lack of Transportation (Medical): Not on file  . Lack of Transportation (Non-Medical): Not on file  Physical Activity:   . Days of Exercise per Week: Not on file  . Minutes of Exercise per Session: Not on file  Stress:   . Feeling of Stress : Not on file  Social Connections:   . Frequency of Communication with Friends and Family: Not on file  . Frequency of Social Gatherings with Friends and Family: Not on file  . Attends Religious Services: Not on file  . Active Member of Clubs or Organizations: Not on file  . Attends Club or  Organization Meetings: Not on file  . Marital Status: Not on file  Intimate Partner Violence:   . Fear of Current or Ex-Partner: Not on file  . Emotionally Abused: Not on file  . Physically Abused: Not on file  . Sexually Abused: Not on file    Past Surgical History:  Procedure Laterality Date  . KNEE ARTHROSCOPY Right 2007  . TONSILLECTOMY  2010  . WISDOM TOOTH EXTRACTION      Family History  Problem Relation Age of Onset  . Depression Mother   . Depression Father   . Cancer Paternal Aunt        colon  . Cancer Paternal Uncle        brain cancer  . Diabetes Maternal Grandfather   . Arthritis Maternal Grandfather   . Arthritis Maternal Grandmother   . Breast cancer Maternal Grandmother   . Arthritis Paternal Grandmother   . Arthritis Paternal Grandfather     Allergies  Allergen Reactions  . Augmentin [Amoxicillin-Pot Clavulanate]     hives    Current Outpatient Medications on  File Prior to Visit  Medication Sig Dispense Refill  . albuterol (VENTOLIN HFA) 108 (90 Base) MCG/ACT inhaler Inhale 2 puffs into the lungs every 6 (six) hours as needed for wheezing or shortness of breath (Chest tightness). 18 g 0  . aspirin-acetaminophen-caffeine (EXCEDRIN MIGRAINE) 250-250-65 MG tablet Take by mouth every 6 (six) hours as needed for headache.    . ferrous sulfate 325 (65 FE) MG tablet Take 1 tablet (325 mg total) by mouth 2 (two) times daily with a meal. 60 tablet 1  . gabapentin (NEURONTIN) 600 MG tablet Take 1 tablet (600 mg total) by mouth at bedtime. 30 tablet 4  . lamoTRIgine (LAMICTAL) 200 MG tablet Take 1 tablet (200 mg total) by mouth daily. 30 tablet 3  . levothyroxine (SYNTHROID) 175 MCG tablet TAKE ONE TABLET BY MOUTH EVERY MORNING BEFORE BREAKFAST 30 tablet 4  . MOBIC 15 MG tablet Take 15 mg by mouth daily as needed for muscle pain.    . Multiple Vitamins-Minerals (MULTIVITAMIN WITH MINERALS) tablet Take 1 tablet by mouth daily.    Marland Kitchen omeprazole (PRILOSEC) 20 MG capsule Take 2 capsules (40 mg total) by mouth daily. 180 capsule 2  . sertraline (ZOLOFT) 100 MG tablet Take 2 tablets (200 mg total) by mouth daily. 60 tablet 3  . VITAMIN D PO Take by mouth.     No current facility-administered medications on file prior to visit.    BP 103/79 (BP Location: Right Arm, Patient Position: Sitting, Cuff Size: Large)   Pulse (!) 103   Temp (!) 96.8 F (36 C) (Temporal)   Resp 16   Ht 5\' 7"  (1.702 m)   Wt (!) 371 lb (168.3 kg)   LMP 05/14/2019 (Exact Date)   SpO2 100%   BMI 58.11 kg/m       Objective:   Physical Exam Constitutional:      Appearance: She is well-developed. She is morbidly obese.  Neck:     Thyroid: No thyromegaly.  Cardiovascular:     Rate and Rhythm: Normal rate and regular rhythm.     Heart sounds: Normal heart sounds. No murmur.  Pulmonary:     Effort: Pulmonary effort is normal. No respiratory distress.     Breath sounds: Normal breath  sounds. No wheezing.  Musculoskeletal:     Cervical back: Neck supple.     Comments: + tenderness to palpation of left knee medially beneath  patella  Full ROM left knee No significant swelling.  Neg drawer test  Skin:    General: Skin is warm and dry.  Neurological:     Mental Status: She is alert and oriented to person, place, and time.  Psychiatric:        Behavior: Behavior normal.        Thought Content: Thought content normal.        Judgment: Judgment normal.           Assessment & Plan:  Left Knee pain-advised patient on trial of meloxicam once daily as needed for the next 2 weeks.  She will let me know if symptoms worsen or if they fail to improve.  Plan to refer to sports medicine at that time.  Hypothyroid-clinically stable obtain follow-up TSH.  Depression-this is being treated by psychiatry.  She reports overall doing well.  Having some situational depression due to her father having moved to [place].  She is currently here alone without any support system.  She is not currently working.  Iron deficiency anemia-she is currently working with GYN.  She is contemplating insertion of an IUD to help control her heavy menstrual cycles.  Her follow-up CBC was much improved.  This visit occurred during the SARS-CoV-2 public health emergency.  Safety protocols were in place, including screening questions prior to the visit, additional usage of staff PPE, and extensive cleaning of exam room while observing appropriate contact time as indicated for disinfecting solutions.

## 2019-06-10 LAB — TSH: TSH: 26.48 u[IU]/mL — ABNORMAL HIGH (ref 0.35–4.50)

## 2019-06-11 ENCOUNTER — Other Ambulatory Visit: Payer: Self-pay | Admitting: Family

## 2019-06-11 DIAGNOSIS — E039 Hypothyroidism, unspecified: Secondary | ICD-10-CM

## 2019-06-11 MED ORDER — LEVOTHYROXINE SODIUM 200 MCG PO TABS: 200.0000 ug | ORAL_TABLET | Freq: Every day | ORAL | 0 refills | Status: DC

## 2019-06-11 NOTE — Telephone Encounter (Signed)
Lvm for patient to call back about her results 

## 2019-06-11 NOTE — Telephone Encounter (Signed)
Please contact patient and let her know that her thyroid test indicates that we need to increase the dose of her thyroid medication.  Please confirm that she has been taking her thyroid medication regularly.  If so we will increase it to 200 mcg once daily.  I have pended the Rx.  She should take her Synthroid in the morning on an empty stomach wait 30 minutes before she eats or takes any other medications.  Repeat TSH in 6 weeks.

## 2019-06-15 ENCOUNTER — Encounter: Payer: Self-pay | Admitting: Certified Nurse Midwife

## 2019-06-15 DIAGNOSIS — F4323 Adjustment disorder with mixed anxiety and depressed mood: Secondary | ICD-10-CM | POA: Diagnosis not present

## 2019-06-17 ENCOUNTER — Encounter: Payer: Self-pay | Admitting: Certified Nurse Midwife

## 2019-06-19 ENCOUNTER — Other Ambulatory Visit: Payer: Self-pay

## 2019-06-19 ENCOUNTER — Ambulatory Visit (INDEPENDENT_AMBULATORY_CARE_PROVIDER_SITE_OTHER): Payer: BC Managed Care – PPO | Admitting: Certified Nurse Midwife

## 2019-06-19 ENCOUNTER — Encounter: Payer: Self-pay | Admitting: Certified Nurse Midwife

## 2019-06-19 ENCOUNTER — Telehealth: Payer: Self-pay | Admitting: Certified Nurse Midwife

## 2019-06-19 ENCOUNTER — Other Ambulatory Visit: Payer: Self-pay | Admitting: Certified Nurse Midwife

## 2019-06-19 VITALS — BP 114/80 | HR 68 | Temp 97.0°F | Resp 16

## 2019-06-19 DIAGNOSIS — N920 Excessive and frequent menstruation with regular cycle: Secondary | ICD-10-CM

## 2019-06-19 DIAGNOSIS — E038 Other specified hypothyroidism: Secondary | ICD-10-CM | POA: Diagnosis not present

## 2019-06-19 DIAGNOSIS — Z862 Personal history of diseases of the blood and blood-forming organs and certain disorders involving the immune mechanism: Secondary | ICD-10-CM | POA: Diagnosis not present

## 2019-06-19 DIAGNOSIS — N921 Excessive and frequent menstruation with irregular cycle: Secondary | ICD-10-CM | POA: Diagnosis not present

## 2019-06-19 MED ORDER — NORETHINDRONE 0.35 MG PO TABS: 1.0000 | ORAL_TABLET | Freq: Every day | ORAL | 1 refills | Status: DC

## 2019-06-19 NOTE — Telephone Encounter (Signed)
Patient wants to speak with the nurse.Patient states she has been on her cycle for 14 days.

## 2019-06-19 NOTE — Progress Notes (Signed)
29 y.o.Single Caucasian female presents with menses since 06/06/2019 and have been heavy to the point of soaking two pad a day x 9 days and day 9 to 14 started slowing down. Has passes no blood clots. Patient was experiencing some dizziness and faintness. This has resolved. Has been busy with house adjustment and not taking time for self. Father has moved out. Not sexually active at present. Was seen ER in January for weakness related to periods. Hgb. was 8.4 and she was given transfusion and has felt better. Per patient this has been less than that occurrence. Trying to drink adequate fluids each day and eat better. Taking iron once daily.  Currently periods are occurring every 20 -22 days. Past few months have been longer and longer. She was to come in for PUS to make sure no other issues and canceled. She has decided on IUD to help get the bleeding under control as discussed at last visit.  "Really want to feel better". Patient has hypothyroid and recent increase in medication from endocrine and aware this can affect her cycles   Pertinent items are noted in HPI. Physical Exam Constitutional:      Appearance: Normal appearance. She is obese.  Cardiovascular:     Rate and Rhythm: Normal rate.     Pulses: Normal pulses.  Pulmonary:     Effort: Pulmonary effort is normal.  Abdominal:     Palpations: Abdomen is soft.  Genitourinary:    General: Normal vulva.     Exam position: Lithotomy position.     Labia:        Right: No tenderness or lesion.        Left: No tenderness or lesion.      Vagina: Normal. No vaginal discharge or tenderness.     Cervix: No discharge or cervical bleeding.     Uterus: Normal.      Adnexa:        Right: No mass, tenderness or fullness.         Left: No mass, tenderness or fullness.       Rectum: Normal.     Comments: Uterus does not feel boggy and no enlargement with difficult to palpate Unable to feel adnexal due to body habitus, no large masses  noted Musculoskeletal:     Cervical back: No tenderness.  Lymphadenopathy:     Lower Body: No right inguinal adenopathy. No left inguinal adenopathy.  Skin:    General: Skin is warm and dry.  Neurological:     Mental Status: She is alert and oriented to person, place, and time.  Psychiatric:        Mood and Affect: Mood normal.        Behavior: Behavior normal.        Thought Content: Thought content normal.        Judgment: Judgment normal.      Assessment: Menorrhagia with irregular/regular periods for the past year History of transfusion for anemia on Iron supplement now History of hypothyroid  Plan: Discussed with patient factors that may be contributory to menstrual abnormalities include thyroid, obesity, fibroids.  Discussed warning signs with excessive bleeding. Discussed starting on progesterone only pill to help with keeping uterine lining thin to decrease risk of heavy bleeding. Agreeable. Rx Micronor see order with instructions. Discussed recent change in thyroid medication can also change periods Discussed taking iron supplement with orange juice twice daily and increase iron foods in diet. Continue follow up with MD regarding thyroid. Patient  desires IUD, will schedule Gyn PUS and then will have US guided Mirena IUD insertion with Dr. Sabra Heck. Patient agreeable and will be called to schedule. Questions addressed.  Rv prn

## 2019-06-19 NOTE — Patient Instructions (Addendum)
Menorrhagia Menorrhagia is when your menstrual periods are heavy or last longer than normal. Follow these instructions at home: Medicines   Take over-the-counter and prescription medicines exactly as told by your doctor. This includes iron pills.  Do not change or switch medicines without asking your doctor.  Do not take aspirin or medicines that contain aspirin 1 week before or during your period. Aspirin may make bleeding worse. General instructions  If you need to change your pad or tampon more than once every 2 hours, limit your activity until the bleeding stops.  Iron pills can cause problems when pooping (constipation). To prevent or treat pooping problems while taking prescription iron pills, your doctor may suggest that you: ? Drink enough fluid to keep your pee (urine) clear or pale yellow. ? Take over-the-counter or prescription medicines. ? Eat foods that are high in fiber. These foods include:  Fresh fruits and vegetables.  Whole grains.  Beans. ? Limit foods that are high in fat and processed sugars. This includes fried and sweet foods.  Eat healthy meals and foods that are high in iron. Foods that have a lot of iron include: ? Leafy green vegetables. ? Meat. ? Liver. ? Eggs. ? Whole grain breads and cereals.  Do not try to lose weight until your heavy bleeding has stopped and you have normal amounts of iron in your blood. If you need to lose weight, work with your doctor.  Keep all follow-up visits as told by your doctor. This is important. Contact a doctor if:  You soak through a pad or tampon every 1 or 2 hours, and this happens every time you have a period.  You need to use pads and tampons at the same time because you are bleeding so much.  You are taking medicine and you: ? Feel sick to your stomach (nauseous). ? Throw up (vomit). ? Have watery poop (diarrhea).  You have other problems that may be related to the medicine you are taking. Get help  right away if:  You soak through more than a pad or tampon in 1 hour.  You pass clots bigger than 1 inch (2.5 cm) wide.  You feel short of breath.  You feel like your heart is beating too fast.  You feel dizzy or you pass out (faint).  You feel very weak or tired. Summary  Menorrhagia is when your menstrual periods are heavy or last longer than normal.  Take over-the-counter and prescription medicines exactly as told by your doctor. This includes iron pills.  Contact a doctor if you soak through more than a pad or tampon in 1 hour or are passing large clots. This information is not intended to replace advice given to you by your health care provider. Make sure you discuss any questions you have with your health care provider. Document Revised: 06/26/2017 Document Reviewed: 04/09/2016 Elsevier Patient Education  2020 [employer].  Iron Deficiency Anemia, Adult Iron-deficiency anemia is when you have a low amount of red blood cells or hemoglobin. This happens because you have too little iron in your body. Hemoglobin carries oxygen to parts of the body. Anemia can cause your body to not get enough oxygen. It may or may not cause symptoms. Follow these instructions at home: Medicines  Take over-the-counter and prescription medicines only as told by your doctor. This includes iron pills (supplements) and vitamins.  If you cannot handle taking iron pills by mouth, ask your doctor about getting iron through: ? A vein (intravenously). ?  A shot (injection) into a muscle.  Take iron pills when your stomach is empty. If you cannot handle this, take them with food.  Do not drink milk or take antacids at the same time as your iron pills.  To prevent trouble pooping (constipation), eat fiber or take medicine (stool softener) as told by your doctor. Eating and drinking   Talk with your doctor before changing the foods you eat. He or she may tell you to eat foods that have a lot of iron,  such as: ? Liver. ? Lowfat (lean) beef. ? Breads and cereals that have iron added to them (fortified breads and cereals). ? Eggs. ? Dried fruit. ? Dark green, leafy vegetables.  Drink enough fluid to keep your pee (urine) clear or pale yellow.  Eat fresh fruits and vegetables that are high in vitamin C. They help your body to use iron. Foods with a lot of vitamin C include: ? Oranges. ? Peppers. ? Tomatoes. ? Mangoes. General instructions  Return to your normal activities as told by your doctor. Ask your doctor what activities are safe for you.  Keep yourself clean, and keep things clean around you (your surroundings). Anemia can make you get sick more easily.  Keep all follow-up visits as told by your doctor. This is important. Contact a doctor if:  You feel sick to your stomach (nauseous).  You throw up (vomit).  You feel weak.  You are sweating for no clear reason.  You have trouble pooping, such as: ? Pooping (having a bowel movement) less than 3 times a week. ? Straining to poop. ? Having poop that is hard, dry, or larger than normal. ? Feeling full or bloated. ? Pain in the lower belly. ? Not feeling better after pooping. Get help right away if:  You pass out (faint). If this happens, do not drive yourself to the hospital. Call your local emergency services (911 in the [place]  You have chest pain.  You have shortness of breath that: ? Is very bad. ? Gets worse with physical activity.  You have a fast heartbeat.  You get light-headed when getting up from sitting or lying down. This information is not intended to replace advice given to you by your health care provider. Make sure you discuss any questions you have with your health care provider. Document Revised: 03/01/2017 Document Reviewed: 12/07/2015 Elsevier Patient Education  [employer].

## 2019-06-19 NOTE — Telephone Encounter (Signed)
Spoke to pt. Pt states having bleeding since started LMP 06/06/2019 with having sx of dizziness, nausea, not steady on your feet for last 3-4 days. This morning woke up with a headache.  Pt has Hx of anemia and "feels the same way as before with these symptoms"  Pt states having medium to heavy flow, but changing only twice a day. Pt describes overnight pad that are half to full with every change of pad. Pt now states bleeding has become lighter, but hasn't stopped at all in 14 days. Advised pt to be seen today. Pt agreeable. Pt scheduled with Debbi, CNM at 4:30 pm. Pt verbalized understanding and CPS Neg.   Routing to D. Darcel Bayley, CNM for review and will close encounter.

## 2019-06-20 LAB — IRON AND TIBC
Iron Saturation: 6 % — CL (ref 15–55)
Iron: 22 ug/dL — ABNORMAL LOW (ref 27–159)
Total Iron Binding Capacity: 379 ug/dL (ref 250–450)
UIBC: 357 ug/dL (ref 131–425)

## 2019-06-20 LAB — CBC
Hematocrit: 30.8 % — ABNORMAL LOW (ref 34.0–46.6)
Hemoglobin: 9.4 g/dL — ABNORMAL LOW (ref 11.1–15.9)
MCH: 23.9 pg — ABNORMAL LOW (ref 26.6–33.0)
MCHC: 30.5 g/dL — ABNORMAL LOW (ref 31.5–35.7)
MCV: 78 fL — ABNORMAL LOW (ref 79–97)
Platelets: 249 10*3/uL (ref 150–450)
RBC: 3.94 x10E6/uL (ref 3.77–5.28)
RDW: 14.5 % (ref 11.7–15.4)
WBC: 8.5 10*3/uL (ref 3.4–10.8)

## 2019-06-20 LAB — FERRITIN: Ferritin: 20 ng/mL (ref 15–150)

## 2019-06-22 ENCOUNTER — Other Ambulatory Visit: Payer: Self-pay | Admitting: Certified Nurse Midwife

## 2019-06-22 ENCOUNTER — Telehealth: Payer: Self-pay | Admitting: *Deleted

## 2019-06-22 DIAGNOSIS — N920 Excessive and frequent menstruation with regular cycle: Secondary | ICD-10-CM

## 2019-06-22 DIAGNOSIS — N946 Dysmenorrhea, unspecified: Secondary | ICD-10-CM

## 2019-06-22 DIAGNOSIS — Z30430 Encounter for insertion of intrauterine contraceptive device: Secondary | ICD-10-CM

## 2019-06-22 DIAGNOSIS — Z862 Personal history of diseases of the blood and blood-forming organs and certain disorders involving the immune mechanism: Secondary | ICD-10-CM

## 2019-06-22 DIAGNOSIS — D500 Iron deficiency anemia secondary to blood loss (chronic): Secondary | ICD-10-CM

## 2019-06-22 MED ORDER — FUSION PLUS PO CAPS: ORAL_CAPSULE | ORAL | 1 refills | Status: AC

## 2019-06-22 NOTE — Telephone Encounter (Signed)
-----   Message from Verner Chol, CNM sent at 06/19/2019  5:49 PM EDT ----- Patient needs PUS scheduled soon! To evaluate for menorrhagia sourceShe also has agreed to Mirena IUD insertion for cycle controlDr.Miller to insert and will use US guidance after full Gyn US done and evaluated same day per conversation with her. Patient is aware and agreeable. Please schedule. She is aware she will be called with appt.  See previous note

## 2019-06-22 NOTE — Telephone Encounter (Signed)
Left message to call Afton Mikelson, RN at [facility] [phone].   

## 2019-06-23 DIAGNOSIS — F4323 Adjustment disorder with mixed anxiety and depressed mood: Secondary | ICD-10-CM | POA: Diagnosis not present

## 2019-06-25 ENCOUNTER — Ambulatory Visit: Payer: BC Managed Care – PPO | Attending: Internal Medicine

## 2019-06-25 DIAGNOSIS — Z23 Encounter for immunization: Secondary | ICD-10-CM

## 2019-06-25 NOTE — Progress Notes (Signed)
   Covid-19 Vaccination Clinic  Name:  Margaret Golden    MRN: [id number] DOB: 1990/12/15  06/25/2019  Margaret Golden was observed post Covid-19 immunization for 15 minutes without incident. She was provided with Vaccine Information Sheet and instruction to access the V-Safe system.   Margaret Golden was instructed to call 911 with any severe reactions post vaccine: Marland Kitchen Difficulty breathing  . Swelling of face and throat  . A fast heartbeat  . A bad rash all over body  . Dizziness and weakness   Immunizations Administered    Name Date Dose VIS Date Route   Pfizer COVID-19 Vaccine 06/25/2019  1:33 PM 0.3 mL 03/13/2019 Intramuscular   Manufacturer: [employer], [employer]   Lot: [id number]   NDC: [address]-3

## 2019-06-26 ENCOUNTER — Telehealth: Payer: Self-pay | Admitting: Family

## 2019-06-26 NOTE — Telephone Encounter (Signed)
Talked to patient again and advised her of provider's recommendations. She is hesitant to go to there er. I advised her this was the provider's advised given the severity of her symptoms. She verbalized understanding.

## 2019-06-26 NOTE — Telephone Encounter (Signed)
Lvm for patient to know provider's advise and mychart message sent with this same message

## 2019-06-26 NOTE — Telephone Encounter (Signed)
Please contact pt and let her know that I reviewed her appointment reason for visit on Monday. If she is having active SOB/Palpitations/Chest pain- I would recommend that she go to the ER today and not wait until her visit with me on Monday.

## 2019-06-29 ENCOUNTER — Encounter (HOSPITAL_BASED_OUTPATIENT_CLINIC_OR_DEPARTMENT_OTHER): Payer: Self-pay | Admitting: Emergency Medicine

## 2019-06-29 ENCOUNTER — Other Ambulatory Visit: Payer: Self-pay

## 2019-06-29 ENCOUNTER — Ambulatory Visit (INDEPENDENT_AMBULATORY_CARE_PROVIDER_SITE_OTHER): Payer: BC Managed Care – PPO | Admitting: Family

## 2019-06-29 ENCOUNTER — Encounter: Payer: Self-pay | Admitting: Family

## 2019-06-29 ENCOUNTER — Emergency Department (HOSPITAL_BASED_OUTPATIENT_CLINIC_OR_DEPARTMENT_OTHER): Payer: BC Managed Care – PPO

## 2019-06-29 ENCOUNTER — Emergency Department (HOSPITAL_BASED_OUTPATIENT_CLINIC_OR_DEPARTMENT_OTHER)
Admission: EM | Admit: 2019-06-29 | Discharge: 2019-06-29 | Disposition: A | Discharge status: Home / Self Care | Payer: BC Managed Care – PPO | Attending: Emergency Medicine | Admitting: Emergency Medicine

## 2019-06-29 VITALS — BP 127/77 | HR 100 | Temp 97.7°F | Resp 16 | Ht 67.0 in | Wt 379.0 lb

## 2019-06-29 DIAGNOSIS — R0789 Other chest pain: Secondary | ICD-10-CM | POA: Diagnosis not present

## 2019-06-29 DIAGNOSIS — J45909 Unspecified asthma, uncomplicated: Secondary | ICD-10-CM | POA: Insufficient documentation

## 2019-06-29 DIAGNOSIS — R079 Chest pain, unspecified: Secondary | ICD-10-CM | POA: Diagnosis not present

## 2019-06-29 DIAGNOSIS — R0602 Shortness of breath: Secondary | ICD-10-CM | POA: Insufficient documentation

## 2019-06-29 DIAGNOSIS — R0609 Other forms of dyspnea: Secondary | ICD-10-CM

## 2019-06-29 DIAGNOSIS — R0600 Dyspnea, unspecified: Secondary | ICD-10-CM | POA: Diagnosis not present

## 2019-06-29 DIAGNOSIS — I10 Essential (primary) hypertension: Secondary | ICD-10-CM | POA: Insufficient documentation

## 2019-06-29 DIAGNOSIS — E039 Hypothyroidism, unspecified: Secondary | ICD-10-CM | POA: Diagnosis not present

## 2019-06-29 DIAGNOSIS — Z79899 Other long term (current) drug therapy: Secondary | ICD-10-CM | POA: Diagnosis not present

## 2019-06-29 LAB — CBC
HCT: 31.9 % — ABNORMAL LOW (ref 36.0–46.0)
Hemoglobin: 9.3 g/dL — ABNORMAL LOW (ref 12.0–15.0)
MCH: 22.9 pg — ABNORMAL LOW (ref 26.0–34.0)
MCHC: 29.2 g/dL — ABNORMAL LOW (ref 30.0–36.0)
MCV: 78.6 fL — ABNORMAL LOW (ref 80.0–100.0)
Platelets: 205 10*3/uL (ref 150–400)
RBC: 4.06 MIL/uL (ref 3.87–5.11)
RDW: 14.6 % (ref 11.5–15.5)
WBC: 9 10*3/uL (ref 4.0–10.5)
nRBC: 0 % (ref 0.0–0.2)

## 2019-06-29 LAB — BASIC METABOLIC PANEL
Anion gap: 8 (ref 5–15)
BUN: 12 mg/dL (ref 6–20)
CO2: 27 mmol/L (ref 22–32)
Calcium: 9.1 mg/dL (ref 8.9–10.3)
Chloride: 105 mmol/L (ref 98–111)
Creatinine, Ser: 0.92 mg/dL (ref 0.44–1.00)
GFR calc Af Amer: >60 mL/min (ref >60)
GFR calc non Af Amer: >60 mL/min (ref >60)
Glucose, Bld: 98 mg/dL (ref 70–99)
Potassium: 4.1 mmol/L (ref 3.5–5.1)
Sodium: 140 mmol/L (ref 135–145)

## 2019-06-29 LAB — TROPONIN I (HIGH SENSITIVITY): Troponin I (High Sensitivity): <2 ng/L (ref <18)

## 2019-06-29 LAB — PREGNANCY, URINE: Preg Test, Ur: NEGATIVE

## 2019-06-29 MED ORDER — SODIUM CHLORIDE 0.9% FLUSH
3.0000 mL | Freq: Once | INTRAVENOUS | Status: AC
  Administered 2019-06-29: 3 mL via INTRAVENOUS
  Filled 2019-06-29: qty 3

## 2019-06-29 MED ORDER — PREDNISONE 50 MG PO TABS: 50.0000 mg | ORAL_TABLET | Freq: Every day | ORAL | 0 refills | Status: DC

## 2019-06-29 MED ORDER — LORAZEPAM 2 MG/ML IJ SOLN
1.0000 mg | Freq: Once | INTRAMUSCULAR | Status: AC
  Administered 2019-06-29: 1 mg via INTRAVENOUS
  Filled 2019-06-29: qty 1

## 2019-06-29 MED ORDER — ALBUTEROL SULFATE HFA 108 (90 BASE) MCG/ACT IN AERS
2.0000 | INHALATION_SPRAY | Freq: Once | RESPIRATORY_TRACT | Status: AC
  Administered 2019-06-29: 2 via RESPIRATORY_TRACT
  Filled 2019-06-29: qty 6.7

## 2019-06-29 MED ORDER — SODIUM CHLORIDE 0.9 % IV BOLUS
1000.0000 mL | Freq: Once | INTRAVENOUS | Status: AC
  Administered 2019-06-29: 1000 mL via INTRAVENOUS

## 2019-06-29 MED ORDER — IOHEXOL 350 MG/ML SOLN
100.0000 mL | Freq: Once | INTRAVENOUS | Status: AC | PRN
  Administered 2019-06-29: 16:00:00 100 mL via INTRAVENOUS

## 2019-06-29 NOTE — Discharge Instructions (Signed)
Take medications as prescribed.  Use the albuterol inhaler at home.  Follow-up with primary care provider in 2 days.  Return for new or worsening symptoms

## 2019-06-29 NOTE — Patient Instructions (Signed)
Please proceed to the ED on the first floor.  

## 2019-06-29 NOTE — ED Notes (Signed)
States has had heavy periods and has been under care of GYN , last saw her dr  A week ago   , was placed on BCP and  Now she is to get an IUD, last period 2 weeks ago states  Last period lasted 2 weeks  Of heavy flow

## 2019-06-29 NOTE — ED Triage Notes (Signed)
PT presents with shortness of breath and chest tightness since last tueday . Pt was sent form doctors office upstairs

## 2019-06-29 NOTE — ED Provider Notes (Signed)
[facility] Provider Note   CSN: [id number] Arrival date & time: 06/29/19  1138     History Chief Complaint  Patient presents with  . Chest Pain  . Shortness of Breath    Margaret Golden is a 29 y.o. female with past medical history significant for anxiety, depression, asthma, migraine, anemia who presents for evaluation of chest pain and shortness of breath.  Patient has had intermittent chest tightness and shortness of breath x1 week.  Denies any lateral leg swelling.  Is not take anything for symptoms.  States she does have some degree of shortness of breath with exertion at baseline.  States possibly feels like her prior episodes of asthma.  She denies fever, chills, nausea, vomiting, radiation of pain to the left arm, left jaw, diaphoresis, lightheadedness, dizziness abdominal pain, diarrhea.  No unilateral leg swelling, redness, warmth, recent surgery, malignancy, immobilization.  Denies aggravating or alleviating factors.  States she has had similar symptoms with symptomatic anemia.  Generally has very heavy menstrual cycles.  Last menstrual cycle 2 weeks ago.  Has needed to be admitted in the past for anemia with transfusion.  She is currently on iron.  Patient sent by PCP to rule out PE, anemia as cause of symptoms.  History obtained from patient and past medical record.  No interpreter is used.  HPI     Past Medical History:  Diagnosis Date  . ADD (attention deficit disorder)   . Anxiety   . Depression   . Exercise-induced asthma   . Fatty liver   . History of chicken pox   . History of kidney stones   . Migraines   . RLS (restless legs syndrome)   . Thyroid disease    hypothyroid    Patient Active Problem List   Diagnosis Date Noted  . Symptomatic anemia 04/07/2019  . Social phobia 02/26/2018  . Major depressive disorder, recurrent episode, mild ([place]) 02/26/2018  . Insomnia 02/26/2018  . Fatty liver   . Left ankle pain  02/28/2017  . Morbid obesity ([place]) 05/11/2016  . Fatty liver disease, nonalcoholic 82/50/5397  . Depression 01/03/2015  . Hypothyroidism 01/03/2015  . GERD (gastroesophageal reflux disease) 01/03/2015  . Migraines 01/03/2015  . Essential hypertension 06/02/2014    Past Surgical History:  Procedure Laterality Date  . KNEE ARTHROSCOPY Right 2007  . TONSILLECTOMY  2010  . WISDOM TOOTH EXTRACTION       OB History    Gravida  0   Para  0   Term  0   Preterm  0   AB  0   Living  0     SAB  0   TAB  0   Ectopic  0   Multiple  0   Live Births  0           Family History  Problem Relation Age of Onset  . Depression Mother   . Depression Father   . Cancer Paternal Aunt        colon  . Cancer Paternal Uncle        brain cancer  . Diabetes Maternal Grandfather   . Arthritis Maternal Grandfather   . Arthritis Maternal Grandmother   . Breast cancer Maternal Grandmother   . Arthritis Paternal Grandmother   . Arthritis Paternal Grandfather     Social History   Tobacco Use  . Smoking status: Never Smoker  . Smokeless tobacco: Never Used  Substance Use Topics  . Alcohol  use: Not Currently    Alcohol/week: 0.0 standard drinks  . Drug use: No    Home Medications Prior to Admission medications   Medication Sig Start Date End Date Taking? Authorizing Provider  albuterol (VENTOLIN HFA) 108 (90 Base) MCG/ACT inhaler Inhale 2 puffs into the lungs every 6 (six) hours as needed for wheezing or shortness of breath (Chest tightness). 04/01/19   Sharlene DoryWendling, Nicholas Paul, DO  aspirin-acetaminophen-caffeine (EXCEDRIN MIGRAINE) [id number] MG tablet Take by mouth every 6 (six) hours as needed for headache.    [provider]  ferrous sulfate 325 (65 FE) MG tablet Take 1 tablet (325 mg total) by mouth 2 (two) times daily with a meal. 04/08/19   Burnadette PopAdhikari, Amrit, MD  gabapentin (NEURONTIN) 600 MG tablet Take 1 tablet (600 mg total) by mouth at bedtime. 05/11/19 08/09/19   Corie Chiquitoarter, Jessica, PMHNP  Iron-FA-B Cmp-C-Biot-Probiotic (FUSION PLUS) CAPS One capsule twice daily with orange juice. Stop your Ferrous sulfate. 06/22/19   Verner CholLeonard, Deborah S, CNM  lamoTRIgine (LAMICTAL) 200 MG tablet Take 1 tablet (200 mg total) by mouth daily. 05/11/19 06/19/19  Corie Chiquitoarter, Jessica, PMHNP  levothyroxine (SYNTHROID) 200 MCG tablet Take 1 tablet (200 mcg total) by mouth daily before breakfast. 06/11/19   [name]'Sullivan, Melissa, NP  meloxicam (MOBIC) 7.5 MG tablet Take 1 tablet (7.5 mg total) by mouth daily. 06/09/19   [name]'Sullivan, Melissa, NP  Multiple Vitamins-Minerals (MULTIVITAMIN WITH MINERALS) tablet Take 1 tablet by mouth daily. 02/02/15   [name]'Sullivan, Melissa, NP  omeprazole (PRILOSEC) 20 MG capsule Take 2 capsules (40 mg total) by mouth daily. 04/01/19   Sharlene DoryWendling, Nicholas Paul, DO  predniSONE (DELTASONE) 50 MG tablet Take 1 tablet (50 mg total) by mouth daily. 06/29/19   Asriel Westrup A, PA-C  sertraline (ZOLOFT) 100 MG tablet Take 2 tablets (200 mg total) by mouth daily. 05/11/19 08/09/19  Corie Chiquitoarter, Jessica, PMHNP  VITAMIN D PO Take by mouth.    [provider]    Allergies    Augmentin [amoxicillin-pot clavulanate]  Review of Systems   Review of Systems  Constitutional: Negative.   HENT: Negative.   Respiratory: Positive for chest tightness and shortness of breath. Negative for apnea, cough, choking, wheezing and stridor.   Cardiovascular: Positive for chest pain. Negative for palpitations and leg swelling.  Gastrointestinal: Negative.   Genitourinary: Negative.   Musculoskeletal: Negative.   Skin: Negative.   Neurological: Negative.   All other systems reviewed and are negative.   Physical Exam Updated Vital Signs BP 137/87 (BP Location: Right Arm)   Pulse 88   Temp 98.4 F (36.9 C) (Oral)   Resp 18   LMP 06/06/2019 (Exact Date)   SpO2 100%   Physical Exam Vitals and nursing note reviewed.  Constitutional:      General: She is not in acute distress.     Appearance: She is well-developed. She is obese. She is not ill-appearing or toxic-appearing.  HENT:     Head: Normocephalic and atraumatic.  Eyes:     Pupils: Pupils are equal, round, and reactive to light.  Cardiovascular:     Rate and Rhythm: Normal rate.     Pulses:          Radial pulses are 2+ on the right side and 2+ on the left side.     Heart sounds: Normal heart sounds.  Pulmonary:     Effort: Pulmonary effort is normal. No respiratory distress.     Comments: Very minimal expiratory wheeze.  Speaks in full sentences without  difficulty. Chest:     Comments: No chest wall tenderness palpation, crepitus or step-off Abdominal:     General: Bowel sounds are normal. There is no distension.     Palpations: Abdomen is soft.     Comments: Soft, nontender without rebound or guarding  Musculoskeletal:        General: Normal range of motion.     Cervical back: Normal range of motion.     Right lower leg: No tenderness. No edema.     Left lower leg: No tenderness. No edema.     Comments: Compartments soft.  Denna Haggard' sign negative. No bony tenderness.  Moves all 4 extremities at difficulty.  Skin:    General: Skin is warm and dry.     Capillary Refill: Capillary refill takes less than 2 seconds.     Comments: Brisk capillary refill.  No edema, erythema or warmth  Neurological:     General: No focal deficit present.     Mental Status: She is alert and oriented to person, place, and time.    ED Results / Procedures / Treatments   Labs (all labs ordered are listed, but only abnormal results are displayed) Labs Reviewed  CBC - Abnormal; Notable for the following components:      Result Value   Hemoglobin 9.3 (*)    HCT 31.9 (*)    MCV 78.6 (*)    MCH 22.9 (*)    MCHC 29.2 (*)    All other components within normal limits  BASIC METABOLIC PANEL  PREGNANCY, URINE  TROPONIN I (HIGH SENSITIVITY)  TROPONIN I (HIGH SENSITIVITY)    EKG EKG Interpretation  Date/Time:  Monday  June 29 2019 11:45:09 EDT Ventricular Rate:  94 PR Interval:  114 QRS Duration: 86 QT Interval:  352 QTC Calculation: 440 R Axis:   43 Text Interpretation: Normal sinus rhythm Normal ECG No significant change since prior 1/21 Confirmed by Meridee Score [id number]) on 06/29/2019 11:55:04 AM   Radiology DG Chest 2 View  Result Date: 06/29/2019 CLINICAL DATA:  Shortness of breath and chest tightness for almost 1 week. EXAM: CHEST - 2 VIEW COMPARISON:  PA and lateral chest 04/06/2019. FINDINGS: Lungs clear. Heart size normal. No pneumothorax or pleural fluid. No acute or focal bony abnormality. IMPRESSION: Negative chest. Electronically Signed   By: Drusilla Kanner M.D.   On: 06/29/2019 12:34   CT Angio Chest PE W/Cm &/Or Wo Cm  Result Date: 06/29/2019 CLINICAL DATA:  Chest pain and shortness of breath for 1 week EXAM: CT ANGIOGRAPHY CHEST WITH CONTRAST TECHNIQUE: Multidetector CT imaging of the chest was performed using the standard protocol during bolus administration of intravenous contrast. Multiplanar CT image reconstructions and MIPs were obtained to evaluate the vascular anatomy. CONTRAST:  OMNIPAQUE IOHEXOL 350 MG/ML SOLN COMPARISON:  06/29/2019 FINDINGS: Cardiovascular: This is a technically adequate evaluation of the pulmonary vasculature. No filling defects or pulmonary emboli. The heart is unremarkable without pericardial effusion. Thoracic aorta demonstrates normal caliber with no evidence of dissection. Mediastinum/Nodes: No enlarged mediastinal, hilar, or axillary lymph nodes. Thyroid gland, trachea, and esophagus demonstrate no significant findings. Lungs/Pleura: No acute pleural or parenchymal lung disease. Central airways are widely patent. Upper Abdomen: 5 mm nonobstructing calculus upper pole left kidney. Spleen is enlarged measuring 16.6 cm in anterior-posterior diameter. This is incompletely imaged on the study. Musculoskeletal: No acute or destructive bony lesions.  Reconstructed images demonstrate no additional findings. Review of the MIP images confirms the above findings. IMPRESSION: 1. No  evidence of pulmonary embolus. 2. No acute intrathoracic process. 3. Nonobstructing 5 mm left renal calculus. 4. Splenomegaly. Electronically Signed   By: Sharlet Salina M.D.   On: 06/29/2019 16:07    Procedures Procedures (including critical care time)  Medications Ordered in ED Medications  sodium chloride flush (NS) 0.9 % injection 3 mL (has no administration in time range)  sodium chloride 0.9 % bolus 1,000 mL (1,000 mLs Intravenous New Bag/Given 06/29/19 1520)  LORazepam (ATIVAN) injection 1 mg (1 mg Intravenous Given 06/29/19 1600)  albuterol (VENTOLIN HFA) 108 (90 Base) MCG/ACT inhaler 2 puff (2 puffs Inhalation Given 06/29/19 1520)  iohexol (OMNIPAQUE) 350 MG/ML injection 100 mL (100 mLs Intravenous Contrast Given 06/29/19 1553)   ED Course  I have reviewed the triage vital signs and the nursing notes.  Pertinent labs & imaging results that were available during my care of the patient were reviewed by me and considered in my medical decision making (see chart for details).  29 year old female presents for evaluation of chest tightness and shortness of breath x1 week.  She is afebrile, nonseptic, non-ill-appearing.  She is without tachycardia, tachypnea or hypoxia.  She is PERC negative.  No evidence of DVT on exam.  She does have very minimal expiratory wheeze however is no acute respiratory distress.  Speaks in full sentences without difficulty.  Apparently seen by PCP and was sent here to r/o PE, symptomatic anemia.  Plan on labs, imaging and reassess.  Labs and imaging personally reviewed and assess Pregnancy negative CBC without leukocytosis, hemoglobin 9.3 which is at her baseline Metabolic panel with electrolyte, renal or liver normality Chest x-ray without infiltrates, cardiomegaly, pulmonary edema, pneumothorax EKG without ST, T changes.  No Brugada,  prolonged QT. Trop negative, did not feel any delta troponin at this time given symptoms duration nature would likely be elevated if acute ACS.  Symptoms do not sound like unstable angina.  CTA chest without acute findings.  Patient reassessed.  Dates shortness of breath resolved with albuterol and Ativan.  She is ambulatory without any hypoxia, dyspnea on exertion or chest pain.  I think patient's symptoms are likely multifactorial. She has a heart score 2 for risk factors.  CTA does not show any evidence of acute PE, infectious process.  Will DC home on steroids, albuterol.   Patient is to be discharged with recommendation to follow up with PCP in regards to today's hospital visit. SOB is not likely of cardiac or pulmonary etiology d/t presentation, PERC negative, VSS, no tracheal deviation, no JVD or new murmur, RRR, breath sounds equal bilaterally, EKG without acute abnormalities, negative troponin, and negative CXR. Pt has been advised to return to the ED if CP becomes exertional, associated with diaphoresis or nausea, radiates to left jaw/arm, worsens or becomes concerning in any way. Pt appears reliable for follow up and is agreeable to discharge.    The patient has been appropriately medically screened and/or stabilized in the ED. I have low suspicion for any other emergent medical condition which would require further screening, evaluation or treatment in the ED or require inpatient management.  Patient is hemodynamically stable and in no acute distress.  Patient able to ambulate in department prior to ED.  Evaluation does not show acute pathology that would require ongoing or additional emergent interventions while in the emergency department or further inpatient treatment.  I have discussed the diagnosis with the patient and answered all questions.  Pain is been managed while in the emergency department and  patient has no further complaints prior to discharge.  Patient is comfortable with plan  discussed in room and is stable for discharge at this time.  I have discussed strict return precautions for returning to the emergency department.  Patient was encouraged to follow-up with PCP/specialist refer to at discharge.      MDM Rules/Calculators/A&P                      ELAYNAH VIRGINIA was evaluated in Emergency Department on 06/29/2019 for the symptoms described in the history of present illness. She was evaluated in the context of the global COVID-19 pandemic, which necessitated consideration that the patient might be at risk for infection with the SARS-CoV-2 virus that causes COVID-19. Institutional protocols and algorithms that pertain to the evaluation of patients at risk for COVID-19 are in a state of rapid change based on information released by regulatory bodies including the CDC and federal and state organizations. These policies and algorithms were followed during the patient's care in the ED. Final Clinical Impression(s) / ED Diagnoses Final diagnoses:  Shortness of breath  Atypical chest pain    Rx / DC Orders ED Discharge Orders         Ordered    predniSONE (DELTASONE) 50 MG tablet  Daily     06/29/19 1620           Milford Cilento A, PA-C 06/29/19 1623    Terrilee Files, MD 06/29/19 1911

## 2019-06-29 NOTE — Progress Notes (Signed)
Subjective:    Patient ID: Margaret Golden, female    DOB: [birth year], 30 y.o.   MRN: [id number]  HPI  Patient is a 29 year old female who presents today with chief complaint of chest pain and shortness of breath.  These are intermittent but worsen with exertion.  She reports that her symptoms typically resolve with rest.  She reports positive pressure chest pressure today at time of visit.  Of note we did try to reach out to her on Friday afternoon to advise her to go to the emergency department over the weekend and not to wait for today's appointment.  She states that she did get our message but chose not to go because she was afraid of leaving her cats potentially unattended.  She has a previous history of severe anemia requiring transfusion. She is maintained on an iron supplement.   LMP was 2 weeks ago- last period was heavy. She plans to have an IUD placed with her GYN to help with her heavy menstrual cycles.   Review of Systems See HPI  Past Medical History:  Diagnosis Date  . ADD (attention deficit disorder)   . Anxiety   . Depression   . Exercise-induced asthma   . Fatty liver   . History of chicken pox   . History of kidney stones   . Migraines   . RLS (restless legs syndrome)   . Thyroid disease    hypothyroid     Social History   Socioeconomic History  . Marital status: Single    Spouse name: Not on file  . Number of children: Not on file  . Years of education: 8  . Highest education level: Not on file  Occupational History  . Not on file  Tobacco Use  . Smoking status: Never Smoker  . Smokeless tobacco: Never Used  Substance and Sexual Activity  . Alcohol use: Not Currently    Alcohol/week: 0.0 standard drinks  . Drug use: No  . Sexual activity: Never    Comment: never sexually active  Other Topics Concern  . Not on file  Social History Narrative   Unemployed   Lives with Dad   Seeking work   Only child   Dog 3   Lives in a one story house  with father, has a cat-12/26/16-sjb   Social Determinants of Corporate investment banker Strain:   . Difficulty of Paying Living Expenses:   Food Insecurity:   . Worried About Programme researcher, broadcasting/film/video in the Last Year:   . Barista in the Last Year:   Transportation Needs:   . Freight forwarder (Medical):   Marland Kitchen Lack of Transportation (Non-Medical):   Physical Activity:   . Days of Exercise per Week:   . Minutes of Exercise per Session:   Stress:   . Feeling of Stress :   Social Connections:   . Frequency of Communication with Friends and Family:   . Frequency of Social Gatherings with Friends and Family:   . Attends Religious Services:   . Active Member of Clubs or Organizations:   . Attends Banker Meetings:   Marland Kitchen Marital Status:   Intimate Partner Violence:   . Fear of Current or Ex-Partner:   . Emotionally Abused:   Marland Kitchen Physically Abused:   . Sexually Abused:     Past Surgical History:  Procedure Laterality Date  . KNEE ARTHROSCOPY Right 2007  . TONSILLECTOMY  2010  . WISDOM TOOTH  EXTRACTION      Family History  Problem Relation Age of Onset  . Depression Mother   . Depression Father   . Cancer Paternal Aunt        colon  . Cancer Paternal Uncle        brain cancer  . Diabetes Maternal Grandfather   . Arthritis Maternal Grandfather   . Arthritis Maternal Grandmother   . Breast cancer Maternal Grandmother   . Arthritis Paternal Grandmother   . Arthritis Paternal Grandfather     Allergies  Allergen Reactions  . Augmentin [Amoxicillin-Pot Clavulanate]     hives    Current Outpatient Medications on File Prior to Visit  Medication Sig Dispense Refill  . albuterol (VENTOLIN HFA) 108 (90 Base) MCG/ACT inhaler Inhale 2 puffs into the lungs every 6 (six) hours as needed for wheezing or shortness of breath (Chest tightness). 18 g 0  . aspirin-acetaminophen-caffeine (EXCEDRIN MIGRAINE) 250-250-65 MG tablet Take by mouth every 6 (six) hours as needed  for headache.    . ferrous sulfate 325 (65 FE) MG tablet Take 1 tablet (325 mg total) by mouth 2 (two) times daily with a meal. 60 tablet 1  . gabapentin (NEURONTIN) 600 MG tablet Take 1 tablet (600 mg total) by mouth at bedtime. 30 tablet 4  . Iron-FA-B Cmp-C-Biot-Probiotic (FUSION PLUS) CAPS One capsule twice daily with orange juice. Stop your Ferrous sulfate. 30 capsule 1  . levothyroxine (SYNTHROID) 200 MCG tablet Take 1 tablet (200 mcg total) by mouth daily before breakfast. 90 tablet 0  . meloxicam (MOBIC) 7.5 MG tablet Take 1 tablet (7.5 mg total) by mouth daily. 14 tablet 0  . Multiple Vitamins-Minerals (MULTIVITAMIN WITH MINERALS) tablet Take 1 tablet by mouth daily.    Marland Kitchen omeprazole (PRILOSEC) 20 MG capsule Take 2 capsules (40 mg total) by mouth daily. 180 capsule 2  . sertraline (ZOLOFT) 100 MG tablet Take 2 tablets (200 mg total) by mouth daily. 60 tablet 3  . VITAMIN D PO Take by mouth.    . lamoTRIgine (LAMICTAL) 200 MG tablet Take 1 tablet (200 mg total) by mouth daily. 30 tablet 3   No current facility-administered medications on file prior to visit.    BP 127/77 (BP Location: Right Arm, Patient Position: Sitting, Cuff Size: Large)   Pulse 100   Temp 97.7 F (36.5 C) (Temporal)   Resp 16   Ht 5\' 7"  (1.702 m)   Wt (!) 379 lb (171.9 kg)   LMP 06/06/2019 (Exact Date)   SpO2 100%   BMI 59.36 kg/m       Objective:   Physical Exam Constitutional:      Appearance: She is well-developed.  Neck:     Thyroid: No thyromegaly.  Cardiovascular:     Rate and Rhythm: Normal rate and regular rhythm.     Heart sounds: Normal heart sounds. No murmur.  Pulmonary:     Effort: Pulmonary effort is normal. No respiratory distress.     Breath sounds: Normal breath sounds. No wheezing.  Musculoskeletal:     Cervical back: Neck supple.  Skin:    General: Skin is warm and dry.  Neurological:     Mental Status: She is alert and oriented to person, place, and time.  Psychiatric:          Behavior: Behavior normal.        Thought Content: Thought content normal.        Judgment: Judgment normal.  Assessment & Plan:  CP/DOE- differential includes severe anemia, cardiac etiology, PE.  I advised the pt that she would be best served to be evaluated now in the ED due to ongoing symptoms. Report given to ED Physician on duty at the [facility]. Patient verbalizes understanding and is agreeable to proceed to the ED.   EKG tracing is personally reviewed.  EKG notes NSR.  No acute changes.

## 2019-06-30 DIAGNOSIS — F4323 Adjustment disorder with mixed anxiety and depressed mood: Secondary | ICD-10-CM | POA: Diagnosis not present

## 2019-06-30 NOTE — Telephone Encounter (Signed)
Spoke with patient. Advised per Leota Sauers, CNM. Patient agreeable to proceed with PUS, will discuss Mirena IUD insertion after PUS completed, would like to do same day.   PUS and possible Mirena IUD insertion scheduled for 07/09/19 at 3pm.  Advised patient to take Motrin 800 mg with food and water one hour before procedure. Will place orders for precert, business office will call to review prior to appt. Patient verbalizes understanding and is agreeable.   LMP 06/06/19 -06/19/19, never sexually active.  [id number]  Advised patient I will review with Dr. Hyacinth Meeker and return call with any additional instructions. Dr. Hyacinth Meeker is out of the office, will f/u 07/06/19. Patient agreeable.   Order placed for PUS Order pended for IUD insertion.   Dr. Hyacinth Meeker -please clarify Dx for IUD. And advise on Cytotec Rx, if recommended.     Cc: Webb Silversmith, [place] P[place]

## 2019-07-06 NOTE — Telephone Encounter (Signed)
Cytotec pm before and am of procedure would be good.  We could also consider a [place] IUD and I will discuss that with her the day she comes for the ultrasound.  Thanks.

## 2019-07-07 DIAGNOSIS — F4323 Adjustment disorder with mixed anxiety and depressed mood: Secondary | ICD-10-CM | POA: Diagnosis not present

## 2019-07-07 MED ORDER — MISOPROSTOL 200 MCG PO TABS: ORAL_TABLET | ORAL | 0 refills | Status: DC

## 2019-07-07 NOTE — Telephone Encounter (Signed)
Left message to call Noreene Larsson, RN at [facility] [phone].   Rx for cytotec to pharmacy on file.

## 2019-07-08 ENCOUNTER — Other Ambulatory Visit: Payer: Self-pay

## 2019-07-08 NOTE — Telephone Encounter (Signed)
Call to patient, left detailed message on mobile number, ok per dpr. Advised as seen below per Dr. Hyacinth Meeker. Advised of Cytotec Rx sent to [facility] pharmacy on 07/07/19. Instructions provided. Advised to take Motrin 800 mg with food and water one hour before procedure. Instructed patient to return call to office if any additional questions/concersns.   Encounter closed.

## 2019-07-09 ENCOUNTER — Other Ambulatory Visit: Payer: Self-pay

## 2019-07-09 ENCOUNTER — Ambulatory Visit (INDEPENDENT_AMBULATORY_CARE_PROVIDER_SITE_OTHER): Payer: BC Managed Care – PPO

## 2019-07-09 ENCOUNTER — Ambulatory Visit: Payer: BC Managed Care – PPO | Admitting: Obstetrics & Gynecology

## 2019-07-09 ENCOUNTER — Encounter: Payer: Self-pay | Admitting: Obstetrics & Gynecology

## 2019-07-09 VITALS — BP 120/78 | HR 70 | Temp 97.2°F | Resp 16 | Wt 372.0 lb

## 2019-07-09 DIAGNOSIS — D500 Iron deficiency anemia secondary to blood loss (chronic): Secondary | ICD-10-CM | POA: Diagnosis not present

## 2019-07-09 DIAGNOSIS — N946 Dysmenorrhea, unspecified: Secondary | ICD-10-CM | POA: Diagnosis not present

## 2019-07-09 DIAGNOSIS — Z30430 Encounter for insertion of intrauterine contraceptive device: Secondary | ICD-10-CM | POA: Diagnosis not present

## 2019-07-09 DIAGNOSIS — N857 Hematometra: Secondary | ICD-10-CM

## 2019-07-09 DIAGNOSIS — Z862 Personal history of diseases of the blood and blood-forming organs and certain disorders involving the immune mechanism: Secondary | ICD-10-CM | POA: Diagnosis not present

## 2019-07-09 DIAGNOSIS — N920 Excessive and frequent menstruation with regular cycle: Secondary | ICD-10-CM

## 2019-07-09 NOTE — Progress Notes (Signed)
29 y.o. G0P0000 Single White or Caucasian female here for pelvic ultrasound due to history of menorrhagia with regular cycles.  Patient has been seen in the past by Margaret Golden, CNM, who has recent retired and her care has been transferred to me.  Margaret Golden and patient have discussed treatment of her menorrhagia with IUD.  Patient may want this done today as well.  Patient's last menstrual period was 07/09/2019 (exact date).  Contraception: Abstinence  Findings:  UTERUS: 7.1 x 3.9 x 3.5 cm EMS: 6.3 mm, presence of approximately 4 cm hematometra is present, endometrium can be measured separately from this. ADNEXA: Left ovary: 2.5 x 2.2 x 1.7 cm       Right ovary: 3.1 x 2.2 x 2.2 cm CUL DE SAC: No free fluid  Discussion: Ultrasonographer supervised.  Images reviewed with patient.  Findings discussed.  Due to the presence of the hematometra, feel the patient would benefit from hysteroscopy with D&C to see if there is any structural cause of this.  IUD can be placed at the same time with possible ultrasound guidance.  We could also do office D&C with a rocket curette to try and remove the fluid and then proceed with IUD placement.  Both of these options were discussed in detail with the patient.  As she is currently abstinent is never been pregnant she is anxious about doing procedure in the office that would involve evacuation of a myometrium the endometrium.  She is in favor of proceeding in the OR.  Risks and benefits were discussed.  Specifically bleeding, infection, uterine perforation and issues with IUD placement were all reviewed.  She would like to know cost before proceeding.  Assessment: Menorrhagia BMI 58 Hematometra Iron deficiency anemia  Plan: We will plan to proceed with hysteroscopy with a D&C in the OR and then IUD placement with [facility] IUD.  After review of ultrasound images, options for treatment were discussed including risks and benefits.  About 30 minutes spend with pt in  discussion of options, reviewing risks/benefits.

## 2019-07-14 DIAGNOSIS — F4323 Adjustment disorder with mixed anxiety and depressed mood: Secondary | ICD-10-CM | POA: Diagnosis not present

## 2019-07-15 DIAGNOSIS — N857 Hematometra: Secondary | ICD-10-CM | POA: Insufficient documentation

## 2019-07-15 DIAGNOSIS — N920 Excessive and frequent menstruation with regular cycle: Secondary | ICD-10-CM | POA: Insufficient documentation

## 2019-07-20 ENCOUNTER — Telehealth: Payer: Self-pay | Admitting: Obstetrics & Gynecology

## 2019-07-20 ENCOUNTER — Ambulatory Visit: Payer: BC Managed Care – PPO | Attending: Internal Medicine

## 2019-07-20 ENCOUNTER — Other Ambulatory Visit: Payer: Self-pay

## 2019-07-20 DIAGNOSIS — Z23 Encounter for immunization: Secondary | ICD-10-CM

## 2019-07-20 NOTE — Telephone Encounter (Signed)
Spoke with patient regarding surgery benefits. Patient acknowledges understanding of information presented. Patient is aware that benefits presented are for professional benefits only. Patient is aware that once surgery is scheduled, the hospital will call with separate benefits. Patient is aware of surgery cancellation policy. ° °Patient is ready to proceed with scheduling. °

## 2019-07-20 NOTE — Telephone Encounter (Signed)
Left message to call Aadya Kindler at [phone]. 

## 2019-07-20 NOTE — Progress Notes (Signed)
Erroneous encounter

## 2019-07-20 NOTE — Progress Notes (Signed)
   Covid-19 Vaccination Clinic  Name:  Margaret Golden    MRN: [id number] DOB: 1990-04-04  07/20/2019  Margaret Golden was observed post Covid-19 immunization for 15 minutes without incident. She was provided with Vaccine Information Sheet and instruction to access the V-Safe system.   Margaret Golden was instructed to call 911 with any severe reactions post vaccine: Marland Kitchen Difficulty breathing  . Swelling of face and throat  . A fast heartbeat  . A bad rash all over body  . Dizziness and weakness   Immunizations Administered    Name Date Dose VIS Date Route   Pfizer COVID-19 Vaccine 07/20/2019  1:15 PM 0.3 mL 05/27/2018 Intramuscular   Manufacturer: [employer], [employer]   Lot: [id number]   NDC: [address]-5

## 2019-07-20 NOTE — Telephone Encounter (Signed)
Spoke with patient. Patient states that she would like to proceed with surgery on 08/17/2019. Surgery scheduled for 08/17/2019 at 0730 at [facility] OR. Patient is agreeable to date and time. Pre op scheduled for 07/30/2019 at 3 pm with Dr.Miller. COVID test scheduled for 08/13/2019 at 2 pm at [facility] location. Patient is aware of the need to quarantine after test until surgery. 2 week post op scheduled for 09/01/2019 at 3:30 pm with Dr.Miller. Patient is agreeable to date and time. Surgery instructions reviewed and to Joy's desk to give to patient at pre op.  Routing to provider and will close encounter.

## 2019-07-21 ENCOUNTER — Ambulatory Visit (INDEPENDENT_AMBULATORY_CARE_PROVIDER_SITE_OTHER): Payer: BC Managed Care – PPO | Admitting: Family

## 2019-07-21 ENCOUNTER — Telehealth: Payer: Self-pay | Admitting: Family

## 2019-07-21 ENCOUNTER — Other Ambulatory Visit: Payer: Self-pay

## 2019-07-21 ENCOUNTER — Encounter: Payer: Self-pay | Admitting: Family

## 2019-07-21 VITALS — BP 137/73 | HR 107 | Temp 97.9°F | Resp 16 | Ht 69.0 in | Wt 375.0 lb

## 2019-07-21 DIAGNOSIS — E039 Hypothyroidism, unspecified: Secondary | ICD-10-CM

## 2019-07-21 DIAGNOSIS — F4323 Adjustment disorder with mixed anxiety and depressed mood: Secondary | ICD-10-CM | POA: Diagnosis not present

## 2019-07-21 DIAGNOSIS — Z0000 Encounter for general adult medical examination without abnormal findings: Secondary | ICD-10-CM | POA: Diagnosis not present

## 2019-07-21 DIAGNOSIS — D509 Iron deficiency anemia, unspecified: Secondary | ICD-10-CM | POA: Diagnosis not present

## 2019-07-21 LAB — HEPATIC FUNCTION PANEL
ALT: 25 U/L (ref 0–35)
AST: 43 U/L — ABNORMAL HIGH (ref 0–37)
Albumin: 4.2 g/dL (ref 3.5–5.2)
Alkaline Phosphatase: 76 U/L (ref 39–117)
Bilirubin, Direct: 0.1 mg/dL (ref 0.0–0.3)
Total Bilirubin: 0.5 mg/dL (ref 0.2–1.2)
Total Protein: 7 g/dL (ref 6.0–8.3)

## 2019-07-21 LAB — CBC WITH DIFFERENTIAL/PLATELET
Basophils Absolute: 0.1 10*3/uL (ref 0.0–0.1)
Basophils Relative: 1.1 % (ref 0.0–3.0)
Eosinophils Absolute: 0.1 10*3/uL (ref 0.0–0.7)
Eosinophils Relative: 2.4 % (ref 0.0–5.0)
HCT: 33.8 % — ABNORMAL LOW (ref 36.0–46.0)
Hemoglobin: 10.2 g/dL — ABNORMAL LOW (ref 12.0–15.0)
Lymphocytes Relative: 28.4 % (ref 12.0–46.0)
Lymphs Abs: 1.6 10*3/uL (ref 0.7–4.0)
MCHC: 30.3 g/dL (ref 30.0–36.0)
MCV: 73.8 fl — ABNORMAL LOW (ref 78.0–100.0)
Monocytes Absolute: 0.4 10*3/uL (ref 0.1–1.0)
Monocytes Relative: 7.4 % (ref 3.0–12.0)
Neutro Abs: 3.4 10*3/uL (ref 1.4–7.7)
Neutrophils Relative %: 60.7 % (ref 43.0–77.0)
Platelets: 201 10*3/uL (ref 150.0–400.0)
RBC: 4.58 Mil/uL (ref 3.87–5.11)
RDW: 18 % — ABNORMAL HIGH (ref 11.5–15.5)
WBC: 5.6 10*3/uL (ref 4.0–10.5)

## 2019-07-21 LAB — LIPID PANEL
Cholesterol: 150 mg/dL (ref 0–200)
HDL: 35.5 mg/dL — ABNORMAL LOW (ref >39.00)
LDL Cholesterol: 78 mg/dL (ref 0–99)
NonHDL: 114.93
Total CHOL/HDL Ratio: 4
Triglycerides: 183 mg/dL — ABNORMAL HIGH (ref 0.0–149.0)
VLDL: 36.6 mg/dL (ref 0.0–40.0)

## 2019-07-21 LAB — TSH: TSH: 5.36 u[IU]/mL — ABNORMAL HIGH (ref 0.35–4.50)

## 2019-07-21 NOTE — Patient Instructions (Addendum)
Please call cardiology to schedule your consultation appointment.  [phone] Schedule a routine eye exam.  Try to work on healthy diet.

## 2019-07-21 NOTE — Progress Notes (Signed)
Subjective:    Patient ID: Margaret Golden, female    DOB: Sep 12, 1990, 29 y.o.   MRN: [id number]  HPI  Patient presents today for complete physical.  Immunizations: complete pfizer series yesterday. Tetanus up to date Diet: reports appetite is "up and down." Living on her own and trying to eat healthier.  Wt Readings from Last 3 Encounters:  07/21/19 (!) 375 lb (170.1 kg)  07/09/19 (!) 372 lb (168.7 kg)  06/29/19 (!) 379 lb (171.9 kg)  Exercise: not exercising Pap Smear: 05/19/19 Vision:  due Dental: up to date  She reports that she continues to have SOB/chest tightness and it occurs with exertion.  Lab Results  Component Value Date   WBC 9.0 06/29/2019   HGB 9.3 (L) 06/29/2019   HCT 31.9 (L) 06/29/2019   MCV 78.6 (L) 06/29/2019   PLT 205 06/29/2019   Migraine every other week.  She uses excedrin migraine which helps  Review of Systems  Constitutional: Negative for unexpected weight change.  HENT: Negative for hearing loss and rhinorrhea.   Eyes: Negative for visual disturbance.  Respiratory: Negative for cough (mild allergy cough).   Cardiovascular:       See HPI  Gastrointestinal: Negative for diarrhea, nausea and vomiting.  Genitourinary: Positive for menstrual problem (heavy cycles, scheduled for mirena). Negative for dysuria and frequency.  Musculoskeletal: Negative for arthralgias (just mild left ankle pain on occasion) and myalgias.  Skin: Negative for rash.  Neurological: Positive for headaches.  Hematological: Negative for adenopathy.  Psychiatric/Behavioral:       Mood is "up and down."  Attributes this to living on her own. Mood is improving.    Past Medical History:  Diagnosis Date  . ADD (attention deficit disorder)   . Anxiety   . Depression   . Exercise-induced asthma   . Fatty liver   . History of chicken pox   . History of kidney stones   . Migraines   . Renal disorder    kidney stones  . RLS (restless legs syndrome)   . Thyroid disease      hypothyroid     Social History   Socioeconomic History  . Marital status: Single    Spouse name: Not on file  . Number of children: Not on file  . Years of education: 79  . Highest education level: Not on file  Occupational History  . Not on file  Tobacco Use  . Smoking status: Never Smoker  . Smokeless tobacco: Never Used  Substance and Sexual Activity  . Alcohol use: Not Currently    Alcohol/week: 0.0 standard drinks  . Drug use: No  . Sexual activity: Never    Comment: never sexually active  Other Topics Concern  . Not on file  Social History Narrative   Unemployed   Lives with Dad   Seeking work   Only child   Dog 3   Lives in a one story house with father, has a cat-12/26/16-sjb   Social Determinants of Corporate investment banker Strain:   . Difficulty of Paying Living Expenses:   Food Insecurity:   . Worried About Programme researcher, broadcasting/film/video in the Last Year:   . Barista in the Last Year:   Transportation Needs:   . Freight forwarder (Medical):   Marland Kitchen Lack of Transportation (Non-Medical):   Physical Activity:   . Days of Exercise per Week:   . Minutes of Exercise per Session:   Stress:   .  Feeling of Stress :   Social Connections:   . Frequency of Communication with Friends and Family:   . Frequency of Social Gatherings with Friends and Family:   . Attends Religious Services:   . Active Member of Clubs or Organizations:   . Attends Banker Meetings:   Marland Kitchen Marital Status:   Intimate Partner Violence:   . Fear of Current or Ex-Partner:   . Emotionally Abused:   Marland Kitchen Physically Abused:   . Sexually Abused:     Past Surgical History:  Procedure Laterality Date  . KNEE ARTHROSCOPY Right 2007  . TONSILLECTOMY  2010  . WISDOM TOOTH EXTRACTION      Family History  Problem Relation Age of Onset  . Depression Mother   . Depression Father   . Cancer Paternal Aunt        colon  . Cancer Paternal Uncle        brain cancer  . Diabetes  Maternal Grandfather   . Arthritis Maternal Grandfather   . Arthritis Maternal Grandmother   . Breast cancer Maternal Grandmother   . Arthritis Paternal Grandmother   . Arthritis Paternal Grandfather     Allergies  Allergen Reactions  . Augmentin [Amoxicillin-Pot Clavulanate]     hives    Current Outpatient Medications on File Prior to Visit  Medication Sig Dispense Refill  . albuterol (VENTOLIN HFA) 108 (90 Base) MCG/ACT inhaler Inhale 2 puffs into the lungs every 6 (six) hours as needed for wheezing or shortness of breath (Chest tightness). 18 g 0  . aspirin-acetaminophen-caffeine (EXCEDRIN MIGRAINE) 250-250-65 MG tablet Take by mouth every 6 (six) hours as needed for headache.    . ferrous sulfate 325 (65 FE) MG tablet Take 1 tablet (325 mg total) by mouth 2 (two) times daily with a meal. 60 tablet 1  . gabapentin (NEURONTIN) 600 MG tablet Take 1 tablet (600 mg total) by mouth at bedtime. 30 tablet 4  . Iron-FA-B Cmp-C-Biot-Probiotic (FUSION PLUS) CAPS One capsule twice daily with orange juice. Stop your Ferrous sulfate. 30 capsule 1  . levothyroxine (SYNTHROID) 200 MCG tablet Take 1 tablet (200 mcg total) by mouth daily before breakfast. 90 tablet 0  . meloxicam (MOBIC) 7.5 MG tablet Take 1 tablet (7.5 mg total) by mouth daily. 14 tablet 0  . Multiple Vitamins-Minerals (MULTIVITAMIN WITH MINERALS) tablet Take 1 tablet by mouth daily.    Marland Kitchen omeprazole (PRILOSEC) 20 MG capsule Take 2 capsules (40 mg total) by mouth daily. 180 capsule 2  . sertraline (ZOLOFT) 100 MG tablet Take 2 tablets (200 mg total) by mouth daily. 60 tablet 3  . VITAMIN D PO Take by mouth.    . lamoTRIgine (LAMICTAL) 200 MG tablet Take 1 tablet (200 mg total) by mouth daily. 30 tablet 3   No current facility-administered medications on file prior to visit.    BP 137/73 (BP Location: Right Arm, Patient Position: Sitting, Cuff Size: Large)   Pulse (!) 107   Temp 97.9 F (36.6 C) (Temporal)   Resp 16   Ht 5\' 9"   (1.753 m)   Wt (!) 375 lb (170.1 kg)   LMP 07/09/2019 (Exact Date)   SpO2 100%   BMI 55.38 kg/m       Objective:   Physical Exam  Physical Exam  Constitutional: She is oriented to person, place, and time. She appears well-developed and well-nourished. No distress.  HENT:  Head: Normocephalic and atraumatic.  Right Ear: Tympanic membrane and ear canal normal.  Left Ear: Tympanic membrane and ear canal normal.  Mouth/Throat: Not examined- pt wearing mask Eyes: Pupils are equal, round, and reactive to light. No scleral icterus.  Neck: Normal range of motion. No thyromegaly present.  Cardiovascular: Normal rate and regular rhythm.   No murmur heard. Pulmonary/Chest: Effort normal and breath sounds normal. No respiratory distress. He has no wheezes. She has no rales. She exhibits no tenderness.  Abdominal: Soft. Bowel sounds are normal. She exhibits no distension and no mass. There is no tenderness. There is no rebound and no guarding.  Musculoskeletal: She exhibits no edema.  Lymphadenopathy:    She has no cervical adenopathy.  Neurological: She is alert and oriented to person, place, and time. She has normal patellar reflexes. She exhibits normal muscle tone. Coordination normal.  Skin: Skin is warm and dry.  Psychiatric: She has a normal mood and affect. Her behavior is normal. Judgment and thought content normal.  Breast/pelvic: deferred         Assessment & Plan:   Chest pain/SOB- most likely related to anemia which hopefully will improve following planned insertion of Mirena IUD.  We did place a referral to cardiology a few months back and she failed to schedule. I have given her the number and advised her to call their office to schedule consultation.  Preventative care- discussed diet, weight loss. She is unable to exercise currently due to sob. I have placed a referral to the medical weight loss clinic.  Pap up to date. Labs per orders.   Iron Deficiency anemia-obtain  follow up cbc.   Hypothyroid- obtain follow up tsh.  Continue synthroid.   This visit occurred during the SARS-CoV-2 public health emergency.  Safety protocols were in place, including screening questions prior to the visit, additional usage of staff PPE, and extensive cleaning of exam room while observing appropriate contact time as indicated for disinfecting solutions.         Assessment & Plan:

## 2019-07-21 NOTE — Telephone Encounter (Signed)
See mychart.  

## 2019-07-23 ENCOUNTER — Telehealth: Payer: Self-pay | Admitting: Family

## 2019-07-23 MED ORDER — LEVOTHYROXINE SODIUM 112 MCG PO TABS: 112.0000 ug | ORAL_TABLET | Freq: Every day | ORAL | 0 refills | Status: DC

## 2019-07-23 MED ORDER — LEVOTHYROXINE SODIUM 100 MCG PO TABS: 100.0000 ug | ORAL_TABLET | Freq: Every day | ORAL | 0 refills | Status: DC

## 2019-07-23 NOTE — Telephone Encounter (Signed)
Pt.notified

## 2019-07-23 NOTE — Telephone Encounter (Signed)
Thyroid test is improving but we still need to adjust synthroid. D/c synthroid , start synthroid + synthroid 112 mcg for total of 212 mcg. Repeat tsh in 6 weeks.   Anemia is slightly improved.

## 2019-07-27 ENCOUNTER — Other Ambulatory Visit: Payer: Self-pay | Admitting: Obstetrics & Gynecology

## 2019-07-27 ENCOUNTER — Other Ambulatory Visit: Payer: BC Managed Care – PPO

## 2019-07-27 DIAGNOSIS — F4323 Adjustment disorder with mixed anxiety and depressed mood: Secondary | ICD-10-CM | POA: Diagnosis not present

## 2019-07-27 DIAGNOSIS — E669 Obesity, unspecified: Secondary | ICD-10-CM

## 2019-07-30 ENCOUNTER — Other Ambulatory Visit: Payer: Self-pay

## 2019-07-30 ENCOUNTER — Encounter: Payer: Self-pay | Admitting: Obstetrics & Gynecology

## 2019-07-30 ENCOUNTER — Ambulatory Visit (INDEPENDENT_AMBULATORY_CARE_PROVIDER_SITE_OTHER): Payer: BC Managed Care – PPO | Admitting: Obstetrics & Gynecology

## 2019-07-30 VITALS — BP 110/80 | HR 70 | Temp 97.5°F | Resp 16 | Wt 371.0 lb

## 2019-07-30 DIAGNOSIS — E781 Pure hyperglyceridemia: Secondary | ICD-10-CM

## 2019-07-30 DIAGNOSIS — N857 Hematometra: Secondary | ICD-10-CM

## 2019-07-30 DIAGNOSIS — N920 Excessive and frequent menstruation with regular cycle: Secondary | ICD-10-CM

## 2019-07-30 DIAGNOSIS — D500 Iron deficiency anemia secondary to blood loss (chronic): Secondary | ICD-10-CM

## 2019-07-30 NOTE — Progress Notes (Signed)
29 y.o. G0P0000 Single White or Caucasian female here for discussion of upcoming procedure.  Hysteroscopy with D&C and Kyleena IUD placement is planned due to hemotametra.  Pre-op evaluation thus far has included ultrasound on 4 11/2019 that showed a uterus measuring 7 x 4 x 3.5 cm with endometrium of 6.3 mm and about a 4 cm hematometra.  She has history of menorrhagia and was started desirous of IUD placement.  Due to the menometrorrhagia hysteroscopy with D&C and then ultrasound-guided placement of the IUD was recommended.  Surgery is already scheduled but the patient cannot proceed as her transportation to and on the hospital is not can be available until mid June.  She wants to know if she can reschedule this procedure which of course will be needed.  She is aware she cannot drive for 24 hours after procedure receiving anesthesia.  As procedure will eventually be done we did review the risks and benefits and alternatives of the procedure.  She does not want to be on oral contraceptives at this time.  Recovery and pain management discussed.  Risks discussed including but not limited to bleeding, rare risk of transfusion, infection, 1% risk of uterine perforation with risks of fluid deficit causing cardiac arrythmia, cerebral swelling and/or need to stop procedure early.  Fluid emboli and rare risk of death discussed.  DVT/PE, rare risk of risk of bowel/bladder/ureteral/vascular injury.  Patient aware if pathology abnormal she may need additional treatment.  All questions answered.       Patient did have recent hemoglobin which is improved by about one-point.  It is 10.2 and was 9.3.  She still taking her iron.  Ob Hx:   Patient's last menstrual period was 07/18/2019.          Sexually active: No. Birth control: abstinence Last pap: 05/19/2019 Last MMG: n/a Tobacco: non smoker  Past Surgical History:  Procedure Laterality Date  . KNEE ARTHROSCOPY Right 2007  . TONSILLECTOMY  2010  . WISDOM TOOTH  EXTRACTION      Past Medical History:  Diagnosis Date  . ADD (attention deficit disorder)   . Anxiety   . Depression   . Exercise-induced asthma   . Fatty liver   . History of chicken pox   . History of kidney stones   . Migraines   . Renal disorder    kidney stones  . RLS (restless legs syndrome)   . Thyroid disease    hypothyroid    Allergies: Augmentin [amoxicillin-pot clavulanate]  Current Outpatient Medications  Medication Sig Dispense Refill  . albuterol (VENTOLIN HFA) 108 (90 Base) MCG/ACT inhaler Inhale 2 puffs into the lungs every 6 (six) hours as needed for wheezing or shortness of breath (Chest tightness). 18 g 0  . aspirin-acetaminophen-caffeine (EXCEDRIN MIGRAINE) 245-809-98 MG tablet Take by mouth every 6 (six) hours as needed for headache.    . gabapentin (NEURONTIN) 600 MG tablet Take 1 tablet (600 mg total) by mouth at bedtime. 30 tablet 4  . Iron-FA-B Cmp-C-Biot-Probiotic (FUSION PLUS) CAPS One capsule twice daily with orange juice. Stop your Ferrous sulfate. 30 capsule 1  . lamoTRIgine (LAMICTAL) 200 MG tablet Take 1 tablet (200 mg total) by mouth daily. 30 tablet 3  . levothyroxine (SYNTHROID) 100 MCG tablet Take 1 tablet (100 mcg total) by mouth daily. 90 tablet 0  . levothyroxine (SYNTHROID) 112 MCG tablet Take 1 tablet (112 mcg total) by mouth daily. 90 tablet 0  . meloxicam (MOBIC) 7.5 MG tablet Take 1 tablet (7.5  mg total) by mouth daily. 14 tablet 0  . Multiple Vitamins-Minerals (MULTIVITAMIN WITH MINERALS) tablet Take 1 tablet by mouth daily.    Marland Kitchen omeprazole (PRILOSEC) 20 MG capsule Take 2 capsules (40 mg total) by mouth daily. 180 capsule 2  . sertraline (ZOLOFT) 100 MG tablet Take 2 tablets (200 mg total) by mouth daily. 60 tablet 3  . VITAMIN D PO Take by mouth.     No current facility-administered medications for this visit.    ROS: Pertinent items noted in HPI and remainder of comprehensive ROS otherwise negative.  Exam:    BP 110/80    Pulse 70   Temp (!) 97.5 F (36.4 C) (Skin)   Resp 16   Wt (!) 371 lb (168.3 kg)   LMP 07/18/2019   BMI 54.79 kg/m   General appearance: alert and cooperative Head: Normocephalic, without obvious abnormality, atraumatic Neck: no adenopathy, supple, symmetrical, trachea midline and thyroid not enlarged, symmetric, no tenderness/mass/nodules Lungs: clear to auscultation bilaterally Heart: regular rate and rhythm, S1, S2 normal, no murmur, click, rub or gallop Abdomen: soft, non-tender; bowel sounds normal; no masses,  no organomegaly Extremities: extremities normal, atraumatic, no cyanosis or edema Skin: Skin color, texture, turgor normal. No rashes or lesions Lymph nodes: Cervical, supraclavicular, and axillary nodes normal. no inguinal nodes palpated Neurologic: Grossly normal  Pelvic: External genitalia:  no lesions              Urethra: normal appearing urethra with no masses, tenderness or lesions              Bartholins and Skenes: Bartholin's, Urethra, Skene's normal                 Vagina: normal appearing vagina with normal color and discharge, no lesions              Cervix: normal appearance              Pap taken: No.        Bimanual Exam:  Uterus:  uterus is normal size, shape, consistency and nontender                                      Adnexa:    normal adnexa in size, nontender and no masses                                      Rectovaginal: Deferred                                      Anus:  defer exam  Chaperone, Zenovia Jordan, CMA, present for examination.   A: History of menorrhagia with resulting anemia, currently on iron Hematometra noted on ultrasound Morbid obesity with BMI 58 Hypothyroidism Elevated triglycerides on recent labs     P: Hysteroscopy with D&C and then ultrasound-guided placement of Kyleena IUD is planned.  Surgery date will need to be changed Postoperative pain management reviewed Medications/Vitamins reviewed.  Pt knows needs to  stop Excedrin Migraine least 5 days before surgery. Hemoglobin A1c obtained today

## 2019-07-31 LAB — HEMOGLOBIN A1C
Est. average glucose Bld gHb Est-mCnc: 97 mg/dL
Hgb A1c MFr Bld: 5 % (ref 4.8–5.6)

## 2019-08-03 ENCOUNTER — Telehealth: Payer: Self-pay

## 2019-08-03 DIAGNOSIS — F4323 Adjustment disorder with mixed anxiety and depressed mood: Secondary | ICD-10-CM | POA: Diagnosis not present

## 2019-08-03 NOTE — Telephone Encounter (Signed)
Left message to call Margaret Golden at [phone].  Need to discuss plans for surgery on 08/17/2019.

## 2019-08-04 ENCOUNTER — Other Ambulatory Visit: Payer: Self-pay

## 2019-08-05 NOTE — Telephone Encounter (Signed)
Patient is calling to confirm surgery for 08/17/19. Patient states she has a way to get to the surgery and no longer needs to reschedule it.

## 2019-08-06 ENCOUNTER — Other Ambulatory Visit (HOSPITAL_COMMUNITY): Payer: Self-pay

## 2019-08-06 NOTE — Telephone Encounter (Signed)
Spoke with patient. Patient has transportation to surgery and does not wish to reschedule.  Routing to provider and will close encounter.

## 2019-08-11 ENCOUNTER — Encounter (HOSPITAL_COMMUNITY): Payer: Self-pay

## 2019-08-11 DIAGNOSIS — F4323 Adjustment disorder with mixed anxiety and depressed mood: Secondary | ICD-10-CM | POA: Diagnosis not present

## 2019-08-11 NOTE — Progress Notes (Signed)
[facility] - [place], [place] - [address] [place] [address] Phone: [phone] Fax: [phone]      Your procedure is scheduled on Aug 17, 2019.  Report to [facility] Main Entrance "A" at 5:30 A.M., and check in at the Admitting office.  Call this number if you have problems the morning of surgery:  [phone]  Call [phone] if you have any questions prior to your surgery date Monday-Friday 8am-4pm    Remember:  Do not eat or drink after midnight the night before your surgery  You may drink clear liquids until 4:30 the morning of your surgery.   Clear liquids allowed are: Water, Non-Citrus Juices (without pulp), Carbonated Beverages, Clear Tea, Black Coffee Only, and Gatorade    Take these medicines the morning of surgery with A SIP OF WATER: levothyroxine (SYNTHROID) omeprazole (PRILOSEC) sertraline (ZOLOFT)   As of today, STOP taking any Aspirin (unless otherwise instructed by your surgeon) and Aspirin containing products, Aleve, Naproxen, Ibuprofen, Motrin, Advil, Goody's, BC's, all herbal medications, fish oil, and all vitamins.                      Do not wear jewelry, make up, or nail polish            Do not wear lotions, powders, perfumes or deodorant.            Do not shave 48 hours prior to surgery.              Do not bring valuables to the hospital.            [facility] is not responsible for any belongings or valuables.  Do NOT Smoke (Tobacco/Vapping) or drink Alcohol 24 hours prior to your procedure If you use a CPAP at night, you may bring all equipment for your overnight stay.   Contacts, glasses, dentures or bridgework may not be worn into surgery.      For patients admitted to the hospital, discharge time will be determined by your treatment team.   Patients discharged the day of surgery will not be allowed to drive home, and someone needs to stay with them for 24  hours.    Special instructions:   [facility]- Preparing For Surgery  Before surgery, you can play an important role. Because skin is not sterile, your skin needs to be as free of germs as possible. You can reduce the number of germs on your skin by washing with CHG (chlorahexidine gluconate) Soap before surgery.  CHG is an antiseptic cleaner which kills germs and bonds with the skin to continue killing germs even after washing.    Oral Hygiene is also important to reduce your risk of infection.  Remember - BRUSH YOUR TEETH THE MORNING OF SURGERY WITH YOUR REGULAR TOOTHPASTE  Please do not use if you have an allergy to CHG or antibacterial soaps. If your skin becomes reddened/irritated stop using the CHG.  Do not shave (including legs and underarms) for at least 48 hours prior to first CHG shower. It is OK to shave your face.  Please follow these instructions carefully.   1. Shower the NIGHT BEFORE SURGERY and the MORNING OF SURGERY with CHG Soap.   2. If you chose to wash your hair, wash your hair first as usual with your normal shampoo.  3. After you shampoo, rinse your hair and body thoroughly  to remove the shampoo.  4. Use CHG as you would any other liquid soap. You can apply CHG directly to the skin and wash gently with a scrungie or a clean washcloth.   5. Apply the CHG Soap to your body ONLY FROM THE NECK DOWN.  Do not use on open wounds or open sores. Avoid contact with your eyes, ears, mouth and genitals (private parts). Wash Face and genitals (private parts)  with your normal soap.   6. Wash thoroughly, paying special attention to the area where your surgery will be performed.  7. Thoroughly rinse your body with warm water from the neck down.  8. DO NOT shower/wash with your normal soap after using and rinsing off the CHG Soap.  9. Pat yourself dry with a CLEAN TOWEL.  10. Wear CLEAN PAJAMAS to bed the night before surgery, wear comfortable clothes the morning of  surgery  11. Place CLEAN SHEETS on your bed the night of your first shower and DO NOT SLEEP WITH PETS.   Day of Surgery:   Do not apply any deodorants/lotions.  Please wear clean clothes to the hospital/surgery center.   Remember to brush your teeth WITH YOUR REGULAR TOOTHPASTE.   Please read over the following fact sheets that you were given.

## 2019-08-12 ENCOUNTER — Encounter (HOSPITAL_COMMUNITY)
Admission: RE | Admit: 2019-08-12 | Discharge: 2019-08-12 | Disposition: A | Discharge status: Home / Self Care | Payer: BC Managed Care – PPO | Source: Ambulatory Visit | Attending: Obstetrics & Gynecology | Admitting: Obstetrics & Gynecology

## 2019-08-12 ENCOUNTER — Encounter (HOSPITAL_COMMUNITY): Payer: Self-pay

## 2019-08-12 ENCOUNTER — Encounter (HOSPITAL_COMMUNITY): Payer: Self-pay | Admitting: Vascular Surgery

## 2019-08-12 ENCOUNTER — Other Ambulatory Visit: Payer: Self-pay

## 2019-08-12 DIAGNOSIS — Z01812 Encounter for preprocedural laboratory examination: Secondary | ICD-10-CM | POA: Diagnosis not present

## 2019-08-12 HISTORY — DX: Personal history of other medical treatment: Z92.89

## 2019-08-12 HISTORY — DX: Hypothyroidism, unspecified: E03.9

## 2019-08-12 HISTORY — DX: Pneumonia, unspecified organism: J18.9

## 2019-08-12 HISTORY — DX: Anemia, unspecified: D64.9

## 2019-08-12 LAB — COMPREHENSIVE METABOLIC PANEL
ALT: 27 U/L (ref 0–44)
AST: 43 U/L — ABNORMAL HIGH (ref 15–41)
Albumin: 3.9 g/dL (ref 3.5–5.0)
Alkaline Phosphatase: 75 U/L (ref 38–126)
Anion gap: 12 (ref 5–15)
BUN: 8 mg/dL (ref 6–20)
CO2: 21 mmol/L — ABNORMAL LOW (ref 22–32)
Calcium: 9 mg/dL (ref 8.9–10.3)
Chloride: 104 mmol/L (ref 98–111)
Creatinine, Ser: 0.95 mg/dL (ref 0.44–1.00)
GFR calc Af Amer: >60 mL/min (ref >60)
GFR calc non Af Amer: >60 mL/min (ref >60)
Glucose, Bld: 109 mg/dL — ABNORMAL HIGH (ref 70–99)
Potassium: 3.7 mmol/L (ref 3.5–5.1)
Sodium: 137 mmol/L (ref 135–145)
Total Bilirubin: 0.7 mg/dL (ref 0.3–1.2)
Total Protein: 7.8 g/dL (ref 6.5–8.1)

## 2019-08-12 LAB — CBC
HCT: 38.3 % (ref 36.0–46.0)
Hemoglobin: 10.6 g/dL — ABNORMAL LOW (ref 12.0–15.0)
MCH: 21 pg — ABNORMAL LOW (ref 26.0–34.0)
MCHC: 27.7 g/dL — ABNORMAL LOW (ref 30.0–36.0)
MCV: 75.8 fL — ABNORMAL LOW (ref 80.0–100.0)
Platelets: 249 10*3/uL (ref 150–400)
RBC: 5.05 MIL/uL (ref 3.87–5.11)
RDW: 15.1 % (ref 11.5–15.5)
WBC: 10.3 10*3/uL (ref 4.0–10.5)
nRBC: 0 % (ref 0.0–0.2)

## 2019-08-12 NOTE — Progress Notes (Signed)
PCP - [name] NP @ [facility] Cardiologist - na   Chest x-ray - 3/21 EKG - 3/21 Stress Test - na ECHO - 8/17 Cardiac Cath - na  Sleep Study - 2017 CPAP - na    Blood Thinner Instructions: na Aspirin Instructions:  ERAS Protcol -yes PRE-SURGERY Ensure-none ordered  COVID TEST- 08/13/19   Anesthesia review: sob over past months  Patient denies fever, cough and chest pain at PAT appointment   All instructions explained to the patient, with a verbal understanding of the material. Patient agrees to go over the instructions while at home for a better understanding. Patient also instructed to self quarantine after being tested for COVID-19. The opportunity to ask questions was provided.

## 2019-08-12 NOTE — Progress Notes (Signed)
Anesthesia Note:  Case: [id number] Date/Time: 08/17/19 0714   Procedures:      DILATATION & CURETTAGE/HYSTEROSCOPY WITH MYOSURE (N/A )     INTRAUTERINE DEVICE (IUD) INSERTION/ KYLEENA IUD (N/A ) - Kyleena IUD   Anesthesia type: Choice   Pre-op diagnosis: Hematometra, Menorrhagia with regular cycle   Location: [facility] OR ROOM 07 / [place] OR   Surgeons: Megan Salon, MD      DISCUSSION: Patient is a 29 year old female scheduled for the above procedure.  History includes never smoker, hypothyroidism, fatty liver, ADD, anxiety, RLS, exercise-induced asthma, migraines, anemia, depression.  BMI is consistent with morbid obesity.  - Patient had ED visit 06/29/19 for SOB and chest tightness x 1 week after PCP advised going to the ED to rule out PE. No hypoxia, negative troponin, normal EKG, negative CXR, no PE by CTA, mildly anemic but stable. Discharged home on albuterol MDI and steroid taper. (She had PCP follow-up with Debbrah Alar, NP on 07/21/19. Apparently seen for atypical chest pain dating back to at least 04/2019 with negative work-up in ED. Question if symptoms related to anxiety, but send referral to cardiology in January, but patient never scheduled.) - Admission 04/07/19-04/08/19 for lightheadedness, palpitations, exertional chest pain and SOB. She was found to be anemic with HGB of 6.6. S/p 2 Units PRBC and IV iron. Discharged with GYN follow-up and oral iron.   I evaluated patient during her 08/12/19 PAT visit due to recent ED visit. She reports some baseline DOE, but had been a little more noticeable with the seasonal changes.  Then she developed constant chest pain that was more noticeable with activity. She felt it may be chest tightness from her asthma, but went to the ED for further evaluation and to rule out PE. Since then she says her chest pain resolved. She continues to feel a little more DOE (uses albuterol MDI ~ 2-3X in last week which a little above her usual as needed regimen) since the  season change, but also notes that she has been "spring cleaning" and exerting herself more to prepare for an out of town friend to stay with her after surgery. Generally, doesn't have to stop and rest for DOE, but may have to rest briefly with activities such as taking the trash to the dumpster. She also suffers from anxiety. She has done a lot of planning to arrange help for surgery and does not want surgery to be cancelled. She denied any significant palpitations, new edema (has occasional left ankle edema from "arthritis"). She sleeps with her HOB at ~ 30 degree incline due to GERD. She reports migraines are controlled. Denied limited mouth or neck movement and no personal or family history of anesthesia complications. She received her second COVID-19 vaccine on 07/20/19.    Discussed ED visit and recent cardiology referral with anesthesiologist Bryson Ha. Although chest pain felt to be atypical in the ED would recommend primary care weigh in regarding timing of cardiology evaluation since referral mentioned in two notes over the past few months. I reached out to Debbrah Alar, NP who did recommend preoperative cardiology evaluation given symptoms were exertional with underlying history of morbid obesity. I contacted [facility] which had an appointment in their [place] office on 08/13/19, but patient was unable to make the appointment. She desires to reschedule surgery and schedule cardiology evaluation at a more convenient time and location. She was given the phone number to schedule a cardiology evaluation. I also told her that she would  not need to get her presurgical COVID-19 test on 08/13/19 if 08/17/19 surgery cancelled. I also spoke with Sterling Big at Dr. Rondel Baton office who will follow-up with Ms. Irmen to discuss future surgery dates available.   VS: BP 123/88   Pulse 100   Temp 36.9 C (Oral)   Resp 18   Ht 5\' 7"  (1.702 m)   Wt (!) 170.1 kg   LMP 07/18/2019   SpO2 100%   BMI  58.73 kg/m  Patient is A&O x4. No conversational dyspnea. Heart RRR, no murmur noted. Lungs clear. BMI 58. Large neck. Large ankles, but no pitting edema.    PROVIDERS: 07/20/2019, NP his PCP   LABS: Labs reviewed: Acceptable for surgery. AST 43, unchanged from 07/21/19. H/H 10.6/38,2 stable/slightly improved from 07/21/19 (s/p PRBC for HGB 6.6 04/07/19). A1c 5.0 on 07/30/19.  (all labs ordered are listed, but only abnormal results are displayed)   Labs Reviewed  CBC - Abnormal; Notable for the following components:      Result Value   Hemoglobin 10.6 (*)    MCV 75.8 (*)    MCH 21.0 (*)    MCHC 27.7 (*)    All other components within normal limits  COMPREHENSIVE METABOLIC PANEL - Abnormal; Notable for the following components:   CO2 21 (*)    Glucose, Bld 109 (*)    AST 43 (*)    All other components within normal limits     IMAGES: CTA Chest 06/29/19: IMPRESSION: 1. No evidence of pulmonary embolus. 2. No acute intrathoracic process. 3. Nonobstructing 5 mm left renal calculus. 4. Splenomegaly.  CXR 06/29/19: FINDINGS: Lungs clear. Heart size normal. No pneumothorax or pleural fluid. No acute or focal bony abnormality.  IMPRESSION: Negative chest.   EKG: 06/29/19: Normal sinus rhythm Normal ECG No significant change since prior 1/21 Confirmed by 2/21 [phone]) on 06/29/2019 11:55:04 AM  CV: Echocardiogram 11/23/15: Study Conclusions   - Left ventricle: The cavity size was normal. Wall thickness was  normal. Systolic function was normal. The estimated ejection  fraction was in the range of 60% to 65%. Although no diagnostic  regional wall motion abnormality was identified, this possibility  cannot be completely excluded on the basis of this study. Left  ventricular diastolic function parameters were normal.  - Aortic valve: There was no stenosis.  - Mitral valve: There was no regurgitation.  - Right ventricle: Poorly visualized. The  cavity size was normal.  Systolic function was normal.  - Pulmonary arteries: No complete TR doppler jet so unable to  estimate PA systolic pressure.  - Inferior vena cava: The vessel was normal in size. The  respirophasic diameter changes were in the normal range (>= 50%),  consistent with normal central venous pressure.  Impressions:  - Normal study.    Past Medical History:  Diagnosis Date  . ADD (attention deficit disorder)   . Anemia   . Anxiety   . Depression   . Exercise-induced asthma   . Fatty liver   . History of blood transfusion   . History of chicken pox   . History of kidney stones   . Hypothyroidism   . Migraines   . Pneumonia   . RLS (restless legs syndrome)   . Thyroid disease    hypothyroid    Past Surgical History:  Procedure Laterality Date  . KNEE ARTHROSCOPY Right 2007  . TONSILLECTOMY  2010  . WISDOM TOOTH EXTRACTION      MEDICATIONS: . albuterol (VENTOLIN HFA)  108 (90 Base) MCG/ACT inhaler  . aspirin-acetaminophen-caffeine (EXCEDRIN MIGRAINE) 250-250-65 MG tablet  . gabapentin (NEURONTIN) 600 MG tablet  . Iron-FA-B Cmp-C-Biot-Probiotic (FUSION PLUS) CAPS  . lamoTRIgine (LAMICTAL) 200 MG tablet  . levothyroxine (SYNTHROID) 100 MCG tablet  . levothyroxine (SYNTHROID) 112 MCG tablet  . Multiple Vitamins-Minerals (MULTIVITAMIN WITH MINERALS) tablet  . omeprazole (PRILOSEC) 20 MG capsule  . sertraline (ZOLOFT) 100 MG tablet  . VITAMIN D PO   No current facility-administered medications for this encounter.    Shonna Chock, PA-C Surgical Short Stay/Anesthesiology [facility] Phone [phone] [facility] Phone [phone] 08/13/2019 9:41 AM

## 2019-08-13 ENCOUNTER — Other Ambulatory Visit (HOSPITAL_COMMUNITY): Payer: Self-pay

## 2019-08-13 ENCOUNTER — Ambulatory Visit: Payer: BC Managed Care – PPO | Admitting: Cardiology

## 2019-08-13 ENCOUNTER — Telehealth: Payer: Self-pay

## 2019-08-13 DIAGNOSIS — R0789 Other chest pain: Secondary | ICD-10-CM

## 2019-08-13 NOTE — Telephone Encounter (Signed)
Per Anesthesia at [facility] patient will need cardiac clearance due to history of atypical chest pain. Patient had an appointment scheduled today to see Cardiology, but she was unable to get to the appointment. Spoke with patient who would like to cancel surgery and seek cardiac evaluation before rescheduling. Referral placed to Cardiology as the patient would like to be seen in [place]. Advised she will be contacted directly by the Cardiology office regarding scheduling her appointment. Advised after Cardiology appointment and obtaining clearance we can proceed with scheduling. Patient verbalizes understanding.  Routing to provider and will close encounter.

## 2019-08-14 ENCOUNTER — Telehealth: Payer: Self-pay | Admitting: Family

## 2019-08-14 NOTE — Telephone Encounter (Signed)
-----   Message from Jerold Coombe, PA-C sent at 08/13/2019  9:46 AM EDT ----- Regarding: follow-up Margaret Golden,  Thank you for getting back to me. Cardiologist Dr. Thomasene Ripple had an appointment available on 08/13/19 but in the  office (next available [employer] or [place] offices was next week). Margaret Golden ultimately said she would rather reschedule the surgery and schedule cardiology evaluation at a more convenient time/location. I gave her the number to schedule cardiology evaluation and let GYN Dr. Rondel Baton office know.   Thanks,  Shonna Chock, PA-C Surgical Short Stay/Anesthesiology [facility] Phone [phone] [facility] Phone [phone] 08/13/2019 9:51 AM

## 2019-08-17 ENCOUNTER — Encounter (HOSPITAL_COMMUNITY): Admission: RE | Payer: Self-pay | Source: Ambulatory Visit

## 2019-08-17 ENCOUNTER — Ambulatory Visit (HOSPITAL_COMMUNITY)
Admission: RE | Admit: 2019-08-17 | Payer: BC Managed Care – PPO | Source: Ambulatory Visit | Admitting: Obstetrics & Gynecology

## 2019-08-17 SURGERY — DILATATION & CURETTAGE/HYSTEROSCOPY WITH MYOSURE
Anesthesia: Choice

## 2019-08-19 DIAGNOSIS — F4323 Adjustment disorder with mixed anxiety and depressed mood: Secondary | ICD-10-CM | POA: Diagnosis not present

## 2019-08-24 ENCOUNTER — Ambulatory Visit: Payer: Self-pay | Admitting: Obstetrics & Gynecology

## 2019-08-25 DIAGNOSIS — F4323 Adjustment disorder with mixed anxiety and depressed mood: Secondary | ICD-10-CM | POA: Diagnosis not present

## 2019-08-26 ENCOUNTER — Other Ambulatory Visit: Payer: Self-pay

## 2019-08-26 ENCOUNTER — Other Ambulatory Visit (INDEPENDENT_AMBULATORY_CARE_PROVIDER_SITE_OTHER): Payer: BC Managed Care – PPO

## 2019-08-26 ENCOUNTER — Encounter: Payer: Self-pay | Admitting: Cardiology

## 2019-08-26 ENCOUNTER — Ambulatory Visit: Payer: BC Managed Care – PPO | Admitting: Cardiology

## 2019-08-26 VITALS — BP 110/88 | HR 82 | Ht 67.0 in | Wt 379.0 lb

## 2019-08-26 DIAGNOSIS — I10 Essential (primary) hypertension: Secondary | ICD-10-CM

## 2019-08-26 DIAGNOSIS — R0602 Shortness of breath: Secondary | ICD-10-CM | POA: Diagnosis not present

## 2019-08-26 DIAGNOSIS — R0789 Other chest pain: Secondary | ICD-10-CM | POA: Diagnosis not present

## 2019-08-26 DIAGNOSIS — R002 Palpitations: Secondary | ICD-10-CM

## 2019-08-26 DIAGNOSIS — D509 Iron deficiency anemia, unspecified: Secondary | ICD-10-CM

## 2019-08-26 NOTE — Patient Instructions (Signed)
Medication Instructions:  Your physician recommends that you continue on your current medications as directed. Please refer to the Current Medication list given to you today.  *If you need a refill on your cardiac medications before your next appointment, please call your pharmacy*   Lab Work: TODAY:  CBC  If you have labs (blood work) drawn today and your tests are completely normal, you will receive your results only by: Marland Kitchen MyChart Message (if you have MyChart) OR . A paper copy in the mail If you have any lab test that is abnormal or we need to change your treatment, we will call you to review the results.   Testing/Procedures: Your physician has requested that you have an echocardiogram. Echocardiography is a painless test that uses sound waves to create images of your heart. It provides your doctor with information about the size and shape of your heart and how well your heart's chambers and valves are working. This procedure takes approximately one hour. There are no restrictions for this procedure.    ZIO XT- Long Term Monitor Instructions   Your physician has requested you wear your ZIO patch monitor 7 days.   This is a single patch monitor.  [employer] supplies one patch monitor per enrollment.  Additional stickers are not available.   Please do not apply patch if you will be having a Nuclear Stress Test, Echocardiogram, Cardiac CT, MRI, or Chest Xray during the time frame you would be wearing the monitor. The patch cannot be worn during these tests.  You cannot remove and re-apply the ZIO XT patch monitor.   Your ZIO patch monitor will be sent USPS Priority mail from [facility] directly to your home address. The monitor may also be mailed to a PO BOX if home delivery is not available.   It may take 3-5 days to receive your monitor after you have been enrolled.   Once you have received you monitor, please review enclosed instructions.  Your monitor has already been  registered assigning a specific monitor serial # to you.   Applying the monitor   Shave hair from upper left chest.   Hold abrader disc by orange tab.  Rub abrader in 40 strokes over left upper chest as indicated in your monitor instructions.   Clean area with 4 enclosed alcohol pads .  Use all pads to assure are is cleaned thoroughly.  Let dry.   Apply patch as indicated in monitor instructions.  Patch will be place under collarbone on left side of chest with arrow pointing upward.   Rub patch adhesive wings for 2 minutes.Remove white label marked "1".  Remove white label marked "2".  Rub patch adhesive wings for 2 additional minutes.   While looking in a mirror, press and release button in center of patch.  A small green light will flash 3-4 times .  This will be your only indicator the monitor has been turned on.     Do not shower for the first 24 hours.  You may shower after the first 24 hours.   Press button if you feel a symptom. You will hear a small click.  Record Date, Time and Symptom in the Patient Log Book.   When you are ready to remove patch, follow instructions on last 2 pages of Patient Log Book.  Stick patch monitor onto last page of Patient Log Book.   Place Patient Log Book in [place] box.  Use locking tab on box and tape box closed  securely.  The [employer] and [employer] has [employer] on it.  Please place in mailbox as soon as possible.  Your physician should have your test results approximately 7 days after the monitor has been mailed back to [facility].   Call [facility] Customer Care at [phone] if you have questions regarding your ZIO XT patch monitor.  Call them immediately if you see an orange light blinking on your monitor.   If your monitor falls off in less than 4 days contact our Monitor department at [phone].  If your monitor becomes loose or falls off after 4 days call [employer] at [phone] for suggestions on securing your monitor.      Follow-Up: At [facility], you and your health needs are our priority.  As part of our continuing mission to provide you with exceptional heart care, we have created designated Provider Care Teams.  These Care Teams include your primary Cardiologist (physician) and Advanced Practice Providers (APPs -  Physician Assistants and Nurse Practitioners) who all work together to provide you with the care you need, when you need it.  We recommend signing up for the patient portal called "MyChart".  Sign up information is provided on this After Visit Summary.  MyChart is used to connect with patients for Virtual Visits (Telemedicine).  Patients are able to view lab/test results, encounter notes, upcoming appointments, etc.  Non-urgent messages can be sent to your provider as well.   To learn more about what you can do with MyChart, go to [email or web address].    Your next appointment:   2 month(s)  The format for your next appointment:   In Person  Provider:   Thomasene Ripple, DO   Other Instructions

## 2019-08-26 NOTE — Progress Notes (Signed)
Cardiology Office Note:    Date:  08/26/2019   ID:  [name], DOB 1990-05-23, MRN [id number]  PCP:  [name], NP  Cardiologist:  Thomasene Ripple, DO  Electrophysiologist:  None   Referring MD: Jerene Bears, MD   Chief Complaint  Patient presents with  . New Patient (Initial Visit)   "I am here to see you because have been really short of breath recently"  History of Present Illness:    Margaret Golden is a 29 y.o. female with a hx of fatty liver, anxiety and depression, morbid obesity who recently started the [facility] Weight loss program, and hypothyroidism.  Patient presents today to be evaluated for palpitations, shortness of breath and chest tightness.  She notes that over the last several weeks she has been experiencing shortness of breath.  But most remarkably was March after receiving her Covid vaccine started experience significant chest tightness.  At that time she went to the emergency department she was noted to be anemic and also received blood transfusion.  She tells me that the symptoms improved a little bit but not resolved.  She states that after this she started experiencing palpitations.  She reported frequent 2-3 times a week onset of rapid heartbeat which lasted for 5 to 10 minutes at a time prior to resolution.  During this time she experienced great chest tightness with worsening shortness of breath.  Nothing makes this better or worse.  She is concerned about this.  Past Medical History:  Diagnosis Date  . ADD (attention deficit disorder)   . Anemia   . Anxiety   . Depression   . Exercise-induced asthma   . Fatty liver   . History of blood transfusion   . History of chicken pox   . History of kidney stones   . Hypothyroidism   . Migraines   . Pneumonia   . RLS (restless legs syndrome)   . Thyroid disease    hypothyroid    Past Surgical History:  Procedure Laterality Date  . KNEE ARTHROSCOPY Right 2007  . TONSILLECTOMY  2010  . WISDOM  TOOTH EXTRACTION      Current Medications: Current Meds  Medication Sig  . albuterol (VENTOLIN HFA) 108 (90 Base) MCG/ACT inhaler Inhale 2 puffs into the lungs every 6 (six) hours as needed for wheezing or shortness of breath (Chest tightness).  Marland Kitchen aspirin-acetaminophen-caffeine (EXCEDRIN MIGRAINE) 250-250-65 MG tablet Take 2 tablets by mouth every 6 (six) hours as needed for headache.   . gabapentin (NEURONTIN) 600 MG tablet Take 1 tablet (600 mg total) by mouth at bedtime.  . Iron-FA-B Cmp-C-Biot-Probiotic (FUSION PLUS) CAPS One capsule twice daily with orange juice. Stop your Ferrous sulfate.  . lamoTRIgine (LAMICTAL) 200 MG tablet Take 1 tablet (200 mg total) by mouth daily.  Marland Kitchen levothyroxine (SYNTHROID) 100 MCG tablet Take 1 tablet (100 mcg total) by mouth daily.  Marland Kitchen levothyroxine (SYNTHROID) 112 MCG tablet Take 1 tablet (112 mcg total) by mouth daily.  . Multiple Vitamins-Minerals (MULTIVITAMIN WITH MINERALS) tablet Take 1 tablet by mouth daily.  Marland Kitchen omeprazole (PRILOSEC) 20 MG capsule Take 2 capsules (40 mg total) by mouth daily.  . sertraline (ZOLOFT) 100 MG tablet Take 2 tablets (200 mg total) by mouth daily. (Patient taking differently: Take 100 mg by mouth daily. )  . VITAMIN D PO Take 1 tablet by mouth daily.      Allergies:   Augmentin [amoxicillin-pot clavulanate]   Social History   Socioeconomic History  .  Marital status: Single    Spouse name: Not on file  . Number of children: Not on file  . Years of education: 101  . Highest education level: Not on file  Occupational History  . Not on file  Tobacco Use  . Smoking status: Never Smoker  . Smokeless tobacco: Never Used  Substance and Sexual Activity  . Alcohol use: Not Currently    Alcohol/week: 0.0 standard drinks  . Drug use: No  . Sexual activity: Never    Comment: never sexually active  Other Topics Concern  . Not on file  Social History Narrative   Unemployed   Lives with Dad   Seeking work   Only child    Dog 3   Lives in a one story house with father, has a cat-12/26/16-sjb   Social Determinants of Radio broadcast assistant Strain:   . Difficulty of Paying Living Expenses:   Food Insecurity:   . Worried About Charity fundraiser in the Last Year:   . Arboriculturist in the Last Year:   Transportation Needs:   . Film/video editor (Medical):   Marland Kitchen Lack of Transportation (Non-Medical):   Physical Activity:   . Days of Exercise per Week:   . Minutes of Exercise per Session:   Stress:   . Feeling of Stress :   Social Connections:   . Frequency of Communication with Friends and Family:   . Frequency of Social Gatherings with Friends and Family:   . Attends Religious Services:   . Active Member of Clubs or Organizations:   . Attends Archivist Meetings:   Marland Kitchen Marital Status:      Family History: The patient's family history includes Arthritis in her maternal grandfather, maternal grandmother, paternal grandfather, and paternal grandmother; Breast cancer in her maternal grandmother; Cancer in her paternal aunt and paternal uncle; Depression in her father and mother; Diabetes in her maternal grandfather.  ROS:   Review of Systems  Constitution: Negative for decreased appetite, fever and weight gain.  HENT: Negative for congestion, ear discharge, hoarse voice and sore throat.   Eyes: Negative for discharge, redness, vision loss in right eye and visual halos.  Cardiovascular: Reports chest pain, palpitations and dyspnea on exertion.  Negative for leg swelling, orthopnea and palpitations.  Respiratory: Negative for cough, hemoptysis, shortness of breath and snoring.   Endocrine: Negative for heat intolerance and polyphagia.  Hematologic/Lymphatic: Negative for bleeding problem. Does not bruise/bleed easily.  Skin: Negative for flushing, nail changes, rash and suspicious lesions.  Musculoskeletal: Negative for arthritis, joint pain, muscle cramps, myalgias, neck pain and  stiffness.  Gastrointestinal: Negative for abdominal pain, bowel incontinence, diarrhea and excessive appetite.  Genitourinary: Negative for decreased libido, genital sores and incomplete emptying.  Neurological: Negative for brief paralysis, focal weakness, headaches and loss of balance.  Psychiatric/Behavioral: Negative for altered mental status, depression and suicidal ideas.  Allergic/Immunologic: Negative for HIV exposure and persistent infections.    EKGs/Labs/Other Studies Reviewed:    The following studies were reviewed today:   EKG:  The ekg ordered today demonstrates sinus rhythm, heart rate 82 bpm with low voltage in the precordial leads-low voltage could be due to body habitus.  Otherwise normal EKG compared to EKG done June 30, 2019 no significant change.  Recent Labs: 07/21/2019: TSH 5.36 08/12/2019: ALT 27; BUN 8; Creatinine, Ser 0.95; Hemoglobin 10.6; Platelets 249; Potassium 3.7; Sodium 137  Recent Lipid Panel    Component Value Date/Time  CHOL 150 07/21/2019 1139   TRIG 183.0 (H) 07/21/2019 1139   HDL 35.50 (L) 07/21/2019 1139   CHOLHDL 4 07/21/2019 1139   VLDL 36.6 07/21/2019 1139   LDLCALC 78 07/21/2019 1139    Physical Exam:    VS:  BP 110/88   Pulse 82   Ht 5\' 7"  (1.702 m)   Wt (!) 379 lb (171.9 kg)   SpO2 99%   BMI 59.36 kg/m     Wt Readings from Last 3 Encounters:  08/26/19 (!) 379 lb (171.9 kg)  08/12/19 (!) 375 lb (170.1 kg)  07/30/19 (!) 371 lb (168.3 kg)     GEN: Well nourished, well developed in no acute distress HEENT: Normal NECK: No JVD; No carotid bruits LYMPHATICS: No lymphadenopathy CARDIAC: S1S2 noted,RRR, no murmurs, rubs, gallops RESPIRATORY:  Clear to auscultation without rales, wheezing or rhonchi  ABDOMEN: Soft, non-tender, non-distended, +bowel sounds, no guarding. EXTREMITIES: No edema, No cyanosis, no clubbing MUSCULOSKELETAL:  No deformity  SKIN: Warm and dry NEUROLOGIC:  Alert and oriented x 3,  non-focal PSYCHIATRIC:  Normal affect, good insight  ASSESSMENT:    1. Essential hypertension   2. Atypical chest pain   3. SOB (shortness of breath)   4. Morbid obesity (HCC)    PLAN:    1.  I would like to rule out a cardiovascular etiology of this palpitation, therefore at this time I would like to placed a zio patch for 7 days. In additon a transthoracic echocardiogram will be ordered to assess LV/RV function and any structural abnormalities. Once these testing have been performed amd reviewed further reccomendations will be made. For now, I do reccomend that the patient goes to the nearest ED if  symptoms recur.  2.  Her shortness of breath will be investigated with an echocardiogram as noted above.  3.  Morbid obesity-the patient [facility].  She is very determined to lose weight to help with improving her healthcare outcome.  4.  Blood work will be done today which will include CBC to make sure that her hemoglobin is not dropping again.  The patient is in agreement with the above plan. The patient left the office in stable condition.  The patient will follow up in 3 months.    Medication Adjustments/Labs and Tests Ordered: Current medicines are reviewed at length with the patient today.  Concerns regarding medicines are outlined above.  Orders Placed This Encounter  Procedures  . CBC  . LONG TERM MONITOR (3-14 DAYS)  . EKG 12-Lead  . ECHOCARDIOGRAM COMPLETE   No orders of the defined types were placed in this encounter.   Patient Instructions  Medication Instructions:  Your physician recommends that you continue on your current medications as directed. Please refer to the Current Medication list given to you today.  *If you need a refill on your cardiac medications before your next appointment, please call your pharmacy*   Lab Work: TODAY:  CBC  If you have labs (blood work) drawn today and your tests are completely normal, you will receive  your results only by: Marland Kitchen. MyChart Message (if you have MyChart) OR . A paper copy in the mail If you have any lab test that is abnormal or we need to change your treatment, we will call you to review the results.   Testing/Procedures: Your physician has requested that you have an echocardiogram. Echocardiography is a painless test that uses sound waves to create images of your heart. It provides  your doctor with information about the size and shape of your heart and how well your heart's chambers and valves are working. This procedure takes approximately one hour. There are no restrictions for this procedure.    ZIO XT- Long Term Monitor Instructions   Your physician has requested you wear your ZIO patch monitor 7 days.   This is a single patch monitor.  [employer] supplies one patch monitor per enrollment.  Additional stickers are not available.   Please do not apply patch if you will be having a Nuclear Stress Test, Echocardiogram, Cardiac CT, MRI, or Chest Xray during the time frame you would be wearing the monitor. The patch cannot be worn during these tests.  You cannot remove and re-apply the ZIO XT patch monitor.   Your ZIO patch monitor will be sent USPS Priority mail from [facility] directly to your home address. The monitor may also be mailed to a PO BOX if home delivery is not available.   It may take 3-5 days to receive your monitor after you have been enrolled.   Once you have received you monitor, please review enclosed instructions.  Your monitor has already been registered assigning a specific monitor serial # to you.   Applying the monitor   Shave hair from upper left chest.   Hold abrader disc by orange tab.  Rub abrader in 40 strokes over left upper chest as indicated in your monitor instructions.   Clean area with 4 enclosed alcohol pads .  Use all pads to assure are is cleaned thoroughly.  Let dry.   Apply patch as indicated in monitor instructions.  Patch  will be place under collarbone on left side of chest with arrow pointing upward.   Rub patch adhesive wings for 2 minutes.Remove white label marked "1".  Remove white label marked "2".  Rub patch adhesive wings for 2 additional minutes.   While looking in a mirror, press and release button in center of patch.  A small green light will flash 3-4 times .  This will be your only indicator the monitor has been turned on.     Do not shower for the first 24 hours.  You may shower after the first 24 hours.   Press button if you feel a symptom. You will hear a small click.  Record Date, Time and Symptom in the Patient Log Book.   When you are ready to remove patch, follow instructions on last 2 pages of Patient Log Book.  Stick patch monitor onto last page of Patient Log Book.   Place Patient Log Book in [place] box.  Use locking tab on box and tape box closed securely.  The [employer] and [employer] has [employer] on it.  Please place in mailbox as soon as possible.  Your physician should have your test results approximately 7 days after the monitor has been mailed back to [facility].   Call [facility] Customer Care at [phone] if you have questions regarding your ZIO XT patch monitor.  Call them immediately if you see an orange light blinking on your monitor.   If your monitor falls off in less than 4 days contact our Monitor department at [phone].  If your monitor becomes loose or falls off after 4 days call [employer] at [phone] for suggestions on securing your monitor.     Follow-Up: At [facility], you and your health needs are our priority.  As part of our continuing mission to provide you with  exceptional heart care, we have created designated Provider Care Teams.  These Care Teams include your primary Cardiologist (physician) and Advanced Practice Providers (APPs -  Physician Assistants and Nurse Practitioners) who all work together to provide you with the care you need,  when you need it.  We recommend signing up for the patient portal called "MyChart".  Sign up information is provided on this After Visit Summary.  MyChart is used to connect with patients for Virtual Visits (Telemedicine).  Patients are able to view lab/test results, encounter notes, upcoming appointments, etc.  Non-urgent messages can be sent to your provider as well.   To learn more about what you can do with MyChart, go to [email or web address].    Your next appointment:   2 month(s)  The format for your next appointment:   In Person  Provider:   Thomasene Ripple, DO   Other Instructions     Adopting a Healthy Lifestyle.  Know what a healthy weight is for you (roughly BMI <25) and aim to maintain this   Aim for 7+ servings of fruits and vegetables daily   65-80+ fluid ounces of water or unsweet tea for healthy kidneys   Limit to max 1 drink of alcohol per day; avoid smoking/tobacco   Limit animal fats in diet for cholesterol and heart health - choose grass fed whenever available   Avoid highly processed foods, and foods high in saturated/trans fats   Aim for low stress - take time to unwind and care for your mental health   Aim for 150 min of moderate intensity exercise weekly for heart health, and weights twice weekly for bone health   Aim for 7-9 hours of sleep daily   When it comes to diets, agreement about the perfect plan isnt easy to find, even among the experts. Experts at the [facility] of [employer] developed an idea known as the Healthy Eating Plate. Just imagine a plate divided into logical, healthy portions.   The emphasis is on diet quality:   Load up on vegetables and fruits - one-half of your plate: Aim for color and variety, and remember that potatoes dont count.   Go for whole grains - one-quarter of your plate: Whole wheat, barley, wheat berries, quinoa, oats, brown rice, and foods made with them. If you want pasta, go with whole wheat pasta.    Protein power - one-quarter of your plate: Fish, chicken, beans, and nuts are all healthy, versatile protein sources. Limit red meat.   The diet, however, does go beyond the plate, offering a few other suggestions.   Use healthy plant oils, such as olive, canola, soy, corn, sunflower and peanut. Check the labels, and avoid partially hydrogenated oil, which have unhealthy trans fats.   If youre thirsty, drink water. Coffee and tea are good in moderation, but skip sugary drinks and limit milk and dairy products to one or two daily servings.   The type of carbohydrate in the diet is more important than the amount. Some sources of carbohydrates, such as vegetables, fruits, whole grains, and beans-are healthier than others.   Finally, stay active  Signed, Thomasene Ripple, DO  08/26/2019 11:52 AM    [facility]

## 2019-08-27 ENCOUNTER — Other Ambulatory Visit: Payer: Self-pay | Admitting: Cardiology

## 2019-08-27 DIAGNOSIS — R0789 Other chest pain: Secondary | ICD-10-CM

## 2019-08-27 DIAGNOSIS — R0602 Shortness of breath: Secondary | ICD-10-CM

## 2019-08-27 DIAGNOSIS — R002 Palpitations: Secondary | ICD-10-CM

## 2019-08-27 DIAGNOSIS — I10 Essential (primary) hypertension: Secondary | ICD-10-CM

## 2019-08-27 LAB — CBC
Hematocrit: 33.4 % — ABNORMAL LOW (ref 34.0–46.6)
Hemoglobin: 9.3 g/dL — ABNORMAL LOW (ref 11.1–15.9)
MCH: 20.1 pg — ABNORMAL LOW (ref 26.6–33.0)
MCHC: 27.8 g/dL — ABNORMAL LOW (ref 31.5–35.7)
MCV: 72 fL — ABNORMAL LOW (ref 79–97)
Platelets: 234 10*3/uL (ref 150–450)
RBC: 4.62 x10E6/uL (ref 3.77–5.28)
RDW: 15.4 % (ref 11.7–15.4)
WBC: 8.4 10*3/uL (ref 3.4–10.8)

## 2019-08-27 NOTE — Addendum Note (Signed)
Addended by: Lita Mains on: 08/27/2019 10:35 AM   Modules accepted: Orders

## 2019-09-01 ENCOUNTER — Ambulatory Visit: Payer: Self-pay | Admitting: Obstetrics & Gynecology

## 2019-09-03 ENCOUNTER — Other Ambulatory Visit: Payer: Self-pay

## 2019-09-03 ENCOUNTER — Encounter (INDEPENDENT_AMBULATORY_CARE_PROVIDER_SITE_OTHER): Payer: Self-pay | Admitting: Family Medicine

## 2019-09-03 ENCOUNTER — Ambulatory Visit (INDEPENDENT_AMBULATORY_CARE_PROVIDER_SITE_OTHER): Payer: BC Managed Care – PPO | Admitting: Family Medicine

## 2019-09-03 VITALS — BP 144/114 | HR 94 | Temp 97.9°F | Ht 67.0 in | Wt 367.0 lb

## 2019-09-03 DIAGNOSIS — I10 Essential (primary) hypertension: Secondary | ICD-10-CM

## 2019-09-03 DIAGNOSIS — R739 Hyperglycemia, unspecified: Secondary | ICD-10-CM

## 2019-09-03 DIAGNOSIS — R748 Abnormal levels of other serum enzymes: Secondary | ICD-10-CM | POA: Diagnosis not present

## 2019-09-03 DIAGNOSIS — F418 Other specified anxiety disorders: Secondary | ICD-10-CM

## 2019-09-03 DIAGNOSIS — Z9189 Other specified personal risk factors, not elsewhere classified: Secondary | ICD-10-CM

## 2019-09-03 DIAGNOSIS — Z0289 Encounter for other administrative examinations: Secondary | ICD-10-CM

## 2019-09-03 DIAGNOSIS — R002 Palpitations: Secondary | ICD-10-CM

## 2019-09-03 DIAGNOSIS — R5383 Other fatigue: Secondary | ICD-10-CM | POA: Diagnosis not present

## 2019-09-03 DIAGNOSIS — G43809 Other migraine, not intractable, without status migrainosus: Secondary | ICD-10-CM

## 2019-09-03 DIAGNOSIS — R0602 Shortness of breath: Secondary | ICD-10-CM | POA: Diagnosis not present

## 2019-09-03 DIAGNOSIS — Z6841 Body Mass Index (BMI) 40.0 and over, adult: Secondary | ICD-10-CM

## 2019-09-03 DIAGNOSIS — E162 Hypoglycemia, unspecified: Secondary | ICD-10-CM | POA: Diagnosis not present

## 2019-09-03 NOTE — Progress Notes (Addendum)
Office: [phone]  /  Fax: [phone]    Date: September 15, 2019   Appointment Start Time: 3:04pm Duration: 39 minutes Provider: Lawerance Cruel, Psy.D. Type of Session: Intake for Individual Therapy  Location of Patient: Home Location of Provider: Provider's Home Type of Contact: Telepsychological Visit via MyChart Video Visit  Informed Consent: This provider called [name] at 3:02pm as she did not present for the telepsychological appointment. She indicated she would join the appointment. As such, today's appointment was initiated 4 minutes late. Prior to proceeding with today's appointment, two pieces of identifying information were obtained. In addition, Chastelyn's physical location at the time of this appointment was obtained as well a phone number she could be reached at in the event of technical difficulties. [name] and this provider participated in today's telepsychological service.   The provider's role was explained to [employer]. The provider reviewed and discussed issues of confidentiality, privacy, and limits therein (e.g., reporting obligations). In addition to verbal informed consent, written informed consent for psychological services was obtained prior to the initial appointment. Since the clinic is not a 24/7 crisis center, mental health emergency resources were shared and this  provider explained MyChart, e-mail, voicemail, and/or other messaging systems should be utilized only for non-emergency reasons. This provider also explained that information obtained during appointments will be placed in Asher's medical record and relevant information will be shared with other providers at [facility] for coordination of care. Moreover, Jasara agreed information may be shared with other [facility] providers as needed for coordination of care. By signing the service agreement document, Keely provided written consent for coordination of care. Prior to  initiating telepsychological services, Laticha completed an informed consent document, which included the development of a safety plan (i.e., an emergency contact, nearest emergency room, and emergency resources) in the event of an emergency/crisis. Leba expressed understanding of the rationale of the safety plan. Tauriel verbally acknowledged understanding she is ultimately responsible for understanding her insurance benefits for telepsychological and in-person services. This provider also reviewed confidentiality, as it relates to telepsychological services, as well as the rationale for telepsychological services (i.e., to reduce exposure risk to COVID-19). Bryahna  acknowledged understanding that appointments cannot be recorded without both party consent and she is aware she is responsible for securing confidentiality on her end of the session. Khadija verbally consented to proceed.  Chief Complaint/HPI: Lavanda was referred by Dr. Quillian Quince due to depression with anxiety. Per the note for the initial visit with Dr. Quillian Quince on September 03, 2019, "Ellyse is currently on lamictal 200 mg q daily and sertraline 100 mg 2 tablets q daily. Her PHQ-9 score is 14. She is seeing a therapist and life coach one time weekly and bi-weekly." The note for the initial appointment with Dr. Quillian Quince indicated the following: "Celia's habits were reviewed today and are as follows: her desired weight loss is 217 lbs, she has been heavy most of her life, she started gaining weight at a young age, her heaviest weight ever was 376 pounds, she is a picky eater and doesn't like to eat healthier foods, she has significant food cravings issues, she snacks frequently in the evenings, she skips meals frequently, she is frequently drinking liquids with calories, she frequently makes poor food choices, she frequently eats larger portions than normal and she struggles with emotional eating." Jacyln's Food and Mood (modified PHQ-9) score  on September 03, 2019 was 14.  During today's appointment, Monty was  verbally administered a questionnaire assessing various behaviors related to emotional eating. Lloyd endorsed the following: overeat when you are celebrating, eat certain foods when you are anxious, stressed, depressed, or your feelings are hurt, use food to help you cope with emotional situations, overeat when you are worried about something, overeat frequently when you are bored or lonely, not worry about what you eat when you are in a good mood and eat as a reward. Catheryne believes the onset of emotional eating was likely in childhood, adding she was diagnosed with MDD at age 29. She described the current frequency of emotional eating as "few times a week." In addition, Latrise denied a history of binge eating. Lizzie denied a history of restricting food intake, purging and engagement in other compensatory strategies, and has never been diagnosed with an eating disorder. She also denied a history of treatment for emotional eating. Moreover, Jerah indicated stress and "high depression" triggers emotional eating. She is unsure what makes emotional eating better. Furthermore, Lovena Le discussed ongoing stress. She explained her father moved out in March and she was terminated from her employment yesterday. Additionally, she noted ongoing medical concerns.   Mental Status Examination:  Appearance: well groomed and appropriate hygiene  Behavior: appropriate to circumstances Mood: sad Affect: mood congruent Speech: normal in rate, volume, and tone Eye Contact: appropriate Psychomotor Activity: appropriate Gait: unable to assess Thought Process: linear, logical, and goal directed  Thought Content/Perception: denies suicidal and homicidal ideation, plan, and intent and no hallucinations, delusions, bizarre thinking or behavior reported or observed Orientation: time, person, place, and purpose of appointment Memory/Concentration: memory,  attention, language, and fund of knowledge intact  Insight/Judgment: good  Family & Psychosocial History: Jenniefer reported she is not in a relationship and she does not have any children. She indicated she is currently unemployed, noting she was previously employed with an [employer]. Additionally, Benedetta shared her highest level of education obtained is a high school diploma. Currently, Deion's social support system consists of her father, maternal grandmother, and close friends. Moreover, Taitum stated she resides with her cats.   Medical History:  Past Medical History:  Diagnosis Date  . Acid reflux   . ADD (attention deficit disorder)   . Anemia   . Anxiety   . Arthritis    left ankle  . Back pain   . Chest pain   . Depression   . Dyspnea   . Exercise-induced asthma   . Fatty liver   . History of blood transfusion   . History of chicken pox   . History of kidney stones   . Hypothyroidism   . Joint pain   . Lower extremity edema   . Major depressive disorder   . Migraines    chronic  . Pneumonia   . RLS (restless legs syndrome)   . Thyroid disease    hypothyroid   Past Surgical History:  Procedure Laterality Date  . KNEE ARTHROSCOPY Right 2007  . TONSILLECTOMY  2010  . WISDOM TOOTH EXTRACTION     Current Outpatient Medications on File Prior to Visit  Medication Sig Dispense Refill  . albuterol (VENTOLIN HFA) 108 (90 Base) MCG/ACT inhaler Inhale 2 puffs into the lungs every 6 (six) hours as needed for wheezing or shortness of breath (Chest tightness). 18 g 0  . aspirin-acetaminophen-caffeine (EXCEDRIN MIGRAINE) 250-250-65 MG tablet Take 2 tablets by mouth every 6 (six) hours as needed for headache.     . gabapentin (NEURONTIN) 600 MG tablet Take  1 tablet (600 mg total) by mouth at bedtime. 30 tablet 4  . Iron-FA-B Cmp-C-Biot-Probiotic (FUSION PLUS) CAPS One capsule twice daily with orange juice. Stop your Ferrous sulfate. 30 capsule 1  . lamoTRIgine  (LAMICTAL) 200 MG tablet Take 1 tablet (200 mg total) by mouth daily. 30 tablet 3  . levothyroxine (SYNTHROID) 100 MCG tablet Take 1 tablet (100 mcg total) by mouth daily. 90 tablet 0  . levothyroxine (SYNTHROID) 112 MCG tablet Take 1 tablet (112 mcg total) by mouth daily. 90 tablet 0  . Multiple Vitamins-Minerals (MULTIVITAMIN WITH MINERALS) tablet Take 1 tablet by mouth daily.    Marland Kitchen omeprazole (PRILOSEC) 20 MG capsule Take 2 capsules (40 mg total) by mouth daily. 180 capsule 2  . sertraline (ZOLOFT) 100 MG tablet Take 2 tablets (200 mg total) by mouth daily. 60 tablet 3  . VITAMIN D PO Take 50 mcg by mouth daily.      No current facility-administered medications on file prior to visit.  Delrae denied a history of head injuries and loss of consciousness.    Mental Health History: Luretha reported she currently meets with Tina Griffiths, MA, noting she initiated services 4-5 years ago. The focus of treatment is addressing ongoing stressors. Aleeta stated they meet weekly, adding their next appointment will be early next week. Anyelin indicated Mr. White is aware that she is meeting with this provider and she was receptive to signing an authorization for coordination of care if deemed necessary. Markitta reported there is no history of hospitalizations for psychiatric concerns.[name] stated Corie Chiquito, PMHNP with [place] prescribes Lamictal and Zoloft, noting the medications are helpful. Corean endorsed a family history of mental health related concerns. She noted depression and anxiety runs on her mother's side and her father suffered from situational depression. Brinklee reported there is no history of trauma including psychological, physical  and sexual abuse, as well as neglect.   Mikayah described her typical mood lately as "neutral." Aside from concerns noted above and endorsed on the PHQ-9 and GAD-7, Cicily reported experiencing decreased motivation; occasional crying spells; and feeling stuck  with recent job loss. She also discussed a history of infrequent panic attacks, noting the last one was two weeks ago and it was characterized by racing heart, chest tightness, tensed body, and racing thoughts. She noted she copes by going into a dark/quiet room, calling a friend, or spending time with her cats. Charlisa endorsed infrequent alcohol use. She denied tobacco use. She denied illicit/recreational substance use. Regarding caffeine intake, Darianna reported consuming regular sodas (32 oz) daily. Furthermore, [name] indicated she is not experiencing the following: hallucinations and delusions, paranoia, symptoms of mania  and social withdrawal. She also denied current suicidal ideation, plan, and intent; history of and current homicidal ideation, plan, and intent; and history of and current engagement in self-harm.  Marcelia reported a history of suicidal ideation and plan (i.e., "suicide by starvation") during her teenage years. She denied a history of suicidal intent and attempts. Rajanae reported she last experienced suicidal ideation approximately two years ago. The following protective factors were identified for Jacqualin: friends, family, future, and fur babies. If she were to become overwhelmed in the future, which is a sign that a crisis may occur, she identified the following coping skills she could engage in: write down thoughts, play video games, go for a walk, call friend, draw, and read. It was recommended the aforementioned be written down and developed into a coping card for future reference; she  was observed writing. Psychoeducation regarding the importance of reaching out to a trusted individual and/or utilizing emergency resources if there is a change in emotional status and/or there is an inability to ensure safety was provided. Malini's confidence in reaching out to a trusted individual and/or utilizing emergency resources should there be an intensification in emotional status and/or there is  an inability to ensure safety was assessed on a scale of one to ten where one is not confident and ten is extremely confident. She reported her confidence is a 10. Additionally, Vania denied current access to firearms and/or weapons.   The following strengths were reported by [name]: creative, follow directions, and loyal. The following strengths were observed by this provider: ability to express thoughts and feelings during the therapeutic session, ability to establish and benefit from a therapeutic relationship, willingness to work toward established goal(s) with the clinic and ability to engage in reciprocal conversation.   Legal History: Yukiko reported there is no history of legal involvement.   Structured Assessments Results: The Patient Health Questionnaire-9 (PHQ-9) is a self-report measure that assesses symptoms and severity of depression over the course of the last two weeks. Kinjal obtained a score of 11 suggesting moderate depression. Keyera finds the endorsed symptoms to be very difficult. [0= Not at all; 1= Several days; 2= More than half the days; 3= Nearly every day] Little interest or pleasure in doing things 1  Feeling down, depressed, or hopeless 1  Trouble falling or staying asleep, or sleeping too much 2  Feeling tired or having little energy 3  Poor appetite or overeating 2  Feeling bad about yourself --- or that you are a failure or have let yourself or your family down 0  Trouble concentrating on things, such as reading the newspaper or watching television 2  Moving or speaking so slowly that other people could have noticed? Or the opposite --- being so fidgety or restless that you have been moving around a lot more than usual 0  Thoughts that you would be better off dead or hurting yourself in some way 0  PHQ-9 Score 11    The Generalized Anxiety Disorder-7 (GAD-7) is a brief self-report measure that assesses symptoms of anxiety over the course of the last two weeks.  Phung obtained a score of 6 suggesting mild anxiety. Quentina finds the endorsed symptoms to be very difficult. [0= Not at all; 1= Several days; 2= Over half the days; 3= Nearly every day] Feeling nervous, anxious, on edge 2  Not being able to stop or control worrying 1  Worrying too much about different things 1  Trouble relaxing 1  Being so restless that it's hard to sit still 0  Becoming easily annoyed or irritable 1  Feeling afraid as if something awful might happen 0  GAD-7 Score 6   Interventions:  Conducted a chart review Focused on rapport building Verbally administered PHQ-9 and GAD-7 for symptom monitoring Verbally administered Food & Mood questionnaire to assess various behaviors related to emotional eating Provided emphatic reflections and validation Collaborated with patient on a treatment goal  Psychoeducation provided regarding physical versus emotional hunger Conducted a risk assessment Developed a coping card  Provisional DSM-5 Diagnosis(es): 296.31 (F33.0) Major Depressive Disorder, Recurrent Episode, Mild, With Anxious Distress  Plan: Abria appears able and willing to participate as evidenced by collaboration on a treatment goal, engagement in reciprocal conversation, and asking questions as needed for clarification. The next appointment will be scheduled in two weeks, which will  be via MyChart Video Visit. The following treatment goal was established: increase coping skills. This provider will regularly review the treatment plan and medical chart to keep informed of status changes. [name] expressed understanding and agreement with the initial treatment plan of care. [name] will be sent a handout via e-mail to utilize between now and the next appointment to increase awareness of hunger patterns and subsequent eating. [name] provided verbal consent during today's appointment for this provider to send the handout via e-mail.

## 2019-09-03 NOTE — Progress Notes (Signed)
Chief Complaint:   OBESITY Margaret Golden (MR# [id number]) is a 29 y.o. female who presents for evaluation and treatment of obesity and related comorbidities. Current BMI is Body mass index is 57.48 kg/m. Margaret Golden has been struggling with her weight for many years and has been unsuccessful in either losing weight, maintaining weight loss, or reaching her healthy weight goal.  Margaret Golden is currently in the action stage of change and ready to dedicate time achieving and maintaining a healthier weight. Margaret Golden is interested in becoming our patient and working on intensive lifestyle modifications including (but not limited to) diet and exercise for weight loss.  Margaret Golden's habits were reviewed today and are as follows: her desired weight loss is 217 lbs, she has been heavy most of her life, she started gaining weight at a young age, her heaviest weight ever was 376 pounds, she is a picky eater and doesn't like to eat healthier foods, she has significant food cravings issues, she snacks frequently in the evenings, she skips meals frequently, she is frequently drinking liquids with calories, she frequently makes poor food choices, she frequently eats larger portions than normal and she struggles with emotional eating.  Depression Screen Margaret Golden's Food and Mood (modified PHQ-9) score was 14.  Depression screen PHQ 2/9 09/03/2019  Decreased Interest 1  Down, Depressed, Hopeless 2  PHQ - 2 Score 3  Altered sleeping 1  Tired, decreased energy 2  Change in appetite 2  Feeling bad or failure about yourself  2  Trouble concentrating 3  Moving slowly or fidgety/restless 1  Suicidal thoughts 0  PHQ-9 Score 14  Difficult doing work/chores Extremely dIfficult   Subjective:   1. Other fatigue Margaret Golden admits to daytime somnolence and admits to waking up still tired. Patent has a history of symptoms of daytime fatigue and morning headache. Margaret Golden generally gets 4 or 6 hours of sleep per night, and states  that she has difficulty falling asleep. Snoring is present. Apneic episodes are not present. Epworth Sleepiness Score is 5.  2. Shortness of breath Margaret Golden notes increasing shortness of breath with exercising and seems to be worsening over time with weight gain. She notes getting out of breath sooner with activity than she used to. This has not gotten worse recently. Katilynn denies shortness of breath at rest or orthopnea.  3. Essential hypertension Yulissa's blood pressure is elevated at her office visit today. She is closely followed by Cardiology and her primary care physician. She reports monitoring her blood pressure at home, however she is unsure of the readings. She states her readings at home are fine. She is not on anti-hypertensives.  4. Palpitations Margaret Golden is being followed by Cardiology and she has a holter monitor, and an upcoming echocardiogram with some shortness of breath with activity.  5. Hyperglycemia Margaret Golden's BGs in Epic on 08/12/2019 was 109. She is not on metformin.  6. Other migraine without status migrainosus, not intractable Margaret Golden reports chronic migraine, and can have up to 10-14 headaches per day per month. She has been using OTC Excedrin migraine 250 mg 2 tablets as needed.   7. Depression with anxiety Margaret Golden is currently on lamictal 200 mg q daily and sertraline 100 mg 2 tablets q daily. Her PHQ-9 score is 14. She is seeing a therapist and life coach one time weekly and bi-weekly.  8. At risk for heart disease Margaret Golden is at a higher than average risk for cardiovascular disease due to obesity and elevated blood pressure.  Assessment/Plan:   1. Other fatigue Nataleah does feel that her weight is causing her energy to be lower than it should be. Fatigue may be related to obesity, depression or many other causes. Labs will be ordered, and in the meanwhile, Holle will focus on self care including making healthy food choices, increasing physical activity and focusing on  stress reduction.  - Folate - VITAMIN D 25 Hydroxy (Vit-D Deficiency, Fractures) - Vitamin B12 - T3 - T4, free - TSH  2. Shortness of breath Brandii does feel that she gets out of breath more easily that she used to when she exercises. Qunisha's shortness of breath appears to be obesity related and exercise induced. She has agreed to work on weight loss and gradually increase exercise to treat her exercise induced shortness of breath. Will continue to monitor closely.  3. Essential hypertension Khia start her Category 3 meal plan, and will continue working on healthy weight loss and exercise to improve blood pressure control. We will watch for signs of hypotension as she continues her lifestyle modifications. We will check labs today.  - Lipid Panel With LDL/HDL Ratio  4. Palpitations We will follow up with Margaret Golden's Cardiology tests and notes to make sure we are giving her the appropriate advice for her health conditions.  5. Hyperglycemia Fasting labs will be obtained today and results with be discussed with Margaret Golden in 2 weeks at her follow up visit. In the meanwhile Margaret Golden will start her Category 3 meal plan and will work on weight loss efforts.  - Hemoglobin A1c - Insulin, random  6. Other migraine without status migrainosus, not intractable We will send a referral to [facility] with Dr. Lucia Gaskins for evaluation.  7. Depression with anxiety Behavior modification techniques were discussed today to help Margaret Golden deal with her depression and anxiety. We will refer to Dr. Dewaine Conger, our Bariatric Psychologist for evaluation. Orders and follow up as documented in patient record.   8. At risk for heart disease Margaret Golden was given approximately 30 minutes of coronary artery disease prevention counseling today. She is 29 y.o. female and has risk factors for heart disease including obesity and elevated blood pressure. We discussed intensive lifestyle modifications today with an  emphasis on specific weight loss instructions and strategies.   Repetitive spaced learning was employed today to elicit superior memory formation and behavioral change.  9. Class 3 severe obesity with serious comorbidity and body mass index (BMI) of 50.0 to 59.9 in adult, unspecified obesity type (HCC) Margaret Golden is currently in the action stage of change and her goal is to continue with weight loss efforts. I recommend Margaret Golden begin the structured treatment plan as follows:  She has agreed to the Category 3 Plan.  Exercise goals: No exercise has been prescribed for now, while we concentrate on nutritional changes.   Behavioral modification strategies: increasing lean protein intake, decreasing simple carbohydrates, no skipping meals and meal planning and cooking strategies.  She was informed of the importance of frequent follow-up visits to maximize her success with intensive lifestyle modifications for her multiple health conditions. She was informed we would discuss her lab results at her next visit unless there is a critical issue that needs to be addressed sooner. Margaret Golden agreed to keep her next visit at the agreed upon time to discuss these results.  Objective:   Blood pressure (!) 144/114, pulse 94, temperature 97.9 F (36.6 C), temperature source Oral, height 5\' 7"  (1.702 m), weight (!) 367 lb (166.5 kg), last menstrual  period 08/17/2019, SpO2 99 %. Body mass index is 57.48 kg/m.  EKG: Normal sinus rhythm, rate 82 BPM.  Indirect Calorimeter completed today shows a VO2 of 408 and a REE of 2837.  Her calculated basal metabolic rate is 1017 thus her basal metabolic rate is better than expected.  General: Cooperative, alert, well developed, in no acute distress. HEENT: Conjunctivae and lids unremarkable. Cardiovascular: Regular rhythm.  Lungs: Normal work of breathing. Neurologic: No focal deficits.   Lab Results  Component Value Date   CREATININE 0.95 08/12/2019   BUN 8 08/12/2019    NA 137 08/12/2019   K 3.7 08/12/2019   CL 104 08/12/2019   CO2 21 (L) 08/12/2019   Lab Results  Component Value Date   ALT 27 08/12/2019   AST 43 (H) 08/12/2019   ALKPHOS 75 08/12/2019   BILITOT 0.7 08/12/2019   Lab Results  Component Value Date   HGBA1C 5.0 07/30/2019   HGBA1C 4.6 07/06/2016   No results found for: INSULIN Lab Results  Component Value Date   TSH 5.36 (H) 07/21/2019   Lab Results  Component Value Date   CHOL 150 07/21/2019   HDL 35.50 (L) 07/21/2019   LDLCALC 78 07/21/2019   TRIG 183.0 (H) 07/21/2019   CHOLHDL 4 07/21/2019   Lab Results  Component Value Date   WBC 8.4 08/26/2019   HGB 9.3 (L) 08/26/2019   HCT 33.4 (L) 08/26/2019   MCV 72 (L) 08/26/2019   PLT 234 08/26/2019   Lab Results  Component Value Date   IRON 22 (L) 06/19/2019   TIBC 379 06/19/2019   FERRITIN 20 06/19/2019   Attestation Statements:   Reviewed by clinician on day of visit: allergies, medications, problem list, medical history, surgical history, family history, social history, and previous encounter notes.   I, Burt Knack, am acting as transcriptionist for Quillian Quince, MD.  I have reviewed the above documentation for accuracy and completeness, and I agree with the above. - Quillian Quince, MD

## 2019-09-04 LAB — LIPID PANEL WITH LDL/HDL RATIO
Cholesterol, Total: 150 mg/dL (ref 100–199)
HDL: 39 mg/dL — ABNORMAL LOW (ref >39)
LDL Chol Calc (NIH): 89 mg/dL (ref 0–99)
LDL/HDL Ratio: 2.3 ratio (ref 0.0–3.2)
Triglycerides: 120 mg/dL (ref 0–149)
VLDL Cholesterol Cal: 22 mg/dL (ref 5–40)

## 2019-09-04 LAB — HEMOGLOBIN A1C
Est. average glucose Bld gHb Est-mCnc: 94 mg/dL
Hgb A1c MFr Bld: 4.9 % (ref 4.8–5.6)

## 2019-09-04 LAB — VITAMIN D 25 HYDROXY (VIT D DEFICIENCY, FRACTURES): Vit D, 25-Hydroxy: 16.1 ng/mL — ABNORMAL LOW (ref 30.0–100.0)

## 2019-09-04 LAB — INSULIN, RANDOM: INSULIN: 29.2 u[IU]/mL — ABNORMAL HIGH (ref 2.6–24.9)

## 2019-09-04 LAB — FOLATE: Folate: 5.4 ng/mL (ref >3.0)

## 2019-09-04 LAB — T3: T3, Total: 188 ng/dL — ABNORMAL HIGH (ref 71–180)

## 2019-09-04 LAB — T4, FREE: Free T4: 1.09 ng/dL (ref 0.82–1.77)

## 2019-09-04 LAB — VITAMIN B12: Vitamin B-12: 473 pg/mL (ref 232–1245)

## 2019-09-04 LAB — TSH: TSH: 18.3 u[IU]/mL — ABNORMAL HIGH (ref 0.450–4.500)

## 2019-09-07 DIAGNOSIS — F4323 Adjustment disorder with mixed anxiety and depressed mood: Secondary | ICD-10-CM | POA: Diagnosis not present

## 2019-09-14 ENCOUNTER — Ambulatory Visit (HOSPITAL_BASED_OUTPATIENT_CLINIC_OR_DEPARTMENT_OTHER)
Admission: RE | Admit: 2019-09-14 | Discharge: 2019-09-14 | Disposition: A | Discharge status: Home / Self Care | Payer: BC Managed Care – PPO | Source: Ambulatory Visit | Attending: Cardiology | Admitting: Cardiology

## 2019-09-14 ENCOUNTER — Other Ambulatory Visit (HOSPITAL_COMMUNITY): Payer: Self-pay | Admitting: Cardiology

## 2019-09-14 ENCOUNTER — Other Ambulatory Visit: Payer: Self-pay

## 2019-09-14 DIAGNOSIS — R0602 Shortness of breath: Secondary | ICD-10-CM | POA: Diagnosis not present

## 2019-09-14 DIAGNOSIS — R0789 Other chest pain: Secondary | ICD-10-CM | POA: Insufficient documentation

## 2019-09-14 DIAGNOSIS — I10 Essential (primary) hypertension: Secondary | ICD-10-CM | POA: Diagnosis not present

## 2019-09-14 MED ORDER — PERFLUTREN LIPID MICROSPHERE
1.0000 mL | INTRAVENOUS | Status: AC | PRN
  Administered 2019-09-14: 2 mL via INTRAVENOUS
  Filled 2019-09-14: qty 10

## 2019-09-14 NOTE — Progress Notes (Signed)
  Echocardiogram 2D Echocardiogram has been performed.  Margaret Golden 09/14/2019, 2:54 PM

## 2019-09-15 ENCOUNTER — Telehealth: Payer: Self-pay

## 2019-09-15 ENCOUNTER — Telehealth (INDEPENDENT_AMBULATORY_CARE_PROVIDER_SITE_OTHER): Payer: BC Managed Care – PPO | Admitting: Psychology

## 2019-09-15 DIAGNOSIS — F330 Major depressive disorder, recurrent, mild: Secondary | ICD-10-CM

## 2019-09-15 DIAGNOSIS — F4323 Adjustment disorder with mixed anxiety and depressed mood: Secondary | ICD-10-CM | POA: Diagnosis not present

## 2019-09-15 NOTE — Progress Notes (Signed)
  Office: [phone]  /  Fax: [phone]    Date: September 29, 2019   Appointment Start Time: 10:34am Duration: 24 minutes Provider: Lawerance Cruel, Psy.D. Type of Session: Individual Therapy  Location of Patient: Home Location of Provider: [facility] Type of Contact: Telepsychological Visit via MyChart Video Visit  Session Content: This provider called Margaret Golden at 10:32am as she did not present for the telepsychological appointment. She noted she forgot today's appointment, but stated she could join. As such, today's appointment was initiated 4 minutes late. Margaret Golden is a 29 y.o. female presenting via MyChart Video Visit for a follow-up appointment to address the previously established treatment goal of increasing coping skills. Today's appointment was a telepsychological visit due to COVID-19. Margaret Golden provided verbal consent for today's telepsychological appointment and she is aware she is responsible for securing confidentiality on her end of the session. Prior to proceeding with today's appointment, [name]'s physical location at the time of this appointment was obtained as well a phone number she could be reached at in the event of technical difficulties. Margaret Golden and this provider participated in today's telepsychological service.   This provider conducted a brief check-in. Margaret Golden discussed "ups and downs" since the last appointment with this provider, including "securing" insurance and job hunting. She discussed challenges with eating due to decreased appetite secondary to ongoing stressors and feeling sick. Nevertheless, Margaret Golden reported she is trying to eat twice a day. Session focused on problem solving to assist Margaret Golden in increasing eating congruent to her meal plan. Margaret Golden reported she is eating breakfast with her medications and later dinner. Margaret Golden acknowledged snacking throughout the day; therefore, this provider and Margaret Golden discussed ways to increase protein intake. She  noted, "That actually makes things a lot easier." Margaret Golden was receptive to today's appointment as evidenced by openness to sharing, responsiveness to feedback, and willingness to implement discussed strategies .  Mental Status Examination:  Appearance: well groomed and appropriate hygiene  Behavior: appropriate to circumstances Mood: euthymic Affect: mood congruent Speech: normal in rate, volume, and tone Eye Contact: appropriate Psychomotor Activity: appropriate Gait: unable to assess Thought Process: linear, logical, and goal directed  Thought Content/Perception: denies suicidal and homicidal ideation, plan, and intent and no hallucinations, delusions, bizarre thinking or behavior reported or observed Orientation: time, person, place, and purpose of appointment Memory/Concentration: memory, attention, language, and fund of knowledge intact  Insight/Judgment: fair  Interventions:  Conducted a brief chart review Provided empathic reflections and validation Employed supportive psychotherapy interventions to facilitate reduced distress and to improve coping skills with identified stressors Engaged patient in problem solving  DSM-5 Diagnosis(es): 296.31 (F33.0) Major Depressive Disorder, Recurrent Episode, Mild, With Anxious Distress  Treatment Goal & Progress: During the initial appointment with this provider, the following treatment goal was established: increase coping skills. Progress is limited, as Margaret Golden has just begun treatment with this provider; however, she is receptive to the interaction and interventions and rapport is being established.   Plan: The next appointment will be scheduled in two weeks, which will be via MyChart Video Visit. The next session will focus on working towards the established treatment goal. Additionally, Margaret Golden reported her next appointment with her other therapist is tomorrow.

## 2019-09-15 NOTE — Telephone Encounter (Signed)
-----   Message from Thomasene Ripple, DO sent at 09/14/2019 10:22 PM EDT ----- Echo normal

## 2019-09-15 NOTE — Telephone Encounter (Signed)
Left message on patients voicemail to please return our call.   

## 2019-09-15 NOTE — Telephone Encounter (Signed)
Spoke with patient regarding results and recommendation.  Patient verbalizes understanding and is agreeable to plan of care. Advised patient to call back with any issues or concerns.  

## 2019-09-17 ENCOUNTER — Ambulatory Visit (INDEPENDENT_AMBULATORY_CARE_PROVIDER_SITE_OTHER): Payer: BC Managed Care – PPO | Admitting: Family Medicine

## 2019-09-17 ENCOUNTER — Encounter (INDEPENDENT_AMBULATORY_CARE_PROVIDER_SITE_OTHER): Payer: Self-pay | Admitting: Family Medicine

## 2019-09-17 ENCOUNTER — Other Ambulatory Visit: Payer: Self-pay

## 2019-09-17 VITALS — BP 112/66 | HR 100 | Temp 98.1°F | Ht 67.0 in | Wt 362.0 lb

## 2019-09-17 DIAGNOSIS — Z9189 Other specified personal risk factors, not elsewhere classified: Secondary | ICD-10-CM

## 2019-09-17 DIAGNOSIS — E038 Other specified hypothyroidism: Secondary | ICD-10-CM | POA: Diagnosis not present

## 2019-09-17 DIAGNOSIS — F418 Other specified anxiety disorders: Secondary | ICD-10-CM

## 2019-09-17 DIAGNOSIS — E7849 Other hyperlipidemia: Secondary | ICD-10-CM | POA: Diagnosis not present

## 2019-09-17 DIAGNOSIS — E559 Vitamin D deficiency, unspecified: Secondary | ICD-10-CM | POA: Diagnosis not present

## 2019-09-17 DIAGNOSIS — E8881 Metabolic syndrome: Secondary | ICD-10-CM

## 2019-09-17 DIAGNOSIS — Z6841 Body Mass Index (BMI) 40.0 and over, adult: Secondary | ICD-10-CM

## 2019-09-17 DIAGNOSIS — E88819 Insulin resistance, unspecified: Secondary | ICD-10-CM

## 2019-09-17 MED ORDER — VITAMIN D (ERGOCALCIFEROL) 1.25 MG (50000 UNIT) PO CAPS: 50000.0000 [IU] | ORAL_CAPSULE | ORAL | 0 refills | Status: DC

## 2019-09-22 NOTE — Progress Notes (Signed)
Chief Complaint:   OBESITY Margaret Golden is here to discuss her progress with her obesity treatment plan along with follow-up of her obesity related diagnoses. Margaret Golden is on the Category 3 Plan and states she is following her eating plan approximately 10% of the time. Margaret Golden states she is doing 0 minutes 0 times per week.  Today's visit was #: 2 Starting weight: 367 lbs Starting date: 09/03/2019 Today's weight: 362 lbs Today's date: 09/17/2019 Total lbs lost to date: 5 Total lbs lost since last in-office visit: 5  Interim History: Margaret Golden injured her foot at work, and it is getting better. She has had stress at work recently and was let go last week. Over the past week and a half, her appetite has been very poor the last several days.  Subjective:   1. Other specified hypothyroidism Margaret Golden's TSH is increasing from prior because she has not been taking her Synthroid regularly. She notes low energy, otherwise she denies any symptoms. I discussed labs with the patient today.  2. Other hyperlipidemia Margaret Golden's triglycerides recently decreased below 150 from 180's prior. Her HDL is too low and LDL is within normal limits. I discussed labs with the patient today.  3. Vitamin D deficiency Margaret Golden notes fatigue and being tired. She takes OTC Vit D but she is not sure of the dose she takes daily. I discussed labs with the patient today.  4. Depression with anxiety Margaret Golden is emotional eating component and she was seen by Dr. Dewaine Conger recently.  5. Insulin resistance Margaret Golden's A1c is 4.9 but fasting insulin level is elevated at 29. I discussed labs with the patient today.  6. At risk for diabetes mellitus Margaret Golden is at higher than average risk for developing diabetes due to her obesity.   Assessment/Plan:   1. Other specified hypothyroidism Patient with long-standing hypothyroidism, on levothyroxine therapy. She appears euthyroid. Margaret Golden is to take her medications as prescribed daily at current  dose. Orders and follow up as documented in patient record.   2. Other hyperlipidemia Cardiovascular risk and specific lipid/LDL goals reviewed. We discussed several lifestyle modifications today and Margaret Golden will continue to work on diet, exercise and weight loss efforts. We will continue to monitor as she achieves her weight loss goals. Orders and follow up as documented in patient record.   3. Vitamin D deficiency Low Vitamin D level contributes to fatigue and are associated with obesity, breast, and colon cancer. Margaret Golden agreed to start prescription Vitamin D 50,000 IU every week with no refills, and continue OTC Vit D. She will follow-up for routine testing of Vitamin D, at least 2-3 times per year to avoid over-replacement.  - Vitamin D, Ergocalciferol, (DRISDOL) 1.25 MG (50000 UNIT) CAPS capsule; Take 1 capsule (50,000 Units total) by mouth every 7 (seven) days.  Dispense: 4 capsule; Refill: 0  4. Depression with anxiety Behavior modification techniques were discussed today to help Margaret Golden deal with her emotional/non-hunger eating behaviors. Margaret Golden will continue to follow up with Dr. Dewaine Conger as advised. Orders and follow up as documented in patient record.   5. Insulin resistance Margaret Golden will continue to work on weight loss, exercise, and decreasing simple carbohydrates to help decrease the risk of diabetes. Extensive counseling was provided with the patient in relation to implications of insulin resistance. We will continue to follow up on labs every 3 to 4 months. Margaret Golden agreed to follow-up with [place] as directed to closely monitor her progress.  6. At risk for diabetes mellitus Margaret Golden was given  approximately 30 minutes of diabetes education and counseling today. We discussed intensive lifestyle modifications today with an emphasis on weight loss as well as increasing exercise and decreasing simple carbohydrates in her diet. We also reviewed medication options with an emphasis on risk versus benefit  of those discussed.   Repetitive spaced learning was employed today to elicit superior memory formation and behavioral change.  7. Class 3 severe obesity with serious comorbidity and body mass index (BMI) of 50.0 to 59.9 in adult, unspecified obesity type (HCC) Margaret Golden is currently in the action stage of change. As such, her goal is to continue with weight loss efforts. She has agreed to the Category 3 Plan and keeping a food journal and adhering to recommended goals of 1500-1800 calories and 100+ grams of protein daily.   We reviewed 10:1 ratio of calories to protein.  Behavioral modification strategies: increasing lean protein intake, planning for success and keeping a strict food journal.  Margaret Golden has agreed to follow-up with our clinic in 2 weeks. She was informed of the importance of frequent follow-up visits to maximize her success with intensive lifestyle modifications for her multiple health conditions.   Objective:   Blood pressure 112/66, pulse 100, temperature 98.1 F (36.7 C), temperature source Oral, height 5\' 7"  (1.702 m), weight (!) 362 lb (164.2 kg), SpO2 97 %. Body mass index is 56.7 kg/m.  General: Cooperative, alert, well developed, in no acute distress. HEENT: Conjunctivae and lids unremarkable. Cardiovascular: Regular rhythm.  Lungs: Normal work of breathing. Neurologic: No focal deficits.   Lab Results  Component Value Date   CREATININE 0.95 08/12/2019   BUN 8 08/12/2019   NA 137 08/12/2019   K 3.7 08/12/2019   CL 104 08/12/2019   CO2 21 (L) 08/12/2019   Lab Results  Component Value Date   ALT 27 08/12/2019   AST 43 (H) 08/12/2019   ALKPHOS 75 08/12/2019   BILITOT 0.7 08/12/2019   Lab Results  Component Value Date   HGBA1C 4.9 09/03/2019   HGBA1C 5.0 07/30/2019   HGBA1C 4.6 07/06/2016   Lab Results  Component Value Date   INSULIN 29.2 (H) 09/03/2019   Lab Results  Component Value Date   TSH 18.300 (H) 09/03/2019   Lab Results  Component  Value Date   CHOL 150 09/03/2019   HDL 39 (L) 09/03/2019   LDLCALC 89 09/03/2019   TRIG 120 09/03/2019   CHOLHDL 4 07/21/2019   Lab Results  Component Value Date   WBC 8.4 08/26/2019   HGB 9.3 (L) 08/26/2019   HCT 33.4 (L) 08/26/2019   MCV 72 (L) 08/26/2019   PLT 234 08/26/2019   Lab Results  Component Value Date   IRON 22 (L) 06/19/2019   TIBC 379 06/19/2019   FERRITIN 20 06/19/2019   Attestation Statements:   Reviewed by clinician on day of visit: allergies, medications, problem list, medical history, surgical history, family history, social history, and previous encounter notes.   I, Trixie Dredge, am acting as transcriptionist for Dennard Nip, MD.  I have reviewed the above documentation for accuracy and completeness, and I agree with the above. -  Dennard Nip, MD

## 2019-09-23 DIAGNOSIS — F4323 Adjustment disorder with mixed anxiety and depressed mood: Secondary | ICD-10-CM | POA: Diagnosis not present

## 2019-09-29 ENCOUNTER — Telehealth (INDEPENDENT_AMBULATORY_CARE_PROVIDER_SITE_OTHER): Payer: BC Managed Care – PPO | Admitting: Psychology

## 2019-09-29 ENCOUNTER — Other Ambulatory Visit: Payer: Self-pay

## 2019-09-29 ENCOUNTER — Other Ambulatory Visit: Payer: Self-pay | Admitting: Family

## 2019-09-29 DIAGNOSIS — D649 Anemia, unspecified: Secondary | ICD-10-CM

## 2019-09-29 DIAGNOSIS — F330 Major depressive disorder, recurrent, mild: Secondary | ICD-10-CM | POA: Diagnosis not present

## 2019-09-29 DIAGNOSIS — F4323 Adjustment disorder with mixed anxiety and depressed mood: Secondary | ICD-10-CM | POA: Diagnosis not present

## 2019-09-29 NOTE — Progress Notes (Signed)
Office: [phone]  /  Fax: [phone]    Date: October 12, 2019   Appointment Start Time: 10:31am Duration: 19 minutes Provider: Lawerance Cruel, Psy.D. Type of Session: Individual Therapy  Location of Patient: Home Location of Provider: [facility] Type of Contact: Telepsychological Visit via MyChart Video Visit  Session Content: Margaret Golden is a 29 y.o. female presenting via MyChart Video Visit for a follow-up appointment to address the previously established treatment goal of increasing coping skills. Today's appointment was a telepsychological visit due to COVID-19. [name] provided verbal consent for today's telepsychological appointment and she is aware she is responsible for securing confidentiality on her end of the session. Prior to proceeding with today's appointment, Maddyn's physical location at the time of this appointment was obtained as well a phone number she could be reached at in the event of technical difficulties. [name] and this provider participated in today's telepsychological service.   This provider conducted a brief check-in and the PHQ-9 and GAD-7 were administered. Destinae shared she has been busy with different appointments, adding it has impacted her energy and appetite. She noted challenges preparing foods; therefore, strategies were discussed (e.g., making a working plan for meals, doubling recipes, portioning snacks). Psychoeducation regarding SMART goals was provided and Louise was engaged in goal setting. The following goal was established for breakfast as it is reportedly the most challenging: [name] will eat breakfast congruent to her meal plan at least 3 out of 7 days every week between now and the next appointment with this provider. [name] was receptive to today's appointment as evidenced by openness to sharing, responsiveness to feedback, and willingness to implement discussed strategies .  Mental Status Examination:  Appearance: well groomed  and appropriate hygiene  Behavior: appropriate to circumstances Mood: sad Affect: mood congruent Speech: normal in rate, volume, and tone Eye Contact: appropriate Psychomotor Activity: appropriate Gait: unable to assess Thought Process: linear, logical, and goal directed  Thought Content/Perception: no hallucinations, delusions, bizarre thinking or behavior reported or observed and no evidence of suicidal and homicidal ideation, plan, and intent Orientation: time, person, place, and purpose of appointment Memory/Concentration: memory, attention, language, and fund of knowledge intact  Insight/Judgment: fair  Structured Assessments Results: The Patient Health Questionnaire-9 (PHQ-9) is a self-report measure that assesses symptoms and severity of depression over the course of the last two weeks. [name] obtained a score of 13 suggesting moderate depression. Elim finds the endorsed symptoms to be somewhat difficult. [0= Not at all; 1= Several days; 2= More than half the days; 3= Nearly every day] Little interest or pleasure in doing things 1  Feeling down, depressed, or hopeless 1  Trouble falling or staying asleep, or sleeping too much 3  Feeling tired or having little energy 3  Poor appetite or overeating 3  Feeling bad about yourself --- or that you are a failure or have let yourself or your family down 0  Trouble concentrating on things, such as reading the newspaper or watching television 1  Moving or speaking so slowly that other people could have noticed? Or the opposite --- being so fidgety or restless that you have been moving around a lot more than usual 1  Thoughts that you would be better off dead or hurting yourself in some way 0  PHQ-9 Score 13    The Generalized Anxiety Disorder-7 (GAD-7) is a brief self-report measure that assesses symptoms of anxiety over the course of the last two weeks. Annissa obtained a score of 3 suggesting  minimal anxiety. Anabela finds the endorsed  symptoms to be somewhat difficult. [0= Not at all; 1= Several days; 2= Over half the days; 3= Nearly every day] Feeling nervous, anxious, on edge 1  Not being able to stop or control worrying 0  Worrying too much about different things 0  Trouble relaxing 1  Being so restless that it's hard to sit still 0  Becoming easily annoyed or irritable 1  Feeling afraid as if something awful might happen 0  GAD-7 Score 3   Interventions:  Conducted a brief chart review Verbally administered PHQ-9 and GAD-7 for symptom monitoring Provided empathic reflections and validation Employed supportive psychotherapy interventions to facilitate reduced distress and to improve coping skills with identified stressors Engaged patient in goal setting Psychoeducation provided regarding SMART goals  Engaged patient in problem solving  DSM-5 Diagnosis(es): 296.31 (F33.0) Major Depressive Disorder, Recurrent Episode, Mild, With Anxious Distress  Treatment Goal & Progress: During the initial appointment with this provider, the following treatment goal was established: increase coping skills. [name] has demonstrated progress in her goal as evidenced by increased awareness of hunger patterns.   Plan: The next appointment will be scheduled in two weeks, which will be via MyChart Video Visit. The next session will focus on working towards the established treatment goal. In addition, [name] stated her next appointment with her other therapist (Mr. Cliffton Asters) is on October 20, 2019.

## 2019-09-30 ENCOUNTER — Encounter: Payer: Self-pay | Admitting: Family

## 2019-09-30 ENCOUNTER — Inpatient Hospital Stay: Payer: BC Managed Care – PPO

## 2019-09-30 ENCOUNTER — Inpatient Hospital Stay: Payer: BC Managed Care – PPO | Attending: Family | Admitting: Family

## 2019-09-30 ENCOUNTER — Other Ambulatory Visit: Payer: Self-pay

## 2019-09-30 VITALS — BP 117/85 | HR 85 | Temp 98.5°F | Resp 18 | Ht 67.0 in | Wt 371.0 lb

## 2019-09-30 DIAGNOSIS — Z833 Family history of diabetes mellitus: Secondary | ICD-10-CM | POA: Diagnosis not present

## 2019-09-30 DIAGNOSIS — Z803 Family history of malignant neoplasm of breast: Secondary | ICD-10-CM | POA: Insufficient documentation

## 2019-09-30 DIAGNOSIS — F4323 Adjustment disorder with mixed anxiety and depressed mood: Secondary | ICD-10-CM | POA: Diagnosis not present

## 2019-09-30 DIAGNOSIS — Z800 Family history of malignant neoplasm of digestive organs: Secondary | ICD-10-CM | POA: Insufficient documentation

## 2019-09-30 DIAGNOSIS — N920 Excessive and frequent menstruation with regular cycle: Secondary | ICD-10-CM | POA: Diagnosis not present

## 2019-09-30 DIAGNOSIS — Z808 Family history of malignant neoplasm of other organs or systems: Secondary | ICD-10-CM | POA: Diagnosis not present

## 2019-09-30 DIAGNOSIS — Z79899 Other long term (current) drug therapy: Secondary | ICD-10-CM | POA: Insufficient documentation

## 2019-09-30 DIAGNOSIS — G4700 Insomnia, unspecified: Secondary | ICD-10-CM | POA: Diagnosis not present

## 2019-09-30 DIAGNOSIS — Z8349 Family history of other endocrine, nutritional and metabolic diseases: Secondary | ICD-10-CM | POA: Insufficient documentation

## 2019-09-30 DIAGNOSIS — D649 Anemia, unspecified: Secondary | ICD-10-CM

## 2019-09-30 DIAGNOSIS — D500 Iron deficiency anemia secondary to blood loss (chronic): Secondary | ICD-10-CM | POA: Diagnosis not present

## 2019-09-30 DIAGNOSIS — E039 Hypothyroidism, unspecified: Secondary | ICD-10-CM

## 2019-09-30 DIAGNOSIS — Z8261 Family history of arthritis: Secondary | ICD-10-CM | POA: Diagnosis not present

## 2019-09-30 DIAGNOSIS — K219 Gastro-esophageal reflux disease without esophagitis: Secondary | ICD-10-CM | POA: Diagnosis not present

## 2019-09-30 DIAGNOSIS — F419 Anxiety disorder, unspecified: Secondary | ICD-10-CM | POA: Diagnosis not present

## 2019-09-30 DIAGNOSIS — F329 Major depressive disorder, single episode, unspecified: Secondary | ICD-10-CM | POA: Diagnosis not present

## 2019-09-30 DIAGNOSIS — G2581 Restless legs syndrome: Secondary | ICD-10-CM | POA: Insufficient documentation

## 2019-09-30 DIAGNOSIS — Z7982 Long term (current) use of aspirin: Secondary | ICD-10-CM | POA: Insufficient documentation

## 2019-09-30 LAB — CBC WITH DIFFERENTIAL (CANCER CENTER ONLY)
Abs Immature Granulocytes: 0.05 10*3/uL (ref 0.00–0.07)
Basophils Absolute: 0 10*3/uL (ref 0.0–0.1)
Basophils Relative: 1 %
Eosinophils Absolute: 0.2 10*3/uL (ref 0.0–0.5)
Eosinophils Relative: 3 %
HCT: 32.7 % — ABNORMAL LOW (ref 36.0–46.0)
Hemoglobin: 9.1 g/dL — ABNORMAL LOW (ref 12.0–15.0)
Immature Granulocytes: 1 %
Lymphocytes Relative: 23 %
Lymphs Abs: 1.7 10*3/uL (ref 0.7–4.0)
MCH: 20.3 pg — ABNORMAL LOW (ref 26.0–34.0)
MCHC: 27.8 g/dL — ABNORMAL LOW (ref 30.0–36.0)
MCV: 73 fL — ABNORMAL LOW (ref 80.0–100.0)
Monocytes Absolute: 0.4 10*3/uL (ref 0.1–1.0)
Monocytes Relative: 5 %
Neutro Abs: 5.1 10*3/uL (ref 1.7–7.7)
Neutrophils Relative %: 67 %
Platelet Count: 214 10*3/uL (ref 150–400)
RBC: 4.48 MIL/uL (ref 3.87–5.11)
RDW: 15.6 % — ABNORMAL HIGH (ref 11.5–15.5)
WBC Count: 7.5 10*3/uL (ref 4.0–10.5)
nRBC: 0 % (ref 0.0–0.2)

## 2019-09-30 LAB — CMP (CANCER CENTER ONLY)
ALT: 14 U/L (ref 0–44)
AST: 27 U/L (ref 15–41)
Albumin: 3.9 g/dL (ref 3.5–5.0)
Alkaline Phosphatase: 69 U/L (ref 38–126)
Anion gap: 7 (ref 5–15)
BUN: 8 mg/dL (ref 6–20)
CO2: 25 mmol/L (ref 22–32)
Calcium: 8.5 mg/dL — ABNORMAL LOW (ref 8.9–10.3)
Chloride: 107 mmol/L (ref 98–111)
Creatinine: 0.74 mg/dL (ref 0.44–1.00)
GFR, Est AFR Am: >60 mL/min (ref >60)
GFR, Estimated: >60 mL/min (ref >60)
Glucose, Bld: 108 mg/dL — ABNORMAL HIGH (ref 70–99)
Potassium: 3.9 mmol/L (ref 3.5–5.1)
Sodium: 139 mmol/L (ref 135–145)
Total Bilirubin: 0.4 mg/dL (ref 0.3–1.2)
Total Protein: 6.8 g/dL (ref 6.5–8.1)

## 2019-09-30 LAB — RETICULOCYTES
Immature Retic Fract: 29.4 % — ABNORMAL HIGH (ref 2.3–15.9)
RBC.: 4.39 MIL/uL (ref 3.87–5.11)
Retic Count, Absolute: 96.6 10*3/uL (ref 19.0–186.0)
Retic Ct Pct: 2.2 % (ref 0.4–3.1)

## 2019-09-30 LAB — SAVE SMEAR(SSMR), FOR PROVIDER SLIDE REVIEW

## 2019-09-30 LAB — LACTATE DEHYDROGENASE: LDH: 223 U/L — ABNORMAL HIGH (ref 98–192)

## 2019-09-30 NOTE — Progress Notes (Signed)
Hematology/Oncology Consultation   Name: Margaret Golden      MRN: [id number]    Location: Room/bed info not found  Date: 09/30/2019 Time:11:30 AM   REFERRING PHYSICIAN: Kardie Tobb, DO  REASON FOR CONSULT: Microcytic anemia    DIAGNOSIS: Iron deficiency anemia secondary to heavy irregular cycles  HISTORY OF PRESENT ILLNESS: Margaret Golden is a very pleasant 29 yo caucasian female with long history of iron deficiency anemia. She has heavy irregular cycles (lasting 4-7 days) and has noted clots.  She was hospitalized back in January with anemia and received 2 units of blood and IV iron during admission for Hgb 6.6.  Hgb today is 9.1, MCV 73 and platelets 214.  She was scheduled for a D&C with IUD placement earlier this month but had to put on hold while she waits on surgical clearance from cardiology.  She is symptomatic with fatigue, "brain fog" with memory and concentration difficulty, chest tightness, SOB with exertion, chills, palpitations and insomnia.  She has also noted dizziness but has history of vertigo. She has puffiness in her ankles and feet that comes and foes.  She has numbness and tingling in the left ankle and foot due to arthritis in the left ankle.  No fever, n/v, cough, rash, abdominal pain or changes in bowel or bladder habits.  She occasional episodes of constipation and noted a little bright red blood on her toilet tissue at times with straining.  No family history of anemia that she is aware of.  She was recently referred to the weight loss and wellness center here in town.  Her appetite comes and goes. She admits that she needs to better hydrate throughout the day. Her weight is stable.  She does not smoke, drink alcoholic beverages or use recreational drugs.  No personal history of cancer. Family history includes: paternal uncle - brain, paternal aunt - liver and maternal grandmother - breast.   She was recently lost her job with [employer] and is currently looking for  something new.   ROS: All other 10 point review of systems is negative.   PAST MEDICAL HISTORY:   Past Medical History:  Diagnosis Date  . Acid reflux   . ADD (attention deficit disorder)   . Anemia   . Anxiety   . Arthritis    left ankle  . Back pain   . Chest pain   . Depression   . Dyspnea   . Exercise-induced asthma   . Fatty liver   . History of blood transfusion   . History of chicken pox   . History of kidney stones   . Hypothyroidism   . Joint pain   . Lower extremity edema   . Major depressive disorder   . Migraines    chronic  . Pneumonia   . RLS (restless legs syndrome)   . Thyroid disease    hypothyroid    ALLERGIES: Allergies  Allergen Reactions  . Augmentin [Amoxicillin-Pot Clavulanate] Hives      MEDICATIONS:  Current Outpatient Medications on File Prior to Visit  Medication Sig Dispense Refill  . albuterol (VENTOLIN HFA) 108 (90 Base) MCG/ACT inhaler Inhale 2 puffs into the lungs every 6 (six) hours as needed for wheezing or shortness of breath (Chest tightness). 18 g 0  . aspirin-acetaminophen-caffeine (EXCEDRIN MIGRAINE) 250-250-65 MG tablet Take 2 tablets by mouth every 6 (six) hours as needed for headache.     . gabapentin (NEURONTIN) 600 MG tablet Take 1 tablet (600 mg total) by  mouth at bedtime. 30 tablet 4  . Iron-FA-B Cmp-C-Biot-Probiotic (FUSION PLUS) CAPS One capsule twice daily with orange juice. Stop your Ferrous sulfate. 30 capsule 1  . lamoTRIgine (LAMICTAL) 200 MG tablet Take 1 tablet (200 mg total) by mouth daily. 30 tablet 3  . levothyroxine (SYNTHROID) 100 MCG tablet Take 1 tablet (100 mcg total) by mouth daily. 90 tablet 0  . levothyroxine (SYNTHROID) 112 MCG tablet Take 1 tablet (112 mcg total) by mouth daily. 90 tablet 0  . Multiple Vitamins-Minerals (MULTIVITAMIN WITH MINERALS) tablet Take 1 tablet by mouth daily.    Marland Kitchen omeprazole (PRILOSEC) 20 MG capsule Take 2 capsules (40 mg total) by mouth daily. 180 capsule 2  . sertraline  (ZOLOFT) 100 MG tablet Take 2 tablets (200 mg total) by mouth daily. 60 tablet 3  . VITAMIN D PO Take 50 mcg by mouth daily.     . Vitamin D, Ergocalciferol, (DRISDOL) 1.25 MG (50000 UNIT) CAPS capsule Take 1 capsule (50,000 Units total) by mouth every 7 (seven) days. 4 capsule 0   No current facility-administered medications on file prior to visit.     PAST SURGICAL HISTORY Past Surgical History:  Procedure Laterality Date  . KNEE ARTHROSCOPY Right 2007  . TONSILLECTOMY  2010  . WISDOM TOOTH EXTRACTION      FAMILY HISTORY: Family History  Problem Relation Age of Onset  . Depression Mother   . Obesity Mother   . Depression Father   . Sleep apnea Father   . Obesity Father   . Cancer Paternal Aunt        colon  . Cancer Paternal Uncle        brain cancer  . Diabetes Maternal Grandfather   . Arthritis Maternal Grandfather   . Arthritis Maternal Grandmother   . Breast cancer Maternal Grandmother   . Arthritis Paternal Grandmother   . Arthritis Paternal Grandfather     SOCIAL HISTORY:  reports that she has never smoked. She has never used smokeless tobacco. She reports previous alcohol use. She reports that she does not use drugs.  PERFORMANCE STATUS: The patient's performance status is 1 - Symptomatic but completely ambulatory  PHYSICAL EXAM: Most Recent Vital Signs: There were no vitals taken for this visit. BP 117/85 (BP Location: Left Wrist, Patient Position: Sitting)   Pulse 85   Temp 98.5 F (36.9 C) (Oral)   Resp 18   Ht 5\' 7"  (1.702 m)   Wt (!) 371 lb (168.3 kg)   SpO2 100%   BMI 58.11 kg/m   General Appearance:    Alert, cooperative, no distress, appears stated age  Head:    Normocephalic, without obvious abnormality, atraumatic  Eyes:    PERRL, conjunctiva/corneas clear, EOM's intact, fundi    benign, both eyes        Throat:   Lips, mucosa, and tongue normal; teeth and gums normal  Neck:   Supple, symmetrical, trachea midline, no adenopathy;     thyroid:  no enlargement/tenderness/nodules; no carotid   bruit or JVD  Back:     Symmetric, no curvature, ROM normal, no CVA tenderness  Lungs:     Clear to auscultation bilaterally, respirations unlabored  Chest Wall:    No tenderness or deformity   Heart:    Regular rate and rhythm, S1 and S2 normal, no murmur, rub   or gallop     Abdomen:     Soft, non-tender, bowel sounds active all four quadrants,    no masses, no  organomegaly        Extremities:   Extremities normal, atraumatic, no cyanosis or edema  Pulses:   2+ and symmetric all extremities  Skin:   Skin color, texture, turgor normal, no rashes or lesions  Lymph nodes:   Cervical, supraclavicular, and axillary nodes normal  Neurologic:   CNII-XII intact, normal strength, sensation and reflexes    throughout    LABORATORY DATA:  Results for orders placed or performed in visit on 09/30/19 (from the past 48 hour(s))  Save Smear (SSMR)     Status: None   Collection Time: 09/30/19 10:47 AM  Result Value Ref Range   Smear Review SMEAR STAINED AND AVAILABLE FOR REVIEW     Comment: Performed at [facility] at [facility] [place], [address], [place], [place] [address]  Reticulocytes     Status: Abnormal   Collection Time: 09/30/19 10:47 AM  Result Value Ref Range   Retic Ct Pct 2.2 0.4 - 3.1 %   RBC. 4.39 3.87 - 5.11 MIL/uL   Retic Count, Absolute 96.6 19.0 - 186.0 K/uL   Immature Retic Fract 29.4 (H) 2.3 - 15.9 %    Comment: Performed at [facility] at [facility] [place], [address], [place], [place] [address]  CMP (Cancer Center only)     Status: Abnormal   Collection Time: 09/30/19 10:47 AM  Result Value Ref Range   Sodium 139 135 - 145 mmol/L   Potassium 3.9 3.5 - 5.1 mmol/L   Chloride 107 98 - 111 mmol/L   CO2 25 22 - 32 mmol/L   Glucose, Bld 108 (H) 70 - 99 mg/dL    Comment: Glucose reference range applies only to samples taken after fasting for at least 8 hours.   BUN  8 6 - 20 mg/dL   Creatinine 0.980.74 1.190.44 - 1.00 mg/dL   Calcium 8.5 (L) 8.9 - 10.3 mg/dL   Total Protein 6.8 6.5 - 8.1 g/dL   Albumin 3.9 3.5 - 5.0 g/dL   AST 27 15 - 41 U/L   ALT 14 0 - 44 U/L   Alkaline Phosphatase 69 38 - 126 U/L   Total Bilirubin 0.4 0.3 - 1.2 mg/dL   GFR, Est Non Af Am >14>60 >60 mL/min   GFR, Est AFR Am >60 >60 mL/min   Anion gap 7 5 - 15    Comment: Performed at [facility], [address] [place], [place] [address]  CBC with Differential (Cancer Center Only)     Status: Abnormal   Collection Time: 09/30/19 10:47 AM  Result Value Ref Range   WBC Count 7.5 4.0 - 10.5 K/uL   RBC 4.48 3.87 - 5.11 MIL/uL   Hemoglobin 9.1 (L) 12.0 - 15.0 g/dL    Comment: Reticulocyte Hemoglobin testing may be clinically indicated, consider ordering this additional test [id number]    HCT 32.7 (L) 36 - 46 %   MCV 73.0 (L) 80.0 - 100.0 fL   MCH 20.3 (L) 26.0 - 34.0 pg   MCHC 27.8 (L) 30.0 - 36.0 g/dL   RDW 78.415.6 (H) 69.611.5 - 29.515.5 %   Platelet Count 214 150 - 400 K/uL   nRBC 0.0 0.0 - 0.2 %   Neutrophils Relative % 67 %   Neutro Abs 5.1 1.7 - 7.7 K/uL   Lymphocytes Relative 23 %   Lymphs Abs 1.7 0.7 - 4.0 K/uL   Monocytes Relative 5 %  Monocytes Absolute 0.4 0 - 1 K/uL   Eosinophils Relative 3 %   Eosinophils Absolute 0.2 0 - 0 K/uL   Basophils Relative 1 %   Basophils Absolute 0.0 0 - 0 K/uL   Immature Granulocytes 1 %   Abs Immature Granulocytes 0.05 0.00 - 0.07 K/uL    Comment: Performed at [facility] at [facility], [address] [place], [place] [address]      RADIOGRAPHY: No results found.     PATHOLOGY: None   ASSESSMENT/PLAN: Ms. Sabado is a very pleasant 29 yo caucasian female with long history of iron deficiency anemia secondary to heavy irregular cycles.  We will see what her iron studies look like and replace. She is Solicitor as of tomorrow so we will get this updated once she has a number and  determine which IV iron she can have.  We will plan to see her again in another 8 weeks.   All questions were answered and she is in agreement with the plan. She can contact our office with any questions or concerns. We can certainly see her sooner if needed.  She was discussed with and also seen by Dr. Myna Hidalgo and he is in agreement with the aforementioned.   Emeline Gins, NP  ADDENDUM: I saw and examined Ms. Garlock with Maralyn Sago.  She is very nice.  She clearly has iron deficiency anemia.  I would get her blood smear under the microscope.  She had microcytic red blood cells.  She had hypochromic red blood cells.  There was some mild anisocytosis.  Her white blood cells looked okay.  I saw no hypersegmented polys.  She had normal platelets that were well granulated.  Clearly, her menstrual cycles are the problem with her iron loss.  She has heavy monthly cycles.  She needs to have an IUD placed by her gynecologist.  However, she needs to have cardiac clearance first.  She's been having some chest pain.  She thinks this is probably from just being anemic with iron deficiency.  We will have to see what her iron studies look like.  We will then had to dose the amount of iron that she will need.  We spent about 45 minutes with her.  She is very nice.  She was fine to talk to.  I am sure that we can get the anemia resolved and get her iron levels back to where they need to be.  Christin Bach, MD

## 2019-10-01 ENCOUNTER — Other Ambulatory Visit: Payer: Self-pay

## 2019-10-01 ENCOUNTER — Encounter (INDEPENDENT_AMBULATORY_CARE_PROVIDER_SITE_OTHER): Payer: Self-pay | Admitting: Family Medicine

## 2019-10-01 ENCOUNTER — Ambulatory Visit (INDEPENDENT_AMBULATORY_CARE_PROVIDER_SITE_OTHER): Payer: Self-pay | Admitting: Family Medicine

## 2019-10-01 VITALS — BP 90/61 | HR 104 | Temp 98.7°F | Ht 67.0 in | Wt 369.0 lb

## 2019-10-01 DIAGNOSIS — E559 Vitamin D deficiency, unspecified: Secondary | ICD-10-CM

## 2019-10-01 DIAGNOSIS — D649 Anemia, unspecified: Secondary | ICD-10-CM

## 2019-10-01 DIAGNOSIS — F418 Other specified anxiety disorders: Secondary | ICD-10-CM

## 2019-10-01 DIAGNOSIS — Z6841 Body Mass Index (BMI) 40.0 and over, adult: Secondary | ICD-10-CM

## 2019-10-01 DIAGNOSIS — G8929 Other chronic pain: Secondary | ICD-10-CM

## 2019-10-01 LAB — IRON AND TIBC
Iron: 27 ug/dL — ABNORMAL LOW (ref 41–142)
Saturation Ratios: 6 % — ABNORMAL LOW (ref 21–57)
TIBC: 423 ug/dL (ref 236–444)
UIBC: 396 ug/dL — ABNORMAL HIGH (ref 120–384)

## 2019-10-01 LAB — ERYTHROPOIETIN: Erythropoietin: 127.4 m[IU]/mL — ABNORMAL HIGH (ref 2.6–18.5)

## 2019-10-01 LAB — FERRITIN: Ferritin: 6 ng/mL — ABNORMAL LOW (ref 11–307)

## 2019-10-02 ENCOUNTER — Telehealth: Payer: Self-pay

## 2019-10-02 NOTE — Telephone Encounter (Signed)
-----   Message from Thomasene Ripple, DO sent at 10/02/2019  8:25 AM EDT ----- Please let the patient know that I received the monitor result- I can see her earlier than 7/23 in [place] if she is willing to come here.

## 2019-10-02 NOTE — Telephone Encounter (Signed)
Spoke with patient regarding results and recommendation. Patient and I together rescheduled her to be seen on 10/06/19 in [place].   Patient verbalizes understanding and is agreeable to plan of care. Advised patient to call back with any issues or concerns.

## 2019-10-05 DIAGNOSIS — F4323 Adjustment disorder with mixed anxiety and depressed mood: Secondary | ICD-10-CM | POA: Diagnosis not present

## 2019-10-06 ENCOUNTER — Other Ambulatory Visit: Payer: Self-pay

## 2019-10-06 ENCOUNTER — Ambulatory Visit: Payer: BC Managed Care – PPO | Admitting: Cardiology

## 2019-10-06 ENCOUNTER — Encounter: Payer: Self-pay | Admitting: Cardiology

## 2019-10-06 VITALS — BP 118/70 | HR 96 | Ht 67.0 in | Wt 368.6 lb

## 2019-10-06 DIAGNOSIS — I472 Ventricular tachycardia: Secondary | ICD-10-CM

## 2019-10-06 DIAGNOSIS — R000 Tachycardia, unspecified: Secondary | ICD-10-CM

## 2019-10-06 DIAGNOSIS — R0602 Shortness of breath: Secondary | ICD-10-CM | POA: Diagnosis not present

## 2019-10-06 DIAGNOSIS — R079 Chest pain, unspecified: Secondary | ICD-10-CM | POA: Diagnosis not present

## 2019-10-06 DIAGNOSIS — I10 Essential (primary) hypertension: Secondary | ICD-10-CM

## 2019-10-06 DIAGNOSIS — I4729 Other ventricular tachycardia: Secondary | ICD-10-CM

## 2019-10-06 MED ORDER — METOPROLOL TARTRATE 100 MG PO TABS
100.0000 mg | ORAL_TABLET | Freq: Once | ORAL | 0 refills | Status: DC
Start: 2019-10-06 — End: 2019-11-11

## 2019-10-06 MED ORDER — PROPRANOLOL HCL 20 MG PO TABS: 20.0000 mg | ORAL_TABLET | Freq: Every day | ORAL | 3 refills | Status: DC

## 2019-10-06 NOTE — Patient Instructions (Addendum)
Medication Instructions:  Your physician has recommended you make the following change in your medication:  START: Propranolol 20 mg take one tablet by mouth daily.  *If you need a refill on your cardiac medications before your next appointment, please call your pharmacy*   Lab Work: Your physician recommends that you return for lab work in: Within one week of your cardiac CT BMP If you have labs (blood work) drawn today and your tests are completely normal, you will receive your results only by: . MyChart Message (if you have MyChart) OR . A paper copy in the mail If you have any lab test that is abnormal or we need to change your treatment, we will call you to review the results.   Testing/Procedures: Your cardiac CT will be scheduled at the below location:   [facility] [address] [place], [place] [address] [phone]   If scheduled at [facility], please arrive at the North Tower main entrance of [facility] 30 minutes prior to test start time. Proceed to the [facility] (first floor) to check-in and test prep.  Please follow these instructions carefully (unless otherwise directed):  On the Night Before the Test: . Be sure to Drink plenty of water. . Do not consume any caffeinated/decaffeinated beverages or chocolate 12 hours prior to your test. . Do not take any antihistamines 12 hours prior to your test.  On the Day of the Test: . Drink plenty of water. Do not drink any water within one hour of the test. . Do not eat any food 4 hours prior to the test. . You may take your regular medications prior to the test.  . Take metoprolol (Lopressor) two hours prior to test. . FEMALES- please wear underwire-free bra if available       After the Test: . Drink plenty of water. . After receiving IV contrast, you may experience a mild flushed feeling. This is normal. . On occasion, you may experience a mild rash up to 24  hours after the test. This is not dangerous. If this occurs, you can take Benadryl 25 mg and increase your fluid intake. . If you experience trouble breathing, this can be serious. If it is severe call 911 IMMEDIATELY. If it is mild, please call our office. . If you take any of these medications: Glipizide/Metformin, Avandament, Glucavance, please do not take 48 hours after completing test unless otherwise instructed.   Once we have confirmed authorization from your insurance company, we will call you to set up a date and time for your test. Based on how quickly your insurance processes prior authorizations requests, please allow up to 4 weeks to be contacted for scheduling your Cardiac CT appointment. Be advised that routine Cardiac CT appointments could be scheduled as many as 8 weeks after your provider has ordered it.  For non-scheduling related questions, please contact the cardiac imaging nurse navigator should you have any questions/concerns: Sara Wallace, Cardiac Imaging Nurse Navigator Merle Tai, Interim Cardiac Imaging Nurse Navigator [facility] Direct Office Dial: [phone]   For scheduling needs, including cancellations and rescheduling, please call Toni at [phone], option [phone].      Follow-Up: At [facility], you and your health needs are our priority.  As part of our continuing mission to provide you with exceptional heart care, we have created designated Provider Care Teams.  These Care Teams include your primary Cardiologist (physician) and Advanced Practice Providers (APPs -    Physician Assistants and Nurse Practitioners) who all work together to provide you with the care you need, when you need it.  We recommend signing up for the patient portal called "MyChart".  Sign up information is provided on this After Visit Summary.  MyChart is used to connect with patients for Virtual Visits (Telemedicine).  Patients are able to view lab/test results,  encounter notes, upcoming appointments, etc.  Non-urgent messages can be sent to your provider as well.   To learn more about what you can do with MyChart, go to [email or web address].    Your next appointment:   2 month(s)  The format for your next appointment:   In Person  Provider:   Berniece Salines, DO   Other Instructions

## 2019-10-06 NOTE — Progress Notes (Signed)
Cardiology Office Note:    Date:  10/06/2019   ID:  Margaret Golden, DOB 14-Jun-1990, MRN [id number]  PCP:  Debbrah Alar, NP  Cardiologist:  Berniece Salines, DO  Electrophysiologist:  None   Referring MD: Debbrah Alar, NP   " I am having chest pain "  History of Present Illness:    Margaret Golden is a 29 y.o. female with a hx of hypertension, morbid obesity, anemia who presents today to discuss her ZIO monitor results.  I did see the patient on Aug 26, 2019 at that time she complained of chest pain palpitations and shortness of breath.  She also complained of chest pain which sounded atypical.  I recommended she wear a ZIO monitor as well as get an echocardiogram.  In the interim she was able to get this testing done.  Her echocardiogram was normal however her ZIO monitor does show evidence of nonsustained ventricular tachycardia.  She is here today to discuss the ZIO monitor results.  She tells me today that her chest pain characteristics has changed.  She says now she feels as if she is being constricted by a snake or an elephant is sitting on her chest when she experienced the chest pains.  Diffuse more pressure-like now.  However it is midsternal without any radiation.  She does have associated shortness of breath but she is short of breath at baseline.  Past Medical History:  Diagnosis Date  . Acid reflux   . ADD (attention deficit disorder)   . Anemia   . Anxiety   . Arthritis    left ankle  . Back pain   . Chest pain   . Depression   . Dyspnea   . Exercise-induced asthma   . Fatty liver   . History of blood transfusion   . History of chicken pox   . History of kidney stones   . Hypothyroidism   . Joint pain   . Lower extremity edema   . Major depressive disorder   . Migraines    chronic  . Pneumonia   . RLS (restless legs syndrome)   . Thyroid disease    hypothyroid    Past Surgical History:  Procedure Laterality Date  . KNEE ARTHROSCOPY Right 2007    . TONSILLECTOMY  2010  . WISDOM TOOTH EXTRACTION      Current Medications: Current Meds  Medication Sig  . albuterol (VENTOLIN HFA) 108 (90 Base) MCG/ACT inhaler Inhale 2 puffs into the lungs every 6 (six) hours as needed for wheezing or shortness of breath (Chest tightness).  Marland Kitchen aspirin-acetaminophen-caffeine (EXCEDRIN MIGRAINE) 250-250-65 MG tablet Take 2 tablets by mouth every 6 (six) hours as needed for headache.   . Iron-FA-B Cmp-C-Biot-Probiotic (FUSION PLUS) CAPS One capsule twice daily with orange juice. Stop your Ferrous sulfate.  . levothyroxine (SYNTHROID) 100 MCG tablet Take 1 tablet (100 mcg total) by mouth daily.  Marland Kitchen levothyroxine (SYNTHROID) 112 MCG tablet Take 1 tablet (112 mcg total) by mouth daily.  . Multiple Vitamins-Minerals (MULTIVITAMIN WITH MINERALS) tablet Take 1 tablet by mouth daily.  Marland Kitchen omeprazole (PRILOSEC) 20 MG capsule Take 2 capsules (40 mg total) by mouth daily.  Marland Kitchen VITAMIN D PO Take 50 mcg by mouth daily.   . Vitamin D, Ergocalciferol, (DRISDOL) 1.25 MG (50000 UNIT) CAPS capsule Take 1 capsule (50,000 Units total) by mouth every 7 (seven) days.     Allergies:   Augmentin [amoxicillin-pot clavulanate]   Social History   Socioeconomic History  .  Marital status: Single    Spouse name: Not on file  . Number of children: 0  . Years of education: 12  . Highest education level: Not on file  Occupational History  . Occupation: IT sales professional  Tobacco Use  . Smoking status: Never Smoker  . Smokeless tobacco: Never Used  Vaping Use  . Vaping Use: Never used  Substance and Sexual Activity  . Alcohol use: Not Currently    Alcohol/week: 0.0 standard drinks  . Drug use: No  . Sexual activity: Never    Comment: never sexually active  Other Topics Concern  . Not on file  Social History Narrative   Unemployed   Lives with Dad   Seeking work   Only child   Dog 3   Lives in a one story house with father, has a cat-12/26/16-sjb   Social  Determinants of Radio broadcast assistant Strain:   . Difficulty of Paying Living Expenses:   Food Insecurity:   . Worried About Charity fundraiser in the Last Year:   . Arboriculturist in the Last Year:   Transportation Needs:   . Film/video editor (Medical):   Marland Kitchen Lack of Transportation (Non-Medical):   Physical Activity:   . Days of Exercise per Week:   . Minutes of Exercise per Session:   Stress:   . Feeling of Stress :   Social Connections:   . Frequency of Communication with Friends and Family:   . Frequency of Social Gatherings with Friends and Family:   . Attends Religious Services:   . Active Member of Clubs or Organizations:   . Attends Archivist Meetings:   Marland Kitchen Marital Status:      Family History: The patient's family history includes Arthritis in her maternal grandfather, maternal grandmother, paternal grandfather, and paternal grandmother; Breast cancer in her maternal grandmother; Cancer in her paternal aunt and paternal uncle; Depression in her father and mother; Diabetes in her maternal grandfather; Obesity in her father and mother; Sleep apnea in her father.  ROS:   Review of Systems  Constitution: Negative for decreased appetite, fever and weight gain.  HENT: Negative for congestion, ear discharge, hoarse voice and sore throat.   Eyes: Negative for discharge, redness, vision loss in right eye and visual halos.  Cardiovascular: Negative for chest pain, dyspnea on exertion, leg swelling, orthopnea and palpitations.  Respiratory: Negative for cough, hemoptysis, shortness of breath and snoring.   Endocrine: Negative for heat intolerance and polyphagia.  Hematologic/Lymphatic: Negative for bleeding problem. Does not bruise/bleed easily.  Skin: Negative for flushing, nail changes, rash and suspicious lesions.  Musculoskeletal: Negative for arthritis, joint pain, muscle cramps, myalgias, neck pain and stiffness.  Gastrointestinal: Negative for abdominal  pain, bowel incontinence, diarrhea and excessive appetite.  Genitourinary: Negative for decreased libido, genital sores and incomplete emptying.  Neurological: Negative for brief paralysis, focal weakness, headaches and loss of balance.  Psychiatric/Behavioral: Negative for altered mental status, depression and suicidal ideas.  Allergic/Immunologic: Negative for HIV exposure and persistent infections.    EKGs/Labs/Other Studies Reviewed:    The following studies were reviewed today:   EKG: None today  Echocardiogram September 14, 2019 1. Left ventricular ejection fraction, by estimation, is 60 to 65%. The  left ventricle has normal function. The left ventricle has no regional  wall motion abnormalities. Left ventricular diastolic parameters were  normal.  2. Right ventricular systolic function is normal. The right ventricular  size is normal.  3.  The mitral valve is normal in structure. Trivial mitral valve  regurgitation. No evidence of mitral stenosis.  4. The aortic valve is normal in structure. Aortic valve regurgitation is  not visualized. No aortic stenosis is present.  5. The inferior vena cava is normal in size with greater than 50%  respiratory variability, suggesting right atrial pressure of 3 mmHg.   ZIO monitor The patient wore the monitor for 7 days 8 hours starting Aug 26, 2019. Indication: Palpitations  The minimum heart rate was 62 bpm, maximum heart rate was 207 bpm, and average heart rate was 94 bpm. Predominant underlying rhythm was Sinus Rhythm.   2 Ventricular Tachycardia runs occurred, the run with the fastest interval lasting 4 beats with a max rate of 207 bpm, the longest lasting 4 beats with an avg rate of 117 bpm.   Premature atrial complexes were rare (<1.0%).   Premature Ventricular complexes were rare (<1.0%).  No supraventricular tachycardia, No  pauses, No AV block and no atrial fibrillation present.  Patient triggered events was 27 and  diary events 9 all associated with sinus rhythm.   Conclusion: This study is remarkable for Nonsustained ventricular tachycardia.  Recent Labs: 09/03/2019: TSH 18.300 09/30/2019: ALT 14; BUN 8; Creatinine 0.74; Hemoglobin 9.1; Platelet Count 214; Potassium 3.9; Sodium 139  Recent Lipid Panel    Component Value Date/Time   CHOL 150 09/03/2019 1503   TRIG 120 09/03/2019 1503   HDL 39 (L) 09/03/2019 1503   CHOLHDL 4 07/21/2019 1139   VLDL 36.6 07/21/2019 1139   LDLCALC 89 09/03/2019 1503    Physical Exam:    VS:  BP 118/70 (BP Location: Right Arm, Patient Position: Sitting, Cuff Size: Normal)   Pulse 96   Ht '5\' 7"'  (1.702 m)   Wt (!) 368 lb 9.6 oz (167.2 kg)   LMP 09/16/2019 (Exact Date)   SpO2 99%   BMI 57.73 kg/m     Wt Readings from Last 3 Encounters:  10/06/19 (!) 368 lb 9.6 oz (167.2 kg)  10/01/19 (!) 369 lb (167.4 kg)  09/30/19 (!) 371 lb (168.3 kg)     GEN: Well nourished, well developed in no acute distress HEENT: Normal NECK: No JVD; No carotid bruits LYMPHATICS: No lymphadenopathy CARDIAC: S1S2 noted,RRR, no murmurs, rubs, gallops RESPIRATORY:  Clear to auscultation without rales, wheezing or rhonchi  ABDOMEN: Soft, non-tender, non-distended, +bowel sounds, no guarding. EXTREMITIES: No edema, No cyanosis, no clubbing MUSCULOSKELETAL:  No deformity  SKIN: Warm and dry NEUROLOGIC:  Alert and oriented x 3, non-focal PSYCHIATRIC:  Normal affect, good insight  ASSESSMENT:    1. Chest pain of uncertain etiology   2. NSVT (nonsustained ventricular tachycardia) ([place])   3. SOB (shortness of breath)   4. Sinus tachycardia   5. Essential hypertension    PLAN:    Given the change in characteristics of her chest pain as well as nonsustained ventricular tachycardia found on her monitor I like to pursue an ischemic evaluation on this patient.  She is a great candidate for coronary CTA.  She has no IV dye allergy therefore she is agreeable to proceed.  Educated patient  about the testing.  She did have symptomatic persistent sinus tachycardia which likely is in the setting of her underlying anxiety.  But with her NSVT I am going to start the patient propranolol 20 mg daily.  Hypertension her blood pressure is acceptable in the office today.  Morbid obesity-the patient understands the need to lose weight with diet  and exercise. We have discussed specific strategies for this.  She is pending IUD placement as well as D&C.  This is a low risk surgery she should be able to have this procedure done.  The patient is in agreement with the above plan. The patient left the office in stable condition.  The patient will follow up in 3 months or sooner if needed.   Medication Adjustments/Labs and Tests Ordered: Current medicines are reviewed at length with the patient today.  Concerns regarding medicines are outlined above.  Orders Placed This Encounter  Procedures  . CT CORONARY MORPH W/CTA COR W/SCORE W/CA W/CM &/OR WO/CM  . CT CORONARY FRACTIONAL FLOW RESERVE DATA PREP  . CT CORONARY FRACTIONAL FLOW RESERVE FLUID ANALYSIS  . Basic Metabolic Panel (BMET)   Meds ordered this encounter  Medications  . propranolol (INDERAL) 20 MG tablet    Sig: Take 1 tablet (20 mg total) by mouth daily.    Dispense:  90 tablet    Refill:  3  . metoprolol tartrate (LOPRESSOR) 100 MG tablet    Sig: Take 1 tablet (100 mg total) by mouth once for 1 dose. Please take two hours prior to your cardiac CT    Dispense:  1 tablet    Refill:  0    Patient Instructions  Medication Instructions:  Your physician has recommended you make the following change in your medication:  START: Propranolol 20 mg take one tablet by mouth daily.  *If you need a refill on your cardiac medications before your next appointment, please call your pharmacy*   Lab Work: Your physician recommends that you return for lab work in: Within one week of your cardiac CT BMP If you have labs (blood work) drawn  today and your tests are completely normal, you will receive your results only by: Marland Kitchen MyChart Message (if you have MyChart) OR . A paper copy in the mail If you have any lab test that is abnormal or we need to change your treatment, we will call you to review the results.   Testing/Procedures: Your cardiac CT will be scheduled at the below location:   [facility] [address] [place], [place] [address] [phone]   If scheduled at [facility], please arrive at the [facility] main entrance of [facility] 30 minutes prior to test start time. Proceed to the [facility] Radiology Department (first floor) to check-in and test prep.  Please follow these instructions carefully (unless otherwise directed):  On the Night Before the Test: . Be sure to Drink plenty of water. . Do not consume any caffeinated/decaffeinated beverages or chocolate 12 hours prior to your test. . Do not take any antihistamines 12 hours prior to your test.  On the Day of the Test: . Drink plenty of water. Do not drink any water within one hour of the test. . Do not eat any food 4 hours prior to the test. . You may take your regular medications prior to the test.  . Take metoprolol (Lopressor) two hours prior to test. . FEMALES- please wear underwire-free bra if available       After the Test: . Drink plenty of water. . After receiving IV contrast, you may experience a mild flushed feeling. This is normal. . On occasion, you may experience a mild rash up to 24 hours after the test. This is not dangerous. If this occurs, you can take Benadryl 25 mg and increase your fluid intake. Marland Kitchen  If you experience trouble breathing, this can be serious. If it is severe call 911 IMMEDIATELY. If it is mild, please call our office. . If you take any of these medications: Glipizide/Metformin, Avandament, Glucavance, please do not take 48 hours after completing test unless otherwise  instructed.   Once we have confirmed authorization from your insurance company, we will call you to set up a date and time for your test. Based on how quickly your insurance processes prior authorizations requests, please allow up to 4 weeks to be contacted for scheduling your Cardiac CT appointment. Be advised that routine Cardiac CT appointments could be scheduled as many as 8 weeks after your provider has ordered it.  For non-scheduling related questions, please contact the cardiac imaging nurse navigator should you have any questions/concerns: Marchia Bond, Cardiac Imaging Nurse Navigator Burley Saver, Interim Cardiac Imaging Nurse [place] [facility] Direct Office Dial: [phone]   For scheduling needs, including cancellations and rescheduling, please call Vivien Rota at [phone], option 3.      Follow-Up: At [facility], you and your health needs are our priority.  As part of our continuing mission to provide you with exceptional heart care, we have created designated Provider Care Teams.  These Care Teams include your primary Cardiologist (physician) and Advanced Practice Providers (APPs -  Physician Assistants and Nurse Practitioners) who all work together to provide you with the care you need, when you need it.  We recommend signing up for the patient portal called "MyChart".  Sign up information is provided on this After Visit Summary.  MyChart is used to connect with patients for Virtual Visits (Telemedicine).  Patients are able to view lab/test results, encounter notes, upcoming appointments, etc.  Non-urgent messages can be sent to your provider as well.   To learn more about what you can do with MyChart, go to [email or web address].    Your next appointment:   2 month(s)  The format for your next appointment:   In Person  Provider:   Berniece Salines, DO   Other Instructions      Adopting a Healthy Lifestyle.  Know what a healthy weight  is for you (roughly BMI <25) and aim to maintain this   Aim for 7+ servings of fruits and vegetables daily   65-80+ fluid ounces of water or unsweet tea for healthy kidneys   Limit to max 1 drink of alcohol per day; avoid smoking/tobacco   Limit animal fats in diet for cholesterol and heart health - choose grass fed whenever available   Avoid highly processed foods, and foods high in saturated/trans fats   Aim for low stress - take time to unwind and care for your mental health   Aim for 150 min of moderate intensity exercise weekly for heart health, and weights twice weekly for bone health   Aim for 7-9 hours of sleep daily   When it comes to diets, agreement about the perfect plan isnt easy to find, even among the experts. Experts at the [employer] developed an idea known as the Healthy Eating Plate. Just imagine a plate divided into logical, healthy portions.   The emphasis is on diet quality:   Load up on vegetables and fruits - one-half of your plate: Aim for color and variety, and remember that potatoes dont count.   Go for whole grains - one-quarter of your plate: Whole wheat, barley, wheat berries, quinoa, oats, brown rice, and foods made with them. If  you want pasta, go with whole wheat pasta.   Protein power - one-quarter of your plate: Fish, chicken, beans, and nuts are all healthy, versatile protein sources. Limit red meat.   The diet, however, does go beyond the plate, offering a few other suggestions.   Use healthy plant oils, such as olive, canola, soy, corn, sunflower and peanut. Check the labels, and avoid partially hydrogenated oil, which have unhealthy trans fats.   If youre thirsty, drink water. Coffee and tea are good in moderation, but skip sugary drinks and limit milk and dairy products to one or two daily servings.   The type of carbohydrate in the diet is more important than the amount. Some sources of carbohydrates, such as  vegetables, fruits, whole grains, and beans-are healthier than others.   Finally, stay active  Signed, Berniece Salines, DO  10/06/2019 4:03 PM    [facility]

## 2019-10-07 DIAGNOSIS — F4323 Adjustment disorder with mixed anxiety and depressed mood: Secondary | ICD-10-CM | POA: Diagnosis not present

## 2019-10-07 NOTE — Progress Notes (Signed)
Chief Complaint:   OBESITY Margaret Golden is here to discuss her progress with her obesity treatment plan along with follow-up of her obesity related diagnoses. Margaret Golden is on the Category 3 Plan and states she is following her eating plan approximately 50% of the time. Margaret Golden states she is waling and doing shopping/errands 4 times per week.  Today's visit was #: 3 Starting weight: 367 lbs Starting date: 09/03/2019 Today's weight: 369 lbs Today's date: 10/01/2019 Total lbs lost to date: 0 lbs Total lbs lost since last in-office visit: 0  Interim History: Margaret Golden is anemic and has been seeing various doctors for it.  She was recently seen by Heme/Onc.  She does not eat in the morning and is going "way over on my snacking".  Chips, crackers, pretzels.  She uses peanut butter and cheese.  She is not getting in all her protein and is not weighing her food.  Subjective:   1. Vitamin D deficiency Margaret Golden's Vitamin D level was 16.1 on 09/03/2019. She is currently taking prescription vitamin D 50,000 IU each week. She denies nausea, vomiting or muscle weakness.  2. Anemia, unspecified type Margaret Golden is not a vegetarian.  She does not have a history of weight loss surgery.   CBC Latest Ref Rng & Units 09/30/2019 08/26/2019 08/12/2019  WBC 4.0 - 10.5 K/uL 7.5 8.4 10.3  Hemoglobin 12.0 - 15.0 g/dL 6.6(M) 6.0(O) 10.6(L)  Hematocrit 36 - 46 % 32.7(L) 33.4(L) 38.3  Platelets 150 - 400 K/uL 214 234 249   Lab Results  Component Value Date   IRON 27 (L) 09/30/2019   TIBC 423 09/30/2019   FERRITIN 6 (L) 09/30/2019   Lab Results  Component Value Date   VITAMINB12 473 09/03/2019   3. Other chronic pain, arthritis Margaret Golden has been seen by Orthopedics in the past.  She has had increased symptoms lately and it is hard for her to stand/move at times.  4. Depression with anxiety She has been seen by Dr. Dewaine Conger twice now.  She enjoys it and finds it helpful.  Assessment/Plan:   1. Vitamin D deficiency Low  Vitamin D level contributes to fatigue and are associated with obesity, breast, and colon cancer. She agrees to continue to take prescription Vitamin D @50 ,000 IU every week and will follow-up for routine testing of Vitamin D, at least 2-3 times per year to avoid over-replacement.  2. Anemia, unspecified type Follow-up with specialists (Heme).  She will be getting an iron transfusion soon.  Orders and follow up as documented in patient record.  Counseling . Iron is essential for our bodies to make red blood cells.  Reasons that someone may be deficient include: an iron-deficient diet (more likely in those following vegan or vegetarian diets), women with heavy menses, patients with GI disorders or poor absorption, patients that have had bariatric surgery, frequent blood donors, patients with cancer, and patients with heart disease.   foods include dark leafy greens, red and white meats, eggs, seafood, and beans.   . Certain foods and drinks prevent your body from absorbing iron properly. Avoid eating these foods in the same meal as iron-rich foods or with iron supplements. These foods include: coffee, black tea, and red wine; milk, dairy products, and foods that are high in calcium; beans and soybeans; whole grains.  . Constipation can be a side effect of iron supplementation. Increased water and fiber intake are helpful. Water goal: > 2 liters/day. Fiber goal: > 25 grams/day.  3. Other  chronic pain, arthritis Drink more water.  She will follow-up with her specialist regarding further management.  Increase walking slowly as tolerated.  Prudent nutritional plan.  4. Depression with anxiety Behavior modification techniques were discussed today to help Margaret Golden deal with her emotional/non-hunger eating behaviors.  Orders and follow up as documented in patient record.   5. Class 3 severe obesity with serious comorbidity and body mass index (BMI) of 50.0 to 59.9 in adult, unspecified obesity  type (HCC) Margaret Golden is currently in the action stage of change. As such, her goal is to continue with weight loss efforts. She has agreed to the Category 3 Plan.   Exercise goals: As is.  Behavioral modification strategies: increasing lean protein intake, decreasing simple carbohydrates, increasing water intake and decreasing liquid calories.  Her number one goal is that whenever she falls of the plan, she is to be more mindful of calories/nutrients she takes in and will write them in a notebook/journal.  She will bring in the journal to her next office visit.  Her second goal is to drink more water.  She drinks 2 soda stream sodas per day and only two 16 ounce bottles of water per day.  She will drink 4 bottles of water per day.  Margaret Golden has agreed to follow-up with our clinic in 2 weeks. She was informed of the importance of frequent follow-up visits to maximize her success with intensive lifestyle modifications for her multiple health conditions.   Objective:   Blood pressure 90/61, pulse (!) 104, temperature 98.7 F (37.1 C), temperature source Oral, height 5\' 7"  (1.702 m), weight (!) 369 lb (167.4 kg), last menstrual period 09/16/2019, SpO2 98 %. Body mass index is 57.79 kg/m.  General: Cooperative, alert, well developed, in no acute distress. HEENT: Conjunctivae and lids unremarkable. Cardiovascular: Regular rhythm.  Lungs: Normal work of breathing. Neurologic: No focal deficits.   Lab Results  Component Value Date   CREATININE 0.74 09/30/2019   BUN 8 09/30/2019   NA 139 09/30/2019   K 3.9 09/30/2019   CL 107 09/30/2019   CO2 25 09/30/2019   Lab Results  Component Value Date   ALT 14 09/30/2019   AST 27 09/30/2019   ALKPHOS 69 09/30/2019   BILITOT 0.4 09/30/2019   Lab Results  Component Value Date   HGBA1C 4.9 09/03/2019   HGBA1C 5.0 07/30/2019   HGBA1C 4.6 07/06/2016   Lab Results  Component Value Date   INSULIN 29.2 (H) 09/03/2019   Lab Results  Component  Value Date   TSH 18.300 (H) 09/03/2019   Lab Results  Component Value Date   CHOL 150 09/03/2019   HDL 39 (L) 09/03/2019   LDLCALC 89 09/03/2019   TRIG 120 09/03/2019   CHOLHDL 4 07/21/2019   Lab Results  Component Value Date   WBC 7.5 09/30/2019   HGB 9.1 (L) 09/30/2019   HCT 32.7 (L) 09/30/2019   MCV 73.0 (L) 09/30/2019   PLT 214 09/30/2019   Lab Results  Component Value Date   IRON 27 (L) 09/30/2019   TIBC 423 09/30/2019   FERRITIN 6 (L) 09/30/2019   Attestation Statements:   Reviewed by clinician on day of visit: allergies, medications, problem list, medical history, surgical history, family history, social history, and previous encounter notes.  Time spent on visit including pre-visit chart review and post-visit care and charting was 31 minutes.   I, 10/02/2019, CMA, am acting as Insurance claims handler for Energy manager, DO.  I have reviewed the above  documentation for accuracy and completeness, and I agree with the above. Thomasene Lot, DO

## 2019-10-12 ENCOUNTER — Other Ambulatory Visit: Payer: Self-pay

## 2019-10-12 ENCOUNTER — Telehealth (INDEPENDENT_AMBULATORY_CARE_PROVIDER_SITE_OTHER): Payer: BC Managed Care – PPO | Admitting: Psychology

## 2019-10-12 DIAGNOSIS — F330 Major depressive disorder, recurrent, mild: Secondary | ICD-10-CM

## 2019-10-12 NOTE — Progress Notes (Unsigned)
Office: [phone]  /  Fax: [phone]    Date: October 26, 2019   Appointment Start Time: *** Duration: *** minutes Provider: Lawerance Cruel, Psy.D. Type of Session: Individual Therapy  Location of Patient: {gbptloc:23249} Location of Provider: {Location of Service:22491} Type of Contact: Telepsychological Visit via MyChart Video Visit  Session Content: This provider called Margaret Golden at 10:32am as she did not present for the telepsychological appointment. *** As such, today's appointment was initiated *** minutes late.  Margaret Golden is a 29 y.o. female presenting via MyChart Video Visit for a follow-up appointment to address the previously established treatment goal of increasing coping skills. Today's appointment was a telepsychological visit due to COVID-19. Margaret Golden provided verbal consent for today's telepsychological appointment and she is aware she is responsible for securing confidentiality on her end of the session. Prior to proceeding with today's appointment, Margaret Golden's physical location at the time of this appointment was obtained as well a phone number she could be reached at in the event of technical difficulties. Margaret Golden and this provider participated in today's telepsychological service.   This provider conducted a brief check-in and verbally administered the PHQ-9 and GAD-7. ***Psychoeducation regarding triggers for emotional eating was provided. Margaret Golden was provided a handout, and encouraged to utilize the handout between now and the next appointment to increase awareness of triggers and frequency. Margaret Golden agreed. This provider also discussed behavioral strategies for specific triggers, such as placing the utensil down when conversing to avoid mindless eating. Margaret Golden provided verbal consent during today's appointment for this provider to send *** via e-mail.  Margaret Golden was receptive to today's appointment as evidenced by openness to sharing, responsiveness to feedback, and  {gbreceptiveness:23401}.  Mental Status Examination:  Appearance: {Appearance:22431} Behavior: {Behavior:22445} Mood: {gbmood:21757} Affect: {Affect:22436} Speech: {Speech:22432} Eye Contact: {Eye Contact:22433} Psychomotor Activity: {Motor Activity:22434} Gait: {gbgait:23404} Thought Process: {thought process:22448}  Thought Content/Perception: {disturbances:22451} Orientation: {Orientation:22437} Memory/Concentration: {gbcognition:22449} Insight/Judgment: {Insight:22446}  Structured Assessments Results: The Patient Health Questionnaire-9 (PHQ-9) is a self-report measure that assesses symptoms and severity of depression over the course of the last two weeks. Margaret Golden obtained a score of *** suggesting {GBPHQ9SEVERITY:21752}. Margaret Golden finds the endorsed symptoms to be {gbphq9difficulty:21754}. [0= Not at all; 1= Several days; 2= More than half the days; 3= Nearly every day] Little interest or pleasure in doing things ***  Feeling down, depressed, or hopeless ***  Trouble falling or staying asleep, or sleeping too much ***  Feeling tired or having little energy ***  Poor appetite or overeating ***  Feeling bad about yourself --- or that you are a failure or have let yourself or your family down ***  Trouble concentrating on things, such as reading the newspaper or watching television ***  Moving or speaking so slowly that other people could have noticed? Or the opposite --- being so fidgety or restless that you have been moving around a lot more than usual ***  Thoughts that you would be better off dead or hurting yourself in some way ***  PHQ-9 Score ***    The Generalized Anxiety Disorder-7 (GAD-7) is a brief self-report measure that assesses symptoms of anxiety over the course of the last two weeks. Margaret Golden obtained a score of *** suggesting {gbgad7severity:21753}. Margaret Golden finds the endorsed symptoms to be {gbphq9difficulty:21754}. [0= Not at all; 1= Several days; 2= Over half the days; 3=  Nearly every day] Feeling nervous, anxious, on edge ***  Not being able to stop or control worrying ***  Worrying too much about different things ***  Trouble relaxing ***  Being so restless that it's hard to sit still ***  Becoming easily annoyed or irritable ***  Feeling afraid as if something awful might happen ***  GAD-7 Score ***   Interventions:  {Interventions for Progress Notes:23405}  DSM-5 Diagnosis(es): 296.31 (F33.0) Major Depressive Disorder, Recurrent Episode, Mild, With Anxious Distress  Treatment Goal & Progress: During the initial appointment with this provider, the following treatment goal was established: increase coping skills. Demetrice has demonstrated progress in her goal as evidenced by {gbtxprogress:22839}. Chakira also {gbtxprogress2:22951}.  Plan: The next appointment will be scheduled in {gbweeks:21758}, which will be {gbtxmodality:23402}. The next session will focus on {Plan for Next Appointment:23400}.

## 2019-10-14 ENCOUNTER — Ambulatory Visit (INDEPENDENT_AMBULATORY_CARE_PROVIDER_SITE_OTHER): Payer: BC Managed Care – PPO | Admitting: Family Medicine

## 2019-10-14 ENCOUNTER — Encounter (INDEPENDENT_AMBULATORY_CARE_PROVIDER_SITE_OTHER): Payer: Self-pay | Admitting: Family Medicine

## 2019-10-14 ENCOUNTER — Other Ambulatory Visit: Payer: Self-pay

## 2019-10-14 VITALS — BP 132/80 | HR 97 | Temp 98.3°F | Ht 67.0 in | Wt 365.0 lb

## 2019-10-14 DIAGNOSIS — E559 Vitamin D deficiency, unspecified: Secondary | ICD-10-CM

## 2019-10-14 DIAGNOSIS — F418 Other specified anxiety disorders: Secondary | ICD-10-CM | POA: Diagnosis not present

## 2019-10-14 DIAGNOSIS — Z6841 Body Mass Index (BMI) 40.0 and over, adult: Secondary | ICD-10-CM

## 2019-10-14 DIAGNOSIS — Z9189 Other specified personal risk factors, not elsewhere classified: Secondary | ICD-10-CM | POA: Diagnosis not present

## 2019-10-14 NOTE — Progress Notes (Signed)
Chief Complaint:   OBESITY Margaret Golden is here to discuss her progress with her obesity treatment plan along with follow-up of her obesity related diagnoses. Margaret Golden is on the Category 3 Plan and states she is following her eating plan approximately 0% of the time. Margaret Golden states she is exercising for 0 minutes 0 times per week.  Today's visit was #: 4 Starting weight: 367 lbs Starting date: 09/03/2019 Today's weight: 365 lbs Today's date: 10/14/2019 Total lbs lost to date: 2 lbs Total lbs lost since last in-office visit: 4 lbs  Interim History: Margaret Golden says she has been more mindful of her portion sizes.  She has cut back on her soda intake, and since last office visit, she has switched to diet.  She only drinks calories in [employer] milk.  She brings in a piece of paper today with modified details of what she thinks is reasonable for her to follow for my review.  Subjective:   1. Vitamin D deficiency Margaret Golden's Vitamin D level was 16.1 on 09/03/2019. She is currently taking prescription vitamin D 50,000 IU each week. She denies nausea, vomiting or muscle weakness.  2. Depression with anxiety Margaret Golden is working with Margaret Golden, our psychologist.  Negative for SI.  She has no complaints today.  3. At risk for diabetes mellitus Margaret Golden is at higher than average risk for developing diabetes due to her obesity.   Assessment/Plan:   1. Vitamin D deficiency Low Vitamin D level contributes to fatigue and are associated with obesity, breast, and colon cancer. She agrees to continue to take prescription Vitamin D @50 ,000 IU every week and will follow-up for routine testing of Vitamin D, at least 2-3 times per year to avoid over-replacement.  2. Depression with anxiety Continue medication management per PCP or specialist , NP).  Continue counseling, prudent nutritional plan, weight loss, movement daily to improve mental health.  3. At risk for diabetes mellitus Margaret Golden was given  approximately 15 minutes of diabetes education and counseling today. We discussed intensive lifestyle modifications today with an emphasis on weight loss as well as increasing exercise and decreasing simple carbohydrates in her diet. We also reviewed medication options with an emphasis on risk versus benefit of those discussed.   Repetitive spaced learning was employed today to elicit superior memory formation and behavioral change.  4. Class 3 severe obesity with serious comorbidity and body mass index (BMI) of 50.0 to 59.9 in adult, unspecified obesity type (HCC) Margaret Golden is currently in the action stage of change. As such, her goal is to continue with weight loss efforts. She has agreed to the Category 3 Plan.   Exercise goals: All adults should avoid inactivity. Some physical activity is better than none, and adults who participate in any amount of physical activity gain some health benefits.  Behavioral modification strategies: increasing lean protein intake, decreasing simple carbohydrates, increasing water intake and planning for success.  Margaret Golden has agreed to follow-up with our clinic in 2 weeks. She was informed of the importance of frequent follow-up visits to maximize her success with intensive lifestyle modifications for her multiple health conditions.   Objective:   Blood pressure 132/80, pulse 97, temperature 98.3 F (36.8 C), temperature source Oral, height 5\' 7"  (1.702 m), weight (!) 365 lb (165.6 kg), last menstrual period 09/16/2019, SpO2 99 %. Body mass index is 57.17 kg/m.  General: Cooperative, alert, well developed, in no acute distress. HEENT: Conjunctivae and lids unremarkable. Cardiovascular: Regular rhythm.  Lungs: Normal work of  breathing. Neurologic: No focal deficits.   Lab Results  Component Value Date   CREATININE 0.74 09/30/2019   BUN 8 09/30/2019   NA 139 09/30/2019   K 3.9 09/30/2019   CL 107 09/30/2019   CO2 25 09/30/2019   Lab Results  Component  Value Date   ALT 14 09/30/2019   AST 27 09/30/2019   ALKPHOS 69 09/30/2019   BILITOT 0.4 09/30/2019   Lab Results  Component Value Date   HGBA1C 4.9 09/03/2019   HGBA1C 5.0 07/30/2019   HGBA1C 4.6 07/06/2016   Lab Results  Component Value Date   INSULIN 29.2 (H) 09/03/2019   Lab Results  Component Value Date   TSH 18.300 (H) 09/03/2019   Lab Results  Component Value Date   CHOL 150 09/03/2019   HDL 39 (L) 09/03/2019   LDLCALC 89 09/03/2019   TRIG 120 09/03/2019   CHOLHDL 4 07/21/2019   Lab Results  Component Value Date   WBC 7.5 09/30/2019   HGB 9.1 (L) 09/30/2019   HCT 32.7 (L) 09/30/2019   MCV 73.0 (L) 09/30/2019   PLT 214 09/30/2019   Lab Results  Component Value Date   IRON 27 (L) 09/30/2019   TIBC 423 09/30/2019   FERRITIN 6 (L) 09/30/2019   Attestation Statements:   Reviewed by clinician on day of visit: allergies, medications, problem list, medical history, surgical history, family history, social history, and previous encounter notes.  I, Insurance claims handler, CMA, am acting as Energy manager for [employer], DO.  I have reviewed the above documentation for accuracy and completeness, and I agree with the above. Thomasene Lot, DO

## 2019-10-20 ENCOUNTER — Other Ambulatory Visit: Payer: Self-pay

## 2019-10-20 ENCOUNTER — Ambulatory Visit (INDEPENDENT_AMBULATORY_CARE_PROVIDER_SITE_OTHER): Payer: BC Managed Care – PPO | Admitting: Family

## 2019-10-20 ENCOUNTER — Other Ambulatory Visit: Payer: Self-pay | Admitting: Family

## 2019-10-20 VITALS — BP 118/79 | HR 98 | Temp 98.7°F | Resp 16 | Wt 370.0 lb

## 2019-10-20 DIAGNOSIS — D509 Iron deficiency anemia, unspecified: Secondary | ICD-10-CM | POA: Insufficient documentation

## 2019-10-20 DIAGNOSIS — E039 Hypothyroidism, unspecified: Secondary | ICD-10-CM

## 2019-10-20 DIAGNOSIS — F330 Major depressive disorder, recurrent, mild: Secondary | ICD-10-CM | POA: Diagnosis not present

## 2019-10-20 DIAGNOSIS — R030 Elevated blood-pressure reading, without diagnosis of hypertension: Secondary | ICD-10-CM

## 2019-10-20 DIAGNOSIS — R0789 Other chest pain: Secondary | ICD-10-CM

## 2019-10-20 DIAGNOSIS — D500 Iron deficiency anemia secondary to blood loss (chronic): Secondary | ICD-10-CM | POA: Diagnosis not present

## 2019-10-20 NOTE — Patient Instructions (Signed)
Please restart synthroid daily in the AM. Call Cardiology to inquire about Cardiac CT scheduling.

## 2019-10-20 NOTE — Progress Notes (Signed)
Subjective:    Patient ID: Margaret Golden, female    DOB: 10-23-1990, 29 y.o.   MRN: [id number]  HPI  Patient is a 29 yr old female who presents today for follow up.  Hypothyroid- currently on synthroid 112 mcg.   Lab Results  Component Value Date   TSH 18.300 (H) 09/03/2019   Atypical chest pain- saw cardiology. She reports that the pain is "becoming more constant." Attributes to anemia.  Pain is worst during her menstrual cycle.   Depression- maintained on zoloft and lamictal. She is following with psychiatry.   BP Readings from Last 3 Encounters:  10/20/19 (!) 141/91  10/14/19 132/80  10/06/19 118/70   BP Readings from Last 3 Encounters:  10/20/19 (!) 141/91  10/14/19 132/80  10/06/19 118/70   Lab Results  Component Value Date   WBC 7.5 09/30/2019   HGB 9.1 (L) 09/30/2019   HCT 32.7 (L) 09/30/2019   MCV 73.0 (L) 09/30/2019   PLT 214 09/30/2019    Not exercising.   Wt Readings from Last 3 Encounters:  10/20/19 (!) 370 lb (167.8 kg)  10/14/19 (!) 365 lb (165.6 kg)  10/06/19 (!) 368 lb 9.6 oz (167.2 kg)      Review of Systems    see HPI  Past Medical History:  Diagnosis Date  . Acid reflux   . ADD (attention deficit disorder)   . Anemia   . Anxiety   . Arthritis    left ankle  . Back pain   . Chest pain   . Depression   . Dyspnea   . Exercise-induced asthma   . Fatty liver   . History of blood transfusion   . History of chicken pox   . History of kidney stones   . Hypothyroidism   . Joint pain   . Lower extremity edema   . Major depressive disorder   . Migraines    chronic  . Pneumonia   . RLS (restless legs syndrome)   . Thyroid disease    hypothyroid     Social History   Socioeconomic History  . Marital status: Single    Spouse name: Not on file  . Number of children: 0  . Years of education: 29  . Highest education level: Not on file  Occupational History  . Occupation: Conservation officer, historic buildings  Tobacco Use  . Smoking  status: Never Smoker  . Smokeless tobacco: Never Used  Vaping Use  . Vaping Use: Never used  Substance and Sexual Activity  . Alcohol use: Not Currently    Alcohol/week: 0.0 standard drinks  . Drug use: No  . Sexual activity: Never    Comment: never sexually active  Other Topics Concern  . Not on file  Social History Narrative   Unemployed   Lives with Dad   Seeking work   Only child   Dog 3   Lives in a one story house with father, has a cat-12/26/16-sjb   Social Determinants of Corporate investment banker Strain:   . Difficulty of Paying Living Expenses:   Food Insecurity:   . Worried About Programme researcher, broadcasting/film/video in the Last Year:   . Barista in the Last Year:   Transportation Needs:   . Freight forwarder (Medical):   Marland Kitchen Lack of Transportation (Non-Medical):   Physical Activity:   . Days of Exercise per Week:   . Minutes of Exercise per Session:   Stress:   . Feeling  of Stress :   Social Connections:   . Frequency of Communication with Friends and Family:   . Frequency of Social Gatherings with Friends and Family:   . Attends Religious Services:   . Active Member of Clubs or Organizations:   . Attends Banker Meetings:   Marland Kitchen Marital Status:   Intimate Partner Violence:   . Fear of Current or Ex-Partner:   . Emotionally Abused:   Marland Kitchen Physically Abused:   . Sexually Abused:     Past Surgical History:  Procedure Laterality Date  . KNEE ARTHROSCOPY Right 2007  . TONSILLECTOMY  2010  . WISDOM TOOTH EXTRACTION      Family History  Problem Relation Age of Onset  . Depression Mother   . Obesity Mother   . Depression Father   . Sleep apnea Father   . Obesity Father   . Cancer Paternal Aunt        colon  . Cancer Paternal Uncle        brain cancer  . Diabetes Maternal Grandfather   . Arthritis Maternal Grandfather   . Arthritis Maternal Grandmother   . Breast cancer Maternal Grandmother   . Arthritis Paternal Grandmother   . Arthritis  Paternal Grandfather     Allergies  Allergen Reactions  . Augmentin [Amoxicillin-Pot Clavulanate] Hives    Current Outpatient Medications on File Prior to Visit  Medication Sig Dispense Refill  . albuterol (VENTOLIN HFA) 108 (90 Base) MCG/ACT inhaler Inhale 2 puffs into the lungs every 6 (six) hours as needed for wheezing or shortness of breath (Chest tightness). 18 g 0  . aspirin-acetaminophen-caffeine (EXCEDRIN MIGRAINE) 250-250-65 MG tablet Take 2 tablets by mouth every 6 (six) hours as needed for headache.     . Iron-FA-B Cmp-C-Biot-Probiotic (FUSION PLUS) CAPS One capsule twice daily with orange juice. Stop your Ferrous sulfate. 30 capsule 1  . levothyroxine (SYNTHROID) 100 MCG tablet Take 1 tablet (100 mcg total) by mouth daily. 90 tablet 0  . levothyroxine (SYNTHROID) 112 MCG tablet Take 1 tablet (112 mcg total) by mouth daily. 90 tablet 0  . Multiple Vitamins-Minerals (MULTIVITAMIN WITH MINERALS) tablet Take 1 tablet by mouth daily.    Marland Kitchen omeprazole (PRILOSEC) 20 MG capsule Take 2 capsules (40 mg total) by mouth daily. 180 capsule 2  . propranolol (INDERAL) 20 MG tablet Take 1 tablet (20 mg total) by mouth daily. 90 tablet 3  . VITAMIN D PO Take 50 mcg by mouth daily.     . Vitamin D, Ergocalciferol, (DRISDOL) 1.25 MG (50000 UNIT) CAPS capsule Take 1 capsule (50,000 Units total) by mouth every 7 (seven) days. 4 capsule 0  . gabapentin (NEURONTIN) 600 MG tablet Take 1 tablet (600 mg total) by mouth at bedtime. 30 tablet 4  . lamoTRIgine (LAMICTAL) 200 MG tablet Take 1 tablet (200 mg total) by mouth daily. 30 tablet 3  . metoprolol tartrate (LOPRESSOR) 100 MG tablet Take 1 tablet (100 mg total) by mouth once for 1 dose. Please take two hours prior to your cardiac CT 1 tablet 0  . sertraline (ZOLOFT) 100 MG tablet Take 2 tablets (200 mg total) by mouth daily. 60 tablet 3   No current facility-administered medications on file prior to visit.    BP (!) 141/91 (BP Location: Right Arm,  Patient Position: Sitting, Cuff Size: Large)   Pulse 98   Temp 98.7 F (37.1 C) (Oral)   Resp 16   Wt (!) 370 lb (167.8 kg)   SpO2  100%   BMI 57.95 kg/m    Objective:   Physical Exam Constitutional:      Appearance: She is well-developed.  Cardiovascular:     Rate and Rhythm: Normal rate and regular rhythm.     Heart sounds: Normal heart sounds. No murmur heard.   Pulmonary:     Effort: Pulmonary effort is normal. No respiratory distress.     Breath sounds: Normal breath sounds. No wheezing.  Psychiatric:        Behavior: Behavior normal.        Thought Content: Thought content normal.        Judgment: Judgment normal.           Assessment & Plan:  Atypical chest pain- She has not heard back about scheduling her cardiac CT (as part of her pre-op cardiac work up).  I have advised her to call cardiology to inquire about scheduling.  Hypothyroid- admits to non-compliance with synthroid. Advised pt to resume synthroid and check follow up tsh in 1 month.  Depression- stable on current regimen.  (managed by psychiatry).  Morbid obesity- following with the medical weight loss clinic.  Elevated blood pressure reading- repeat BP OK. Continue to monitor.  Anemia- exacerbated by her heavy menstrual cycles.  Hopefully her cardiac clearance can be completed in the near future and she will be able to undergo her GYN procedure. She is also in the process of arranging follow up with her hematologist.   This visit occurred during the SARS-CoV-2 public health emergency.  Safety protocols were in place, including screening questions prior to the visit, additional usage of staff PPE, and extensive cleaning of exam room while observing appropriate contact time as indicated for disinfecting solutions.

## 2019-10-21 DIAGNOSIS — F4323 Adjustment disorder with mixed anxiety and depressed mood: Secondary | ICD-10-CM | POA: Diagnosis not present

## 2019-10-23 ENCOUNTER — Ambulatory Visit: Payer: BC Managed Care – PPO | Admitting: Cardiology

## 2019-10-26 ENCOUNTER — Telehealth (INDEPENDENT_AMBULATORY_CARE_PROVIDER_SITE_OTHER): Payer: Self-pay | Admitting: Psychology

## 2019-10-26 ENCOUNTER — Telehealth (INDEPENDENT_AMBULATORY_CARE_PROVIDER_SITE_OTHER): Payer: BC Managed Care – PPO | Admitting: Psychology

## 2019-10-26 ENCOUNTER — Other Ambulatory Visit: Payer: Self-pay

## 2019-10-26 NOTE — Telephone Encounter (Signed)
°  Office: [phone]  /  Fax: [phone]  Date of Call: October 26, 2019  Time of Call: 10:32am Duration of Call: ~ 3 minutes Provider: Lawerance Cruel, PsyD  CONTENT: This provider called Margaret Golden to check-in as she did not present for today's MyChart Video Visit appointment at 10:30am. Margaret Golden shared she woke up with GI symptoms (nausea and vomiting) and requested to reschedule today's appointment. This provider recommended she contact her PCP; Margaret Golden agreed. A brief risk assessment was completed. Margaret Golden denied experiencing suicidal and homicidal ideation, plan, or intent since the last appointment with this provider.   PLAN: Margaret Golden is scheduled for an appointment on November 11, 2019 at 2:00pm via MyChart Video Visit.

## 2019-10-27 ENCOUNTER — Encounter: Payer: Self-pay | Admitting: Hematology & Oncology

## 2019-10-28 ENCOUNTER — Other Ambulatory Visit: Payer: Self-pay | Admitting: Family

## 2019-10-28 ENCOUNTER — Telehealth: Payer: Self-pay | Admitting: Family

## 2019-10-28 NOTE — Progress Notes (Unsigned)
Office: [phone]  /  Fax: [phone]    Date: November 11, 2019   Appointment Start Time: *** Duration: *** minutes Provider: Lawerance Cruel, Psy.D. Type of Session: Individual Therapy  Location of Patient: {gbptloc:23249} Location of Provider: {Location of Service:22491} Type of Contact: Telepsychological Visit via MyChart Video Visit  Session Content: Margaret Golden is a 29 y.o. female presenting via MyChart Video Visit for a follow-up appointment to address the previously established treatment goal of increasing coping skills. Today's appointment was a telepsychological visit due to COVID-19. Margaret Golden provided verbal consent for today's telepsychological appointment and she is aware she is responsible for securing confidentiality on her end of the session. Prior to proceeding with today's appointment, Margaret Golden's physical location at the time of this appointment was obtained as well a phone number she could be reached at in the event of technical difficulties. Margaret Golden and this provider participated in today's telepsychological service.   This provider conducted a brief check-in and verbally administered the PHQ-9 and GAD-7. *** Margaret Golden was receptive to today's appointment as evidenced by openness to sharing, responsiveness to feedback, and {gbreceptiveness:23401}.  Mental Status Examination:  Appearance: {Appearance:22431} Behavior: {Behavior:22445} Mood: {gbmood:21757} Affect: {Affect:22436} Speech: {Speech:22432} Eye Contact: {Eye Contact:22433} Psychomotor Activity: {Motor Activity:22434} Gait: {gbgait:23404} Thought Process: {thought process:22448}  Thought Content/Perception: {disturbances:22451} Orientation: {Orientation:22437} Memory/Concentration: {gbcognition:22449} Insight/Judgment: {Insight:22446}  Structured Assessments Results: The Patient Health Questionnaire-9 (PHQ-9) is a self-report measure that assesses symptoms and severity of depression over the course of the last two  weeks. Margaret Golden obtained a score of *** suggesting {GBPHQ9SEVERITY:21752}. Margaret Golden finds the endorsed symptoms to be {gbphq9difficulty:21754}. [0= Not at all; 1= Several days; 2= More than half the days; 3= Nearly every day] Little interest or pleasure in doing things ***  Feeling down, depressed, or hopeless ***  Trouble falling or staying asleep, or sleeping too much ***  Feeling tired or having little energy ***  Poor appetite or overeating ***  Feeling bad about yourself --- or that you are a failure or have let yourself or your family down ***  Trouble concentrating on things, such as reading the newspaper or watching television ***  Moving or speaking so slowly that other people could have noticed? Or the opposite --- being so fidgety or restless that you have been moving around a lot more than usual ***  Thoughts that you would be better off dead or hurting yourself in some way ***  PHQ-9 Score ***    The Generalized Anxiety Disorder-7 (GAD-7) is a brief self-report measure that assesses symptoms of anxiety over the course of the last two weeks. Margaret Golden obtained a score of *** suggesting {gbgad7severity:21753}. Margaret Golden finds the endorsed symptoms to be {gbphq9difficulty:21754}. [0= Not at all; 1= Several days; 2= Over half the days; 3= Nearly every day] Feeling nervous, anxious, on edge ***  Not being able to stop or control worrying ***  Worrying too much about different things ***  Trouble relaxing ***  Being so restless that it's hard to sit still ***  Becoming easily annoyed or irritable ***  Feeling afraid as if something awful might happen ***  GAD-7 Score ***   Interventions:  {Interventions for Progress Notes:23405}  DSM-5 Diagnosis(es): 296.31 (F33.0) Major Depressive Disorder, Recurrent Episode, Mild, With Anxious Distress  Treatment Goal & Progress: During the initial appointment with this provider, the following treatment goal was established: increase coping skills. Margaret Golden  has demonstrated progress in her goal as evidenced by {gbtxprogress:22839}. [name] also {gbtxprogress2:22951}.  Plan: The next appointment will  be scheduled in {gbweeks:21758}, which will be {gbtxmodality:23402}. The next session will focus on {Plan for Next Appointment:23400}.

## 2019-10-30 ENCOUNTER — Other Ambulatory Visit: Payer: Self-pay

## 2019-10-30 ENCOUNTER — Inpatient Hospital Stay: Payer: BC Managed Care – PPO | Attending: Family

## 2019-10-30 VITALS — BP 134/81 | HR 77 | Temp 98.3°F

## 2019-10-30 DIAGNOSIS — D500 Iron deficiency anemia secondary to blood loss (chronic): Secondary | ICD-10-CM | POA: Insufficient documentation

## 2019-10-30 DIAGNOSIS — N920 Excessive and frequent menstruation with regular cycle: Secondary | ICD-10-CM | POA: Diagnosis not present

## 2019-10-30 MED ORDER — SODIUM CHLORIDE 0.9 % IV SOLN
Freq: Once | INTRAVENOUS | Status: AC
  Filled 2019-10-30: qty 250

## 2019-10-30 MED ORDER — SODIUM CHLORIDE 0.9 % IV SOLN
510.0000 mg | Freq: Once | INTRAVENOUS | Status: AC
  Administered 2019-10-30: 510 mg via INTRAVENOUS
  Filled 2019-10-30: qty 510

## 2019-10-30 NOTE — Patient Instructions (Signed)

## 2019-11-02 ENCOUNTER — Encounter (INDEPENDENT_AMBULATORY_CARE_PROVIDER_SITE_OTHER): Payer: Self-pay | Admitting: Physician Assistant

## 2019-11-02 ENCOUNTER — Other Ambulatory Visit: Payer: Self-pay

## 2019-11-02 ENCOUNTER — Ambulatory Visit (INDEPENDENT_AMBULATORY_CARE_PROVIDER_SITE_OTHER): Payer: BC Managed Care – PPO | Admitting: Physician Assistant

## 2019-11-02 VITALS — BP 133/92 | HR 88 | Temp 98.0°F | Ht 67.0 in | Wt 364.0 lb

## 2019-11-02 DIAGNOSIS — Z9189 Other specified personal risk factors, not elsewhere classified: Secondary | ICD-10-CM

## 2019-11-02 DIAGNOSIS — E559 Vitamin D deficiency, unspecified: Secondary | ICD-10-CM

## 2019-11-02 DIAGNOSIS — Z6841 Body Mass Index (BMI) 40.0 and over, adult: Secondary | ICD-10-CM

## 2019-11-02 DIAGNOSIS — E8881 Metabolic syndrome: Secondary | ICD-10-CM

## 2019-11-02 MED ORDER — VITAMIN D (ERGOCALCIFEROL) 1.25 MG (50000 UNIT) PO CAPS: 50000.0000 [IU] | ORAL_CAPSULE | ORAL | 0 refills | Status: DC

## 2019-11-02 NOTE — Progress Notes (Signed)
Chief Complaint:   OBESITY Margaret Golden is here to discuss her progress with her obesity treatment plan along with follow-up of her obesity related diagnoses. Margaret Golden is on the Category 3 Plan and states she is following her eating plan approximately 10% of the time. Margaret Golden states she is exercising 0 minutes 0 times per week.  Today's visit was #: 5 Starting weight: 367 lbs Starting date: 09/03/2019 Today's weight: 364 lbs Today's date: 11/02/2019 Total lbs lost to date: 3 Total lbs lost since last in-office visit: 1  Interim History: Margaret Golden reports that she wakes up nauseous in the morning and often skips breakfast. There are times when she eats only once a day and often it is fast food.  Subjective:   Vitamin D deficiency. Margaret Golden is on prescription Vitamin D weekly. No nausea, vomiting, or muscle weakness.    Ref. Range 09/03/2019 15:03  Vitamin D, 25-Hydroxy Latest Ref Range: 30.0 - 100.0 ng/mL 16.1 (L)   Insulin resistance. Margaret Golden has a diagnosis of insulin resistance based on her elevated fasting insulin level >5. She continues to work on diet and exercise to decrease her risk of diabetes. Margaret Golden is on no medications and denies polyphagia.  Lab Results  Component Value Date   INSULIN 29.2 (H) 09/03/2019   Lab Results  Component Value Date   HGBA1C 4.9 09/03/2019   At risk for diabetes mellitus. Margaret Golden is at higher than average risk for developing diabetes due to her obesity.   Assessment/Plan:   Vitamin D deficiency. Low Vitamin D level contributes to fatigue and are associated with obesity, breast, and colon cancer. She was given a refill for Vitamin D, Ergocalciferol, (DRISDOL) 1.25 MG (50000 UNIT) CAPS capsule every week #4 with 0 refills and will follow-up for routine testing of Vitamin D, at least 2-3 times per year to avoid over-replacement.   Insulin resistance. Margaret Golden will continue to work on weight loss, exercise, and decreasing simple carbohydrates to help  decrease the risk of diabetes. Margaret Golden agreed to follow-up with [place] as directed to closely monitor her progress.  At risk for diabetes mellitus. Margaret Golden was given approximately 15 minutes of diabetes education and counseling today. We discussed intensive lifestyle modifications today with an emphasis on weight loss as well as increasing exercise and decreasing simple carbohydrates in her diet. We also reviewed medication options with an emphasis on risk versus benefit of those discussed.   Repetitive spaced learning was employed today to elicit superior memory formation and behavioral change.  Class 3 severe obesity with serious comorbidity and body mass index (BMI) of 50.0 to 59.9 in adult, unspecified obesity type (HCC).  Margaret Golden is currently in the action stage of change. As such, her goal is to continue with weight loss efforts. She has agreed to the Category 3 Plan.   Exercise goals: For substantial health benefits, adults should do at least 150 minutes (2 hours and 30 minutes) a week of moderate-intensity, or 75 minutes (1 hour and 15 minutes) a week of vigorous-intensity aerobic physical activity, or an equivalent combination of moderate- and vigorous-intensity aerobic activity. Aerobic activity should be performed in episodes of at least 10 minutes, and preferably, it should be spread throughout the week.  Behavioral modification strategies: increasing lean protein intake and no skipping meals.  Margaret Golden has agreed to follow-up with our clinic in 3 weeks. She was informed of the importance of frequent follow-up visits to maximize her success with intensive lifestyle modifications for her multiple health conditions.  Objective:   Blood pressure (!) 133/92, pulse 88, temperature 98 F (36.7 C), temperature source Oral, height 5\' 7"  (1.702 m), weight (!) 364 lb (165.1 kg), SpO2 96 %. Body mass index is 57.01 kg/m.  General: Cooperative, alert, well developed, in no acute distress. HEENT:  Conjunctivae and lids unremarkable. Cardiovascular: Regular rhythm.  Lungs: Normal work of breathing. Neurologic: No focal deficits.   Lab Results  Component Value Date   CREATININE 0.74 09/30/2019   BUN 8 09/30/2019   NA 139 09/30/2019   K 3.9 09/30/2019   CL 107 09/30/2019   CO2 25 09/30/2019   Lab Results  Component Value Date   ALT 14 09/30/2019   AST 27 09/30/2019   ALKPHOS 69 09/30/2019   BILITOT 0.4 09/30/2019   Lab Results  Component Value Date   HGBA1C 4.9 09/03/2019   HGBA1C 5.0 07/30/2019   HGBA1C 4.6 07/06/2016   Lab Results  Component Value Date   INSULIN 29.2 (H) 09/03/2019   Lab Results  Component Value Date   TSH 18.300 (H) 09/03/2019   Lab Results  Component Value Date   CHOL 150 09/03/2019   HDL 39 (L) 09/03/2019   LDLCALC 89 09/03/2019   TRIG 120 09/03/2019   CHOLHDL 4 07/21/2019   Lab Results  Component Value Date   WBC 7.5 09/30/2019   HGB 9.1 (L) 09/30/2019   HCT 32.7 (L) 09/30/2019   MCV 73.0 (L) 09/30/2019   PLT 214 09/30/2019   Lab Results  Component Value Date   IRON 27 (L) 09/30/2019   TIBC 423 09/30/2019   FERRITIN 6 (L) 09/30/2019   Attestation Statements:   Reviewed by clinician on day of visit: allergies, medications, problem list, medical history, surgical history, family history, social history, and previous encounter notes.  I07/05/2019, am acting as transcriptionist for Marianna Payment, PA-C   I have reviewed the above documentation for accuracy and completeness, and I agree with the above. Alois Cliche, PA-C

## 2019-11-06 ENCOUNTER — Other Ambulatory Visit: Payer: Self-pay

## 2019-11-06 ENCOUNTER — Inpatient Hospital Stay: Payer: BC Managed Care – PPO | Attending: Family

## 2019-11-06 VITALS — BP 111/61 | HR 83 | Temp 98.0°F | Resp 17

## 2019-11-06 DIAGNOSIS — D500 Iron deficiency anemia secondary to blood loss (chronic): Secondary | ICD-10-CM | POA: Diagnosis not present

## 2019-11-06 DIAGNOSIS — N920 Excessive and frequent menstruation with regular cycle: Secondary | ICD-10-CM | POA: Diagnosis not present

## 2019-11-06 MED ORDER — SODIUM CHLORIDE 0.9 % IV SOLN
Freq: Once | INTRAVENOUS | Status: AC
  Filled 2019-11-06: qty 250

## 2019-11-06 MED ORDER — SODIUM CHLORIDE 0.9 % IV SOLN
510.0000 mg | Freq: Once | INTRAVENOUS | Status: AC
  Administered 2019-11-06: 510 mg via INTRAVENOUS
  Filled 2019-11-06: qty 510

## 2019-11-06 NOTE — Patient Instructions (Signed)

## 2019-11-09 DIAGNOSIS — F4323 Adjustment disorder with mixed anxiety and depressed mood: Secondary | ICD-10-CM | POA: Diagnosis not present

## 2019-11-10 NOTE — Progress Notes (Signed)
[facility]    Provider:  Dr Lucia Gaskins Requesting Provider: Quillian Quince, MD Primary Care Provider:  [name], NP  CC:  headaches  HPI:  Margaret Golden is a 29 y.o. female here as requested by Quillian Quince, MD for migraines.  Past medical history thyroid disease, restless leg syndrome, migraines, major depressive disorder, lower extremity edema, joint pain, hypothyroidism, history of kidney stones, fatty liver, exercise-induced asthma, depression, back pain, arthritis, anxiety, ADD, HTN, obesity.  I reviewed Dr. Francena Hanly notes, patient reports chronic migraines, can have up to 10-14 headaches per month, she has been using over-the-counter Excedrin Migraine 250 mg 2 tablets as needed, she also has depression with anxiety, fatigue and low energy it may be related to obesity depression or many other causes including migraine, behavior modification was discussed and she is currently in the action stage of change for weight loss.   I was able to review neurologist Dr. Moises Blood notes he saw patient in 2019, patient reported 7 out of 10 intensity, 3-4 headache days a month which 1 to 2 days are migraines, no NSAIDs, she tried Excedrin Migraine in the past, she also tried sertraline and Topamax, lamotrigine and Horizant in the past as well, rare alcohol no smoker, depression controlled, migraine started as a teenager and became more frequent at age 33, bandlike distribution, pressure stabbing, no aura no prodrome no post room, associated symptoms include photophobia, phonophobia, nausea, no vomiting or visual disturbance often, not waking her up from sleep, can last up to 3 days with a total of 7 headache days a month initially and improved to 3-4 headache days a month at follow-up appointment.  Neurologic exam was normal.  Topiramate was continued at that time.  Migraines have gotten worse, she has been managing with excedrin and aspirin, was taking once a week but now  worsening for >3 months and worsenined with anemia when she was hospitalized and migraines worsening. She is waking up with headaches, a few years ago she had a sleep test and it was negative, tired during the day, very fatigued, daily headaches in the morning and feels like a band around her head, migraines are pulsating/pounding/throbbing, photophobia/phonophobia, nausea, no vomiting, it hurts to move, she reports "slight double vision", laying down and sleeping helps, she is getting 14 migraine days a month, suffering all are moderate to severe, no medication overuse, no aura, can last 24-48 hours untreated if she catches them soon they can last a few hours. She had one that last 1.5 weeks in the past. She has associated vertigo/dizziness but no weakness or sensory changes. She drinks caffeine is trying to cut back but she has a caffeine "addiction" and we discussed rebound headache.   From a thorough review of records: Medication tried that can be used in migraine management include ibuprofen, Aleve, Tylenol, Fioricet, sumatriptan, Zofran, propranolol, Topamax, bupropion, Excedrin Migraine, lamotrigine, Horizant, metoprolol.  Reviewed notes, labs and imaging from outside physicians, which showed:  Ferritin 6, TSH 18.3, CMP unremarkable  Review of Systems: Patient complains of symptoms per HPI as well as the following symptoms: obesity, fatigue, anemia. Pertinent negatives and positives per HPI. All others negative.   Social History   Socioeconomic History  . Marital status: Single    Spouse name: Not on file  . Number of children: 0  . Years of education: 43  . Highest education level: Not on file  Occupational History  . Occupation: Conservation officer, historic buildings  Tobacco Use  . Smoking status: Never  Smoker  . Smokeless tobacco: Never Used  Vaping Use  . Vaping Use: Never used  Substance and Sexual Activity  . Alcohol use: Not Currently    Alcohol/week: 0.0 standard drinks  . Drug use: No    . Sexual activity: Never    Comment: never sexually active  Other Topics Concern  . Not on file  Social History Narrative   Unemployed   Lives alone --update 11/11/19   Seeking work   Only child   Dog 3   Lives in a one story house with father, has a cat-12/26/16-sjb      Caffeine: 2-4 cups/day   Social Determinants of Health   Financial Resource Strain:   . Difficulty of Paying Living Expenses:   Food Insecurity:   . Worried About Programme researcher, broadcasting/film/video in the Last Year:   . Barista in the Last Year:   Transportation Needs:   . Freight forwarder (Medical):   Marland Kitchen Lack of Transportation (Non-Medical):   Physical Activity:   . Days of Exercise per Week:   . Minutes of Exercise per Session:   Stress:   . Feeling of Stress :   Social Connections:   . Frequency of Communication with Friends and Family:   . Frequency of Social Gatherings with Friends and Family:   . Attends Religious Services:   . Active Member of Clubs or Organizations:   . Attends Banker Meetings:   Marland Kitchen Marital Status:   Intimate Partner Violence:   . Fear of Current or Ex-Partner:   . Emotionally Abused:   Marland Kitchen Physically Abused:   . Sexually Abused:     Family History  Problem Relation Age of Onset  . Depression Mother   . Obesity Mother   . Depression Father   . Sleep apnea Father   . Obesity Father   . Cancer Paternal Aunt        colon  . Cancer Paternal Uncle        brain cancer  . Diabetes Maternal Grandfather   . Arthritis Maternal Grandfather   . Arthritis Maternal Grandmother   . Breast cancer Maternal Grandmother   . Arthritis Paternal Grandmother   . Arthritis Paternal Grandfather   . Migraines Neg Hx     Past Medical History:  Diagnosis Date  . Acid reflux   . ADD (attention deficit disorder)   . Anemia   . Anxiety   . Arthritis    left ankle  . Back pain   . Chest pain   . Depression   . Dyspnea   . Exercise-induced asthma   . Fatty liver   .  History of blood transfusion   . History of chicken pox   . History of kidney stones   . Hypothyroidism   . Joint pain   . Lower extremity edema   . Major depressive disorder   . Migraines    chronic  . Pneumonia   . RLS (restless legs syndrome)   . Thyroid disease    hypothyroid    Patient Active Problem List   Diagnosis Date Noted  . IDA (iron deficiency anemia) 10/20/2019  . Hematometra 07/15/2019  . Menorrhagia with regular cycle 07/15/2019  . Symptomatic anemia 04/07/2019  . Social phobia 02/26/2018  . Major depressive disorder, recurrent episode, mild (HCC) 02/26/2018  . Insomnia 02/26/2018  . Left ankle pain 02/28/2017  . Morbid obesity (HCC) 05/11/2016  . Fatty liver disease, nonalcoholic 02/02/2015  .  Depression 01/03/2015  . Hypothyroidism 01/03/2015  . GERD (gastroesophageal reflux disease) 01/03/2015  . Migraines 01/03/2015  . Essential hypertension 06/02/2014    Past Surgical History:  Procedure Laterality Date  . KNEE ARTHROSCOPY Right 2007  . TONSILLECTOMY  2010  . WISDOM TOOTH EXTRACTION      Current Outpatient Medications  Medication Sig Dispense Refill  . albuterol (VENTOLIN HFA) 108 (90 Base) MCG/ACT inhaler Inhale 2 puffs into the lungs every 6 (six) hours as needed for wheezing or shortness of breath (Chest tightness). 18 g 0  . aspirin-acetaminophen-caffeine (EXCEDRIN MIGRAINE) 250-250-65 MG tablet Take 2 tablets by mouth every 6 (six) hours as needed for headache.     . Iron-FA-B Cmp-C-Biot-Probiotic (FUSION PLUS) CAPS One capsule twice daily with orange juice. Stop your Ferrous sulfate. 30 capsule 1  . levothyroxine (SYNTHROID) 112 MCG tablet Take 1 tablet (112 mcg total) by mouth daily. 90 tablet 0  . Multiple Vitamins-Minerals (MULTIVITAMIN WITH MINERALS) tablet Take 1 tablet by mouth daily.    Marland Kitchen omeprazole (PRILOSEC) 20 MG capsule Take 2 capsules (40 mg total) by mouth daily. 180 capsule 2  . VITAMIN D PO Take 50 mcg by mouth daily.     .  Vitamin D, Ergocalciferol, (DRISDOL) 1.25 MG (50000 UNIT) CAPS capsule Take 1 capsule (50,000 Units total) by mouth every 7 (seven) days. 4 capsule 0  . rizatriptan (MAXALT-MLT) 10 MG disintegrating tablet Take 1 tablet (10 mg total) by mouth as needed for migraine. May repeat in 2 hours if needed 9 tablet 11  . topiramate (TOPAMAX) 50 MG tablet Start with 50mg (1 tab) at bedtime. In 1 week increase to 100mg (2 tabs) at bedtime. 180 tablet 6   No current facility-administered medications for this visit.    Allergies as of 11/11/2019 - Review Complete 11/11/2019  Allergen Reaction Noted  . Augmentin [amoxicillin-pot clavulanate] Hives 11/28/2015    Vitals: BP 126/80 (BP Location: Right Arm, Patient Position: Sitting, Cuff Size: Large)   Pulse 85   Ht 5\' 7"  (1.702 m)   Wt (!) 363 lb (164.7 kg)   BMI 56.85 kg/m  Last Weight:  Wt Readings from Last 1 Encounters:  11/11/19 (!) 363 lb (164.7 kg)   Last Height:   Ht Readings from Last 1 Encounters:  11/11/19 5\' 7"  (1.702 m)     Physical exam: Exam: Gen: NAD, conversant, well nourised,morbidly obese, well groomed                     CV: RRR, no MRG. No Carotid Bruits. No peripheral edema, warm, nontender Eyes: Conjunctivae clear without exudates or hemorrhage  Neuro: Detailed Neurologic Exam  Speech:    Speech is normal; fluent and spontaneous with normal comprehension.  Cognition:    The patient is oriented to person, place, and time;     recent and remote memory intact;     language fluent;     normal attention, concentration,     fund of knowledge Cranial Nerves:    The pupils are equal, round, and reactive to light. Blurring of the optic disks.. Visual fields are full to finger confrontation. Extraocular movements are intact. Trigeminal sensation is intact and the muscles of mastication are normal. The face is symmetric. The palate elevates in the midline. Hearing intact. Voice is normal. Shoulder shrug is normal. The tongue  has normal motion without fasciculations.   Coordination:    Normal finger to nose  Gait:    Wide based due to large  body habitus  Motor Observation:    No asymmetry, no atrophy, and no involuntary movements noted. Tone:    Normal muscle tone.    Posture:    Posture is normal. normal erect    Strength:    Strength is V/V in the upper and lower limbs.      Sensation: intact to LT     Reflex Exam:  DTR's:    Deep tendon reflexes in the upper and lower extremities are normal bilaterally.   Toes:    The toes are downgoing bilaterally.   Clonus:    Clonus is absent.    Assessment/Plan:  29 year old patient with chronic migraines however given concerning symptoms needs a thorough evaluation.  Morning headaches, fatigue, daytime somnolence, not feeling rested after sleep, sleep evaluation and sleep study for OSA or hypoventilation obesity syndrome.   For migraine prevention: Start Topiramate For acute management: Rizatriptan: Please take one tablet at the onset of your headache. If it does not improve the symptoms please take one additional tablet. Do not take more then 2 tablets in 24hrs. Do not take use more then 2 to 3 times in a week.  MRI of the brain: MRI brain due to concerning symptoms of morning headaches, positional headaches,diplopia, possible papilledema  to look for space occupying mass, chiari or intracranial hypertension (pseudotumor). MRI of the orbits due to diplopia and papilledema.   RLS: exacerbated by anemia and low ferritin. Topamax may help. She wason gabapentin we can prescribe if needed. Continue to follow for anemia.    Also many new medications on the market such as Ajovy, Nurtec, Bernita RaisinUbrelvy also consider botox for migraines. Discussed teratogenicity do not get pregnant.  Discussed: To prevent or relieve headaches, try the following: Cool Compress. Lie down and place a cool compress on your head.  Avoid headache triggers. If certain foods or odors seem  to have triggered your migraines in the past, avoid them. A headache diary might help you identify triggers.  Include physical activity in your daily routine. Try a daily walk or other moderate aerobic exercise.  Manage stress. Find healthy ways to cope with the stressors, such as delegating tasks on your to-do list.  Practice relaxation techniques. Try deep breathing, yoga, massage and visualization.  Eat regularly. Eating regularly scheduled meals and maintaining a healthy diet might help prevent headaches. Also, drink plenty of fluids.  Follow a regular sleep schedule. Sleep deprivation might contribute to headaches Consider biofeedback. With this mind-body technique, you learn to control certain bodily functions -- such as muscle tension, heart rate and blood pressure -- to prevent headaches or reduce headache pain.    Proceed to emergency room if you experience new or worsening symptoms or symptoms do not resolve, if you have new neurologic symptoms or if headache is severe, or for any concerning symptom.   Provided education and documentation from American headache Society toolbox including articles on: chronic migraine medication overuse headache, chronic migraines, prevention of migraines, behavioral and other nonpharmacologic treatments for headache.   Orders Placed This Encounter  Procedures  . MR BRAIN W WO CONTRAST  . MR ORBITS W WO CONTRAST  . Ambulatory referral to Sleep Studies   Meds ordered this encounter  Medications  . topiramate (TOPAMAX) 50 MG tablet    Sig: Start with 50mg (1 tab) at bedtime. In 1 week increase to 100mg (2 tabs) at bedtime.    Dispense:  180 tablet    Refill:  6  . rizatriptan (MAXALT-MLT) 10  MG disintegrating tablet    Sig: Take 1 tablet (10 mg total) by mouth as needed for migraine. May repeat in 2 hours if needed    Dispense:  9 tablet    Refill:  11    Cc: Wilder Glade, MD,  [name], NP, Quillian Quince, MD  Naomie Dean,  MD  [facility] [address] [place], [place] [address]  Phone [phone] Fax [phone]

## 2019-11-11 ENCOUNTER — Ambulatory Visit: Payer: BC Managed Care – PPO | Admitting: Neurology

## 2019-11-11 ENCOUNTER — Telehealth (INDEPENDENT_AMBULATORY_CARE_PROVIDER_SITE_OTHER): Payer: BC Managed Care – PPO | Admitting: Psychology

## 2019-11-11 ENCOUNTER — Encounter: Payer: Self-pay | Admitting: Neurology

## 2019-11-11 VITALS — BP 126/80 | HR 85 | Ht 67.0 in | Wt 363.0 lb

## 2019-11-11 DIAGNOSIS — G441 Vascular headache, not elsewhere classified: Secondary | ICD-10-CM | POA: Diagnosis not present

## 2019-11-11 DIAGNOSIS — H4710 Unspecified papilledema: Secondary | ICD-10-CM

## 2019-11-11 DIAGNOSIS — G43709 Chronic migraine without aura, not intractable, without status migrainosus: Secondary | ICD-10-CM | POA: Diagnosis not present

## 2019-11-11 DIAGNOSIS — H539 Unspecified visual disturbance: Secondary | ICD-10-CM

## 2019-11-11 DIAGNOSIS — R519 Headache, unspecified: Secondary | ICD-10-CM

## 2019-11-11 DIAGNOSIS — R51 Headache with orthostatic component, not elsewhere classified: Secondary | ICD-10-CM

## 2019-11-11 DIAGNOSIS — H532 Diplopia: Secondary | ICD-10-CM

## 2019-11-11 MED ORDER — TOPIRAMATE 50 MG PO TABS
ORAL_TABLET | ORAL | 6 refills | Status: DC
Start: 2019-11-11 — End: 2023-05-24

## 2019-11-11 MED ORDER — RIZATRIPTAN BENZOATE 10 MG PO TBDP
10.0000 mg | ORAL_TABLET | ORAL | 11 refills | Status: DC | PRN
Start: 2019-11-11 — End: 2020-02-16

## 2019-11-11 NOTE — Patient Instructions (Signed)
For migraine prevention: Start Topiramate For acute management: Rizatriptan: Please take one tablet at the onset of your headache. If it does not improve the symptoms please take one additional tablet. Do not take more then 2 tablets in 24hrs. Do not take use more then 2 to 3 times in a week.  MRI of the brain Sleep evaluation  Also many new medications on the market such as Ajovy, Nurtec, Bernita Raisin also consider botox for migraines.   Migraine Headache A migraine headache is a very strong throbbing pain on one side or both sides of your head. This type of headache can also cause other symptoms. It can last from 4 hours to 3 days. Talk with your doctor about what things may bring on (trigger) this condition. What are the causes? The exact cause of this condition is not known. This condition may be triggered or caused by:  Drinking alcohol.  Smoking.  Taking medicines, such as: ? Medicine used to treat chest pain (nitroglycerin). ? Birth control pills. ? Estrogen. ? Some blood pressure medicines.  Eating or drinking certain products.  Doing physical activity. Other things that may trigger a migraine headache include:  Having a menstrual period.  Pregnancy.  Hunger.  Stress.  Not getting enough sleep or getting too much sleep.  Weather changes.  Tiredness (fatigue). What increases the risk?  Being 6-71 years old.  Being female.  Having a family history of migraine headaches.  Being Caucasian.  Having depression or anxiety.  Being very overweight. What are the signs or symptoms?  A throbbing pain. This pain may: ? Happen in any area of the head, such as on one side or both sides. ? Make it hard to do daily activities. ? Get worse with physical activity. ? Get worse around bright lights or loud noises.  Other symptoms may include: ? Feeling sick to your stomach (nauseous). ? Vomiting. ? Dizziness. ? Being sensitive to bright lights, loud noises, or  smells.  Before you get a migraine headache, you may get warning signs (an aura). An aura may include: ? Seeing flashing lights or having blind spots. ? Seeing bright spots, halos, or zigzag lines. ? Having tunnel vision or blurred vision. ? Having numbness or a tingling feeling. ? Having trouble talking. ? Having weak muscles.  Some people have symptoms after a migraine headache (postdromal phase), such as: ? Tiredness. ? Trouble thinking (concentrating). How is this treated?  Taking medicines that: ? Relieve pain. ? Relieve the feeling of being sick to your stomach. ? Prevent migraine headaches.  Treatment may also include: ? Having acupuncture. ? Avoiding foods that bring on migraine headaches. ? Learning ways to control your body functions (biofeedback). ? Therapy to help you know and deal with negative thoughts (cognitive behavioral therapy). Follow these instructions at home: Medicines  Take over-the-counter and prescription medicines only as told by your doctor.  Ask your doctor if the medicine prescribed to you: ? Requires you to avoid driving or using heavy machinery. ? Can cause trouble pooping (constipation). You may need to take these steps to prevent or treat trouble pooping:  Drink enough fluid to keep your pee (urine) pale yellow.  Take over-the-counter or prescription medicines.  Eat foods that are high in fiber. These include beans, whole grains, and fresh fruits and vegetables.  Limit foods that are high in fat and sugar. These include fried or sweet foods. Lifestyle  Do not drink alcohol.  Do not use any products that contain nicotine  or tobacco, such as cigarettes, e-cigarettes, and chewing tobacco. If you need help quitting, ask your doctor.  Get at least 8 hours of sleep every night.  Limit and deal with stress. General instructions      Keep a journal to find out what may bring on your migraine headaches. For example, write down: ? What  you eat and drink. ? How much sleep you get. ? Any change in what you eat or drink. ? Any change in your medicines.  If you have a migraine headache: ? Avoid things that make your symptoms worse, such as bright lights. ? It may help to lie down in a dark, quiet room. ? Do not drive or use heavy machinery. ? Ask your doctor what activities are safe for you.  Keep all follow-up visits as told by your doctor. This is important. Contact a doctor if:  You get a migraine headache that is different or worse than others you have had.  You have more than 15 headache days in one month. Get help right away if:  Your migraine headache gets very bad.  Your migraine headache lasts longer than 72 hours.  You have a fever.  You have a stiff neck.  You have trouble seeing.  Your muscles feel weak or like you cannot control them.  You start to lose your balance a lot.  You start to have trouble walking.  You pass out (faint).  You have a seizure. Summary  A migraine headache is a very strong throbbing pain on one side or both sides of your head. These headaches can also cause other symptoms.  This condition may be treated with medicines and changes to your lifestyle.  Keep a journal to find out what may bring on your migraine headaches.  Contact a doctor if you get a migraine headache that is different or worse than others you have had.  Contact your doctor if you have more than 15 headache days in a month. This information is not intended to replace advice given to you by your health care provider. Make sure you discuss any questions you have with your health care provider. Document Revised: 07/11/2018 Document Reviewed: 05/01/2018 Elsevier Patient Education  2020 [employer]  Idiopathic Intracranial Hypertension  Idiopathic intracranial hypertension (IIH) is a condition that increases pressure around the brain. The fluid that surrounds the brain and spinal cord  (cerebrospinal fluid, CSF) increases and causes the pressure. Idiopathic means that the cause of this condition is not known. IIH affects the brain and spinal cord (is a neurological disorder). If this condition is not treated, it can cause vision loss or blindness. What increases the risk? You are more likely to develop this condition if:  You are severely overweight (obese).  You are a woman who has not gone through menopause.  You take certain medicines, such as birth control or steroids. What are the signs or symptoms? Symptoms of IIH include:  Headaches. This is the most common symptom.  Pain in the shoulders or neck.  Nausea and vomiting.  A "rushing water" or pulsing sound within the ears (pulsatile tinnitus).  Double vision.  Blurred vision.  Brief episodes of complete vision loss. How is this diagnosed? This condition may be diagnosed based on:  Your symptoms.  Your medical history.  CT scan of the brain.  MRI of the brain.  Magnetic resonance venogram (MRV) to check veins in the brain.  Diagnostic lumbar puncture. This is a procedure to remove and  examine a sample of cerebrospinal fluid. This procedure can determine whether too much fluid may be causing IIH.  A thorough eye exam to check for swelling or nerve damage in the eyes. How is this treated? Treatment for this condition depends on your symptoms. The goal of treatment is to decrease the pressure around your brain. Common treatments include:  Medicines to decrease the production of spinal fluid and lower the pressure within your skull.  Medicines to prevent or treat headaches.  Surgery to place drains (shunts) in your brain to remove excess fluid.  Lumbar puncture to remove excess cerebrospinal fluid. Follow these instructions at home:  If you are overweight or obese, work with your health care provider to lose weight.  Take over-the-counter and prescription medicines only as told by your health  care provider.  Do not drive or use heavy machinery while taking medicines that can make you sleepy.  Keep all follow-up visits as told by your health care provider. This is important. Contact a health care provider if:  You have changes in your vision, such as: ? Double vision. ? Not being able to see colors (color vision). Get help right away if:  You have any of the following symptoms and they get worse or do not get better. ? Headaches. ? Nausea. ? Vomiting. ? Vision changes or difficulty seeing. Summary  Idiopathic intracranial hypertension (IIH) is a condition that increases pressure around the brain. The cause is not known (is idiopathic).  The most common symptom of IIH is headaches.  Treatment may include medicines or surgery to relieve the pressure on your brain. This information is not intended to replace advice given to you by your health care provider. Make sure you discuss any questions you have with your health care provider. Document Revised: 03/01/2017 Document Reviewed: 02/08/2016 Elsevier Patient Education  2020 [employer]  Obesity Hypoventilation Syndrome  Obesity hypoventilation syndrome (OHS) means that you are not breathing well enough to get air in and out of your lungs efficiently (ventilation). This causes a low oxygen level and a high carbon dioxide level in your blood (hypoventilation). Having too much total body fat (obesity) is a significant risk factor for developing OHS. OHS makes it harder for your heart to pump oxygen-rich blood to your body. It can cause sleep disturbances and make you feel sleepy during the day. Over time, OHS can increase your risk for:  Heart disease.  High blood pressure (hypertension).  Reduced ability to absorb sugar from the bloodstream (insulin resistance).  Heart failure. Over time, OHS weakens your heart and can lead to heart failure. What are the causes? The exact cause of OHS is not known. Possible causes  include:  Pressure on the lungs from excess body weight.  Obesity-related changes in how much air the lungs can hold (lung capacity) and how much they can expand (lung compliance).  Failure of the brain to regulate oxygen and carbon dioxide levels properly.  Chemicals (hormones) produced by excess fat cells interfering with breathing regulation.  A breathing condition in which breathing pauses or becomes shallow during sleep (sleep apnea). This condition can eventually cause the body to ventilate poorly and to hold onto carbon dioxide during the day. What increases the risk? You may have a greater risk for OHS if you:  Have a BMI of 30 or higher. BMI is an estimate of body fat that is calculated from height and weight. For adults, a BMI of 30 or higher is considered obese.  Are 24?30 years old.  Carry most of your excess weight around your waist.  Experience moderate symptoms of sleep apnea. What are the signs or symptoms? The most common symptoms of OHS are:  Daytime sleepiness.  Lack of energy.  Shortness of breath.  Morning headaches.  Sleep apnea.  Trouble concentrating.  Irritability, mood swings, or depression.  Swollen veins in the neck.  Swelling of the legs. How is this diagnosed? Your health care provider may suspect OHS if you are obese and have poor breathing during the day and at night. Your health care provider will also do a physical exam. You may have tests to:  Measure your BMI.  Measure your blood oxygen level with a sensor placed on your finger (pulse oximetry).  Measure blood oxygen and carbon dioxide in a blood sample.  Measure the amount of red blood cells in a blood sample. OHS causes the number of red blood cells you have to increase (polycythemia).  Check your breathing ability (pulmonary function testing).  Check your breathing ability, breathing patterns, and oxygen level while you sleep (sleep study). You may also have a chest X-ray  to rule out other breathing problems. You may have an electrocardiogram (ECG) and or echocardiogram to check for signs of heart failure. How is this treated? Weight loss is the most important part of treatment for OHS, and it may be the only treatment that you need. Other treatments may include:  Using a device to open your airway while you sleep, such as a continuous positive airway pressure (CPAP) machine that delivers oxygen to your airway through a mask.  Surgery (gastric bypass surgery) to lower your BMI. This may be needed if: ? You are very obese. ? Other treatments have not worked for you. ? Your OHS is very severe and is causing organ damage, such as heart failure. Follow these instructions at home:  Medicines  Take over-the-counter and prescription medicines only as told by your health care provider.  Ask your health care provider what medicines are safe for you. You may be told to avoid medicines that can impair breathing and make OHS worse, such as sedatives and narcotics. Sleeping habits  If you are prescribed a CPAP machine, make sure you understand and use the machine as directed.  Try to get 8 hours of sleep every night.  Go to bed at the same time every night, and get up at the same time every day. General instructions  Work with your health care provider to make a diet and exercise plan that helps you reach and maintain a healthy weight.  Eat a healthy diet.  Avoid smoking.  Exercise regularly as told by your health care provider.  During the evening, do not drink caffeine and do not eat heavy meals.  Keep all follow-up visits as told by your health care provider. This is important. Contact a health care provider if:  You experience new or worsening shortness of breath.  You have chest pain.  You have an irregular heartbeat (palpitations).  You have dizziness.  You faint.  You develop a cough.  You have a fever.  You have chest pain when you  breathe (pleurisy). This information is not intended to replace advice given to you by your health care provider. Make sure you discuss any questions you have with your health care provider. Document Revised: 07/11/2018 Document Reviewed: 08/29/2015 Elsevier Patient Education  2020 [employer]  Sleep Apnea Sleep apnea is a condition in which breathing  pauses or becomes shallow during sleep. Episodes of sleep apnea usually last 10 seconds or longer, and they may occur as many as 20 times an hour. Sleep apnea disrupts your sleep and keeps your body from getting the rest that it needs. This condition can increase your risk of certain health problems, including:  Heart attack.  Stroke.  Obesity.  Diabetes.  Heart failure.  Irregular heartbeat. What are the causes? There are three kinds of sleep apnea:  Obstructive sleep apnea. This kind is caused by a blocked or collapsed airway.  Central sleep apnea. This kind happens when the part of the brain that controls breathing does not send the correct signals to the muscles that control breathing.  Mixed sleep apnea. This is a combination of obstructive and central sleep apnea. The most common cause of this condition is a collapsed or blocked airway. An airway can collapse or become blocked if:  Your throat muscles are abnormally relaxed.  Your tongue and tonsils are larger than normal.  You are overweight.  Your airway is smaller than normal. What increases the risk? You are more likely to develop this condition if you:  Are overweight.  Smoke.  Have a smaller than normal airway.  Are elderly.  Are female.  Drink alcohol.  Take sedatives or tranquilizers.  Have a family history of sleep apnea. What are the signs or symptoms? Symptoms of this condition include:  Trouble staying asleep.  Daytime sleepiness and tiredness.  Irritability.  Loud snoring.  Morning headaches.  Trouble  concentrating.  Forgetfulness.  Decreased interest in sex.  Unexplained sleepiness.  Mood swings.  Personality changes.  Feelings of depression.  Waking up often during the night to urinate.  Dry mouth.  Sore throat. How is this diagnosed? This condition may be diagnosed with:  A medical history.  A physical exam.  A series of tests that are done while you are sleeping (sleep study). These tests are usually done in a sleep lab, but they may also be done at home. How is this treated? Treatment for this condition aims to restore normal breathing and to ease symptoms during sleep. It may involve managing health issues that can affect breathing, such as high blood pressure or obesity. Treatment may include:  Sleeping on your side.  Using a decongestant if you have nasal congestion.  Avoiding the use of depressants, including alcohol, sedatives, and narcotics.  Losing weight if you are overweight.  Making changes to your diet.  Quitting smoking.  Using a device to open your airway while you sleep, such as: ? An oral appliance. This is a custom-made mouthpiece that shifts your lower jaw forward. ? A continuous positive airway pressure (CPAP) device. This device blows air through a mask when you breathe out (exhale). ? A nasal expiratory positive airway pressure (EPAP) device. This device has valves that you put into each nostril. ? A bi-level positive airway pressure (BPAP) device. This device blows air through a mask when you breathe in (inhale) and breathe out (exhale).  Having surgery if other treatments do not work. During surgery, excess tissue is removed to create a wider airway. It is important to get treatment for sleep apnea. Without treatment, this condition can lead to:  High blood pressure.  Coronary artery disease.  In men, an inability to achieve or maintain an erection (impotence).  Reduced thinking abilities. Follow these instructions at  home: Lifestyle  Make any lifestyle changes that your health care provider recommends.  Eat a  healthy, well-balanced diet.  Take steps to lose weight if you are overweight.  Avoid using depressants, including alcohol, sedatives, and narcotics.  Do not use any products that contain nicotine or tobacco, such as cigarettes, e-cigarettes, and chewing tobacco. If you need help quitting, ask your health care provider. General instructions  Take over-the-counter and prescription medicines only as told by your health care provider.  If you were given a device to open your airway while you sleep, use it only as told by your health care provider.  If you are having surgery, make sure to tell your health care provider you have sleep apnea. You may need to bring your device with you.  Keep all follow-up visits as told by your health care provider. This is important. Contact a health care provider if:  The device that you received to open your airway during sleep is uncomfortable or does not seem to be working.  Your symptoms do not improve.  Your symptoms get worse. Get help right away if:  You develop: ? Chest pain. ? Shortness of breath. ? Discomfort in your back, arms, or stomach.  You have: ? Trouble speaking. ? Weakness on one side of your body. ? Drooping in your face. These symptoms may represent a serious problem that is an emergency. Do not wait to see if the symptoms will go away. Get medical help right away. Call your local emergency services (911 in the [place] Do not drive yourself to the hospital. Summary  Sleep apnea is a condition in which breathing pauses or becomes shallow during sleep.  The most common cause is a collapsed or blocked airway.  The goal of treatment is to restore normal breathing and to ease symptoms during sleep. This information is not intended to replace advice given to you by your health care provider. Make sure you discuss any questions you  have with your health care provider. Document Revised: 09/03/2018 Document Reviewed: 11/12/2017 Elsevier Patient Education  2020 [employer] Inc[employer] Rizatriptan disintegrating tablets What is this medicine? RIZATRIPTAN (rye za TRIP tan) is used to treat migraines with or without aura. An aura is a strange feeling or visual disturbance that warns you of an attack. It is not used to prevent migraines. This medicine may be used for other purposes; ask your health care provider or pharmacist if you have questions. COMMON BRAND NAME(S): Maxalt-MLT What should I tell my health care provider before I take this medicine? They need to know if you have any of these conditions:  cigarette smoker  circulation problems in fingers and toes  diabetes  heart disease  high blood pressure  high cholesterol  history of irregular heartbeat  history of stroke  kidney disease  liver disease  stomach or intestine problems  an unusual or allergic reaction to rizatriptan, other medicines, foods, dyes, or preservatives  pregnant or trying to get pregnant  breast-feeding How should I use this medicine? Take this medicine by mouth. Follow the directions on the prescription label. Leave the tablet in the sealed blister pack until you are ready to take it. With dry hands, open the blister and gently remove the tablet. If the tablet breaks or crumbles, throw it away and take a new tablet out of the blister pack. Place the tablet in the mouth and allow it to dissolve, and then swallow. Do not cut, crush, or chew this medicine. You do not need water to take this medicine. Do not take it more often than directed. Talk to  your pediatrician regarding the use of this medicine in children. While this drug may be prescribed for children as young as 6 years for selected conditions, precautions do apply. Overdosage: If you think you have taken too much of this medicine contact a poison control center or emergency room at  once. NOTE: This medicine is only for you. Do not share this medicine with others. What if I miss a dose? This does not apply. This medicine is not for regular use. What may interact with this medicine? Do not take this medicine with any of the following medicines:  certain medicines for migraine headache like almotriptan, eletriptan, frovatriptan, naratriptan, rizatriptan, sumatriptan, zolmitriptan  ergot alkaloids like dihydroergotamine, ergonovine, ergotamine, methylergonovine  MAOIs like Carbex, Eldepryl, Marplan, Nardil, and Parnate This medicine may also interact with the following medications:  certain medicines for depression, anxiety, or psychotic disorders  propranolol This list may not describe all possible interactions. Give your health care provider a list of all the medicines, herbs, non-prescription drugs, or dietary supplements you use. Also tell them if you smoke, drink alcohol, or use illegal drugs. Some items may interact with your medicine. What should I watch for while using this medicine? Visit your healthcare professional for regular checks on your progress. Tell your healthcare professional if your symptoms do not start to get better or if they get worse. You may get drowsy or dizzy. Do not drive, use machinery, or do anything that needs mental alertness until you know how this medicine affects you. Do not stand up or sit up quickly, especially if you are an older patient. This reduces the risk of dizzy or fainting spells. Alcohol may interfere with the effect of this medicine. Your mouth may get dry. Chewing sugarless gum or sucking hard candy and drinking plenty of water may help. Contact your healthcare professional if the problem does not go away or is severe. If you take migraine medicines for 10 or more days a month, your migraines may get worse. Keep a diary of headache days and medicine use. Contact your healthcare professional if your migraine attacks occur more  frequently. What side effects may I notice from receiving this medicine? Side effects that you should report to your doctor or health care professional as soon as possible:  allergic reactions like skin rash, itching or hives, swelling of the face, lips, or tongue  chest pain or chest tightness  signs and symptoms of a dangerous change in heartbeat or heart rhythm like chest pain; dizziness; fast, irregular heartbeat; palpitations; feeling faint or lightheaded; falls; breathing problems  signs and symptoms of a stroke like changes in vision; confusion; trouble speaking or understanding; severe headaches; sudden numbness or weakness of the face, arm or leg; trouble walking; dizziness; loss of balance or coordination  signs and symptoms of serotonin syndrome like irritable; confusion; diarrhea; fast or irregular heartbeat; muscle twitching; stiff muscles; trouble walking; sweating; high fever; seizures; chills; vomiting Side effects that usually do not require medical attention (report to your doctor or health care professional if they continue or are bothersome):  diarrhea  dizziness  drowsiness  dry mouth  headache  nausea, vomiting  pain, tingling, numbness in the hands or feet  stomach pain This list may not describe all possible side effects. Call your doctor for medical advice about side effects. You may report side effects to FDA at 1-800-FDA-1088. Where should I keep my medicine? Keep out of the reach of children. Store at room temperature between 15 and  30 degrees C (59 and 86 degrees F). Protect from light and moisture. Throw away any unused medicine after the expiration date. NOTE: This sheet is a summary. It may not cover all possible information. If you have questions about this medicine, talk to your doctor, pharmacist, or health care provider.  2020 Elsevier/Gold Standard (2017-10-01 14:58:08) Topiramate tablets What is this medicine? TOPIRAMATE (toe PYRE a mate)  is used to treat seizures in adults or children with epilepsy. It is also used for the prevention of migraine headaches. This medicine may be used for other purposes; ask your health care provider or pharmacist if you have questions. COMMON BRAND NAME(S): Topamax, Topiragen What should I tell my health care provider before I take this medicine? They need to know if you have any of these conditions:  bleeding disorders  kidney disease  lung or breathing disease, like asthma  suicidal thoughts, plans, or attempt; a previous suicide attempt by you or a family member  an unusual or allergic reaction to topiramate, other medicines, foods, dyes, or preservatives  pregnant or trying to get pregnant  breast-feeding How should I use this medicine? Take this medicine by mouth with a glass of water. Follow the directions on the prescription label. Do not cut, crush or chew this medicine. Swallow the tablets whole. You can take it with or without food. If it upsets your stomach, take it with food. Take your medicine at regular intervals. Do not take it more often than directed. Do not stop taking except on your doctor's advice. A special MedGuide will be given to you by the pharmacist with each prescription and refill. Be sure to read this information carefully each time. Talk to your pediatrician regarding the use of this medicine in children. While this drug may be prescribed for children as young as 492 years of age for selected conditions, precautions do apply. Overdosage: If you think you have taken too much of this medicine contact a poison control center or emergency room at once. NOTE: This medicine is only for you. Do not share this medicine with others. What if I miss a dose? If you miss a dose, take it as soon as you can. If your next dose is to be taken in less than 6 hours, then do not take the missed dose. Take the next dose at your regular time. Do not take double or extra doses. What may  interact with this medicine? This medicine may interact with the following medications:  acetazolamide  alcohol  antihistamines for allergy, cough, and cold  aspirin and aspirin-like medicines  atropine  birth control pills  certain medicines for anxiety or sleep  certain medicines for bladder problems like oxybutynin, tolterodine  certain medicines for depression like amitriptyline, fluoxetine, sertraline  certain medicines for seizures like carbamazepine, phenobarbital, phenytoin, primidone, valproic acid, zonisamide  certain medicines for stomach problems like dicyclomine, hyoscyamine  certain medicines for travel sickness like scopolamine  certain medicines for Parkinson's disease like benztropine, trihexyphenidyl  certain medicines that treat or prevent blood clots like warfarin, enoxaparin, dalteparin, apixaban, dabigatran, and rivaroxaban  digoxin  general anesthetics like halothane, isoflurane, methoxyflurane, propofol  hydrochlorothiazide  ipratropium  lithium  medicines that relax muscles for surgery  metformin  narcotic medicines for pain  NSAIDs, medicines for pain and inflammation, like ibuprofen or naproxen  phenothiazines like chlorpromazine, mesoridazine, prochlorperazine, thioridazine  pioglitazone This list may not describe all possible interactions. Give your health care provider a list of all the medicines, herbs,  non-prescription drugs, or dietary supplements you use. Also tell them if you smoke, drink alcohol, or use illegal drugs. Some items may interact with your medicine. What should I watch for while using this medicine? Visit your doctor or health care professional for regular checks on your progress. Tell your health care professional if your symptoms do not start to get better or if they get worse. Do not stop taking except on your health care professional's advice. You may develop a severe reaction. Your health care professional  will tell you how much medicine to take. Wear a medical ID bracelet or chain. Carry a card that describes your disease and details of your medicine and dosage times. This medicine can reduce the response of your body to heat or cold. Dress warm in cold weather and stay hydrated in hot weather. If possible, avoid extreme temperatures like saunas, hot tubs, very hot or cold showers, or activities that can cause dehydration such as vigorous exercise. Check with your health care professional if you have severe diarrhea, nausea, and vomiting, or if you sweat a lot. The loss of too much body fluid may make it dangerous for you to take this medicine. You may get drowsy or dizzy. Do not drive, use machinery, or do anything that needs mental alertness until you know how this medicine affects you. Do not stand up or sit up quickly, especially if you are an older patient. This reduces the risk of dizzy or fainting spells. Alcohol may interfere with the effect of this medicine. Avoid alcoholic drinks. Tell your health care professional right away if you have any change in your eyesight. Patients and their families should watch out for new or worsening depression or thoughts of suicide. Also watch out for sudden changes in feelings such as feeling anxious, agitated, panicky, irritable, hostile, aggressive, impulsive, severely restless, overly excited and hyperactive, or not being able to sleep. If this happens, especially at the beginning of treatment or after a change in dose, call your healthcare professional. This medicine may cause serious skin reactions. They can happen weeks to months after starting the medicine. Contact your health care provider right away if you notice fevers or flu-like symptoms with a rash. The rash may be red or purple and then turn into blisters or peeling of the skin. Or, you might notice a red rash with swelling of the face, lips or lymph nodes in your neck or under your arms. Birth control  may not work properly while you are taking this medicine. Talk to your health care professional about using an extra method of birth control. Women should inform their health care professional if they wish to become pregnant or think they might be pregnant. There is a potential for serious side effects and harm to an unborn child. Talk to your health care professional for more information. What side effects may I notice from receiving this medicine? Side effects that you should report to your doctor or health care professional as soon as possible:  allergic reactions like skin rash, itching or hives, swelling of the face, lips, or tongue  blood in the urine  changes in vision  confusion  loss of memory  pain in lower back or side  pain when urinating  redness, blistering, peeling or loosening of the skin, including inside the mouth  signs and symptoms of bleeding such as bloody or black, tarry stools; red or dark brown urine; spitting up blood or brown material that looks like coffee grounds;  red spots on the skin; unusual bruising or bleeding from the eyes, gums, or nose  signs and symptoms of increased acid in the body like breathing fast; fast heartbeat; headache; confusion; unusually weak or tired; nausea, vomiting  suicidal thoughts, mood changes  trouble speaking or understanding  unusual sweating  unusually weak or tired Side effects that usually do not require medical attention (report to your doctor or health care professional if they continue or are bothersome):  dizziness  drowsiness  fever  loss of appetite  nausea, vomiting  pain, tingling, numbness in the hands or feet  stomach pain  tiredness  upset stomach This list may not describe all possible side effects. Call your doctor for medical advice about side effects. You may report side effects to FDA at 1-800-FDA-1088. Where should I keep my medicine? Keep out of the reach of children. Store at room  temperature between 15 and 30 degrees C (59 and 86 degrees F). Throw away any unused medicine after the expiration date. NOTE: This sheet is a summary. It may not cover all possible information. If you have questions about this medicine, talk to your doctor, pharmacist, or health care provider.  2020 Elsevier/Gold Standard (2018-10-16 15:07:20)

## 2019-11-12 ENCOUNTER — Telehealth: Payer: Self-pay | Admitting: Neurology

## 2019-11-12 NOTE — Telephone Encounter (Signed)
[employer] Auth: [id number] (exp. 11/12/19 to 05/09/20) order sent to GI. They will reach out to the patient to schedule.

## 2019-11-16 ENCOUNTER — Ambulatory Visit
Admission: RE | Admit: 2019-11-16 | Discharge: 2019-11-16 | Disposition: A | Discharge status: Home / Self Care | Payer: BC Managed Care – PPO | Source: Ambulatory Visit | Attending: Neurology | Admitting: Neurology

## 2019-11-16 ENCOUNTER — Other Ambulatory Visit: Payer: Self-pay

## 2019-11-16 DIAGNOSIS — R51 Headache with orthostatic component, not elsewhere classified: Secondary | ICD-10-CM

## 2019-11-16 DIAGNOSIS — G441 Vascular headache, not elsewhere classified: Secondary | ICD-10-CM

## 2019-11-16 DIAGNOSIS — R519 Headache, unspecified: Secondary | ICD-10-CM | POA: Diagnosis not present

## 2019-11-16 DIAGNOSIS — H532 Diplopia: Secondary | ICD-10-CM

## 2019-11-16 DIAGNOSIS — H4710 Unspecified papilledema: Secondary | ICD-10-CM

## 2019-11-16 DIAGNOSIS — H539 Unspecified visual disturbance: Secondary | ICD-10-CM

## 2019-11-16 DIAGNOSIS — F4323 Adjustment disorder with mixed anxiety and depressed mood: Secondary | ICD-10-CM | POA: Diagnosis not present

## 2019-11-16 MED ORDER — GADOBENATE DIMEGLUMINE 529 MG/ML IV SOLN
15.0000 mL | Freq: Once | INTRAVENOUS | Status: AC | PRN
  Administered 2019-11-16: 15 mL via INTRAVENOUS

## 2019-11-20 ENCOUNTER — Telehealth (HOSPITAL_COMMUNITY): Payer: Self-pay | Admitting: Emergency Medicine

## 2019-11-20 ENCOUNTER — Other Ambulatory Visit (INDEPENDENT_AMBULATORY_CARE_PROVIDER_SITE_OTHER): Payer: BC Managed Care – PPO

## 2019-11-20 ENCOUNTER — Other Ambulatory Visit: Payer: Self-pay

## 2019-11-20 DIAGNOSIS — E039 Hypothyroidism, unspecified: Secondary | ICD-10-CM | POA: Diagnosis not present

## 2019-11-20 LAB — TSH: TSH: 0.66 u[IU]/mL (ref 0.35–4.50)

## 2019-11-20 NOTE — Telephone Encounter (Signed)
Reaching out to patient to offer assistance regarding upcoming cardiac imaging study; pt verbalizes understanding of appt date/time, parking situation and where to check in, pre-test NPO status and medications ordered, and verified current allergies; name and call back number provided for further questions should they arise Rockwell Alexandria RN Navigator Cardiac Imaging [facility] [phone] office [phone] cell  Difficult IV/deep veins 20mg  propanolol 2 hr prior to scan

## 2019-11-23 ENCOUNTER — Telehealth: Payer: Self-pay

## 2019-11-23 ENCOUNTER — Other Ambulatory Visit: Payer: Self-pay

## 2019-11-23 ENCOUNTER — Ambulatory Visit (HOSPITAL_COMMUNITY)
Admission: RE | Admit: 2019-11-23 | Discharge: 2019-11-23 | Disposition: A | Discharge status: Home / Self Care | Payer: BC Managed Care – PPO | Source: Ambulatory Visit | Attending: Cardiology | Admitting: Cardiology

## 2019-11-23 DIAGNOSIS — I472 Ventricular tachycardia: Secondary | ICD-10-CM | POA: Diagnosis not present

## 2019-11-23 DIAGNOSIS — I4729 Other ventricular tachycardia: Secondary | ICD-10-CM

## 2019-11-23 DIAGNOSIS — R079 Chest pain, unspecified: Secondary | ICD-10-CM | POA: Insufficient documentation

## 2019-11-23 DIAGNOSIS — F4323 Adjustment disorder with mixed anxiety and depressed mood: Secondary | ICD-10-CM | POA: Diagnosis not present

## 2019-11-23 MED ORDER — NITROGLYCERIN 0.4 MG SL SUBL
SUBLINGUAL_TABLET | SUBLINGUAL | Status: AC
  Filled 2019-11-23: qty 2

## 2019-11-23 MED ORDER — NITROGLYCERIN 0.4 MG SL SUBL
0.8000 mg | SUBLINGUAL_TABLET | Freq: Once | SUBLINGUAL | Status: AC
  Administered 2019-11-23: 0.8 mg via SUBLINGUAL

## 2019-11-23 MED ORDER — IOHEXOL 350 MG/ML SOLN
80.0000 mL | Freq: Once | INTRAVENOUS | Status: AC | PRN
  Administered 2019-11-23: 80 mL via INTRAVENOUS

## 2019-11-23 MED ORDER — METOPROLOL TARTRATE 5 MG/5ML IV SOLN
INTRAVENOUS | Status: AC
  Filled 2019-11-23: qty 5

## 2019-11-23 MED ORDER — METOPROLOL TARTRATE 5 MG/5ML IV SOLN
5.0000 mg | INTRAVENOUS | Status: DC | PRN
  Administered 2019-11-23: 5 mg via INTRAVENOUS

## 2019-11-23 NOTE — Telephone Encounter (Signed)
Spoke with Dr. Servando Salina after receiving a call from [facility] regarding Cardiac CT. Dr. Servando Salina would like an appointment with the pt 8/24 in [place] if possible to discuss with the pt. VM to callback.

## 2019-11-24 ENCOUNTER — Encounter (INDEPENDENT_AMBULATORY_CARE_PROVIDER_SITE_OTHER): Payer: Self-pay | Admitting: Physician Assistant

## 2019-11-24 ENCOUNTER — Ambulatory Visit (INDEPENDENT_AMBULATORY_CARE_PROVIDER_SITE_OTHER): Payer: BC Managed Care – PPO | Admitting: Physician Assistant

## 2019-11-24 VITALS — BP 123/83 | HR 63 | Temp 98.2°F | Ht 67.0 in | Wt 357.0 lb

## 2019-11-24 DIAGNOSIS — Z6841 Body Mass Index (BMI) 40.0 and over, adult: Secondary | ICD-10-CM

## 2019-11-24 DIAGNOSIS — D508 Other iron deficiency anemias: Secondary | ICD-10-CM

## 2019-11-24 DIAGNOSIS — E785 Hyperlipidemia, unspecified: Secondary | ICD-10-CM | POA: Diagnosis not present

## 2019-11-24 NOTE — Progress Notes (Signed)
Chief Complaint:   OBESITY Margaret Golden is here to discuss her progress with her obesity treatment plan along with follow-up of her obesity related diagnoses. Roan is on the Category 3 Plan and states she is following her eating plan approximately 50-55% of the time. Arlet states she is exercising 0 minutes 0 times per week.  Today's visit was #: 6 Starting weight: 367 lbs Starting date: 09/03/2019 Today's weight: 357 lbs Today's date: 11/24/2019 Total lbs lost to date: 10 Total lbs lost since last in-office visit: 7  Interim History: Allyne did a great job implementing the changes from her last visit. She is eating at regular intervals and now writing her food in a journal. She is beginning to notice appropriate hunger throughout the day and her nausea has decreased.  Subjective:   Other iron deficiency anemia. Kadra is on iron twice daily. She reports fatigue intermittently and is managed by her PCP.  CBC Latest Ref Rng & Units 09/30/2019 08/26/2019 08/12/2019  WBC 4.0 - 10.5 K/uL 7.5 8.4 10.3  Hemoglobin 12.0 - 15.0 g/dL 8.4(T) 3.6(I) 10.6(L)  Hematocrit 36 - 46 % 32.7(L) 33.4(L) 38.3  Platelets 150 - 400 K/uL 214 234 249   Lab Results  Component Value Date   IRON 27 (L) 09/30/2019   TIBC 423 09/30/2019   FERRITIN 6 (L) 09/30/2019   Lab Results  Component Value Date   VITAMINB12 473 09/03/2019   Dyslipidemia. Last HDL was 39. Averie is on no medication and is not exercising.  Assessment/Plan:   Other iron deficiency anemia. Orders and follow up as documented in patient record. Ramiya will continue follow-up with her PCP.  Counseling . Iron is essential for our bodies to make red blood cells.  Reasons that someone may be deficient include: an iron-deficient diet (more likely in those following vegan or vegetarian diets), women with heavy menses, patients with GI disorders or poor absorption, patients that have had bariatric surgery, frequent blood donors,  patients with cancer, and patients with heart disease.   Marland Kitchen An iron supplement has been recommended. This is found over-the-counter.   Gaspar Cola foods include dark leafy greens, red and white meats, eggs, seafood, and beans.   . Certain foods and drinks prevent your body from absorbing iron properly. Avoid eating these foods in the same meal as iron-rich foods or with iron supplements. These foods include: coffee, black tea, and red wine; milk, dairy products, and foods that are high in calcium; beans and soybeans; whole grains.  . Constipation can be a side effect of iron supplementation. Increased water and fiber intake are helpful. Water goal: > 2 liters/day. Fiber goal: > 25 grams/day.  Dyslipidemia. Maelys will continue to follow the plan as directed.   Class 3 severe obesity with serious comorbidity and body mass index (BMI) of 50.0 to 59.9 in adult, unspecified obesity type (HCC).  Kaelen is currently in the action stage of change. As such, her goal is to continue with weight loss efforts. She has agreed to change plans and will now follow the Category 4 Plan and will journal 1800 calories and 115 grams of protein daily.   Exercise goals: For substantial health benefits, adults should do at least 150 minutes (2 hours and 30 minutes) a week of moderate-intensity, or 75 minutes (1 hour and 15 minutes) a week of vigorous-intensity aerobic physical activity, or an equivalent combination of moderate- and vigorous-intensity aerobic activity. Aerobic activity should be performed in episodes of at  least 10 minutes, and preferably, it should be spread throughout the week.  Behavioral modification strategies: meal planning and cooking strategies and keeping healthy foods in the home.  Madeleine has agreed to follow-up with our clinic in 2 weeks. She was informed of the importance of frequent follow-up visits to maximize her success with intensive lifestyle modifications for her multiple health conditions.    Objective:   Blood pressure 123/83, pulse 63, temperature 98.2 F (36.8 C), temperature source Oral, height 5\' 7"  (1.702 m), weight (!) 357 lb (161.9 kg), SpO2 95 %. Body mass index is 55.91 kg/m.  General: Cooperative, alert, well developed, in no acute distress. HEENT: Conjunctivae and lids unremarkable. Cardiovascular: Regular rhythm.  Lungs: Normal work of breathing. Neurologic: No focal deficits.   Lab Results  Component Value Date   CREATININE 0.74 09/30/2019   BUN 8 09/30/2019   NA 139 09/30/2019   K 3.9 09/30/2019   CL 107 09/30/2019   CO2 25 09/30/2019   Lab Results  Component Value Date   ALT 14 09/30/2019   AST 27 09/30/2019   ALKPHOS 69 09/30/2019   BILITOT 0.4 09/30/2019   Lab Results  Component Value Date   HGBA1C 4.9 09/03/2019   HGBA1C 5.0 07/30/2019   HGBA1C 4.6 07/06/2016   Lab Results  Component Value Date   INSULIN 29.2 (H) 09/03/2019   Lab Results  Component Value Date   TSH 0.66 11/20/2019   Lab Results  Component Value Date   CHOL 150 09/03/2019   HDL 39 (L) 09/03/2019   LDLCALC 89 09/03/2019   TRIG 120 09/03/2019   CHOLHDL 4 07/21/2019   Lab Results  Component Value Date   WBC 7.5 09/30/2019   HGB 9.1 (L) 09/30/2019   HCT 32.7 (L) 09/30/2019   MCV 73.0 (L) 09/30/2019   PLT 214 09/30/2019   Lab Results  Component Value Date   IRON 27 (L) 09/30/2019   TIBC 423 09/30/2019   FERRITIN 6 (L) 09/30/2019   Attestation Statements:   Reviewed by clinician on day of visit: allergies, medications, problem list, medical history, surgical history, family history, social history, and previous encounter notes.  Time spent on visit including pre-visit chart review and post-visit charting and care was 30 minutes.   I07/05/2019, am acting as transcriptionist for Marianna Payment, PA-C   I have reviewed the above documentation for accuracy and completeness, and I agree with the above. Alois Cliche, PA-C

## 2019-11-25 ENCOUNTER — Inpatient Hospital Stay: Payer: BC Managed Care – PPO | Admitting: Hematology & Oncology

## 2019-11-25 ENCOUNTER — Other Ambulatory Visit: Payer: Self-pay

## 2019-11-25 ENCOUNTER — Inpatient Hospital Stay: Payer: BC Managed Care – PPO

## 2019-11-25 ENCOUNTER — Inpatient Hospital Stay (HOSPITAL_BASED_OUTPATIENT_CLINIC_OR_DEPARTMENT_OTHER): Payer: BC Managed Care – PPO | Admitting: Family

## 2019-11-25 ENCOUNTER — Encounter: Payer: Self-pay | Admitting: Family

## 2019-11-25 ENCOUNTER — Telehealth: Payer: Self-pay | Admitting: Family

## 2019-11-25 VITALS — BP 126/83 | HR 100 | Ht 67.0 in | Wt 363.0 lb

## 2019-11-25 DIAGNOSIS — R591 Generalized enlarged lymph nodes: Secondary | ICD-10-CM

## 2019-11-25 DIAGNOSIS — D649 Anemia, unspecified: Secondary | ICD-10-CM

## 2019-11-25 DIAGNOSIS — N920 Excessive and frequent menstruation with regular cycle: Secondary | ICD-10-CM

## 2019-11-25 DIAGNOSIS — D500 Iron deficiency anemia secondary to blood loss (chronic): Secondary | ICD-10-CM

## 2019-11-25 LAB — CBC WITH DIFFERENTIAL (CANCER CENTER ONLY)
Abs Immature Granulocytes: 0.05 10*3/uL (ref 0.00–0.07)
Basophils Absolute: 0 10*3/uL (ref 0.0–0.1)
Basophils Relative: 0 %
Eosinophils Absolute: 0.2 10*3/uL (ref 0.0–0.5)
Eosinophils Relative: 3 %
HCT: 40.3 % (ref 36.0–46.0)
Hemoglobin: 12.5 g/dL (ref 12.0–15.0)
Immature Granulocytes: 1 %
Lymphocytes Relative: 25 %
Lymphs Abs: 1.9 10*3/uL (ref 0.7–4.0)
MCH: 25 pg — ABNORMAL LOW (ref 26.0–34.0)
MCHC: 31 g/dL (ref 30.0–36.0)
MCV: 80.4 fL (ref 80.0–100.0)
Monocytes Absolute: 0.5 10*3/uL (ref 0.1–1.0)
Monocytes Relative: 6 %
Neutro Abs: 5 10*3/uL (ref 1.7–7.7)
Neutrophils Relative %: 65 %
Platelet Count: 218 10*3/uL (ref 150–400)
RBC: 5.01 MIL/uL (ref 3.87–5.11)
RDW: 22.6 % — ABNORMAL HIGH (ref 11.5–15.5)
WBC Count: 7.7 10*3/uL (ref 4.0–10.5)
nRBC: 0 % (ref 0.0–0.2)

## 2019-11-25 LAB — RETICULOCYTES
Immature Retic Fract: 13.9 % (ref 2.3–15.9)
RBC.: 4.94 MIL/uL (ref 3.87–5.11)
Retic Count, Absolute: 139.3 10*3/uL (ref 19.0–186.0)
Retic Ct Pct: 2.8 % (ref 0.4–3.1)

## 2019-11-25 NOTE — Progress Notes (Signed)
Hematology and Oncology Follow Up Visit  Margaret Golden [id number] 08/02/1990 28 y.o. 11/25/2019   Principle Diagnosis:  Iron deficiency anemia secondary to heavy irregular cycles  Current Therapy:   IV iron as indicated    Interim History:  Margaret Golden is here today for follow-up. She states that she felt much better after reciving the two doses of IV iron. She had another heavy cycle and started noting symptoms again.  She has not noted any other blood loss. No bruising or petechiae. Hgb is 12.5, MCV 80, platelets 218 and WBC count 7.7.  She had a cardiac CT 2 days ago which showed new thoracic adenopathy suspicious for lymphoma as well new and enlarged pulmonary nodules. I spoke with her PCP this morning and they are setting her up for biopsy.  She is still having chest discomfort and SOB with any exertion.  She has episodes of dizziness if she stands or sits up to quickly. She states that she has history of vertigo.  No fever, chills, n/v, cough, rash, palpitations, abdominal pain or changes in bowel or bladder habits.  She notes swelling in her feet and ankles that comes and goes.  No numbness or tingling in her extremities sat this time. She occasionally notes in her fingers and toes.  No falls or syncopal episodes to report.  She has a good appetite but she does admit that she needs to better hydrate throughout the day.   ECOG Performance Status: 1 - Symptomatic but completely ambulatory  Medications:  Allergies as of 11/25/2019      Reactions   Augmentin [amoxicillin-pot Clavulanate] Hives      Medication List       Accurate as of November 25, 2019  1:32 PM. If you have any questions, ask your nurse or doctor.        albuterol 108 (90 Base) MCG/ACT inhaler Commonly known as: VENTOLIN HFA Inhale 2 puffs into the lungs every 6 (six) hours as needed for wheezing or shortness of breath (Chest tightness).   aspirin-acetaminophen-caffeine 250-250-65 MG tablet Commonly  known as: EXCEDRIN MIGRAINE Take 2 tablets by mouth every 6 (six) hours as needed for headache.   Fusion Plus Caps One capsule twice daily with orange juice. Stop your Ferrous sulfate.   levothyroxine 112 MCG tablet Commonly known as: SYNTHROID Take 1 tablet (112 mcg total) by mouth daily.   multivitamin with minerals tablet Take 1 tablet by mouth daily.   omeprazole 20 MG capsule Commonly known as: PRILOSEC Take 2 capsules (40 mg total) by mouth daily.   rizatriptan 10 MG disintegrating tablet Commonly known as: MAXALT-MLT Take 1 tablet (10 mg total) by mouth as needed for migraine. May repeat in 2 hours if needed   topiramate 50 MG tablet Commonly known as: Topamax Start with 50mg (1 tab) at bedtime. In 1 week increase to 100mg (2 tabs) at bedtime.   Vitamin D (Ergocalciferol) 1.25 MG (50000 UNIT) Caps capsule Commonly known as: DRISDOL Take 1 capsule (50,000 Units total) by mouth every 7 (seven) days.   VITAMIN D PO Take 50 mcg by mouth daily.       Allergies:  Allergies  Allergen Reactions  . Augmentin [Amoxicillin-Pot Clavulanate] Hives    Past Medical History, Surgical history, Social history, and Family History were reviewed and updated.  Review of Systems: All other 10 point review of systems is negative.   Physical Exam:  height is 5\' 7"  (1.702 m) and weight is 363 lb (164.7 kg) (abnormal). Her  blood pressure is 126/83 and her pulse is 100. Her oxygen saturation is 100%.   Wt Readings from Last 3 Encounters:  11/25/19 (!) 363 lb (164.7 kg)  11/24/19 (!) 357 lb (161.9 kg)  11/11/19 (!) 363 lb (164.7 kg)    Ocular: Sclerae unicteric, pupils equal, round and reactive to light Ear-nose-throat: Oropharynx clear, dentition fair Lymphatic: No cervical or supraclavicular adenopathy Lungs no rales or rhonchi, good excursion bilaterally Heart regular rate and rhythm, no murmur appreciated Abd soft, nontender, positive bowel sounds MSK no focal spinal  tenderness, no joint edema Neuro: non-focal, well-oriented, appropriate affect Breasts: Deferred   Lab Results  Component Value Date   WBC 7.7 11/25/2019   HGB 12.5 11/25/2019   HCT 40.3 11/25/2019   MCV 80.4 11/25/2019   PLT 218 11/25/2019   Lab Results  Component Value Date   FERRITIN 6 (L) 09/30/2019   IRON 27 (L) 09/30/2019   TIBC 423 09/30/2019   UIBC 396 (H) 09/30/2019   IRONPCTSAT 6 (L) 09/30/2019   Lab Results  Component Value Date   RETICCTPCT 2.8 11/25/2019   RBC 4.94 11/25/2019   RBC 5.01 11/25/2019   No results found for: KPAFRELGTCHN, LAMBDASER, KAPLAMBRATIO No results found for: IGGSERUM, IGA, IGMSERUM No results found for: Margaret Golden, MSPIKE, SPEI   Chemistry      Component Value Date/Time   NA 139 09/30/2019 1047   K 3.9 09/30/2019 1047   CL 107 09/30/2019 1047   CO2 25 09/30/2019 1047   BUN 8 09/30/2019 1047   CREATININE 0.74 09/30/2019 1047   CREATININE 0.75 07/06/2016 1534      Component Value Date/Time   CALCIUM 8.5 (L) 09/30/2019 1047   ALKPHOS 69 09/30/2019 1047   AST 27 09/30/2019 1047   ALT 14 09/30/2019 1047   BILITOT 0.4 09/30/2019 1047       Impression and Plan: Margaret Golden is a very pleasant 29 yo caucasian female with long history of iron deficiency anemia secondary to heavy irregular cycles.  We will see what her iron studies look like and replace if needed.  Unfortunately, her cardiac CT revealed new thoracic adenopathy as well as new and enlarged lung nodules. Her PCP has referred her to pulmonologist Dr. Vassie Loll for biopsy. IR did not feel there were nodes accessible for them to biopsy.  We will go ahead and plan to see her again in 8 weeks. We can certainly see her sooner if needed.  She can contact our office with any questions or concerns.   Emeline Gins, NP 8/25/20211:32 PM

## 2019-11-25 NOTE — Telephone Encounter (Signed)
I reviewed CT results with Dr. Jetty Peeks (interventional radiology). He did not think that there were any nodes that would be amenable for IR biopsy but possibly.  EBUS biopsy.  Will reach out to pulmonology to discuss.

## 2019-11-25 NOTE — Telephone Encounter (Signed)
Case reviewed with Dr. Vassie Loll (pulmonary). He recommends referral to his office for possible biopsy.  Contacted pt. Got a voicemail, left message advising pt to check her mychart message from me tonight for further instructions/details.

## 2019-11-26 ENCOUNTER — Telehealth: Payer: Self-pay | Admitting: Family

## 2019-11-26 LAB — IRON AND TIBC
Iron: 52 ug/dL (ref 41–142)
Saturation Ratios: 15 % — ABNORMAL LOW (ref 21–57)
TIBC: 351 ug/dL (ref 236–444)
UIBC: 299 ug/dL (ref 120–384)

## 2019-11-26 LAB — FERRITIN: Ferritin: 86 ng/mL (ref 11–307)

## 2019-11-26 NOTE — Telephone Encounter (Signed)
Appointments scheduled calendar printed & mailed per 8/25 los

## 2019-11-26 NOTE — Telephone Encounter (Signed)
I called and spoke with patient regarding iron infusion appointment that was added per 8/26 sch msg. She was ok with both date & time

## 2019-11-30 ENCOUNTER — Telehealth: Payer: Self-pay | Admitting: Family

## 2019-11-30 DIAGNOSIS — R590 Localized enlarged lymph nodes: Secondary | ICD-10-CM

## 2019-11-30 DIAGNOSIS — F4323 Adjustment disorder with mixed anxiety and depressed mood: Secondary | ICD-10-CM | POA: Diagnosis not present

## 2019-11-30 NOTE — Telephone Encounter (Signed)
Please contact pt and let her know that I called pulmonology and got her an appointment for 9/22 at 1:30 PM. Their number is [phone] and address is [address], [place], [place] [address].

## 2019-11-30 NOTE — Telephone Encounter (Signed)
Information given to patient over the phone and sent to her on a mychart message.

## 2019-11-30 NOTE — Telephone Encounter (Signed)
Also, let her know that I spoke to the pulmonologist who is going to see her (Dr. Francine Graven) and he would like her to have a complete CT chest prior to her appointment.  This will be with contrast. I am placing this order.

## 2019-12-01 ENCOUNTER — Inpatient Hospital Stay: Payer: BC Managed Care – PPO

## 2019-12-01 ENCOUNTER — Other Ambulatory Visit: Payer: Self-pay

## 2019-12-01 NOTE — Progress Notes (Signed)
PIV attempt on pt unsuccessful. Pt has very little access for IV. Pt advised she tried to drink water but cant. Pt request to reschedule IV Iron for different day

## 2019-12-02 ENCOUNTER — Ambulatory Visit (HOSPITAL_BASED_OUTPATIENT_CLINIC_OR_DEPARTMENT_OTHER)
Admission: RE | Admit: 2019-12-02 | Discharge: 2019-12-02 | Disposition: A | Discharge status: Home / Self Care | Payer: BC Managed Care – PPO | Source: Ambulatory Visit | Attending: Family | Admitting: Family

## 2019-12-02 ENCOUNTER — Encounter (HOSPITAL_BASED_OUTPATIENT_CLINIC_OR_DEPARTMENT_OTHER): Payer: Self-pay

## 2019-12-02 DIAGNOSIS — F4323 Adjustment disorder with mixed anxiety and depressed mood: Secondary | ICD-10-CM | POA: Diagnosis not present

## 2019-12-02 DIAGNOSIS — R590 Localized enlarged lymph nodes: Secondary | ICD-10-CM

## 2019-12-02 MED ORDER — IOHEXOL 300 MG/ML  SOLN: 100.0000 mL | Freq: Once | INTRAMUSCULAR | Status: DC | PRN

## 2019-12-04 ENCOUNTER — Ambulatory Visit (HOSPITAL_BASED_OUTPATIENT_CLINIC_OR_DEPARTMENT_OTHER): Payer: BC Managed Care – PPO

## 2019-12-04 ENCOUNTER — Inpatient Hospital Stay: Payer: BC Managed Care – PPO | Attending: Family

## 2019-12-04 ENCOUNTER — Telehealth: Payer: Self-pay | Admitting: Pulmonary Disease

## 2019-12-04 ENCOUNTER — Other Ambulatory Visit: Payer: Self-pay

## 2019-12-04 DIAGNOSIS — N920 Excessive and frequent menstruation with regular cycle: Secondary | ICD-10-CM | POA: Insufficient documentation

## 2019-12-04 DIAGNOSIS — D500 Iron deficiency anemia secondary to blood loss (chronic): Secondary | ICD-10-CM | POA: Insufficient documentation

## 2019-12-04 NOTE — Telephone Encounter (Signed)
-----   Message from Coralyn Helling, MD sent at 12/02/2019  3:17 PM EDT ----- Regarding: RE: Consult Appointment That's fine. ----- Message ----- From: Jennell Corner Sent: 12/02/2019   1:52 PM EDT To: Coralyn Helling, MD Subject: FW: Consult Appointment                        Good afternoon Dr. Craige Cotta,   Would you be willing to seeing this patient on 9/8 at the 12p slot? It is only a 15 minute slot.   Thanks,  Patrice  ----- Message ----- From: Pilar Grammes, RN Sent: 12/01/2019   5:39 PM EDT To: Jennell Corner, Martina Sinner, MD Subject: RE: Consult Appointment                        Dr. Craige Cotta might if we want to ask him  ----- Message ----- From: Jennell Corner Sent: 12/01/2019   4:53 PM EDT To: Pilar Grammes, RN, Martina Sinner, MD Subject: RE: Consult Appointment                        Dr. Craige Cotta and Marchelle Gearing both have 15 minute openings on 9/8 and Dr. Craige Cotta has a 15 minute on 9/10 - Lauren do you think either would be willing to see a consult in a 15 minute - I am going to put holds on those times just in case.   Thanks, Patrice  ----- Message ----- From: Pilar Grammes, RN Sent: 11/30/2019   3:03 PM EDT To: Sandra Cockayne, MD Subject: RE: Consult Appointment                        There isn't anything that I see off hand I can add to the 1130 on the 17th for you or the 4 same day. Otherwise you would have to see if anyone would allow a double book.   I will send this to [facility] as well, she is covering the front all week so may not be able to get to right away .   Patrice any further suggestions? Or spots aware of?  ----- Message ----- From: Martina Sinner, MD Sent: 11/30/2019   1:23 PM EDT To: Pilar Grammes, RN Subject: Consult Appointment                            Hi Lauren,  Would it be possible to have this patient be seen sooner in clinic by any provider for mediastinal lymphadenopathy? The primary team is concerned  for lymphoma.  Thanks, Cletis [place]

## 2019-12-04 NOTE — Telephone Encounter (Signed)
Called and left message on pt vm to call back to move up patient appt to 9/8 at 12pm -pr

## 2019-12-04 NOTE — Progress Notes (Signed)
Three attempts, by three different nurses, to start a PIV were unsuccessful.  Margaret Heck, NP talked to the pt. about getting a port-a-cath. Pt. stated that she does not want one. CT scan appointment was rescheduled because pt. said she does not want to be stuck again today.

## 2019-12-08 ENCOUNTER — Ambulatory Visit (INDEPENDENT_AMBULATORY_CARE_PROVIDER_SITE_OTHER): Payer: BC Managed Care – PPO | Admitting: Psychiatry

## 2019-12-08 ENCOUNTER — Encounter: Payer: Self-pay | Admitting: Psychiatry

## 2019-12-08 ENCOUNTER — Other Ambulatory Visit: Payer: Self-pay

## 2019-12-08 DIAGNOSIS — F4010 Social phobia, unspecified: Secondary | ICD-10-CM | POA: Diagnosis not present

## 2019-12-08 DIAGNOSIS — G4700 Insomnia, unspecified: Secondary | ICD-10-CM | POA: Diagnosis not present

## 2019-12-08 DIAGNOSIS — F332 Major depressive disorder, recurrent severe without psychotic features: Secondary | ICD-10-CM

## 2019-12-08 MED ORDER — LORAZEPAM 0.5 MG PO TABS: 0.5000 mg | ORAL_TABLET | Freq: Three times a day (TID) | ORAL | 1 refills | Status: DC

## 2019-12-08 MED ORDER — SERTRALINE HCL 100 MG PO TABS: ORAL_TABLET | ORAL | 0 refills | Status: DC

## 2019-12-08 NOTE — Progress Notes (Signed)
Margaret Golden [id number] 1990/11/06 28 y.o.  Subjective:   Patient ID:  Margaret Golden is a 29 y.o. (DOB 07/04/1990) female.  Chief Complaint:  Chief Complaint  Patient presents with  . Anxiety  . Depression    HPI Margaret Golden presents to the office today for follow-up of anxiety, depression, and sleep disturbance. She reports that she was released from [employer]. She reports that she has had medical work-up for constant chest pressure and was found to have enlarged lymph nodes which has led to further medical work-up. 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   She reports that last week she had some minor panic attacks with severe crying for the first time in awhile.  She reports that she has been experiencing increased heart rate and it is unclear if it is due to medical issues, anxiety, or a combination since she reports experiencing increased heart rate when she is not anxious and when she is at rest, however she reports that anxiety tends to exacerbate increased heart rate.  She reports "the anxiety has gotten a lot worse... I just thought my anxiety would never be as bad as it has been over the last few weeks." She reports that she had anxiety when someone mentioned she may need a port after she had multiple failed needle sticks since her mother had a port during her illness.  Margaret Golden reports that she is experiencing excessive worry and occasional catastrophic thinking. Recent rumination about medical issues.   "The depression is still there." Motivation has been low and reports that she has been letting things go in the house. Energy has been low and is receiving infusions for anemia. She reports that she has lost track of time in the last few months and attributes this to anemia since it occurred in the past. Concentration has been difficult. Losing interest in things. She reports altered sleep wake  cycle. Falling asleep around 3-4 am and sleeping until noon. Appetite has been up and down in response to stress. She reports that she has been to weight loss center. She reports occasional emotional eating. Has intentionally lost 10 lbs.  Denies SI.   She is now living alone since father moved out. Goes to game store in [place] almost every weekend. Has friends online. Continues to see Tina Griffiths, [facility] for therapy.   She reports that she has not been consistent with medications and pharmacist told her she had not filled medication in over 2 months.  Past medication trials: Sertraline-helpful for depression and anxiety Wellbutrin Lexapro-ineffective Cymbalta Pristiq Seroquel- does not recall any significant improvement Latuda Abilify Propanolol Melatonin Vyvanse Adderall XR- not effective Concerta- Notices duration only lasts about 4 hours. No significant improvement with doses less than 54 mg. Horizant Lamictal-effective for mood Trazodone-effective for insomnia but causes some excessive drowsiness Rexulti- May have caused n/v and poor concentration Adhansia- May have caused n/v and poor concentration  PHQ2-9     Office Visit from 09/03/2019 in [facility] Nutrition from [facility]10/2019 [facility] Office Visit from 05/02/2018 in [facility] Office Visit from 04/19/2017 in [facility] Nutrition from 02/20/2016 in [facility]  PHQ-2 Total Score PHQ-9 Total Score 14 -- 15 16 --  Review of Systems:  Review of Systems  Respiratory: Positive for shortness of breath.   Cardiovascular: Positive for chest pain and palpitations.       Has apt with cardiology tomorrow.  Genitourinary: Positive for menstrual problem.       Heavy menstrual bleeding  Hematological: Positive for adenopathy.  Has upcoming apt with  sleep specialist.  Has had intermittent RLS.    Medications: I have reviewed the patient's current medications.  Current Outpatient Medications  Medication Sig Dispense Refill  . albuterol (VENTOLIN HFA) 108 (90 Base) MCG/ACT inhaler Inhale 2 puffs into the lungs every 6 (six) hours as needed for wheezing or shortness of breath (Chest tightness). 18 g 0  . Iron-FA-B Cmp-C-Biot-Probiotic (FUSION PLUS) CAPS One capsule twice daily with orange juice. Stop your Ferrous sulfate. 30 capsule 1  . levothyroxine (SYNTHROID) 112 MCG tablet Take 1 tablet (112 mcg total) by mouth daily. 90 tablet 0  . Multiple Vitamins-Minerals (MULTIVITAMIN WITH MINERALS) tablet Take 1 tablet by mouth daily.    Marland Kitchen omeprazole (PRILOSEC) 20 MG capsule Take 2 capsules (40 mg total) by mouth daily. 180 capsule 2  . rizatriptan (MAXALT-MLT) 10 MG disintegrating tablet Take 1 tablet (10 mg total) by mouth as needed for migraine. May repeat in 2 hours if needed 9 tablet 11  . topiramate (TOPAMAX) 50 MG tablet Start with 50mg (1 tab) at bedtime. In 1 week increase to 100mg (2 tabs) at bedtime. 180 tablet 6  . VITAMIN D PO Take 50 mcg by mouth daily.     . Vitamin D, Ergocalciferol, (DRISDOL) 1.25 MG (50000 UNIT) CAPS capsule Take 1 capsule (50,000 Units total) by mouth every 7 (seven) days. 4 capsule 0  . furosemide (LASIX) 20 MG tablet Take 1 tablet (20 mg total) by mouth 2 (two) times a week. 26 tablet 0  . levothyroxine (SYNTHROID) 100 MCG tablet Take 100 mcg by mouth daily before breakfast.    . LORazepam (ATIVAN) 0.5 MG tablet Take 1 tablet (0.5 mg total) by mouth every 8 (eight) hours. 30 tablet 1  . potassium chloride SA (KLOR-CON) 20 MEQ tablet Take 1 tablet (20 mEq total) by mouth 2 (two) times a week. 26 tablet 0  . propranolol (INDERAL) 20 MG tablet Take 1 tablet (20 mg total) by mouth daily. 30 tablet 3  . sertraline (ZOLOFT) 100 MG tablet Take 0.5 tablets (50 mg total) by mouth daily for 7 days, THEN 1 tablet (100 mg  total) daily for 7 days, THEN 1.5 tablets (150 mg total) daily for 7 days, THEN 2 tablets (200 mg total) daily for 21 days. 60 tablet 0   No current facility-administered medications for this visit.    Medication Side Effects: None  Allergies:  Allergies  Allergen Reactions  . Augmentin [Amoxicillin-Pot Clavulanate] Hives    Past Medical History:  Diagnosis Date  . Acid reflux   . ADD (attention deficit disorder)   . Anemia   . Anxiety   . Arthritis    left ankle  . Back pain   . Chest pain   . Depression   . Dyspnea   . Exercise-induced asthma   . Fatty liver   . History of blood transfusion   . History of chicken pox   . History of kidney stones   . Hypothyroidism   . Joint pain   . Lower extremity edema   . Major depressive disorder   . Migraines    chronic  . Pneumonia   . RLS (  restless legs syndrome)   . Thyroid disease    hypothyroid    Family History  Problem Relation Age of Onset  . Depression Mother   . Obesity Mother   . Depression Father   . Sleep apnea Father   . Obesity Father   . Cancer Paternal Aunt        colon  . Cancer Paternal Uncle        brain cancer  . Diabetes Maternal Grandfather   . Arthritis Maternal Grandfather   . Arthritis Maternal Grandmother   . Breast cancer Maternal Grandmother   . Arthritis Paternal Grandmother   . Arthritis Paternal Grandfather   . Migraines Neg Hx     Social History   Socioeconomic History  . Marital status: Single    Spouse name: Not on file  . Number of children: 0  . Years of education: 58  . Highest education level: Not on file  Occupational History  . Occupation: Conservation officer, historic buildings  Tobacco Use  . Smoking status: Never Smoker  . Smokeless tobacco: Never Used  Vaping Use  . Vaping Use: Never used  Substance and Sexual Activity  . Alcohol use: Not Currently    Alcohol/week: 0.0 standard drinks  . Drug use: No  . Sexual activity: Never    Comment: never sexually active  Other  Topics Concern  . Not on file  Social History Narrative   Unemployed   Lives alone --update 11/11/19   Seeking work   Only child   Dog 3   Lives in a one story house with father, has a cat-12/26/16-sjb      Caffeine: 2-4 cups/day   Social Determinants of Health   Financial Resource Strain:   . Difficulty of Paying Living Expenses: Not on file  Food Insecurity:   . Worried About Programme researcher, broadcasting/film/video in the Last Year: Not on file  . Ran Out of Food in the Last Year: Not on file  Transportation Needs:   . Lack of Transportation (Medical): Not on file  . Lack of Transportation (Non-Medical): Not on file  Physical Activity:   . Days of Exercise per Week: Not on file  . Minutes of Exercise per Session: Not on file  Stress:   . Feeling of Stress : Not on file  Social Connections:   . Frequency of Communication with Friends and Family: Not on file  . Frequency of Social Gatherings with Friends and Family: Not on file  . Attends Religious Services: Not on file  . Active Member of Clubs or Organizations: Not on file  . Attends Banker Meetings: Not on file  . Marital Status: Not on file  Intimate Partner Violence:   . Fear of Current or Ex-Partner: Not on file  . Emotionally Abused: Not on file  . Physically Abused: Not on file  . Sexually Abused: Not on file    Past Medical History, Surgical history, Social history, and Family history were reviewed and updated as appropriate.   Please see review of systems for further details on the patient's review from today.   Objective:   Physical Exam:  LMP 11/18/2019   Physical Exam Constitutional:      General: She is not in acute distress. Musculoskeletal:        General: No deformity.  Neurological:     Mental Status: She is alert and oriented to person, place, and time.     Coordination: Coordination normal.  Psychiatric:  Attention and Perception: Attention and perception normal. She does not perceive  auditory or visual hallucinations.        Mood and Affect: Mood normal. Mood is not anxious or depressed. Affect is not labile, blunt, angry or inappropriate.        Speech: Speech normal.        Behavior: Behavior normal.        Thought Content: Thought content normal. Thought content is not paranoid or delusional. Thought content does not include homicidal or suicidal ideation. Thought content does not include homicidal or suicidal plan.        Cognition and Memory: Cognition and memory normal.        Judgment: Judgment normal.     Comments: Insight intact     Lab Review:     Component Value Date/Time   NA 139 09/30/2019 1047   K 3.9 09/30/2019 1047   CL 107 09/30/2019 1047   CO2 25 09/30/2019 1047   GLUCOSE 108 (H) 09/30/2019 1047   BUN 8 09/30/2019 1047   CREATININE 0.74 09/30/2019 1047   CREATININE 0.75 07/06/2016 1534   CALCIUM 8.5 (L) 09/30/2019 1047   PROT 6.8 09/30/2019 1047   ALBUMIN 3.9 09/30/2019 1047   AST 27 09/30/2019 1047   ALT 14 09/30/2019 1047   ALKPHOS 69 09/30/2019 1047   BILITOT 0.4 09/30/2019 1047   GFRNONAA >60 09/30/2019 1047   GFRAA >60 09/30/2019 1047       Component Value Date/Time   WBC 7.7 11/25/2019 1259   WBC 10.3 08/12/2019 1122   RBC 4.94 11/25/2019 1259   RBC 5.01 11/25/2019 1259   HGB 12.5 11/25/2019 1259   HGB 9.3 (L) 08/26/2019 1045   HCT 40.3 11/25/2019 1259   HCT 33.4 (L) 08/26/2019 1045   PLT 218 11/25/2019 1259   PLT 234 08/26/2019 1045   MCV 80.4 11/25/2019 1259   MCV 72 (L) 08/26/2019 1045   MCH 25.0 (L) 11/25/2019 1259   MCHC 31.0 11/25/2019 1259   RDW 22.6 (H) 11/25/2019 1259   RDW 15.4 08/26/2019 1045   LYMPHSABS 1.9 11/25/2019 1259   MONOABS 0.5 11/25/2019 1259   EOSABS 0.2 11/25/2019 1259   BASOSABS 0.0 11/25/2019 1259    No results found for: POCLITH, LITHIUM   No results found for: PHENYTOIN, PHENOBARB, VALPROATE, CBMZ   .res Assessment: Plan:   Discussed resuming sertraline since this has been most  effective for both her mood and anxiety signs and symptoms in the past.  Discussed gradually restarting sertraline to minimize side effects.  Will restart sertraline 50 mg daily for 1 week, then increase to 100 mg daily for 1 week, then increase to 150 mg daily for 1 week, then increase to 150 mg daily for 1 week, then increase to 200 mg daily. Discussed considering restarting lamotrigine in the future after re-starting Sertraline since this has been helpful for her mood and anxiety signs and symptoms in the past.  Discussed not immediately restarting lamotrigine since it can potentially cause lymphadenopathy and she is currently being evaluated for adenopathy, however she has not taken lamotrigine for several months and imaging is indicating thoracic adenopathy versus generalized lymphadenopathy.  Discussed starting medication to be used as needed for severe anxiety and panic since patient reports recently experiencing severe anxiety signs and symptoms and anticipates possible severe situational anxiety. Discussed potential benefits, risk, and side effects of benzodiazepines to include potential risk of tolerance and dependence, as well as possible drowsiness.  Advised patient  not to drive if experiencing drowsiness and to take lowest possible effective dose to minimize risk of dependence and tolerance. Will start low-dose lorazepam 0.5 mg every 8 hours as needed for severe anxiety. Recommend [facility]sim White[facility]. Patient to follow-up with this provider in 4 weeks or sooner if clinically indicated. Patient advised to contact office with any questions, adverse effects, or acute worsening in signs and symptoms.  Margaret Golden was seen today for anxiety and depression.  Diagnoses and all orders for this visit:  Social phobia -     sertraline (ZOLOFT) 100 MG tablet; Take 0.5 tablets (50 mg total) by mouth daily for 7 days, THEN 1 tablet (100 mg total) daily for 7 days, THEN 1.5 tablets  (150 mg total) daily for 7 days, THEN 2 tablets (200 mg total) daily for 21 days. -     LORazepam (ATIVAN) 0.5 MG tablet; Take 1 tablet (0.5 mg total) by mouth every 8 (eight) hours.  Severe episode of recurrent major depressive disorder, without psychotic features (HCC) -     sertraline (ZOLOFT) 100 MG tablet; Take 0.5 tablets (50 mg total) by mouth daily for 7 days, THEN 1 tablet (100 mg total) daily for 7 days, THEN 1.5 tablets (150 mg total) daily for 7 days, THEN 2 tablets (200 mg total) daily for 21 days.  Insomnia, unspecified type     Please see After Visit Summary for patient specific instructions.  Future Appointments  Date Time Provider Department Center  12/11/2019 11:00 AM [facility] A4 [facility] None  12/14/2019 12:30 PM Alois ClicheAguilar, Tracey, PA-C [facility] None  12/18/2019 11:45 AM Francine Gravenewald, Bettina GaviaJonathan B, MD [facility] None  12/21/2019  1:00 PM Dohmeier, Porfirio Mylararmen, MD [facility] None  01/05/2020 11:00 AM Corie Chiquitoarter, Vernesha Talbot, PMHNP CP-CP None  01/20/2020 12:40 PM [name]'Sullivan, Melissa, NP [facility]SW P[facility]  01/25/2020  1:15 PM [facility] LAB [facility] None  01/25/2020  1:45 PM [place], Brand MalesSarah M, NP [facility] None  02/16/2020  2:00 PM Butch PennyMillikan, Megan, NP [facility] None  03/04/2020 11:00 AM Tobb, Lavona MoundKardie, DO [facility]IGHPT None    No orders of the defined types were placed in this encounter.   -------------------------------

## 2019-12-08 NOTE — Telephone Encounter (Signed)
Called and spoke to patient - pt had conflicting appt on 9/8 - moved patient to 9/17 with Dr. Francine Graven -pr

## 2019-12-09 ENCOUNTER — Encounter: Payer: Self-pay | Admitting: Cardiology

## 2019-12-09 ENCOUNTER — Ambulatory Visit: Payer: BC Managed Care – PPO | Admitting: Cardiology

## 2019-12-09 VITALS — BP 152/92 | HR 98 | Ht 67.0 in | Wt 362.1 lb

## 2019-12-09 DIAGNOSIS — R002 Palpitations: Secondary | ICD-10-CM | POA: Diagnosis not present

## 2019-12-09 DIAGNOSIS — E782 Mixed hyperlipidemia: Secondary | ICD-10-CM | POA: Insufficient documentation

## 2019-12-09 DIAGNOSIS — I10 Essential (primary) hypertension: Secondary | ICD-10-CM

## 2019-12-09 DIAGNOSIS — R600 Localized edema: Secondary | ICD-10-CM

## 2019-12-09 DIAGNOSIS — I4729 Other ventricular tachycardia: Secondary | ICD-10-CM | POA: Insufficient documentation

## 2019-12-09 DIAGNOSIS — I472 Ventricular tachycardia: Secondary | ICD-10-CM | POA: Diagnosis not present

## 2019-12-09 MED ORDER — POTASSIUM CHLORIDE CRYS ER 20 MEQ PO TBCR: 20.0000 meq | EXTENDED_RELEASE_TABLET | ORAL | 0 refills | Status: AC

## 2019-12-09 MED ORDER — FUROSEMIDE 20 MG PO TABS: 20.0000 mg | ORAL_TABLET | ORAL | 0 refills | Status: DC

## 2019-12-09 MED ORDER — PROPRANOLOL HCL 20 MG PO TABS: 20.0000 mg | ORAL_TABLET | Freq: Every day | ORAL | 3 refills | Status: DC

## 2019-12-09 NOTE — Patient Instructions (Signed)
Medication Instructions:  Your physician has recommended you make the following change in your medication:  START: Propranolol 20 mg take one tablet by mouth daily.  START: Lasix 20 mg take one tablet by mouth on Tuesday and Saturday.  START: Potassium 20 meq take one tablet by mouth on Tuesday and Saturday  *If you need a refill on your cardiac medications before your next appointment, please call your pharmacy*   Lab Work: None If you have labs (blood work) drawn today and your tests are completely normal, you will receive your results only by: Marland Kitchen MyChart Message (if you have MyChart) OR . A paper copy in the mail If you have any lab test that is abnormal or we need to change your treatment, we will call you to review the results.   Testing/Procedures: None   Follow-Up: At [facility], you and your health needs are our priority.  As part of our continuing mission to provide you with exceptional heart care, we have created designated Provider Care Teams.  These Care Teams include your primary Cardiologist (physician) and Advanced Practice Providers (APPs -  Physician Assistants and Nurse Practitioners) who all work together to provide you with the care you need, when you need it.  We recommend signing up for the patient portal called "MyChart".  Sign up information is provided on this After Visit Summary.  MyChart is used to connect with patients for Virtual Visits (Telemedicine).  Patients are able to view lab/test results, encounter notes, upcoming appointments, etc.  Non-urgent messages can be sent to your provider as well.   To learn more about what you can do with MyChart, go to [email or web address].    Your next appointment:   3 month(s)  The format for your next appointment:   In Person  Provider:   Thomasene Ripple, DO   Other Instructions

## 2019-12-09 NOTE — Progress Notes (Signed)
Cardiology Office Note:    Date:  12/09/2019   ID:  Margaret Golden, DOB 01/30/1991, MRN [id number]  PCP:  [name], NP  Cardiologist:  Thomasene Ripple, DO  Electrophysiologist:  None   Referring MD: [name], NP   " I have been really anxious recently"  History of Present Illness:    Margaret Golden is a 29 y.o. female with a hx of hypertension, morbid obesity, anemia who initially presented on Aug 21, 2019 at that time she complained of chest pain and echocardiogram as well as a ZIO monitor was ordered for the patient. Her ZIO monitor did show evidence of nonsustained ventricular tachycardia and given her persistent chest pain I recommended patient undergo a coronary CTA.  Her echocardiogram though suboptimal with does show a normal EF.  In the interim the patient was able to get her coronary CTA done.  She is here today to discuss these results.  Past Medical History:  Diagnosis Date  . Acid reflux   . ADD (attention deficit disorder)   . Anemia   . Anxiety   . Arthritis    left ankle  . Back pain   . Chest pain   . Depression   . Dyspnea   . Exercise-induced asthma   . Fatty liver   . History of blood transfusion   . History of chicken pox   . History of kidney stones   . Hypothyroidism   . Joint pain   . Lower extremity edema   . Major depressive disorder   . Migraines    chronic  . Pneumonia   . RLS (restless legs syndrome)   . Thyroid disease    hypothyroid    Past Surgical History:  Procedure Laterality Date  . KNEE ARTHROSCOPY Right 2007  . TONSILLECTOMY  2010  . WISDOM TOOTH EXTRACTION      Current Medications: Current Meds  Medication Sig  . albuterol (VENTOLIN HFA) 108 (90 Base) MCG/ACT inhaler Inhale 2 puffs into the lungs every 6 (six) hours as needed for wheezing or shortness of breath (Chest tightness).  . Iron-FA-B Cmp-C-Biot-Probiotic (FUSION PLUS) CAPS One capsule twice daily with orange juice. Stop your Ferrous  sulfate.  . levothyroxine (SYNTHROID) 100 MCG tablet Take 100 mcg by mouth daily before breakfast.  . levothyroxine (SYNTHROID) 112 MCG tablet Take 1 tablet (112 mcg total) by mouth daily.  Marland Kitchen LORazepam (ATIVAN) 0.5 MG tablet Take 1 tablet (0.5 mg total) by mouth every 8 (eight) hours.  . Multiple Vitamins-Minerals (MULTIVITAMIN WITH MINERALS) tablet Take 1 tablet by mouth daily.  Marland Kitchen omeprazole (PRILOSEC) 20 MG capsule Take 2 capsules (40 mg total) by mouth daily.  . rizatriptan (MAXALT-MLT) 10 MG disintegrating tablet Take 1 tablet (10 mg total) by mouth as needed for migraine. May repeat in 2 hours if needed  . sertraline (ZOLOFT) 100 MG tablet Take 0.5 tablets (50 mg total) by mouth daily for 7 days, THEN 1 tablet (100 mg total) daily for 7 days, THEN 1.5 tablets (150 mg total) daily for 7 days, THEN 2 tablets (200 mg total) daily for 21 days.  Marland Kitchen topiramate (TOPAMAX) 50 MG tablet Start with 50mg (1 tab) at bedtime. In 1 week increase to 100mg (2 tabs) at bedtime.  VITAMIN D PO Take 50 mcg by mouth daily.   . Vitamin D, Ergocalciferol, (DRISDOL) 1.25 MG (50000 UNIT) CAPS capsule Take 1 capsule (50,000 Units total) by mouth every 7 (seven) days.     Allergies:  Augmentin [amoxicillin-pot clavulanate]   Social History   Socioeconomic History  . Marital status: Single    Spouse name: Not on file  . Number of children: 0  . Years of education: 17  . Highest education level: Not on file  Occupational History  . Occupation: Conservation officer, historic buildings  Tobacco Use  . Smoking status: Never Smoker  . Smokeless tobacco: Never Used  Vaping Use  . Vaping Use: Never used  Substance and Sexual Activity  . Alcohol use: Not Currently    Alcohol/week: 0.0 standard drinks  . Drug use: No  . Sexual activity: Never    Comment: never sexually active  Other Topics Concern  . Not on file  Social History Narrative   Unemployed   Lives alone --update 11/11/19   Seeking work   Only child   Dog 3   Lives  in a one story house with father, has a cat-12/26/16-sjb      Caffeine: 2-4 cups/day   Social Determinants of Health   Financial Resource Strain:   . Difficulty of Paying Living Expenses: Not on file  Food Insecurity:   . Worried About Programme researcher, broadcasting/film/video in the Last Year: Not on file  . Ran Out of Food in the Last Year: Not on file  Transportation Needs:   . Lack of Transportation (Medical): Not on file  . Lack of Transportation (Non-Medical): Not on file  Physical Activity:   . Days of Exercise per Week: Not on file  . Minutes of Exercise per Session: Not on file  Stress:   . Feeling of Stress : Not on file  Social Connections:   . Frequency of Communication with Friends and Family: Not on file  . Frequency of Social Gatherings with Friends and Family: Not on file  . Attends Religious Services: Not on file  . Active Member of Clubs or Organizations: Not on file  . Attends Banker Meetings: Not on file  . Marital Status: Not on file     Family History: The patient's family history includes Arthritis in her maternal grandfather, maternal grandmother, paternal grandfather, and paternal grandmother; Breast cancer in her maternal grandmother; Cancer in her paternal aunt and paternal uncle; Depression in her father and mother; Diabetes in her maternal grandfather; Obesity in her father and mother; Sleep apnea in her father. There is no history of Migraines.  ROS:   Review of Systems  Constitution: Negative for decreased appetite, fever and weight gain.  HENT: Negative for congestion, ear discharge, hoarse voice and sore throat.   Eyes: Negative for discharge, redness, vision loss in right eye and visual halos.  Cardiovascular: Negative for chest pain, dyspnea on exertion, leg swelling, orthopnea and palpitations.  Respiratory: Negative for cough, hemoptysis, shortness of breath and snoring.   Endocrine: Negative for heat intolerance and polyphagia.    Hematologic/Lymphatic: Negative for bleeding problem. Does not bruise/bleed easily.  Skin: Negative for flushing, nail changes, rash and suspicious lesions.  Musculoskeletal: Negative for arthritis, joint pain, muscle cramps, myalgias, neck pain and stiffness.  Gastrointestinal: Negative for abdominal pain, bowel incontinence, diarrhea and excessive appetite.  Genitourinary: Negative for decreased libido, genital sores and incomplete emptying.  Neurological: Negative for brief paralysis, focal weakness, headaches and loss of balance.  Psychiatric/Behavioral: Negative for altered mental status, depression and suicidal ideas.  Allergic/Immunologic: Negative for HIV exposure and persistent infections.    EKGs/Labs/Other Studies Reviewed:    The following studies were reviewed today:   EKG:  The  ekg ordered today demonstrates   CCTA IMPRESSION: November 23, 2019 1. No evidence of CAD, CADRADS = 0.  2. Coronary calcium score of 0. This was 0 percentile for age and sex matched control.  3. Normal coronary origin with right dominance.  4. Please note significant radiology findings of adenopathy that may explain chest pain.  Echocardiogram IMPRESSIONS  1. Left ventricular ejection fraction, by estimation, is 60 to 65%. The left ventricle has normal function. The left ventricle has no regional wall motion abnormalities. Left ventricular diastolic parameters were  normal.  2. Right ventricular systolic function is normal. The right ventricular size is normal.  3. The mitral valve is normal in structure. Trivial mitral valve regurgitation. No evidence of mitral stenosis.  4. The aortic valve is normal in structure. Aortic valve regurgitation is not visualized. No aortic stenosis is present.  5. The inferior vena cava is normal in size with greater than 50% respiratory variability, suggesting right atrial pressure of 3 mmHg.  Recent Labs: 09/30/2019: ALT 14; BUN 8; Creatinine 0.74;  Potassium 3.9; Sodium 139 11/20/2019: TSH 0.66 11/25/2019: Hemoglobin 12.5; Platelet Count 218  Recent Lipid Panel    Component Value Date/Time   CHOL 150 09/03/2019 1503   TRIG 120 09/03/2019 1503   HDL 39 (L) 09/03/2019 1503   CHOLHDL 4 07/21/2019 1139   VLDL 36.6 07/21/2019 1139   LDLCALC 89 09/03/2019 1503    Physical Exam:    VS:  BP (!) 152/92   Pulse 98   Ht 5\' 7"  (1.702 m)   Wt (!) 362 lb 1.3 oz (164.2 kg)   LMP 11/18/2019   SpO2 98%   BMI 56.71 kg/m     Wt Readings from Last 3 Encounters:  12/09/19 (!) 362 lb 1.3 oz (164.2 kg)  11/25/19 (!) 363 lb (164.7 kg)  11/24/19 (!) 357 lb (161.9 kg)     GEN: Well nourished, well developed in no acute distress HEENT: Normal NECK: No JVD; No carotid bruits LYMPHATICS: No lymphadenopathy CARDIAC: S1S2 noted,RRR, no murmurs, rubs, gallops RESPIRATORY:  Clear to auscultation without rales, wheezing or rhonchi  ABDOMEN: Soft, non-tender, non-distended, +bowel sounds, no guarding. EXTREMITIES: +2 bilateral leg edema edema, No cyanosis, no clubbing MUSCULOSKELETAL:  No deformity  SKIN: Warm and dry NEUROLOGIC:  Alert and oriented x 3, non-focal PSYCHIATRIC:  Normal affect, good insight  ASSESSMENT:    1. Palpitations   2. NSVT (nonsustained ventricular tachycardia) (HCC)   3. Essential hypertension   4. Morbid obesity (HCC)   5. Mixed hyperlipidemia    PLAN:    We discussed the report of her coronary CTA today.  She has no calcium score and no blockages.  However the noncardiac portion of her CT is concerning for thoracic adenopathy.  This could be the source of her chest pain.  This is suspicious for a lymphoproliferative process such as lymphoma and/or inflammatory process such as sarcoidosis.  These results were previously sent to her PCP who has arranged for the patient to see pulmonary.  I have also asked the patient that she may eventually need to see rheumatology but at this time is preferable for her to see  pulmonary first.  She still is experiencing palpitations given the NSVT, I started the patient on propanolol but she had not started this medication.  We discussed and she is agreeable to try propanolol.  She has significant bilateral leg edema which she has multifactorial given her morbid obesity, lymphedema can be a factor here as  well.  Therefore I am going to place her Lasix 20 mg on Tuesdays and Saturdays with potassium supplement.  The patient understands the need to lose weight with diet and exercise. We have discussed specific strategies for this.  The patient is in agreement with the above plan. The patient left the office in stable condition.  The patient will follow up in   Medication Adjustments/Labs and Tests Ordered: Current medicines are reviewed at length with the patient today.  Concerns regarding medicines are outlined above.  No orders of the defined types were placed in this encounter.  Meds ordered this encounter  Medications  . propranolol (INDERAL) 20 MG tablet    Sig: Take 1 tablet (20 mg total) by mouth daily.    Dispense:  30 tablet    Refill:  3  . furosemide (LASIX) 20 MG tablet    Sig: Take 1 tablet (20 mg total) by mouth 2 (two) times a week.    Dispense:  26 tablet    Refill:  0  . potassium chloride SA (KLOR-CON) 20 MEQ tablet    Sig: Take 1 tablet (20 mEq total) by mouth 2 (two) times a week.    Dispense:  26 tablet    Refill:  0    Patient Instructions  Medication Instructions:  Your physician has recommended you make the following change in your medication:  START: Propranolol 20 mg take one tablet by mouth daily.  START: Lasix 20 mg take one tablet by mouth on Tuesday and Saturday.  START: Potassium 20 meq take one tablet by mouth on Tuesday and Saturday  *If you need a refill on your cardiac medications before your next appointment, please call your pharmacy*   Lab Work: None If you have labs (blood work) drawn today and your tests are  completely normal, you will receive your results only by: Marland Kitchen MyChart Message (if you have MyChart) OR . A paper copy in the mail If you have any lab test that is abnormal or we need to change your treatment, we will call you to review the results.   Testing/Procedures: None   Follow-Up: At [facility], you and your health needs are our priority.  As part of our continuing mission to provide you with exceptional heart care, we have created designated Provider Care Teams.  These Care Teams include your primary Cardiologist (physician) and Advanced Practice Providers (APPs -  Physician Assistants and Nurse Practitioners) who all work together to provide you with the care you need, when you need it.  We recommend signing up for the patient portal called "MyChart".  Sign up information is provided on this After Visit Summary.  MyChart is used to connect with patients for Virtual Visits (Telemedicine).  Patients are able to view lab/test results, encounter notes, upcoming appointments, etc.  Non-urgent messages can be sent to your provider as well.   To learn more about what you can do with MyChart, go to [email or web address].    Your next appointment:   3 month(s)  The format for your next appointment:   In Person  Provider:   Thomasene Ripple, DO   Other Instructions      Adopting a Healthy Lifestyle.  Know what a healthy weight is for you (roughly BMI <25) and aim to maintain this   Aim for 7+ servings of fruits and vegetables daily   65-80+ fluid ounces of water or unsweet tea for healthy kidneys   Limit to max 1 drink of alcohol  per day; avoid smoking/tobacco   Limit animal fats in diet for cholesterol and heart health - choose grass fed whenever available   Avoid highly processed foods, and foods high in saturated/trans fats   Aim for low stress - take time to unwind and care for your mental health   Aim for 150 min of moderate intensity exercise weekly for heart  health, and weights twice weekly for bone health   Aim for 7-9 hours of sleep daily   When it comes to diets, agreement about the perfect plan isnt easy to find, even among the experts. Experts at the [facility] [employer] developed an idea known as the Healthy Eating Plate. Just imagine a plate divided into logical, healthy portions.   The emphasis is on diet quality:   Load up on vegetables and fruits - one-half of your plate: Aim for color and variety, and remember that potatoes dont count.   Go for whole grains - one-quarter of your plate: Whole wheat, barley, wheat berries, quinoa, oats, brown rice, and foods made with them. If you want pasta, go with whole wheat pasta.   Protein power - one-quarter of your plate: Fish, chicken, beans, and nuts are all healthy, versatile protein sources. Limit red meat.   The diet, however, does go beyond the plate, offering a few other suggestions.   Use healthy plant oils, such as olive, canola, soy, corn, sunflower and peanut. Check the labels, and avoid partially hydrogenated oil, which have unhealthy trans fats.   If youre thirsty, drink water. Coffee and tea are good in moderation, but skip sugary drinks and limit milk and dairy products to one or two daily servings.   The type of carbohydrate in the diet is more important than the amount. Some sources of carbohydrates, such as vegetables, fruits, whole grains, and beans-are healthier than others.   Finally, stay active  Signed, Thomasene Ripple, DO  12/09/2019 2:59 PM    [facility]

## 2019-12-10 DIAGNOSIS — F4323 Adjustment disorder with mixed anxiety and depressed mood: Secondary | ICD-10-CM | POA: Diagnosis not present

## 2019-12-11 ENCOUNTER — Inpatient Hospital Stay: Payer: BC Managed Care – PPO

## 2019-12-11 ENCOUNTER — Other Ambulatory Visit: Payer: Self-pay

## 2019-12-11 ENCOUNTER — Telehealth: Payer: Self-pay | Admitting: Family

## 2019-12-11 VITALS — BP 123/96 | HR 78 | Temp 98.7°F | Resp 18

## 2019-12-11 DIAGNOSIS — D500 Iron deficiency anemia secondary to blood loss (chronic): Secondary | ICD-10-CM | POA: Diagnosis not present

## 2019-12-11 DIAGNOSIS — N920 Excessive and frequent menstruation with regular cycle: Secondary | ICD-10-CM | POA: Diagnosis not present

## 2019-12-11 MED ORDER — SODIUM CHLORIDE 0.9 % IV SOLN
510.0000 mg | Freq: Once | INTRAVENOUS | Status: AC
  Administered 2019-12-11: 510 mg via INTRAVENOUS
  Filled 2019-12-11: qty 510

## 2019-12-11 MED ORDER — SODIUM CHLORIDE 0.9 % IV SOLN
Freq: Once | INTRAVENOUS | Status: AC
  Filled 2019-12-11: qty 250

## 2019-12-11 NOTE — Telephone Encounter (Signed)
I am sorry to hear she had difficulty with IV access. It is necessary that we have a contrasted scan to fully evaluate the mediastinum adequately. If possible, could the difficult IV access team be notified of her next CT scan in order for them to attempt to place an IV or a midline.

## 2019-12-11 NOTE — Telephone Encounter (Signed)
I spoke to the patient as I saw the CT chest was not performed today as planned. She told me that the IV infusion was performed but they were only able to get an IV in her hand and therefore could not send her for CT.  I discussed the importance of the CT and advised her to call the imaging center to reschedule.  She verbalizes understanding and agrees to plan.   Dr. Francine Graven- she is an extremely hard stick.  Last time imaging tried to place IV they were unsuccessful. If they are unsuccessful again, would an non-contrast CT chest be of any use?  She sees you on 9/17 for consult.

## 2019-12-11 NOTE — Patient Instructions (Signed)

## 2019-12-11 NOTE — Telephone Encounter (Signed)
Windell Moulding, could you please call CT at [facility] and get her CT Chest rescheduled at [facility] instead of [facility]. Let's try to get this scheduled on the 16th or sooner next week.   Please ask the scheduler to make a note that the patient is a difficulty stick and they may need to utilize the IV team.

## 2019-12-14 ENCOUNTER — Other Ambulatory Visit: Payer: Self-pay

## 2019-12-14 ENCOUNTER — Encounter (INDEPENDENT_AMBULATORY_CARE_PROVIDER_SITE_OTHER): Payer: Self-pay | Admitting: Physician Assistant

## 2019-12-14 ENCOUNTER — Ambulatory Visit (INDEPENDENT_AMBULATORY_CARE_PROVIDER_SITE_OTHER): Payer: BC Managed Care – PPO | Admitting: Physician Assistant

## 2019-12-14 VITALS — BP 123/83 | HR 95 | Temp 98.4°F | Ht 67.0 in | Wt 362.0 lb

## 2019-12-14 DIAGNOSIS — Z9189 Other specified personal risk factors, not elsewhere classified: Secondary | ICD-10-CM | POA: Diagnosis not present

## 2019-12-14 DIAGNOSIS — E785 Hyperlipidemia, unspecified: Secondary | ICD-10-CM | POA: Diagnosis not present

## 2019-12-14 DIAGNOSIS — E559 Vitamin D deficiency, unspecified: Secondary | ICD-10-CM

## 2019-12-14 DIAGNOSIS — Z6841 Body Mass Index (BMI) 40.0 and over, adult: Secondary | ICD-10-CM

## 2019-12-14 MED ORDER — VITAMIN D (ERGOCALCIFEROL) 1.25 MG (50000 UNIT) PO CAPS: 50000.0000 [IU] | ORAL_CAPSULE | ORAL | 0 refills | Status: DC

## 2019-12-14 NOTE — Telephone Encounter (Signed)
Patient scheduled for Thursday 15th at 3 pm. Appointment date time and location given to patient, advised to be at [facility] cone at 2:45 to register and to not eat anything solid 4 hours prior to ct.

## 2019-12-15 NOTE — Progress Notes (Signed)
Chief Complaint:   OBESITY Margaret Golden is here to discuss her progress with her obesity treatment plan along with follow-up of her obesity related diagnoses. Margaret Golden is on the Category 3 Plan and states she is following her eating plan approximately 20% of the time. Margaret Golden states she is exercising for 0 minutes 0 times per week.  Today's visit was #: 7 Starting weight: 367 lbs Starting date: 09/03/2019 Today's weight: 362 lbs Today's date: 12/14/2019 Total lbs lost to date: 5 lbs Total lbs lost since last in-office visit: 0  Interim History: Margaret Golden reports that she has been comfort eating since finding out that she may have cancer.  She has a follow-up with Pulmonology on Friday.  She also notes skipping meals due to stress.  Subjective:   1. Dyslipidemia Khaya has dyslipidemia and has been trying to improve her cholesterol levels with intensive lifestyle modification including a low saturated fat diet, exercise and weight loss. She denies any chest pain, claudication or myalgias.  She is on no medication for this and is not exercising.  Last HDL was not at goal.  Lab Results  Component Value Date   ALT 14 09/30/2019   AST 27 09/30/2019   ALKPHOS 69 09/30/2019   BILITOT 0.4 09/30/2019   Lab Results  Component Value Date   CHOL 150 09/03/2019   HDL 39 (L) 09/03/2019   LDLCALC 89 09/03/2019   TRIG 120 09/03/2019   CHOLHDL 4 07/21/2019   2. Vitamin D deficiency Margaret Golden's Vitamin D level was 16.1 on 09/03/2019. She is currently taking prescription vitamin D 50,000 IU each week. She denies nausea, vomiting or muscle weakness.  3. At risk for activity intolerance Margaret Golden is at risk for exercise intolerance.  Assessment/Plan:   1. Dyslipidemia Cardiovascular risk and specific lipid/LDL goals reviewed.  We discussed several lifestyle modifications today and Margaret Golden will continue to work on diet, exercise and weight loss efforts. Orders and follow up as documented in patient record.   Start walking 1-2 times per week.  Counseling Intensive lifestyle modifications are the first line treatment for this issue. . Dietary changes: Increase soluble fiber. Decrease simple carbohydrates. . Exercise changes: Moderate to vigorous-intensity aerobic activity 150 minutes per week if tolerated. . Lipid-lowering medications: see documented in medical record.  2. Vitamin D deficiency Low Vitamin D level contributes to fatigue and are associated with obesity, breast, and colon cancer. She agrees to continue to take prescription Vitamin D @50 ,000 IU every week and will follow-up for routine testing of Vitamin D, at least 2-3 times per year to avoid over-replacement.  -Refill Vitamin D, Ergocalciferol, (DRISDOL) 1.25 MG (50000 UNIT) CAPS capsule; Take 1 capsule (50,000 Units total) by mouth every 7 (seven) days.  Dispense: 4 capsule; Refill: 0  3. At risk for activity intolerance Margaret Golden was given approximately 15 minutes of exercise intolerance counseling today. She is 29 y.o. female and has risk factors exercise intolerance including obesity. We discussed intensive lifestyle modifications today with an emphasis on specific weight loss instructions and strategies. Margaret Golden will slowly increase activity as tolerated.  Repetitive spaced learning was employed today to elicit superior memory formation and behavioral change.  4. Class 3 severe obesity with serious comorbidity and body mass index (BMI) of 50.0 to 59.9 in adult, unspecified obesity type (HCC) Margaret Golden is currently in the action stage of change. As such, her goal is to continue with weight loss efforts. She has agreed to the Category 3 Plan.   Exercise goals: All  adults should avoid inactivity. Some physical activity is better than none, and adults who participate in any amount of physical activity gain some health benefits.  Behavioral modification strategies: increasing lean protein intake and no skipping meals.  Margaret Golden has agreed to  follow-up with our clinic in 2 weeks. She was informed of the importance of frequent follow-up visits to maximize her success with intensive lifestyle modifications for her multiple health conditions.   Objective:   Blood pressure 123/83, pulse 95, temperature 98.4 F (36.9 C), temperature source Oral, height 5\' 7"  (1.702 m), weight (!) 362 lb (164.2 kg), last menstrual period 11/18/2019, SpO2 98 %. Body mass index is 56.7 kg/m.  General: Cooperative, alert, well developed, in no acute distress. HEENT: Conjunctivae and lids unremarkable. Cardiovascular: Regular rhythm.  Lungs: Normal work of breathing. Neurologic: No focal deficits.   Lab Results  Component Value Date   CREATININE 0.74 09/30/2019   BUN 8 09/30/2019   NA 139 09/30/2019   K 3.9 09/30/2019   CL 107 09/30/2019   CO2 25 09/30/2019   Lab Results  Component Value Date   ALT 14 09/30/2019   AST 27 09/30/2019   ALKPHOS 69 09/30/2019   BILITOT 0.4 09/30/2019   Lab Results  Component Value Date   HGBA1C 4.9 09/03/2019   HGBA1C 5.0 07/30/2019   HGBA1C 4.6 07/06/2016   Lab Results  Component Value Date   INSULIN 29.2 (H) 09/03/2019   Lab Results  Component Value Date   TSH 0.66 11/20/2019   Lab Results  Component Value Date   CHOL 150 09/03/2019   HDL 39 (L) 09/03/2019   LDLCALC 89 09/03/2019   TRIG 120 09/03/2019   CHOLHDL 4 07/21/2019   Lab Results  Component Value Date   WBC 7.7 11/25/2019   HGB 12.5 11/25/2019   HCT 40.3 11/25/2019   MCV 80.4 11/25/2019   PLT 218 11/25/2019   Lab Results  Component Value Date   IRON 52 11/25/2019   TIBC 351 11/25/2019   FERRITIN 86 11/25/2019   Attestation Statements:   Reviewed by clinician on day of visit: allergies, medications, problem list, medical history, surgical history, family history, social history, and previous encounter notes.  I, 11/27/2019, CMA, am acting as transcriptionist for Insurance claims handler, PA-C  I have reviewed the above  documentation for accuracy and completeness, and I agree with the above. Alois Cliche, PA-C

## 2019-12-16 DIAGNOSIS — F4323 Adjustment disorder with mixed anxiety and depressed mood: Secondary | ICD-10-CM | POA: Diagnosis not present

## 2019-12-17 ENCOUNTER — Ambulatory Visit (HOSPITAL_COMMUNITY)
Admission: RE | Admit: 2019-12-17 | Discharge: 2019-12-17 | Disposition: A | Discharge status: Home / Self Care | Payer: BC Managed Care – PPO | Source: Ambulatory Visit | Attending: Family | Admitting: Family

## 2019-12-17 DIAGNOSIS — R918 Other nonspecific abnormal finding of lung field: Secondary | ICD-10-CM | POA: Diagnosis not present

## 2019-12-17 DIAGNOSIS — R590 Localized enlarged lymph nodes: Secondary | ICD-10-CM | POA: Insufficient documentation

## 2019-12-17 DIAGNOSIS — R911 Solitary pulmonary nodule: Secondary | ICD-10-CM | POA: Diagnosis not present

## 2019-12-17 MED ORDER — IOHEXOL 300 MG/ML  SOLN
75.0000 mL | Freq: Once | INTRAMUSCULAR | Status: AC | PRN
  Administered 2019-12-17: 75 mL via INTRAVENOUS

## 2019-12-18 ENCOUNTER — Ambulatory Visit: Payer: BC Managed Care – PPO | Admitting: Pulmonary Disease

## 2019-12-18 ENCOUNTER — Other Ambulatory Visit: Payer: Self-pay

## 2019-12-18 ENCOUNTER — Encounter: Payer: Self-pay | Admitting: Pulmonary Disease

## 2019-12-18 VITALS — BP 134/72 | HR 107 | Temp 98.4°F | Ht 67.0 in | Wt 367.2 lb

## 2019-12-18 DIAGNOSIS — R918 Other nonspecific abnormal finding of lung field: Secondary | ICD-10-CM | POA: Diagnosis not present

## 2019-12-18 DIAGNOSIS — R0600 Dyspnea, unspecified: Secondary | ICD-10-CM | POA: Diagnosis not present

## 2019-12-18 DIAGNOSIS — R590 Localized enlarged lymph nodes: Secondary | ICD-10-CM

## 2019-12-18 NOTE — Progress Notes (Signed)
Synopsis: Referred by [name], NP for mediastinal lymphadenopathy  Subjective:   PATIENT ID: Margaret Golden GENDER: female DOB: 12-23-1990, MRN: [id number]   HPI  Chief Complaint  Patient presents with  . Consult    referred for large lymph nodes found in chest from cardiac testing, SOB with exertion, rest, and talking    Margaret Golden is a 29 year old woman, non-smoker with history of anemia, hypertension and obesity who is referred to pulmonary clinic for chest discomfort, shortness of breath and mediastinal lymphadenopathy.   She has developed chest discomfort and shortness of breath over the past few months. She was hospitalized in 06/2019 and found to be anemic due to heavy menses. She received blood transfusions which improved her shortness of breath. She has been followed by hematology for iron infusions and her most recent labs (11/2019) show her hemoglobin levels in the normal range. The heavy menses is thought secondary to a thickened endometrial lining which she had plans with OB/GYN for a procedure to thin the lining but was sent to cardiology for pre-procedure evaluation due to the chest discomfort and dyspnea. She reports palpitations and waking up short of breath at night. She is unable to lay on her back due to shortness of breath and she sleeps on her side.   She has been evaluated by cardiology and a cardiac CT was performed which was notable for mediastinal and hilar lymphadenopathy. A dedicated Chest CT with contrast was performed which shows mildly enlarged mediastinal and hilar lymph nodes along with multiple lung nodules. Right lower lobe 62mm nodule abuts the pleura. Left lower lobe 39mm nodule and a two contiguous nodules of the anterior right upper lobe measuring 7x23mm. EKG shows normal sinus rhythm with normal intervals. ECHO appears normal.   Past Medical History:  Diagnosis Date  . Acid reflux   . ADD (attention deficit disorder)   . Anemia   .  Anxiety   . Arthritis    left ankle  . Back pain   . Chest pain   . Depression   . Dyspnea   . Exercise-induced asthma   . Fatty liver   . History of blood transfusion   . History of chicken pox   . History of kidney stones   . Hypothyroidism   . Joint pain   . Lower extremity edema   . Major depressive disorder   . Migraines    chronic  . Pneumonia   . RLS (restless legs syndrome)   . Thyroid disease    hypothyroid     Family History  Problem Relation Age of Onset  . Depression Mother   . Obesity Mother   . Depression Father   . Sleep apnea Father   . Obesity Father   . Cancer Paternal Aunt        colon  . Cancer Paternal Uncle        brain cancer  . Diabetes Maternal Grandfather   . Arthritis Maternal Grandfather   . Arthritis Maternal Grandmother   . Breast cancer Maternal Grandmother   . Arthritis Paternal Grandmother   . Arthritis Paternal Grandfather   . Migraines Neg Hx      Social History   Socioeconomic History  . Marital status: Single    Spouse name: Not on file  . Number of children: 0  . Years of education: 5  . Highest education level: Not on file  Occupational History  . Occupation: Conservation officer, historic buildings  Tobacco Use  .  Smoking status: Never Smoker  . Smokeless tobacco: Never Used  Vaping Use  . Vaping Use: Never used  Substance and Sexual Activity  . Alcohol use: Not Currently    Alcohol/week: 0.0 standard drinks  . Drug use: No  . Sexual activity: Never    Comment: never sexually active  Other Topics Concern  . Not on file  Social History Narrative   Unemployed   Lives alone --update 11/11/19   Seeking work   Only child   Dog 3   Lives in a one story house with father, has a cat-12/26/16-sjb      Caffeine: 2-4 cups/day   Social Determinants of Health   Financial Resource Strain:   . Difficulty of Paying Living Expenses: Not on file  Food Insecurity:   . Worried About Programme researcher, broadcasting/film/videounning Out of Food in the Last Year: Not on file  .  Ran Out of Food in the Last Year: Not on file  Transportation Needs:   . Lack of Transportation (Medical): Not on file  . Lack of Transportation (Non-Medical): Not on file  Physical Activity:   . Days of Exercise per Week: Not on file  . Minutes of Exercise per Session: Not on file  Stress:   . Feeling of Stress : Not on file  Social Connections:   . Frequency of Communication with Friends and Family: Not on file  . Frequency of Social Gatherings with Friends and Family: Not on file  . Attends Religious Services: Not on file  . Active Member of Clubs or Organizations: Not on file  . Attends BankerClub or Organization Meetings: Not on file  . Marital Status: Not on file  Intimate Partner Violence:   . Fear of Current or Ex-Partner: Not on file  . Emotionally Abused: Not on file  . Physically Abused: Not on file  . Sexually Abused: Not on file     Allergies  Allergen Reactions  . Augmentin [Amoxicillin-Pot Clavulanate] Hives     Outpatient Medications Prior to Visit  Medication Sig Dispense Refill  . albuterol (VENTOLIN HFA) 108 (90 Base) MCG/ACT inhaler Inhale 2 puffs into the lungs every 6 (six) hours as needed for wheezing or shortness of breath (Chest tightness). 18 g 0  . furosemide (LASIX) 20 MG tablet Take 1 tablet (20 mg total) by mouth 2 (two) times a week. 26 tablet 0  . Iron-FA-B Cmp-C-Biot-Probiotic (FUSION PLUS) CAPS One capsule twice daily with orange juice. Stop your Ferrous sulfate. 30 capsule 1  . levothyroxine (SYNTHROID) 100 MCG tablet Take 100 mcg by mouth daily before breakfast.    . levothyroxine (SYNTHROID) 112 MCG tablet Take 1 tablet (112 mcg total) by mouth daily. 90 tablet 0  . LORazepam (ATIVAN) 0.5 MG tablet Take 1 tablet (0.5 mg total) by mouth every 8 (eight) hours. 30 tablet 1  . Multiple Vitamins-Minerals (MULTIVITAMIN WITH MINERALS) tablet Take 1 tablet by mouth daily.    Marland Kitchen. omeprazole (PRILOSEC) 20 MG capsule Take 2 capsules (40 mg total) by mouth daily.  180 capsule 2  . potassium chloride SA (KLOR-CON) 20 MEQ tablet Take 1 tablet (20 mEq total) by mouth 2 (two) times a week. 26 tablet 0  . propranolol (INDERAL) 20 MG tablet Take 1 tablet (20 mg total) by mouth daily. 30 tablet 3  . rizatriptan (MAXALT-MLT) 10 MG disintegrating tablet Take 1 tablet (10 mg total) by mouth as needed for migraine. May repeat in 2 hours if needed 9 tablet 11  . topiramate (TOPAMAX)  50 MG tablet Start with 50mg (1 tab) at bedtime. In 1 week increase to 100mg (2 tabs) at bedtime. (Patient taking differently: Take 100 mg by mouth at bedtime. ) 180 tablet 6  . Vitamin D, Ergocalciferol, (DRISDOL) 1.25 MG (50000 UNIT) CAPS capsule Take 1 capsule (50,000 Units total) by mouth every 7 (seven) days. 4 capsule 0  . sertraline (ZOLOFT) 100 MG tablet Take 0.5 tablets (50 mg total) by mouth daily for 7 days, THEN 1 tablet (100 mg total) daily for 7 days, THEN 1.5 tablets (150 mg total) daily for 7 days, THEN 2 tablets (200 mg total) daily for 21 days. 60 tablet 0  . VITAMIN D PO Take 50 mcg by mouth daily.      No facility-administered medications prior to visit.    Review of Systems  Constitutional: Negative for chills, diaphoresis, fever, malaise/fatigue and weight loss.  HENT: Negative for congestion, nosebleeds, sinus pain and sore throat.   Eyes: Negative for blurred vision and photophobia.  Respiratory: Positive for shortness of breath. Negative for cough, hemoptysis, sputum production and wheezing.   Cardiovascular: Positive for chest pain and palpitations. Negative for orthopnea, claudication, leg swelling and PND.  Gastrointestinal: Negative for abdominal pain, blood in stool, diarrhea, heartburn, melena, nausea and vomiting.  Genitourinary: Negative for dysuria, frequency and hematuria.  Musculoskeletal: Negative for joint pain and myalgias.  Skin: Negative for itching and rash.  Neurological: Negative for dizziness, weakness and headaches.  Endo/Heme/Allergies: Does  not bruise/bleed easily.  Psychiatric/Behavioral: The patient is nervous/anxious.       Objective:  Physical Exam Constitutional:      General: She is not in acute distress.    Appearance: She is obese. She is not ill-appearing.  HENT:     Head: Normocephalic and atraumatic.     Nose: Nose normal.     Mouth/Throat:     Mouth: Mucous membranes are moist.     Pharynx: Oropharynx is clear.  Eyes:     General: No scleral icterus.    Extraocular Movements: Extraocular movements intact.     Conjunctiva/sclera: Conjunctivae normal.     Pupils: Pupils are equal, round, and reactive to light.  Cardiovascular:     Rate and Rhythm: Regular rhythm. Tachycardia present.     Pulses: Normal pulses.     Heart sounds: Normal heart sounds. No murmur heard.   Pulmonary:     Effort: Pulmonary effort is normal.     Breath sounds: No wheezing, rhonchi or rales.     Comments: Diminished breath sounds Abdominal:     General: Bowel sounds are normal. There is no distension.     Palpations: Abdomen is soft.     Tenderness: There is no abdominal tenderness.  Musculoskeletal:     Cervical back: Neck supple.     Right lower leg: No edema.     Left lower leg: No edema.  Lymphadenopathy:     Cervical: No cervical adenopathy.  Skin:    General: Skin is warm and dry.  Neurological:     General: No focal deficit present.     Mental Status: She is alert and oriented to person, place, and time. Mental status is at baseline.     Gait: Gait normal.  Psychiatric:        Mood and Affect: Mood normal.        Behavior: Behavior normal.        Thought Content: Thought content normal.        Judgment: Judgment  normal.      Vitals:   12/18/19 1134  BP: 134/72  Pulse: (!) 107  Temp: 98.4 F (36.9 C)  TempSrc: Skin  SpO2: 98%  Weight: (!) 367 lb 3.2 oz (166.6 kg)  Height:  (1.702 m)    CBC    Component Value Date/Time   WBC 7.7 11/25/2019 1259   WBC 10.3 08/12/2019 1122   RBC 4.94  11/25/2019 1259   RBC 5.01 11/25/2019 1259   HGB 12.5 11/25/2019 1259   HGB 9.3 (L) 08/26/2019 1045   HCT 40.3 11/25/2019 1259   HCT 33.4 (L) 08/26/2019 1045   PLT 218 11/25/2019 1259   PLT 234 08/26/2019 1045   MCV 80.4 11/25/2019 1259   MCV 72 (L) 08/26/2019 1045   MCH 25.0 (L) 11/25/2019 1259   MCHC 31.0 11/25/2019 1259   RDW 22.6 (H) 11/25/2019 1259   RDW 15.4 08/26/2019 1045   LYMPHSABS 1.9 11/25/2019 1259   MONOABS 0.5 11/25/2019 1259   EOSABS 0.2 11/25/2019 1259   BASOSABS 0.0 11/25/2019 1259   BMP Latest Ref Rng & Units 09/30/2019 08/12/2019 06/29/2019  Glucose 70 - 99 mg/dL 119(J) 478(G) 98  BUN 6 - 20 mg/dL Creatinine 0.44 - 1.00 mg/dL 9.56 2.13 0.86  Sodium 135 - 145 mmol/L 139 137 140  Potassium 3.5 - 5.1 mmol/L 3.9 3.7 4.1  Chloride 98 - 111 mmol/L 107 104 105  CO2 22 - 32 mmol/L 25 21(L) 27  Calcium 8.9 - 10.3 mg/dL 5.7(Q) 9.0 9.1    Chest imaging: CT Chest 12/17/19  IMPRESSION: 1. No acute findings. 2. Mild mediastinal and bilateral hilar adenopathy, without change from the recent cardiac CT, new since the CT dated 06/29/2019. 3. Small scattered pulmonary nodules, stable from the prior cardiac CT, but new since the CT from 06/29/2019. 4. Stable mild splenomegaly. 5. Pulmonary nodules with associated mild adenopathy suggests an inflammatory etiology. Neoplastic disease is also in the differential. Short-term follow-up is recommended with repeat chest CT with contrast in 3 months.  PFT: No PFTs on file  Labs: Reviewed as above.  Echo: 09/14/19 EF 60-65%, Normal LV function. RV systolic function is normal. No valvular abnormalities.  Sleep Study: 01/2016 - AHI 3.5/hr -No central sleep apneas - Minimal to no oxygen desaturations  Assessment & Plan:   Mediastinal lymphadenopathy  Pulmonary nodules  Dyspnea, unspecified type  Class 3 severe obesity due to excess calories with serious comorbidity in adult, unspecified BMI  (HCC)  Discussion: Margaret Golden is a 29 year old woman, non-smoker who has been referred to pulmonary clinic for evaluation of shortness of breath, chest discomfort, mediastinal/hilar adenopathy and lung nodules.   She has undergone thorough cardiac evaluation for the chest discomfort and shortness of breath and it does not appear to be cardiac in origin. Given the radiographic appearance the leading differential is an inflammatory condition such as sarcoidosis. Other considerations include malignancy or infectious causes. Given the size of the mediastinal/hilar lymph nodes and the pulmonary nodules, the yield from EBUS or percutaneous biopsy would be low in the sense of risks and benefits. This was discussed with [name] and she expressed understanding. She has agreed to a 3 month follow up scan to monitor the nodules and lymphadenopathy.  In regards to the shortness of breath, we will order pulmonary function tests to determine if she would benefit from inhaler therapies. She had a sleep study in 2017 that did not indicate sleep apnea.   We have  encouraged further weight loss and she has been applauded for working with the weight management center.   She is to follow up in 3 months  Melody Comas, MD [facility] Office: [phone]   Current Outpatient Medications:  .  albuterol (VENTOLIN HFA) 108 (90 Base) MCG/ACT inhaler, Inhale 2 puffs into the lungs every 6 (six) hours as needed for wheezing or shortness of breath (Chest tightness)., Disp: 18 g, Rfl: 0 .  furosemide (LASIX) 20 MG tablet, Take 1 tablet (20 mg total) by mouth 2 (two) times a week., Disp: 26 tablet, Rfl: 0 .  Iron-FA-B Cmp-C-Biot-Probiotic (FUSION PLUS) CAPS, One capsule twice daily with orange juice. Stop your Ferrous sulfate., Disp: 30 capsule, Rfl: 1 .  levothyroxine (SYNTHROID) 100 MCG tablet, Take 100 mcg by mouth daily before breakfast., Disp: , Rfl:  .  levothyroxine (SYNTHROID) 112 MCG  tablet, Take 1 tablet (112 mcg total) by mouth daily., Disp: 90 tablet, Rfl: 0 .  LORazepam (ATIVAN) 0.5 MG tablet, Take 1 tablet (0.5 mg total) by mouth every 8 (eight) hours., Disp: 30 tablet, Rfl: 1 .  Multiple Vitamins-Minerals (MULTIVITAMIN WITH MINERALS) tablet, Take 1 tablet by mouth daily., Disp: , Rfl:  .  omeprazole (PRILOSEC) 20 MG capsule, Take 2 capsules (40 mg total) by mouth daily., Disp: 180 capsule, Rfl: 2 .  potassium chloride SA (KLOR-CON) 20 MEQ tablet, Take 1 tablet (20 mEq total) by mouth 2 (two) times a week., Disp: 26 tablet, Rfl: 0 .  propranolol (INDERAL) 20 MG tablet, Take 1 tablet (20 mg total) by mouth daily., Disp: 30 tablet, Rfl: 3 .  rizatriptan (MAXALT-MLT) 10 MG disintegrating tablet, Take 1 tablet (10 mg total) by mouth as needed for migraine. May repeat in 2 hours if needed, Disp: 9 tablet, Rfl: 11 .  topiramate (TOPAMAX) 50 MG tablet, Start with 50mg (1 tab) at bedtime. In 1 week increase to 100mg (2 tabs) at bedtime. (Patient taking differently: Take 100 mg by mouth at bedtime. ), Disp: 180 tablet, Rfl: 6 .  Vitamin D, Ergocalciferol, (DRISDOL) 1.25 MG (50000 UNIT) CAPS capsule, Take 1 capsule (50,000 Units total) by mouth every 7 (seven) days., Disp: 4 capsule, Rfl: 0 .  sertraline (ZOLOFT) 100 MG tablet, Take 200 mg by mouth daily., Disp: , Rfl:

## 2019-12-18 NOTE — Patient Instructions (Signed)
We will review your CT scans with the Thoracic medicine team and decide the best route to move forward with a biopsy, either via Endobronchial Ultra Sound or with interventional radiology. We will call you with the plan once reviewed with everyone.

## 2019-12-21 ENCOUNTER — Encounter: Payer: Self-pay | Admitting: Neurology

## 2019-12-21 ENCOUNTER — Ambulatory Visit (INDEPENDENT_AMBULATORY_CARE_PROVIDER_SITE_OTHER): Payer: BC Managed Care – PPO | Admitting: Neurology

## 2019-12-21 ENCOUNTER — Other Ambulatory Visit: Payer: Self-pay

## 2019-12-21 VITALS — BP 134/81 | HR 80 | Ht 67.0 in | Wt 364.0 lb

## 2019-12-21 DIAGNOSIS — E662 Morbid (severe) obesity with alveolar hypoventilation: Secondary | ICD-10-CM

## 2019-12-21 DIAGNOSIS — H4710 Unspecified papilledema: Secondary | ICD-10-CM

## 2019-12-21 DIAGNOSIS — R519 Headache, unspecified: Secondary | ICD-10-CM | POA: Diagnosis not present

## 2019-12-21 DIAGNOSIS — G2581 Restless legs syndrome: Secondary | ICD-10-CM | POA: Insufficient documentation

## 2019-12-21 DIAGNOSIS — G43709 Chronic migraine without aura, not intractable, without status migrainosus: Secondary | ICD-10-CM

## 2019-12-21 DIAGNOSIS — E669 Obesity, unspecified: Secondary | ICD-10-CM

## 2019-12-21 NOTE — Patient Instructions (Signed)
Insomnia Insomnia is a sleep disorder that makes it difficult to fall asleep or stay asleep. Insomnia can cause fatigue, low energy, difficulty concentrating, mood swings, and poor performance at work or school. There are three different ways to classify insomnia:  Difficulty falling asleep.  Difficulty staying asleep.  Waking up too early in the morning. Any type of insomnia can be long-term (chronic) or short-term (acute). Both are common. Short-term insomnia usually lasts for three months or less. Chronic insomnia occurs at least three times a week for longer than three months. What are the causes? Insomnia may be caused by another condition, situation, or substance, such as:  Anxiety.  Certain medicines.  Gastroesophageal reflux disease (GERD) or other gastrointestinal conditions.  Asthma or other breathing conditions.  Restless legs syndrome, sleep apnea, or other sleep disorders.  Chronic pain.  Menopause.  Stroke.  Abuse of alcohol, tobacco, or illegal drugs.  Mental health conditions, such as depression.  Caffeine.  Neurological disorders, such as Alzheimer's disease.  An overactive thyroid (hyperthyroidism). Sometimes, the cause of insomnia may not be known. What increases the risk? Risk factors for insomnia include:  Gender. Women are affected more often than men.  Age. Insomnia is more common as you get older.  Stress.  Lack of exercise.  Irregular work schedule or working night shifts.  Traveling between different time zones.  Certain medical and mental health conditions. What are the signs or symptoms? If you have insomnia, the main symptom is having trouble falling asleep or having trouble staying asleep. This may lead to other symptoms, such as:  Feeling fatigued or having low energy.  Feeling nervous about going to sleep.  Not feeling rested in the morning.  Having trouble concentrating.  Feeling irritable, anxious, or depressed. How  is this diagnosed? This condition may be diagnosed based on:  Your symptoms and medical history. Your health care provider may ask about: ? Your sleep habits. ? Any medical conditions you have. ? Your mental health.  A physical exam. How is this treated? Treatment for insomnia depends on the cause. Treatment may focus on treating an underlying condition that is causing insomnia. Treatment may also include:  Medicines to help you sleep.  Counseling or therapy.  Lifestyle adjustments to help you sleep better. Follow these instructions at home: Eating and drinking   Limit or avoid alcohol, caffeinated beverages, and cigarettes, especially close to bedtime. These can disrupt your sleep.  Do not eat a large meal or eat spicy foods right before bedtime. This can lead to digestive discomfort that can make it hard for you to sleep. Sleep habits   Keep a sleep diary to help you and your health care provider figure out what could be causing your insomnia. Write down: ? When you sleep. ? When you wake up during the night. ? How well you sleep. ? How rested you feel the next day. ? Any side effects of medicines you are taking. ? What you eat and drink.  Make your bedroom a dark, comfortable place where it is easy to fall asleep. ? Put up shades or blackout curtains to block light from outside. ? Use a white noise machine to block noise. ? Keep the temperature cool.  Limit screen use before bedtime. This includes: ? Watching TV. ? Using your smartphone, tablet, or computer.  Stick to a routine that includes going to bed and waking up at the same times every day and night. This can help you fall asleep faster. Consider   making a quiet activity, such as reading, part of your nighttime routine.  Try to avoid taking naps during the day so that you sleep better at night.  Get out of bed if you are still awake after 15 minutes of trying to sleep. Keep the lights down, but try reading or  doing a quiet activity. When you feel sleepy, go back to bed. General instructions  Take over-the-counter and prescription medicines only as told by your health care provider.  Exercise regularly, as told by your health care provider. Avoid exercise starting several hours before bedtime.  Use relaxation techniques to manage stress. Ask your health care provider to suggest some techniques that may work well for you. These may include: ? Breathing exercises. ? Routines to release muscle tension. ? Visualizing peaceful scenes.  Make sure that you drive carefully. Avoid driving if you feel very sleepy.  Keep all follow-up visits as told by your health care provider. This is important. Contact a health care provider if:  You are tired throughout the day.  You have trouble in your daily routine due to sleepiness.  You continue to have sleep problems, or your sleep problems get worse. Get help right away if:  You have serious thoughts about hurting yourself or someone else. If you ever feel like you may hurt yourself or others, or have thoughts about taking your own life, get help right away. You can go to your nearest emergency department or call:  Your local emergency services (911 in the [place]  A suicide crisis helpline, such as the National Suicide Prevention Lifeline at 1-800-273-8255. This is open 24 hours a day. Summary  Insomnia is a sleep disorder that makes it difficult to fall asleep or stay asleep.  Insomnia can be long-term (chronic) or short-term (acute).  Treatment for insomnia depends on the cause. Treatment may focus on treating an underlying condition that is causing insomnia.  Keep a sleep diary to help you and your health care provider figure out what could be causing your insomnia. This information is not intended to replace advice given to you by your health care provider. Make sure you discuss any questions you have with your health care provider. Document  Revised: 03/01/2017 Document Reviewed: 12/27/2016 Elsevier Patient Education  2020 Else[employer] Inc. Restless Legs Syndrome Restless legs syndrome is a condition that causes uncomfortable feelings or sensations in the legs, especially while sitting or lying down. The sensations usually cause an overwhelming urge to move the legs. The arms can also sometimes be affected. The condition can range from mild to severe. The symptoms often interfere with a person's ability to sleep. What are the causes? The cause of this condition is not known. What increases the risk? The following factors may make you more likely to develop this condition:  Being older than 50.  Pregnancy.  Being a woman. In general, the condition is more common in women than in men.  A family history of the condition.  Having iron deficiency.  Overuse of caffeine, nicotine, or alcohol.  Certain medical conditions, such as kidney disease, Parkinson's disease, or nerve damage.  Certain medicines, such as those for high blood pressure, nausea, colds, allergies, depression, and some heart conditions. What are the signs or symptoms? The main symptom of this condition is uncomfortable sensations in the legs, such as:  Pulling.  Tingling.  Prickling.  Throbbing.  Crawling.  Burning. Usually, the sensations:  Affect both sides of the body.  Are worse when you sit   or lie down.  Are worse at night. These may wake you up or make it difficult to fall asleep.  Make you have a strong urge to move your legs.  Are temporarily relieved by moving your legs. The arms can also be affected, but this is rare. People who have this condition often have tiredness during the day because of their lack of sleep at night. How is this diagnosed? This condition may be diagnosed based on:  Your symptoms.  Blood tests. In some cases, you may be monitored in a sleep lab by a specialist (a sleep study). This can detect any disruptions  in your sleep. How is this treated? This condition is treated by managing the symptoms. This may include:  Lifestyle changes, such as exercising, using relaxation techniques, and avoiding caffeine, alcohol, or tobacco.  Medicines. Anti-seizure medicines may be tried first. Follow these instructions at home:     General instructions  Take over-the-counter and prescription medicines only as told by your health care provider.  Use methods to help relieve the uncomfortable sensations, such as: ? Massaging your legs. ? Walking or stretching. ? Taking a cold or hot bath.  Keep all follow-up visits as told by your health care provider. This is important. Lifestyle  Practice good sleep habits. For example, go to bed and get up at the same time every day. Most adults should get 7-9 hours of sleep each night.  Exercise regularly. Try to get at least 30 minutes of exercise most days of the week.  Practice ways of relaxing, such as yoga or meditation.  Avoid caffeine and alcohol.  Do not use any products that contain nicotine or tobacco, such as cigarettes and e-cigarettes. If you need help quitting, ask your health care provider. Contact a health care provider if:  Your symptoms get worse or they do not improve with treatment. Summary  Restless legs syndrome is a condition that causes uncomfortable feelings or sensations in the legs, especially while sitting or lying down.  The symptoms often interfere with a person's ability to sleep.  This condition is treated by managing the symptoms. You may need to make lifestyle changes or take medicines. This information is not intended to replace advice given to you by your health care provider. Make sure you discuss any questions you have with your health care provider. Document Revised: 04/08/2017 Document Reviewed: 04/08/2017 Elsevier Patient Education  2020 Elsevier Inc.  

## 2019-12-21 NOTE — Progress Notes (Signed)
SLEEP MEDICINE CLINIC    Provider:  Melvyn Novas, MD  Primary Care Physician:  [name], NP [id number] [place] [address]     Referring Provider: [name], Np [address] [place],  [place] [address]          Chief Complaint according to patient   Patient presents with:    . New Patient (Initial Visit)     [name] is a 29 year old patient of the [facility], where she is followed by Dr. Orlene Plum, MD. She has seen Dr. Naomie Dean here at [facility] in an attempt to help her control her chronic migraines. She had been on a lot of preventive medications that also have not shown to be extremely effective. There are also more than 1 headache type described some more migrainous in some more bandlike tension or stabbing and sharp headaches.. She has managed over-the-counter with Excedrin and aspirin. She has also developed pulsating pounding throbbing headaches associated with nausea and photo and phonophobia but not vomiting. She does have GERD which has also affected her sleep in the past. She endorsed a past medical history of hypothyroidism, iron deficiency anemia in the context of heavy menstrual bleeding, pneumonia, GERD and arthritis of the left ankle. She had arthroscopy for the right knee, a tonsillectomy and wisdom tooth extraction. She has been told she has been treated for migraine depression and anxiety. She reports decreased energy the feeling that she never gets enough sleep but also that she has racing thoughts keeping her from entering sleep or feeling refreshed and restored in the morning. She reports shortness of breath and waking up with headaches.      HISTORY OF PRESENT ILLNESS:  Margaret Golden is a 30 y.o. year old White or Caucasian female patient seen on 12/21/2019.  Chief concern according to patient :  see above.    I have the pleasure of seeing Margaret Golden today, a right -handed White or Caucasian female patient of dr Trevor Mace MD. She has a possible sleep disorder.  She  has a past medical history of Acid reflux, ADD (attention deficit disorder), Anemia, Anxiety, Arthritis, Back pain, Chest pain, Depression, Dyspnea, Exercise-induced asthma, Fatty liver, History of blood transfusion, History of chicken pox, History of kidney stones, Hypothyroidism, Joint pain, Lower extremity edema, Major depressive disorder, Migraines, Pneumonia, RLS (restless legs syndrome), and Thyroid disease.  Bronchitis - not COVID related. Fully vaccinated.     The patient had the first sleep study in 01-2016  with a result of an AHI ( Apnea Hypopnea index)  of 3.5/h. , there was no N3 sleep and no REM sleep captured and she had a very low sleep efficiency.    Family medical /sleep history: father with OSA, nobody with insomnia, no sleep walkers.    Social history:  Patient is currently unemployed and lives in a household with her 2 cats alone, her father moved out earlier this year- . Family status is single and had always lived with her parents- mother died in 2007/08/13.Tobacco use- none .  ETOH use; seldomly ,  Caffeine intake in form of Soda( cola- 4-6 /d now 2 /d ). Migraines get worse when she doesn't have caffeine intake. Regular exercise - none, too SOB, too fatigued.       Sleep habits are as follows: The patient's dinner time is between 6-7 PM. The patient goes  to bed at 11 PM and continues to struggle to sleep , once asleep she will stay often for several hours, she wakes  From GERD, Headaches- not from bathroom breaks.   The preferred sleep position is laterally- she wakes up in supine-, with the support of 1 pillow- head of bed adjusted. Dreams are reportedly infrequent but vivid.  8 AM is the usual rise time. The patient wakes up at 8 with an alarm.  She reports not feeling refreshed or restored in AM, with symptoms such as dry mouth often with , morning  headaches, and always with  residual fatigue.  Naps are taken infrequently, trying to avoid these- after lunch - lasting from 1-2 hours and just a bit more refreshing than nocturnal sleep.    Review of Systems: Out of a complete 14 system review, the patient complains of only the following symptoms, and all other reviewed systems are negative.:  Fatigue, sleepiness , snoring, fragmented sleep, Insomnia- chronic . Insomnia proceeded her mother's death but worsened after that in 2009. She gained weight - she became depressed, ate to comfort her. She began having sleep problems at 29 years of age. She carried a depression diagnosis since age 29- major depression, with suicidal ideas. . No history of sexual or physical abuse.  Feeling sore, sometimes achy. She wakes often in the same position she went to sleep in. Obesity related inflammatory disease.     How likely are you to doze in the following situations: 0 = not likely, 1 = slight chance, 2 = moderate chance, 3 = high chance   Sitting and Reading? Watching Television? Sitting inactive in a public place (theater or meeting)? As a passenger in a car for an hour without a break? Lying down in the afternoon when circumstances permit? Sitting and talking to someone? Sitting quietly after lunch without alcohol? In a car, while stopped for a few minutes in traffic?   Total = 6/ 24 points   FSS endorsed at 57/ 63 points. This is very high -   Social History   Socioeconomic History  . Marital status: Single    Spouse name: Not on file  . Number of children: 0  . Years of education: 3512  . Highest education level: Not on file  Occupational History  . Occupation: Conservation officer, historic buildingsullfillment employee  Tobacco Use  . Smoking status: Never Smoker  . Smokeless tobacco: Never Used  Vaping Use  . Vaping Use: Never used  Substance and Sexual Activity  . Alcohol use: Not Currently    Alcohol/week: 0.0 standard drinks  . Drug use: No  . Sexual activity:  Never    Comment: never sexually active  Other Topics Concern  . Not on file  Social History Narrative   Unemployed   Lives alone --update 11/11/19   Seeking work   Only child   Dog 3   Lives in a one story house with father, has a cat-12/26/16-sjb      Caffeine: 2-4 cups/day   Social Determinants of Health   Financial Resource Strain:   . Difficulty of Paying Living Expenses: Not on file  Food Insecurity:   . Worried About Programme researcher, broadcasting/film/videounning Out of Food in the Last Year: Not on file  . Ran Out of Food in the Last Year: Not on file  Transportation Needs:   . Lack of Transportation (Medical): Not on file  . Lack of Transportation (Non-Medical): Not on file  Physical Activity:   . Days of Exercise per  Week: Not on file  . Minutes of Exercise per Session: Not on file  Stress:   . Feeling of Stress : Not on file  Social Connections:   . Frequency of Communication with Friends and Family: Not on file  . Frequency of Social Gatherings with Friends and Family: Not on file  . Attends Religious Services: Not on file  . Active Member of Clubs or Organizations: Not on file  . Attends Banker Meetings: Not on file  . Marital Status: Not on file    Family History  Problem Relation Age of Onset  . Depression Mother   . Obesity Mother   . Depression Father   . Sleep apnea Father   . Obesity Father   . Cancer Paternal Aunt        colon  . Cancer Paternal Uncle        brain cancer  . Diabetes Maternal Grandfather   . Arthritis Maternal Grandfather   . Arthritis Maternal Grandmother   . Breast cancer Maternal Grandmother   . Arthritis Paternal Grandmother   . Arthritis Paternal Grandfather   . Migraines Neg Hx     Past Medical History:  Diagnosis Date  . Acid reflux   . ADD (attention deficit disorder)   . Anemia   . Anxiety   . Arthritis    left ankle  . Back pain   . Chest pain   . Depression   . Dyspnea   . Exercise-induced asthma   . Fatty liver   . History  of blood transfusion   . History of chicken pox   . History of kidney stones   . Hypothyroidism   . Joint pain   . Lower extremity edema   . Major depressive disorder   . Migraines    chronic  . Pneumonia   . RLS (restless legs syndrome)   . Thyroid disease    hypothyroid    Past Surgical History:  Procedure Laterality Date  . KNEE ARTHROSCOPY Right 2007  . TONSILLECTOMY  2010  . WISDOM TOOTH EXTRACTION       Current Outpatient Medications on File Prior to Visit  Medication Sig Dispense Refill  . albuterol (VENTOLIN HFA) 108 (90 Base) MCG/ACT inhaler Inhale 2 puffs into the lungs every 6 (six) hours as needed for wheezing or shortness of breath (Chest tightness). 18 g 0  . furosemide (LASIX) 20 MG tablet Take 1 tablet (20 mg total) by mouth 2 (two) times a week. 26 tablet 0  . Iron-FA-B Cmp-C-Biot-Probiotic (FUSION PLUS) CAPS One capsule twice daily with orange juice. Stop your Ferrous sulfate. 30 capsule 1  . levothyroxine (SYNTHROID) 100 MCG tablet Take 100 mcg by mouth daily before breakfast.    . levothyroxine (SYNTHROID) 112 MCG tablet Take 1 tablet (112 mcg total) by mouth daily. 90 tablet 0  . LORazepam (ATIVAN) 0.5 MG tablet Take 1 tablet (0.5 mg total) by mouth every 8 (eight) hours. 30 tablet 1  . Multiple Vitamins-Minerals (MULTIVITAMIN WITH MINERALS) tablet Take 1 tablet by mouth daily.    Marland Kitchen omeprazole (PRILOSEC) 20 MG capsule Take 2 capsules (40 mg total) by mouth daily. 180 capsule 2  . potassium chloride SA (KLOR-CON) 20 MEQ tablet Take 1 tablet (20 mEq total) by mouth 2 (two) times a week. 26 tablet 0  . propranolol (INDERAL) 20 MG tablet Take 1 tablet (20 mg total) by mouth daily. 30 tablet 3  . rizatriptan (MAXALT-MLT) 10 MG disintegrating tablet Take  1 tablet (10 mg total) by mouth as needed for migraine. May repeat in 2 hours if needed 9 tablet 11  . sertraline (ZOLOFT) 100 MG tablet Take 200 mg by mouth daily.    Marland Kitchen topiramate (TOPAMAX) 50 MG tablet Start with  50mg (1 tab) at bedtime. In 1 week increase to 100mg (2 tabs) at bedtime. (Patient taking differently: Take 100 mg by mouth at bedtime. ) 180 tablet 6  . Vitamin D, Ergocalciferol, (DRISDOL) 1.25 MG (50000 UNIT) CAPS capsule Take 1 capsule (50,000 Units total) by mouth every 7 (seven) days. 4 capsule 0   No current facility-administered medications on file prior to visit.    Allergies  Allergen Reactions  . Augmentin [Amoxicillin-Pot Clavulanate] Hives    Physical exam:  Today's Vitals   12/21/19 1257  BP: 134/81  Pulse: 80  Weight: (!) 364 lb (165.1 kg)  Height: 5\' 7"  (1.702 m)   Body mass index is 57.01 kg/m.   Wt Readings from Last 3 Encounters:  12/21/19 (!) 364 lb (165.1 kg)  12/18/19 (!) 367 lb 3.2 oz (166.6 kg)  12/14/19 (!) 362 lb (164.2 kg)     Ht Readings from Last 3 Encounters:  12/21/19 5\' 7"  (1.702 m)  12/18/19 5\' 7"  (1.702 m)  12/14/19 5\' 7"  (1.702 m)      General: The patient is awake, alert and appears not in acute distress. The patient is well groomed. Head: Normocephalic, atraumatic. Neck is supple. Mallampati 3 plus. ,  neck circumference:19 inches . Nasal airflow is patent.  Retrognathia is not seen.  Dental status: intact, crowded. Cardiovascular:  Regular rate and cardiac rhythm by pulse,  without distended neck veins. Respiratory: Lungs are clear to auscultation.  Skin:  Without evidence of ankle edema, or rash. Trunk: The patient's posture is stooped.   Neurologic exam : The patient is awake and alert, oriented to place and time.   Memory subjective described as intact.  Attention span & concentration ability appears normal.  Speech is fluent,  without  dysarthria, dysphonia or aphasia.  Mood and affect are appropriate.   Cranial nerves: no loss of smell or taste reported  Pupils are equal and briskly reactive to light. Funduscopic exam deferred..  Extraocular movements in vertical and horizontal planes were intact and without nystagmus. No  Diplopia. Visual fields by finger perimetry are intact. Hearing was intact to soft voice and finger rubbing.   Facial sensation intact to fine touch. Facial motor strength is symmetric and tongue and uvula move midline.  Neck ROM : rotation, tilt and flexion extension were normal for age and shoulder shrug was symmetrical.    Motor exam:  Symmetric bulk, tone and ROM.   Normal tone without cog wheeling, symmetric grip strength .   Sensory: Fine touch, pinprick and vibration were  normal. She reports numbness on the side of the right hand.  Proprioception tested in the upper extremities was normal.   Coordination: Rapid alternating movements in the fingers/hands were of normal speed.  The Finger-to-nose maneuver was intact without evidence of ataxia, dysmetria or tremor.   Gait and station: Patient could rise unassisted from a seated position, walked without assistive device.  Stance is of wider base - the patient turned with 4 steps.  Toe and heel walk were deferred.  Deep tendon reflexes: in the  upper and lower extremities are symmetric and intact.  Babinski response was deferred .      After spending a total time of 45 minutes face to  face and additional time for physical and neurologic examination, review of laboratory studies,  personal review of imaging studies, reports and results of other testing and review of referral information / records as far as provided in visit, I have established the following assessments:  1)  Also there ar many risk factors for OSA and obesity hypoventilation, plus high grade upper airway restriction and neck size. BMI is super obese. Hypoxemia risk is high, waking her up with palpitations.  2) Insomnia is chronic and related to mood disorder.  3) chronic migraine, tension headaches and rare cluster headaches- all are present.  4) delayed sleep onset due to re acing thoughts but also due to RLS.     My Plan is to proceed with:  1) I would much  prefer an attended sleep study as she has RLS, currently treated with gabapentin.  2) The patient has many risk factors for hypoventilation- OSA and hypoxemia.  3) I need to see REM sleep related apnea and oxygenation to make a correct diagnosis.   I thank Dr. Dalbert Garnet and Lucia Gaskins, MDs,  for allowing me to meet with and to take care of this pleasant patient.   In short, Margaret Golden is presenting with chronic Insomnia and chronic mixed headaches , chronic depression and poor sleep efficiency.   I plan to follow up either personally or through our NP within 3-4 month.   .  Electronically signed by: Melvyn Novas, MD 12/21/2019 1:11 PM  [facility] Board certified by The [employer] of Sleep Medicine and Diplomate of the [employer] of Sleep Medicine. Board certified In Neurology through the ABPN, Fellow of the [employer] of Neu[employer]. Medical Director of [facility].

## 2019-12-22 ENCOUNTER — Telehealth: Payer: Self-pay

## 2019-12-22 NOTE — Telephone Encounter (Signed)
LVM for pt to call me back to schedule sleep study  

## 2019-12-23 ENCOUNTER — Institutional Professional Consult (permissible substitution): Payer: BC Managed Care – PPO | Admitting: Pulmonary Disease

## 2019-12-23 DIAGNOSIS — R918 Other nonspecific abnormal finding of lung field: Secondary | ICD-10-CM | POA: Insufficient documentation

## 2019-12-23 DIAGNOSIS — R590 Localized enlarged lymph nodes: Secondary | ICD-10-CM | POA: Insufficient documentation

## 2019-12-25 DIAGNOSIS — F4323 Adjustment disorder with mixed anxiety and depressed mood: Secondary | ICD-10-CM | POA: Diagnosis not present

## 2019-12-28 ENCOUNTER — Telehealth (INDEPENDENT_AMBULATORY_CARE_PROVIDER_SITE_OTHER): Payer: BC Managed Care – PPO | Admitting: Physician Assistant

## 2019-12-28 ENCOUNTER — Telehealth (INDEPENDENT_AMBULATORY_CARE_PROVIDER_SITE_OTHER): Payer: Self-pay

## 2019-12-28 ENCOUNTER — Encounter (INDEPENDENT_AMBULATORY_CARE_PROVIDER_SITE_OTHER): Payer: Self-pay | Admitting: Physician Assistant

## 2019-12-28 ENCOUNTER — Other Ambulatory Visit: Payer: Self-pay

## 2019-12-28 DIAGNOSIS — Z6841 Body Mass Index (BMI) 40.0 and over, adult: Secondary | ICD-10-CM | POA: Diagnosis not present

## 2019-12-28 DIAGNOSIS — E8881 Metabolic syndrome: Secondary | ICD-10-CM

## 2019-12-28 DIAGNOSIS — E559 Vitamin D deficiency, unspecified: Secondary | ICD-10-CM | POA: Diagnosis not present

## 2019-12-28 MED ORDER — VITAMIN D (ERGOCALCIFEROL) 1.25 MG (50000 UNIT) PO CAPS: 50000.0000 [IU] | ORAL_CAPSULE | ORAL | 0 refills | Status: DC

## 2019-12-28 NOTE — Telephone Encounter (Signed)
Consent obtained to conduct visit via telehealth/video visit.  Margaret Cieslewicz LPN

## 2019-12-28 NOTE — Progress Notes (Signed)
TeleHealth Visit:  Due to the COVID-19 pandemic, this visit was completed with telemedicine (audio/video) technology to reduce patient and provider exposure as well as to preserve personal protective equipment.   Timira has verbally consented to this TeleHealth visit. The patient is located at home, the provider is located at the [facility] office. The participants in this visit include the listed provider and patient. The visit was conducted today via MyChart video.   Chief Complaint: OBESITY Dona is here to discuss her progress with her obesity treatment plan along with follow-up of her obesity related diagnoses. Carmelite is on the Category 3 Plan and states she is following her eating plan approximately 20% of the time. Gunda states she is doing 0 minutes 0 times per week.  Today's visit was #: 8 Starting weight: 367 lbs Starting date: 09/03/2019  Interim History: Elisha states that her weight at home is 361 lbs. Her appetite has decreased since she has been stressed with health issues. She is skipping breakfast sometimes.  Subjective:   1. Insulin resistance Roxene is not on medications, and she notes her appetite has decreased due to stress.  2. Vitamin D deficiency Shaterica is on Vit D, and she is tolerating it well.  Assessment/Plan:   1. Insulin resistance Mylene will continue to work on weight loss, exercise, and decreasing simple carbohydrates to help decrease the risk of diabetes. Maddalynn agreed to follow-up with [place] as directed to closely monitor her progress.  2. Vitamin D deficiency Low Vitamin D level contributes to fatigue and are associated with obesity, breast, and colon cancer. We will refill prescription Vitamin D for 1 month. Jesslyn will follow-up for routine testing of Vitamin D, at least 2-3 times per year to avoid over-replacement.  - Vitamin D, Ergocalciferol, (DRISDOL) 1.25 MG (50000 UNIT) CAPS capsule; Take 1 capsule (50,000 Units total) by  mouth every 7 (seven) days.  Dispense: 4 capsule; Refill: 0  3. Class 3 severe obesity with serious comorbidity and body mass index (BMI) of 50.0 to 59.9 in adult, unspecified obesity type (HCC) Janelis is currently in the action stage of change. As such, her goal is to continue with weight loss efforts. She has agreed to the Category 4 Plan.   Exercise goals: No exercise has been prescribed at this time.  Behavioral modification strategies: meal planning and cooking strategies and keeping healthy foods in the home.  Stacyann has agreed to follow-up with our clinic in 2 weeks. She was informed of the importance of frequent follow-up visits to maximize her success with intensive lifestyle modifications for her multiple health conditions.  Objective:   VITALS: Per patient if applicable, see vitals. GENERAL: Alert and in no acute distress. CARDIOPULMONARY: No increased WOB. Speaking in clear sentences.  PSYCH: Pleasant and cooperative. Speech normal rate and rhythm. Affect is appropriate. Insight and judgement are appropriate. Attention is focused, linear, and appropriate.  NEURO: Oriented as arrived to appointment on time with no prompting.   Lab Results  Component Value Date   CREATININE 0.74 09/30/2019   BUN 8 09/30/2019   NA 139 09/30/2019   K 3.9 09/30/2019   CL 107 09/30/2019   CO2 25 09/30/2019   Lab Results  Component Value Date   ALT 14 09/30/2019   AST 27 09/30/2019   ALKPHOS 69 09/30/2019   BILITOT 0.4 09/30/2019   Lab Results  Component Value Date   HGBA1C 4.9 09/03/2019   HGBA1C 5.0 07/30/2019   HGBA1C 4.6 07/06/2016  Lab Results  Component Value Date   INSULIN 29.2 (H) 09/03/2019   Lab Results  Component Value Date   TSH 0.66 11/20/2019   Lab Results  Component Value Date   CHOL 150 09/03/2019   HDL 39 (L) 09/03/2019   LDLCALC 89 09/03/2019   TRIG 120 09/03/2019   CHOLHDL 4 07/21/2019   Lab Results  Component Value Date   WBC 7.7 11/25/2019   HGB  12.5 11/25/2019   HCT 40.3 11/25/2019   MCV 80.4 11/25/2019   PLT 218 11/25/2019   Lab Results  Component Value Date   IRON 52 11/25/2019   TIBC 351 11/25/2019   FERRITIN 86 11/25/2019    Attestation Statements:   Reviewed by clinician on day of visit: allergies, medications, problem list, medical history, surgical history, family history, social history, and previous encounter notes.   Trude Mcburney, am acting as transcriptionist for [employer], PA-C.  I have reviewed the above documentation for accuracy and completeness, and I agree with the above. Alois Cliche, PA-C

## 2019-12-29 DIAGNOSIS — F4323 Adjustment disorder with mixed anxiety and depressed mood: Secondary | ICD-10-CM | POA: Diagnosis not present

## 2019-12-30 NOTE — Progress Notes (Signed)
[facility] [address], [address] [place], [place] [address] [phone]  Date:  12/31/2019   Name:  CALEIGH RABELO   DOB:  14-Jun-1990   MRN:  [id number]  PCP:  [name], NP    Chief Complaint: No chief complaint on file.   History of Present Illness:  Margaret Golden is a 29 y.o. very pleasant female patient who presents with the following:  Virtual visit today for patient with concern of acute illness History of hypertension, migraine, obesity with alveolar hypoventilation, hyperlipidemia  Patient location is her car, provider location is office.  Patient identity confirmed with 2 factors, she gives consent for virtual visit today.  The patient and myself are present on the call today  She has completed her COVID-19 series, second vaccination given April 19  Over this past weekend pt noted sx of a cold- congestion, runny nose, cough, ST She has lost her voice for a couple of days  She then started to notice a smell of smoke in the air -however there was nothing burning.  This struck her as unusual, and made her worry about COVID-19 Today, this unusual smell sense actually seems better  She is not running a fever She has noted some pain in her ears and sinus pressure  Pt states no chance of current pregnancy   Patient Active Problem List   Diagnosis Date Noted  . Mediastinal lymphadenopathy 12/23/2019  . Pulmonary nodules 12/23/2019  . RLS (restless legs syndrome) 12/21/2019  . Super obesity 12/21/2019  . Papilledema 12/21/2019  . Obesity with alveolar hypoventilation and body mass index (BMI) of 40 or greater (HCC) 12/21/2019  . Morning headache 12/21/2019  . NSVT (nonsustained ventricular tachycardia) (HCC) 12/09/2019  . Mixed hyperlipidemia 12/09/2019  . Palpitations 12/09/2019  . IDA (iron deficiency anemia) 10/20/2019  . Hematometra 07/15/2019  . Menorrhagia with regular cycle 07/15/2019  .  Symptomatic anemia 04/07/2019  . Social phobia 02/26/2018  . Major depressive disorder, recurrent episode, mild (HCC) 02/26/2018  . Insomnia 02/26/2018  . Left ankle pain 02/28/2017  . Class 3 severe obesity due to excess calories with serious comorbidity in adult (HCC) 05/11/2016  . Fatty liver disease, nonalcoholic 02/02/2015  . Depression 01/03/2015  . Hypothyroidism 01/03/2015  . GERD (gastroesophageal reflux disease) 01/03/2015  . Migraines 01/03/2015  . Essential hypertension 06/02/2014    Past Medical History:  Diagnosis Date  . Acid reflux   . ADD (attention deficit disorder)   . Anemia   . Anxiety   . Arthritis    left ankle  . Back pain   . Chest pain   . Depression   . Dyspnea   . Exercise-induced asthma   . Fatty liver   . History of blood transfusion   . History of chicken pox   . History of kidney stones   . Hypothyroidism   . Joint pain   . Lower extremity edema   . Major depressive disorder   . Migraines    chronic  . Pneumonia   . RLS (restless legs syndrome)   . Thyroid disease    hypothyroid    Past Surgical History:  Procedure Laterality Date  . KNEE ARTHROSCOPY Right 2007  . TONSILLECTOMY  2010  . WISDOM TOOTH EXTRACTION      Social History   Tobacco Use  . Smoking status: Never Smoker  . Smokeless tobacco: Never Used  Vaping Use  . Vaping  Use: Never used  Substance Use Topics  . Alcohol use: Not Currently    Alcohol/week: 0.0 standard drinks  . Drug use: No    Family History  Problem Relation Age of Onset  . Depression Mother   . Obesity Mother   . Depression Father   . Sleep apnea Father   . Obesity Father   . Cancer Paternal Aunt        colon  . Cancer Paternal Uncle        brain cancer  . Diabetes Maternal Grandfather   . Arthritis Maternal Grandfather   . Arthritis Maternal Grandmother   . Breast cancer Maternal Grandmother   . Arthritis Paternal Grandmother   . Arthritis Paternal Grandfather   . Migraines Neg  Hx     Allergies  Allergen Reactions  . Augmentin [Amoxicillin-Pot Clavulanate] Hives    Medication list has been reviewed and updated.  Current Outpatient Medications on File Prior to Visit  Medication Sig Dispense Refill  . albuterol (VENTOLIN HFA) 108 (90 Base) MCG/ACT inhaler Inhale 2 puffs into the lungs every 6 (six) hours as needed for wheezing or shortness of breath (Chest tightness). 18 g 0  . furosemide (LASIX) 20 MG tablet Take 1 tablet (20 mg total) by mouth 2 (two) times a week. 26 tablet 0  . Iron-FA-B Cmp-C-Biot-Probiotic (FUSION PLUS) CAPS One capsule twice daily with orange juice. Stop your Ferrous sulfate. 30 capsule 1  . levothyroxine (SYNTHROID) 100 MCG tablet Take 100 mcg by mouth daily before breakfast.    . levothyroxine (SYNTHROID) 112 MCG tablet Take 1 tablet (112 mcg total) by mouth daily. 90 tablet 0  . LORazepam (ATIVAN) 0.5 MG tablet Take 1 tablet (0.5 mg total) by mouth every 8 (eight) hours. 30 tablet 1  . Multiple Vitamins-Minerals (MULTIVITAMIN WITH MINERALS) tablet Take 1 tablet by mouth daily.    Marland Kitchen omeprazole (PRILOSEC) 20 MG capsule Take 2 capsules (40 mg total) by mouth daily. 180 capsule 2  . potassium chloride SA (KLOR-CON) 20 MEQ tablet Take 1 tablet (20 mEq total) by mouth 2 (two) times a week. 26 tablet 0  . propranolol (INDERAL) 20 MG tablet Take 1 tablet (20 mg total) by mouth daily. 30 tablet 3  . rizatriptan (MAXALT-MLT) 10 MG disintegrating tablet Take 1 tablet (10 mg total) by mouth as needed for migraine. May repeat in 2 hours if needed 9 tablet 11  . sertraline (ZOLOFT) 100 MG tablet Take 200 mg by mouth daily.    Marland Kitchen topiramate (TOPAMAX) 50 MG tablet Start with 50mg (1 tab) at bedtime. In 1 week increase to 100mg (2 tabs) at bedtime. (Patient taking differently: Take 100 mg by mouth at bedtime. ) 180 tablet 6  . Vitamin D, Ergocalciferol, (DRISDOL) 1.25 MG (50000 UNIT) CAPS capsule Take 1 capsule (50,000 Units total) by mouth every 7 (seven)  days. 4 capsule 0   No current facility-administered medications on file prior to visit.    Review of Systems:  As per HPI- otherwise negative.   Physical Examination: Vitals:   12/31/19 1546  BP: 126/68  Pulse: (!) 107  Resp: 18  Temp: 98.1 F (36.7 C)  SpO2: 97%   There were no vitals filed for this visit. There is no height or weight on file to calculate BMI. Ideal Body Weight:    Connected with pt via telephone -we were unable to get video to work No distress or shortness of breath noted during telephone conversation, she sounds well  Assessment and  Plan: Cough - Plan: Novel Coronavirus, NAA ([facility])  Patient here today for virtual visit with concern of cough and change in her smell sense; her symptoms are not severe, but she wishes to be screened for COVID-19 We will test her for COVID-19 today, will be in touch with her soon as possible She will let us know if any other changes or concerns in the meantime Signed Abbe Amsterdam, MD

## 2019-12-31 ENCOUNTER — Telehealth (INDEPENDENT_AMBULATORY_CARE_PROVIDER_SITE_OTHER): Payer: BC Managed Care – PPO | Admitting: Family Medicine

## 2019-12-31 ENCOUNTER — Other Ambulatory Visit: Payer: Self-pay

## 2019-12-31 ENCOUNTER — Encounter: Payer: Self-pay | Admitting: Family Medicine

## 2019-12-31 VITALS — BP 126/68 | HR 107 | Temp 98.1°F | Resp 18

## 2019-12-31 DIAGNOSIS — R05 Cough: Secondary | ICD-10-CM

## 2019-12-31 MED ORDER — DOXYCYCLINE HYCLATE 100 MG PO TABS: 100.0000 mg | ORAL_TABLET | Freq: Two times a day (BID) | ORAL | 0 refills | Status: DC

## 2020-01-01 ENCOUNTER — Ambulatory Visit: Payer: BC Managed Care – PPO | Admitting: Family

## 2020-01-01 LAB — NOVEL CORONAVIRUS, NAA: SARS-CoV-2, NAA: NOT DETECTED

## 2020-01-01 LAB — SARS-COV-2, NAA 2 DAY TAT

## 2020-01-05 ENCOUNTER — Ambulatory Visit (INDEPENDENT_AMBULATORY_CARE_PROVIDER_SITE_OTHER): Payer: BC Managed Care – PPO | Admitting: Psychiatry

## 2020-01-05 ENCOUNTER — Other Ambulatory Visit: Payer: Self-pay

## 2020-01-05 ENCOUNTER — Encounter: Payer: Self-pay | Admitting: Psychiatry

## 2020-01-05 DIAGNOSIS — F4323 Adjustment disorder with mixed anxiety and depressed mood: Secondary | ICD-10-CM | POA: Diagnosis not present

## 2020-01-05 DIAGNOSIS — F331 Major depressive disorder, recurrent, moderate: Secondary | ICD-10-CM | POA: Diagnosis not present

## 2020-01-05 DIAGNOSIS — G4700 Insomnia, unspecified: Secondary | ICD-10-CM | POA: Diagnosis not present

## 2020-01-05 DIAGNOSIS — F4010 Social phobia, unspecified: Secondary | ICD-10-CM

## 2020-01-05 MED ORDER — LAMOTRIGINE 25 MG PO TABS: ORAL_TABLET | ORAL | 0 refills | Status: DC

## 2020-01-05 MED ORDER — TRAZODONE HCL 50 MG PO TABS: ORAL_TABLET | ORAL | 1 refills | Status: DC

## 2020-01-05 NOTE — Progress Notes (Signed)
Margaret Famaylor A Pike [id number] 05/30/1990 28 y.o.  Subjective:   Patient ID:  Margaret Golden is a 29 y.o. (DOB 10/30/1990) female.  Chief Complaint:  Chief Complaint  Patient presents with   Depression   Anxiety    HPI Margaret Famaylor A Tomei presents to the office today for follow-up of depression and anxiety. She reports that her mood has been "up and down." Had sadness last weekend when she realized it was her deceased mother's birthday. She reports that her anxiety hs not been as bad. She reports that her anxiety improved with learning father is going to come visit soon. She has had occasional panic s/s "but not as bad... tightness in my chest." She reports that palpitations have been less and no longer seem to be anxiety related and now occur mostly just with exertion. She notices some anticipatory anxiety with medical apts. Denies irritability. She reports that sleep has been a struggle. Has been having difficulty with sleep initiation. Has sleep study scheduled for next week. Energy has been decreased with limited sleep. Has had some motivation and has been limited physically. Interest in things is slowly improving. Sometimes does not want to do tasks that she initiates. She reports that concentration is "come and go" and occasionally will zone out and other times can focus well. Not sure if she is dissociating and is losing track of time. She reports that she had lost her appetite and this is improving. Has been trying to follow meal plans. Denies SI.   She reports that her medical issues are continuing to be evaluated. Reports that she has a good support network.   Past medication trials: Sertraline-helpful for depression and anxiety Wellbutrin Lexapro-ineffective Cymbalta Pristiq Seroquel- does not recall any significant improvement Latuda Abilify Propranolol Melatonin Vyvanse Adderall XR- not effective Concerta- Notices duration only lasts about 4 hours. No significant improvement  with doses less than 54 mg. Horizant Lamictal-effective for mood Trazodone-effective for insomnia but causes some excessive drowsiness Rexulti- May have caused n/v and poor concentration Adhansia- May have caused n/v and poor concentration Ativan- helpful    PHQ2-9     [facility] Visit from [facility]03[facility]2019 [facility] Nutrition from 02/10/2019 [facility] [place] [facility] [place] [facility] from 02/20/2016 in [facility]  PHQ-2 Total Score 3 2 4 3 4   PHQ-9 Total Score 14 -- 15 16 --       Review of Systems:  Review of Systems  HENT: Positive for congestion and rhinorrhea.   Respiratory: Positive for shortness of breath.   Musculoskeletal: Negative for gait problem.  Neurological: Negative for tremors.  Psychiatric/Behavioral:       Please refer to HPI    Medications: I have reviewed the patient's current medications.  Current Outpatient Medications  Medication Sig Dispense Refill   albuterol (VENTOLIN HFA) 108 (90 Base) MCG/ACT inhaler Inhale 2 puffs into the lungs every 6 (six) hours as needed for wheezing or shortness of breath (Chest tightness). 18 g 0   doxycycline (VIBRA-TABS) 100 MG tablet Take 1 tablet (100 mg total) by mouth 2 (two) times daily. 20 tablet 0   furosemide (LASIX) 20 MG tablet Take 1 tablet (20 mg total) by mouth 2 (two) times a week. 26 tablet 0   Iron-FA-B Cmp-C-Biot-Probiotic (FUSION PLUS) CAPS One capsule twice daily with orange juice.  Stop your Ferrous sulfate. 30 capsule 1   levothyroxine (SYNTHROID) 100 MCG tablet Take 100 mcg by mouth daily before breakfast.     levothyroxine (SYNTHROID) 112 MCG tablet Take 1 tablet (112 mcg total) by mouth daily. 90 tablet 0   LORazepam (ATIVAN) 0.5 MG tablet Take 1 tablet (0.5 mg total) by  mouth every 8 (eight) hours. 30 tablet 1   Multiple Vitamins-Minerals (MULTIVITAMIN WITH MINERALS) tablet Take 1 tablet by mouth daily.     omeprazole (PRILOSEC) 20 MG capsule Take 2 capsules (40 mg total) by mouth daily. 180 capsule 2   potassium chloride SA (KLOR-CON) 20 MEQ tablet Take 1 tablet (20 mEq total) by mouth 2 (two) times a week. 26 tablet 0   propranolol (INDERAL) 20 MG tablet Take 1 tablet (20 mg total) by mouth daily. 30 tablet 3   rizatriptan (MAXALT-MLT) 10 MG disintegrating tablet Take 1 tablet (10 mg total) by mouth as needed for migraine. May repeat in 2 hours if needed 9 tablet 11   sertraline (ZOLOFT) 100 MG tablet Take 200 mg by mouth daily.     topiramate (TOPAMAX) 50 MG tablet Start with 50mg (1 tab) at bedtime. In 1 week increase to 100mg (2 tabs) at bedtime. (Patient taking differently: Take 100 mg by mouth at bedtime. ) 180 tablet 6   Vitamin D, Ergocalciferol, (DRISDOL) 1.25 MG (50000 UNIT) CAPS capsule Take 1 capsule (50,000 Units total) by mouth every 7 (seven) days. 4 capsule 0   lamoTRIgine (LAMICTAL) 25 MG tablet Take 1 tablet (25 mg total) by mouth daily for 14 days, THEN 2 tablets (50 mg total) daily for 14 days. 45 tablet 0   traZODone (DESYREL) 50 MG tablet Take 1/2-2 po QHS prn insomnia 60 tablet 1   No current facility-administered medications for this visit.    Medication Side Effects: None  Allergies:  Allergies  Allergen Reactions   Augmentin [Amoxicillin-Pot Clavulanate] Hives    Past Medical History:  Diagnosis Date   Acid reflux    ADD (attention deficit disorder)    Anemia    Anxiety    Arthritis    left ankle   Back pain    Chest pain    Depression    Dyspnea    Exercise-induced asthma    Fatty liver    History of blood transfusion    History of chicken pox    History of kidney stones    Hypothyroidism    Joint pain    Lower extremity edema    Major depressive disorder    Migraines    chronic    Pneumonia    RLS (restless legs syndrome)    Thyroid disease    hypothyroid    Family History  Problem Relation Age of Onset   Depression Mother    Obesity Mother    Depression Father    Sleep apnea Father    Obesity Father    Cancer Paternal Aunt        colon   Cancer Paternal Uncle        brain cancer   Diabetes Maternal Grandfather    Arthritis Maternal Grandfather    Arthritis Maternal Grandmother    Breast cancer Maternal Grandmother    Arthritis Paternal Grandmother    Arthritis Paternal Grandfather    Migraines Neg Hx     Social History   Socioeconomic History   Marital status: Single    Spouse name: Not on file   Number of children: 0  Years of education: 26   Highest education level: Not on file  Occupational History   Occupation: Conservation officer, historic buildings  Tobacco Use   Smoking status: Never Smoker   Smokeless tobacco: Never Used  Building services engineer Use: Never used  Substance and Sexual Activity   Alcohol use: Not Currently    Alcohol/week: 0.0 standard drinks   Drug use: No   Sexual activity: Never    Comment: never sexually active  Other Topics Concern   Not on file  Social History Narrative   Unemployed   Lives alone --update 11/11/19   Seeking work   Only child   Dog 3   Lives in a one story house with father, has a cat-12/26/16-sjb      Caffeine: 2-4 cups/day   Social Determinants of Corporate investment banker Strain:    Difficulty of Paying Living Expenses: Not on file  Food Insecurity:    Worried About Programme researcher, broadcasting/film/video in the Last Year: Not on file   [employer] of Food in the Last Year: Not on file  Transportation Needs:    Lack of Transportation (Medical): Not on file   Lack of Transportation (Non-Medical): Not on file  Physical Activity:    Days of Exercise per Week: Not on file   Minutes of Exercise per Session: Not on file  Stress:    Feeling of Stress : Not on file  Social Connections:     Frequency of Communication with Friends and Family: Not on file   Frequency of Social Gatherings with Friends and Family: Not on file   Attends Religious Services: Not on file   Active Member of Clubs or Organizations: Not on file   Attends Banker Meetings: Not on file   Marital Status: Not on file  Intimate Partner Violence:    Fear of Current or Ex-Partner: Not on file   Emotionally Abused: Not on file   Physically Abused: Not on file   Sexually Abused: Not on file    Past Medical History, Surgical history, Social history, and Family history were reviewed and updated as appropriate.   Please see review of systems for further details on the patient's review from today.   Objective:   Physical Exam:  There were no vitals taken for this visit.  Physical Exam Constitutional:      General: She is not in acute distress. Musculoskeletal:        General: No deformity.  Neurological:     Mental Status: She is alert and oriented to person, place, and time.     Coordination: Coordination normal.  Psychiatric:        Attention and Perception: Attention and perception normal. She does not perceive auditory or visual hallucinations.        Mood and Affect: Mood is anxious and depressed. Affect is not labile, blunt, angry or inappropriate.        Speech: Speech normal.        Behavior: Behavior normal.        Thought Content: Thought content normal. Thought content is not paranoid or delusional. Thought content does not include homicidal or suicidal ideation. Thought content does not include homicidal or suicidal plan.        Cognition and Memory: Cognition and memory normal.        Judgment: Judgment normal.     Comments: Insight intact Mood presents as less anxious and less depressed compared to last visit.  Lab Review:     Component Value Date/Time   NA 139 09/30/2019 1047   K 3.9 09/30/2019 1047   CL 107 09/30/2019 1047   CO2 25 09/30/2019 1047    GLUCOSE 108 (H) 09/30/2019 1047   BUN 8 09/30/2019 1047   CREATININE 0.74 09/30/2019 1047   CREATININE 0.75 07/06/2016 1534   CALCIUM 8.5 (L) 09/30/2019 1047   PROT 6.8 09/30/2019 1047   ALBUMIN 3.9 09/30/2019 1047   AST 27 09/30/2019 1047   ALT 14 09/30/2019 1047   ALKPHOS 69 09/30/2019 1047   BILITOT 0.4 09/30/2019 1047   GFRNONAA >60 09/30/2019 1047   GFRAA >60 09/30/2019 1047       Component Value Date/Time   WBC 7.7 11/25/2019 1259   WBC 10.3 08/12/2019 1122   RBC 4.94 11/25/2019 1259   RBC 5.01 11/25/2019 1259   HGB 12.5 11/25/2019 1259   HGB 9.3 (L) 08/26/2019 1045   HCT 40.3 11/25/2019 1259   HCT 33.4 (L) 08/26/2019 1045   PLT 218 11/25/2019 1259   PLT 234 08/26/2019 1045   MCV 80.4 11/25/2019 1259   MCV 72 (L) 08/26/2019 1045   MCH 25.0 (L) 11/25/2019 1259   MCHC 31.0 11/25/2019 1259   RDW 22.6 (H) 11/25/2019 1259   RDW 15.4 08/26/2019 1045   LYMPHSABS 1.9 11/25/2019 1259   MONOABS 0.5 11/25/2019 1259   EOSABS 0.2 11/25/2019 1259   BASOSABS 0.0 11/25/2019 1259    No results found for: POCLITH, LITHIUM   No results found for: PHENYTOIN, PHENOBARB, VALPROATE, CBMZ   .res Assessment: Plan:   Discussed treatment plan and pt would like to re-start Lamictal since this has been helpful for her mood and anxiety in the past. Will start Lamictal 25 mg po qd for 2 weeks, then increase to 50 mg po qd for mood s/s. Advised pt to monitor for rash and to contact office immediately if she develops a rash.  Continue Trazodone 50 mg 1/2-2 tabs po QHS for insomnia.  Continue Sertraline 200 mg po qd for mood and anxiety.  Continue Ativan 0.5 mg prn anxiety.  Recommend continuing therapy.  Pt to follow-up with this provider in 4 weeks or sooner if clinically indicated.  Patient advised to contact office with any questions, adverse effects, or acute worsening in signs and symptoms.  Arine was seen today for depression and anxiety.  Diagnoses and all orders for this  visit:  Social phobia -     lamoTRIgine (LAMICTAL) 25 MG tablet; Take 1 tablet (25 mg total) by mouth daily for 14 days, THEN 2 tablets (50 mg total) daily for 14 days.  Insomnia, unspecified type -     traZODone (DESYREL) 50 MG tablet; Take 1/2-2 po QHS prn insomnia  Major depressive disorder, recurrent episode, moderate (HCC) -     lamoTRIgine (LAMICTAL) 25 MG tablet; Take 1 tablet (25 mg total) by mouth daily for 14 days, THEN 2 tablets (50 mg total) daily for 14 days.     Please see After Visit Summary for patient specific instructions.  Future Appointments  Date Time Provider Department Center  01/14/2020  8:00 PM [facility] None  01/20/2020 12:40 PM [name], NP [facility]  01/25/2020  1:15 PM [facility] None  01/25/2020  1:45 PM [place], Brand Males, NP [facility] None  02/11/2020  2:00 PM Corie Chiquito, PMHNP CP-CP None  02/16/2020  2:00 PM Butch Penny, NP [facility] None  03/04/2020 11:00 AM [place], Lavona Mound, DO  [facility] None  03/14/2020  3:00 PM [facility] 2 [facility]    No orders of the defined types were placed in this encounter.   -------------------------------

## 2020-01-05 NOTE — Telephone Encounter (Signed)
The patient is correct. She should have her follow up CT Chest with contrast in mid December with a follow up visit after her scan. She should be scheduled for full PFT with lung volumes and DLCO at her earliest convenience. If I have not previously placed the order, please place one for me.   Thanks, Cletis [place]

## 2020-01-08 ENCOUNTER — Ambulatory Visit (HOSPITAL_COMMUNITY): Payer: BC Managed Care – PPO

## 2020-01-13 DIAGNOSIS — F4323 Adjustment disorder with mixed anxiety and depressed mood: Secondary | ICD-10-CM | POA: Diagnosis not present

## 2020-01-19 DIAGNOSIS — F4323 Adjustment disorder with mixed anxiety and depressed mood: Secondary | ICD-10-CM | POA: Diagnosis not present

## 2020-01-20 ENCOUNTER — Ambulatory Visit (INDEPENDENT_AMBULATORY_CARE_PROVIDER_SITE_OTHER): Payer: BC Managed Care – PPO | Admitting: Family

## 2020-01-20 ENCOUNTER — Other Ambulatory Visit: Payer: Self-pay

## 2020-01-20 VITALS — BP 139/73 | HR 94 | Temp 98.6°F | Resp 16 | Ht 67.0 in | Wt 367.0 lb

## 2020-01-20 DIAGNOSIS — Z23 Encounter for immunization: Secondary | ICD-10-CM | POA: Diagnosis not present

## 2020-01-20 DIAGNOSIS — R590 Localized enlarged lymph nodes: Secondary | ICD-10-CM | POA: Diagnosis not present

## 2020-01-20 DIAGNOSIS — D500 Iron deficiency anemia secondary to blood loss (chronic): Secondary | ICD-10-CM | POA: Diagnosis not present

## 2020-01-20 DIAGNOSIS — E039 Hypothyroidism, unspecified: Secondary | ICD-10-CM | POA: Diagnosis not present

## 2020-01-20 DIAGNOSIS — Z6841 Body Mass Index (BMI) 40.0 and over, adult: Secondary | ICD-10-CM

## 2020-01-20 NOTE — Progress Notes (Signed)
Subjective:    Patient ID: Margaret Golden, female    DOB: 02-Jan-1991, 29 y.o.   MRN: [id number]  HPI  Patient is a 29 yr old female who presents today for follow up.  Hypothyroidism- Reports feeling well on current dose of synthroid.   Lab Results  Component Value Date   TSH 0.66 11/20/2019   Morbid obesity- following with the medical weight management clinic.  Reports that she has not been following the plan due stress eating.  States that when she is compliant she sees results.   Wt Readings from Last 3 Encounters:  01/20/20 (!) 367 lb (166.5 kg)  12/21/19 (!) 364 lb (165.1 kg)  12/18/19 (!) 367 lb 3.2 oz (166.6 kg)   She saw Dr. Brett Fairy who recommended attended sleep study. Pt had to reschedule her sleep study.    Mediastinal LAD- met with Dr. Freda Jackson on 12/18/19. Plan is a 3 month follow up scan to monitor pulmonary nodules and LAD. It is felt that the most likely cause of the LAD would be sarcoidosis.   She complains of left ear pain.   Review of Systems See HPI  Past Medical History:  Diagnosis Date  . Acid reflux   . ADD (attention deficit disorder)   . Anemia   . Anxiety   . Arthritis    left ankle  . Back pain   . Chest pain   . Depression   . Dyspnea   . Exercise-induced asthma   . Fatty liver   . History of blood transfusion   . History of chicken pox   . History of kidney stones   . Hypothyroidism   . Joint pain   . Lower extremity edema   . Major depressive disorder   . Migraines    chronic  . Pneumonia   . RLS (restless legs syndrome)   . Thyroid disease    hypothyroid     Social History   Socioeconomic History  . Marital status: Single    Spouse name: Not on file  . Number of children: 0  . Years of education: 57  . Highest education level: Not on file  Occupational History  . Occupation: IT sales professional  Tobacco Use  . Smoking status: Never Smoker  . Smokeless tobacco: Never Used  Vaping Use  . Vaping Use:  Never used  Substance and Sexual Activity  . Alcohol use: Not Currently    Alcohol/week: 0.0 standard drinks  . Drug use: No  . Sexual activity: Never    Comment: never sexually active  Other Topics Concern  . Not on file  Social History Narrative   Unemployed   Lives alone --update 11/11/19   Seeking work   Only child   Dog 3   Lives in a one story house with father, has a cat-12/26/16-sjb      Caffeine: 2-4 cups/day   Social Determinants of Health   Financial Resource Strain:   . Difficulty of Paying Living Expenses: Not on file  Food Insecurity:   . Worried About Charity fundraiser in the Last Year: Not on file  . Ran Out of Food in the Last Year: Not on file  Transportation Needs:   . Lack of Transportation (Medical): Not on file  . Lack of Transportation (Non-Medical): Not on file  Physical Activity:   . Days of Exercise per Week: Not on file  . Minutes of Exercise per Session: Not on file  Stress:   .  Feeling of Stress : Not on file  Social Connections:   . Frequency of Communication with Friends and Family: Not on file  . Frequency of Social Gatherings with Friends and Family: Not on file  . Attends Religious Services: Not on file  . Active Member of Clubs or Organizations: Not on file  . Attends Archivist Meetings: Not on file  . Marital Status: Not on file  Intimate Partner Violence:   . Fear of Current or Ex-Partner: Not on file  . Emotionally Abused: Not on file  . Physically Abused: Not on file  . Sexually Abused: Not on file    Past Surgical History:  Procedure Laterality Date  . KNEE ARTHROSCOPY Right 2007  . TONSILLECTOMY  2010  . WISDOM TOOTH EXTRACTION      Family History  Problem Relation Age of Onset  . Depression Mother   . Obesity Mother   . Depression Father   . Sleep apnea Father   . Obesity Father   . Cancer Paternal Aunt        colon  . Cancer Paternal Uncle        brain cancer  . Diabetes Maternal Grandfather   .  Arthritis Maternal Grandfather   . Arthritis Maternal Grandmother   . Breast cancer Maternal Grandmother   . Arthritis Paternal Grandmother   . Arthritis Paternal Grandfather   . Migraines Neg Hx     Allergies  Allergen Reactions  . Augmentin [Amoxicillin-Pot Clavulanate] Hives    Current Outpatient Medications on File Prior to Visit  Medication Sig Dispense Refill  . albuterol (VENTOLIN HFA) 108 (90 Base) MCG/ACT inhaler Inhale 2 puffs into the lungs every 6 (six) hours as needed for wheezing or shortness of breath (Chest tightness). 18 g 0  . doxycycline (VIBRA-TABS) 100 MG tablet Take 1 tablet (100 mg total) by mouth 2 (two) times daily. 20 tablet 0  . furosemide (LASIX) 20 MG tablet Take 1 tablet (20 mg total) by mouth 2 (two) times a week. 26 tablet 0  . Iron-FA-B Cmp-C-Biot-Probiotic (FUSION PLUS) CAPS One capsule twice daily with orange juice. Stop your Ferrous sulfate. 30 capsule 1  . lamoTRIgine (LAMICTAL) 25 MG tablet Take 1 tablet (25 mg total) by mouth daily for 14 days, THEN 2 tablets (50 mg total) daily for 14 days. 45 tablet 0  . levothyroxine (SYNTHROID) 100 MCG tablet Take 100 mcg by mouth daily before breakfast.    . levothyroxine (SYNTHROID) 112 MCG tablet Take 1 tablet (112 mcg total) by mouth daily. 90 tablet 0  . LORazepam (ATIVAN) 0.5 MG tablet Take 1 tablet (0.5 mg total) by mouth every 8 (eight) hours. 30 tablet 1  . Multiple Vitamins-Minerals (MULTIVITAMIN WITH MINERALS) tablet Take 1 tablet by mouth daily.    Marland Kitchen omeprazole (PRILOSEC) 20 MG capsule Take 2 capsules (40 mg total) by mouth daily. 180 capsule 2  . potassium chloride SA (KLOR-CON) 20 MEQ tablet Take 1 tablet (20 mEq total) by mouth 2 (two) times a week. 26 tablet 0  . propranolol (INDERAL) 20 MG tablet Take 1 tablet (20 mg total) by mouth daily. 30 tablet 3  . rizatriptan (MAXALT-MLT) 10 MG disintegrating tablet Take 1 tablet (10 mg total) by mouth as needed for migraine. May repeat in 2 hours if needed  9 tablet 11  . sertraline (ZOLOFT) 100 MG tablet Take 200 mg by mouth daily.    Marland Kitchen topiramate (TOPAMAX) 50 MG tablet Start with 76m(1 tab) at bedtime.  In 1 week increase to 187m(2 tabs) at bedtime. (Patient taking differently: Take 100 mg by mouth at bedtime. ) 180 tablet 6  . traZODone (DESYREL) 50 MG tablet Take 1/2-2 po QHS prn insomnia 60 tablet 1  . Vitamin D, Ergocalciferol, (DRISDOL) 1.25 MG (50000 UNIT) CAPS capsule Take 1 capsule (50,000 Units total) by mouth every 7 (seven) days. 4 capsule 0   No current facility-administered medications on file prior to visit.    BP 139/73 (BP Location: Right Arm, Patient Position: Sitting, Cuff Size: Large)   Pulse 94   Temp 98.6 F (37 C) (Oral)   Resp 16   Ht '5\' 7"'  (1.702 m)   Wt (!) 367 lb (166.5 kg)   SpO2 100%   BMI 57.48 kg/m       Objective:   Physical Exam Constitutional:      Appearance: She is well-developed.  HENT:     Right Ear: Ear canal normal. Tympanic membrane is not erythematous, retracted or bulging.     Left Ear: Ear canal normal. Tympanic membrane is retracted.  Neck:     Thyroid: No thyromegaly.  Cardiovascular:     Rate and Rhythm: Normal rate and regular rhythm.     Heart sounds: Normal heart sounds. No murmur heard.   Pulmonary:     Effort: Pulmonary effort is normal. No respiratory distress.     Breath sounds: Normal breath sounds. No wheezing.  Musculoskeletal:     Cervical back: Neck supple.  Skin:    General: Skin is warm and dry.  Neurological:     Mental Status: She is alert and oriented to person, place, and time.  Psychiatric:        Behavior: Behavior normal.        Thought Content: Thought content normal.        Judgment: Judgment normal.           Assessment & Plan:  Eustachian tube dysfunction- recommended trial of flonase.  Morbid obesity- working with the weight loss clinic.    Hypothyroid- clinically stable on synthroid 1077m once daily.   Mediastinal LAD- being  followed by pulmonology with plan for repeat CT scan.  Iron deficiency anemia- due to heavy menstrual periods.  She did receive cardiac clearance for planned GYN procedure to have IUD placed.  I advised pt to call her GYN to schedule. She continues to follow with hematology.   This visit occurred during the SARS-CoV-2 public health emergency.  Safety protocols were in place, including screening questions prior to the visit, additional usage of staff PPE, and extensive cleaning of exam room while observing appropriate contact time as indicated for disinfecting solutions.

## 2020-01-20 NOTE — Patient Instructions (Signed)
Please call your GYN to schedule your procedure. Call pulmonology to schedule your breathing test. Continue current dose of synthroid.

## 2020-01-25 ENCOUNTER — Inpatient Hospital Stay (HOSPITAL_BASED_OUTPATIENT_CLINIC_OR_DEPARTMENT_OTHER): Payer: BC Managed Care – PPO | Admitting: Family

## 2020-01-25 ENCOUNTER — Inpatient Hospital Stay: Payer: BC Managed Care – PPO | Attending: Family

## 2020-01-25 ENCOUNTER — Telehealth: Payer: Self-pay | Admitting: Family

## 2020-01-25 ENCOUNTER — Other Ambulatory Visit: Payer: Self-pay

## 2020-01-25 DIAGNOSIS — N920 Excessive and frequent menstruation with regular cycle: Secondary | ICD-10-CM | POA: Insufficient documentation

## 2020-01-25 DIAGNOSIS — D500 Iron deficiency anemia secondary to blood loss (chronic): Secondary | ICD-10-CM | POA: Insufficient documentation

## 2020-01-25 LAB — CBC WITH DIFFERENTIAL (CANCER CENTER ONLY)
Abs Immature Granulocytes: 0.09 10*3/uL — ABNORMAL HIGH (ref 0.00–0.07)
Basophils Absolute: 0.1 10*3/uL (ref 0.0–0.1)
Basophils Relative: 1 %
Eosinophils Absolute: 0.3 10*3/uL (ref 0.0–0.5)
Eosinophils Relative: 3 %
HCT: 43.3 % (ref 36.0–46.0)
Hemoglobin: 14.5 g/dL (ref 12.0–15.0)
Immature Granulocytes: 1 %
Lymphocytes Relative: 23 %
Lymphs Abs: 2.1 10*3/uL (ref 0.7–4.0)
MCH: 29.1 pg (ref 26.0–34.0)
MCHC: 33.5 g/dL (ref 30.0–36.0)
MCV: 86.9 fL (ref 80.0–100.0)
Monocytes Absolute: 0.5 10*3/uL (ref 0.1–1.0)
Monocytes Relative: 5 %
Neutro Abs: 6.3 10*3/uL (ref 1.7–7.7)
Neutrophils Relative %: 67 %
Platelet Count: 194 10*3/uL (ref 150–400)
RBC: 4.98 MIL/uL (ref 3.87–5.11)
RDW: 14.3 % (ref 11.5–15.5)
WBC Count: 9.3 10*3/uL (ref 4.0–10.5)
nRBC: 0 % (ref 0.0–0.2)

## 2020-01-25 LAB — RETICULOCYTES
Immature Retic Fract: 20.9 % — ABNORMAL HIGH (ref 2.3–15.9)
RBC.: 5.01 MIL/uL (ref 3.87–5.11)
Retic Count, Absolute: 179.9 10*3/uL (ref 19.0–186.0)
Retic Ct Pct: 3.6 % — ABNORMAL HIGH (ref 0.4–3.1)

## 2020-01-25 NOTE — Progress Notes (Signed)
Hematology and Oncology Follow Up Visit  Margaret Golden [id number] Mar 30, 1991 30 y.o. 01/25/2020   Principle Diagnosis:  Iron deficiency anemia secondary to heavy irregular cycles  Current Therapy:        IV iron as indicated    Interim History:  [name] is here today for follow-up. She is doing fairly well but does feel fatigued.  She had her cycle last week which she describes as heavy. She is regular lasting 4-7 days.  She occasionally has constipation and notes a little bright red blood on her toilet tissue with straining.  No abnormal bruising or petechia.  She states that cardiology did clear her to have an IUD placed and she is working on scheduling that procedure.  She has some mild dizziness and SOB at times with exertion.  She has history of migraines.  No fever, chills, n/v, cough, rash, chest pain, palpitations, abdominal pain or changes in bowel or bladder habits.  No tenderness, numbness or tingling in her extremities at this time. She has occasional positional tingling in her right hand when writting.  She has chronic swelling in her ankles and feet that waxes and wanes.  No falls or syncopal episodes to report.  She has maintained a good appetite but states that she needs to better hydrate throughout the day.   ECOG Performance Status: 1 - Symptomatic but completely ambulatory  Medications:  Allergies as of 01/25/2020      Reactions   Augmentin [amoxicillin-pot Clavulanate] Hives      Medication List       Accurate as of January 25, 2020  1:51 PM. If you have any questions, ask your nurse or doctor.        albuterol 108 (90 Base) MCG/ACT inhaler Commonly known as: VENTOLIN HFA Inhale 2 puffs into the lungs every 6 (six) hours as needed for wheezing or shortness of breath (Chest tightness).   doxycycline 100 MG tablet Commonly known as: VIBRA-TABS Take 1 tablet (100 mg total) by mouth 2 (two) times daily.   furosemide 20 MG tablet Commonly  known as: LASIX Take 1 tablet (20 mg total) by mouth 2 (two) times a week.   Fusion Plus Caps One capsule twice daily with orange juice. Stop your Ferrous sulfate.   lamoTRIgine 25 MG tablet Commonly known as: LaMICtal Take 1 tablet (25 mg total) by mouth daily for 14 days, THEN 2 tablets (50 mg total) daily for 14 days. Start taking on: January 05, 2020   levothyroxine 100 MCG tablet Commonly known as: SYNTHROID Take 100 mcg by mouth daily before breakfast.   LORazepam 0.5 MG tablet Commonly known as: Ativan Take 1 tablet (0.5 mg total) by mouth every 8 (eight) hours.   multivitamin with minerals tablet Take 1 tablet by mouth daily.   omeprazole 20 MG capsule Commonly known as: PRILOSEC Take 2 capsules (40 mg total) by mouth daily.   potassium chloride SA 20 MEQ tablet Commonly known as: KLOR-CON Take 1 tablet (20 mEq total) by mouth 2 (two) times a week.   propranolol 20 MG tablet Commonly known as: INDERAL Take 1 tablet (20 mg total) by mouth daily.   rizatriptan 10 MG disintegrating tablet Commonly known as: MAXALT-MLT Take 1 tablet (10 mg total) by mouth as needed for migraine. May repeat in 2 hours if needed   sertraline 100 MG tablet Commonly known as: ZOLOFT Take 200 mg by mouth daily.   topiramate 50 MG tablet Commonly known as: Topamax Start with  50mg (1 tab) at bedtime. In 1 week increase to 100mg (2 tabs) at bedtime. What changed:   how much to take  how to take this  when to take this  additional instructions   traZODone 50 MG tablet Commonly known as: DESYREL Take 1/2-2 po QHS prn insomnia   Vitamin D (Ergocalciferol) 1.25 MG (50000 UNIT) Caps capsule Commonly known as: DRISDOL Take 1 capsule (50,000 Units total) by mouth every 7 (seven) days.       Allergies:  Allergies  Allergen Reactions  . Augmentin [Amoxicillin-Pot Clavulanate] Hives    Past Medical History, Surgical history, Social history, and Family History were reviewed and  updated.  Review of Systems: All other 10 point review of systems is negative.   Physical Exam:  vitals were not taken for this visit.   Wt Readings from Last 3 Encounters:  01/20/20 (!) 367 lb (166.5 kg)  12/21/19 (!) 364 lb (165.1 kg)  12/18/19 (!) 367 lb 3.2 oz (166.6 kg)    Ocular: Sclerae unicteric, pupils equal, round and reactive to light Ear-nose-throat: Oropharynx clear, dentition fair Lymphatic: No cervical or supraclavicular adenopathy Lungs no rales or rhonchi, good excursion bilaterally Heart regular rate and rhythm, no murmur appreciated Abd soft, nontender, positive bowel sounds MSK no focal spinal tenderness, no joint edema Neuro: non-focal, well-oriented, appropriate affect Breasts: Deferred   Lab Results  Component Value Date   WBC 7.7 11/25/2019   HGB 12.5 11/25/2019   HCT 40.3 11/25/2019   MCV 80.4 11/25/2019   PLT 218 11/25/2019   Lab Results  Component Value Date   FERRITIN 86 11/25/2019   IRON 52 11/25/2019   TIBC 351 11/25/2019   UIBC 299 11/25/2019   IRONPCTSAT 15 (L) 11/25/2019   Lab Results  Component Value Date   RETICCTPCT 2.8 11/25/2019   RBC 4.94 11/25/2019   RBC 5.01 11/25/2019   No results found for: KPAFRELGTCHN, LAMBDASER, KAPLAMBRATIO No results found for: IGGSERUM, IGA, IGMSERUM No results found for: 11/27/2019, SPEI   Chemistry      Component Value Date/Time   NA 139 09/30/2019 1047   K 3.9 09/30/2019 1047   CL 107 09/30/2019 1047   CO2 25 09/30/2019 1047   BUN 8 09/30/2019 1047   CREATININE 0.74 09/30/2019 1047   CREATININE 0.75 07/06/2016 1534      Component Value Date/Time   CALCIUM 8.5 (L) 09/30/2019 1047   ALKPHOS 69 09/30/2019 1047   AST 27 09/30/2019 1047   ALT 14 09/30/2019 1047   BILITOT 0.4 09/30/2019 1047       Impression and Plan: [name] is a very pleasant29 yo caucasian female with long history of iron deficiency anemia secondary to  heavy irregular cycles.  Iron studies are pending and we will replace if needed.  Follow-up in 3 months.  She was encouraged to contact our office with any questions or concerns.   10/02/2019, NP 10/25/20211:51 PM

## 2020-01-25 NOTE — Telephone Encounter (Signed)
Appointments scheduled calendar printed per 10/25 los 

## 2020-01-26 ENCOUNTER — Other Ambulatory Visit: Payer: Self-pay | Admitting: Family

## 2020-01-26 DIAGNOSIS — F4323 Adjustment disorder with mixed anxiety and depressed mood: Secondary | ICD-10-CM | POA: Diagnosis not present

## 2020-01-26 LAB — IRON AND TIBC
Iron: 68 ug/dL (ref 41–142)
Saturation Ratios: 18 % — ABNORMAL LOW (ref 21–57)
TIBC: 366 ug/dL (ref 236–444)
UIBC: 299 ug/dL (ref 120–384)

## 2020-01-26 LAB — FERRITIN: Ferritin: 80 ng/mL (ref 11–307)

## 2020-01-27 ENCOUNTER — Telehealth: Payer: Self-pay | Admitting: Family

## 2020-01-27 NOTE — Telephone Encounter (Signed)
Called and LMVM for patient regarding appointment scheduled for iron infusion per  10/26 [place] msg

## 2020-02-01 DIAGNOSIS — F4323 Adjustment disorder with mixed anxiety and depressed mood: Secondary | ICD-10-CM | POA: Diagnosis not present

## 2020-02-02 ENCOUNTER — Telehealth: Payer: Self-pay | Admitting: Family

## 2020-02-02 ENCOUNTER — Other Ambulatory Visit: Payer: Self-pay

## 2020-02-02 ENCOUNTER — Inpatient Hospital Stay: Payer: BC Managed Care – PPO | Attending: Family

## 2020-02-02 DIAGNOSIS — N920 Excessive and frequent menstruation with regular cycle: Secondary | ICD-10-CM | POA: Insufficient documentation

## 2020-02-02 DIAGNOSIS — D500 Iron deficiency anemia secondary to blood loss (chronic): Secondary | ICD-10-CM | POA: Insufficient documentation

## 2020-02-02 NOTE — Telephone Encounter (Signed)
Phone number : [phone]  Patient father would like a phone call to discuss patient condition. He would like a referral allergist.

## 2020-02-02 NOTE — Progress Notes (Signed)
Multiple attempts for PIV access. Pt will be rescheduled, encouraged to drink fluids. Discussed with pt PIV access is limited. Pt will/would like to consider a PAC or PICC line.

## 2020-02-02 NOTE — Patient Instructions (Signed)
Pt discharged in no apparent distress. Pt left ambulatory without assistance. Pt aware of discharge instructions and verbalized understanding and had no further questions.  

## 2020-02-03 ENCOUNTER — Telehealth: Payer: Self-pay

## 2020-02-03 DIAGNOSIS — Z9109 Other allergy status, other than to drugs and biological substances: Secondary | ICD-10-CM

## 2020-02-03 DIAGNOSIS — F4323 Adjustment disorder with mixed anxiety and depressed mood: Secondary | ICD-10-CM | POA: Diagnosis not present

## 2020-02-03 NOTE — Telephone Encounter (Signed)
Patient would like an appointment for iud insertion. °

## 2020-02-03 NOTE — Telephone Encounter (Signed)
Spoke with dad.  Gave update on testing results and status.  He would like the patient to be referred to an allergist due to some allergy symptoms.  Will arrange.

## 2020-02-03 NOTE — Telephone Encounter (Signed)
Patient's father will like to speak to provider "to be updated on her status". He will also like for her to be referred to a specialist due to running out of breath quicker than usual.  Correct phone number is [phone]

## 2020-02-04 ENCOUNTER — Telehealth: Payer: Self-pay

## 2020-02-04 NOTE — Telephone Encounter (Signed)
If ready to proceed, will need to see Dr. Oscar La or Edward Jolly for this.  Thanks.

## 2020-02-04 NOTE — Telephone Encounter (Signed)
Spoke with pt. Pt states would like to have Kyleena IUD insertion with hysteroscopy D&C.  Pt was having atypical chest pain and states has been cleared by cardio. See Epic notes from Dr Servando Salina on 10/06/19.   Pt also had recent Chest CT on 12/17/19.  Routing to Dr Hyacinth Meeker for review, please advise

## 2020-02-04 NOTE — Telephone Encounter (Signed)
Margaret Golden 19 hours ago (3:23 PM)  GW   Patient would like an appointment for iud insertion.      Documentation   [name], [name] [phone]  Margaret Golden

## 2020-02-05 ENCOUNTER — Other Ambulatory Visit: Payer: Self-pay | Admitting: Family

## 2020-02-05 NOTE — Telephone Encounter (Signed)
Spoke with pt. Pt given update per Dr Hyacinth Meeker. Pt agreeable to see other provider for surgery consult. Pt scheduled with Dr Oscar La as first available on 11/23 at 4 pm. Pt agreeable to date and time of appt. Routing to Dr Oscar La for review Encounter closed

## 2020-02-08 DIAGNOSIS — F4323 Adjustment disorder with mixed anxiety and depressed mood: Secondary | ICD-10-CM | POA: Diagnosis not present

## 2020-02-09 ENCOUNTER — Other Ambulatory Visit: Payer: Self-pay

## 2020-02-09 ENCOUNTER — Inpatient Hospital Stay: Payer: BC Managed Care – PPO

## 2020-02-09 VITALS — BP 143/73 | HR 72 | Temp 98.0°F | Resp 17

## 2020-02-09 DIAGNOSIS — N920 Excessive and frequent menstruation with regular cycle: Secondary | ICD-10-CM

## 2020-02-09 DIAGNOSIS — D500 Iron deficiency anemia secondary to blood loss (chronic): Secondary | ICD-10-CM

## 2020-02-09 MED ORDER — SODIUM CHLORIDE 0.9 % IV SOLN
200.0000 mg | Freq: Once | INTRAVENOUS | Status: AC
  Administered 2020-02-09: 200 mg via INTRAVENOUS
  Filled 2020-02-09: qty 200

## 2020-02-09 MED ORDER — SODIUM CHLORIDE 0.9 % IV SOLN
Freq: Once | INTRAVENOUS | Status: AC
  Filled 2020-02-09: qty 250

## 2020-02-09 NOTE — Progress Notes (Signed)
Pt. refused to wait 30 minutes post infusion.VS WNL (see flowsheet) upon discharge. Released stable and asymptomatic.

## 2020-02-09 NOTE — Progress Notes (Signed)
Pt ambulatory to infusion area without difficulty. Multiple attempts to place PIV attempted by different RN's unsuccessfully. Pt tolerated without difficulty. Corrie Dandy RN discussed port a cath placement with patient. Pt was shown example of port a cath and benefits of port a cath reviewed. Pt verbalized understanding and had no further questions. Pt stated she is still not sure she is ready for port a cath.

## 2020-02-09 NOTE — Patient Instructions (Signed)

## 2020-02-10 ENCOUNTER — Ambulatory Visit (INDEPENDENT_AMBULATORY_CARE_PROVIDER_SITE_OTHER): Payer: BC Managed Care – PPO | Admitting: Neurology

## 2020-02-10 DIAGNOSIS — E669 Obesity, unspecified: Secondary | ICD-10-CM

## 2020-02-10 DIAGNOSIS — H4710 Unspecified papilledema: Secondary | ICD-10-CM

## 2020-02-10 DIAGNOSIS — G4733 Obstructive sleep apnea (adult) (pediatric): Secondary | ICD-10-CM | POA: Diagnosis not present

## 2020-02-10 DIAGNOSIS — G43709 Chronic migraine without aura, not intractable, without status migrainosus: Secondary | ICD-10-CM

## 2020-02-10 DIAGNOSIS — R519 Headache, unspecified: Secondary | ICD-10-CM

## 2020-02-10 DIAGNOSIS — E662 Morbid (severe) obesity with alveolar hypoventilation: Secondary | ICD-10-CM

## 2020-02-11 ENCOUNTER — Other Ambulatory Visit: Payer: Self-pay

## 2020-02-11 ENCOUNTER — Ambulatory Visit: Payer: BC Managed Care – PPO | Admitting: Psychiatry

## 2020-02-11 NOTE — Progress Notes (Unsigned)
Margaret Golden [id number] [birth year] 28 y.o.  Subjective:   Patient ID:  Margaret Golden is a 30 y.o. (DOB 1990-07-04) female.  Chief Complaint: No chief complaint on file.   HPI Margaret Golden presents to the office today for follow-up of ***   PHQ2-9     Office Visit from 09/03/2019 in [facility] Nutrition from 02/10/2019 [facility] Office Visit from 05/02/2018 in [facility] at [facility] [employer] Office Visit from 04/19/2017 in [facility] at [facility] Nutrition from 02/20/2016 in [facility]  PHQ-2 Total Score 3 2 4 3 4   PHQ-9 Total Score 14 -- 15 16 --       Review of Systems:  Review of Systems  Medications: {medication reviewed/display:3041432}  Current Outpatient Medications  Medication Sig Dispense Refill  . albuterol (VENTOLIN HFA) 108 (90 Base) MCG/ACT inhaler Inhale 2 puffs into the lungs every 6 (six) hours as needed for wheezing or shortness of breath (Chest tightness). 18 g 0  . doxycycline (VIBRA-TABS) 100 MG tablet Take 1 tablet (100 mg total) by mouth 2 (two) times daily. 20 tablet 0  . furosemide (LASIX) 20 MG tablet Take 1 tablet (20 mg total) by mouth 2 (two) times a week. 26 tablet 0  . Iron-FA-B Cmp-C-Biot-Probiotic (FUSION PLUS) CAPS One capsule twice daily with orange juice. Stop your Ferrous sulfate. 30 capsule 1  . lamoTRIgine (LAMICTAL) 25 MG tablet Take 1 tablet (25 mg total) by mouth daily for 14 days, THEN 2 tablets (50 mg total) daily for 14 days. 45 tablet 0  . levothyroxine (SYNTHROID) 100 MCG tablet Take 100 mcg by mouth daily before breakfast.    . LORazepam (ATIVAN) 0.5 MG tablet Take 1 tablet (0.5 mg total) by mouth every 8 (eight) hours. 30 tablet 1  . Multiple Vitamins-Minerals (MULTIVITAMIN WITH MINERALS) tablet Take 1 tablet by mouth daily.    omeprazole (PRILOSEC) 20 MG capsule Take 2 capsules (40 mg total)  by mouth daily. 180 capsule 2  . potassium chloride SA (KLOR-CON) 20 MEQ tablet Take 1 tablet (20 mEq total) by mouth 2 (two) times a week. 26 tablet 0  . propranolol (INDERAL) 20 MG tablet Take 1 tablet (20 mg total) by mouth daily. 30 tablet 3  . rizatriptan (MAXALT-MLT) 10 MG disintegrating tablet Take 1 tablet (10 mg total) by mouth as needed for migraine. May repeat in 2 hours if needed 9 tablet 11  . sertraline (ZOLOFT) 100 MG tablet Take 200 mg by mouth daily.    Marland Kitchen topiramate (TOPAMAX) 50 MG tablet Start with 50mg (1 tab) at bedtime. In 1 week increase to 100mg (2 tabs) at bedtime. (Patient taking differently: Take 100 mg by mouth at bedtime. ) 180 tablet 6  . traZODone (DESYREL) 50 MG tablet Take 1/2-2 po QHS prn insomnia 60 tablet 1  . Vitamin D, Ergocalciferol, (DRISDOL) 1.25 MG (50000 UNIT) CAPS capsule Take 1 capsule (50,000 Units total) by mouth every 7 (seven) days. 4 capsule 0   No current facility-administered medications for this visit.    Medication Side Effects: {Medication Side Effects (Optional):21014029}  Allergies:  Allergies  Allergen Reactions  . Augmentin [Amoxicillin-Pot Clavulanate] Hives    Past Medical History:  Diagnosis Date  . Acid reflux   . ADD (attention deficit disorder)   . Anemia   . Anxiety   . Arthritis    left ankle  . Back pain   .  Chest pain   . Depression   . Dyspnea   . Exercise-induced asthma   . Fatty liver   . History of blood transfusion   . History of chicken pox   . History of kidney stones   . Hypothyroidism   . Joint pain   . Lower extremity edema   . Major depressive disorder   . Migraines    chronic  . Pneumonia   . RLS (restless legs syndrome)   . Thyroid disease    hypothyroid    Family History  Problem Relation Age of Onset  . Depression Mother   . Obesity Mother   . Depression Father   . Sleep apnea Father   . Obesity Father   . Cancer Paternal Aunt        colon  . Cancer Paternal Uncle        brain  cancer  . Diabetes Maternal Grandfather   . Arthritis Maternal Grandfather   . Arthritis Maternal Grandmother   . Breast cancer Maternal Grandmother   . Arthritis Paternal Grandmother   . Arthritis Paternal Grandfather   . Migraines Neg Hx     Social History   Socioeconomic History  . Marital status: Single    Spouse name: Not on file  . Number of children: 0  . Years of education: 57  . Highest education level: Not on file  Occupational History  . Occupation: Conservation officer, historic buildings  Tobacco Use  . Smoking status: Never Smoker  . Smokeless tobacco: Never Used  Vaping Use  . Vaping Use: Never used  Substance and Sexual Activity  . Alcohol use: Not Currently    Alcohol/week: 0.0 standard drinks  . Drug use: No  . Sexual activity: Never    Comment: never sexually active  Other Topics Concern  . Not on file  Social History Narrative   Unemployed   Lives alone --update 11/11/19   Seeking work   Only child   Dog 3   Lives in a one story house with father, has a cat-12/26/16-sjb      Caffeine: 2-4 cups/day   Social Determinants of Health   Financial Resource Strain:   . Difficulty of Paying Living Expenses: Not on file  Food Insecurity:   . Worried About Programme researcher, broadcasting/film/video in the Last Year: Not on file  . Ran Out of Food in the Last Year: Not on file  Transportation Needs:   . Lack of Transportation (Medical): Not on file  . Lack of Transportation (Non-Medical): Not on file  Physical Activity:   . Days of Exercise per Week: Not on file  . Minutes of Exercise per Session: Not on file  Stress:   . Feeling of Stress : Not on file  Social Connections:   . Frequency of Communication with Friends and Family: Not on file  . Frequency of Social Gatherings with Friends and Family: Not on file  . Attends Religious Services: Not on file  . Active Member of Clubs or Organizations: Not on file  . Attends Banker Meetings: Not on file  . Marital Status: Not on  file  Intimate Partner Violence:   . Fear of Current or Ex-Partner: Not on file  . Emotionally Abused: Not on file  . Physically Abused: Not on file  . Sexually Abused: Not on file    Past Medical History, Surgical history, Social history, and Family history were reviewed and updated as appropriate.   Please see review of  systems for further details on the patient's review from today.   Objective:   Physical Exam:  There were no vitals taken for this visit.  Physical Exam  Lab Review:     Component Value Date/Time   NA 139 09/30/2019 1047   K 3.9 09/30/2019 1047   CL 107 09/30/2019 1047   CO2 25 09/30/2019 1047   GLUCOSE 108 (H) 09/30/2019 1047   BUN 8 09/30/2019 1047   CREATININE 0.74 09/30/2019 1047   CREATININE 0.75 07/06/2016 1534   CALCIUM 8.5 (L) 09/30/2019 1047   PROT 6.8 09/30/2019 1047   ALBUMIN 3.9 09/30/2019 1047   AST 27 09/30/2019 1047   ALT 14 09/30/2019 1047   ALKPHOS 69 09/30/2019 1047   BILITOT 0.4 09/30/2019 1047   GFRNONAA >60 09/30/2019 1047   GFRAA >60 09/30/2019 1047       Component Value Date/Time   WBC 9.3 01/25/2020 1319   WBC 10.3 08/12/2019 1122   RBC 4.98 01/25/2020 1319   RBC 5.01 01/25/2020 1319   HGB 14.5 01/25/2020 1319   HGB 9.3 (L) 08/26/2019 1045   HCT 43.3 01/25/2020 1319   HCT 33.4 (L) 08/26/2019 1045   PLT 194 01/25/2020 1319   PLT 234 08/26/2019 1045   MCV 86.9 01/25/2020 1319   MCV 72 (L) 08/26/2019 1045   MCH 29.1 01/25/2020 1319   MCHC 33.5 01/25/2020 1319   RDW 14.3 01/25/2020 1319   RDW 15.4 08/26/2019 1045   LYMPHSABS 2.1 01/25/2020 1319   MONOABS 0.5 01/25/2020 1319   EOSABS 0.3 01/25/2020 1319   BASOSABS 0.1 01/25/2020 1319    No results found for: POCLITH, LITHIUM   No results found for: PHENYTOIN, PHENOBARB, VALPROATE, CBMZ   .res Assessment: Plan:    There are no diagnoses linked to this encounter.   Please see After Visit Summary for patient specific instructions.  Future Appointments  Date  Time Provider Department Center  02/16/2020  2:00 PM Butch Penny, NP [facility] None  02/23/2020  4:00 PM Romualdo Bolk, MD [facility] None  03/04/2020 11:00 AM Thomasene Ripple, DO [facility] None  03/14/2020  3:00 PM [facility] 2 [facility] [place]  03/23/2020  2:00 PM Ellamae Sia, DO [facility] None  04/26/2020  1:15 PM [facility] None  04/26/2020  1:45 PM [place], Brand Males, NP [facility] None    No orders of the defined types were placed in this encounter.   -------------------------------

## 2020-02-15 NOTE — Progress Notes (Addendum)
PATIENT: Margaret Golden DOB: 08-24-1990  REASON FOR VISIT: follow up HISTORY FROM: patient  HISTORY OF PRESENT ILLNESS: Today 02/15/20:  [name] is a 29 year old female with a history of migraines.  She returns today for follow-up.  She states that she wakes up at least 3 times a week with a headache.  Sometimes the headache gets better as the day goes on sometimes and lingers all day or may even get worse.  She states that with these headaches she does not have photophobia or phonophobia.  Reports some nausea but no vomiting.  She states approximately once every other week sometimes more often she will have a headache that feels like a tight headband around her head.  She denies photophobia and phonophobia.  Reports nausea.  She continues to take Topamax 100 mg at bedtime.  Reports that she tried Maxalt with no benefit.  She was referred for sleep study.  She had this last week and is awaiting results.  Returns today for evaluation.   HISTORY Margaret Golden is a 28 y.o. female here as requested by Quillian Quince, MD for migraines.  Past medical history thyroid disease, restless leg syndrome, migraines, major depressive disorder, lower extremity edema, joint pain, hypothyroidism, history of kidney stones, fatty liver, exercise-induced asthma, depression, back pain, arthritis, anxiety, ADD, HTN, obesity.  I reviewed Dr. Francena Hanly notes, patient reports chronic migraines, can have up to 10-14 headaches per month, she has been using over-the-counter Excedrin Migraine 250 mg 2 tablets as needed, she also has depression with anxiety, fatigue and low energy it may be related to obesity depression or many other causes including migraine, behavior modification was discussed and she is currently in the action stage of change for weight loss.   I was able to review neurologist Dr. Moises Blood notes he saw patient in 2019, patient reported 7 out of 10 intensity, 3-4 headache days a month which 1 to 2  days are migraines, no NSAIDs, she tried Excedrin Migraine in the past, she also tried sertraline and Topamax, lamotrigine and Horizant in the past as well, rare alcohol no smoker, depression controlled, migraine started as a teenager and became more frequent at age 28, bandlike distribution, pressure stabbing, no aura no prodrome no post room, associated symptoms include photophobia, phonophobia, nausea, no vomiting or visual disturbance often, not waking her up from sleep, can last up to 3 days with a total of 7 headache days a month initially and improved to 3-4 headache days a month at follow-up appointment.  Neurologic exam was normal.  Topiramate was continued at that time.  Migraines have gotten worse, she has been managing with excedrin and aspirin, was taking once a week but now worsening for >3 months and worsenined with anemia when she was hospitalized and migraines worsening. She is waking up with headaches, a few years ago she had a sleep test and it was negative, tired during the day, very fatigued, daily headaches in the morning and feels like a band around her head, migraines are pulsating/pounding/throbbing, photophobia/phonophobia, nausea, no vomiting, it hurts to move, she reports "slight double vision", laying down and sleeping helps, she is getting 14 migraine days a month, suffering all are moderate to severe, no medication overuse, no aura, can last 24-48 hours untreated if she catches them soon they can last a few hours. She had one that last 1.5 weeks in the past. She has associated vertigo/dizziness but no weakness or sensory changes. She drinks caffeine is trying  to cut back but she has a caffeine "addiction" and we discussed rebound headache.   From a thorough review of records: Medication tried that can be used in migraine management include ibuprofen, Aleve, Tylenol, Fioricet, sumatriptan, Zofran, propranolol, Topamax, bupropion, Excedrin Migraine, lamotrigine, Horizant,  metoprolol.  Reviewed notes, labs and imaging from outside physicians, which showed:  Ferritin 6, TSH 18.3, CMP unremarkable   REVIEW OF SYSTEMS: Out of a complete 14 system review of symptoms, the patient complains only of the following symptoms, and all other reviewed systems are negative.  See HPI  ALLERGIES: Allergies  Allergen Reactions  . Augmentin [Amoxicillin-Pot Clavulanate] Hives    HOME MEDICATIONS: Outpatient Medications Prior to Visit  Medication Sig Dispense Refill  . albuterol (VENTOLIN HFA) 108 (90 Base) MCG/ACT inhaler Inhale 2 puffs into the lungs every 6 (six) hours as needed for wheezing or shortness of breath (Chest tightness). 18 g 0  . doxycycline (VIBRA-TABS) 100 MG tablet Take 1 tablet (100 mg total) by mouth 2 (two) times daily. 20 tablet 0  . furosemide (LASIX) 20 MG tablet Take 1 tablet (20 mg total) by mouth 2 (two) times a week. 26 tablet 0  . Iron-FA-B Cmp-C-Biot-Probiotic (FUSION PLUS) CAPS One capsule twice daily with orange juice. Stop your Ferrous sulfate. 30 capsule 1  . lamoTRIgine (LAMICTAL) 25 MG tablet Take 1 tablet (25 mg total) by mouth daily for 14 days, THEN 2 tablets (50 mg total) daily for 14 days. 45 tablet 0  . levothyroxine (SYNTHROID) 100 MCG tablet Take 100 mcg by mouth daily before breakfast.    . LORazepam (ATIVAN) 0.5 MG tablet Take 1 tablet (0.5 mg total) by mouth every 8 (eight) hours. 30 tablet 1  . Multiple Vitamins-Minerals (MULTIVITAMIN WITH MINERALS) tablet Take 1 tablet by mouth daily.    Marland Kitchen omeprazole (PRILOSEC) 20 MG capsule Take 2 capsules (40 mg total) by mouth daily. 180 capsule 2  . potassium chloride SA (KLOR-CON) 20 MEQ tablet Take 1 tablet (20 mEq total) by mouth 2 (two) times a week. 26 tablet 0  . propranolol (INDERAL) 20 MG tablet Take 1 tablet (20 mg total) by mouth daily. 30 tablet 3  . rizatriptan (MAXALT-MLT) 10 MG disintegrating tablet Take 1 tablet (10 mg total) by mouth as needed for migraine. May repeat  in 2 hours if needed 9 tablet 11  . sertraline (ZOLOFT) 100 MG tablet Take 200 mg by mouth daily.    Marland Kitchen topiramate (TOPAMAX) 50 MG tablet Start with 50mg (1 tab) at bedtime. In 1 week increase to 100mg (2 tabs) at bedtime. (Patient taking differently: Take 100 mg by mouth at bedtime. ) 180 tablet 6  . traZODone (DESYREL) 50 MG tablet Take 1/2-2 po QHS prn insomnia 60 tablet 1  . Vitamin D, Ergocalciferol, (DRISDOL) 1.25 MG (50000 UNIT) CAPS capsule Take 1 capsule (50,000 Units total) by mouth every 7 (seven) days. 4 capsule 0   No facility-administered medications prior to visit.    PAST MEDICAL HISTORY: Past Medical History:  Diagnosis Date  . Acid reflux   . ADD (attention deficit disorder)   . Anemia   . Anxiety   . Arthritis    left ankle  . Back pain   . Chest pain   . Depression   . Dyspnea   . Exercise-induced asthma   . Fatty liver   . History of blood transfusion   . History of chicken pox   . History of kidney stones   . Hypothyroidism   .  Joint pain   . Lower extremity edema   . Major depressive disorder   . Migraines    chronic  . Pneumonia   . RLS (restless legs syndrome)   . Thyroid disease    hypothyroid    PAST SURGICAL HISTORY: Past Surgical History:  Procedure Laterality Date  . KNEE ARTHROSCOPY Right 2007  . TONSILLECTOMY  2010  . WISDOM TOOTH EXTRACTION      FAMILY HISTORY: Family History  Problem Relation Age of Onset  . Depression Mother   . Obesity Mother   . Depression Father   . Sleep apnea Father   . Obesity Father   . Cancer Paternal Aunt        colon  . Cancer Paternal Uncle        brain cancer  . Diabetes Maternal Grandfather   . Arthritis Maternal Grandfather   . Arthritis Maternal Grandmother   . Breast cancer Maternal Grandmother   . Arthritis Paternal Grandmother   . Arthritis Paternal Grandfather   . Migraines Neg Hx     SOCIAL HISTORY: Social History   Socioeconomic History  . Marital status: Single    Spouse  name: Not on file  . Number of children: 0  . Years of education: 3012  . Highest education level: Not on file  Occupational History  . Occupation: Conservation officer, historic buildingsullfillment employee  Tobacco Use  . Smoking status: Never Smoker  . Smokeless tobacco: Never Used  Vaping Use  . Vaping Use: Never used  Substance and Sexual Activity  . Alcohol use: Not Currently    Alcohol/week: 0.0 standard drinks  . Drug use: No  . Sexual activity: Never    Comment: never sexually active  Other Topics Concern  . Not on file  Social History Narrative   Unemployed   Lives alone --update 11/11/19   Seeking work   Only child   Dog 3   Lives in a one story house with father, has a cat-12/26/16-sjb      Caffeine: 2-4 cups/day   Social Determinants of Health   Financial Resource Strain:   . Difficulty of Paying Living Expenses: Not on file  Food Insecurity:   . Worried About Programme researcher, broadcasting/film/videounning Out of Food in the Last Year: Not on file  . Ran Out of Food in the Last Year: Not on file  Transportation Needs:   . Lack of Transportation (Medical): Not on file  . Lack of Transportation (Non-Medical): Not on file  Physical Activity:   . Days of Exercise per Week: Not on file  . Minutes of Exercise per Session: Not on file  Stress:   . Feeling of Stress : Not on file  Social Connections:   . Frequency of Communication with Friends and Family: Not on file  . Frequency of Social Gatherings with Friends and Family: Not on file  . Attends Religious Services: Not on file  . Active Member of Clubs or Organizations: Not on file  . Attends BankerClub or Organization Meetings: Not on file  . Marital Status: Not on file  Intimate Partner Violence:   . Fear of Current or Ex-Partner: Not on file  . Emotionally Abused: Not on file  . Physically Abused: Not on file  . Sexually Abused: Not on file      PHYSICAL EXAM  Vitals:   02/16/20 1356  BP: 126/84  Weight: (!) 371 lb (168.3 kg)  Height: 5\' 7"  (1.702 m)   Body mass index is  58.11 kg/m.  Generalized: Well developed, in no acute distress   Neurological examination  Mentation: Alert oriented to time, place, history taking. Follows all commands speech and language fluent Cranial nerve II-XII: Pupils were equal round reactive to light. Extraocular movements were full, visual field were full on confrontational test. Facial sensation and strength were normal. Uvula tongue midline. Head turning and shoulder shrug  were normal and symmetric. Motor: The motor testing reveals 5 over 5 strength of all 4 extremities. Good symmetric motor tone is noted throughout.  Sensory: Sensory testing is intact to soft touch on all 4 extremities. No evidence of extinction is noted.  Coordination: Cerebellar testing reveals good finger-nose-finger and heel-to-shin bilaterally.  Gait and station: Gait is normal.  Reflexes: Deep tendon reflexes are symmetric and normal bilaterally.   DIAGNOSTIC DATA (LABS, IMAGING, TESTING) - I reviewed patient records, labs, notes, testing and imaging myself where available.  Lab Results  Component Value Date   WBC 9.3 01/25/2020   HGB 14.5 01/25/2020   HCT 43.3 01/25/2020   MCV 86.9 01/25/2020   PLT 194 01/25/2020      Component Value Date/Time   NA 139 09/30/2019 1047   K 3.9 09/30/2019 1047   CL 107 09/30/2019 1047   CO2 25 09/30/2019 1047   GLUCOSE 108 (H) 09/30/2019 1047   BUN 8 09/30/2019 1047   CREATININE 0.74 09/30/2019 1047   CREATININE 0.75 07/06/2016 1534   CALCIUM 8.5 (L) 09/30/2019 1047   PROT 6.8 09/30/2019 1047   ALBUMIN 3.9 09/30/2019 1047   AST 27 09/30/2019 1047   ALT 14 09/30/2019 1047   ALKPHOS 69 09/30/2019 1047   BILITOT 0.4 09/30/2019 1047   GFRNONAA >60 09/30/2019 1047   GFRAA >60 09/30/2019 1047   Lab Results  Component Value Date   CHOL 150 09/03/2019   HDL 39 (L) 09/03/2019   LDLCALC 89 09/03/2019   TRIG 120 09/03/2019   CHOLHDL 4 07/21/2019   Lab Results  Component Value Date   HGBA1C 4.9  09/03/2019   Lab Results  Component Value Date   VITAMINB12 473 09/03/2019   Lab Results  Component Value Date   TSH 0.66 11/20/2019      ASSESSMENT AND PLAN 29 y.o. year old female  has a past medical history of Acid reflux, ADD (attention deficit disorder), Anemia, Anxiety, Arthritis, Back pain, Chest pain, Depression, Dyspnea, Exercise-induced asthma, Fatty liver, History of blood transfusion, History of chicken pox, History of kidney stones, Hypothyroidism, Joint pain, Lower extremity edema, Major depressive disorder, Migraines, Pneumonia, RLS (restless legs syndrome), and Thyroid disease. here with:  1.  Migraine headaches 2.  Morning headaches  -Continue Topamax -Morning headaches may be related to sleep apnea will await sleep study results -If sleep study is unremarkable we will make adjustments to her medication. -Follow-up in 6 months or sooner if needed   I spent 25 minutes of face-to-face and non-face-to-face time with patient.  This included previsit chart review, lab review, study review, order entry, electronic health record documentation, patient education.  Butch Penny, MSN, NP-C 02/15/2020, 5:22 PM [facility] [address] [place], [place] [address] [phone]   Made any corrections needed, and agree with history, physical, neuro exam,assessment and plan as stated.     Naomie Dean, MD [facility]

## 2020-02-16 ENCOUNTER — Encounter: Payer: Self-pay | Admitting: Adult Health

## 2020-02-16 ENCOUNTER — Ambulatory Visit (INDEPENDENT_AMBULATORY_CARE_PROVIDER_SITE_OTHER): Payer: BC Managed Care – PPO | Admitting: Adult Health

## 2020-02-16 VITALS — BP 126/84 | Ht 67.0 in | Wt 371.0 lb

## 2020-02-16 DIAGNOSIS — R519 Headache, unspecified: Secondary | ICD-10-CM

## 2020-02-16 DIAGNOSIS — G43709 Chronic migraine without aura, not intractable, without status migrainosus: Secondary | ICD-10-CM | POA: Diagnosis not present

## 2020-02-16 DIAGNOSIS — F4323 Adjustment disorder with mixed anxiety and depressed mood: Secondary | ICD-10-CM | POA: Diagnosis not present

## 2020-02-16 NOTE — Patient Instructions (Signed)
Your Plan:  Continue Topamax We will wait on sleep study results If your symptoms worsen or you develop new symptoms please let us know.   Thank you for coming to see [place] at [facility]. I hope we have been able to provide you high quality care today.  You may receive a patient satisfaction survey over the next few weeks. We would appreciate your feedback and comments so that we may continue to improve ourselves and the health of our patients.

## 2020-02-19 ENCOUNTER — Other Ambulatory Visit: Payer: Self-pay

## 2020-02-19 ENCOUNTER — Ambulatory Visit (INDEPENDENT_AMBULATORY_CARE_PROVIDER_SITE_OTHER): Payer: BC Managed Care – PPO | Admitting: Psychiatry

## 2020-02-19 ENCOUNTER — Encounter: Payer: Self-pay | Admitting: Psychiatry

## 2020-02-19 DIAGNOSIS — F900 Attention-deficit hyperactivity disorder, predominantly inattentive type: Secondary | ICD-10-CM

## 2020-02-19 DIAGNOSIS — F339 Major depressive disorder, recurrent, unspecified: Secondary | ICD-10-CM | POA: Diagnosis not present

## 2020-02-19 DIAGNOSIS — F4010 Social phobia, unspecified: Secondary | ICD-10-CM | POA: Diagnosis not present

## 2020-02-19 MED ORDER — LAMOTRIGINE 200 MG PO TABS: 200.0000 mg | ORAL_TABLET | Freq: Every day | ORAL | 3 refills | Status: DC

## 2020-02-19 MED ORDER — SERTRALINE HCL 100 MG PO TABS: 200.0000 mg | ORAL_TABLET | Freq: Every day | ORAL | 1 refills | Status: DC

## 2020-02-19 NOTE — Progress Notes (Signed)
Margaret Golden [id number] 01-05-1991 29 y.o.  Subjective:   Patient ID:  Margaret Golden is a 29 y.o. (DOB [birth year]) female.  Chief Complaint:  Chief Complaint  Patient presents with  . Follow-up    h/o depression, anxiety, and insomnia    HPI [name] presents to the office today for follow-up of depression, anxiety, and insomnia. Father visited for 2 weeks and he was able to help her do some things to her house, car, and landscaping. Her car is currently in the shop for repairs.   She reports that medical work-up about chest tightness and shortness of breath is pending and is being r/o for sarcoidiosis or lymphoma. Had sleep study and does not yet know results. Has "a little" anxiety about her health and reports that now anxiety is not constant and recognizes when she is having anxiety. She reports that anxiety is no longer constant. Reports that panic s/s have been minimal. She reports that she has used Ativan prn with good result. She reports depression "hasn't been bothering me as much."   Now able to go to sleep at 10 pm and is trying to maintain a more consistent sleep schedule. She reports that sleep does not always feel restful. Occasional early morning awakening if she goes to bed early. Appetite has been "up and down." Appetite sometimes fluctuates with emotions. She reports that she plans to try to start eating more healthy foods. Energy fluctuates and feels more tired with gloomy weather. Motivation is good. She reports that there are some things she is excited about and looking forward to. She reports that she continues to have some difficulty with concentration. Has been trying to manage distractibility. She reports some hyper-focus. Denies SI.   Reconnected with friend in [place] and was introduced to one of his friends and they have hit it off.   Father is doing well and is working from home in [place] and planning to move to [place].   Past medication  trials: Sertraline-helpful for depression and anxiety Wellbutrin Lexapro-ineffective Cymbalta Pristiq Seroquel- does not recall any significant improvement Latuda Abilify Propranolol Melatonin Vyvanse Adderall XR- not effective Concerta- Notices duration only lasts about 4 hours. No significant improvement with doses less than 54 mg. Horizant Lamictal-effective for mood Trazodone-effective for insomnia but causes some excessive drowsiness Rexulti- May have caused n/v and poor concentration Adhansia- May have caused n/v and poor concentration Ativan- helpful   PHQ2-9     Office Visit from 09/03/2019 in [facility] Nutrition [facility]/10/2019 [facility] Office Visit from 05/02/2018 in [facility] [employer] Office Visit from 04/19/2017 in [facility] Nutrition from 02/20/2016 in [facility]  PHQ-2 Total Score 3 2 4 3 4   PHQ-9 Total Score 14 -- 15 16 --       Review of Systems:  Review of Systems  Medications: I have reviewed the patient's current medications.  Current Outpatient Medications  Medication Sig Dispense Refill  . albuterol (VENTOLIN HFA) 108 (90 Base) MCG/ACT inhaler Inhale 2 puffs into the lungs every 6 (six) hours as needed for wheezing or shortness of breath (Chest tightness). 18 g 0  . furosemide (LASIX) 20 MG tablet Take 1 tablet (20 mg total) by mouth 2 (two) times a week. 26 tablet 0  . Iron-FA-B Cmp-C-Biot-Probiotic (FUSION PLUS) CAPS One capsule twice daily with orange juice. Stop your Ferrous sulfate. 30  capsule 1  . levothyroxine (SYNTHROID) 100 MCG tablet Take 100 mcg by mouth daily before breakfast.    . levothyroxine (SYNTHROID) 112 MCG tablet Take 112 mcg by mouth daily before breakfast.    . LORazepam (ATIVAN) 0.5 MG tablet Take 1 tablet (0.5 mg total) by mouth every 8 (eight) hours. 30 tablet 1  .  Multiple Vitamins-Minerals (MULTIVITAMIN WITH MINERALS) tablet Take 1 tablet by mouth daily.    Marland Kitchen omeprazole (PRILOSEC) 20 MG capsule Take 2 capsules (40 mg total) by mouth daily. 180 capsule 2  . potassium chloride SA (KLOR-CON) 20 MEQ tablet Take 1 tablet (20 mEq total) by mouth 2 (two) times a week. 26 tablet 0  . propranolol (INDERAL) 20 MG tablet Take 1 tablet (20 mg total) by mouth daily. 30 tablet 3  . sertraline (ZOLOFT) 100 MG tablet Take 2 tablets (200 mg total) by mouth daily. 90 tablet 1  . topiramate (TOPAMAX) 50 MG tablet Start with 50mg (1 tab) at bedtime. In 1 week increase to 100mg (2 tabs) at bedtime. (Patient taking differently: Take 100 mg by mouth at bedtime. ) 180 tablet 6  . traZODone (DESYREL) 50 MG tablet Take 1/2-2 po QHS prn insomnia 60 tablet 1  . Vitamin D, Ergocalciferol, (DRISDOL) 1.25 MG (50000 UNIT) CAPS capsule Take 1 capsule (50,000 Units total) by mouth every 7 (seven) days. 4 capsule 0  . lamoTRIgine (LAMICTAL) 200 MG tablet Take 1 tablet (200 mg total) by mouth daily. 30 tablet 3   No current facility-administered medications for this visit.    Medication Side Effects: None  Allergies:  Allergies  Allergen Reactions  . Augmentin [Amoxicillin-Pot Clavulanate] Hives    Past Medical History:  Diagnosis Date  . Acid reflux   . ADD (attention deficit disorder)   . Anemia   . Anxiety   . Arthritis    left ankle  . Back pain   . Chest pain   . Depression   . Dyspnea   . Exercise-induced asthma   . Fatty liver   . History of blood transfusion   . History of chicken pox   . History of kidney stones   . Hypothyroidism   . Joint pain   . Lower extremity edema   . Major depressive disorder   . Migraines    chronic  . Pneumonia   . RLS (restless legs syndrome)   . Thyroid disease    hypothyroid    Family History  Problem Relation Age of Onset  . Depression Mother   . Obesity Mother   . Depression Father   . Sleep apnea Father   . Obesity  Father   . Cancer Paternal Aunt        colon  . Cancer Paternal Uncle        brain cancer  . Diabetes Maternal Grandfather   . Arthritis Maternal Grandfather   . Arthritis Maternal Grandmother   . Breast cancer Maternal Grandmother   . Arthritis Paternal Grandmother   . Arthritis Paternal Grandfather   . Migraines Neg Hx     Social History   Socioeconomic History  . Marital status: Single    Spouse name: Not on file  . Number of children: 0  . Years of education: 80  . Highest education level: Not on file  Occupational History  . Occupation:  Tobacco Use  . Smoking status: Never Smoker  . Smokeless tobacco: Never Used  Vaping Use  . Vaping Use: Never used  Substance and Sexual Activity  . Alcohol use: Not Currently    Alcohol/week: 0.0 standard drinks  . Drug use: No  . Sexual activity: Never    Comment: never sexually active  Other Topics Concern  . Not on file  Social History Narrative   Unemployed   Lives alone --update 11/11/19   Seeking work   Only child   Dog 3   Lives in a one story house with father, has a cat-12/26/16-sjb      Caffeine: 2-4 cups/day   Social Determinants of Health   Financial Resource Strain:   . Difficulty of Paying Living Expenses: Not on file  Food Insecurity:   . Worried About Programme researcher, broadcasting/film/videounning Out of Food in the Last Year: Not on file  . Ran Out of Food in the Last Year: Not on file  Transportation Needs:   . Lack of Transportation (Medical): Not on file  . Lack of Transportation (Non-Medical): Not on file  Physical Activity:   . Days of Exercise per Week: Not on file  . Minutes of Exercise per Session: Not on file  Stress:   . Feeling of Stress : Not on file  Social Connections:   . Frequency of Communication with Friends and Family: Not on file  . Frequency of Social Gatherings with Friends and Family: Not on file  . Attends Religious Services: Not on file  . Active Member of Clubs or Organizations: Not on  file  . Attends BankerClub or Organization Meetings: Not on file  . Marital Status: Not on file  Intimate Partner Violence:   . Fear of Current or Ex-Partner: Not on file  . Emotionally Abused: Not on file  . Physically Abused: Not on file  . Sexually Abused: Not on file    Past Medical History, Surgical history, Social history, and Family history were reviewed and updated as appropriate.   Please see review of systems for further details on the patient's review from today.   Objective:   Physical Exam:  There were no vitals taken for this visit.  Physical Exam Constitutional:      General: She is not in acute distress. Musculoskeletal:        General: No deformity.  Neurological:     Mental Status: She is alert and oriented to person, place, and time.     Coordination: Coordination normal.  Psychiatric:        Attention and Perception: Attention and perception normal. She does not perceive auditory or visual hallucinations.        Mood and Affect: Mood normal. Mood is not anxious or depressed. Affect is not labile, blunt, angry or inappropriate.        Speech: Speech normal.        Behavior: Behavior normal.        Thought Content: Thought content normal. Thought content is not paranoid or delusional. Thought content does not include homicidal or suicidal ideation. Thought content does not include homicidal or suicidal plan.        Cognition and Memory: Cognition and memory normal.        Judgment: Judgment normal.     Comments: Insight intact     Lab Review:     Component Value Date/Time   NA 139 09/30/2019 1047   K 3.9 09/30/2019 1047   CL 107 09/30/2019 1047   CO2 25 09/30/2019 1047   GLUCOSE 108 (H) 09/30/2019 1047   BUN 8 09/30/2019 1047   CREATININE 0.74 09/30/2019 1047  CREATININE 0.75 07/06/2016 1534   CALCIUM 8.5 (L) 09/30/2019 1047   PROT 6.8 09/30/2019 1047   ALBUMIN 3.9 09/30/2019 1047   AST 27 09/30/2019 1047   ALT 14 09/30/2019 1047   ALKPHOS 69  09/30/2019 1047   BILITOT 0.4 09/30/2019 1047   GFRNONAA >60 09/30/2019 1047   GFRAA >60 09/30/2019 1047       Component Value Date/Time   WBC 9.3 01/25/2020 1319   WBC 10.3 08/12/2019 1122   RBC 4.98 01/25/2020 1319   RBC 5.01 01/25/2020 1319   HGB 14.5 01/25/2020 1319   HGB 9.3 (L) 08/26/2019 1045   HCT 43.3 01/25/2020 1319   HCT 33.4 (L) 08/26/2019 1045   PLT 194 01/25/2020 1319   PLT 234 08/26/2019 1045   MCV 86.9 01/25/2020 1319   MCV 72 (L) 08/26/2019 1045   MCH 29.1 01/25/2020 1319   MCHC 33.5 01/25/2020 1319   RDW 14.3 01/25/2020 1319   RDW 15.4 08/26/2019 1045   LYMPHSABS 2.1 01/25/2020 1319   MONOABS 0.5 01/25/2020 1319   EOSABS 0.3 01/25/2020 1319   BASOSABS 0.1 01/25/2020 1319    No results found for: POCLITH, LITHIUM   No results found for: PHENYTOIN, PHENOBARB, VALPROATE, CBMZ   .res Assessment: Plan:   Will continue current plan of care since target signs and symptoms are well controlled without any tolerability issues. Continue Sertraline 200 mg po qd for depression and anxiety.  Continue Lamictal 200 mg po qd for mood s/s.  Continue Trazodone as needed for insomnia. Continue Ativan prn severe anxiety.  Pt to follow-up in 2-3 months or sooner if clinically indicated.  Patient advised to contact office with any questions, adverse effects, or acute worsening in signs and symptoms.  Sayward was seen today for follow-up.  Diagnoses and all orders for this visit:  Major depression, recurrent, chronic (HCC) -     lamoTRIgine (LAMICTAL) 200 MG tablet; Take 1 tablet (200 mg total) by mouth daily. -     sertraline (ZOLOFT) 100 MG tablet; Take 2 tablets (200 mg total) by mouth daily.  Social phobia -     sertraline (ZOLOFT) 100 MG tablet; Take 2 tablets (200 mg total) by mouth daily.  Attention deficit hyperactivity disorder (ADHD), predominantly inattentive type     Please see After Visit Summary for patient specific instructions.  Future  Appointments  Date Time Provider Department Center  02/23/2020  4:00 PM Romualdo Bolk, MD [facility] None  03/04/2020 11:00 AM Thomasene Ripple, DO [facility] None  03/14/2020  3:00 PM [facility] 2 [facility]  03/23/2020  2:00 PM Ellamae Sia, DO [facility] None  04/21/2020 11:00 AM Corie Chiquito, PMHNP [facility] None  04/26/2020  1:15 PM [facility] None  04/26/2020  1:45 PM [place], Brand Males, NP [facility] None  08/17/2020  3:00 PM Butch Penny, NP [facility] None    No orders of the defined types were placed in this encounter.   -------------------------------

## 2020-02-22 NOTE — Progress Notes (Signed)
Referral by Dr. Lucia Gaskins, who sees patient for chronic migraines, papilledema, and who has raised question of sleep apnea:  1. Severe Obstructive Sleep Apnea (OSA) with AHI 48.9/h. further exacerbated in REM sleep to an AHI of 65/h. 2. No Periodic Limb Movement Disorder (PLMD) 3. Soft Snoring   RECOMMENDATIONS:  1. I advise full night, attended, CPAP titration study to optimize therapy, but this patient may not qualify for a return.   2. If insurance will not allow return to attended titration, order auto CPAP 6-16 cm water pressure. Mask of choice (patient's choice after fitting in reclined position) and heated humidity.

## 2020-02-22 NOTE — Addendum Note (Signed)
Addended by: Melvyn Novas on: 02/22/2020 05:45 PM   Modules accepted: Orders

## 2020-02-22 NOTE — Procedures (Signed)
PATIENT'S NAME:  Margaret Golden, Margaret Golden DOB:      March 23, 1991      MR#:    [id number]     DATE OF RECORDING: 02/10/2020 REFERRING M.D.:  Orlene Plum, MD / Naomie Dean, MD  Study Performed:   Baseline Polysomnogram HISTORY: Referral by Dr. Lucia Gaskins, who she sees for chronic migraines, and who has raised question of sleep apnea:    I have the pleasure of seeing [name] today, a right -handed White or Caucasian female patient of Dr Lucia Gaskins, MD. She has morbid obesity and a medical history of Acid reflux, ADD (attention deficit disorder), Anemia, Anxiety, Arthritis, Back pain, Chest pain, Depression, Dyspnea, Exercise-induced asthma, Fatty liver, History of blood transfusion, History of chicken pox, History of kidney stones, Hypothyroidism, Joint pain, Lower extremity edema, Major depressive disorder, Migraines, Pneumonia, RLS (restless legs syndrome), and Thyroid disease.  Bronchitis - not COVID related. Fully vaccinated.    The patient endorsed the Epworth Sleepiness Scale at 6/24 points.   The patient's weight 364 pounds with a height of 67 (inches), resulting in a BMI of 57.1 kg/m2. The patient's neck circumference measured 19 inches.  CURRENT MEDICATIONS: Ventolin, Lasix, Fusin Plus caps, Synthroid, Ativan, Multivitamin with minerals, Prilosec, Klor-Con, Inderal, Maxalt-MLT, Zoloft, Topamax, Vit D   PROCEDURE:  This is a multichannel digital polysomnogram utilizing the Somnostar 11.2 system.  Electrodes and sensors were applied and monitored per AASM Specifications.   EEG, EOG, Chin and Limb EMG, were sampled at 200 Hz.  ECG, Snore and Nasal Pressure, Thermal Airflow, Respiratory Effort, CPAP Flow and Pressure, Oximetry was sampled at 50 Hz. Digital video and audio were recorded.      BASELINE STUDY: Lights Out was at 22:42 and Lights On at 04:55.  Total recording time (TRT) was 373 minutes, with a total sleep time (TST) of 251.5 minutes.  The patient's sleep latency was 18 minutes.  REM latency  was 300 minutes.  The sleep efficiency was 67.4 %.     SLEEP ARCHITECTURE: WASO (Wake after sleep onset) was 103 minutes.  There were 52 minutes in Stage N1, 162 minutes Stage N2, 19 minutes Stage N3 and 18.5 minutes in Stage REM.  The percentage of Stage N1 was 20.7%, Stage N2 was 64.4%, Stage N3 was 7.6% and Stage R (REM sleep) was 7.4%.   RESPIRATORY ANALYSIS:  There were a total of 205 respiratory events:  0 obstructive apneas, 0 central apneas and 0 mixed apneas with a total of 0 apneas and an apnea index (AI) of 0 /hour. There were 205 hypopneas with a hypopnea index of 48.9 /hour.     The total APNEA/HYPOPNEA INDEX (AHI) was 48.9/hour.  20 events occurred in REM sleep and 370 events in NREM. The REM AHI was 64.9 /hour, versus a non-REM AHI of 47.6. The patient spent 8 minutes of total sleep time in the supine position and 244 minutes in non-supine. The supine AHI was 15.0/h versus a non-supine AHI of 50.0/h.  OXYGEN SATURATION & C02:  The Wake baseline 02 saturation was 97%, with the lowest being 87%. Time spent below 89% saturation equaled 2 minutes.  The arousals were noted as: 73 were spontaneous, 0 were associated with PLMs, 121 were associated with respiratory events. The patient had a total of 0 Periodic Limb Movements  Audio and video analysis did not show any abnormal or unusual movements, behaviors, phonations or vocalizations.  No bathroom breaks. Soft Snoring was noted. EKG was in keeping with normal sinus rhythm (  NSR). Post-study, the patient indicated that sleep was the same as usual.    IMPRESSION:  1. Severe Obstructive Sleep Apnea (OSA) with AHI 48.9/h. further exacerbated in REM sleep to an AHI of 65/h. 2. No Periodic Limb Movement Disorder (PLMD) 3. Soft Snoring   RECOMMENDATIONS:  1. Advise full night, attended, CPAP titration study to optimize therapy.   2. If insurance will not allow return to attended titration, order auto CPAP 6-16 cm water pressure. Mask of  choice (patient's choice after fitting in reclined position) and heated humidity.     I certify that I have reviewed the entire raw data recording prior to the issuance of this report in accordance with the Standards of Accreditation of the American Academy of Sleep Medicine (AASM)   Melvyn Novas, MD Diplomat, American Board of Psychiatry and Neurology  Diplomat, American Board of Sleep Medicine Wellsite geologist, [place] [facility] [employer]

## 2020-02-23 ENCOUNTER — Encounter: Payer: Self-pay | Admitting: Obstetrics and Gynecology

## 2020-02-23 ENCOUNTER — Ambulatory Visit (INDEPENDENT_AMBULATORY_CARE_PROVIDER_SITE_OTHER): Payer: BC Managed Care – PPO | Admitting: Obstetrics and Gynecology

## 2020-02-23 ENCOUNTER — Telehealth: Payer: Self-pay | Admitting: Neurology

## 2020-02-23 ENCOUNTER — Other Ambulatory Visit: Payer: Self-pay

## 2020-02-23 VITALS — BP 124/80 | HR 116 | Ht 67.0 in | Wt 371.0 lb

## 2020-02-23 DIAGNOSIS — N857 Hematometra: Secondary | ICD-10-CM

## 2020-02-23 DIAGNOSIS — Z6841 Body Mass Index (BMI) 40.0 and over, adult: Secondary | ICD-10-CM | POA: Insufficient documentation

## 2020-02-23 DIAGNOSIS — Z862 Personal history of diseases of the blood and blood-forming organs and certain disorders involving the immune mechanism: Secondary | ICD-10-CM

## 2020-02-23 DIAGNOSIS — N920 Excessive and frequent menstruation with regular cycle: Secondary | ICD-10-CM | POA: Diagnosis not present

## 2020-02-23 DIAGNOSIS — F4323 Adjustment disorder with mixed anxiety and depressed mood: Secondary | ICD-10-CM | POA: Diagnosis not present

## 2020-02-23 DIAGNOSIS — G4733 Obstructive sleep apnea (adult) (pediatric): Secondary | ICD-10-CM | POA: Insufficient documentation

## 2020-02-23 DIAGNOSIS — G4730 Sleep apnea, unspecified: Secondary | ICD-10-CM

## 2020-02-23 NOTE — H&P (View-Only) (Signed)
GYNECOLOGY  VISIT   HPI: 29 y.o.   Single White or Caucasian Not Hispanic or Latino  female   G0P0000 with Patient's last menstrual period was 02/09/2020.   here for heavy periods. She says that she has no desire to have children. She has never wanted to have children. She would like to discuss surgical options and other options other than and IUD.    Menarche age 60. Cycles are typically every 20-30 days, occasionally shorter. Normally bleeds for 4 day. Wears an overnight pad, saturates it in a couple of hours. She has been anemic for years. She has had ~1 iron transfusion a month for the last year. She needed a blood transfusion earlier this year.  Last Hgb in 10/21 was normal.  She was supposed to have a hysteroscopy, D&C, IUD insertion last summer, but surgery was canceled because she was having some chest pain and tachycardia. She has had Cardiology clearance since then.  Currently being evaluated for possible sarcoidosis vs lymphoma. She has some SOB, worse with exertion and speaking.   Never sexually active.   She is working on weight loss. She will get back in touch with the weight loss clinic.   She previously was on OCP's, still had her cycles, but they were lighter. She doesn't want to be on OCP's.  GYNECOLOGIC HISTORY: Patient's last menstrual period was 02/09/2020. Contraception: none  Menopausal hormone therapy: none         OB History    Gravida  0   Para  0   Term  0   Preterm  0   AB  0   Living  0     SAB  0   TAB  0   Ectopic  0   Multiple  0   Live Births  0              Patient Active Problem List   Diagnosis Date Noted  . Mediastinal lymphadenopathy 12/23/2019  . Pulmonary nodules 12/23/2019  . RLS (restless legs syndrome) 12/21/2019  . Super obesity 12/21/2019  . Papilledema 12/21/2019  . Obesity with alveolar hypoventilation and body mass index (BMI) of 40 or greater (HCC) 12/21/2019  . Morning headache 12/21/2019  . NSVT  (nonsustained ventricular tachycardia) (HCC) 12/09/2019  . Mixed hyperlipidemia 12/09/2019  . Palpitations 12/09/2019  . IDA (iron deficiency anemia) 10/20/2019  . Hematometra 07/15/2019  . Menorrhagia with regular cycle 07/15/2019  . Symptomatic anemia 04/07/2019  . Social phobia 02/26/2018  . Major depressive disorder, recurrent episode, mild (HCC) 02/26/2018  . Insomnia 02/26/2018  . Left ankle pain 02/28/2017  . Class 3 severe obesity due to excess calories with serious comorbidity in adult (HCC) 05/11/2016  . Fatty liver disease, nonalcoholic 02/02/2015  . Depression 01/03/2015  . Hypothyroidism 01/03/2015  . GERD (gastroesophageal reflux disease) 01/03/2015  . Migraines 01/03/2015  . Essential hypertension 06/02/2014  No aura's  Past Medical History:  Diagnosis Date  . Acid reflux   . ADD (attention deficit disorder)   . Anemia   . Anxiety   . Arthritis    left ankle  . Back pain   . Chest pain   . Depression   . Dyspnea   . Exercise-induced asthma   . Fatty liver   . History of blood transfusion   . History of chicken pox   . History of kidney stones   . Hypothyroidism   . Joint pain   . Lower extremity edema   .  Major depressive disorder   . Migraines    chronic  . Pneumonia   . RLS (restless legs syndrome)   . Thyroid disease    hypothyroid    Past Surgical History:  Procedure Laterality Date  . KNEE ARTHROSCOPY Right 2007  . TONSILLECTOMY  2010  . WISDOM TOOTH EXTRACTION      Current Outpatient Medications  Medication Sig Dispense Refill  . albuterol (VENTOLIN HFA) 108 (90 Base) MCG/ACT inhaler Inhale 2 puffs into the lungs every 6 (six) hours as needed for wheezing or shortness of breath (Chest tightness). 18 g 0  . furosemide (LASIX) 20 MG tablet Take 1 tablet (20 mg total) by mouth 2 (two) times a week. 26 tablet 0  . Iron-FA-B Cmp-C-Biot-Probiotic (FUSION PLUS) CAPS One capsule twice daily with orange juice. Stop your Ferrous sulfate. 30  capsule 1  . lamoTRIgine (LAMICTAL) 200 MG tablet Take 1 tablet (200 mg total) by mouth daily. 30 tablet 3  . levothyroxine (SYNTHROID) 100 MCG tablet Take 100 mcg by mouth daily before breakfast.    . levothyroxine (SYNTHROID) 112 MCG tablet Take 112 mcg by mouth daily before breakfast.    . LORazepam (ATIVAN) 0.5 MG tablet Take 1 tablet (0.5 mg total) by mouth every 8 (eight) hours. 30 tablet 1  . Multiple Vitamins-Minerals (MULTIVITAMIN WITH MINERALS) tablet Take 1 tablet by mouth daily.    Marland Kitchen omeprazole (PRILOSEC) 20 MG capsule Take 2 capsules (40 mg total) by mouth daily. 180 capsule 2  . potassium chloride SA (KLOR-CON) 20 MEQ tablet Take 1 tablet (20 mEq total) by mouth 2 (two) times a week. 26 tablet 0  . propranolol (INDERAL) 20 MG tablet Take 1 tablet (20 mg total) by mouth daily. 30 tablet 3  . sertraline (ZOLOFT) 100 MG tablet Take 2 tablets (200 mg total) by mouth daily. 90 tablet 1  . topiramate (TOPAMAX) 50 MG tablet Start with 50mg (1 tab) at bedtime. In 1 week increase to 100mg (2 tabs) at bedtime. (Patient taking differently: Take 100 mg by mouth at bedtime. ) 180 tablet 6  . traZODone (DESYREL) 50 MG tablet Take 1/2-2 po QHS prn insomnia 60 tablet 1  . Vitamin D, Ergocalciferol, (DRISDOL) 1.25 MG (50000 UNIT) CAPS capsule Take 1 capsule (50,000 Units total) by mouth every 7 (seven) days. 4 capsule 0   No current facility-administered medications for this visit.     ALLERGIES: Augmentin [amoxicillin-pot clavulanate]  Family History  Problem Relation Age of Onset  . Depression Mother   . Obesity Mother   . Depression Father   . Sleep apnea Father   . Obesity Father   . Cancer Paternal Aunt        colon  . Cancer Paternal Uncle        brain cancer  . Diabetes Maternal Grandfather   . Arthritis Maternal Grandfather   . Arthritis Maternal Grandmother   . Breast cancer Maternal Grandmother   . Arthritis Paternal Grandmother   . Arthritis Paternal Grandfather   .  Migraines Neg Hx     Social History   Socioeconomic History  . Marital status: Single    Spouse name: Not on file  . Number of children: 0  . Years of education: 74  . Highest education level: Not on file  Occupational History  . Occupation:  Tobacco Use  . Smoking status: Never Smoker  . Smokeless tobacco: Never Used  Vaping Use  . Vaping Use: Never used  Substance and Sexual  Activity  . Alcohol use: Not Currently    Alcohol/week: 0.0 standard drinks  . Drug use: No  . Sexual activity: Never    Comment: never sexually active  Other Topics Concern  . Not on file  Social History Narrative   Unemployed   Lives alone --update 11/11/19   Seeking work   Only child   Dog 3   Lives in a one story house with father, has a cat-12/26/16-sjb      Caffeine: 2-4 cups/day   Social Determinants of Health   Financial Resource Strain:   . Difficulty of Paying Living Expenses: Not on file  Food Insecurity:   . Worried About Programme researcher, broadcasting/film/video in the Last Year: Not on file  . Ran Out of Food in the Last Year: Not on file  Transportation Needs:   . Lack of Transportation (Medical): Not on file  . Lack of Transportation (Non-Medical): Not on file  Physical Activity:   . Days of Exercise per Week: Not on file  . Minutes of Exercise per Session: Not on file  Stress:   . Feeling of Stress : Not on file  Social Connections:   . Frequency of Communication with Friends and Family: Not on file  . Frequency of Social Gatherings with Friends and Family: Not on file  . Attends Religious Services: Not on file  . Active Member of Clubs or Organizations: Not on file  . Attends Banker Meetings: Not on file  . Marital Status: Not on file  Intimate Partner Violence:   . Fear of Current or Ex-Partner: Not on file  . Emotionally Abused: Not on file  . Physically Abused: Not on file  . Sexually Abused: Not on file    Review of Systems  All other systems  reviewed and are negative.   PHYSICAL EXAMINATION:    BP 124/80   Pulse (!) 116   Ht 5\' 7"  (1.702 m)   Wt (!) 371 lb (168.3 kg)   LMP 02/09/2020   SpO2 100%   BMI 58.11 kg/m     General appearance: alert, cooperative and appears stated age Neck: no adenopathy, supple, symmetrical, trachea midline and thyroid normal to inspection and palpation Heart: regular rate and rhythm Lungs: CTAB Abdomen: soft, non-tender; bowel sounds normal; no masses,  no organomegaly Extremities: normal, atraumatic, no cyanosis Skin: normal color, texture and turgor, no rashes or lesions Lymph: normal cervical supraclavicular and inguinal nodes Neurologic: grossly normal   Chaperone was present for exam.  ASSESSMENT Menorrhagia leading to anemia. Ultrasound in 4/21 reports hematometra, not clear if she was on her cycle at the time. Otherwise report was normal.  BMI 58 Sleep apnea Enlarged lymph nodes noted on CT for cardiac testing, being monitored by Pulmonology for possible sarcoidosis or lymphoma. Will have f/u CT in 12/21 Negative cardiac evaluation.     PLAN Discussed options for treating her menorrhagia. I wouldn't recommend depo-provera, endometrial ablation or hysterectomy.  She declines birth control pills I do think the mirnea IUD would be a good choice for her Will set her up for hysteroscopy, D&C, mirena IUD insertion. Given the possible hematometria seen in 4/21 will plan intraoperative ultrasound, may need guidance for D&C.   Over 30 minutes in total patient care, including chart review.

## 2020-02-23 NOTE — Telephone Encounter (Signed)
-----   Message from Melvyn Novas, MD sent at 02/22/2020  5:45 PM EST ----- Referral by Dr. Lucia Gaskins, who sees patient for chronic migraines, papilledema, and who has raised question of sleep apnea:  1. Severe Obstructive Sleep Apnea (OSA) with AHI 48.9/h. further exacerbated in REM sleep to an AHI of 65/h. 2. No Periodic Limb Movement Disorder (PLMD) 3. Soft Snoring   RECOMMENDATIONS:  1. I advise full night, attended, CPAP titration study to optimize therapy, but this patient may not qualify for a return.   2. If insurance will not allow return to attended titration, order auto CPAP 6-16 cm water pressure. Mask of choice (patient's choice after fitting in reclined position) and heated humidity.

## 2020-02-23 NOTE — Progress Notes (Signed)
GYNECOLOGY  VISIT   HPI: 29 y.o.   Single White or Caucasian Not Hispanic or Latino  female   G0P0000 with Patient's last menstrual period was 02/09/2020.   here for heavy periods. She says that she has no desire to have children. She has never wanted to have children. She would like to discuss surgical options and other options other than and IUD.    Menarche age 60. Cycles are typically every 20-30 days, occasionally shorter. Normally bleeds for 4 day. Wears an overnight pad, saturates it in a couple of hours. She has been anemic for years. She has had ~1 iron transfusion a month for the last year. She needed a blood transfusion earlier this year.  Last Hgb in 10/21 was normal.  She was supposed to have a hysteroscopy, D&C, IUD insertion last summer, but surgery was canceled because she was having some chest pain and tachycardia. She has had Cardiology clearance since then.  Currently being evaluated for possible sarcoidosis vs lymphoma. She has some SOB, worse with exertion and speaking.   Never sexually active.   She is working on weight loss. She will get back in touch with the weight loss clinic.   She previously was on OCP's, still had her cycles, but they were lighter. She doesn't want to be on OCP's.  GYNECOLOGIC HISTORY: Patient's last menstrual period was 02/09/2020. Contraception: none  Menopausal hormone therapy: none         OB History    Gravida  0   Para  0   Term  0   Preterm  0   AB  0   Living  0     SAB  0   TAB  0   Ectopic  0   Multiple  0   Live Births  0              Patient Active Problem List   Diagnosis Date Noted  . Mediastinal lymphadenopathy 12/23/2019  . Pulmonary nodules 12/23/2019  . RLS (restless legs syndrome) 12/21/2019  . Super obesity 12/21/2019  . Papilledema 12/21/2019  . Obesity with alveolar hypoventilation and body mass index (BMI) of 40 or greater (HCC) 12/21/2019  . Morning headache 12/21/2019  . NSVT  (nonsustained ventricular tachycardia) (HCC) 12/09/2019  . Mixed hyperlipidemia 12/09/2019  . Palpitations 12/09/2019  . IDA (iron deficiency anemia) 10/20/2019  . Hematometra 07/15/2019  . Menorrhagia with regular cycle 07/15/2019  . Symptomatic anemia 04/07/2019  . Social phobia 02/26/2018  . Major depressive disorder, recurrent episode, mild (HCC) 02/26/2018  . Insomnia 02/26/2018  . Left ankle pain 02/28/2017  . Class 3 severe obesity due to excess calories with serious comorbidity in adult (HCC) 05/11/2016  . Fatty liver disease, nonalcoholic 02/02/2015  . Depression 01/03/2015  . Hypothyroidism 01/03/2015  . GERD (gastroesophageal reflux disease) 01/03/2015  . Migraines 01/03/2015  . Essential hypertension 06/02/2014  No aura's  Past Medical History:  Diagnosis Date  . Acid reflux   . ADD (attention deficit disorder)   . Anemia   . Anxiety   . Arthritis    left ankle  . Back pain   . Chest pain   . Depression   . Dyspnea   . Exercise-induced asthma   . Fatty liver   . History of blood transfusion   . History of chicken pox   . History of kidney stones   . Hypothyroidism   . Joint pain   . Lower extremity edema   .  Major depressive disorder   . Migraines    chronic  . Pneumonia   . RLS (restless legs syndrome)   . Thyroid disease    hypothyroid    Past Surgical History:  Procedure Laterality Date  . KNEE ARTHROSCOPY Right 2007  . TONSILLECTOMY  2010  . WISDOM TOOTH EXTRACTION      Current Outpatient Medications  Medication Sig Dispense Refill  . albuterol (VENTOLIN HFA) 108 (90 Base) MCG/ACT inhaler Inhale 2 puffs into the lungs every 6 (six) hours as needed for wheezing or shortness of breath (Chest tightness). 18 g 0  . furosemide (LASIX) 20 MG tablet Take 1 tablet (20 mg total) by mouth 2 (two) times a week. 26 tablet 0  . Iron-FA-B Cmp-C-Biot-Probiotic (FUSION PLUS) CAPS One capsule twice daily with orange juice. Stop your Ferrous sulfate. 30  capsule 1  . lamoTRIgine (LAMICTAL) 200 MG tablet Take 1 tablet (200 mg total) by mouth daily. 30 tablet 3  . levothyroxine (SYNTHROID) 100 MCG tablet Take 100 mcg by mouth daily before breakfast.    . levothyroxine (SYNTHROID) 112 MCG tablet Take 112 mcg by mouth daily before breakfast.    . LORazepam (ATIVAN) 0.5 MG tablet Take 1 tablet (0.5 mg total) by mouth every 8 (eight) hours. 30 tablet 1  . Multiple Vitamins-Minerals (MULTIVITAMIN WITH MINERALS) tablet Take 1 tablet by mouth daily.    Marland Kitchen omeprazole (PRILOSEC) 20 MG capsule Take 2 capsules (40 mg total) by mouth daily. 180 capsule 2  . potassium chloride SA (KLOR-CON) 20 MEQ tablet Take 1 tablet (20 mEq total) by mouth 2 (two) times a week. 26 tablet 0  . propranolol (INDERAL) 20 MG tablet Take 1 tablet (20 mg total) by mouth daily. 30 tablet 3  . sertraline (ZOLOFT) 100 MG tablet Take 2 tablets (200 mg total) by mouth daily. 90 tablet 1  . topiramate (TOPAMAX) 50 MG tablet Start with 50mg (1 tab) at bedtime. In 1 week increase to 100mg (2 tabs) at bedtime. (Patient taking differently: Take 100 mg by mouth at bedtime. ) 180 tablet 6  . traZODone (DESYREL) 50 MG tablet Take 1/2-2 po QHS prn insomnia 60 tablet 1  . Vitamin D, Ergocalciferol, (DRISDOL) 1.25 MG (50000 UNIT) CAPS capsule Take 1 capsule (50,000 Units total) by mouth every 7 (seven) days. 4 capsule 0   No current facility-administered medications for this visit.     ALLERGIES: Augmentin [amoxicillin-pot clavulanate]  Family History  Problem Relation Age of Onset  . Depression Mother   . Obesity Mother   . Depression Father   . Sleep apnea Father   . Obesity Father   . Cancer Paternal Aunt        colon  . Cancer Paternal Uncle        brain cancer  . Diabetes Maternal Grandfather   . Arthritis Maternal Grandfather   . Arthritis Maternal Grandmother   . Breast cancer Maternal Grandmother   . Arthritis Paternal Grandmother   . Arthritis Paternal Grandfather   .  Migraines Neg Hx     Social History   Socioeconomic History  . Marital status: Single    Spouse name: Not on file  . Number of children: 0  . Years of education: 74  . Highest education level: Not on file  Occupational History  . Occupation:  Tobacco Use  . Smoking status: Never Smoker  . Smokeless tobacco: Never Used  Vaping Use  . Vaping Use: Never used  Substance and Sexual  Activity  . Alcohol use: Not Currently    Alcohol/week: 0.0 standard drinks  . Drug use: No  . Sexual activity: Never    Comment: never sexually active  Other Topics Concern  . Not on file  Social History Narrative   Unemployed   Lives alone --update 11/11/19   Seeking work   Only child   Dog 3   Lives in a one story house with father, has a cat-12/26/16-sjb      Caffeine: 2-4 cups/day   Social Determinants of Health   Financial Resource Strain:   . Difficulty of Paying Living Expenses: Not on file  Food Insecurity:   . Worried About Programme researcher, broadcasting/film/video in the Last Year: Not on file  . Ran Out of Food in the Last Year: Not on file  Transportation Needs:   . Lack of Transportation (Medical): Not on file  . Lack of Transportation (Non-Medical): Not on file  Physical Activity:   . Days of Exercise per Week: Not on file  . Minutes of Exercise per Session: Not on file  Stress:   . Feeling of Stress : Not on file  Social Connections:   . Frequency of Communication with Friends and Family: Not on file  . Frequency of Social Gatherings with Friends and Family: Not on file  . Attends Religious Services: Not on file  . Active Member of Clubs or Organizations: Not on file  . Attends Banker Meetings: Not on file  . Marital Status: Not on file  Intimate Partner Violence:   . Fear of Current or Ex-Partner: Not on file  . Emotionally Abused: Not on file  . Physically Abused: Not on file  . Sexually Abused: Not on file    Review of Systems  All other systems  reviewed and are negative.   PHYSICAL EXAMINATION:    BP 124/80   Pulse (!) 116   Ht 5\' 7"  (1.702 m)   Wt (!) 371 lb (168.3 kg)   LMP 02/09/2020   SpO2 100%   BMI 58.11 kg/m     General appearance: alert, cooperative and appears stated age Neck: no adenopathy, supple, symmetrical, trachea midline and thyroid normal to inspection and palpation Heart: regular rate and rhythm Lungs: CTAB Abdomen: soft, non-tender; bowel sounds normal; no masses,  no organomegaly Extremities: normal, atraumatic, no cyanosis Skin: normal color, texture and turgor, no rashes or lesions Lymph: normal cervical supraclavicular and inguinal nodes Neurologic: grossly normal   Chaperone was present for exam.  ASSESSMENT Menorrhagia leading to anemia. Ultrasound in 4/21 reports hematometra, not clear if she was on her cycle at the time. Otherwise report was normal.  BMI 58 Sleep apnea Enlarged lymph nodes noted on CT for cardiac testing, being monitored by Pulmonology for possible sarcoidosis or lymphoma. Will have f/u CT in 12/21 Negative cardiac evaluation.     PLAN Discussed options for treating her menorrhagia. I wouldn't recommend depo-provera, endometrial ablation or hysterectomy.  She declines birth control pills I do think the mirnea IUD would be a good choice for her Will set her up for hysteroscopy, D&C, mirena IUD insertion. Given the possible hematometria seen in 4/21 will plan intraoperative ultrasound, may need guidance for D&C.   Over 30 minutes in total patient care, including chart review.

## 2020-02-23 NOTE — Telephone Encounter (Signed)

## 2020-02-29 ENCOUNTER — Telehealth: Payer: Self-pay | Admitting: Pulmonary Disease

## 2020-02-29 ENCOUNTER — Telehealth: Payer: Self-pay | Admitting: Cardiology

## 2020-02-29 ENCOUNTER — Telehealth: Payer: Self-pay

## 2020-02-29 NOTE — Telephone Encounter (Signed)
Call placed [facility], [phone].  Spoke with Selena Batten, requested surgical clearance for hysteroscopy, D&C with Mirena IUD insertion for 03/22/20.  Was advised message will be forwarded for return call.    Call placed to [facility] at [phone].  Spoke with Morrie Sheldon. Requested surgical clearance for hysteroscopy, D&C with Mirena IUD insertion for 03/22/20. Was advised information will be forwarded to pre-op team for review. Was advised patient is scheduled for an OV on 03/04/20.

## 2020-02-29 NOTE — Telephone Encounter (Signed)
   [facility] Pre-operative Risk Assessment    HEARTCARE STAFF: - Please ensure there is not already an duplicate clearance open for this procedure. - Under Visit Info/Reason for Call, type in Other and utilize the format Clearance MM/DD/YY or Clearance TBD. Do not use dashes or single digits. - If request is for dental extraction, please clarify the # of teeth to be extracted.  Request for surgical clearance:  1. What type of surgery is being performed? Hysteroscopy D&C with Mirena IUD insertion   2. When is this surgery scheduled? 03/22/20  3. What type of clearance is required (medical clearance vs. Pharmacy clearance to hold med vs. Both)? Medical   4. Are there any medications that need to be held prior to surgery and how long? None they are aware of TBD by Cardiologist   5. Practice name and name of physician performing surgery? [facility], Dr. Sumner Boast   6. What is the office phone number? [phone]   7.   What is the office fax number? [phone]  8.   Anesthesia type (None, local, MAC, general) ? General    Trilby Drummer 02/29/2020, 4:04 PM  _________________________________________________________________   (provider comments below)

## 2020-02-29 NOTE — Telephone Encounter (Signed)
Spoke with patient regarding surgery benefits. Patient acknowledges understanding of information presented. Patient is aware that benefits presented are for professional benefits only. Patient is aware that once surgery is scheduled, the hospital will call with separate benefits. Patient is aware of surgery cancellation policy.  Patient is ready to proceed with scheduling surgery for December 21st. Patient aware that we are waiting on a cardiac clearance confirmation and then to expect a return call from Carmelina Dane, RN, regarding confirmation of surgery date/time and pre-op/post-op care.

## 2020-02-29 NOTE — Telephone Encounter (Signed)
   Primary Cardiologist: Thomasene Ripple, DO  Chart reviewed as part of pre-operative protocol coverage. Patient was contacted 02/29/2020 in reference to pre-operative risk assessment for pending surgery as outlined below.  Margaret Golden was last seen on 12/09/19 by Dr. Servando Salina for chest pain and SOB.  Her cardiac studies were unremarkable and symptoms were not felt to be cardiac.  She has seen pulmonary for further eval.  She did have NSVT and is on beta blocker.   Since that day, Margaret Golden has done well.  Therefore, based on ACC/AHA guidelines, the patient would be at acceptable risk for the planned procedure without further cardiovascular testing.   The patient was advised that if she develops new symptoms prior to surgery to contact our office to arrange for a follow-up visit, and she verbalized understanding.  I will route this recommendation to the requesting party via Epic fax function and remove from pre-op pool. Please call with questions.  Nada Boozer, NP 02/29/2020, 4:39 PM

## 2020-03-01 ENCOUNTER — Other Ambulatory Visit (HOSPITAL_COMMUNITY): Payer: Self-pay | Admitting: Obstetrics and Gynecology

## 2020-03-01 ENCOUNTER — Other Ambulatory Visit: Payer: Self-pay | Admitting: Obstetrics and Gynecology

## 2020-03-01 ENCOUNTER — Telehealth: Payer: Self-pay

## 2020-03-01 DIAGNOSIS — F419 Anxiety disorder, unspecified: Secondary | ICD-10-CM | POA: Insufficient documentation

## 2020-03-01 DIAGNOSIS — M549 Dorsalgia, unspecified: Secondary | ICD-10-CM | POA: Insufficient documentation

## 2020-03-01 DIAGNOSIS — Z87442 Personal history of urinary calculi: Secondary | ICD-10-CM | POA: Insufficient documentation

## 2020-03-01 DIAGNOSIS — F329 Major depressive disorder, single episode, unspecified: Secondary | ICD-10-CM | POA: Insufficient documentation

## 2020-03-01 DIAGNOSIS — J189 Pneumonia, unspecified organism: Secondary | ICD-10-CM | POA: Insufficient documentation

## 2020-03-01 DIAGNOSIS — R079 Chest pain, unspecified: Secondary | ICD-10-CM | POA: Insufficient documentation

## 2020-03-01 DIAGNOSIS — J45990 Exercise induced bronchospasm: Secondary | ICD-10-CM | POA: Insufficient documentation

## 2020-03-01 DIAGNOSIS — E079 Disorder of thyroid, unspecified: Secondary | ICD-10-CM | POA: Insufficient documentation

## 2020-03-01 DIAGNOSIS — Z9289 Personal history of other medical treatment: Secondary | ICD-10-CM | POA: Insufficient documentation

## 2020-03-01 DIAGNOSIS — F988 Other specified behavioral and emotional disorders with onset usually occurring in childhood and adolescence: Secondary | ICD-10-CM | POA: Insufficient documentation

## 2020-03-01 DIAGNOSIS — N920 Excessive and frequent menstruation with regular cycle: Secondary | ICD-10-CM

## 2020-03-01 DIAGNOSIS — Z8619 Personal history of other infectious and parasitic diseases: Secondary | ICD-10-CM | POA: Insufficient documentation

## 2020-03-01 DIAGNOSIS — D649 Anemia, unspecified: Secondary | ICD-10-CM | POA: Insufficient documentation

## 2020-03-01 DIAGNOSIS — K760 Fatty (change of) liver, not elsewhere classified: Secondary | ICD-10-CM | POA: Insufficient documentation

## 2020-03-01 DIAGNOSIS — R600 Localized edema: Secondary | ICD-10-CM | POA: Insufficient documentation

## 2020-03-01 DIAGNOSIS — N857 Hematometra: Secondary | ICD-10-CM

## 2020-03-01 DIAGNOSIS — M2550 Pain in unspecified joint: Secondary | ICD-10-CM | POA: Insufficient documentation

## 2020-03-01 DIAGNOSIS — R0600 Dyspnea, unspecified: Secondary | ICD-10-CM | POA: Insufficient documentation

## 2020-03-01 DIAGNOSIS — K219 Gastro-esophageal reflux disease without esophagitis: Secondary | ICD-10-CM | POA: Insufficient documentation

## 2020-03-01 DIAGNOSIS — M1990 Unspecified osteoarthritis, unspecified site: Secondary | ICD-10-CM | POA: Insufficient documentation

## 2020-03-01 DIAGNOSIS — F4323 Adjustment disorder with mixed anxiety and depressed mood: Secondary | ICD-10-CM | POA: Diagnosis not present

## 2020-03-01 NOTE — Telephone Encounter (Signed)
[facility] office returned call for [facility] regarding patient. Call back number is [phone].

## 2020-03-01 NOTE — Telephone Encounter (Signed)
Cardiac Clearance fax recived from [facility], copy of report to Dr. Oscar La for review and signature.

## 2020-03-01 NOTE — Telephone Encounter (Signed)
Spoke with patient. Surgery date request confirmed.  Advised surgery is scheduled for 03/22/20 at 0730 at [facility].  Surgery instruction sheet and hospital brochure reviewed, printed copy will be mailed to address on file. Patient advised if Covid screening and quarantine requirements and agreeable.

## 2020-03-01 NOTE — Telephone Encounter (Signed)
See telephone encounter dated 02/29/20.   Encounter closed.

## 2020-03-01 NOTE — Telephone Encounter (Signed)
Lillia [name], CMA 2 minutes ago (3:24 PM)     [facility] returned call for [facility] regarding patient. Call back number is [phone].

## 2020-03-01 NOTE — Telephone Encounter (Signed)
Did they fax something over? Or just need a letter? What is the surgery? LMTCB for Margaret Golden since their office was closed for lunch when I called.

## 2020-03-02 DIAGNOSIS — F4323 Adjustment disorder with mixed anxiety and depressed mood: Secondary | ICD-10-CM | POA: Diagnosis not present

## 2020-03-02 NOTE — Telephone Encounter (Signed)
There are no forms on this patient for surgical clearance in Dr. Lanora Manis mail  [facility], office was closed. Phones turn off at 4pm

## 2020-03-02 NOTE — Telephone Encounter (Signed)
Faxing over papers for surgery clearance - Surgery is scheduled 03/22/20 - surgery is hyster//DNC//Marena insertion - clinic is on Epic - Fax [phone] - Attention Chapman Fitch

## 2020-03-02 NOTE — Telephone Encounter (Signed)
Spoke with Kenney Houseman at [facility]. Request written request for surgical clearance  to be faxed to [phone].  Request faxed.

## 2020-03-03 NOTE — Telephone Encounter (Signed)
Margaret Golden called returning missed call. States they do not have a set  form and sent over a leader head requesting surgical clearance. States a letter will be fine for surgical clearance. Can be reached at [phone] Fax [phone]

## 2020-03-03 NOTE — Telephone Encounter (Signed)
Dr. Gloriann Loan called returning missed call. States they do not have a set  form and sent over a leader head requesting surgical clearance. States a letter will be fine for surgical clearance. Can be reached at [phone] Fax [phone]  Are you ok with writing letter for clear this patient for surgery and we can fax it over to their office? Last OV was 12/18/19, CT Scan scheduled for 03/14/20 at 3pm, has not done or been scheduled for PFT.

## 2020-03-04 ENCOUNTER — Encounter: Payer: Self-pay | Admitting: Cardiology

## 2020-03-04 ENCOUNTER — Ambulatory Visit: Payer: BC Managed Care – PPO | Admitting: Cardiology

## 2020-03-04 ENCOUNTER — Other Ambulatory Visit: Payer: Self-pay

## 2020-03-04 VITALS — BP 112/88 | HR 84 | Ht 67.0 in | Wt 369.0 lb

## 2020-03-04 DIAGNOSIS — R590 Localized enlarged lymph nodes: Secondary | ICD-10-CM

## 2020-03-04 DIAGNOSIS — D649 Anemia, unspecified: Secondary | ICD-10-CM

## 2020-03-04 DIAGNOSIS — R002 Palpitations: Secondary | ICD-10-CM | POA: Diagnosis not present

## 2020-03-04 MED ORDER — PROPRANOLOL HCL 20 MG PO TABS: 20.0000 mg | ORAL_TABLET | Freq: Three times a day (TID) | ORAL | 3 refills | Status: DC

## 2020-03-04 NOTE — Telephone Encounter (Signed)
Called and spoke with Noreene Larsson who states that letter will need to be signed. Advised her that Dr. Francine Graven will be back in the office on Monday and would sign it then. She expressed understanding. Letter has been typed up and printed. Will place in Dr. Lanora Manis box for it to be signed then will need to be faxed to [phone]. Will route to Dr. Francine Graven so he is aware to look out for it

## 2020-03-04 NOTE — Telephone Encounter (Signed)
Letter can be sent stating she is low risk for post-operative respiratory complications.   I will be in clinic on Monday and can sign the letter then if necessary.   Thanks, Cletis Athens

## 2020-03-04 NOTE — Patient Instructions (Addendum)
Medication Instructions:  Your physician has recommended you make the following change in your medication:  1. INCREASE PROPANOLOL 20 MG EVERY 8 HOURS   *If you need a refill on your cardiac medications before your next appointment, please call your pharmacy*   Lab Work: NONE If you have labs (blood work) drawn today and your tests are completely normal, you will receive your results only by: Marland Kitchen MyChart Message (if you have MyChart) OR . A paper copy in the mail If you have any lab test that is abnormal or we need to change your treatment, we will call you to review the results.   Testing/Procedures: NONE   Follow-Up: At [facility], you and your health needs are our priority.  As part of our continuing mission to provide you with exceptional heart care, we have created designated Provider Care Teams.  These Care Teams include your primary Cardiologist (physician) and Advanced Practice Providers (APPs -  Physician Assistants and Nurse Practitioners) who all work together to provide you with the care you need, when you need it.  We recommend signing up for the patient portal called "MyChart".  Sign up information is provided on this After Visit Summary.  MyChart is used to connect with patients for Virtual Visits (Telemedicine).  Patients are able to view lab/test results, encounter notes, upcoming appointments, etc.  Non-urgent messages can be sent to your provider as well.   To learn more about what you can do with MyChart, go to [email or web address].    Your next appointment:   3 month(s)  The format for your next appointment:   In Person  Provider:   Thomasene Ripple, DO

## 2020-03-04 NOTE — Progress Notes (Signed)
Cardiology Office Note:    Date:  03/04/2020   ID:  Margaret Golden, DOB July 14, 1990, MRN [id number]  PCP:  [name], NP  Cardiologist:  Thomasene Ripple, DO  Electrophysiologist:  None   Referring MD: [name], NP   "I am having palpitations and shortness of breath."  History of Present Illness:    Margaret Golden is a 29 y.o. female with a hx of NSVT, anemia due to menorrhagia, GAD, MDD, dyspnea, hypothyroidism, and mediastinal lymphadenopathy. She reports that she has had worsening shortness of breath and palpitations over the last few months. She has tolerated propranolol well, but felt that 20 mg has not improved her symptoms. She has had a full cardiac work up in the recent past with cardiac CT and echo, which makes cardiac etiology less likely for palpitations and dyspnea. She has been seen by pulmonology who recommends repeat CT to follow lymphadenopathy, scheduled for this month. Current ddx for hilar LAD is sacoidosis vs malignancy vs infectious process. Patient has also had anemia due to menorrhagia and is scheduled for a hysteroscopy, D&C, and IUD placement on 03/22/20. She has been working on losing weight and has been successful in the last few weeks. She has also seen her therapist weekly and started seeing psychiatry again, on zoloft and lamictal. Patient is in the process of scheduling sleep study for OHS/OSA; awaiting insurance approval.  Past Medical History:  Diagnosis Date  . Acid reflux   . ADD (attention deficit disorder)   . Anemia   . Anxiety   . Arthritis    left ankle  . Back pain   . Chest pain   . Depression   . Dyspnea   . Exercise-induced asthma   . Fatty liver   . History of blood transfusion   . History of chicken pox   . History of kidney stones   . Hypothyroidism   . Joint pain   . Lower extremity edema   . Major depressive disorder   . Migraines    chronic  . Pneumonia   . RLS (restless legs syndrome)   . Thyroid disease     hypothyroid    Past Surgical History:  Procedure Laterality Date  . KNEE ARTHROSCOPY Right 2007  . TONSILLECTOMY  2010  . WISDOM TOOTH EXTRACTION      Current Medications: Current Meds  Medication Sig  . albuterol (VENTOLIN HFA) 108 (90 Base) MCG/ACT inhaler Inhale 2 puffs into the lungs every 6 (six) hours as needed for wheezing or shortness of breath (Chest tightness).  . furosemide (LASIX) 20 MG tablet Take 1 tablet (20 mg total) by mouth 2 (two) times a week.  . Iron-FA-B Cmp-C-Biot-Probiotic (FUSION PLUS) CAPS One capsule twice daily with orange juice. Stop your Ferrous sulfate.  . lamoTRIgine (LAMICTAL) 200 MG tablet Take 1 tablet (200 mg total) by mouth daily.  Marland Kitchen levothyroxine (SYNTHROID) 100 MCG tablet Take 100 mcg by mouth daily before breakfast.  . levothyroxine (SYNTHROID) 112 MCG tablet Take 112 mcg by mouth daily before breakfast.  . LORazepam (ATIVAN) 0.5 MG tablet Take 1 tablet (0.5 mg total) by mouth every 8 (eight) hours.  . Multiple Vitamins-Minerals (MULTIVITAMIN WITH MINERALS) tablet Take 1 tablet by mouth daily.  Marland Kitchen omeprazole (PRILOSEC) 20 MG capsule Take 2 capsules (40 mg total) by mouth daily.  . potassium chloride SA (KLOR-CON) 20 MEQ tablet Take 1 tablet (20 mEq total) by mouth 2 (two) times a week.  . sertraline (ZOLOFT) 100 MG  tablet Take 2 tablets (200 mg total) by mouth daily.  Marland Kitchen. topiramate (TOPAMAX) 50 MG tablet Start with 50mg (1 tab) at bedtime. In 1 week increase to 100mg (2 tabs) at bedtime. (Patient taking differently: Take 100 mg by mouth at bedtime. )  . traZODone (DESYREL) 50 MG tablet Take 1/2-2 po QHS prn insomnia  . Vitamin D, Ergocalciferol, (DRISDOL) 1.25 MG (50000 UNIT) CAPS capsule Take 1 capsule (50,000 Units total) by mouth every 7 (seven) days.  . [DISCONTINUED] propranolol (INDERAL) 20 MG tablet Take 1 tablet (20 mg total) by mouth daily.     Allergies:   Augmentin [amoxicillin-pot clavulanate]   Social History   Socioeconomic  History  . Marital status: Single    Spouse name: Not on file  . Number of children: 0  . Years of education: 8612  . Highest education level: Not on file  Occupational History  . Occupation: Conservation officer, historic buildingsullfillment employee  Tobacco Use  . Smoking status: Never Smoker  . Smokeless tobacco: Never Used  Vaping Use  . Vaping Use: Never used  Substance and Sexual Activity  . Alcohol use: Not Currently    Alcohol/week: 0.0 standard drinks  . Drug use: No  . Sexual activity: Never    Comment: never sexually active  Other Topics Concern  . Not on file  Social History Narrative   Unemployed   Lives alone --update 11/11/19   Seeking work   Only child   Dog 3   Lives in a one story house with father, has a cat-12/26/16-sjb      Caffeine: 2-4 cups/day   Social Determinants of Health   Financial Resource Strain:   . Difficulty of Paying Living Expenses: Not on file  Food Insecurity:   . Worried About Programme researcher, broadcasting/film/videounning Out of Food in the Last Year: Not on file  . Ran Out of Food in the Last Year: Not on file  Transportation Needs:   . Lack of Transportation (Medical): Not on file  . Lack of Transportation (Non-Medical): Not on file  Physical Activity:   . Days of Exercise per Week: Not on file  . Minutes of Exercise per Session: Not on file  Stress:   . Feeling of Stress : Not on file  Social Connections:   . Frequency of Communication with Friends and Family: Not on file  . Frequency of Social Gatherings with Friends and Family: Not on file  . Attends Religious Services: Not on file  . Active Member of Clubs or Organizations: Not on file  . Attends BankerClub or Organization Meetings: Not on file  . Marital Status: Not on file     Family History: The patient's family history includes Arthritis in her maternal grandfather, maternal grandmother, paternal grandfather, and paternal grandmother; Breast cancer in her maternal grandmother; Cancer in her paternal aunt and paternal uncle; Depression in her  father and mother; Diabetes in her maternal grandfather; Obesity in her father and mother; Sleep apnea in her father. There is no history of Migraines.  ROS:   Review of Systems  Constitution: Negative for decreased appetite, fever and weight gain.  HENT: Negative for congestion, ear discharge, hoarse voice and sore throat.   Eyes: Negative for discharge, redness, vision loss in right eye and visual halos.  Cardiovascular: Negative for chest pain and orthopnea Respiratory: Negative for cough, hemoptysis, and snoring.   Endocrine: Negative for heat intolerance and polyphagia.  Hematologic/Lymphatic: Negative for bleeding problem. Does not bruise/bleed easily.  Skin: Negative for flushing, nail changes, rash  and suspicious lesions.  Musculoskeletal: Negative for arthritis, joint pain, muscle cramps, myalgias, neck pain and stiffness.  Gastrointestinal: Negative for abdominal pain, bowel incontinence, diarrhea and excessive appetite.  Genitourinary: Negative for decreased libido, genital sores and incomplete emptying.  Neurological: Negative for brief paralysis, focal weakness, headaches and loss of balance.  Psychiatric/Behavioral: Negative for altered mental status and suicidal ideas.  Allergic/Immunologic: Negative for HIV exposure and persistent infections.    EKGs/Labs/Other Studies Reviewed:    The following studies were reviewed today:   EKG:  The ekg ordered today demonstrates   Recent Labs: 09/30/2019: ALT 14; BUN 8; Creatinine 0.74; Potassium 3.9; Sodium 139 11/20/2019: TSH 0.66 01/25/2020: Hemoglobin 14.5; Platelet Count 194  Recent Lipid Panel    Component Value Date/Time   CHOL 150 09/03/2019 1503   TRIG 120 09/03/2019 1503   HDL 39 (L) 09/03/2019 1503   CHOLHDL 4 07/21/2019 1139   VLDL 36.6 07/21/2019 1139   LDLCALC 89 09/03/2019 1503    Physical Exam:    VS:  BP 112/88   Pulse 84   Ht 5\' 7"  (1.702 m)   Wt (!) 369 lb (167.4 kg)   LMP 02/09/2020   SpO2 98%    BMI 57.79 kg/m     Wt Readings from Last 3 Encounters:  03/04/20 (!) 369 lb (167.4 kg)  02/23/20 (!) 371 lb (168.3 kg)  02/16/20 (!) 371 lb (168.3 kg)     GEN: Well nourished, well developed in no acute distress HEENT: Normal NECK: No JVD; No carotid bruits LYMPHATICS: No cervical or supraclavicular lymphadenopathy CARDIAC: S1S2 noted,RRR, no murmurs, rubs, gallops RESPIRATORY:  Clear to auscultation without rales, wheezing or rhonchi  ABDOMEN: Soft, non-tender, non-distended, +bowel sounds, no guarding. EXTREMITIES: No edema, No cyanosis, no clubbing MUSCULOSKELETAL:  No deformity  SKIN: Warm and dry NEUROLOGIC:  Alert and oriented x 3, non-focal PSYCHIATRIC:  Normal affect, good insight  ASSESSMENT:    1. Palpitations   2. Anemia, unspecified type   3. Hilar lymphadenopathy   4. Morbid obesity (HCC)    PLAN:     1. Since patient has tolerated propranolol well, recommend increasing propranolol to 20 mg TID. Recommend holding or decreasing to qd if dizziness, syncope, or other symptoms develop. Patient to call if any side effects.   2. From a acardiac standpoint, patient can proceed with hysteroscopy on 12/21. Recommend follow up for pre-operative optimization with pulmonology prior to hysteroscopy.   3. Praised for continued effort to lose weight. Right now her priority is on pursuing a diagnosis for her hilar LAD and treating anemia. She will return to [facility] in the new year, but is following the diet plan given to her at home.  4. Patient is to have a sleep study done shortly to r/o OHS/OSA.  The patient is in agreement with the above plan. The patient left the office in stable condition.  The patient will follow up in 3 months or sooner if needed.   Medication Adjustments/Labs and Tests Ordered: Current medicines are reviewed at length with the patient today.  Concerns regarding medicines are outlined above.  No orders of the defined types were  placed in this encounter.  Meds ordered this encounter  Medications  . propranolol (INDERAL) 20 MG tablet    Sig: Take 1 tablet (20 mg total) by mouth 3 (three) times daily.    Dispense:  270 tablet    Refill:  3    Patient Instructions  Medication Instructions:  Your physician has recommended you make the following change in your medication:  1. INCREASE PROPANOLOL 20 MG EVERY 8 HOURS   *If you need a refill on your cardiac medications before your next appointment, please call your pharmacy*   Lab Work: NONE If you have labs (blood work) drawn today and your tests are completely normal, you will receive your results only by: Marland Kitchen MyChart Message (if you have MyChart) OR . A paper copy in the mail If you have any lab test that is abnormal or we need to change your treatment, we will call you to review the results.   Testing/Procedures: NONE   Follow-Up: At [facility], you and your health needs are our priority.  As part of our continuing mission to provide you with exceptional heart care, we have created designated Provider Care Teams.  These Care Teams include your primary Cardiologist (physician) and Advanced Practice Providers (APPs -  Physician Assistants and Nurse Practitioners) who all work together to provide you with the care you need, when you need it.  We recommend signing up for the patient portal called "MyChart".  Sign up information is provided on this After Visit Summary.  MyChart is used to connect with patients for Virtual Visits (Telemedicine).  Patients are able to view lab/test results, encounter notes, upcoming appointments, etc.  Non-urgent messages can be sent to your provider as well.   To learn more about what you can do with MyChart, go to [email or web address].    Your next appointment:   3 month(s)  The format for your next appointment:   In Person  Provider:   Thomasene Ripple, DO        Adopting a Healthy Lifestyle.  Know what a healthy  weight is for you (roughly BMI <25) and aim to maintain this   Aim for 7+ servings of fruits and vegetables daily   65-80+ fluid ounces of water or unsweet tea for healthy kidneys   Limit to max 1 drink of alcohol per day; avoid smoking/tobacco   Limit animal fats in diet for cholesterol and heart health - choose grass fed whenever available   Avoid highly processed foods, and foods high in saturated/trans fats   Aim for low stress - take time to unwind and care for your mental health   Aim for 150 min of moderate intensity exercise weekly for heart health, and weights twice weekly for bone health   Aim for 7-9 hours of sleep daily   When it comes to diets, agreement about the perfect plan isnt easy to find, even among the experts. Experts at the  [facility] developed an idea known as the Healthy Eating Plate. Just imagine a plate divided into logical, healthy portions.   The emphasis is on diet quality:   Load up on vegetables and fruits - one-half of your plate: Aim for color and variety, and remember that potatoes dont count.   Go for whole grains - one-quarter of your plate: Whole wheat, barley, wheat berries, quinoa, oats, brown rice, and foods made with them. If you want pasta, go with whole wheat pasta.   Protein power - one-quarter of your plate: Fish, chicken, beans, and nuts are all healthy, versatile protein sources. Limit red meat.   The diet, however, does go beyond the plate, offering a few other suggestions.   Use healthy plant oils, such as olive, canola, soy, corn, sunflower and peanut. Check the labels, and avoid partially hydrogenated oil, which have  unhealthy trans fats.   If youre thirsty, drink water. Coffee and tea are good in moderation, but skip sugary drinks and limit milk and dairy products to one or two daily servings.   The type of carbohydrate in the diet is more important than the amount. Some sources of carbohydrates, such as  vegetables, fruits, whole grains, and beans-are healthier than others.   Finally, stay active  Osvaldo Shipper, DO PGY-2 03/04/2020 12:16 PM

## 2020-03-04 NOTE — Telephone Encounter (Signed)
Spoke with Mandi at [employer], called to confirm if surgical clearance letter will need to be signed? Advised yes, ok to fax on 03/07/20 to [phone].

## 2020-03-07 ENCOUNTER — Encounter: Payer: Self-pay | Admitting: *Deleted

## 2020-03-08 DIAGNOSIS — F4323 Adjustment disorder with mixed anxiety and depressed mood: Secondary | ICD-10-CM | POA: Diagnosis not present

## 2020-03-08 NOTE — Telephone Encounter (Signed)
Surgical clearance received from [facility] Pulmonology, copy of letter faxed to [facility] PAT.  Letter to Dr. Oscar La to review and sign for scan.   Call to patient to provide update. Left detailed message, ok per dpr. Advised surgical clearance received from cardiology and pulmonology, ok to proceed with surgery as scheduled for 03/22/20. Return call to office if any additional questions.   Routing to provider for final review. Patient is agreeable to disposition. Will close encounter.

## 2020-03-14 ENCOUNTER — Ambulatory Visit (HOSPITAL_COMMUNITY)
Admission: RE | Admit: 2020-03-14 | Discharge: 2020-03-14 | Disposition: A | Discharge status: Home / Self Care | Payer: BC Managed Care – PPO | Source: Ambulatory Visit | Attending: Pulmonary Disease | Admitting: Pulmonary Disease

## 2020-03-14 ENCOUNTER — Other Ambulatory Visit: Payer: Self-pay

## 2020-03-14 DIAGNOSIS — R590 Localized enlarged lymph nodes: Secondary | ICD-10-CM | POA: Diagnosis not present

## 2020-03-14 DIAGNOSIS — R911 Solitary pulmonary nodule: Secondary | ICD-10-CM | POA: Diagnosis not present

## 2020-03-14 DIAGNOSIS — R918 Other nonspecific abnormal finding of lung field: Secondary | ICD-10-CM | POA: Diagnosis not present

## 2020-03-14 MED ORDER — IOHEXOL 300 MG/ML  SOLN
75.0000 mL | Freq: Once | INTRAMUSCULAR | Status: AC | PRN
  Administered 2020-03-14: 75 mL via INTRAVENOUS

## 2020-03-14 NOTE — Telephone Encounter (Signed)
Dr. Francine Graven will be here tomorrow looks like he is not here today and letter will be signed and faxed then.

## 2020-03-14 NOTE — Telephone Encounter (Signed)
Mandi, do you know if this was taken care of? Thank you.

## 2020-03-15 DIAGNOSIS — F4323 Adjustment disorder with mixed anxiety and depressed mood: Secondary | ICD-10-CM | POA: Diagnosis not present

## 2020-03-16 ENCOUNTER — Telehealth: Payer: Self-pay | Admitting: Pulmonary Disease

## 2020-03-16 ENCOUNTER — Other Ambulatory Visit: Payer: Self-pay | Admitting: *Deleted

## 2020-03-16 ENCOUNTER — Encounter (HOSPITAL_COMMUNITY): Payer: Self-pay

## 2020-03-16 ENCOUNTER — Encounter (HOSPITAL_COMMUNITY)
Admission: RE | Admit: 2020-03-16 | Discharge: 2020-03-16 | Disposition: A | Discharge status: Home / Self Care | Payer: BC Managed Care – PPO | Source: Ambulatory Visit | Attending: Obstetrics and Gynecology | Admitting: Obstetrics and Gynecology

## 2020-03-16 ENCOUNTER — Other Ambulatory Visit: Payer: Self-pay

## 2020-03-16 DIAGNOSIS — Z01812 Encounter for preprocedural laboratory examination: Secondary | ICD-10-CM | POA: Insufficient documentation

## 2020-03-16 DIAGNOSIS — R599 Enlarged lymph nodes, unspecified: Secondary | ICD-10-CM

## 2020-03-16 HISTORY — DX: Palpitations: R00.2

## 2020-03-16 LAB — CBC
HCT: 40.2 % (ref 36.0–46.0)
Hemoglobin: 12.6 g/dL (ref 12.0–15.0)
MCH: 27.5 pg (ref 26.0–34.0)
MCHC: 31.3 g/dL (ref 30.0–36.0)
MCV: 87.6 fL (ref 80.0–100.0)
Platelets: 201 10*3/uL (ref 150–400)
RBC: 4.59 MIL/uL (ref 3.87–5.11)
RDW: 12.7 % (ref 11.5–15.5)
WBC: 7.7 10*3/uL (ref 4.0–10.5)
nRBC: 0 % (ref 0.0–0.2)

## 2020-03-16 LAB — COMPREHENSIVE METABOLIC PANEL
ALT: 19 U/L (ref 0–44)
AST: 33 U/L (ref 15–41)
Albumin: 3.6 g/dL (ref 3.5–5.0)
Alkaline Phosphatase: 70 U/L (ref 38–126)
Anion gap: 8 (ref 5–15)
BUN: 8 mg/dL (ref 6–20)
CO2: 27 mmol/L (ref 22–32)
Calcium: 9.2 mg/dL (ref 8.9–10.3)
Chloride: 105 mmol/L (ref 98–111)
Creatinine, Ser: 0.87 mg/dL (ref 0.44–1.00)
GFR, Estimated: >60 mL/min (ref >60)
Glucose, Bld: 101 mg/dL — ABNORMAL HIGH (ref 70–99)
Potassium: 3.9 mmol/L (ref 3.5–5.1)
Sodium: 140 mmol/L (ref 135–145)
Total Bilirubin: 0.5 mg/dL (ref 0.3–1.2)
Total Protein: 7 g/dL (ref 6.5–8.1)

## 2020-03-16 NOTE — Progress Notes (Addendum)
[facility] - [place], [place] - [address] [place] [address] Phone: [phone] Fax: [phone]      Your procedure is scheduled on Tuesday December 21st.  Report to [facility] Main Entrance "A" at 5:30 A.M., and check in at the Admitting office.  Call this number if you have problems the morning of surgery:  [phone]  Call [phone] if you have any questions prior to your surgery date Monday-Friday 8am-4pm    Remember:  Do not eat after midnight the night before your surgery  You may drink clear liquids until 4:30am the morning of your surgery.   Clear liquids allowed are: Water, Non-Citrus Juices (without pulp), Carbonated Beverages, Clear Tea, Black Coffee Only, and Gatorade    Take these medicines the morning of surgery with A SIP OF WATER   lamoTRIgine (LAMICTAL) 200 MG tablet  levothyroxine (SYNTHROID)   propranolol (INDERAL) 20 MG tablet   sertraline (ZOLOFT) 100 MG tablet IF NEEDED  albuterol (VENTOLIN HFA) 108 (90 Base) MCG/ACT inhaler, please bring your inhaler with you to the hospital  LORazepam (ATIVAN) 0.5 MG tablet    As of today, STOP taking any Aspirin (unless otherwise instructed by your surgeon) Aleve, Naproxen, Ibuprofen, Motrin, Advil, Goody's, BC's, all herbal medications, fish oil, and all vitamins.                      Do not wear jewelry, make up, or nail polish            Do not wear lotions, powders, perfumes, or deodorant.            Do not shave 48 hours prior to surgery.              Do not bring valuables to the hospital.            [facility] is not responsible for any belongings or valuables.  Do NOT Smoke (Tobacco/Vaping) or drink Alcohol 24 hours prior to your procedure If you use a CPAP at night, you may bring all equipment for your overnight stay.   Contacts, glasses, dentures or bridgework may not be worn into surgery.      For patients admitted to the  hospital, discharge time will be determined by your treatment team.   Patients discharged the day of surgery will not be allowed to drive home, and someone needs to stay with them for 24 hours.    Special instructions:   [place]- Preparing For Surgery  Before surgery, you can play an important role. Because skin is not sterile, your skin needs to be as free of germs as possible. You can reduce the number of germs on your skin by washing with CHG (chlorahexidine gluconate) Soap before surgery.  CHG is an antiseptic cleaner which kills germs and bonds with the skin to continue killing germs even after washing.    Oral Hygiene is also important to reduce your risk of infection.  Remember - BRUSH YOUR TEETH THE MORNING OF SURGERY WITH YOUR REGULAR TOOTHPASTE  Please do not use if you have an allergy to CHG or antibacterial soaps. If your skin becomes reddened/irritated stop using the CHG.  Do not shave (including legs and underarms) for at least 48 hours prior to first CHG shower. It is OK to shave your face.  Please follow these instructions carefully.   1. Shower the [employer] BEFORE SURGERY  and the MORNING OF SURGERY with CHG Soap.   2. If you chose to wash your hair, wash your hair first as usual with your normal shampoo.  3. After you shampoo, rinse your hair and body thoroughly to remove the shampoo.  4. Use CHG as you would any other liquid soap. You can apply CHG directly to the skin and wash gently with a scrungie or a clean washcloth.   5. Apply the CHG Soap to your body ONLY FROM THE NECK DOWN.  Do not use on open wounds or open sores. Avoid contact with your eyes, ears, mouth and genitals (private parts). Wash Face and genitals (private parts)  with your normal soap.   6. Wash thoroughly, paying special attention to the area where your surgery will be performed.  7. Thoroughly rinse your body with warm water from the neck down.  8. DO NOT shower/wash with your normal soap  after using and rinsing off the CHG Soap.  9. Pat yourself dry with a CLEAN TOWEL.  10. Wear CLEAN PAJAMAS to bed the night before surgery  11. Place CLEAN SHEETS on your bed the night of your first shower and DO NOT SLEEP WITH PETS.   Day of Surgery: Wear Clean/Comfortable clothing the morning of surgery Do not apply any deodorants/lotions.   Remember to brush your teeth WITH YOUR REGULAR TOOTHPASTE.   Please read over the following fact sheets that you were given.

## 2020-03-16 NOTE — Telephone Encounter (Signed)
Patient is returning call.  °

## 2020-03-16 NOTE — Telephone Encounter (Signed)
Message to OR coordinator on 12/14 to confirm [place] tech availability and Mirena IUD.   Call to patient to review cytotec instructions, Left message to call Noreene Larsson, RN at [facility] [phone].

## 2020-03-16 NOTE — Progress Notes (Addendum)
PCP - Macario Carls, NP Cardiologist - Dr. Servando Salina with [facility] (Had chest pain and SOB in 2020)  Chest x-ray - Not indicated EKG - 08/26/19 Stress Test - Denies ECHO - 09/14/19 Cardiac Cath - Denies  Sleep Study - Yes has OSA CPAP - awaiting  DM - Denies  ERAS Protcol -Instruction given  COVID TEST- 03/18/20  Anesthesia review: Yes cardiac history  Patient denies shortness of breath, fever, cough and chest pain at PAT appointment  *Has had chest tightness, chest pain, and palpitations for 1 year now. Seeing Cardiology and pulmonology with [facility].    All instructions explained to the patient, with a verbal understanding of the material. Patient agrees to go over the instructions while at home for a better understanding. Patient also instructed to self quarantine after being tested for COVID-19. The opportunity to ask questions was provided.

## 2020-03-16 NOTE — Telephone Encounter (Signed)
-----   Message from Romualdo Bolk, MD sent at 03/13/2020  8:32 AM EST ----- This patient needs ultrasound guidance and a mirena IUD. I've ordered both on the preop orders, but am concerned because this is at [facility] and not [facility]. I'm not sure if they have the IUD's and want to make sure the U/S tech can come. Also please have her use misoprostol 200 mcg, 2 tablets vaginally the night prior to the surgery. Please inform her about the misoprostol, let her know it may make her crampy and call it in to her pharmacy. Thanks! Noreene Larsson

## 2020-03-16 NOTE — Telephone Encounter (Signed)
Hi Brad,  I spoke with this patient about her recent CT Chest scan results. We discussed her case back in September and decided at that time her lymphadenopathy wasn't large enough to strongly recommend pursuing EBUS. The repeat scan does indicate slight growth of the lymph nodes with most measuring slightly >1cm. The patient would like to move forward with EBUS evaluation. I mentioned that there may be availability on 12/28 and she said that would be fine if possible. I told her our front office would be reaching out to her in regards to the date and time of the procedure.   Let me know if there's anything else you need me to do to get her scheduled.   Thanks for your help, Cletis Athens

## 2020-03-17 ENCOUNTER — Encounter (HOSPITAL_COMMUNITY): Payer: Self-pay

## 2020-03-17 MED ORDER — MISOPROSTOL 200 MCG PO TABS: ORAL_TABLET | ORAL | 0 refills | Status: DC

## 2020-03-17 NOTE — Telephone Encounter (Signed)
Spoke with patient, advised of Cytotec and reviewed instructions, Rx to verified pharmacy. Questions answered. Patient verbalizes understanding and is agreeable.    Received confirmation from OR coordinator, Chassity, on 12/15, Ultrasound is scheduled for case and Mirena IUD will be available.   Routing to provider for final review. Patient is agreeable to disposition. Will close encounter.

## 2020-03-17 NOTE — Progress Notes (Addendum)
Anesthesia Chart Review:  Case: [id number] Date/Time: 03/22/20 0715   Procedures:      DILATATION & CURETTAGE/HYSTEROSCOPY WITH MYOSURE (N/A )     INTRAUTERINE DEVICE (IUD) INSERTION (N/A ) - Mirena IUD insertion     OPERATIVE ULTRASOUND (N/A ) - Ultrasound assited Mirena IUD insertion Confirmed with [facility] in [place] on 03/16/20 that this is on their schedule.  CS   Anesthesia type: Choice   Pre-op diagnosis: Menorrhagia, Anemia, Hematometra   [facility]: [facility] ROOM 08 / [facility]   Surgeons: Romualdo Bolk, MD      DISCUSSION: Patient is a 29 year old female scheduled for the above procedure.  Surgery was initially scheduled for 08/17/19 with Valentina Shaggy, MD, but her PCP had referred her for cardiology evaluation due to chest pain. She was unable to make a late appointment in [place], so surgery was postponed. Since then she has had cardiac testing with Dr. Servando Salina and referred to pulmonology for mediastinal lymphadenopathy and multiple pulmonary nodules and may be considered for bronchoscopy/EBUS in the near future. She was also diagnosed with severe OSA. Preoperative cardiology and pulmonology input outlined below. Case is now scheduled with Dr. Oscar La.  Other history includes never smoker, hypothyroidism, fatty liver, ADD, anxiety, RLS, exercise-induced asthma, migraines, anemia (s/p PRBC for HGB 6.6 04/2019), depression, chest pain (no CAD, Coronary Ca score 0 11/23/19 CCTA), palpitations (NSVT on ~ 6/2021event monitor, treated b-blocker), LE edema, acid reflux, dyspnea, OSA, back pain.   BMI is consistent with morbid obesity.  Preoperative cardiology input outlined by Nada Boozer, NP on 02/29/20: ".Marland KitchenMarland KitchenTherefore, based on ACC/AHA guidelines, the patient would be at acceptable risk for the planned procedure without further cardiovascular testing..."   Preoperative pulmonology input per Dr. Francine Graven on 03/04/20 (see Letters tab), "Gay Filler is low risk for post-operative respiratory complications.  She is cleared for surgical procedure."  She received her second Pfizer COVID-19 vaccine on 07/20/19.    Preoperative COVID-19 test is scheduled for 03/18/2020.  Anesthesia team to evaluate on the day of surgery.   VS: BP 132/71   Pulse 100   Temp 36.7 C (Oral)   Resp 19   Ht 5\' 7"  (1.702 m)   Wt (!) 167.1 kg   SpO2 99%   BMI 57.68 kg/m     PROVIDERS: , NP is PCP  [name], MD is pulmonologist Melody Comas, DO is cardiologist Dohmeier, Thomasene Ripple, MD is neurologist (OSA) Porfirio Mylar, MD is neurologist (headaches)   LABS: Labs reviewed: Acceptable for surgery. A1c 4.9% 09/03/19.  (all labs ordered are listed, but only abnormal results are displayed)  Labs Reviewed  COMPREHENSIVE METABOLIC PANEL - Abnormal; Notable for the following components:      Result Value   Glucose, Bld 101 (*)    All other components within normal limits  CBC    Sleep Study 02/10/20: IMPRESSION: 1. Severe Obstructive Sleep Apnea (OSA) with AHI 48.9/h. further exacerbated in REM sleep to an AHI of 65/h. 2. No Periodic Limb Movement Disorder (PLMD) 3. Soft Snoring  RECOMMENDATIONS: 1. Advise full night, attended, CPAP titration study to optimize therapy.   2. If insurance will not allow return to attended titration, order auto CPAP 6-16 cm water pressure. Mask of choice (patient's choice after fitting in reclined position) and heated humidity.     IMAGES: CT Chest 03/14/20: IMPRESSION: - Mild interval progression of thoracic adenopathy. This can be reactive, as can be seen in atypical infection such as mononucleosis, inflammatory, as can  be seen with sarcoidosis, or lymphoproliferative in nature. PET CT examination may be helpful for further evaluation as well as potential identification of a a optimal biopsy target, if desired. - Numerous pulmonary nodules, stable in both size and number. Appropriate follow-up guidelines have not been established in a patient of  this age, however, in a patient without significant risk factors for malignancy, a follow-up examination in 1 year may be helpful to document stability and confirm an underlying benign etiology. - Stable moderate splenomegaly  MRI Brain 11/16/19: IMPRESSION:  This is a normal MRI of the brain with and without contrast.  Incidental note is made of minimal chronic inflammatory changes in the maxillary sinuses.      EKG: 08/26/19: Normal sinus rhythm Low voltage QRS Borderline ECG   CV: CT Coronary 11/23/19: IMPRESSION: 1. No evidence of CAD, CADRADS = 0. 2. Coronary calcium score of 0. This was 0 percentile for age and sex matched control. 3. Normal coronary origin with right dominance. 4. Please note significant radiology findings of adenopathy that may explain chest pain.   Echocardiogram 09/14/19: IMPRESSIONS   1. Left ventricular ejection fraction, by estimation, is 60 to 65%. The  left ventricle has normal function. The left ventricle has no regional  wall motion abnormalities. Left ventricular diastolic parameters were  normal.  2. Right ventricular systolic function is normal. The right ventricular  size is normal.  3. The mitral valve is normal in structure. Trivial mitral valve  regurgitation. No evidence of mitral stenosis.  4. The aortic valve is normal in structure. Aortic valve regurgitation is  not visualized. No aortic stenosis is present.  5. The inferior vena cava is normal in size with greater than 50%  respiratory variability, suggesting right atrial pressure of 3 mmHg.    Long term cardiac monitor 08/26/19-09/02/19: Indication: Palpitations - The minimum heart rate was 62 bpm, maximum heart rate was 207 bpm, and average heart rate was 94 bpm. Predominant underlying rhythm was Sinus Rhythm. - 2 Ventricular Tachycardia runs occurred, the run with the fastest interval lasting 4 beats with a max rate of 207 bpm, the longest lasting 4 beats with an avg  rate of 117 bpm.  - Premature atrial complexes were rare (<1.0%).   - Premature Ventricular complexes were rare (<1.0%). - No supraventricular tachycardia, No  pauses, No AV block and no atrial fibrillation present. - Patient triggered events was 27 and diary events 9 all associated with sinus rhythm.  - Conclusion: This study is remarkable for Nonsustained ventricular tachycardia.     Past Medical History:  Diagnosis Date  . Acid reflux   . ADD (attention deficit disorder)   . Anemia   . Anxiety   . Arthritis    left ankle  . Back pain   . Chest pain   . Depression   . Dyspnea   . Exercise-induced asthma   . Fatty liver   . History of blood transfusion   . History of chicken pox   . History of kidney stones   . Hypothyroidism   . Joint pain   . Lower extremity edema   . Major depressive disorder   . Migraines    chronic  . Palpitations   . Pneumonia   . RLS (restless legs syndrome)   . Sleep apnea 2021  . Thyroid disease    hypothyroid    Past Surgical History:  Procedure Laterality Date  . KNEE ARTHROSCOPY Right 2007  . TONSILLECTOMY  2010  .  TONSILLECTOMY    . WISDOM TOOTH EXTRACTION    . WISDOM TOOTH EXTRACTION Bilateral    upper and lower    MEDICATIONS: . albuterol (VENTOLIN HFA) 108 (90 Base) MCG/ACT inhaler  . aspirin-acetaminophen-caffeine (EXCEDRIN MIGRAINE) 250-250-65 MG tablet  . furosemide (LASIX) 20 MG tablet  . Iron-FA-B Cmp-C-Biot-Probiotic (FUSION PLUS) CAPS  . lamoTRIgine (LAMICTAL) 200 MG tablet  . levothyroxine (SYNTHROID) 100 MCG tablet  . levothyroxine (SYNTHROID) 112 MCG tablet  . LORazepam (ATIVAN) 0.5 MG tablet  . misoprostol (CYTOTEC) 200 MCG tablet  . Multiple Vitamins-Minerals (MULTIVITAMIN WITH MINERALS) tablet  . omeprazole (PRILOSEC) 20 MG capsule  . potassium chloride SA (KLOR-CON) 20 MEQ tablet  . propranolol (INDERAL) 20 MG tablet  . sertraline (ZOLOFT) 100 MG tablet  . topiramate (TOPAMAX) 50 MG tablet  . traZODone  (DESYREL) 50 MG tablet  . Vitamin D, Ergocalciferol, (DRISDOL) 1.25 MG (50000 UNIT) CAPS capsule   No current facility-administered medications for this encounter.    Shonna Chock, PA-C Surgical Short Stay/Anesthesiology [facility] Phone [phone] [facility] Phone [phone] 03/17/2020 2:32 PM

## 2020-03-18 ENCOUNTER — Other Ambulatory Visit (HOSPITAL_COMMUNITY)
Admission: RE | Admit: 2020-03-18 | Discharge: 2020-03-18 | Disposition: A | Discharge status: Home / Self Care | Payer: BC Managed Care – PPO | Source: Ambulatory Visit | Attending: Obstetrics and Gynecology | Admitting: Obstetrics and Gynecology

## 2020-03-18 DIAGNOSIS — Z20822 Contact with and (suspected) exposure to covid-19: Secondary | ICD-10-CM | POA: Insufficient documentation

## 2020-03-18 DIAGNOSIS — Z01812 Encounter for preprocedural laboratory examination: Secondary | ICD-10-CM | POA: Insufficient documentation

## 2020-03-18 NOTE — Telephone Encounter (Signed)
PCCM:  Thanks for the referral.   Planned EBUS bronchoscopy will be for 03/01/2020  Orders placed  Josephine Igo, DO [facility] 03/18/2020 3:48 PM

## 2020-03-18 NOTE — Addendum Note (Signed)
Addended by: Josephine Igo on: 03/18/2020 03:53 PM   Modules accepted: Orders

## 2020-03-19 LAB — SARS CORONAVIRUS 2 (TAT 6-24 HRS): SARS Coronavirus 2: NEGATIVE

## 2020-03-21 ENCOUNTER — Telehealth: Payer: Self-pay | Admitting: Pulmonary Disease

## 2020-03-21 NOTE — Telephone Encounter (Signed)
noted 

## 2020-03-21 NOTE — Anesthesia Preprocedure Evaluation (Addendum)
Anesthesia Evaluation  Patient identified by MRN, date of birth, ID band Patient awake    Reviewed: Allergy & Precautions, NPO status , Patient's Chart, lab work & pertinent test results  Airway Mallampati: III  TM Distance: >3 FB Neck ROM: Full    Dental  (+) Dental Advisory Given   Pulmonary shortness of breath, asthma , sleep apnea ,    breath sounds clear to auscultation       Cardiovascular hypertension, Pt. on medications and Pt. on home beta blockers  Rhythm:Regular Rate:Normal  09/2019 Echo: Normal EF valves okay   Neuro/Psych  Headaches,    GI/Hepatic Neg liver ROS, GERD  ,  Endo/Other  Hypothyroidism   Renal/GU negative Renal ROS     Musculoskeletal  (+) Arthritis ,   Abdominal   Peds  Hematology negative hematology ROS (+)   Anesthesia Other Findings   Reproductive/Obstetrics                            Lab Results  Component Value Date   WBC 7.7 03/16/2020   HGB 12.6 03/16/2020   HCT 40.2 03/16/2020   MCV 87.6 03/16/2020   PLT 201 03/16/2020   Lab Results  Component Value Date   CREATININE 0.87 03/16/2020   BUN 8 03/16/2020   NA 140 03/16/2020   K 3.9 03/16/2020   CL 105 03/16/2020   CO2 27 03/16/2020    Anesthesia Physical Anesthesia Plan  ASA: III  Anesthesia Plan: General   Post-op Pain Management:    Induction: Intravenous  PONV Risk Score and Plan: 3 and Dexamethasone, Ondansetron and Treatment may vary due to age or medical condition  Airway Management Planned: Oral ETT  Additional Equipment:   Intra-op Plan:   Post-operative Plan: Extubation in OR  Informed Consent: I have reviewed the patients History and Physical, chart, labs and discussed the procedure including the risks, benefits and alternatives for the proposed anesthesia with the patient or authorized representative who has indicated his/her understanding and acceptance.     Dental  advisory given  Plan Discussed with: CRNA  Anesthesia Plan Comments:        Anesthesia Quick Evaluation

## 2020-03-21 NOTE — Telephone Encounter (Signed)
The following has been scheduled for the patient & patient has been made aware:  EBUS Set for 12/30 @ 1300 (pt aware to check in by 10:30 AM) COVID Test set for 12/27 @ 1400

## 2020-03-22 ENCOUNTER — Ambulatory Visit (HOSPITAL_COMMUNITY)
Admission: RE | Admit: 2020-03-22 | Discharge: 2020-03-22 | Disposition: A | Discharge status: Home / Self Care | Payer: BC Managed Care – PPO | Source: Ambulatory Visit | Attending: Obstetrics and Gynecology | Admitting: Obstetrics and Gynecology

## 2020-03-22 ENCOUNTER — Ambulatory Visit (HOSPITAL_COMMUNITY): Payer: BC Managed Care – PPO | Admitting: Vascular Surgery

## 2020-03-22 ENCOUNTER — Encounter (HOSPITAL_COMMUNITY): Payer: Self-pay | Admitting: Obstetrics and Gynecology

## 2020-03-22 ENCOUNTER — Other Ambulatory Visit: Payer: Self-pay

## 2020-03-22 ENCOUNTER — Ambulatory Visit (HOSPITAL_COMMUNITY): Payer: BC Managed Care – PPO | Admitting: Certified Registered"

## 2020-03-22 ENCOUNTER — Encounter (HOSPITAL_COMMUNITY)
Admission: RE | Disposition: A | Discharge status: Home / Self Care | Payer: Self-pay | Source: Ambulatory Visit | Attending: Obstetrics and Gynecology

## 2020-03-22 DIAGNOSIS — I10 Essential (primary) hypertension: Secondary | ICD-10-CM | POA: Diagnosis not present

## 2020-03-22 DIAGNOSIS — Z30430 Encounter for insertion of intrauterine contraceptive device: Secondary | ICD-10-CM | POA: Diagnosis not present

## 2020-03-22 DIAGNOSIS — N857 Hematometra: Secondary | ICD-10-CM | POA: Insufficient documentation

## 2020-03-22 DIAGNOSIS — N840 Polyp of corpus uteri: Secondary | ICD-10-CM | POA: Diagnosis not present

## 2020-03-22 DIAGNOSIS — N920 Excessive and frequent menstruation with regular cycle: Secondary | ICD-10-CM | POA: Insufficient documentation

## 2020-03-22 DIAGNOSIS — E782 Mixed hyperlipidemia: Secondary | ICD-10-CM | POA: Diagnosis not present

## 2020-03-22 DIAGNOSIS — E039 Hypothyroidism, unspecified: Secondary | ICD-10-CM | POA: Diagnosis not present

## 2020-03-22 DIAGNOSIS — Z79890 Hormone replacement therapy (postmenopausal): Secondary | ICD-10-CM | POA: Diagnosis not present

## 2020-03-22 DIAGNOSIS — D509 Iron deficiency anemia, unspecified: Secondary | ICD-10-CM | POA: Diagnosis not present

## 2020-03-22 DIAGNOSIS — Z881 Allergy status to other antibiotic agents status: Secondary | ICD-10-CM | POA: Insufficient documentation

## 2020-03-22 DIAGNOSIS — Z880 Allergy status to penicillin: Secondary | ICD-10-CM | POA: Diagnosis not present

## 2020-03-22 DIAGNOSIS — Z79899 Other long term (current) drug therapy: Secondary | ICD-10-CM | POA: Insufficient documentation

## 2020-03-22 HISTORY — PX: INTRAUTERINE DEVICE (IUD) INSERTION: SHX5877

## 2020-03-22 HISTORY — PX: DILATATION & CURETTAGE/HYSTEROSCOPY WITH MYOSURE: SHX6511

## 2020-03-22 LAB — POCT PREGNANCY, URINE: Preg Test, Ur: NEGATIVE

## 2020-03-22 SURGERY — DILATATION & CURETTAGE/HYSTEROSCOPY WITH MYOSURE
Anesthesia: General

## 2020-03-22 MED ORDER — LIDOCAINE 2% (20 MG/ML) 5 ML SYRINGE
INTRAMUSCULAR | Status: DC | PRN
  Administered 2020-03-22: 80 mg via INTRAVENOUS

## 2020-03-22 MED ORDER — PROPOFOL 10 MG/ML IV BOLUS
INTRAVENOUS | Status: DC | PRN
  Administered 2020-03-22: 300 mg via INTRAVENOUS

## 2020-03-22 MED ORDER — LACTATED RINGERS IV SOLN: INTRAVENOUS | Status: DC

## 2020-03-22 MED ORDER — POVIDONE-IODINE 10 % EX SWAB
2.0000 "application " | Freq: Once | CUTANEOUS | Status: AC
  Administered 2020-03-22: 2 via TOPICAL

## 2020-03-22 MED ORDER — ONDANSETRON HCL 4 MG/2ML IJ SOLN
INTRAMUSCULAR | Status: AC
  Filled 2020-03-22: qty 2

## 2020-03-22 MED ORDER — ROCURONIUM BROMIDE 10 MG/ML (PF) SYRINGE
PREFILLED_SYRINGE | INTRAVENOUS | Status: AC
  Filled 2020-03-22: qty 10

## 2020-03-22 MED ORDER — PROPOFOL 10 MG/ML IV BOLUS
INTRAVENOUS | Status: AC
  Filled 2020-03-22: qty 20

## 2020-03-22 MED ORDER — MIDAZOLAM HCL 2 MG/2ML IJ SOLN
INTRAMUSCULAR | Status: AC
  Filled 2020-03-22: qty 2

## 2020-03-22 MED ORDER — AMISULPRIDE (ANTIEMETIC) 5 MG/2ML IV SOLN
10.0000 mg | Freq: Once | INTRAVENOUS | Status: AC | PRN
  Administered 2020-03-22: 10 mg via INTRAVENOUS

## 2020-03-22 MED ORDER — FENTANYL CITRATE (PF) 250 MCG/5ML IJ SOLN
INTRAMUSCULAR | Status: AC
  Filled 2020-03-22: qty 5

## 2020-03-22 MED ORDER — FENTANYL CITRATE (PF) 100 MCG/2ML IJ SOLN
25.0000 ug | INTRAMUSCULAR | Status: DC | PRN
  Administered 2020-03-22 (×3): 50 ug via INTRAVENOUS

## 2020-03-22 MED ORDER — PROPOFOL 1000 MG/100ML IV EMUL
INTRAVENOUS | Status: AC
  Filled 2020-03-22: qty 100

## 2020-03-22 MED ORDER — DEXAMETHASONE SODIUM PHOSPHATE 10 MG/ML IJ SOLN
INTRAMUSCULAR | Status: AC
  Filled 2020-03-22: qty 1

## 2020-03-22 MED ORDER — ROCURONIUM BROMIDE 10 MG/ML (PF) SYRINGE
PREFILLED_SYRINGE | INTRAVENOUS | Status: DC | PRN
  Administered 2020-03-22: 40 mg via INTRAVENOUS

## 2020-03-22 MED ORDER — KETOROLAC TROMETHAMINE 30 MG/ML IJ SOLN
30.0000 mg | Freq: Once | INTRAMUSCULAR | Status: AC
  Administered 2020-03-22: 30 mg via INTRAVENOUS

## 2020-03-22 MED ORDER — KETAMINE HCL 10 MG/ML IJ SOLN
INTRAMUSCULAR | Status: DC | PRN
  Administered 2020-03-22: 20 mg via INTRAVENOUS

## 2020-03-22 MED ORDER — SUCCINYLCHOLINE CHLORIDE 200 MG/10ML IV SOSY
PREFILLED_SYRINGE | INTRAVENOUS | Status: DC | PRN
  Administered 2020-03-22: 140 mg via INTRAVENOUS

## 2020-03-22 MED ORDER — CHLORHEXIDINE GLUCONATE 0.12 % MT SOLN
OROMUCOSAL | Status: AC
  Administered 2020-03-22: 15 mL via OROMUCOSAL
  Filled 2020-03-22: qty 15

## 2020-03-22 MED ORDER — ONDANSETRON HCL 4 MG/2ML IJ SOLN
INTRAMUSCULAR | Status: DC | PRN
  Administered 2020-03-22: 4 mg via INTRAVENOUS

## 2020-03-22 MED ORDER — KETAMINE HCL 50 MG/5ML IJ SOSY
PREFILLED_SYRINGE | INTRAMUSCULAR | Status: AC
  Filled 2020-03-22: qty 5

## 2020-03-22 MED ORDER — SODIUM CHLORIDE 0.9 % IR SOLN
Status: DC | PRN
  Administered 2020-03-22: 3000 mL

## 2020-03-22 MED ORDER — KETOROLAC TROMETHAMINE 30 MG/ML IJ SOLN
INTRAMUSCULAR | Status: AC
  Filled 2020-03-22: qty 1

## 2020-03-22 MED ORDER — ARTIFICIAL TEARS OPHTHALMIC OINT
TOPICAL_OINTMENT | OPHTHALMIC | Status: AC
  Filled 2020-03-22: qty 3.5

## 2020-03-22 MED ORDER — SUCCINYLCHOLINE CHLORIDE 200 MG/10ML IV SOSY
PREFILLED_SYRINGE | INTRAVENOUS | Status: AC
  Filled 2020-03-22: qty 10

## 2020-03-22 MED ORDER — FENTANYL CITRATE (PF) 100 MCG/2ML IJ SOLN
INTRAMUSCULAR | Status: AC
  Filled 2020-03-22: qty 4

## 2020-03-22 MED ORDER — IBUPROFEN 800 MG PO TABS: 800.0000 mg | ORAL_TABLET | Freq: Three times a day (TID) | ORAL | 0 refills | Status: DC | PRN

## 2020-03-22 MED ORDER — LEVONORGESTREL 20 MCG/24HR IU IUD
INTRAUTERINE_SYSTEM | INTRAUTERINE | Status: AC
  Filled 2020-03-22: qty 1

## 2020-03-22 MED ORDER — LIDOCAINE 2% (20 MG/ML) 5 ML SYRINGE
INTRAMUSCULAR | Status: AC
  Filled 2020-03-22: qty 5

## 2020-03-22 MED ORDER — DEXAMETHASONE SODIUM PHOSPHATE 10 MG/ML IJ SOLN
INTRAMUSCULAR | Status: DC | PRN
  Administered 2020-03-22 (×2): 10 mg via INTRAVENOUS

## 2020-03-22 MED ORDER — CHLORHEXIDINE GLUCONATE 0.12 % MT SOLN: 15.0000 mL | Freq: Once | OROMUCOSAL | Status: AC

## 2020-03-22 MED ORDER — SUGAMMADEX SODIUM 200 MG/2ML IV SOLN
INTRAVENOUS | Status: DC | PRN
  Administered 2020-03-22: 360 mg via INTRAVENOUS

## 2020-03-22 MED ORDER — FENTANYL CITRATE (PF) 100 MCG/2ML IJ SOLN
INTRAMUSCULAR | Status: DC | PRN
  Administered 2020-03-22: 50 ug via INTRAVENOUS

## 2020-03-22 MED ORDER — PROPOFOL 10 MG/ML IV BOLUS
INTRAVENOUS | Status: AC
  Filled 2020-03-22: qty 40

## 2020-03-22 MED ORDER — AMISULPRIDE (ANTIEMETIC) 5 MG/2ML IV SOLN
INTRAVENOUS | Status: AC
  Filled 2020-03-22: qty 4

## 2020-03-22 MED ORDER — ACETAMINOPHEN 500 MG PO TABS
1000.0000 mg | ORAL_TABLET | ORAL | Status: AC
  Administered 2020-03-22: 1000 mg via ORAL
  Filled 2020-03-22: qty 2

## 2020-03-22 SURGICAL SUPPLY — 14 items
DEVICE MYOSURE REACH (MISCELLANEOUS) ×2 IMPLANT
GLOVE BIOGEL PI IND STRL 7.0 (GLOVE) ×2 IMPLANT
GLOVE BIOGEL PI INDICATOR 7.0 (GLOVE) ×2
GLOVE ECLIPSE 6.5 STRL STRAW (GLOVE) ×2 IMPLANT
GOWN STRL REUS W/ TWL LRG LVL3 (GOWN DISPOSABLE) ×2 IMPLANT
GOWN STRL REUS W/TWL LRG LVL3 (GOWN DISPOSABLE) ×4
KIT PROCEDURE FLUENT (KITS) ×2 IMPLANT
KIT TURNOVER KIT B (KITS) ×2 IMPLANT
Mirena IUD ×2 IMPLANT
PACK VAGINAL MINOR WOMEN LF (CUSTOM PROCEDURE TRAY) ×2 IMPLANT
PAD OB MATERNITY 4.3X12.25 (PERSONAL CARE ITEMS) ×2 IMPLANT
SEAL ROD LENS SCOPE MYOSURE (ABLATOR) ×2 IMPLANT
TOWEL GREEN STERILE FF (TOWEL DISPOSABLE) ×2 IMPLANT
UNDERPAD 30X36 HEAVY ABSORB (UNDERPADS AND DIAPERS) ×2 IMPLANT

## 2020-03-22 NOTE — Op Note (Signed)
Preoperative Diagnosis: menorrhagia, history of anemia, BMI 58  Postoperative Diagnosis: same  Procedure: Hysteroscopy, dilation and curettage with myosure, mirena IUD insertion  Surgeon: Dr Gertie Exon  Assistants: None  Anesthesia: General  EBL: minimal  Fluids: 1,000 cc LR  Fluid deficit: 280  Urine output: not recorded  Indications for surgery: The patient is a 29 yo female, who presented with menorrhagia and anemia. Work up included an ultrasound with hematometra, but was otherwise normal. She is hypothyroid, earlier this year she went off her synthroid and her TSH was elevated. With restarting the synthroid her TSH normalized. The risks of the surgery were reviewed with the patient and the consent form was signed prior to her surgery.  Findings: EUA: no masses. Hysteroscopy: very thickened, irregular endometrium. Normal tubal ostia bilateraly  Specimens: endometrial curetting's    Procedure: The patient was taken to the operating room with an IV in place. She was placed in the dorsal lithotomy position and anesthesia was administered. She was prepped and draped in the usual sterile fashion for a vaginal procedure. She voided prior to going to the OR. A weighted speculum was placed in the vagina and a single tooth tenaculum was placed on the anterior lip of the cervix. The cervix was dilated to a #6.5 hagar dilator. The uterus was sounded to 8 cm. The myosure hysteroscope was inserted into the uterine cavity. With continuous infusion of normal saline, the uterine cavity was visualized with the above findings. The myosure reach was used to remove the shaggy endometrium. The myosure was then removed. The cavity was then curetted with the small sharp curette. The cavity had the characteristically gritty texture at the end of the procedure. The curette was re,pved and the mirena IUD was placed. The strings were cut to 3-4 cm. The tenaculum and the speculum were removed. The patients  perineum was cleansed of betadine and she was taken out of the dorsal lithotomy position.  Upon awakening she was extubated and transferred to the recovery room in stable and awake condition.  The sponge and instrument count were correct. There were no complications.   CC: [name], NP; Emeline Gins, NP

## 2020-03-22 NOTE — Transfer of Care (Signed)
Immediate Anesthesia Transfer of Care Note  Patient: Margaret Golden  Procedure(s) Performed: DILATATION & CURETTAGE/HYSTEROSCOPY WITH MYOSURE (N/A ) INTRAUTERINE DEVICE (IUD) INSERTION (N/A )  Patient Location: PACU  Anesthesia Type:General  Level of Consciousness: awake, alert , oriented and patient cooperative  Airway & Oxygen Therapy: Patient Spontanous Breathing and Patient connected to face mask oxygen  Post-op Assessment: Report given to RN and Post -op Vital signs reviewed and stable  Post vital signs: Reviewed and stable  Last Vitals:  Vitals Value Taken Time  BP 130/116 03/22/20 0834  Temp    Pulse 81 03/22/20 0838  Resp 11 03/22/20 0838  SpO2 100 % 03/22/20 0838  Vitals shown include unvalidated device data.  Last Pain:  Vitals:   03/22/20 0657  TempSrc:   PainSc: 2          Complications: No complications documented.

## 2020-03-22 NOTE — Interval H&P Note (Signed)
History and Physical Interval Note:  03/22/2020 7:14 AM  Margaret Golden  has presented today for surgery, with the diagnosis of Menorrhagia, Anemia, Hematometra.  The various methods of treatment have been discussed with the patient and family. After consideration of risks, benefits and other options for treatment, the patient has consented to  Procedure(s) with comments: DILATATION & CURETTAGE/HYSTEROSCOPY WITH MYOSURE (N/A) INTRAUTERINE DEVICE (IUD) INSERTION (N/A) - Mirena IUD insertion OPERATIVE ULTRASOUND (N/A) - Ultrasound assited Mirena IUD insertion Confirmed with [facility] in [place] on 03/16/20 that this is on their schedule.  CS as a surgical intervention.  The patient's history has been reviewed, patient examined, no change in status, stable for surgery.  I have reviewed the patient's chart and labs.  Questions were answered to the patient's satisfaction.     Romualdo Bolk

## 2020-03-22 NOTE — Anesthesia Procedure Notes (Signed)
Procedure Name: Intubation Performed by: Cleda Daub, CRNA Pre-anesthesia Checklist: Patient identified, Emergency Drugs available, Suction available and Patient being monitored Patient Re-evaluated:Patient Re-evaluated prior to induction Oxygen Delivery Method: Circle system utilized Preoxygenation: Pre-oxygenation with 100% oxygen Induction Type: IV induction Ventilation: Mask ventilation without difficulty Laryngoscope Size: Mac and 4 Grade View: Grade I Tube type: Oral Number of attempts: 1 Airway Equipment and Method: Stylet and Oral airway Placement Confirmation: ETT inserted through vocal cords under direct vision,  positive ETCO2 and breath sounds checked- equal and bilateral Secured at: 21 cm Tube secured with: Tape Dental Injury: Teeth and Oropharynx as per pre-operative assessment

## 2020-03-22 NOTE — Progress Notes (Signed)
Stable / in p2 awaiting father's arrival for transportation since moving to p2

## 2020-03-23 ENCOUNTER — Ambulatory Visit: Payer: BC Managed Care – PPO | Admitting: Allergy

## 2020-03-23 ENCOUNTER — Encounter (HOSPITAL_COMMUNITY): Payer: Self-pay | Admitting: Obstetrics and Gynecology

## 2020-03-23 VITALS — BP 110/72 | HR 80 | Temp 97.7°F | Resp 14 | Ht 67.0 in | Wt 372.2 lb

## 2020-03-23 DIAGNOSIS — R12 Heartburn: Secondary | ICD-10-CM

## 2020-03-23 DIAGNOSIS — T50905A Adverse effect of unspecified drugs, medicaments and biological substances, initial encounter: Secondary | ICD-10-CM | POA: Insufficient documentation

## 2020-03-23 DIAGNOSIS — T781XXD Other adverse food reactions, not elsewhere classified, subsequent encounter: Secondary | ICD-10-CM | POA: Diagnosis not present

## 2020-03-23 DIAGNOSIS — R0602 Shortness of breath: Secondary | ICD-10-CM | POA: Diagnosis not present

## 2020-03-23 DIAGNOSIS — T50905D Adverse effect of unspecified drugs, medicaments and biological substances, subsequent encounter: Secondary | ICD-10-CM

## 2020-03-23 DIAGNOSIS — J3089 Other allergic rhinitis: Secondary | ICD-10-CM

## 2020-03-23 LAB — SURGICAL PATHOLOGY

## 2020-03-23 NOTE — Assessment & Plan Note (Signed)
Eggs cause slight nausea. Concerned about food allergies.  Today's skin testing was negative to select top foods including eggs.  If you notice worsening symptoms after eating eggs, then recommend avoidance.

## 2020-03-23 NOTE — Patient Instructions (Addendum)
Today's skin testing showed: Positive to tree pollen and one perennial mold. Negative to top common foods including eggs.  Environmental allergies  Start environmental control measures as below.  May use over the counter antihistamines such as Zyrtec (cetirizine), Claritin (loratadine), Allegra (fexofenadine), or Xyzal (levocetirizine) daily as needed. May take twice a day during flares.  Foods:  If you notice worsening symptoms after eating eggs, then recommend avoidance.  Breathing:  Continue to follow up with your pulmonologist.   Heartburn:  Continue omeprazole 20mg  daily.  See below for heartburn lifestyle.  Drug allergy: Consider penicillin skin testing/drug challenge in the future. More than 90% of patients outgrow their penicillin allergy.  Continue to avoid Augmentin for now.  Skin:  Moisturize daily.   See below for proper skin care.   Follow up as needed.  Reducing Pollen Exposure . Pollen seasons: trees (spring), grass (summer) and ragweed/weeds (fall). 08-09-1981 Keep windows closed in your home and car to lower pollen exposure.  Marland Kitchen air conditioning in the bedroom and throughout the house if possible.  . Avoid going out in dry windy days - especially early morning. . Pollen counts are highest between 5 - 10 AM and on dry, hot and windy days.  . Save outside activities for late afternoon or after a heavy rain, when pollen levels are lower.  . Avoid mowing of grass if you have grass pollen allergy. Lilian Kapur Be aware that pollen can also be transported indoors on people and pets.  . Dry your clothes in an automatic dryer rather than hanging them outside where they might collect pollen.  . Rinse hair and eyes before bedtime. Mold Control . Mold and fungi can grow on a variety of surfaces provided certain temperature and moisture conditions exist.  . Outdoor molds grow on plants, decaying vegetation and soil. The major outdoor mold, Alternaria and Cladosporium, are  found in very high numbers during hot and dry conditions. Generally, a late summer - fall peak is seen for common outdoor fungal spores. Rain will temporarily lower outdoor mold spore count, but counts rise rapidly when the rainy period ends. . The most important indoor molds are Aspergillus and Penicillium. Dark, humid and poorly ventilated basements are ideal sites for mold growth. The next most common sites of mold growth are the bathroom and the kitchen. Outdoor (Seasonal) Mold Control . Use air conditioning and keep windows closed. . Avoid exposure to decaying vegetation. 01-27-1977 Avoid leaf raking. . Avoid grain handling. . Consider wearing a face mask if working in moldy areas.  Indoor (Perennial) Mold Control  . Maintain humidity below 50%. . Get rid of mold growth on hard surfaces with water, detergent and, if necessary, 5% bleach (do not mix with other cleaners). Then dry the area completely. If mold covers an area more than 10 square feet, consider hiring an indoor environmental professional. . For clothing, washing with soap and water is best. If moldy items cannot be cleaned and dried, throw them away. . Remove sources e.g. contaminated carpets. . Repair and seal leaking roofs or pipes. Using dehumidifiers in damp basements may be helpful, but empty the water and clean units regularly to prevent mildew from forming. All rooms, especially basements, bathrooms and kitchens, require ventilation and cleaning to deter mold and mildew growth. Avoid carpeting on concrete or damp floors, and storing items in damp areas.    Skin care recommendations  Bath time: . Always use lukewarm water. AVOID very hot or cold water. Marland Kitchen Keep  bathing time to 5-10 minutes. . Do NOT use bubble bath. . Use a mild soap and use just enough to wash the dirty areas. . Do NOT scrub skin vigorously.  . After bathing, pat dry your skin with a towel. Do NOT rub or scrub the skin.  Moisturizers and prescriptions:   . ALWAYS apply moisturizers immediately after bathing (within 3 minutes). This helps to lock-in moisture. . Use the moisturizer several times a day over the whole body. Peri Jefferson summer moisturizers include: Aveeno, CeraVe, Cetaphil. Peri [place] winter moisturizers include: Aquaphor, Vaseline, Cerave, Cetaphil, Eucerin, Vanicream. . When using moisturizers along with medications, the moisturizer should be applied about one hour after applying the medication to prevent diluting effect of the medication or moisturize around where you applied the medications. When not using medications, the moisturizer can be continued twice daily as maintenance.  Laundry and clothing: . Avoid laundry products with added color or perfumes. . Use unscented hypo-allergenic laundry products such as Tide free, Cheer free & gentle, and All free and clear.  . If the skin still seems dry or sensitive, you can try double-rinsing the clothes. . Avoid tight or scratchy clothing such as wool. . Do not use fabric softeners or dyer sheets.  Heartburn Heartburn is a type of pain or discomfort that can happen in the throat or chest. It is often described as a burning pain. It may also cause a bad, acid-like taste in the mouth. Heartburn may feel worse when you lie down or bend over. It may be worse at night. It may be caused by stomach contents that move back up (reflux) into the tube that connects the mouth with the stomach (esophagus). Follow these instructions at home: Eating and drinking   Avoid certain foods and drinks as told by your doctor. This may include: ? Coffee and tea (with or without caffeine). ? Drinks that have alcohol. ? Energy drinks and sports drinks. ? Carbonated drinks or sodas. ? Chocolate and cocoa. ? Peppermint and mint flavorings. ? Garlic and onions. ? Horseradish. ? Spicy and acidic foods, such as:  Peppers.  Chili powder and curry powder.  Vinegar.  Hot sauces and BBQ sauce. ? Citrus fruit  juices and citrus fruits, such as:  Oranges.  Lemons.  Limes. ? Tomato-based foods, such as:  Red sauce and pizza with red sauce.  Chili.  Salsa. ? Fried and fatty foods, such as:  Donuts.  [place] fries and potato chips.  High-fat dressings. ? High-fat meats, such as:  Hot dogs and sausage.  Rib eye steak.  Ham and bacon. ? High-fat dairy items, such as:  Whole milk.  Butter.  Cream cheese.  Eat small meals often. Avoid eating large meals.  Avoid drinking large amounts of liquid with your meals.  Avoid eating meals during the 2-3 hours before bedtime.  Avoid lying down right after you eat.  Do not exercise right after you eat. Lifestyle      If you are overweight, lose an amount of weight that is healthy for you. Ask your doctor about a safe weight loss goal.  Do not use any products that contain nicotine or tobacco, including cigarettes, e-cigarettes, and chewing tobacco. These can make your symptoms worse. If you need help quitting, ask your doctor.  Wear loose clothes. Do not wear anything tight around your waist.  Raise (elevate) the head of your bed about 6 inches (15 cm) when you sleep.  Try to lower your stress. If you  need help doing this, ask your doctor. General instructions  Pay attention to any changes in your symptoms.  Take over-the-counter and prescription medicines only as told by your doctor. ? Do not take aspirin, ibuprofen, or other NSAIDs unless your doctor says it is okay. ? Stop medicines only as told by your doctor.  Keep all follow-up visits as told by your doctor. This is important. Contact a doctor if:  You have new symptoms.  You lose weight and you do not know why it is happening.  You have trouble swallowing, or it hurts to swallow.  You have wheezing or a cough that keeps happening.  Your symptoms do not get better with treatment.  You have heartburn often for more than 2 weeks. Get help right away  if:  You have pain in your arms, neck, jaw, teeth, or back.  You feel sweaty, dizzy, or light-headed.  You have chest pain or shortness of breath.  You throw up (vomit) and your throw up looks like blood or coffee grounds.  Your poop (stool) is bloody or black. These symptoms may represent a serious problem that is an emergency. Do not wait to see if the symptoms will go away. Get medical help right away. Call your local emergency services (911 in the [place] Do not drive yourself to the hospital. Summary  Heartburn is a type of pain that can happen in the throat or chest. It can feel like a burning pain. It may also cause a bad, acid-like taste in the mouth.  You may need to avoid certain foods and drinks to help your symptoms. Ask your doctor what foods and drinks you should avoid.  Take over-the-counter and prescription medicines only as told by your doctor. Do not take aspirin, ibuprofen, or other NSAIDs unless your doctor told you to do so.  Contact your doctor if your symptoms do not get better or they get worse. This information is not intended to replace advice given to you by your health care provider. Make sure you discuss any questions you have with your health care provider.

## 2020-03-23 NOTE — Anesthesia Postprocedure Evaluation (Signed)
Anesthesia Post Note  Patient: Margaret Golden  Procedure(s) Performed: DILATATION & CURETTAGE/HYSTEROSCOPY WITH MYOSURE (N/A ) INTRAUTERINE DEVICE (IUD) INSERTION (N/A )     Patient location during evaluation: PACU Anesthesia Type: General Level of consciousness: awake and alert Pain management: pain level controlled Vital Signs Assessment: post-procedure vital signs reviewed and stable Respiratory status: spontaneous breathing, nonlabored ventilation, respiratory function stable and patient connected to nasal cannula oxygen Cardiovascular status: blood pressure returned to baseline and stable Postop Assessment: no apparent nausea or vomiting Anesthetic complications: no   No complications documented.  Last Vitals:  Vitals:   03/22/20 0852 03/22/20 0900  BP:  140/85  Pulse:  80  Resp:  10  Temp:    SpO2: 95% 93%    Last Pain:  Vitals:   03/22/20 0926  TempSrc:   PainSc: 4    Pain Goal:                   Kennieth Rad

## 2020-03-23 NOTE — Progress Notes (Signed)
New Patient Note  RE: Margaret Golden MRN: [id number] DOB: 10-06-1990 Date of Office Visit: 03/23/2020  Referring provider: [name], NP Primary care provider: [name], NP  Chief Complaint: Allergy Testing (Wants to know what she allergic to. Says she has thinks she's had allergies since a child but was never treated and believes that symptoms have worsened as she had grown in adulthood. )  History of Present Illness: I had the pleasure of seeing Margaret Golden for initial evaluation at the [facility] on 03/23/2020. She is a 29 y.o. female, who is referred here by [name], NP for the evaluation of environmental allergies.  She reports symptoms of nasal congestion, rhinorrhea, sneezing. Symptoms have been going on for 20+ years. The symptoms are present mainly during the spring and fall. Other triggers include exposure to possibly dust. Anosmia: no. Headache: not sure as she has chronic headaches and migraines. She has used benadryl with some improvement in symptoms. Does not Golden to use nasal sprays. Sinus infections: may had 1 this year. Previous work up includes: none. Previous ENT evaluation: yes but not for sinus issues. History of nasal polyps: no. Last eye exam: 2 years ago. History of reflux: yes on omeprazole 20mg  daily with good benefit.  She reports symptoms of chest tightness, shortness of breath, coughing, rare wheezing and has been worsening for the past 1.5 years. Current medications include albuterol prn which does not help. She reports not using aerochamber with inhalers. She tried the following inhalers: none. Main triggers are unknown but daily activities make it worse. In the last month, frequency of symptoms: daily. Frequency of nocturnal symptoms: 0x/month. Frequency of SABA use: 0x/week. Interference with physical activity: yes. Sleep is undisturbed. In the last 12 months, emergency room visits/urgent care  visits/doctor office visits or hospitalizations due to respiratory issues: once. In the last 12 months, oral steroids courses: not sure. Lifetime history of hospitalization for respiratory issues: yes but had anemia. Prior intubations: no. History of pneumonia: twice. She was evaluated by pulmonologist in the past. Smoking exposure: no but lived with smoker. Up to date with flu vaccine: yes. Up to date with COVID-19 vaccine: yes.  No prior COVID-19 diagnosis.  Patient saw pulmonology and had CT chest and getting a lymph node biopsy later this month.  Patient saw cardiology and had negative work up per patient report.   Patient had IUD and cervical procedure done yesterday.   03/14/2020 CT chest: "IMPRESSION: Mild interval progression of thoracic adenopathy. This can be reactive, as can be seen in atypical infection such as mononucleosis, inflammatory, as can be seen with sarcoidosis, or lymphoproliferative in nature. PET CT examination may be helpful for further evaluation as well as potential identification of a a optimal biopsy target, if desired.  Numerous pulmonary nodules, stable in both size and number. Appropriate follow-up guidelines have not been established in a patient of this age, however, in a patient without significant risk factors for malignancy, a follow-up examination in 1 year may be helpful to document stability and confirm an underlying benign Etiology."  Assessment and Plan: Margaret Golden is a 29 y.o. female with: Other allergic rhinitis Rhinitis symptoms mainly in the spring and fall since childhood. Using benadryl prn with good benefit. Does not Golden to use nasal sprays. No prior allergy work up.   Today's skin testing: Positive to tree pollen and one perennial mold.  Start environmental control measures as below.  May use over the counter antihistamines  such as Zyrtec (cetirizine), Claritin (loratadine), Allegra (fexofenadine), or Xyzal (levocetirizine) daily as  needed. May take twice a day during flares.  Declines nasal sprays.  Shortness of breath Shortness of breath, chest tightness, coughing and rare wheezing which has been worsening the past 1.5 years. Saw cardiology and pulmonology - scheduled for video bronchoscopy. Albuterol does not help.   Continue to follow up with pulmonologist.  Adverse reaction to food, subsequent encounter Eggs cause slight nausea. Concerned about food allergies.  Today's skin testing was negative to select top foods including eggs.  If you notice worsening symptoms after eating eggs, then recommend avoidance.  Heartburn  Continue omeprazole  daily.  See below for heartburn lifestyle.  Drug reaction Hives after Augmentin about 10 years ago. Consider penicillin skin testing/drug challenge in the future. More than 90% of patients outgrow their penicillin allergy.  Continue to avoid Augmentin for now.  Return if symptoms worsen or fail to improve.  Other allergy screening: Food allergy: no  Eggs cause slight nausea.  Dietary History: patient has been eating other foods including milk, eggs, peanut, treenuts, sesame, wheat, meats, fruits and vegetables. Limited soy, does not Golden seafood.   Medication allergy: yes  Augmentin - hives about 10 years ago.  Hymenoptera allergy: no Urticaria: no Eczema:no History of recurrent infections suggestive of immunodeficency: no  Diagnostics: Skin Testing: Environmental allergy panel and select foods. Positive to tree pollen and one perennial mold. Negative to top common foods including eggs. Results discussed with patient/family.  Airborne Adult Perc - 03/23/20 1448    Time Antigen Placed 1448    Allergen Manufacturer Waynette Buttery    Location Back    Number of Test 59    1. Control-Buffer 50% Glycerol Negative    2. Control-Histamine 1 mg/ml 2+    3. Albumin saline Negative    4. Bahia Negative    5. [place] Negative    6. Johnson Negative    7. [place]  Blue Negative    8. Meadow Fescue Negative    9. Perennial Rye Negative    10. Sweet Vernal Negative    11. Timothy Negative    12. Cocklebur Negative    13. Burweed Marshelder Negative    14. Ragweed, short Negative    15. Ragweed, Giant Negative    16. Plantain,  English Negative    17. Lamb's Quarters Negative    18. Sheep Sorrell Negative    19. Rough Pigweed Negative    20. Marsh Elder, Rough Negative    21. Mugwort, Common Negative    22. Ash mix Negative    23. Birch mix Negative    24. Beech American 2+    25. Box, Elder 2+    26. Cedar, red Negative    27. Cottonwood, Guinea-[place]issau Negative    28. Elm mix Negative    29. Hickory Negative    30. Maple mix Negative    31. Oak, [place] mix Negative    32. Pecan Pollen Negative    33. Pine mix Negative    34. Sycamore Eastern Negative    35. Walnut, Black Pollen Negative    36. Alternaria alternata Negative    37. Cladosporium Herbarum Negative    38. Aspergillus mix Negative    39. Penicillium mix Negative    40. Bipolaris sorokiniana (Helminthosporium) Negative    41. Drechslera spicifera (Curvularia) Negative    42. Mucor plumbeus Negative    43. Fusarium moniliforme Negative    44. Aureobasidium pullulans (  pullulara) 2+    45. Rhizopus oryzae Negative    46. Botrytis cinera Negative    47. Epicoccum nigrum Negative    48. Phoma betae Negative    49. Candida Albicans Negative    50. Trichophyton mentagrophytes Negative    51. Mite, D Farinae  5,000 AU/ml Negative    52. Mite, D Pteronyssinus  5,000 AU/ml Negative    53. Cat Hair 10,000 BAU/ml Negative    54.  Dog Epithelia Negative    55. Mixed Feathers Negative    56. Horse Epithelia Negative    57. Cockroach, German Negative    58. Mouse Negative    59. Tobacco Leaf Negative          Food Perc - 03/23/20 1450      Test Information   Time Antigen Placed 1450    Allergen Manufacturer Waynette Buttery    Location Back    Number of allergen test 10      Food   1.  Peanut Negative    2. Soybean food Negative    3. Wheat, whole Negative    4. Sesame Negative    5. Milk, cow Negative    6. Egg White, chicken Negative    7. Casein Negative    8. Shellfish mix Negative    9. Fish mix Negative    10. Cashew Negative          Intradermal - 03/23/20 1532    Time Antigen Placed 1533    Allergen Manufacturer Waynette Buttery    Location Back    Number of Test 13    Intradermal Select          Food Adult Perc - 03/23/20 1400    Time Antigen Placed 1449    Allergen Manufacturer Waynette Buttery    Location Back    Number of allergen test 11    Control-Histamine 1 mg/ml Omitted    1. Peanut Omitted    2. Soybean Omitted    3. Wheat Omitted    4. Sesame Omitted    5. Milk, cow Omitted    6. Egg White, Chicken Omitted    7. Casein Omitted    8. Shellfish Mix Omitted    9. Fish Mix Omitted    10. Cashew Omitted    11. Pecan Food Omitted    12. [employer] Omitted    13. Almond Omitted    14. Hazelnut Omitted    15. [place] nut Omitted    16. Coconut Omitted    17. Pistachio Omitted    18. Catfish Omitted    19. Bass Omitted    20. Trout Omitted    21. Tuna Omitted    22. Salmon Omitted    23. Flounder Omitted    24. Codfish Omitted    25. Shrimp Omitted    26. Crab Omitted    27. Lobster Omitted    28. Oyster Omitted    29. Scallops Omitted    30. Barley Omitted    31. Oat  Omitted    32. Rye  Omitted    33. Hops Omitted    34. Rice Omitted    35. Cottonseed Omitted    36. Saccharomyces Cerevisiae  Omitted    37. Pork Omitted    38. [place] Meat Omitted    39. Chicken Meat Omitted    40. Beef Omitted    41. Lamb Omitted    42. Tomato Omitted    43. White Potato Omitted  44. Sweet Potato Omitted    45. Pea, Green/English Omitted    46. Navy Bean Omitted    47. Mushrooms Omitted    48. Avocado Omitted    49. Onion Omitted    50. Cabbage Omitted    51. Carrots Omitted    52. Celery Omitted    53. Corn Omitted    54. Cucumber Omitted     55. Grape (White seedless) Omitted    56. Orange  Omitted    57. Banana Omitted    58. Apple Omitted    59. Peach Omitted    60. Strawberry Omitted    61. Cantaloupe Omitted    62. Watermelon Omitted    63. Pineapple Omitted    64. Chocolate/Cacao bean Omitted    65. Karaya Gum Omitted    66. Acacia (Arabic Gum) Omitted    67. Cinnamon Omitted    68. Nutmeg Omitted    69. Ginger Omitted    70. Garlic Omitted    71. Pepper, black Omitted    72. Mustard Omitted           Past Medical History: Patient Active Problem List   Diagnosis Date Noted   Other allergic rhinitis 03/23/2020   Adverse reaction to food, subsequent encounter 03/23/2020   Shortness of breath 03/23/2020   Heartburn 03/23/2020   Drug reaction 03/23/2020   Acid reflux    ADD (attention deficit disorder)    Anemia    Anxiety    Arthritis    Back pain    Chest pain    Dyspnea    Exercise-induced asthma    Fatty liver    History of blood transfusion    History of chicken pox    History of kidney stones    Joint pain    Lower extremity edema    Major depressive disorder    Pneumonia    Thyroid disease    Sleep apnea 02/23/2020   History of iron deficiency anemia 02/23/2020   BMI 50.0-59.9, adult (HCC) 02/23/2020   Mediastinal lymphadenopathy 12/23/2019   Pulmonary nodules 12/23/2019   RLS (restless legs syndrome) 12/21/2019   Super obesity 12/21/2019   Papilledema 12/21/2019   Obesity with alveolar hypoventilation and body mass index (BMI) of 40 or greater (HCC) 12/21/2019   Morning headache 12/21/2019   NSVT (nonsustained ventricular tachycardia) (HCC) 12/09/2019   Mixed hyperlipidemia 12/09/2019   Palpitations 12/09/2019   IDA (iron deficiency anemia) 10/20/2019   Hematometra 07/15/2019   Menorrhagia with regular cycle 07/15/2019   Symptomatic anemia 04/07/2019   Social phobia 02/26/2018   Major depressive disorder, recurrent episode, mild (HCC)  02/26/2018   Insomnia 02/26/2018   Left ankle pain 02/28/2017   Class 3 severe obesity due to excess calories with serious comorbidity in adult (HCC) 05/11/2016   Fatty liver disease, nonalcoholic 02/02/2015   Depression 01/03/2015   Hypothyroidism 01/03/2015   GERD (gastroesophageal reflux disease) 01/03/2015   Migraines 01/03/2015   Essential hypertension 06/02/2014   Past Medical History:  Diagnosis Date   Acid reflux    ADD (attention deficit disorder)    Anemia    Anxiety    Arthritis    left ankle   Back pain    Chest pain    Depression    Dyspnea    Exercise-induced asthma    Fatty liver    History of blood transfusion    History of chicken pox    History of kidney stones    Hypothyroidism  Joint pain    Lower extremity edema    Major depressive disorder    Migraines    chronic   Palpitations    Pneumonia    RLS (restless legs syndrome)    Sleep apnea 2021   Thyroid disease    hypothyroid   Past Surgical History: Past Surgical History:  Procedure Laterality Date   DILATATION & CURETTAGE/HYSTEROSCOPY WITH MYOSURE N/A 03/22/2020   Procedure: DILATATION & CURETTAGE/HYSTEROSCOPY WITH MYOSURE;  Surgeon: Romualdo Bolk, MD;  Location: [facility] OR;  Service: Gynecology;  Laterality: N/A;   INTRAUTERINE DEVICE (IUD) INSERTION N/A 03/22/2020   Procedure: INTRAUTERINE DEVICE (IUD) INSERTION;  Surgeon: Romualdo Bolk, MD;  Location: [facility] OR;  Service: Gynecology;  Laterality: N/A;  Mirena IUD insertion   KNEE ARTHROSCOPY Right 2007   TONSILLECTOMY  2010   TONSILLECTOMY     WISDOM TOOTH EXTRACTION     WISDOM TOOTH EXTRACTION Bilateral    upper and lower   Medication List:  Current Outpatient Medications  Medication Sig Dispense Refill   albuterol (VENTOLIN HFA) 108 (90 Base) MCG/ACT inhaler Inhale 2 puffs into the lungs every 6 (six) hours as needed for wheezing or shortness of breath (Chest tightness). 18 g 0    aspirin-acetaminophen-caffeine (EXCEDRIN MIGRAINE) 250-250-65 MG tablet Take 2 tablets by mouth daily as needed for headache or migraine.     furosemide (LASIX) 20 MG tablet Take 1 tablet (20 mg total) by mouth 2 (two) times a week. 26 tablet 0   ibuprofen (ADVIL) 800 MG tablet Take 1 tablet (800 mg total) by mouth every 8 (eight) hours as needed. 30 tablet 0   Iron-FA-B Cmp-C-Biot-Probiotic (FUSION PLUS) CAPS One capsule twice daily with orange juice. Stop your Ferrous sulfate. (Patient taking differently: Take 1 capsule by mouth 2 (two) times daily. with orange juice. Stop your Ferrous sulfate.) 30 capsule 1   lamoTRIgine (LAMICTAL) 200 MG tablet Take 1 tablet (200 mg total) by mouth daily. 30 tablet 3   levothyroxine (SYNTHROID) 100 MCG tablet Take 100 mcg by mouth daily before breakfast. Take with the 112 mcg dose to equal 212 mcg daily     levothyroxine (SYNTHROID) 112 MCG tablet Take 112 mcg by mouth daily before breakfast. Take with the 100 mcg dose to equal 212 mcg daily     LORazepam (ATIVAN) 0.5 MG tablet Take 1 tablet (0.5 mg total) by mouth every 8 (eight) hours. (Patient taking differently: Take 0.5 mg by mouth every 8 (eight) hours as needed for anxiety.) 30 tablet 1   Multiple Vitamins-Minerals (MULTIVITAMIN WITH MINERALS) tablet Take 1 tablet by mouth daily.     omeprazole (PRILOSEC) 20 MG capsule Take 2 capsules (40 mg total) by mouth daily. 180 capsule 2   potassium chloride SA (KLOR-CON) 20 MEQ tablet Take 1 tablet (20 mEq total) by mouth 2 (two) times a week. 26 tablet 0   propranolol (INDERAL) 20 MG tablet Take 1 tablet (20 mg total) by mouth 3 (three) times daily. 270 tablet 3   sertraline (ZOLOFT) 100 MG tablet Take 2 tablets (200 mg total) by mouth daily. 90 tablet 1   topiramate (TOPAMAX) 50 MG tablet Start with 50mg (1 tab) at bedtime. In 1 week increase to 100mg (2 tabs) at bedtime. (Patient taking differently: Take 100 mg by mouth at bedtime.) 180 tablet 6    traZODone (DESYREL) 50 MG tablet Take 1/2-2 po QHS prn insomnia (Patient taking differently: Take 25-100 mg by mouth at bedtime as needed for sleep.) 60 tablet  1   Vitamin D, Ergocalciferol, (DRISDOL) 1.25 MG (50000 UNIT) CAPS capsule Take 1 capsule (50,000 Units total) by mouth every 7 (seven) days. (Patient taking differently: Take 50,000 Units by mouth every Monday.) 4 capsule 0   No current facility-administered medications for this visit.   Allergies: Allergies  Allergen Reactions   Augmentin [Amoxicillin-Pot Clavulanate] Hives   Social History: Social History   Socioeconomic History   Marital status: Single    Spouse name: Not on file   Number of children: 0   Years of education: 12   Highest education level: Not on file  Occupational History   Occupation: Conservation officer, historic buildings  Tobacco Use   Smoking status: Never Smoker   Smokeless tobacco: Never Used  Building services engineer Use: Never used  Substance and Sexual Activity   Alcohol use: Not Currently    Alcohol/week: 0.0 standard drinks   Drug use: No   Sexual activity: Never    Comment: never sexually active  Other Topics Concern   Not on file  Social History Narrative   Unemployed   Lives alone --update 11/11/19   Seeking work   Only child   Dog 3   Lives in a one story house with father, has a cat-12/26/16-sjb      Caffeine: 2-4 cups/day   Social Determinants of Corporate investment banker Strain: Not on file  Food Insecurity: Not on file  Transportation Needs: Not on file  Physical Activity: Not on file  Stress: Not on file  Social Connections: Not on file   Lives in a 30 year old house. Smoking: no Occupation: not employed  Environmental HistorySurveyor, minerals in the house: no Engineer, civil (consulting) in the family room: no Carpet in the bedroom: yes Heating: electric Cooling: central Pet: yes 2 cats  Family History: Family History  Problem Relation Age of Onset   Depression Mother     Obesity Mother    Depression Father    Sleep apnea Father    Obesity Father    Cancer Paternal Aunt        colon   Cancer Paternal Uncle        brain cancer   Diabetes Maternal Grandfather    Arthritis Maternal Grandfather    Arthritis Maternal Grandmother    Breast cancer Maternal Grandmother    Arthritis Paternal Grandmother    Arthritis Paternal Grandfather    Migraines Neg Hx    Problem                               Relation Asthma                                   No  Eczema                                No  Food allergy                          No  Allergic rhino conjunctivitis     No   Review of Systems  Constitutional: Negative for appetite change, chills, fever and unexpected weight change.  HENT: Negative for congestion and rhinorrhea.   Eyes: Negative for itching.  Respiratory: Positive for chest tightness and shortness of breath.  Negative for cough and wheezing.   Cardiovascular: Negative for chest pain.  Gastrointestinal: Negative for abdominal pain.  Genitourinary: Negative for difficulty urinating.  Skin: Negative for rash.  Allergic/Immunologic: Positive for environmental allergies. Negative for food allergies.  Neurological: Negative for headaches.   Objective: BP 110/72    Pulse 80    Temp 97.7 F (36.5 C)    Resp 14    Ht 5\' 7"  (1.702 m)    Wt (!) 372 lb 3.2 oz (168.8 kg)    LMP 03/11/2020    SpO2 98%    BMI 58.29 kg/m  Body mass index is 58.29 kg/m. Physical Exam Vitals and nursing note reviewed.  Constitutional:      Appearance: Normal appearance. She is well-developed. She is obese.  HENT:     Head: Normocephalic and atraumatic.     Right Ear: Tympanic membrane and external ear normal.     Left Ear: Tympanic membrane and external ear normal.     Nose: Nose normal.     Mouth/Throat:     Mouth: Mucous membranes are moist.     Pharynx: Oropharynx is clear.  Eyes:     Conjunctiva/sclera: Conjunctivae normal.  Cardiovascular:      Rate and Rhythm: Normal rate and regular rhythm.     Heart sounds: Normal heart sounds. No murmur heard. No friction rub. No gallop.   Pulmonary:     Effort: Pulmonary effort is normal.     Breath sounds: Normal breath sounds. No wheezing, rhonchi or rales.  Musculoskeletal:     Cervical back: Neck supple.  Skin:    General: Skin is warm and dry.     Findings: No rash.     Comments: Very dry skin on feet b/l.  Neurological:     Mental Status: She is alert and oriented to person, place, and time.  Psychiatric:        Behavior: Behavior normal.    The plan was reviewed with the patient/family, and all questions/concerned were addressed.  It was my pleasure to see Margaret Golden today and participate in her care. Please feel free to contact me with any questions or concerns.  Sincerely,  Wyline MoodYoon Eliane Hammersmith, DO Allergy & Immunology  [facility] [place] office: [phone] [facility] office: [phone]

## 2020-03-23 NOTE — Assessment & Plan Note (Signed)
Hives after Augmentin about 10 years ago. Consider penicillin skin testing/drug challenge in the future. More than 90% of patients outgrow their penicillin allergy.  Continue to avoid Augmentin for now.

## 2020-03-23 NOTE — Assessment & Plan Note (Addendum)
Rhinitis symptoms mainly in the spring and fall since childhood. Using benadryl prn with good benefit. Does not like to use nasal sprays. No prior allergy work up.   Today's skin testing: Positive to tree pollen and one perennial mold.  Start environmental control measures as below.  May use over the counter antihistamines such as Zyrtec (cetirizine), Claritin (loratadine), Allegra (fexofenadine), or Xyzal (levocetirizine) daily as needed. May take twice a day during flares.  Declines nasal sprays.

## 2020-03-23 NOTE — Assessment & Plan Note (Signed)
   Continue omeprazole 20mg  daily.  See below for heartburn lifestyle.

## 2020-03-23 NOTE — Assessment & Plan Note (Signed)
Shortness of breath, chest tightness, coughing and rare wheezing which has been worsening the past 1.5 years. Saw cardiology and pulmonology - scheduled for video bronchoscopy. Albuterol does not help.   Continue to follow up with pulmonologist.

## 2020-03-28 ENCOUNTER — Other Ambulatory Visit (HOSPITAL_COMMUNITY)
Admission: RE | Admit: 2020-03-28 | Discharge: 2020-03-28 | Disposition: A | Discharge status: Home / Self Care | Payer: BC Managed Care – PPO | Source: Ambulatory Visit | Attending: Pulmonary Disease | Admitting: Pulmonary Disease

## 2020-03-28 DIAGNOSIS — R590 Localized enlarged lymph nodes: Secondary | ICD-10-CM | POA: Diagnosis not present

## 2020-03-28 DIAGNOSIS — Z880 Allergy status to penicillin: Secondary | ICD-10-CM | POA: Diagnosis not present

## 2020-03-28 DIAGNOSIS — Z01812 Encounter for preprocedural laboratory examination: Secondary | ICD-10-CM | POA: Insufficient documentation

## 2020-03-28 DIAGNOSIS — Z560 Unemployment, unspecified: Secondary | ICD-10-CM | POA: Diagnosis not present

## 2020-03-28 DIAGNOSIS — Z20822 Contact with and (suspected) exposure to covid-19: Secondary | ICD-10-CM | POA: Insufficient documentation

## 2020-03-28 DIAGNOSIS — I10 Essential (primary) hypertension: Secondary | ICD-10-CM | POA: Diagnosis not present

## 2020-03-28 DIAGNOSIS — Z6841 Body Mass Index (BMI) 40.0 and over, adult: Secondary | ICD-10-CM | POA: Diagnosis not present

## 2020-03-29 ENCOUNTER — Other Ambulatory Visit: Payer: Self-pay

## 2020-03-29 ENCOUNTER — Encounter (HOSPITAL_COMMUNITY): Payer: Self-pay | Admitting: Pulmonary Disease

## 2020-03-29 DIAGNOSIS — F4323 Adjustment disorder with mixed anxiety and depressed mood: Secondary | ICD-10-CM | POA: Diagnosis not present

## 2020-03-29 LAB — SARS CORONAVIRUS 2 (TAT 6-24 HRS): SARS Coronavirus 2: NEGATIVE

## 2020-03-29 NOTE — Progress Notes (Addendum)
Ms Cherie Dark denies chest pain or shortness of breath.  Patient tested negative on 12/27 and has been in quarantine since that time.  Ms Simmie Davies had a sleep study and was diagnosed with severe sleep apnea, patient does not have thew CPAP a yet, waiting on Insurance to approve.  Earlier in the year , Ms Fleischhacker had complaints of Palpations an increase in shortness. Patient had an Echo - there was no cardiac reason for the problem. Ms Qian reported that she has not had any of these symptoms, shortness of breath with palpations in a while- before Thanksgiving."

## 2020-03-31 ENCOUNTER — Ambulatory Visit (HOSPITAL_COMMUNITY): Payer: BC Managed Care – PPO | Admitting: Certified Registered Nurse Anesthetist

## 2020-03-31 ENCOUNTER — Ambulatory Visit (HOSPITAL_COMMUNITY)
Admission: RE | Admit: 2020-03-31 | Discharge: 2020-03-31 | Disposition: A | Discharge status: Home / Self Care | Payer: BC Managed Care – PPO | Attending: Pulmonary Disease | Admitting: Pulmonary Disease

## 2020-03-31 ENCOUNTER — Encounter (HOSPITAL_COMMUNITY): Payer: Self-pay | Admitting: Pulmonary Disease

## 2020-03-31 ENCOUNTER — Encounter (HOSPITAL_COMMUNITY)
Admission: RE | Disposition: A | Discharge status: Home / Self Care | Payer: Self-pay | Source: Home / Self Care | Attending: Pulmonary Disease

## 2020-03-31 ENCOUNTER — Other Ambulatory Visit: Payer: Self-pay

## 2020-03-31 DIAGNOSIS — E782 Mixed hyperlipidemia: Secondary | ICD-10-CM | POA: Diagnosis not present

## 2020-03-31 DIAGNOSIS — J189 Pneumonia, unspecified organism: Secondary | ICD-10-CM | POA: Diagnosis not present

## 2020-03-31 DIAGNOSIS — I10 Essential (primary) hypertension: Secondary | ICD-10-CM | POA: Diagnosis not present

## 2020-03-31 DIAGNOSIS — Z880 Allergy status to penicillin: Secondary | ICD-10-CM | POA: Diagnosis not present

## 2020-03-31 DIAGNOSIS — R599 Enlarged lymph nodes, unspecified: Secondary | ICD-10-CM | POA: Diagnosis present

## 2020-03-31 DIAGNOSIS — R590 Localized enlarged lymph nodes: Secondary | ICD-10-CM | POA: Diagnosis not present

## 2020-03-31 DIAGNOSIS — Z6841 Body Mass Index (BMI) 40.0 and over, adult: Secondary | ICD-10-CM | POA: Insufficient documentation

## 2020-03-31 DIAGNOSIS — Z20822 Contact with and (suspected) exposure to covid-19: Secondary | ICD-10-CM | POA: Diagnosis not present

## 2020-03-31 DIAGNOSIS — Z560 Unemployment, unspecified: Secondary | ICD-10-CM | POA: Diagnosis not present

## 2020-03-31 DIAGNOSIS — F418 Other specified anxiety disorders: Secondary | ICD-10-CM | POA: Diagnosis not present

## 2020-03-31 HISTORY — PX: BRONCHIAL NEEDLE ASPIRATION BIOPSY: SHX5106

## 2020-03-31 HISTORY — PX: BRONCHIAL WASHINGS: SHX5105

## 2020-03-31 HISTORY — PX: VIDEO BRONCHOSCOPY WITH ENDOBRONCHIAL ULTRASOUND: SHX6177

## 2020-03-31 LAB — COMPREHENSIVE METABOLIC PANEL
ALT: 22 U/L (ref 0–44)
AST: 31 U/L (ref 15–41)
Albumin: 4.1 g/dL (ref 3.5–5.0)
Alkaline Phosphatase: 68 U/L (ref 38–126)
Anion gap: 11 (ref 5–15)
BUN: 11 mg/dL (ref 6–20)
CO2: 22 mmol/L (ref 22–32)
Calcium: 8.8 mg/dL — ABNORMAL LOW (ref 8.9–10.3)
Chloride: 105 mmol/L (ref 98–111)
Creatinine, Ser: 0.8 mg/dL (ref 0.44–1.00)
GFR, Estimated: >60 mL/min (ref >60)
Glucose, Bld: 91 mg/dL (ref 70–99)
Potassium: 4 mmol/L (ref 3.5–5.1)
Sodium: 138 mmol/L (ref 135–145)
Total Bilirubin: 0.5 mg/dL (ref 0.3–1.2)
Total Protein: 7.5 g/dL (ref 6.5–8.1)

## 2020-03-31 LAB — CBC
HCT: 43.5 % (ref 36.0–46.0)
Hemoglobin: 13.5 g/dL (ref 12.0–15.0)
MCH: 26.8 pg (ref 26.0–34.0)
MCHC: 31 g/dL (ref 30.0–36.0)
MCV: 86.5 fL (ref 80.0–100.0)
Platelets: 188 10*3/uL (ref 150–400)
RBC: 5.03 MIL/uL (ref 3.87–5.11)
RDW: 12.9 % (ref 11.5–15.5)
WBC: 9.6 10*3/uL (ref 4.0–10.5)
nRBC: 0 % (ref 0.0–0.2)

## 2020-03-31 LAB — BODY FLUID CELL COUNT WITH DIFFERENTIAL
Eos, Fluid: 0 %
Lymphs, Fluid: 57 %
Monocyte-Macrophage-Serous Fluid: 4 % — ABNORMAL LOW (ref 50–90)
Neutrophil Count, Fluid: 39 % — ABNORMAL HIGH (ref 0–25)
Total Nucleated Cell Count, Fluid: 235 cu mm (ref 0–1000)

## 2020-03-31 SURGERY — BRONCHOSCOPY, WITH EBUS
Anesthesia: General

## 2020-03-31 MED ORDER — PROPOFOL 10 MG/ML IV BOLUS
INTRAVENOUS | Status: DC | PRN
  Administered 2020-03-31: 200 mg via INTRAVENOUS
  Administered 2020-03-31: 50 mg via INTRAVENOUS

## 2020-03-31 MED ORDER — CHLORHEXIDINE GLUCONATE 0.12 % MT SOLN: 15.0000 mL | Freq: Once | OROMUCOSAL | Status: AC

## 2020-03-31 MED ORDER — MIDAZOLAM HCL 2 MG/2ML IJ SOLN
INTRAMUSCULAR | Status: DC | PRN
  Administered 2020-03-31: 2 mg via INTRAVENOUS

## 2020-03-31 MED ORDER — ROCURONIUM BROMIDE 10 MG/ML (PF) SYRINGE
PREFILLED_SYRINGE | INTRAVENOUS | Status: DC | PRN
  Administered 2020-03-31: 100 mg via INTRAVENOUS

## 2020-03-31 MED ORDER — FENTANYL CITRATE (PF) 250 MCG/5ML IJ SOLN
INTRAMUSCULAR | Status: DC | PRN
  Administered 2020-03-31: 100 ug via INTRAVENOUS

## 2020-03-31 MED ORDER — SUGAMMADEX SODIUM 200 MG/2ML IV SOLN
INTRAVENOUS | Status: DC | PRN
  Administered 2020-03-31: 400 mg via INTRAVENOUS

## 2020-03-31 MED ORDER — MIDAZOLAM HCL 2 MG/2ML IJ SOLN
INTRAMUSCULAR | Status: AC
  Filled 2020-03-31: qty 2

## 2020-03-31 MED ORDER — ACETAMINOPHEN 500 MG PO TABS
1000.0000 mg | ORAL_TABLET | Freq: Once | ORAL | Status: DC
  Filled 2020-03-31: qty 2

## 2020-03-31 MED ORDER — PHENYLEPHRINE 40 MCG/ML (10ML) SYRINGE FOR IV PUSH (FOR BLOOD PRESSURE SUPPORT)
PREFILLED_SYRINGE | INTRAVENOUS | Status: DC | PRN
  Administered 2020-03-31: 160 ug via INTRAVENOUS

## 2020-03-31 MED ORDER — DEXAMETHASONE SODIUM PHOSPHATE 10 MG/ML IJ SOLN
INTRAMUSCULAR | Status: DC | PRN
  Administered 2020-03-31: 10 mg via INTRAVENOUS

## 2020-03-31 MED ORDER — LIDOCAINE 2% (20 MG/ML) 5 ML SYRINGE
INTRAMUSCULAR | Status: DC | PRN
  Administered 2020-03-31: 100 mg via INTRAVENOUS

## 2020-03-31 MED ORDER — ONDANSETRON HCL 4 MG/2ML IJ SOLN
INTRAMUSCULAR | Status: DC | PRN
  Administered 2020-03-31: 4 mg via INTRAVENOUS

## 2020-03-31 MED ORDER — LACTATED RINGERS IV SOLN: INTRAVENOUS | Status: DC

## 2020-03-31 MED ORDER — CHLORHEXIDINE GLUCONATE 0.12 % MT SOLN
OROMUCOSAL | Status: AC
  Administered 2020-03-31: 15 mL via OROMUCOSAL
  Filled 2020-03-31: qty 15

## 2020-03-31 MED ORDER — FENTANYL CITRATE (PF) 250 MCG/5ML IJ SOLN
INTRAMUSCULAR | Status: AC
  Filled 2020-03-31: qty 5

## 2020-03-31 SURGICAL SUPPLY — 29 items
BRUSH CYTOL CELLEBRITY 1.5X140 (MISCELLANEOUS) IMPLANT
CANISTER SUCT 3000ML PPV (MISCELLANEOUS) ×3 IMPLANT
CONT SPEC 4OZ CLIKSEAL STRL BL (MISCELLANEOUS) ×3 IMPLANT
COVER BACK TABLE 60X90IN (DRAPES) ×3 IMPLANT
COVER DOME SNAP 22 D (MISCELLANEOUS) ×3 IMPLANT
FORCEPS BIOP RJ4 1.8 (CUTTING FORCEPS) IMPLANT
GAUZE SPONGE 4X4 12PLY STRL (GAUZE/BANDAGES/DRESSINGS) ×3 IMPLANT
GLOVE BIO SURGEON STRL SZ7.5 (GLOVE) ×3 IMPLANT
GOWN STRL REUS W/ TWL LRG LVL3 (GOWN DISPOSABLE) ×2 IMPLANT
GOWN STRL REUS W/TWL LRG LVL3 (GOWN DISPOSABLE) ×3
KIT CLEAN ENDO COMPLIANCE (KITS) ×6 IMPLANT
KIT TURNOVER KIT B (KITS) ×3 IMPLANT
MARKER SKIN DUAL TIP RULER LAB (MISCELLANEOUS) ×3 IMPLANT
NEEDLE EBUS SONO TIP PENTAX (NEEDLE) ×3 IMPLANT
NS IRRIG 1000ML POUR BTL (IV SOLUTION) ×3 IMPLANT
OIL SILICONE PENTAX (PARTS (SERVICE/REPAIRS)) ×3 IMPLANT
PAD ARMBOARD 7.5X6 YLW CONV (MISCELLANEOUS) ×6 IMPLANT
SOL ANTI FOG 6CC (MISCELLANEOUS) ×2 IMPLANT
SOLUTION ANTI FOG 6CC (MISCELLANEOUS) ×1
SYR 20CC LL (SYRINGE) ×6 IMPLANT
SYR 20ML ECCENTRIC (SYRINGE) ×6 IMPLANT
SYR 50ML SLIP (SYRINGE) IMPLANT
SYR 5ML LUER SLIP (SYRINGE) ×3 IMPLANT
TOWEL OR 17X24 6PK STRL BLUE (TOWEL DISPOSABLE) ×3 IMPLANT
TRAP SPECIMEN MUCOUS 40CC (MISCELLANEOUS) IMPLANT
TUBE CONNECTING 20X1/4 (TUBING) ×6 IMPLANT
UNDERPAD 30X30 (UNDERPADS AND DIAPERS) ×3 IMPLANT
VALVE DISPOSABLE (MISCELLANEOUS) ×3 IMPLANT
WATER STERILE IRR 1000ML POUR (IV SOLUTION) ×3 IMPLANT

## 2020-03-31 NOTE — Transfer of Care (Signed)
Immediate Anesthesia Transfer of Care Note  Patient: Margaret Golden  Procedure(s) Performed: VIDEO BRONCHOSCOPY WITH ENDOBRONCHIAL ULTRASOUND (N/A ) BRONCHIAL NEEDLE ASPIRATION BIOPSIES BRONCHIAL WASHINGS  Patient Location: Endoscopy Unit  Anesthesia Type:General  Level of Consciousness: awake, alert  and patient cooperative  Airway & Oxygen Therapy: Patient Spontanous Breathing and Patient connected to face mask oxygen  Post-op Assessment: Report given to RN and Post -op Vital signs reviewed and stable  Post vital signs: Reviewed and stable  Last Vitals:  Vitals Value Taken Time  BP 137/78 03/31/20 1432  Temp    Pulse 95 03/31/20 1434  Resp 11 03/31/20 1434  SpO2 100 % 03/31/20 1434  Vitals shown include unvalidated device data.  Last Pain:  Vitals:   03/31/20 1110  TempSrc:   PainSc: 1       Patients Stated Pain Goal: 2 (03/31/20 1110)  Complications: No complications documented.

## 2020-03-31 NOTE — Consult Note (Signed)
Synopsis: Referred in December 2021 for [place], endobronchial ultrasound by Dr. Francine Graven  Subjective:   PATIENT ID: Margaret Golden GENDER: female DOB: 1990-06-28, MRN: [id number]   Per Dr. Francine Graven "Margaret Golden is a 29 year old woman, non-smoker with history of anemia, hypertension and obesity who is referred to pulmonary clinic for chest discomfort, shortness of breath and mediastinal lymphadenopathy.   She has developed chest discomfort and shortness of breath over the past few months. She was hospitalized in 06/2019 and found to be anemic due to heavy menses. She received blood transfusions which improved her shortness of breath. She has been followed by hematology for iron infusions and her most recent labs (11/2019) show her hemoglobin levels in the normal range. The heavy menses is thought secondary to a thickened endometrial lining which she had plans with OB/GYN for a procedure to thin the lining but was sent to cardiology for pre-procedure evaluation due to the chest discomfort and dyspnea. She reports palpitations and waking up short of breath at night. She is unable to lay on her back due to shortness of breath and she sleeps on her side.   She has been evaluated by cardiology and a cardiac CT was performed which was notable for mediastinal and hilar lymphadenopathy. A dedicated Chest CT with contrast was performed which shows mildly enlarged mediastinal and hilar lymph nodes along with multiple lung nodules. Right lower lobe 47mm nodule abuts the pleura. Left lower lobe 34mm nodule and a two contiguous nodules of the anterior right upper lobe measuring 7x71mm. EKG shows normal sinus rhythm with normal intervals. ECHO appears normal."  03/31/2020: Patient subsequently had follow-up CT imaging which revealed progression of mediastinal adenopathy.  Recent CT scan was completed on 03/15/2020.  This showed interval progression of the thoracic adenopathy within the chest concerning for underlying  inflammatory disorder, sarcoidosis or malignancy.  Recommended tissue sampling.  Patient was referred to me for evaluation of bronchoscopy.  Patient presents today to the hospital for video bronchoscopy with endobronchial ultrasound and transbronchial needle aspirations.  We discussed the risk benefits and alternatives proceeding with the procedure today.  Patient is agreeable to proceed.   Past Medical History:  Diagnosis Date  . Acid reflux   . ADD (attention deficit disorder)   . Anemia   . Anxiety   . Arthritis    left ankle  . Back pain   . Chest pain   . Depression   . Dyspnea    with exertion  . Exercise-induced asthma   . Fatty liver   . History of blood transfusion   . History of chicken pox   . History of kidney stones    passed   . Hypothyroidism   . Joint pain   . Lower extremity edema   . Major depressive disorder   . Migraines    chronic  . Palpitations   . Pneumonia   . RLS (restless legs syndrome)   . Sleep apnea 2021  . Thyroid disease    hypothyroid     Family History  Problem Relation Age of Onset  . Depression Mother   . Obesity Mother   . Depression Father   . Sleep apnea Father   . Obesity Father   . Cancer Paternal Aunt        colon  . Cancer Paternal Uncle        brain cancer  . Diabetes Maternal Grandfather   . Arthritis Maternal Grandfather   . Arthritis Maternal  Grandmother   . Breast cancer Maternal Grandmother   . Arthritis Paternal Grandmother   . Arthritis Paternal Grandfather   . Migraines Neg Hx      Past Surgical History:  Procedure Laterality Date  . DILATATION & CURETTAGE/HYSTEROSCOPY WITH MYOSURE N/A 03/22/2020   Procedure: DILATATION & CURETTAGE/HYSTEROSCOPY WITH MYOSURE;  Surgeon: Romualdo Bolk, MD;  Location: [facility] OR;  Service: Gynecology;  Laterality: N/A;  . INTRAUTERINE DEVICE (IUD) INSERTION N/A 03/22/2020   Procedure: INTRAUTERINE DEVICE (IUD) INSERTION;  Surgeon: Romualdo Bolk, MD;  Location: [facility] OR;   Service: Gynecology;  Laterality: N/A;  Mirena IUD insertion  . KNEE ARTHROSCOPY Right 2007  . TONSILLECTOMY  2010  . TONSILLECTOMY    . WISDOM TOOTH EXTRACTION    . WISDOM TOOTH EXTRACTION Bilateral    upper and lower    Social History   Socioeconomic History  . Marital status: Single    Spouse name: Not on file  . Number of children: 0  . Years of education: 50  . Highest education level: Not on file  Occupational History  . Occupation: Conservation officer, historic buildings  Tobacco Use  . Smoking status: Never Smoker  . Smokeless tobacco: Never Used  Vaping Use  . Vaping Use: Never used  Substance and Sexual Activity  . Alcohol use: Not Currently    Alcohol/week: 0.0 standard drinks  . Drug use: No  . Sexual activity: Never    Comment: never sexually active  Other Topics Concern  . Not on file  Social History Narrative   Unemployed   Lives alone --update 11/11/19   Seeking work   Only child   Dog 3   Lives in a one story house with father, has a cat-12/26/16-sjb      Caffeine: 2-4 cups/day   Social Determinants of Health   Financial Resource Strain: Not on file  Food Insecurity: Not on file  Transportation Needs: Not on file  Physical Activity: Not on file  Stress: Not on file  Social Connections: Not on file  Intimate Partner Violence: Not on file     Allergies  Allergen Reactions  . Augmentin [Amoxicillin-Pot Clavulanate] Hives     Review of Systems  Constitutional: Negative for chills, fever, malaise/fatigue and weight loss.  HENT: Negative for hearing loss, sore throat and tinnitus.   Eyes: Negative for blurred vision and double vision.  Respiratory: Positive for shortness of breath. Negative for cough, hemoptysis, sputum production, wheezing and stridor.   Cardiovascular: Negative for chest pain, palpitations, orthopnea, leg swelling and PND.  Gastrointestinal: Negative for abdominal pain, constipation, diarrhea, heartburn, nausea and vomiting.  Genitourinary:  Negative for dysuria, hematuria and urgency.  Musculoskeletal: Negative for joint pain and myalgias.  Skin: Negative for itching and rash.  Neurological: Negative for dizziness, tingling, weakness and headaches.  Endo/Heme/Allergies: Negative for environmental allergies. Does not bruise/bleed easily.  Psychiatric/Behavioral: Negative for depression. The patient is not nervous/anxious and does not have insomnia.   All other systems reviewed and are negative.    Objective:  Physical Exam Vitals reviewed.  Constitutional:      General: She is not in acute distress.    Appearance: She is well-developed and well-nourished. She is obese.  HENT:     Head: Normocephalic and atraumatic.     Mouth/Throat:     Mouth: Oropharynx is clear and moist.  Eyes:     General: No scleral icterus.    Conjunctiva/sclera: Conjunctivae normal.     Pupils: Pupils are equal, round,  and reactive to light.  Neck:     Vascular: No JVD.     Trachea: No tracheal deviation.  Cardiovascular:     Rate and Rhythm: Normal rate and regular rhythm.     Pulses: Intact distal pulses.     Heart sounds: Normal heart sounds. No murmur heard.   Pulmonary:     Effort: Pulmonary effort is normal. No tachypnea, accessory muscle usage or respiratory distress.     Breath sounds: Normal breath sounds. No stridor. No wheezing, rhonchi or rales.  Abdominal:     General: Bowel sounds are normal. There is no distension.     Palpations: Abdomen is soft.     Tenderness: There is no abdominal tenderness.     Comments: Obese pannus  Musculoskeletal:        General: No tenderness.     Cervical back: Neck supple.     Right lower leg: Edema present.     Left lower leg: Edema present.  Lymphadenopathy:     Cervical: No cervical adenopathy.  Skin:    General: Skin is warm and dry.     Capillary Refill: Capillary refill takes less than 2 seconds.     Findings: No rash.  Neurological:     Mental Status: She is alert and oriented  to person, place, and time.  Psychiatric:        Mood and Affect: Mood and affect normal.        Behavior: Behavior normal.      Vitals:   03/29/20 1232 03/31/20 1044  BP:  (!) 145/89  Pulse:  92  Resp:  18  Temp:  98.7 F (37.1 C)  TempSrc:  Oral  SpO2:  99%  Weight: (!) 165.6 kg (!) 165.6 kg  Height: 5' 7" (1.702 m) 5' 7" (1.702 m)   99% on RA BMI Readings from Last 3 Encounters:  03/31/20 57.17 kg/m  03/23/20 58.29 kg/m  03/22/20 57.17 kg/m   Wt Readings from Last 3 Encounters:  03/31/20 (!) 165.6 kg  03/23/20 (!) 168.8 kg  03/22/20 (!) 165.6 kg     CBC    Component Value Date/Time   WBC 9.6 03/31/2020 1154   RBC 5.03 03/31/2020 1154   HGB 13.5 03/31/2020 1154   HGB 14.5 01/25/2020 1319   HGB 9.3 (L) 08/26/2019 1045   HCT 43.5 03/31/2020 1154   HCT 33.4 (L) 08/26/2019 1045   PLT 188 03/31/2020 1154   PLT 194 01/25/2020 1319   PLT 234 08/26/2019 1045   MCV 86.5 03/31/2020 1154   MCV 72 (L) 08/26/2019 1045   MCH 26.8 03/31/2020 1154   MCHC 31.0 03/31/2020 1154   RDW 12.9 03/31/2020 1154   RDW 15.4 08/26/2019 1045   LYMPHSABS 2.1 01/25/2020 1319   MONOABS 0.5 01/25/2020 1319   EOSABS 0.3 01/25/2020 1319   BASOSABS 0.1 01/25/2020 1319     Chest Imaging: 03/15/2020: CT chest Bilateral mediastinal adenopathy.  Slightly progressed in comparison to previous images. The patient's images have been independently reviewed by me.    Pulmonary Functions Testing Results: No flowsheet data found.  FeNO: no  Pathology: no  Echocardiogram: no  Heart Catheterization: no    Assessment & Plan:   Mediastinal and hilar adenopathy.  Discussion:  Patient with progressive, slowly enlarging mediastinal and hilar adenopathy.  Discussed the various etiologies with the patient today at bedside.  We also discussed procedure in detail.  Patient is agreeable to proceed with video bronchoscopy with endobronchial ultrasound   transbronchial needle aspiration  biopsies.  We discussed the risk benefits and alternatives to include bleeding from biopsy location.  Patient is not on any antiplatelets or anticoagulation at baseline.    Current Facility-Administered Medications:  .  acetaminophen (TYLENOL) tablet 1,000 mg, 1,000 mg, Oral, Once, Marcene Duos, MD .  lactated ringers infusion, , Intravenous, Continuous, Lannie Fields, DO, Last Rate: 10 mL/hr at 03/31/20 1130, New Bag at 03/31/20 1130  I spent 45 minutes dedicated to the care of this patient on the date of this encounter to include pre-visit review of records, face-to-face time with the patient discussing conditions above, post visit ordering of testing, clinical documentation with the electronic health record, making appropriate referrals as documented, and communicating necessary findings to members of the patients care team.   Josephine Igo, DO [facility] 03/31/2020 12:32 PM

## 2020-03-31 NOTE — Anesthesia Preprocedure Evaluation (Addendum)
Anesthesia Evaluation  Patient identified by MRN, date of birth, ID band Patient awake    Reviewed: Allergy & Precautions, NPO status , Patient's Chart, lab work & pertinent test results, reviewed documented beta blocker date and time   Airway Mallampati: III  TM Distance: >3 FB Neck ROM: Full    Dental no notable dental hx. (+) Teeth Intact, Dental Advisory Given   Pulmonary shortness of breath and with exertion, asthma , sleep apnea and Continuous Positive Airway Pressure Ventilation ,  Mediastinal adenopathy- has been extensively worked up for SOB (neg cardiology and asthma/allergy w/u)- sent for CT chest- "Mild interval progression of thoracic adenopathy. This can be reactive, as can be seen in atypical infection such as mononucleosis, inflammatory, as can be seen with sarcoidosis, or lymphoproliferative in nature."   Pulmonary exam normal breath sounds clear to auscultation       Cardiovascular hypertension, Pt. on home beta blockers Normal cardiovascular exam Rhythm:Regular Rate:Normal     Neuro/Psych  Headaches, PSYCHIATRIC DISORDERS Anxiety Depression    GI/Hepatic GERD  Controlled and Medicated,Fatty liver   Endo/Other  Hypothyroidism Morbid obesityBMI 57  Renal/GU negative Renal ROS  negative genitourinary   Musculoskeletal  (+) Arthritis , Osteoarthritis,    Abdominal (+) + obese,   Peds  Hematology negative hematology ROS (+)   Anesthesia Other Findings   Reproductive/Obstetrics negative OB ROS IUD placed last week                           Anesthesia Physical Anesthesia Plan  ASA: III  Anesthesia Plan: General   Post-op Pain Management:    Induction: Intravenous  PONV Risk Score and Plan: 3 and Ondansetron, Dexamethasone, Midazolam and Treatment may vary due to age or medical condition  Airway Management Planned: Oral ETT  Additional Equipment: None  Intra-op Plan:    Post-operative Plan: Extubation in OR  Informed Consent: I have reviewed the patients History and Physical, chart, labs and discussed the procedure including the risks, benefits and alternatives for the proposed anesthesia with the patient or authorized representative who has indicated his/her understanding and acceptance.     Dental advisory given  Plan Discussed with:   Anesthesia Plan Comments:        Anesthesia Quick Evaluation

## 2020-03-31 NOTE — Anesthesia Procedure Notes (Signed)
Procedure Name: Intubation Date/Time: 03/31/2020 1:31 PM Performed by: Thelma Comp, CRNA Pre-anesthesia Checklist: Patient identified, Emergency Drugs available, Suction available and Patient being monitored Patient Re-evaluated:Patient Re-evaluated prior to induction Oxygen Delivery Method: Circle System Utilized Preoxygenation: Pre-oxygenation with 100% oxygen Induction Type: IV induction Ventilation: Mask ventilation without difficulty Laryngoscope Size: Mac and 4 Grade View: Grade I Tube type: Oral Tube size: 8.5 mm Number of attempts: 1 Airway Equipment and Method: Stylet Placement Confirmation: ETT inserted through vocal cords under direct vision,  positive ETCO2 and breath sounds checked- equal and bilateral Secured at: 21 cm Tube secured with: Tape Dental Injury: Teeth and Oropharynx as per pre-operative assessment

## 2020-03-31 NOTE — Discharge Instructions (Signed)
Flexible Bronchoscopy  Flexible bronchoscopy is a procedure that is used to examine the passageways in the lungs. During the procedure, a thin, flexible tool with a camera on it (bronchoscope) is passed into the mouth or nose, down through the windpipe (trachea), and into the air tubes (bronchi) in the lungs. This tool allows your health care provider to look at your lungs from the inside and take testing (diagnostic) samples if needed. Tell a health care provider about:  Any allergies you have.  All medicines you are taking, including vitamins, herbs, eye drops, creams, and over-the-counter medicines.  Any problems you or family members have had with anesthetic medicines.  Any blood disorders you have.  Any surgeries you have had.  Any medical conditions you have.  Whether you are pregnant or may be pregnant. What are the risks? Generally, this is a safe procedure. However, problems may occur, including:  Infection.  Bleeding.  Damage to other structures or organs.  Allergic reactions to medicines.  Collapsed lung (pneumothorax).  Increased need for oxygen or difficulty breathing after the procedure. What happens before the procedure? Medicines Ask your health care provider about:  Changing or stopping your regular medicines. This is especially important if you are taking diabetes medicines or blood thinners.  Taking medicines such as aspirin and ibuprofen. These medicines can thin your blood. Do not take these medicines before your procedure if your health care provider instructs you not to. You may be given antibiotic medicine to help prevent infection. Staying hydrated Follow instructions from your health care provider about hydration, which may include:  Up to 2 hours before the procedure - you may continue to drink clear liquids, such as water, clear fruit juice, black coffee, and plain tea. Eating and drinking Follow instructions from your health care provider  about eating and drinking, which may include:  8 hours before the procedure - stop eating heavy meals or foods such as meat, fried foods, or fatty foods.  6 hours before the procedure - stop eating light meals or foods, such as toast or cereal.  6 hours before the procedure - stop drinking milk or drinks that contain milk.  2 hours before the procedure - stop drinking clear liquids. General instructions  Plan to have someone take you home from the hospital or clinic.  If you will be going home right after the procedure, plan to have someone with you for 24 hours. What happens during the procedure?  To lower your risk of infection: ? Your health care team will wash or sanitize their hands. ? Your skin will be washed with soap.  An IV tube will be inserted into one of your veins.  You will be given a medicine (local anesthetic) to numb your mouth, nose, throat, and voice box (larynx). You may also be given one or more of the following: ? A medicine to help you relax (sedative). ? A medicine to control coughing. ? A medicine to dry up any fluids in your lungs (secretions).  A bronchoscope will be passed into your nose or mouth, and into your lungs. Your health care provider will examine your lungs.  Samples of airway secretions may be collected for testing.  If abnormal areas are seen in your airways, tissue samples may be removed for examination under a microscope (biopsy).  If tissue samples are needed from the outer parts of the lung, a type of X-ray (fluoroscopy) may be used to guide the bronchoscope to these areas.  If bleeding  occurs, you may be given medicine to stop or decrease the bleeding. The procedure may vary among health care providers and hospitals. What happens after the procedure?  Do not drive for 24 hours if you were given a sedative.  Your blood pressure, heart rate, breathing rate, and blood oxygen level will be monitored until the medicines you were given  have worn off.  You may have a chest X-ray to check for signs of pneumothorax.  You will not be allowed to eat or drink anything for 2 hours after your procedure.  If a biopsy was taken, it is up to you to get the results of your procedure. Ask your health care provider, or the department that is doing the procedure, when your results will be ready. Summary  Flexible bronchoscopy is a procedure that allows your health care provider to look closely at your lungs from the inside and take testing (diagnostic) samples if needed.  Risks of flexible bronchoscopy include bleeding, infection, and pneumothorax.  Before a flexible bronchoscopy, you will be given a medicine (local anesthetic) to numb your mouth, nose, throat, and voice box (larynx). Then, a bronchoscope will be passed into your nose or mouth, and into your lungs.  After the procedure, your blood pressure, heart rate, breathing rate, and blood oxygen level will be monitored until the medicines you were given have worn off. You may have a chest X-ray to check for signs of pneumothorax.  You will not be allowed to eat or drink anything for 2 hours after your procedure. This information is not intended to replace advice given to you by your health care provider. Make sure you discuss any questions you have with your health care provider. Document Revised: 03/01/2017 Document Reviewed: 04/21/2016 Elsevier Patient Education  [place]. Flexible Bronchoscopy, Care After This sheet gives you information about how to care for yourself after your test. Your doctor may also give you more specific instructions. If you have problems or questions, contact your doctor. Follow these instructions at home: Eating and drinking  The day after the test, go back to your normal diet. Driving  Do not drive for 24 hours if you were given a medicine to help you relax (sedative).  Do not drive or use heavy machinery while taking prescription pain  medicine. General instructions   Take over-the-counter and prescription medicines only as told by your doctor.  Return to your normal activities as told. Ask what activities are safe for you.  Do not use any products that have nicotine or tobacco in them. This includes cigarettes and e-cigarettes. If you need help quitting, ask your doctor.  Keep all follow-up visits as told by your doctor. This is important. It is very important if you had a tissue sample (biopsy) taken. Get help right away if:  You have shortness of breath that gets worse.  You get light-headed.  You feel like you are going to pass out (faint).  You have chest pain.  You cough up: ? More than a little blood. ? More blood than before. Summary  Do not eat or drink anything (not even water) for 2 hours after your test, or until your numbing medicine wears off.  Do not use cigarettes. Do not use e-cigarettes.  Get help right away if you have chest pain. This information is not intended to replace advice given to you by your health care provider. Make sure you discuss any questions you have with your health care provider. Document Revised:  03/01/2017 Document Reviewed: 04/06/2016 Elsevier Patient Education  [place].

## 2020-03-31 NOTE — H&P (View-Only) (Signed)
Synopsis: Referred in December 2021 for [place], endobronchial ultrasound by Dr. Francine Graven  Subjective:   PATIENT ID: Margaret Golden GENDER: female DOB: 1990-06-28, MRN: [id number]   Per Dr. Francine Graven "Margaret Golden is a 29 year old woman, non-smoker with history of anemia, hypertension and obesity who is referred to pulmonary clinic for chest discomfort, shortness of breath and mediastinal lymphadenopathy.   She has developed chest discomfort and shortness of breath over the past few months. She was hospitalized in 06/2019 and found to be anemic due to heavy menses. She received blood transfusions which improved her shortness of breath. She has been followed by hematology for iron infusions and her most recent labs (11/2019) show her hemoglobin levels in the normal range. The heavy menses is thought secondary to a thickened endometrial lining which she had plans with OB/GYN for a procedure to thin the lining but was sent to cardiology for pre-procedure evaluation due to the chest discomfort and dyspnea. She reports palpitations and waking up short of breath at night. She is unable to lay on her back due to shortness of breath and she sleeps on her side.   She has been evaluated by cardiology and a cardiac CT was performed which was notable for mediastinal and hilar lymphadenopathy. A dedicated Chest CT with contrast was performed which shows mildly enlarged mediastinal and hilar lymph nodes along with multiple lung nodules. Right lower lobe 47mm nodule abuts the pleura. Left lower lobe 34mm nodule and a two contiguous nodules of the anterior right upper lobe measuring 7x71mm. EKG shows normal sinus rhythm with normal intervals. ECHO appears normal."  03/31/2020: Patient subsequently had follow-up CT imaging which revealed progression of mediastinal adenopathy.  Recent CT scan was completed on 03/15/2020.  This showed interval progression of the thoracic adenopathy within the chest concerning for underlying  inflammatory disorder, sarcoidosis or malignancy.  Recommended tissue sampling.  Patient was referred to me for evaluation of bronchoscopy.  Patient presents today to the hospital for video bronchoscopy with endobronchial ultrasound and transbronchial needle aspirations.  We discussed the risk benefits and alternatives proceeding with the procedure today.  Patient is agreeable to proceed.   Past Medical History:  Diagnosis Date  . Acid reflux   . ADD (attention deficit disorder)   . Anemia   . Anxiety   . Arthritis    left ankle  . Back pain   . Chest pain   . Depression   . Dyspnea    with exertion  . Exercise-induced asthma   . Fatty liver   . History of blood transfusion   . History of chicken pox   . History of kidney stones    passed   . Hypothyroidism   . Joint pain   . Lower extremity edema   . Major depressive disorder   . Migraines    chronic  . Palpitations   . Pneumonia   . RLS (restless legs syndrome)   . Sleep apnea 2021  . Thyroid disease    hypothyroid     Family History  Problem Relation Age of Onset  . Depression Mother   . Obesity Mother   . Depression Father   . Sleep apnea Father   . Obesity Father   . Cancer Paternal Aunt        colon  . Cancer Paternal Uncle        brain cancer  . Diabetes Maternal Grandfather   . Arthritis Maternal Grandfather   . Arthritis Maternal  Grandmother   . Breast cancer Maternal Grandmother   . Arthritis Paternal Grandmother   . Arthritis Paternal Grandfather   . Migraines Neg Hx      Past Surgical History:  Procedure Laterality Date  . DILATATION & CURETTAGE/HYSTEROSCOPY WITH MYOSURE N/A 03/22/2020   Procedure: DILATATION & CURETTAGE/HYSTEROSCOPY WITH MYOSURE;  Surgeon: Romualdo Bolk, MD;  Location: [facility] OR;  Service: Gynecology;  Laterality: N/A;  . INTRAUTERINE DEVICE (IUD) INSERTION N/A 03/22/2020   Procedure: INTRAUTERINE DEVICE (IUD) INSERTION;  Surgeon: Romualdo Bolk, MD;  Location: [facility] OR;   Service: Gynecology;  Laterality: N/A;  Mirena IUD insertion  . KNEE ARTHROSCOPY Right 2007  . TONSILLECTOMY  2010  . TONSILLECTOMY    . WISDOM TOOTH EXTRACTION    . WISDOM TOOTH EXTRACTION Bilateral    upper and lower    Social History   Socioeconomic History  . Marital status: Single    Spouse name: Not on file  . Number of children: 0  . Years of education: 50  . Highest education level: Not on file  Occupational History  . Occupation: Conservation officer, historic buildings  Tobacco Use  . Smoking status: Never Smoker  . Smokeless tobacco: Never Used  Vaping Use  . Vaping Use: Never used  Substance and Sexual Activity  . Alcohol use: Not Currently    Alcohol/week: 0.0 standard drinks  . Drug use: No  . Sexual activity: Never    Comment: never sexually active  Other Topics Concern  . Not on file  Social History Narrative   Unemployed   Lives alone --update 11/11/19   Seeking work   Only child   Dog 3   Lives in a one story house with father, has a cat-12/26/16-sjb      Caffeine: 2-4 cups/day   Social Determinants of Health   Financial Resource Strain: Not on file  Food Insecurity: Not on file  Transportation Needs: Not on file  Physical Activity: Not on file  Stress: Not on file  Social Connections: Not on file  Intimate Partner Violence: Not on file     Allergies  Allergen Reactions  . Augmentin [Amoxicillin-Pot Clavulanate] Hives     Review of Systems  Constitutional: Negative for chills, fever, malaise/fatigue and weight loss.  HENT: Negative for hearing loss, sore throat and tinnitus.   Eyes: Negative for blurred vision and double vision.  Respiratory: Positive for shortness of breath. Negative for cough, hemoptysis, sputum production, wheezing and stridor.   Cardiovascular: Negative for chest pain, palpitations, orthopnea, leg swelling and PND.  Gastrointestinal: Negative for abdominal pain, constipation, diarrhea, heartburn, nausea and vomiting.  Genitourinary:  Negative for dysuria, hematuria and urgency.  Musculoskeletal: Negative for joint pain and myalgias.  Skin: Negative for itching and rash.  Neurological: Negative for dizziness, tingling, weakness and headaches.  Endo/Heme/Allergies: Negative for environmental allergies. Does not bruise/bleed easily.  Psychiatric/Behavioral: Negative for depression. The patient is not nervous/anxious and does not have insomnia.   All other systems reviewed and are negative.    Objective:  Physical Exam Vitals reviewed.  Constitutional:      General: She is not in acute distress.    Appearance: She is well-developed and well-nourished. She is obese.  HENT:     Head: Normocephalic and atraumatic.     Mouth/Throat:     Mouth: Oropharynx is clear and moist.  Eyes:     General: No scleral icterus.    Conjunctiva/sclera: Conjunctivae normal.     Pupils: Pupils are equal, round,  and reactive to light.  Neck:     Vascular: No JVD.     Trachea: No tracheal deviation.  Cardiovascular:     Rate and Rhythm: Normal rate and regular rhythm.     Pulses: Intact distal pulses.     Heart sounds: Normal heart sounds. No murmur heard.   Pulmonary:     Effort: Pulmonary effort is normal. No tachypnea, accessory muscle usage or respiratory distress.     Breath sounds: Normal breath sounds. No stridor. No wheezing, rhonchi or rales.  Abdominal:     General: Bowel sounds are normal. There is no distension.     Palpations: Abdomen is soft.     Tenderness: There is no abdominal tenderness.     Comments: Obese pannus  Musculoskeletal:        General: No tenderness.     Cervical back: Neck supple.     Right lower leg: Edema present.     Left lower leg: Edema present.  Lymphadenopathy:     Cervical: No cervical adenopathy.  Skin:    General: Skin is warm and dry.     Capillary Refill: Capillary refill takes less than 2 seconds.     Findings: No rash.  Neurological:     Mental Status: She is alert and oriented  to person, place, and time.  Psychiatric:        Mood and Affect: Mood and affect normal.        Behavior: Behavior normal.      Vitals:   03/29/20 1232 03/31/20 1044  BP:  (!) 145/89  Pulse:  92  Resp:  18  Temp:  98.7 F (37.1 C)  TempSrc:  Oral  SpO2:  99%  Weight: (!) 165.6 kg (!) 165.6 kg  Height: 5\' 7"  (1.702 m) 5\' 7"  (1.702 m)   99% on RA BMI Readings from Last 3 Encounters:  03/31/20 57.17 kg/m  03/23/20 58.29 kg/m  03/22/20 57.17 kg/m   Wt Readings from Last 3 Encounters:  03/31/20 (!) 165.6 kg  03/23/20 (!) 168.8 kg  03/22/20 (!) 165.6 kg     CBC    Component Value Date/Time   WBC 9.6 03/31/2020 1154   RBC 5.03 03/31/2020 1154   HGB 13.5 03/31/2020 1154   HGB 14.5 01/25/2020 1319   HGB 9.3 (L) 08/26/2019 1045   HCT 43.5 03/31/2020 1154   HCT 33.4 (L) 08/26/2019 1045   PLT 188 03/31/2020 1154   PLT 194 01/25/2020 1319   PLT 234 08/26/2019 1045   MCV 86.5 03/31/2020 1154   MCV 72 (L) 08/26/2019 1045   MCH 26.8 03/31/2020 1154   MCHC 31.0 03/31/2020 1154   RDW 12.9 03/31/2020 1154   RDW 15.4 08/26/2019 1045   LYMPHSABS 2.1 01/25/2020 1319   MONOABS 0.5 01/25/2020 1319   EOSABS 0.3 01/25/2020 1319   BASOSABS 0.1 01/25/2020 1319     Chest Imaging: 03/15/2020: CT chest Bilateral mediastinal adenopathy.  Slightly progressed in comparison to previous images. The patient's images have been independently reviewed by me.    Pulmonary Functions Testing Results: No flowsheet data found.  FeNO: no  Pathology: no  Echocardiogram: no  Heart Catheterization: no    Assessment & Plan:   Mediastinal and hilar adenopathy.  Discussion:  Patient with progressive, slowly enlarging mediastinal and hilar adenopathy.  Discussed the various etiologies with the patient today at bedside.  We also discussed procedure in detail.  Patient is agreeable to proceed with video bronchoscopy with endobronchial ultrasound  transbronchial needle aspiration  biopsies.  We discussed the risk benefits and alternatives to include bleeding from biopsy location.  Patient is not on any antiplatelets or anticoagulation at baseline.    Current Facility-Administered Medications:  .  acetaminophen (TYLENOL) tablet 1,000 mg, 1,000 mg, Oral, Once, Marcene Duos, MD .  lactated ringers infusion, , Intravenous, Continuous, Lannie Fields, DO, Last Rate: 10 mL/hr at 03/31/20 1130, New Bag at 03/31/20 1130  I spent 45 minutes dedicated to the care of this patient on the date of this encounter to include pre-visit review of records, face-to-face time with the patient discussing conditions above, post visit ordering of testing, clinical documentation with the electronic health record, making appropriate referrals as documented, and communicating necessary findings to members of the patients care team.   Josephine Igo, DO [facility] 03/31/2020 12:32 PM

## 2020-03-31 NOTE — Interval H&P Note (Signed)
History and Physical Interval Note:  03/31/2020 12:50 PM  Margaret Golden  has presented today for surgery, with the diagnosis of mediastinal adenopathy.  The various methods of treatment have been discussed with the patient and family. After consideration of risks, benefits and other options for treatment, the patient has consented to  Procedure(s): VIDEO BRONCHOSCOPY WITH ENDOBRONCHIAL ULTRASOUND (N/A) as a surgical intervention.  The patient's history has been reviewed, patient examined, no change in status, stable for surgery.  I have reviewed the patient's chart and labs.  Questions were answered to the patient's satisfaction.     [name]

## 2020-03-31 NOTE — Anesthesia Postprocedure Evaluation (Signed)
Anesthesia Post Note  Patient: Margaret Golden  Procedure(s) Performed: VIDEO BRONCHOSCOPY WITH ENDOBRONCHIAL ULTRASOUND (N/A ) BRONCHIAL NEEDLE ASPIRATION BIOPSIES BRONCHIAL WASHINGS     Patient location during evaluation: PACU Anesthesia Type: General Level of consciousness: sedated and patient cooperative Pain management: pain level controlled Vital Signs Assessment: post-procedure vital signs reviewed and stable Respiratory status: spontaneous breathing Cardiovascular status: stable Anesthetic complications: no   No complications documented.  Last Vitals:  Vitals:   03/31/20 1440 03/31/20 1450  BP: 124/70 133/73  Pulse: 91 91  Resp: 14 12  Temp:    SpO2: 100% 100%    Last Pain:  Vitals:   03/31/20 1450  TempSrc:   PainSc: 0-No pain                 Lewie Loron

## 2020-03-31 NOTE — Op Note (Signed)
Video Bronchoscopy with Endobronchial Ultrasound Procedure Note  Date of Operation: 03/31/2020  Pre-op Diagnosis: Mediastinal and hilar adenopathy  Post-op Diagnosis: Mediastinal and hilar adenopathy  Surgeon: Garner Nash, DO  Assistants: None  Anesthesia: General endotracheal anesthesia  Operation: Flexible video fiberoptic bronchoscopy with endobronchial ultrasound and biopsies.  Estimated Blood Loss: Minimal, less than 1 cc  Complications: None   Indications and History: Margaret Golden is a 29 y.o. female with enlarging mediastinal and hilar adenopathy.  The risks, benefits, complications, treatment options and expected outcomes were discussed with the patient.  The possibilities of pneumothorax, pneumonia, reaction to medication, pulmonary aspiration, perforation of a viscus, bleeding, failure to diagnose a condition and creating a complication requiring transfusion or operation were discussed with the patient who freely signed the consent.    Description of Procedure: The patient was examined in the preoperative area and history and data from the preprocedure consultation were reviewed. It was deemed appropriate to proceed.  The patient was taken to [facility] endoscopy room 1, identified as Margaret Golden and the procedure verified as Flexible Video Fiberoptic Bronchoscopy.  A Time Out was held and the above information confirmed. After being taken to the operating room general anesthesia was initiated and the patient  was orally intubated. The video fiberoptic bronchoscope was introduced via the endotracheal tube and a general inspection was performed which showed normal right and left lung anatomy with no evidence of endobronchial lesion. The standard scope was then withdrawn and the endobronchial ultrasound was used to identify and characterize the peritracheal, hilar and bronchial lymph nodes. Inspection showed enlarged bilateral hilar nodes as well as enlarged station 7 node, the  4R node looked enlarged but had a fatty center under ultrasound. Using real-time ultrasound guidance Wang needle biopsies were take from Station station 7 nodes and were sent for cytology. The patient tolerated the procedure well without apparent complications.  Standard scope was inserted into the airway.  Bilateral mainstem's were aspirated with removal of blood clots and secretions from both locations all distal subsegments were patent.  There is no evidence of active bleeding from the needle penetration site.  A BAL was obtained from the right middle lobe to be sent for cultures, CD4 CD8 ratio, cell count and differential.  Bronchoscope was brought to just above the main carina there was no evidence of active bleeding.  There was no significant blood loss. The bronchoscope was withdrawn. Anesthesia was reversed and the patient was taken to the PACU for recovery.   Samples: 1. Wang needle biopsies from station 7 node 2.  Bronchoalveolar lavage of right middle lobe  Plans:  The patient will be discharged from the PACU to home when recovered from anesthesia. We will review the cytology, pathology and microbiology results with the patient when they become available. Outpatient followup will be with Dr. Erin Fulling.   Garner Nash, DO [facility] 03/31/2020 2:30 PM

## 2020-04-01 LAB — ACID FAST SMEAR (AFB, MYCOBACTERIA): Acid Fast Smear: NEGATIVE

## 2020-04-02 LAB — CULTURE, RESPIRATORY W GRAM STAIN: Culture: NO GROWTH

## 2020-04-06 LAB — CYTOLOGY - NON PAP

## 2020-04-06 LAB — SURGICAL PATHOLOGY

## 2020-04-07 ENCOUNTER — Telehealth: Payer: Self-pay | Admitting: Pulmonary Disease

## 2020-04-07 DIAGNOSIS — R590 Localized enlarged lymph nodes: Secondary | ICD-10-CM

## 2020-04-07 NOTE — Telephone Encounter (Signed)
I spoke with patient and informed her that Dr. Tonia Brooms and I have discussed her biopsy results and feel the next best step would be to move forward with PET/CT scan for her mediastinal lymphadenopathy. She is ok with moving forward with this. Can you please order this scan and have it scheduled in the first week of February.  Thanks, Margaret Golden

## 2020-04-11 DIAGNOSIS — F4323 Adjustment disorder with mixed anxiety and depressed mood: Secondary | ICD-10-CM | POA: Diagnosis not present

## 2020-04-12 DIAGNOSIS — F4323 Adjustment disorder with mixed anxiety and depressed mood: Secondary | ICD-10-CM | POA: Diagnosis not present

## 2020-04-12 NOTE — Telephone Encounter (Signed)
Order has been placed for PET scan.  

## 2020-04-13 ENCOUNTER — Ambulatory Visit: Payer: BC Managed Care – PPO | Admitting: Obstetrics and Gynecology

## 2020-04-13 NOTE — Progress Notes (Deleted)
GYNECOLOGY  VISIT   HPI: 30 y.o.   Single White or Caucasian Not Hispanic or Latino  female   [id number] with No LMP recorded.   here for post op.   DILATATION & CURETTAGE/HYSTEROSCOPY WITH MYOSURE (N/A ) INTRAUTERINE DEVICE (IUD) INSERTION (N/A )   GYNECOLOGIC HISTORY: No LMP recorded. Contraception:none Menopausal hormone therapy: n/a        OB History    Gravida  0   Para  0   Term  0   Preterm  0   AB  0   Living  0     SAB  0   IAB  0   Ectopic  0   Multiple  0   Live Births  0              Patient Active Problem List   Diagnosis Date Noted  . Adenopathy 03/31/2020  . Other allergic rhinitis 03/23/2020  . Adverse reaction to food, subsequent encounter 03/23/2020  . Shortness of breath 03/23/2020  . Heartburn 03/23/2020  . Drug reaction 03/23/2020  . Acid reflux   . ADD (attention deficit disorder)   . Anemia   . Anxiety   . Arthritis   . Back pain   . Chest pain   . Dyspnea   . Exercise-induced asthma   . Fatty liver   . History of blood transfusion   . History of chicken pox   . History of kidney stones   . Joint pain   . Lower extremity edema   . Major depressive disorder   . Pneumonia   . Thyroid disease   . Sleep apnea 02/23/2020  . History of iron deficiency anemia 02/23/2020  . BMI 50.0-59.9, adult (HCC) 02/23/2020  . Mediastinal lymphadenopathy 12/23/2019  . Pulmonary nodules 12/23/2019  . RLS (restless legs syndrome) 12/21/2019  . Super obesity 12/21/2019  . Papilledema 12/21/2019  . Obesity with alveolar hypoventilation and body mass index (BMI) of 40 or greater (HCC) 12/21/2019  . Morning headache 12/21/2019  . NSVT (nonsustained ventricular tachycardia) (HCC) 12/09/2019  . Mixed hyperlipidemia 12/09/2019  . Palpitations 12/09/2019  . IDA (iron deficiency anemia) 10/20/2019  . Hematometra 07/15/2019  . Menorrhagia with regular cycle 07/15/2019  . Symptomatic anemia 04/07/2019  . Social phobia 02/26/2018  . Major  depressive disorder, recurrent episode, mild (HCC) 02/26/2018  . Insomnia 02/26/2018  . Left ankle pain 02/28/2017  . Class 3 severe obesity due to excess calories with serious comorbidity in adult (HCC) 05/11/2016  . Fatty liver disease, nonalcoholic 02/02/2015  . Depression 01/03/2015  . Hypothyroidism 01/03/2015  . GERD (gastroesophageal reflux disease) 01/03/2015  . Migraines 01/03/2015  . Essential hypertension 06/02/2014    Past Medical History:  Diagnosis Date  . Acid reflux   . ADD (attention deficit disorder)   . Anemia   . Anxiety   . Arthritis    left ankle  . Back pain   . Chest pain   . Depression   . Dyspnea    with exertion  . Exercise-induced asthma   . Fatty liver   . History of blood transfusion   . History of chicken pox   . History of kidney stones    passed   . Hypothyroidism   . Joint pain   . Lower extremity edema   . Major depressive disorder   . Migraines    chronic  . Palpitations   . Pneumonia   . RLS (restless legs syndrome)   .  Sleep apnea 2021  . Thyroid disease    hypothyroid    Past Surgical History:  Procedure Laterality Date  . BRONCHIAL NEEDLE ASPIRATION BIOPSY  03/31/2020   Procedure: BRONCHIAL NEEDLE ASPIRATION BIOPSIES;  Surgeon: Josephine Igo, DO;  Location: [facility];  Service: Pulmonary;;  . BRONCHIAL WASHINGS  03/31/2020   Procedure: BRONCHIAL WASHINGS;  Surgeon: Josephine Igo, DO;  Location: [facility];  Service: Pulmonary;;  . DILATATION & CURETTAGE/HYSTEROSCOPY WITH MYOSURE N/A 03/22/2020   Procedure: DILATATION & CURETTAGE/HYSTEROSCOPY WITH MYOSURE;  Surgeon: Romualdo Bolk, MD;  Location: [facility] OR;  Service: Gynecology;  Laterality: N/A;  . INTRAUTERINE DEVICE (IUD) INSERTION N/A 03/22/2020   Procedure: INTRAUTERINE DEVICE (IUD) INSERTION;  Surgeon: Romualdo Bolk, MD;  Location: [facility] OR;  Service: Gynecology;  Laterality: N/A;  Mirena IUD insertion  . KNEE ARTHROSCOPY Right 2007  . TONSILLECTOMY   2010  . TONSILLECTOMY    . VIDEO BRONCHOSCOPY WITH ENDOBRONCHIAL ULTRASOUND N/A 03/31/2020   Procedure: VIDEO BRONCHOSCOPY WITH ENDOBRONCHIAL ULTRASOUND;  Surgeon: Josephine Igo, DO;  Location: [facility];  Service: Pulmonary;  Laterality: N/A;  . WISDOM TOOTH EXTRACTION    . WISDOM TOOTH EXTRACTION Bilateral    upper and lower    Current Outpatient Medications  Medication Sig Dispense Refill  . albuterol (VENTOLIN HFA) 108 (90 Base) MCG/ACT inhaler Inhale 2 puffs into the lungs every 6 (six) hours as needed for wheezing or shortness of breath (Chest tightness). 18 g 0  . aspirin-acetaminophen-caffeine (EXCEDRIN MIGRAINE) 250-250-65 MG tablet Take 2 tablets by mouth daily as needed for headache or migraine.    . furosemide (LASIX) 20 MG tablet Take 1 tablet (20 mg total) by mouth 2 (two) times a week. 26 tablet 0  . ibuprofen (ADVIL) 800 MG tablet Take 1 tablet (800 mg total) by mouth every 8 (eight) hours as needed. 30 tablet 0  . Iron-FA-B Cmp-C-Biot-Probiotic (FUSION PLUS) CAPS One capsule twice daily with orange juice. Stop your Ferrous sulfate. (Patient taking differently: Take 1 capsule by mouth 2 (two) times daily. with orange juice. Stop your Ferrous sulfate.) 30 capsule 1  . lamoTRIgine (LAMICTAL) 200 MG tablet Take 1 tablet (200 mg total) by mouth daily. 30 tablet 3  . levothyroxine (SYNTHROID) 100 MCG tablet Take 100 mcg by mouth daily before breakfast. Take with the 112 mcg dose to equal 212 mcg daily    . levothyroxine (SYNTHROID) 112 MCG tablet Take 112 mcg by mouth daily before breakfast. Take with the 100 mcg dose to equal 212 mcg daily    . LORazepam (ATIVAN) 0.5 MG tablet Take 1 tablet (0.5 mg total) by mouth every 8 (eight) hours. (Patient taking differently: Take 0.5 mg by mouth every 8 (eight) hours as needed for anxiety.) 30 tablet 1  . Multiple Vitamins-Minerals (MULTIVITAMIN WITH MINERALS) tablet Take 1 tablet by mouth daily.    Marland Kitchen omeprazole (PRILOSEC) 20 MG capsule  Take 2 capsules (40 mg total) by mouth daily. 180 capsule 2  . potassium chloride SA (KLOR-CON) 20 MEQ tablet Take 1 tablet (20 mEq total) by mouth 2 (two) times a week. 26 tablet 0  . propranolol (INDERAL) 20 MG tablet Take 1 tablet (20 mg total) by mouth 3 (three) times daily. 270 tablet 3  . sertraline (ZOLOFT) 100 MG tablet Take 2 tablets (200 mg total) by mouth daily. 90 tablet 1  . topiramate (TOPAMAX) 50 MG tablet Start with 50mg (1 tab) at bedtime. In 1 week increase to 100mg (2 tabs) at bedtime. (  Patient taking differently: Take 100 mg by mouth at bedtime.) 180 tablet 6  . traZODone (DESYREL) 50 MG tablet Take 1/2-2 po QHS prn insomnia (Patient taking differently: Take 25-100 mg by mouth at bedtime as needed for sleep.) 60 tablet 1  . Vitamin D, Ergocalciferol, (DRISDOL) 1.25 MG (50000 UNIT) CAPS capsule Take 1 capsule (50,000 Units total) by mouth every 7 (seven) days. (Patient taking differently: Take 50,000 Units by mouth every Monday.) 4 capsule 0   No current facility-administered medications for this visit.     ALLERGIES: Augmentin [amoxicillin-pot clavulanate]  Family History  Problem Relation Age of Onset  . Depression Mother   . Obesity Mother   . Depression Father   . Sleep apnea Father   . Obesity Father   . Cancer Paternal Aunt        colon  . Cancer Paternal Uncle        brain cancer  . Diabetes Maternal Grandfather   . Arthritis Maternal Grandfather   . Arthritis Maternal Grandmother   . Breast cancer Maternal Grandmother   . Arthritis Paternal Grandmother   . Arthritis Paternal Grandfather   . Migraines Neg Hx     Social History   Socioeconomic History  . Marital status: Single    Spouse name: Not on file  . Number of children: 0  . Years of education: 83  . Highest education level: Not on file  Occupational History  . Occupation: Conservation officer, historic buildings  Tobacco Use  . Smoking status: Never Smoker  . Smokeless tobacco: Never Used  Vaping Use  .  Vaping Use: Never used  Substance and Sexual Activity  . Alcohol use: Not Currently    Alcohol/week: 0.0 standard drinks  . Drug use: No  . Sexual activity: Never    Comment: never sexually active  Other Topics Concern  . Not on file  Social History Narrative   Unemployed   Lives alone --update 11/11/19   Seeking work   Only child   Dog 3   Lives in a one story house with father, has a cat-12/26/16-sjb      Caffeine: 2-4 cups/day   Social Determinants of Corporate investment banker Strain: Not on file  Food Insecurity: Not on file  Transportation Needs: Not on file  Physical Activity: Not on file  Stress: Not on file  Social Connections: Not on file  Intimate Partner Violence: Not on file    ROS  PHYSICAL EXAMINATION:    There were no vitals taken for this visit.    General appearance: alert, cooperative and appears stated age Neck: no adenopathy, supple, symmetrical, trachea midline and thyroid {CHL AMB PHY EX THYROID NORM DEFAULT:[phone]::"normal to inspection and palpation"} Breasts: {Exam; breast:13139::"normal appearance, no masses or tenderness"} Abdomen: soft, non-tender; non distended, no masses,  no organomegaly  Pelvic: External genitalia:  no lesions              Urethra:  normal appearing urethra with no masses, tenderness or lesions              Bartholins and Skenes: normal                 Vagina: normal appearing vagina with normal color and discharge, no lesions              Cervix: {CHL AMB PHY EX CERVIX NORM [phone]::"no lesions"}              Bimanual Exam:  Uterus:  {CHL  AMB PHY EX UTERUS NORM DEFAULT:[phone]::"normal size, contour, position, consistency, mobility, non-tender"}              Adnexa: {CHL AMB PHY EX ADNEXA NO MASS DEFAULT:[phone]::"no mass, fullness, tenderness"}              Rectovaginal: {yes no:[id number]}.  Confirms.              Anus:  normal sphincter tone, no lesions  Chaperone was present for  exam.  ASSESSMENT     PLAN    An After Visit Summary was printed and given to the patient.  *** minutes face to face time of which over 50% was spent in counseling.

## 2020-04-18 DIAGNOSIS — F4323 Adjustment disorder with mixed anxiety and depressed mood: Secondary | ICD-10-CM | POA: Diagnosis not present

## 2020-04-21 ENCOUNTER — Encounter: Payer: Self-pay | Admitting: Psychiatry

## 2020-04-21 ENCOUNTER — Telehealth (INDEPENDENT_AMBULATORY_CARE_PROVIDER_SITE_OTHER): Payer: BC Managed Care – PPO | Admitting: Psychiatry

## 2020-04-21 DIAGNOSIS — F4010 Social phobia, unspecified: Secondary | ICD-10-CM

## 2020-04-21 DIAGNOSIS — F339 Major depressive disorder, recurrent, unspecified: Secondary | ICD-10-CM

## 2020-04-21 DIAGNOSIS — F900 Attention-deficit hyperactivity disorder, predominantly inattentive type: Secondary | ICD-10-CM | POA: Diagnosis not present

## 2020-04-21 DIAGNOSIS — F411 Generalized anxiety disorder: Secondary | ICD-10-CM

## 2020-04-21 DIAGNOSIS — G4730 Sleep apnea, unspecified: Secondary | ICD-10-CM | POA: Diagnosis not present

## 2020-04-21 LAB — CULTURE, FUNGUS WITHOUT SMEAR

## 2020-04-21 MED ORDER — LAMOTRIGINE 200 MG PO TABS: 200.0000 mg | ORAL_TABLET | Freq: Every day | ORAL | 0 refills | Status: DC

## 2020-04-21 MED ORDER — LORAZEPAM 0.5 MG PO TABS
0.5000 mg | ORAL_TABLET | Freq: Three times a day (TID) | ORAL | 1 refills | Status: DC | PRN
Start: 2020-04-21 — End: 2020-11-02

## 2020-04-21 MED ORDER — MODAFINIL 200 MG PO TABS: ORAL_TABLET | ORAL | 2 refills | Status: DC

## 2020-04-21 MED ORDER — SERTRALINE HCL 100 MG PO TABS: 200.0000 mg | ORAL_TABLET | Freq: Every day | ORAL | 1 refills | Status: DC

## 2020-04-21 NOTE — Progress Notes (Signed)
Margaret Golden [id number] 02/06/1991 30 y.o.  Virtual Visit via Video Note  I connected with pt @ on 04/21/20 at 11:00 AM EST by a video enabled telemedicine application and verified that I am speaking with the correct person using two identifiers.   I discussed the limitations of evaluation and management by telemedicine and the availability of in person appointments. The patient expressed understanding and agreed to proceed.  I discussed the assessment and treatment plan with the patient. The patient was provided an opportunity to ask questions and all were answered. The patient agreed with the plan and demonstrated an understanding of the instructions.   The patient was advised to call back or seek an in-person evaluation if the symptoms worsen or if the condition fails to improve as anticipated.  I provided 30 minutes of non-face-to-face time during this encounter.  The patient was located at home.  [facility]   Corie ChiquitoJessica Britney Newstrom, PMHNP   Subjective:   Patient ID:  Margaret Golden is a 30 y.o. (DOB 04/10/1990) female.  Chief Complaint:  Chief Complaint  Patient presents with   Depression   Anxiety    HPI Margaret Famaylor A Mcnew presents for follow-up of depression and anxiety. She reports that she has been having "some ups and downs but doing ok." She reports that she continues to have chest pain. She had COVID earlier this month.   She had some anxiety this morning when technology was not working. She reports that she continues to have some generalized anxiety, mostly related to her health. Denies panic attacks. Notices some anxiety around people. She reports that her mood has been "more on the depressed side but I think that's seasonal." Energy has been lower. Motivation has also been lower with occasional increases at night. She reports sleeping about 6 hours a night most nights. Difficulty falling asleep and stays asleep once she falls asleep.  Appetite has been up and down the last few weeks. Has been intentionally losing weight. She reports worsening concentration. She reports difficulty completing tasks due to poor concentration. Has difficulty focusing on things she enjoys. Denies anhedonia. Continues to stay in touch with friends. Father has been in to help her through procedures. Denies SI.   Reports that she experiences some seasonal depression.   Sees therapist regularly.   Has used the Ativan prn with good response. She reports using Ativan prn infrequently, such as appointments with specialists. Takes Trazodone prn only since it causes excessive grogginess.   Father joins video visit at conclusion of exam. Father reports concerns about her depression. He reports the her energy and motivation have been low. He reports that she is not completing housework and chores. He reports that she is sleeping about 10-12 hours a night. He reports that she has needed encouragement to maintain hygiene and showering. He reports that her anxiety has been "holding steady."   Past medication trials: Sertraline-helpful for depression and anxiety Wellbutrin- initially helpful and then was less helpful Lexapro-ineffective Cymbalta Pristiq Seroquel- does not recall any significant improvement Latuda Abilify Propranolol- prescribed for HR. Not helpful for anxiety Melatonin Vyvanse Adderall XR- not effective Concerta- Notices duration only lasts about 4 hours. No significant improvement with doses less than 54 mg. Marnee Springdhansia- May have caused n/v and poor concentration Horizant Lamictal-effective for mood Trazodone-effective for insomnia but causes some excessive drowsiness Rexulti- May have caused n/v and poor concentration Ativan- helpful   Review of Systems:  Review of Systems  Respiratory:  Positive for shortness of breath.   Cardiovascular: Positive for chest pain.       Chest tightness.   Musculoskeletal: Positive for arthralgias.  Negative for gait problem.  Skin: Negative for rash.  Neurological: Negative for tremors.  Psychiatric/Behavioral:       Please refer to HPI  Reports that she is being evaluated for autoimmune disorders.   Dx'd with sleep apnea and is awaiting follow-up to determine cPap  Medications: I have reviewed the patient's current medications.  Current Outpatient Medications  Medication Sig Dispense Refill   aspirin-acetaminophen-caffeine (EXCEDRIN MIGRAINE) 250-250-65 MG tablet Take 2 tablets by mouth daily as needed for headache or migraine.     ibuprofen (ADVIL) 800 MG tablet Take 1 tablet (800 mg total) by mouth every 8 (eight) hours as needed. 30 tablet 0   Iron-FA-B Cmp-C-Biot-Probiotic (FUSION PLUS) CAPS One capsule twice daily with orange juice. Stop your Ferrous sulfate. (Patient taking differently: Take 1 capsule by mouth 2 (two) times daily. with orange juice. Stop your Ferrous sulfate.) 30 capsule 1   levothyroxine (SYNTHROID) 100 MCG tablet Take 100 mcg by mouth daily before breakfast. Take with the 112 mcg dose to equal 212 mcg daily     levothyroxine (SYNTHROID) 112 MCG tablet Take 112 mcg by mouth daily before breakfast. Take with the 100 mcg dose to equal 212 mcg daily     modafinil (PROVIGIL) 200 MG tablet Take 1/2-1 tablet in the morning 30 tablet 2   Multiple Vitamins-Minerals (MULTIVITAMIN WITH MINERALS) tablet Take 1 tablet by mouth daily.     omeprazole (PRILOSEC) 20 MG capsule Take 2 capsules (40 mg total) by mouth daily. 180 capsule 2   potassium chloride SA (KLOR-CON) 20 MEQ tablet Take 1 tablet (20 mEq total) by mouth 2 (two) times a week. 26 tablet 0   propranolol (INDERAL) 20 MG tablet Take 1 tablet (20 mg total) by mouth 3 (three) times daily. 270 tablet 3   topiramate (TOPAMAX) 50 MG tablet Start with 50mg (1 tab) at bedtime. In 1 week increase to 100mg (2 tabs) at bedtime. (Patient taking differently: Take 100 mg by mouth at bedtime.) 180 tablet 6   traZODone  (DESYREL) 50 MG tablet Take 1/2-2 po QHS prn insomnia (Patient taking differently: Take 25-100 mg by mouth at bedtime as needed for sleep.) 60 tablet 1   Vitamin D, Ergocalciferol, (DRISDOL) 1.25 MG (50000 UNIT) CAPS capsule Take 1 capsule (50,000 Units total) by mouth every 7 (seven) days. (Patient taking differently: Take 50,000 Units by mouth every Monday.) 4 capsule 0   albuterol (VENTOLIN HFA) 108 (90 Base) MCG/ACT inhaler Inhale 2 puffs into the lungs every 6 (six) hours as needed for wheezing or shortness of breath (Chest tightness). (Patient not taking: Reported on 04/21/2020) 18 g 0   furosemide (LASIX) 20 MG tablet Take 1 tablet (20 mg total) by mouth 2 (two) times a week. 26 tablet 0   lamoTRIgine (LAMICTAL) 200 MG tablet Take 1 tablet (200 mg total) by mouth daily. 90 tablet 0   LORazepam (ATIVAN) 0.5 MG tablet Take 1 tablet (0.5 mg total) by mouth every 8 (eight) hours as needed for anxiety. 30 tablet 1   sertraline (ZOLOFT) 100 MG tablet Take 2 tablets (200 mg total) by mouth daily. 180 tablet 1   No current facility-administered medications for this visit.    Medication Side Effects: None  Allergies:  Allergies  Allergen Reactions   Augmentin [Amoxicillin-Pot Clavulanate] Hives    Past Medical History:  Diagnosis  Date   Acid reflux    ADD (attention deficit disorder)    Anemia    Anxiety    Arthritis    left ankle   Back pain    Chest pain    Depression    Dyspnea    with exertion   Exercise-induced asthma    Fatty liver    History of blood transfusion    History of chicken pox    History of kidney stones    passed    Hypothyroidism    Joint pain    Lower extremity edema    Major depressive disorder    Migraines    chronic   Palpitations    Pneumonia    RLS (restless legs syndrome)    Sleep apnea 2021   Thyroid disease    hypothyroid    Family History  Problem Relation Age of Onset   Depression Mother    Obesity  Mother    Depression Father    Sleep apnea Father    Obesity Father    Cancer Paternal Aunt        colon   Cancer Paternal Uncle        brain cancer   Diabetes Maternal Grandfather    Arthritis Maternal Grandfather    Arthritis Maternal Grandmother    Breast cancer Maternal Grandmother    Arthritis Paternal Grandmother    Arthritis Paternal Grandfather    Migraines Neg Hx     Social History   Socioeconomic History   Marital status: Single    Spouse name: Not on file   Number of children: 0   Years of education: 12   Highest education level: Not on file  Occupational History   Occupation: Conservation officer, historic buildings  Tobacco Use   Smoking status: Never Smoker   Smokeless tobacco: Never Used  Building services engineer Use: Never used  Substance and Sexual Activity   Alcohol use: Not Currently    Alcohol/week: 0.0 standard drinks   Drug use: No   Sexual activity: Never    Comment: never sexually active  Other Topics Concern   Not on file  Social History Narrative   Unemployed   Lives alone --update 11/11/19   Seeking work   Only child   Dog 3   Lives in a one story house with father, has a cat-12/26/16-sjb      Caffeine: 2-4 cups/day   Social Determinants of Corporate investment banker Strain: Not on file  Food Insecurity: Not on file  Transportation Needs: Not on file  Physical Activity: Not on file  Stress: Not on file  Social Connections: Not on file  Intimate Partner Violence: Not on file    Past Medical History, Surgical history, Social history, and Family history were reviewed and updated as appropriate.   Please see review of systems for further details on the patient's review from today.   Objective:   Physical Exam:  There were no vitals taken for this visit.  Physical Exam Neurological:     Mental Status: She is alert and oriented to person, place, and time.     Cranial Nerves: No dysarthria.  Psychiatric:        Attention  and Perception: Attention and perception normal.        Mood and Affect: Mood is anxious and depressed.        Speech: Speech normal.        Behavior: Behavior is cooperative.  Thought Content: Thought content normal. Thought content is not paranoid or delusional. Thought content does not include homicidal or suicidal ideation. Thought content does not include homicidal or suicidal plan.        Cognition and Memory: Cognition and memory normal.        Judgment: Judgment normal.     Comments: Insight intact     Lab Review:     Component Value Date/Time   NA 138 03/31/2020 1154   K 4.0 03/31/2020 1154   CL 105 03/31/2020 1154   CO2 22 03/31/2020 1154   GLUCOSE 91 03/31/2020 1154   BUN 11 03/31/2020 1154   CREATININE 0.80 03/31/2020 1154   CREATININE 0.74 09/30/2019 1047   CREATININE 0.75 07/06/2016 1534   CALCIUM 8.8 (L) 03/31/2020 1154   PROT 7.5 03/31/2020 1154   ALBUMIN 4.1 03/31/2020 1154   AST 31 03/31/2020 1154   AST 27 09/30/2019 1047   ALT 22 03/31/2020 1154   ALT 14 09/30/2019 1047   ALKPHOS 68 03/31/2020 1154   BILITOT 0.5 03/31/2020 1154   BILITOT 0.4 09/30/2019 1047   GFRNONAA >60 03/31/2020 1154   GFRNONAA >60 09/30/2019 1047   GFRAA >60 09/30/2019 1047       Component Value Date/Time   WBC 9.6 03/31/2020 1154   RBC 5.03 03/31/2020 1154   HGB 13.5 03/31/2020 1154   HGB 14.5 01/25/2020 1319   HGB 9.3 (L) 08/26/2019 1045   HCT 43.5 03/31/2020 1154   HCT 33.4 (L) 08/26/2019 1045   PLT 188 03/31/2020 1154   PLT 194 01/25/2020 1319   PLT 234 08/26/2019 1045   MCV 86.5 03/31/2020 1154   MCV 72 (L) 08/26/2019 1045   MCH 26.8 03/31/2020 1154   MCHC 31.0 03/31/2020 1154   RDW 12.9 03/31/2020 1154   RDW 15.4 08/26/2019 1045   LYMPHSABS 2.1 01/25/2020 1319   MONOABS 0.5 01/25/2020 1319   EOSABS 0.3 01/25/2020 1319   BASOSABS 0.1 01/25/2020 1319    No results found for: POCLITH, LITHIUM   No results found for: PHENYTOIN, PHENOBARB, VALPROATE, CBMZ    .res Assessment: Plan:   Discussed potential benefits, risks, and side effects of Modafinil. Discussed that it is indicated for sleep apnea and is used off label for ADHD and depression.  Also discussed considering re-start of Wellbutrin XL for depression since this may also improve mood, energy, and motivation and may be more effective now in combination with Sertraline.  Pt reports that she would prefer to start trial of Modafinil at this time and then consider Wellbutrin XL in the future if depression has not improved.  Will start Modafinil 200 mg 1/2-1 tab po q am for sleep apnea and off-label indications of ADHD and depression.  Continue Sertraline 200 mg po qd for depression and anxiety.  Continue Lamictal 200 mg po qd for depression.  Continue Ativan prn anxiety.  Continue Trazodone 50 mg 1/2-1 tab po QHS prn insomnia.  Recommend continuing therapy.  Pt to follow-up in 1-2 months or sooner if clinically indicated.  Patient advised to contact office with any questions, adverse effects, or acute worsening in signs and symptoms.   [name] was seen today for depression and anxiety.  Diagnoses and all orders for this visit:  Attention deficit hyperactivity disorder (ADHD), predominantly inattentive type -     modafinil (PROVIGIL) 200 MG tablet; Take 1/2-1 tablet in the morning  Sleep apnea, unspecified type -     modafinil (PROVIGIL) 200 MG tablet; Take 1/2-1  tablet in the morning  Major depression, recurrent, chronic (HCC) -     lamoTRIgine (LAMICTAL) 200 MG tablet; Take 1 tablet (200 mg total) by mouth daily. -     sertraline (ZOLOFT) 100 MG tablet; Take 2 tablets (200 mg total) by mouth daily.  Social phobia -     sertraline (ZOLOFT) 100 MG tablet; Take 2 tablets (200 mg total) by mouth daily. -     LORazepam (ATIVAN) 0.5 MG tablet; Take 1 tablet (0.5 mg total) by mouth every 8 (eight) hours as needed for anxiety.  Generalized anxiety disorder     Please see After Visit  Summary for patient specific instructions.  Future Appointments  Date Time Provider Department Center  04/26/2020  1:15 PM [facility] None  04/26/2020  1:45 PM [place], Brand Males, NP [facility] None  04/27/2020 10:00 AM [facility] PET CT 1 [facility] [place]  06/07/2020  1:20 PM Tobb, Kardie, DO [facility] None  06/10/2020  1:30 PM Corie Chiquito, PMHNP [facility] None  08/17/2020  3:00 PM Butch Penny, NP [facility] None    No orders of the defined types were placed in this encounter.     -------------------------------

## 2020-04-25 ENCOUNTER — Telehealth: Payer: Self-pay

## 2020-04-25 ENCOUNTER — Other Ambulatory Visit (HOSPITAL_COMMUNITY): Payer: BC Managed Care – PPO

## 2020-04-25 DIAGNOSIS — F900 Attention-deficit hyperactivity disorder, predominantly inattentive type: Secondary | ICD-10-CM

## 2020-04-25 DIAGNOSIS — G4730 Sleep apnea, unspecified: Secondary | ICD-10-CM

## 2020-04-25 DIAGNOSIS — F4323 Adjustment disorder with mixed anxiety and depressed mood: Secondary | ICD-10-CM | POA: Diagnosis not present

## 2020-04-25 MED ORDER — ARMODAFINIL 150 MG PO TABS: 150.0000 mg | ORAL_TABLET | Freq: Every day | ORAL | 2 refills | Status: DC

## 2020-04-25 NOTE — Telephone Encounter (Signed)
Prior authorization submitted for MODAFINIL 200 MG, [employer] DENIED request and require she try GENERIC ARMODAFINIL, which is on the formulary.    Shanda Bumps notified of requirements.

## 2020-04-26 ENCOUNTER — Inpatient Hospital Stay: Payer: BC Managed Care – PPO | Attending: Family

## 2020-04-26 ENCOUNTER — Encounter: Payer: Self-pay | Admitting: Family

## 2020-04-26 ENCOUNTER — Telehealth: Payer: Self-pay

## 2020-04-26 ENCOUNTER — Inpatient Hospital Stay (HOSPITAL_BASED_OUTPATIENT_CLINIC_OR_DEPARTMENT_OTHER): Payer: BC Managed Care – PPO | Admitting: Family

## 2020-04-26 ENCOUNTER — Other Ambulatory Visit: Payer: Self-pay

## 2020-04-26 VITALS — BP 135/89 | HR 88 | Temp 98.2°F | Resp 18 | Ht 67.0 in | Wt 370.4 lb

## 2020-04-26 DIAGNOSIS — D500 Iron deficiency anemia secondary to blood loss (chronic): Secondary | ICD-10-CM | POA: Diagnosis not present

## 2020-04-26 DIAGNOSIS — N920 Excessive and frequent menstruation with regular cycle: Secondary | ICD-10-CM | POA: Diagnosis not present

## 2020-04-26 LAB — CBC WITH DIFFERENTIAL (CANCER CENTER ONLY)
Abs Immature Granulocytes: 0.06 10*3/uL (ref 0.00–0.07)
Basophils Absolute: 0 10*3/uL (ref 0.0–0.1)
Basophils Relative: 0 %
Eosinophils Absolute: 0.2 10*3/uL (ref 0.0–0.5)
Eosinophils Relative: 2 %
HCT: 41.6 % (ref 36.0–46.0)
Hemoglobin: 13.4 g/dL (ref 12.0–15.0)
Immature Granulocytes: 1 %
Lymphocytes Relative: 19 %
Lymphs Abs: 1.7 10*3/uL (ref 0.7–4.0)
MCH: 26.5 pg (ref 26.0–34.0)
MCHC: 32.2 g/dL (ref 30.0–36.0)
MCV: 82.4 fL (ref 80.0–100.0)
Monocytes Absolute: 0.5 10*3/uL (ref 0.1–1.0)
Monocytes Relative: 5 %
Neutro Abs: 6.7 10*3/uL (ref 1.7–7.7)
Neutrophils Relative %: 73 %
Platelet Count: 219 10*3/uL (ref 150–400)
RBC: 5.05 MIL/uL (ref 3.87–5.11)
RDW: 14.1 % (ref 11.5–15.5)
WBC Count: 9.2 10*3/uL (ref 4.0–10.5)
nRBC: 0 % (ref 0.0–0.2)

## 2020-04-26 LAB — RETICULOCYTES
Immature Retic Fract: 19.9 % — ABNORMAL HIGH (ref 2.3–15.9)
RBC.: 5.04 MIL/uL (ref 3.87–5.11)
Retic Count, Absolute: 161.3 10*3/uL (ref 19.0–186.0)
Retic Ct Pct: 3.2 % — ABNORMAL HIGH (ref 0.4–3.1)

## 2020-04-26 NOTE — Progress Notes (Unsigned)
Hematology and Oncology Follow Up Visit  Margaret Golden [id number] [birth year] 29 y.o. 04/26/2020   Principle Diagnosis:  Iron deficiency anemia secondary to heavy irregular cycles  Current Therapy: IV iron as indicated   Interim History:  Margaret Golden is here today for follow-up. She had a successful D&C with IUD placement on 03/22/2020. She states that her cycle this month was much lighter. No other blood loss noted. No bruising or petechiae.  She still notes fatigue and is hoping to get another sleep study done to properly diagnose and treat her sleep apnea.  She had a bronchoscopy with biopsies from station 7 node. This was negative. She states that she will be having a PET scan tomorrow for further assessment.   No fever, chills, n/v, cough, rash, chest pain, palpitations, abdominal pain or changes in bowel or bladder habits.  She has occasional episodes of dizziness and SOB with over exertion.  The chronic swelling in her lower extremities is stable/unchanged.  No tenderness, numbness or tingling in her extremities at this time.  No falls or syncope.  She has maintained a good appetite and is staying well hydrated. Her weight is stable at 370 lbs.   ECOG Performance Status: 1 - Symptomatic but completely ambulatory  Medications:  Allergies as of 04/26/2020      Reactions   Augmentin [amoxicillin-pot Clavulanate] Hives      Medication List       Accurate as of April 26, 2020  2:07 PM. If you have any questions, ask your nurse or doctor.        albuterol 108 (90 Base) MCG/ACT inhaler Commonly known as: VENTOLIN HFA Inhale 2 puffs into the lungs every 6 (six) hours as needed for wheezing or shortness of breath (Chest tightness).   Armodafinil 150 MG tablet Take 1 tablet (150 mg total) by mouth daily.   aspirin-acetaminophen-caffeine 250-250-65 MG tablet Commonly known as: EXCEDRIN MIGRAINE Take 2 tablets by mouth daily as needed for headache or migraine.    furosemide 20 MG tablet Commonly known as: LASIX Take 1 tablet (20 mg total) by mouth 2 (two) times a week.   Fusion Plus Caps One capsule twice daily with orange juice. Stop your Ferrous sulfate. What changed:   how much to take  how to take this  when to take this  additional instructions   ibuprofen 800 MG tablet Commonly known as: ADVIL Take 1 tablet (800 mg total) by mouth every 8 (eight) hours as needed.   lamoTRIgine 200 MG tablet Commonly known as: LAMICTAL Take 1 tablet (200 mg total) by mouth daily.   levothyroxine 100 MCG tablet Commonly known as: SYNTHROID Take 100 mcg by mouth daily before breakfast. Take with the 112 mcg dose to equal 212 mcg daily   levothyroxine 112 MCG tablet Commonly known as: SYNTHROID Take 112 mcg by mouth daily before breakfast. Take with the 100 mcg dose to equal 212 mcg daily   LORazepam 0.5 MG tablet Commonly known as: Ativan Take 1 tablet (0.5 mg total) by mouth every 8 (eight) hours as needed for anxiety.   multivitamin with minerals tablet Take 1 tablet by mouth daily.   omeprazole 20 MG capsule Commonly known as: PRILOSEC Take 2 capsules (40 mg total) by mouth daily.   potassium chloride SA 20 MEQ tablet Commonly known as: KLOR-CON Take 1 tablet (20 mEq total) by mouth 2 (two) times a week.   propranolol 20 MG tablet Commonly known as: INDERAL Take 1 tablet (20  mg total) by mouth 3 (three) times daily.   sertraline 100 MG tablet Commonly known as: ZOLOFT Take 2 tablets (200 mg total) by mouth daily.   topiramate 50 MG tablet Commonly known as: Topamax Start with 50mg (1 tab) at bedtime. In 1 week increase to 100mg (2 tabs) at bedtime. What changed:   how much to take  how to take this  when to take this  additional instructions   traZODone 50 MG tablet Commonly known as: DESYREL Take 1/2-2 po QHS prn insomnia What changed:   how much to take  how to take this  when to take this  reasons to take  this  additional instructions   Vitamin D (Ergocalciferol) 1.25 MG (50000 UNIT) Caps capsule Commonly known as: DRISDOL Take 1 capsule (50,000 Units total) by mouth every 7 (seven) days. What changed: when to take this       Allergies:  Allergies  Allergen Reactions  . Augmentin [Amoxicillin-Pot Clavulanate] Hives    Past Medical History, Surgical history, Social history, and Family History were reviewed and updated.  Review of Systems: All other 10 point review of systems is negative.   Physical Exam:  height is 5\' 7"  (1.702 m) and weight is 370 lb 6.4 oz (168 kg) (abnormal). Her oral temperature is 98.2 F (36.8 C). Her blood pressure is 135/89 and her pulse is 88. Her respiration is 18 and oxygen saturation is 99%.   Wt Readings from Last 3 Encounters:  04/26/20 (!) 370 lb 6.4 oz (168 kg)  03/31/20 (!) 365 lb (165.6 kg)  03/23/20 (!) 372 lb 3.2 oz (168.8 kg)    Ocular: Sclerae unicteric, pupils equal, round and reactive to light Ear-nose-throat: Oropharynx clear, dentition fair Lymphatic: No cervical or supraclavicular adenopathy Lungs no rales or rhonchi, good excursion bilaterally Heart regular rate and rhythm, no murmur appreciated Abd soft, nontender, positive bowel sounds MSK no focal spinal tenderness, no joint edema Neuro: non-focal, well-oriented, appropriate affect Breasts: Deferred   Lab Results  Component Value Date   WBC 9.2 04/26/2020   HGB 13.4 04/26/2020   HCT 41.6 04/26/2020   MCV 82.4 04/26/2020   PLT 219 04/26/2020   Lab Results  Component Value Date   FERRITIN 80 01/25/2020   IRON 68 01/25/2020   TIBC 366 01/25/2020   UIBC 299 01/25/2020   IRONPCTSAT 18 (L) 01/25/2020   Lab Results  Component Value Date   RETICCTPCT 3.2 (H) 04/26/2020   RBC 5.05 04/26/2020   RBC 5.04 04/26/2020   No results found for: KPAFRELGTCHN, LAMBDASER, KAPLAMBRATIO No results found for: IGGSERUM, IGA, IGMSERUM No results found for: 04/28/2020, SPEI   Chemistry      Component Value Date/Time   NA 138 03/31/2020 1154   K 4.0 03/31/2020 1154   CL 105 03/31/2020 1154   CO2 22 03/31/2020 1154   BUN 11 03/31/2020 1154   CREATININE 0.80 03/31/2020 1154   CREATININE 0.74 09/30/2019 1047   CREATININE 0.75 07/06/2016 1534      Component Value Date/Time   CALCIUM 8.8 (L) 03/31/2020 1154   ALKPHOS 68 03/31/2020 1154   AST 31 03/31/2020 1154   AST 27 09/30/2019 1047   ALT 22 03/31/2020 1154   ALT 14 09/30/2019 1047   BILITOT 0.5 03/31/2020 1154   BILITOT 0.4 09/30/2019 1047       Impression and Plan: Margaret Golden is a very pleasant29 yo caucasian female with long history of iron deficiency  anemia secondary to heavy irregular cycles. Iron studies are pending. We will replace if needed.  Follow-up in 3 months.  She can contact our office with any questions or concerns.   Emeline Gins, NP 1/25/20222:07 PM

## 2020-04-26 NOTE — Telephone Encounter (Signed)
4 month appt per pt- verbal; order- no los yet, pt states will view on my chart    Margaret Golden

## 2020-04-27 ENCOUNTER — Telehealth: Payer: Self-pay | Admitting: Family

## 2020-04-27 ENCOUNTER — Ambulatory Visit (HOSPITAL_COMMUNITY)
Admission: RE | Admit: 2020-04-27 | Discharge: 2020-04-27 | Disposition: A | Discharge status: Home / Self Care | Payer: BC Managed Care – PPO | Source: Ambulatory Visit | Attending: Pulmonary Disease | Admitting: Pulmonary Disease

## 2020-04-27 DIAGNOSIS — R590 Localized enlarged lymph nodes: Secondary | ICD-10-CM

## 2020-04-27 DIAGNOSIS — R911 Solitary pulmonary nodule: Secondary | ICD-10-CM | POA: Diagnosis not present

## 2020-04-27 LAB — GLUCOSE, CAPILLARY: Glucose-Capillary: 92 mg/dL (ref 70–99)

## 2020-04-27 LAB — IRON AND TIBC
Iron: 54 ug/dL (ref 41–142)
Saturation Ratios: 15 % — ABNORMAL LOW (ref 21–57)
TIBC: 370 ug/dL (ref 236–444)
UIBC: 316 ug/dL (ref 120–384)

## 2020-04-27 LAB — FERRITIN: Ferritin: 66 ng/mL (ref 11–307)

## 2020-04-27 MED ORDER — FLUDEOXYGLUCOSE F - 18 (FDG) INJECTION
16.0300 | Freq: Once | INTRAVENOUS | Status: AC | PRN
  Administered 2020-04-27: 16.03 via INTRAVENOUS

## 2020-04-27 NOTE — Telephone Encounter (Signed)
Called and spoke with patient regarding appointments added.  She was ok with all dates & times per 1/26 sch msg

## 2020-05-02 DIAGNOSIS — F4323 Adjustment disorder with mixed anxiety and depressed mood: Secondary | ICD-10-CM | POA: Diagnosis not present

## 2020-05-03 ENCOUNTER — Inpatient Hospital Stay: Payer: BC Managed Care – PPO | Attending: Family

## 2020-05-03 ENCOUNTER — Other Ambulatory Visit: Payer: Self-pay

## 2020-05-03 VITALS — BP 108/64 | HR 79 | Temp 98.5°F | Resp 18

## 2020-05-03 DIAGNOSIS — N920 Excessive and frequent menstruation with regular cycle: Secondary | ICD-10-CM | POA: Diagnosis not present

## 2020-05-03 DIAGNOSIS — D500 Iron deficiency anemia secondary to blood loss (chronic): Secondary | ICD-10-CM | POA: Insufficient documentation

## 2020-05-03 MED ORDER — SODIUM CHLORIDE 0.9 % IV SOLN
200.0000 mg | Freq: Once | INTRAVENOUS | Status: AC
  Administered 2020-05-03: 200 mg via INTRAVENOUS
  Filled 2020-05-03: qty 200

## 2020-05-03 MED ORDER — SODIUM CHLORIDE 0.9 % IV SOLN
Freq: Once | INTRAVENOUS | Status: AC
  Filled 2020-05-03: qty 250

## 2020-05-03 NOTE — Progress Notes (Signed)
Pt refused to stay for 30 minute observation. VS taken/stable. Pt stable and discharged

## 2020-05-03 NOTE — Patient Instructions (Signed)

## 2020-05-06 ENCOUNTER — Inpatient Hospital Stay: Payer: BC Managed Care – PPO

## 2020-05-06 ENCOUNTER — Other Ambulatory Visit: Payer: Self-pay

## 2020-05-06 VITALS — BP 133/92 | HR 87 | Temp 99.0°F | Resp 17

## 2020-05-06 DIAGNOSIS — N920 Excessive and frequent menstruation with regular cycle: Secondary | ICD-10-CM

## 2020-05-06 DIAGNOSIS — D500 Iron deficiency anemia secondary to blood loss (chronic): Secondary | ICD-10-CM | POA: Diagnosis not present

## 2020-05-06 MED ORDER — IRON SUCROSE 20 MG/ML IV SOLN
200.0000 mg | Freq: Once | INTRAVENOUS | Status: AC
  Administered 2020-05-06: 200 mg via INTRAVENOUS
  Filled 2020-05-06: qty 10

## 2020-05-06 MED ORDER — SODIUM CHLORIDE 0.9 % IV SOLN
Freq: Once | INTRAVENOUS | Status: AC
  Filled 2020-05-06: qty 250

## 2020-05-06 NOTE — Progress Notes (Signed)
Pt declined to stay for post infusion observation period. Pt stated she has tolerated medication multiple times prior without difficulty. Pt aware to call clinic with any questions or concerns. Pt verbalized understanding and had no further questions.  ? ?

## 2020-05-06 NOTE — Patient Instructions (Signed)

## 2020-05-09 DIAGNOSIS — F4323 Adjustment disorder with mixed anxiety and depressed mood: Secondary | ICD-10-CM | POA: Diagnosis not present

## 2020-05-10 ENCOUNTER — Other Ambulatory Visit: Payer: Self-pay

## 2020-05-10 ENCOUNTER — Inpatient Hospital Stay: Payer: BC Managed Care – PPO

## 2020-05-10 VITALS — BP 111/76 | HR 83 | Resp 18

## 2020-05-10 DIAGNOSIS — N920 Excessive and frequent menstruation with regular cycle: Secondary | ICD-10-CM | POA: Diagnosis not present

## 2020-05-10 DIAGNOSIS — D500 Iron deficiency anemia secondary to blood loss (chronic): Secondary | ICD-10-CM | POA: Diagnosis not present

## 2020-05-10 MED ORDER — SODIUM CHLORIDE 0.9 % IV SOLN
Freq: Once | INTRAVENOUS | Status: AC
  Filled 2020-05-10: qty 250

## 2020-05-10 MED ORDER — SODIUM CHLORIDE 0.9 % IV SOLN
200.0000 mg | Freq: Once | INTRAVENOUS | Status: AC
  Administered 2020-05-10: 200 mg via INTRAVENOUS
  Filled 2020-05-10: qty 200

## 2020-05-10 NOTE — Progress Notes (Signed)
Pt c/o pain medial to the PIV site. No edema or reddness at site. PIV removed per pt request. . Approx. 800 of 110cc bag of IV Iron received. Pt advised she did not wish to reschedule for additional iron infusions. Pt will f/u as scheduled in May. No further concerns. VSS pt discharged

## 2020-05-10 NOTE — Patient Instructions (Signed)

## 2020-05-13 ENCOUNTER — Other Ambulatory Visit: Payer: Self-pay

## 2020-05-13 DIAGNOSIS — R590 Localized enlarged lymph nodes: Secondary | ICD-10-CM

## 2020-05-14 LAB — ACID FAST CULTURE WITH REFLEXED SENSITIVITIES (MYCOBACTERIA): Acid Fast Culture: NEGATIVE

## 2020-05-16 DIAGNOSIS — F4323 Adjustment disorder with mixed anxiety and depressed mood: Secondary | ICD-10-CM | POA: Diagnosis not present

## 2020-05-16 NOTE — Telephone Encounter (Signed)
Please advise if anyone has spoke with this patient about her recent PET scan and if she is aware of the referral to CardioThoracic Surgery for lymphadenopathy.    She has reached out via Mychart asking for an update  I'm reaching out because I have not heard from you or your team about the last scan I had done and the results. My quality of life continues to diminish with the symptoms I have with my chest and breathing, so I would like to know what the next steps are. I cannot continue to live this way, so I need to know what I have and how to treat it. I would appreciate if you could reach out at your earliest convenience. Thank you.

## 2020-05-20 ENCOUNTER — Ambulatory Visit: Payer: Self-pay | Admitting: Certified Nurse Midwife

## 2020-05-23 ENCOUNTER — Other Ambulatory Visit: Payer: Self-pay

## 2020-05-23 ENCOUNTER — Ambulatory Visit: Payer: BC Managed Care – PPO | Admitting: Pulmonary Disease

## 2020-05-23 DIAGNOSIS — F4323 Adjustment disorder with mixed anxiety and depressed mood: Secondary | ICD-10-CM | POA: Diagnosis not present

## 2020-05-27 ENCOUNTER — Other Ambulatory Visit: Payer: Self-pay | Admitting: Thoracic Surgery (Cardiothoracic Vascular Surgery)

## 2020-05-27 ENCOUNTER — Other Ambulatory Visit: Payer: Self-pay

## 2020-05-27 ENCOUNTER — Institutional Professional Consult (permissible substitution): Payer: BC Managed Care – PPO | Admitting: Thoracic Surgery (Cardiothoracic Vascular Surgery)

## 2020-05-27 ENCOUNTER — Encounter (HOSPITAL_COMMUNITY): Payer: Self-pay | Admitting: Radiology

## 2020-05-27 ENCOUNTER — Encounter: Payer: Self-pay | Admitting: Thoracic Surgery (Cardiothoracic Vascular Surgery)

## 2020-05-27 VITALS — BP 128/87 | HR 88 | Resp 20 | Ht 67.0 in | Wt 370.0 lb

## 2020-05-27 DIAGNOSIS — R590 Localized enlarged lymph nodes: Secondary | ICD-10-CM

## 2020-05-27 NOTE — Progress Notes (Signed)
Margaret Golden Female, 29 y.o., 07/20/1990  MRN:  [id number] Phone:  [phone] Judie Petit)       PCP:  [name], NP Coverage:  Valinda Hoar [employer] Ppo  Next Appt With Radiology ([facility]) 06/02/2020 at 8:00 AM           RE: CT Biopsy Received: Today Oley Balm, MD  Henry Russel D  Ok   [place] Core or FNA R supraclav LAN  See PET Im 52 Se 4   Spoke with Dr L on phone told him it is small and deep. We would look and try if safe. Other alternative = mediastinoscopy. Expectations are not high, but worth a look/see.   Thx  DDH        Previous Messages   ----- Message -----  From: Henry Russel D  Sent: 05/27/2020 10:30 AM EST  To: Ir Procedure Requests  Subject: CT Biopsy                     Procedure:  CT Biopsy   Reason: Mediastinal lymphadenopathy   History: CT, NM PET in computer   Provider: Corliss Skains   Provider Contact: [phone]

## 2020-05-27 NOTE — Progress Notes (Signed)
Margaret Golden Female, 29 y.o., 19-Feb-1991  MRN:  [id number] Phone:  [phone] Judie Petit)       PCP:  [name], NP Coverage:  [employer] Hoar [employer] Ppo  Next Appt With Cardiology 06/07/2020 at 1:20 PM           RE: CT Biopsy Received: Today Richarda Overlie, MD  Henry Russel D  Discussed with Dr. Cliffton Asters. Schedule for US guided biopsy of right supraclavicular lymph node.   [place]        Previous Messages   ----- Message -----  From: Henry Russel D  Sent: 05/27/2020 10:30 AM EST  To: Ir Procedure Requests  Subject: CT Biopsy                     Procedure:  CT Biopsy   Reason: Mediastinal lymphadenopathy   History: CT, NM PET in computer   Provider: Corliss Skains   Provider Contact: [phone]

## 2020-05-27 NOTE — Progress Notes (Signed)
[address]       [place] [address]             [phone]                    Margaret Golden [place] Medical Record #[id number] Date of Birth: October 14, 1990  Referring: Martina Sinner, MD Primary Care: [name], NP Primary Cardiologist: Thomasene Ripple, DO  Chief Complaint:    Chief Complaint  Patient presents with  . Consult    Surgical consult, PET Scan 04/27/20, Bronch 03/31/20, Chest CT 03/14/20    History of Present Illness:    Margaret Golden 30 y.o. female referred by Dr. Tonia Brooms for surgical evaluation of mediastinal adenopathy.  She first developed some exertional dyspnea and 2020 was noted to be anemic initially.  This was thought to be due to heavy menstruation cycles.  She was scheduled to undergo D&C but during her preoperative evaluation she was referred to cardiology for further work-up of her chest pain and shortness of breath.  Cross-sectional imaging that was obtained showed mediastinal and hilar adenopathy.  She subsequently underwent a endobronchial biopsy of the lymph nodes which was negative for any malignancy.  On recent PET/CT she was noted to have avidity in a supraclavicular lymph node, mediastinal and hilar lymph nodes, small pulmonary nodules, and portacaval lymph nodes.  In regards to her symptoms she remains short of breath along with some chest tightness.  She states that she has a sharp pain even with prolonged conversations which she currently has during the interview today.  Her weight has been stable.  She denies any night sweats.  She is very anxious about obtaining a diagnosis and ultimately being treated for this.    Past Medical History:  Diagnosis Date  . Acid reflux   . ADD (attention deficit disorder)   . Anemia   . Anxiety   . Arthritis    left ankle  . Back pain   . Chest pain   . Depression   . Dyspnea    with exertion  . Exercise-induced asthma   . Fatty liver   . History of blood transfusion    . History of chicken pox   . History of kidney stones    passed   . Hypothyroidism   . Joint pain   . Lower extremity edema   . Major depressive disorder   . Migraines    chronic  . Palpitations   . Pneumonia   . RLS (restless legs syndrome)   . Sleep apnea 2021  . Thyroid disease    hypothyroid    Past Surgical History:  Procedure Laterality Date  . BRONCHIAL NEEDLE ASPIRATION BIOPSY  03/31/2020   Procedure: BRONCHIAL NEEDLE ASPIRATION BIOPSIES;  Surgeon: Josephine Igo, DO;  Location: [facility];  Service: Pulmonary;;  . BRONCHIAL WASHINGS  03/31/2020   Procedure: BRONCHIAL WASHINGS;  Surgeon: Josephine Igo, DO;  Location: [facility];  Service: Pulmonary;;  . DILATATION & CURETTAGE/HYSTEROSCOPY WITH MYOSURE N/A 03/22/2020   Procedure: DILATATION & CURETTAGE/HYSTEROSCOPY WITH MYOSURE;  Surgeon: Romualdo Bolk, MD;  Location[facility];  Service: Gynecology;  Laterality: N/A;  . INTRAUTERINE DEVICE (IUD) INSERTION N/A 03/22/2020   Procedure: INTRAUTERINE DEVICE (IUD) INSERTION;  Surgeon: Romualdo Bolk, MD;  Location: [facility];  Service: Gynecology;  Laterality: N/A;  Mirena IUD insertion  . KNEE ARTHROSCOPY Right 2007  . TONSILLECTOMY  2010  . TONSILLECTOMY    .  VIDEO BRONCHOSCOPY WITH ENDOBRONCHIAL ULTRASOUND N/A 03/31/2020   Procedure: VIDEO BRONCHOSCOPY WITH ENDOBRONCHIAL ULTRASOUND;  Surgeon: Josephine IgoIcard, Bradley L, DO;  Location: [facility];  Service: Pulmonary;  Laterality: N/A;  . WISDOM TOOTH EXTRACTION    . WISDOM TOOTH EXTRACTION Bilateral    upper and lower    Family History  Problem Relation Age of Onset  . Depression Mother   . Obesity Mother   . Depression Father   . Sleep apnea Father   . Obesity Father   . Cancer Paternal Aunt        colon  . Cancer Paternal Uncle        brain cancer  . Diabetes Maternal Grandfather   . Arthritis Maternal Grandfather   . Arthritis Maternal Grandmother   . Breast cancer Maternal Grandmother   . Arthritis  Paternal Grandmother   . Arthritis Paternal Grandfather   . Migraines Neg Hx      Social History   Tobacco Use  Smoking Status Never Smoker  Smokeless Tobacco Never Used    Social History   Substance and Sexual Activity  Alcohol Use Not Currently  . Alcohol/week: 0.0 standard drinks     Allergies  Allergen Reactions  . Augmentin [Amoxicillin-Pot Clavulanate] Hives    Current Outpatient Medications  Medication Sig Dispense Refill  . albuterol (VENTOLIN HFA) 108 (90 Base) MCG/ACT inhaler Inhale 2 puffs into the lungs every 6 (six) hours as needed for wheezing or shortness of breath (Chest tightness). 18 g 0  . Armodafinil 150 MG tablet Take 1 tablet (150 mg total) by mouth daily. 30 tablet 2  . aspirin-acetaminophen-caffeine (EXCEDRIN MIGRAINE) 250-250-65 MG tablet Take 2 tablets by mouth daily as needed for headache or migraine.    Marland Kitchen. ibuprofen (ADVIL) 800 MG tablet Take 1 tablet (800 mg total) by mouth every 8 (eight) hours as needed. 30 tablet 0  . Iron-FA-B Cmp-C-Biot-Probiotic (FUSION PLUS) CAPS One capsule twice daily with orange juice. Stop your Ferrous sulfate. (Patient taking differently: Take 1 capsule by mouth 2 (two) times daily. with orange juice. Stop your Ferrous sulfate.) 30 capsule 1  . lamoTRIgine (LAMICTAL) 200 MG tablet Take 1 tablet (200 mg total) by mouth daily. 90 tablet 0  . levothyroxine (SYNTHROID) 100 MCG tablet Take 100 mcg by mouth daily before breakfast. Take with the 112 mcg dose to equal 212 mcg daily    . levothyroxine (SYNTHROID) 112 MCG tablet Take 112 mcg by mouth daily before breakfast. Take with the 100 mcg dose to equal 212 mcg daily    . LORazepam (ATIVAN) 0.5 MG tablet Take 1 tablet (0.5 mg total) by mouth every 8 (eight) hours as needed for anxiety. 30 tablet 1  . Multiple Vitamins-Minerals (MULTIVITAMIN WITH MINERALS) tablet Take 1 tablet by mouth daily.    Marland Kitchen. omeprazole (PRILOSEC) 20 MG capsule Take 2 capsules (40 mg total) by mouth daily.  180 capsule 2  . potassium chloride SA (KLOR-CON) 20 MEQ tablet Take 1 tablet (20 mEq total) by mouth 2 (two) times a week. 26 tablet 0  . propranolol (INDERAL) 20 MG tablet Take 1 tablet (20 mg total) by mouth 3 (three) times daily. 270 tablet 3  . sertraline (ZOLOFT) 100 MG tablet Take 2 tablets (200 mg total) by mouth daily. 180 tablet 1  . topiramate (TOPAMAX) 50 MG tablet Start with 50mg (1 tab) at bedtime. In 1 week increase to 100mg (2 tabs) at bedtime. (Patient taking differently: Take 100 mg by mouth at bedtime.) 180 tablet 6  .  traZODone (DESYREL) 50 MG tablet Take 1/2-2 po QHS prn insomnia (Patient taking differently: Take 25-100 mg by mouth at bedtime as needed for sleep.) 60 tablet 1  . Vitamin D, Ergocalciferol, (DRISDOL) 1.25 MG (50000 UNIT) CAPS capsule Take 1 capsule (50,000 Units total) by mouth every 7 (seven) days. (Patient taking differently: Take 50,000 Units by mouth every Monday.) 4 capsule 0  . furosemide (LASIX) 20 MG tablet Take 1 tablet (20 mg total) by mouth 2 (two) times a week. 26 tablet 0   No current facility-administered medications for this visit.    Review of Systems  Constitutional: Positive for malaise/fatigue. Negative for weight loss.  Respiratory: Positive for shortness of breath. Negative for cough.   Cardiovascular: Positive for chest pain. Negative for palpitations.  Neurological: Negative.   Endo/Heme/Allergies: Negative.   Psychiatric/Behavioral: The patient is nervous/anxious.     PHYSICAL EXAMINATION: BP 128/87   Pulse 88   Resp 20   Ht 5\' 7"  (1.702 m)   Wt (!) 370 lb (167.8 kg)   SpO2 97% Comment: RA  BMI 57.95 kg/m   Physical Exam Constitutional:      General: She is not in acute distress.    Appearance: She is obese. She is not ill-appearing or toxic-appearing.  Eyes:     Extraocular Movements: Extraocular movements intact.  Cardiovascular:     Rate and Rhythm: Normal rate.  Pulmonary:     Effort: Pulmonary effort is normal. No  respiratory distress.  Abdominal:     General: There is no distension.  Musculoskeletal:        General: Normal range of motion.  Skin:    General: Skin is warm and dry.  Neurological:     General: No focal deficit present.     Mental Status: She is alert and oriented to person, place, and time.      Diagnostic Studies & Laboratory data:     Recent Radiology Findings:   NM PET Image Initial (PI) Skull Base To Thigh  Result Date: 04/27/2020 CLINICAL DATA:  Initial treatment strategy for progressive thoracic adenopathy. No given history of malignancy. Patient underwent bronchoscopy with mediastinal nodal biopsy 03/31/2020, demonstrating no evidence of malignancy. EXAM: NUCLEAR MEDICINE PET SKULL BASE TO THIGH TECHNIQUE: 16.03 mCi F-18 FDG was injected intravenously. Full-ring PET imaging was performed from the skull base to thigh after the radiotracer. CT data was obtained and used for attenuation correction and anatomic localization. Fasting blood glucose: 92 mg/dl COMPARISON:  Chest CT 04/02/2020, 12/17/2019 and 06/29/2019. FINDINGS: Mediastinal blood pool activity: SUV max 1.2 Liver activity: SUV max 5.4 NECK: Single hypermetabolic right supraclavicular lymph node is not pathologically enlarged, demonstrating an SUV max of 6.2. No other hypermetabolic cervical lymph nodes are identified.There are no lesions of the pharyngeal mucosal space. Activity within the lymphoid tissue of Waldeyer's ring is within physiologic limits. Incidental CT findings: none CHEST: As demonstrated on recent CT, there are multiple mildly enlarged mediastinal and hilar lymph nodes bilaterally which are moderately hypermetabolic. For example, there is a right superior hilar node with an SUV max of 13.3, a subcarinal node with an SUV max of 10.1 and a left infrahilar node with an SUV max of 13.2. There are no hypermetabolic axillary lymph nodes. 7 mm right upper lobe nodule on image 14/8 is hypermetabolic with an SUV max of  7.6. There is also focal hypermetabolic activity posterior to the right hilum (SUV max 5.9), without definite corresponding nodule on the CT images. Multiple other smaller  pulmonary nodules are unchanged in size, without definite hypermetabolic activity, although too small to optimally evaluate by PET. Incidental CT findings: Small hiatal hernia. ABDOMEN/PELVIS: There is no hypermetabolic activity within the liver, adrenal glands, spleen or pancreas. There is focal hypermetabolic activity within the porta hepatis (SUV max 11.8), likely corresponding with mildly enlarged portacaval lymph nodes. There are no hypermetabolic lymph nodes more inferiorly within the abdomen or pelvis. No bowel lesions identified. Incidental CT findings: Nonobstructing bilateral renal calculi. 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 An intrauterine device is noted. SKELETON: There is mildly increased and heterogeneous metabolic activity throughout the bones, especially in the spine. For example, in the lower thoracic region, there is an SUV max of 7.4. No focal lesions are identified. Incidental CT findings: none IMPRESSION: 1. Multifocal hypermetabolic nodal activity throughout the mediastinum, hilar regions and porta hepatis. These lymph nodes have developed since the baseline CT of 06/29/2019. 2. Multiple nonspecific small pulmonary nodules are again noted bilaterally, stable from recent studies, but also new from baseline exam. The largest nodule in the right upper lobe is hypermetabolic. 3. Nonspecific heterogeneous and mildly increased bone marrow activity without focally suspicious lesions. No suspicious solid organ involvement. 4. These findings remain nonspecific. Given the patient's young age and previous negative biopsy, an inflammatory process such as sarcoidosis is favored. However, a lymphoproliferative process is not completely excluded, and continued CT follow-up and consideration of repeat tissue sampling  recommended. Electronically Signed   By: Carey Bullocks M.D.   On: 04/27/2020 14:08       I have independently reviewed the above radiology studies  and reviewed the findings with the patient.   Recent Lab Findings: Lab Results  Component Value Date   WBC 9.2 04/26/2020   HGB 13.4 04/26/2020   HCT 41.6 04/26/2020   PLT 219 04/26/2020   GLUCOSE 91 03/31/2020   CHOL 150 09/03/2019   TRIG 120 09/03/2019   HDL 39 (L) 09/03/2019   LDLCALC 89 09/03/2019   ALT 22 03/31/2020   AST 31 03/31/2020   NA 138 03/31/2020   K 4.0 03/31/2020   CL 105 03/31/2020   CREATININE 0.80 03/31/2020   BUN 11 03/31/2020   CO2 22 03/31/2020   TSH 0.66 11/20/2019   HGBA1C 4.9 09/03/2019       Problem List: Right supraclavicular adenopathy with an SUV of 6.2 Bilateral hilar lymphadenopathy with increased avidity Subcarinal lymphadenopathy with avidity of 13.2 5 mm right upper lobe pulmonary nodule with SUV of 7.6 Portal hepatis avidity with SUV max of 11.8   Assessment / Plan:   30 year old female mediastinal and hilar lymphadenopathy along with supraclavicular lymphadenopathy as well as portal caval lymphadenopathy.  Based off of the pet imaging this is very concerning for lymphoma.  She has undergone a navigational bronchoscopy which was nondiagnostic.  She has been to discuss potential for surgical biopsy.  She is very morbidly obese with a BMI of 58, which would make mediastinoscopy somewhat challenging.  She does have a supraclavicular lymph node but again due to her body habitus this is not palpable thus have referred her to interventional radiology to assess the potential for an image guided core needle biopsy of the supraclavicular node.  If this is not possible then the other option would be for an mediastinoscopy vs R RATS.       Corliss Skains 05/27/2020 11:01 AM

## 2020-05-30 DIAGNOSIS — F4323 Adjustment disorder with mixed anxiety and depressed mood: Secondary | ICD-10-CM | POA: Diagnosis not present

## 2020-06-02 ENCOUNTER — Ambulatory Visit (HOSPITAL_COMMUNITY): Admission: RE | Admit: 2020-06-02 | Payer: BC Managed Care – PPO | Source: Ambulatory Visit

## 2020-06-06 DIAGNOSIS — F4323 Adjustment disorder with mixed anxiety and depressed mood: Secondary | ICD-10-CM | POA: Diagnosis not present

## 2020-06-07 ENCOUNTER — Encounter: Payer: Self-pay | Admitting: Cardiology

## 2020-06-07 ENCOUNTER — Ambulatory Visit (INDEPENDENT_AMBULATORY_CARE_PROVIDER_SITE_OTHER): Payer: BC Managed Care – PPO | Admitting: Cardiology

## 2020-06-07 ENCOUNTER — Other Ambulatory Visit: Payer: Self-pay

## 2020-06-07 VITALS — BP 120/82 | HR 84 | Ht 67.0 in | Wt 374.1 lb

## 2020-06-07 DIAGNOSIS — E662 Morbid (severe) obesity with alveolar hypoventilation: Secondary | ICD-10-CM | POA: Diagnosis not present

## 2020-06-07 DIAGNOSIS — R002 Palpitations: Secondary | ICD-10-CM

## 2020-06-07 DIAGNOSIS — R079 Chest pain, unspecified: Secondary | ICD-10-CM | POA: Diagnosis not present

## 2020-06-07 NOTE — Progress Notes (Signed)
Cardiology Office Note:    Date:  06/07/2020   ID:  Margaret Golden, DOB 1990/10/21, MRN [id number]  PCP:  [name], NP  Cardiologist:  Thomasene Ripple, DO  Electrophysiologist:  None   Referring MD: [name], NP   I am so short of breath.  History of Present Illness:    Margaret Golden is a 30 y.o. female with a hx of NSVT, anemia due to menorrhagia, GAD, MDD, dyspnea, hypothyroidism, and mediastinal lymphadenopathy.  I saw the patient on March 04, 2020 at that time we increased her propranolol to 20 mg daily.  She tells me this has been working for her.  She had had multiple pulmonary procedures including biopsy. . Past Medical History:  Diagnosis Date  . Acid reflux   . ADD (attention deficit disorder)   . Anemia   . Anxiety   . Arthritis    left ankle  . Back pain   . Chest pain   . Depression   . Dyspnea    with exertion  . Exercise-induced asthma   . Fatty liver   . History of blood transfusion   . History of chicken pox   . History of kidney stones    passed   . Hypothyroidism   . Joint pain   . Lower extremity edema   . Major depressive disorder   . Migraines    chronic  . Palpitations   . Pneumonia   . RLS (restless legs syndrome)   . Sleep apnea 2021  . Thyroid disease    hypothyroid    Past Surgical History:  Procedure Laterality Date  . BRONCHIAL NEEDLE ASPIRATION BIOPSY  03/31/2020   Procedure: BRONCHIAL NEEDLE ASPIRATION BIOPSIES;  Surgeon: Josephine Igo, DO;  Location: [facility];  Service: Pulmonary;;  . BRONCHIAL WASHINGS  03/31/2020   Procedure: BRONCHIAL WASHINGS;  Surgeon: Josephine Igo, DO;  Location: [facility];  Service: Pulmonary;;  . DILATATION & CURETTAGE/HYSTEROSCOPY WITH MYOSURE N/A 03/22/2020   Procedure: DILATATION & CURETTAGE/HYSTEROSCOPY WITH MYOSURE;  Surgeon: Romualdo Bolk, MD;  Location: [facility] OR;  Service: Gynecology;  Laterality: N/A;  . INTRAUTERINE DEVICE (IUD) INSERTION N/A  03/22/2020   Procedure: INTRAUTERINE DEVICE (IUD) INSERTION;  Surgeon: Romualdo Bolk, MD;  Location: [facility];  Service: Gynecology;  Laterality: N/A;  Mirena IUD insertion  . KNEE ARTHROSCOPY Right 2007  . TONSILLECTOMY  2010  . TONSILLECTOMY    . VIDEO BRONCHOSCOPY WITH ENDOBRONCHIAL ULTRASOUND N/A 03/31/2020   Procedure: VIDEO BRONCHOSCOPY WITH ENDOBRONCHIAL ULTRASOUND;  Surgeon: Josephine Igo, DO;  Location: [facility];  Service: Pulmonary;  Laterality: N/A;  . WISDOM TOOTH EXTRACTION    . WISDOM TOOTH EXTRACTION Bilateral    upper and lower    Current Medications: Current Meds  Medication Sig  . albuterol (VENTOLIN HFA) 108 (90 Base) MCG/ACT inhaler Inhale 2 puffs into the lungs every 6 (six) hours as needed for wheezing or shortness of breath (Chest tightness).  . Armodafinil 150 MG tablet Take 1 tablet (150 mg total) by mouth daily.  Marland Kitchen aspirin-acetaminophen-caffeine (EXCEDRIN MIGRAINE) 250-250-65 MG tablet Take 2 tablets by mouth daily as needed for headache or migraine.  . furosemide (LASIX) 20 MG tablet Take 1 tablet (20 mg total) by mouth 2 (two) times a week.  Marland Kitchen ibuprofen (ADVIL) 800 MG tablet Take 1 tablet (800 mg total) by mouth every 8 (eight) hours as needed.  . Iron-FA-B Cmp-C-Biot-Probiotic (FUSION PLUS) CAPS One capsule twice daily with orange juice. Stop  your Ferrous sulfate. (Patient taking differently: Take 1 capsule by mouth 2 (two) times daily. with orange juice. Stop your Ferrous sulfate.)  . lamoTRIgine (LAMICTAL) 200 MG tablet Take 1 tablet (200 mg total) by mouth daily.  Marland Kitchen levothyroxine (SYNTHROID) 100 MCG tablet Take 100 mcg by mouth daily before breakfast. Take with the 112 mcg dose to equal 212 mcg daily  . levothyroxine (SYNTHROID) 112 MCG tablet Take 112 mcg by mouth daily before breakfast. Take with the 100 mcg dose to equal 212 mcg daily  . LORazepam (ATIVAN) 0.5 MG tablet Take 1 tablet (0.5 mg total) by mouth every 8 (eight) hours as needed for  anxiety.  . Multiple Vitamins-Minerals (MULTIVITAMIN WITH MINERALS) tablet Take 1 tablet by mouth daily.  Marland Kitchen omeprazole (PRILOSEC) 20 MG capsule Take 2 capsules (40 mg total) by mouth daily.  . potassium chloride SA (KLOR-CON) 20 MEQ tablet Take 1 tablet (20 mEq total) by mouth 2 (two) times a week.  . propranolol (INDERAL) 20 MG tablet Take 1 tablet (20 mg total) by mouth 3 (three) times daily.  . sertraline (ZOLOFT) 100 MG tablet Take 2 tablets (200 mg total) by mouth daily.  Marland Kitchen topiramate (TOPAMAX) 50 MG tablet Start with 50mg (1 tab) at bedtime. In 1 week increase to 100mg (2 tabs) at bedtime. (Patient taking differently: Take 100 mg by mouth at bedtime.)  . traZODone (DESYREL) 50 MG tablet Take 1/2-2 po QHS prn insomnia (Patient taking differently: Take 25-100 mg by mouth at bedtime as needed for sleep.)  . Vitamin D, Ergocalciferol, (DRISDOL) 1.25 MG (50000 UNIT) CAPS capsule Take 1 capsule (50,000 Units total) by mouth every 7 (seven) days. (Patient taking differently: Take 50,000 Units by mouth every Monday.)     Allergies:   Augmentin [amoxicillin-pot clavulanate]   Social History   Socioeconomic History  . Marital status: Single    Spouse name: Not on file  . Number of children: 0  . Years of education: 22  . Highest education level: Not on file  Occupational History  . Occupation: Saturday  Tobacco Use  . Smoking status: Never Smoker  . Smokeless tobacco: Never Used  Vaping Use  . Vaping Use: Never used  Substance and Sexual Activity  . Alcohol use: Not Currently    Alcohol/week: 0.0 standard drinks  . Drug use: No  . Sexual activity: Never    Comment: never sexually active  Other Topics Concern  . Not on file  Social History Narrative   Unemployed   Lives alone --update 11/11/19   Seeking work   Only child   Dog 3   Lives in a one story house with father, has a cat-12/26/16-sjb      Caffeine: 2-4 cups/day   Social Determinants of 01/11/20 Strain: Not on file  Food Insecurity: Not on file  Transportation Needs: Not on file  Physical Activity: Not on file  Stress: Not on file  Social Connections: Not on file     Family History: The patient's family history includes Arthritis in her maternal grandfather, maternal grandmother, paternal grandfather, and paternal grandmother; Breast cancer in her maternal grandmother; Cancer in her paternal aunt and paternal uncle; Depression in her father and mother; Diabetes in her maternal grandfather; Obesity in her father and mother; Sleep apnea in her father. There is no history of Migraines.  ROS:   Review of Systems  Constitution: Negative for decreased appetite, fever and weight gain.  HENT: Negative for congestion, ear discharge,  hoarse voice and sore throat.   Eyes: Negative for discharge, redness, vision loss in right eye and visual halos.  Cardiovascular: Negative for chest pain, dyspnea on exertion, leg swelling, orthopnea and palpitations.  Respiratory: Negative for cough, hemoptysis, shortness of breath and snoring.   Endocrine: Negative for heat intolerance and polyphagia.  Hematologic/Lymphatic: Negative for bleeding problem. Does not bruise/bleed easily.  Skin: Negative for flushing, nail changes, rash and suspicious lesions.  Musculoskeletal: Negative for arthritis, joint pain, muscle cramps, myalgias, neck pain and stiffness.  Gastrointestinal: Negative for abdominal pain, bowel incontinence, diarrhea and excessive appetite.  Genitourinary: Negative for decreased libido, genital sores and incomplete emptying.  Neurological: Negative for brief paralysis, focal weakness, headaches and loss of balance.  Psychiatric/Behavioral: Negative for altered mental status, depression and suicidal ideas.  Allergic/Immunologic: Negative for HIV exposure and persistent infections.    EKGs/Labs/Other Studies Reviewed:    The following studies were reviewed today:   EKG:  None  today  Coronary CTA August 2021 Coronary calcium score: The patient's coronary artery calcium score is 0, which places the patient in the 0 percentile.  Coronary arteries: Normal coronary origins.  Right dominance.  Right Coronary Artery: Dominant. Large vessel which gives of the R-PDA and R-PLB branches. No disease.  Left Main Coronary Artery: Normal. Bifurcates into the LAD and LCx arteries.  Left Anterior Descending Coronary Artery: Large artery that reaches the apex without disease. 2 smaller diagonal branches without disease.  Left Circumflex Artery: AV groove vessel with 2 small OM branches - no disease.  Aorta: Normal size, 30 mm at the mid ascending aorta (level of the PA bifurcation) measured double oblique. No calcifications. No dissection.  Aortic Valve: Trileaflet.  No calcifications.  Other findings:  Normal pulmonary vein drainage into the left atrium.  Normal left atrial appendage without a thrombus.  Normal size of the pulmonary artery.  Note significant non-cardiac findings as per radiology over-read.  IMPRESSION: 1. No evidence of CAD, CADRADS = 0.  2. Coronary calcium score of 0. This was 0 percentile for age and sex matched control.  3. Normal coronary origin with right dominance.  4. Please note significant radiology findings of adenopathy that may explain chest pain.  Recent Labs: 11/20/2019: TSH 0.66 03/31/2020: ALT 22; BUN 11; Creatinine, Ser 0.80; Potassium 4.0; Sodium 138 04/26/2020: Hemoglobin 13.4; Platelet Count 219  Recent Lipid Panel    Component Value Date/Time   CHOL 150 09/03/2019 1503   TRIG 120 09/03/2019 1503   HDL 39 (L) 09/03/2019 1503   CHOLHDL 4 07/21/2019 1139   VLDL 36.6 07/21/2019 1139   LDLCALC 89 09/03/2019 1503    Physical Exam:    VS:  BP 120/82   Pulse 84   Ht 5\' 7"  (1.702 m)   Wt (!) 374 lb 1.6 oz (169.7 kg)   SpO2 98%   BMI 58.59 kg/m     Wt Readings from Last 3 Encounters:   06/07/20 (!) 374 lb 1.6 oz (169.7 kg)  05/27/20 (!) 370 lb (167.8 kg)  04/26/20 (!) 370 lb 6.4 oz (168 kg)     GEN: Well nourished, well developed in no acute distress HEENT: Normal NECK: No JVD; No carotid bruits LYMPHATICS: No lymphadenopathy CARDIAC: S1S2 noted,RRR, no murmurs, rubs, gallops RESPIRATORY:  Clear to auscultation without rales, wheezing or rhonchi  ABDOMEN: Soft, non-tender, non-distended, +bowel sounds, no guarding. EXTREMITIES: No edema, No cyanosis, no clubbing MUSCULOSKELETAL:  No deformity  SKIN: Warm and dry NEUROLOGIC:  Alert and oriented  x 3, non-focal PSYCHIATRIC:  Normal affect, good insight  ASSESSMENT:    1. Palpitations   2. Chest pain of uncertain etiology   3. Obesity with alveolar hypoventilation and body mass index (BMI) of 40 or greater (HCC)    PLAN:    Palpitations has improved significantly we will continue patient on her propanolol.  We discussed if needed she can take an extra dose to determine how often she will need an extra dose for increasing her propanolol.  The patient understands the need to lose weight with diet and exercise. We have discussed specific strategies for this.  I have also referred the patient to medical weight management program.  Chest pain is typical he recently had coronary CTA which showed no evidence of any artery disease.  We believe her shortness of breath is multifactorial  The patient is in agreement with the above plan. The patient left the office in stable condition.  The patient will follow up in   Medication Adjustments/Labs and Tests Ordered: Current medicines are reviewed at length with the patient today.  Concerns regarding medicines are outlined above.  Orders Placed This Encounter  Procedures  . Amb Ref to Medical Weight Management   No orders of the defined types were placed in this encounter.   Patient Instructions  Medication Instructions:  Your physician recommends that you continue  on your current medications as directed. Please refer to the Current Medication list given to you today.  *If you need a refill on your cardiac medications before your next appointment, please call your pharmacy*   Lab Work: None If you have labs (blood work) drawn today and your tests are completely normal, you will receive your results only by: Marland Kitchen MyChart Message (if you have MyChart) OR . A paper copy in the mail If you have any lab test that is abnormal or we need to change your treatment, we will call you to review the results.   Testing/Procedures: None   Follow-Up: At [facility], you and your health needs are our priority.  As part of our continuing mission to provide you with exceptional heart care, we have created designated Provider Care Teams.  These Care Teams include your primary Cardiologist (physician) and Advanced Practice Providers (APPs -  Physician Assistants and Nurse Practitioners) who all work together to provide you with the care you need, when you need it.  We recommend signing up for the patient portal called "MyChart".  Sign up information is provided on this After Visit Summary.  MyChart is used to connect with patients for Virtual Visits (Telemedicine).  Patients are able to view lab/test results, encounter notes, upcoming appointments, etc.  Non-urgent messages can be sent to your provider as well.   To learn more about what you can do with MyChart, go to [email or web address].    Your next appointment:   6 month(s)  The format for your next appointment:   In Person  Provider:   Thomasene Ripple, DO   Other Instructions      Adopting a Healthy Lifestyle.  Know what a healthy weight is for you (roughly BMI <25) and aim to maintain this   Aim for 7+ servings of fruits and vegetables daily   65-80+ fluid ounces of water or unsweet tea for healthy kidneys   Limit to max 1 drink of alcohol per day; avoid smoking/tobacco   Limit animal fats in  diet for cholesterol and heart health - choose grass fed whenever available  Avoid highly processed foods, and foods high in saturated/trans fats   Aim for low stress - take time to unwind and care for your mental health   Aim for 150 min of moderate intensity exercise weekly for heart health, and weights twice weekly for bone health   Aim for 7-9 hours of sleep daily   When it comes to diets, agreement about the perfect plan isnt easy to find, even among the experts. Experts at the [employer] developed an idea known as the Healthy Eating Plate. Just imagine a plate divided into logical, healthy portions.   The emphasis is on diet quality:   Load up on vegetables and fruits - one-half of your plate: Aim for color and variety, and remember that potatoes dont count.   Go for whole grains - one-quarter of your plate: Whole wheat, barley, wheat berries, quinoa, oats, brown rice, and foods made with them. If you want pasta, go with whole wheat pasta.   Protein power - one-quarter of your plate: Fish, chicken, beans, and nuts are all healthy, versatile protein sources. Limit red meat.   The diet, however, does go beyond the plate, offering a few other suggestions.   Use healthy plant oils, such as olive, canola, soy, corn, sunflower and peanut. Check the labels, and avoid partially hydrogenated oil, which have unhealthy trans fats.   If youre thirsty, drink water. Coffee and tea are good in moderation, but skip sugary drinks and limit milk and dairy products to one or two daily servings.   The type of carbohydrate in the diet is more important than the amount. Some sources of carbohydrates, such as vegetables, fruits, whole grains, and beans-are healthier than others.   Finally, stay active  Signed, Thomasene RippleKardie Jamil Armwood, DO  06/07/2020 2:55 PM    [facility]

## 2020-06-07 NOTE — Patient Instructions (Signed)

## 2020-06-08 ENCOUNTER — Other Ambulatory Visit: Payer: Self-pay | Admitting: Student

## 2020-06-08 NOTE — H&P (Signed)
Chief Complaint: Patient was seen in consultation today for lymph node biopsy   Referring Physician(s): Corliss Skains  Supervising Physician: Oley Balm  Patient Status: [facility] - Out-pt  History of Present Illness: Margaret Golden is a 30 y.o. female with a medical history significant for ADD, anemia, anxiety/depression and thyroid disease. She developed some exertional dyspnea in 2020 and was also discovered to be anemic. Her work up included a CT scan which showed mediastinal and hilar adenopathy. She subsequently underwent an endobronchial biopsy of the lymph nodes and pathology was negative for any malignancy. Additional imaging was obtained.   NM PET 04/27/20 IMPRESSION: 1. Multifocal hypermetabolic nodal activity throughout the mediastinum, hilar regions and porta hepatis. These lymph nodes have developed since the baseline CT of 06/29/2019. 2. Multiple nonspecific small pulmonary nodules are again noted bilaterally, stable from recent studies, but also new from baseline exam. The largest nodule in the right upper lobe is hypermetabolic. 3. Nonspecific heterogeneous and mildly increased bone marrow activity without focally suspicious lesions. No suspicious solid organ involvement. 4. These findings remain nonspecific. Given the patient's young age and previous negative biopsy, an inflammatory process such as sarcoidosis is favored. However, a lymphoproliferative process is not completely excluded, and continued CT follow-up and consideration of repeat tissue sampling recommended  Interventional Radiology has been asked to evaluate this patient for an image-guided lymph node biopsy for further work up. This case has been reviewed and procedure approved by  Dr. Deanne Coffer.   Past Medical History:  Diagnosis Date  . Acid reflux   . ADD (attention deficit disorder)   . Anemia   . Anxiety   . Arthritis    left ankle  . Back pain   . Chest pain   . Depression    . Dyspnea    with exertion  . Exercise-induced asthma   . Fatty liver   . History of blood transfusion   . History of chicken pox   . History of kidney stones    passed   . Hypothyroidism   . Joint pain   . Lower extremity edema   . Major depressive disorder   . Migraines    chronic  . Palpitations   . Pneumonia   . RLS (restless legs syndrome)   . Sleep apnea 2021  . Thyroid disease    hypothyroid    Past Surgical History:  Procedure Laterality Date  . BRONCHIAL NEEDLE ASPIRATION BIOPSY  03/31/2020   Procedure: BRONCHIAL NEEDLE ASPIRATION BIOPSIES;  Surgeon: Josephine Igo, DO;  Location: [facility];  Service: Pulmonary;;  . BRONCHIAL WASHINGS  03/31/2020   Procedure: BRONCHIAL WASHINGS;  Surgeon: Josephine Igo, DO;  Location: [facility];  Service: Pulmonary;;  . DILATATION & CURETTAGE/HYSTEROSCOPY WITH MYOSURE N/A 03/22/2020   Procedure: DILATATION & CURETTAGE/HYSTEROSCOPY WITH MYOSURE;  Surgeon: Romualdo Bolk, MD;  Location: [facility] OR;  Service: Gynecology;  Laterality: N/A;  . INTRAUTERINE DEVICE (IUD) INSERTION N/A 03/22/2020   Procedure: INTRAUTERINE DEVICE (IUD) INSERTION;  Surgeon: Romualdo Bolk, MD;  Location: [facility] OR;  Service: Gynecology;  Laterality: N/A;  Mirena IUD insertion  . KNEE ARTHROSCOPY Right 2007  . TONSILLECTOMY  2010  . TONSILLECTOMY    . VIDEO BRONCHOSCOPY WITH ENDOBRONCHIAL ULTRASOUND N/A 03/31/2020   Procedure: VIDEO BRONCHOSCOPY WITH ENDOBRONCHIAL ULTRASOUND;  Surgeon: Josephine Igo, DO;  Location: [facility];  Service: Pulmonary;  Laterality: N/A;  . WISDOM TOOTH EXTRACTION    . WISDOM TOOTH EXTRACTION Bilateral  upper and lower    Allergies: Augmentin [amoxicillin-pot clavulanate]  Medications: Prior to Admission medications   Medication Sig Start Date End Date Taking? Authorizing Provider  albuterol (VENTOLIN HFA) 108 (90 Base) MCG/ACT inhaler Inhale 2 puffs into the lungs every 6 (six) hours as needed for  wheezing or shortness of breath (Chest tightness). 04/01/19   Sharlene DoryWendling, Nicholas Paul, DO  Armodafinil 150 MG tablet Take 1 tablet (150 mg total) by mouth daily. 04/25/20   Corie Chiquitoarter, Jessica, PMHNP  aspirin-acetaminophen-caffeine (EXCEDRIN MIGRAINE) [phone] MG tablet Take 2 tablets by mouth daily as needed for headache or migraine.    [provider]  furosemide (LASIX) 20 MG tablet Take 1 tablet (20 mg total) by mouth 2 (two) times a week. 12/10/19 03/09/20  Tobb, Kardie, DO  ibuprofen (ADVIL) 800 MG tablet Take 1 tablet (800 mg total) by mouth every 8 (eight) hours as needed. 03/22/20   Romualdo BolkJertson, Jill Evelyn, MD  Iron-FA-B Cmp-C-Biot-Probiotic (FUSION PLUS) CAPS One capsule twice daily with orange juice. Stop your Ferrous sulfate. Patient taking differently: Take 1 capsule by mouth 2 (two) times daily. with orange juice. Stop your Ferrous sulfate. 06/22/19   Verner CholLeonard, Deborah S, CNM  lamoTRIgine (LAMICTAL) 200 MG tablet Take 1 tablet (200 mg total) by mouth daily. 04/21/20 07/20/20  Corie Chiquitoarter, Jessica, PMHNP  levothyroxine (SYNTHROID) 100 MCG tablet Take 100 mcg by mouth daily before breakfast. Take with the 112 mcg dose to equal 212 mcg daily    [provider]  levothyroxine (SYNTHROID) 112 MCG tablet Take 112 mcg by mouth daily before breakfast. Take with the 100 mcg dose to equal 212 mcg daily    [provider]  LORazepam (ATIVAN) 0.5 MG tablet Take 1 tablet (0.5 mg total) by mouth every 8 (eight) hours as needed for anxiety. 04/21/20   Corie Chiquitoarter, Jessica, PMHNP  Multiple Vitamins-Minerals (MULTIVITAMIN WITH MINERALS) tablet Take 1 tablet by mouth daily. 02/02/15   [name]'Sullivan, Melissa, NP  omeprazole (PRILOSEC) 20 MG capsule Take 2 capsules (40 mg total) by mouth daily. 04/01/19   Sharlene DoryWendling, Nicholas Paul, DO  potassium chloride SA (KLOR-CON) 20 MEQ tablet Take 1 tablet (20 mEq total) by mouth 2 (two) times a week. 12/10/19   Tobb, Kardie, DO  propranolol (INDERAL) 20 MG tablet Take 1  tablet (20 mg total) by mouth 3 (three) times daily. 03/04/20   Tobb, Kardie, DO  sertraline (ZOLOFT) 100 MG tablet Take 2 tablets (200 mg total) by mouth daily. 04/21/20   Corie Chiquitoarter, Jessica, PMHNP  topiramate (TOPAMAX) 50 MG tablet Start with 50mg (1 tab) at bedtime. In 1 week increase to 100mg (2 tabs) at bedtime. Patient taking differently: Take 100 mg by mouth at bedtime. 11/11/19   Anson FretAhern, Antonia B, MD  traZODone (DESYREL) 50 MG tablet Take 1/2-2 po QHS prn insomnia Patient taking differently: Take 25-100 mg by mouth at bedtime as needed for sleep. 01/05/20   Corie Chiquitoarter, Jessica, PMHNP  Vitamin D, Ergocalciferol, (DRISDOL) 1.25 MG (50000 UNIT) CAPS capsule Take 1 capsule (50,000 Units total) by mouth every 7 (seven) days. Patient taking differently: Take 50,000 Units by mouth every Monday. 12/28/19   Alois ClicheAguilar, Tracey, PA-C     Family History  Problem Relation Age of Onset  . Depression Mother   . Obesity Mother   . Depression Father   . Sleep apnea Father   . Obesity Father   . Cancer Paternal Aunt        colon  . Cancer Paternal Uncle  brain cancer  . Diabetes Maternal Grandfather   . Arthritis Maternal Grandfather   . Arthritis Maternal Grandmother   . Breast cancer Maternal Grandmother   . Arthritis Paternal Grandmother   . Arthritis Paternal Grandfather   . Migraines Neg Hx     Social History   Socioeconomic History  . Marital status: Single    Spouse name: Not on file  . Number of children: 0  . Years of education: 59  . Highest education level: Not on file  Occupational History  . Occupation: Conservation officer, historic buildings  Tobacco Use  . Smoking status: Never Smoker  . Smokeless tobacco: Never Used  Vaping Use  . Vaping Use: Never used  Substance and Sexual Activity  . Alcohol use: Not Currently    Alcohol/week: 0.0 standard drinks  . Drug use: No  . Sexual activity: Never    Comment: never sexually active  Other Topics Concern  . Not on file  Social History Narrative    Unemployed   Lives alone --update 11/11/19   Seeking work   Only child   Dog 3   Lives in a one story house with father, has a cat-12/26/16-sjb      Caffeine: 2-4 cups/day   Social Determinants of Corporate investment banker Strain: Not on file  Food Insecurity: Not on file  Transportation Needs: Not on file  Physical Activity: Not on file  Stress: Not on file  Social Connections: Not on file    Review of Systems: A 12 point ROS discussed and pertinent positives are indicated in the HPI above.  All other systems are negative.  Review of Systems  Constitutional: Positive for fatigue.  Respiratory: Positive for shortness of breath. Negative for cough.   Cardiovascular: Positive for leg swelling. Negative for chest pain.  Gastrointestinal: Negative for abdominal pain, diarrhea, nausea and vomiting.  Neurological: Positive for headaches.  Psychiatric/Behavioral: The patient is nervous/anxious.     Vital Signs: BP 135/70   Pulse 93   Temp 98.8 F (37.1 C) (Oral)   Resp 19   Ht 5\' 7"  (1.702 m)   Wt (!) 375 lb (170.1 kg)   SpO2 100%   BMI 58.73 kg/m   Physical Exam Constitutional:      General: She is not in acute distress.    Appearance: She is obese.  HENT:     Mouth/Throat:     Mouth: Mucous membranes are moist.     Pharynx: Oropharynx is clear.  Cardiovascular:     Rate and Rhythm: Normal rate and regular rhythm.  Pulmonary:     Effort: Pulmonary effort is normal.     Breath sounds: Normal breath sounds.  Abdominal:     General: Bowel sounds are normal.     Palpations: Abdomen is soft.  Skin:    General: Skin is warm and dry.  Neurological:     Mental Status: She is alert and oriented to person, place, and time.     Imaging: No results found.  Labs:  CBC: Recent Labs    01/25/20 1319 03/16/20 1500 03/31/20 1154 04/26/20 1318  WBC 9.3 7.7 9.6 9.2  HGB 14.5 12.6 13.5 13.4  HCT 43.3 40.2 43.5 41.6  PLT 194 201 188 219    COAGS: No results  for input(s): INR, APTT in the last 8760 hours.  BMP: Recent Labs    06/29/19 1153 08/12/19 1122 09/30/19 1047 03/16/20 1500 03/31/20 1154  NA 140 137 139 140 138  K 4.1  3.7 3.9 3.9 4.0  CL 105 104 107 105 105  CO2 27 21* 25 27 22   GLUCOSE 98 109* 108* 101* 91  BUN 12 8 8 8 11   CALCIUM 9.1 9.0 8.5* 9.2 8.8*  CREATININE 0.92 0.95 0.74 0.87 0.80  GFRNONAA >60 >60 >60 >60 >60  GFRAA >60 >60 >60  --   --     LIVER FUNCTION TESTS: Recent Labs    08/12/19 1122 09/30/19 1047 03/16/20 1500 03/31/20 1154  BILITOT 0.7 0.4 0.5 0.5  AST 43* 27 33 31  ALT 27 14 19 22   ALKPHOS 75 69 70 68  PROT 7.8 6.8 7.0 7.5  ALBUMIN 3.9 3.9 3.6 4.1    TUMOR MARKERS: No results for input(s): AFPTM, CEA, CA199, CHROMGRNA in the last 8760 hours.  Assessment and Plan:  Lymphadenopathy: 03/18/20 A. [birth year], 30 year old female, presents today to the [facility] Interventional Radiology department for an image-guided lymph node biopsy.   Risks and benefits of this procedure were discussed with the patient and/or patient's family including, but not limited to bleeding, infection, damage to adjacent structures or low yield requiring additional tests.  All of the questions were answered and there is agreement to proceed. She has been NPO.   Consent signed and in chart.  Thank you for this interesting consult.  I greatly enjoyed meeting Margaret Golden and look forward to participating in their care.  A copy of this report was sent to the requesting provider on this date.  Electronically Signed: 26, AGACNP-BC [phone] 06/09/2020, 7:34 AM   I spent a total of  30 Minutes   in face to face in clinical consultation, greater than 50% of which was counseling/coordinating care for lymph node biopsy

## 2020-06-08 NOTE — Progress Notes (Signed)
Phone call to pt to confirm arrival tomorrow morning at 0600 for procedure at 0800. Pt verbalized understanding of instructions.

## 2020-06-09 ENCOUNTER — Other Ambulatory Visit: Payer: Self-pay

## 2020-06-09 ENCOUNTER — Other Ambulatory Visit: Payer: Self-pay | Admitting: Thoracic Surgery (Cardiothoracic Vascular Surgery)

## 2020-06-09 ENCOUNTER — Ambulatory Visit (HOSPITAL_COMMUNITY)
Admission: RE | Admit: 2020-06-09 | Discharge: 2020-06-09 | Disposition: A | Discharge status: Home / Self Care | Payer: BC Managed Care – PPO | Source: Ambulatory Visit | Attending: Thoracic Surgery (Cardiothoracic Vascular Surgery) | Admitting: Thoracic Surgery (Cardiothoracic Vascular Surgery)

## 2020-06-09 DIAGNOSIS — R590 Localized enlarged lymph nodes: Secondary | ICD-10-CM

## 2020-06-09 DIAGNOSIS — C8528 Mediastinal (thymic) large B-cell lymphoma, lymph nodes of multiple sites: Secondary | ICD-10-CM | POA: Diagnosis not present

## 2020-06-09 LAB — PREGNANCY, URINE: Preg Test, Ur: NEGATIVE

## 2020-06-09 MED ORDER — SODIUM CHLORIDE 0.9 % IV SOLN: INTRAVENOUS | Status: DC

## 2020-06-09 MED ORDER — FENTANYL CITRATE (PF) 100 MCG/2ML IJ SOLN
INTRAMUSCULAR | Status: AC
  Filled 2020-06-09: qty 2

## 2020-06-09 MED ORDER — LIDOCAINE HCL (PF) 1 % IJ SOLN
INTRAMUSCULAR | Status: AC
  Filled 2020-06-09: qty 30

## 2020-06-09 MED ORDER — MIDAZOLAM HCL 2 MG/2ML IJ SOLN
INTRAMUSCULAR | Status: AC
  Filled 2020-06-09: qty 2

## 2020-06-09 NOTE — Sedation Documentation (Signed)
Procedure aborted per Dr. Deanne Coffer, unable to visualize LN to biopsy at this time. Patient's ride on the way. IV to be removed. No sedation given.

## 2020-06-09 NOTE — Sedation Documentation (Signed)
Patient taken to main entrance via wheelchair. Father waiting out front. Ambulatory at d/c

## 2020-06-10 ENCOUNTER — Ambulatory Visit: Payer: BC Managed Care – PPO | Admitting: Psychiatry

## 2020-06-13 ENCOUNTER — Other Ambulatory Visit (HOSPITAL_COMMUNITY): Payer: BC Managed Care – PPO

## 2020-06-13 DIAGNOSIS — F4323 Adjustment disorder with mixed anxiety and depressed mood: Secondary | ICD-10-CM | POA: Diagnosis not present

## 2020-06-15 ENCOUNTER — Ambulatory Visit (INDEPENDENT_AMBULATORY_CARE_PROVIDER_SITE_OTHER): Payer: BC Managed Care – PPO | Admitting: Family

## 2020-06-15 ENCOUNTER — Telehealth: Payer: Self-pay | Admitting: Family

## 2020-06-15 ENCOUNTER — Other Ambulatory Visit: Payer: Self-pay

## 2020-06-15 VITALS — BP 125/71 | HR 88 | Temp 98.8°F | Resp 16 | Ht 67.0 in | Wt 376.0 lb

## 2020-06-15 DIAGNOSIS — M7660 Achilles tendinitis, unspecified leg: Secondary | ICD-10-CM | POA: Diagnosis not present

## 2020-06-15 DIAGNOSIS — D649 Anemia, unspecified: Secondary | ICD-10-CM | POA: Diagnosis not present

## 2020-06-15 DIAGNOSIS — R590 Localized enlarged lymph nodes: Secondary | ICD-10-CM | POA: Diagnosis not present

## 2020-06-15 NOTE — Telephone Encounter (Signed)
See mychart.  

## 2020-06-15 NOTE — Progress Notes (Signed)
Subjective:    Patient ID: Margaret Golden, female    DOB: 1991-02-27, 30 y.o.   MRN: [id number]  HPI She presents today for an office visit accompanie by her father. She reports having pain in her R ankle and heel x 6 days ago(3/10). She notes that her symptoms occurred randomly. She has been wearing flip fops secondary to ankle swelling. She mentions having pain with walking and movement. She denies any trauma or injury.  She denies any chest pain, SOB, fever, abdominal pain, cough, chills, sore throat, dysuria, urinary incontinence, back pain, HA, or N/VD.   She notes that her bleeding has improved after undergoing an IUD procedure with her OBGYN. Her labs have been normal since this procedure. She states that she is about to resume the weight loss program next week.   Review of Systems  Constitutional: Negative for chills, fatigue and fever.  HENT: Negative for rhinorrhea, sinus pain and sore throat.   Respiratory: Negative for cough and shortness of breath.   Cardiovascular: Positive for leg swelling (bilateral). Negative for chest pain and palpitations.  Gastrointestinal: Negative for abdominal pain, diarrhea, nausea and vomiting.  Genitourinary: Negative for dysuria, flank pain and hematuria.  Musculoskeletal:       (+) R ankle and heel swelling and pain  Skin: Negative for rash.   Past Medical History:  Diagnosis Date  . Acid reflux   . ADD (attention deficit disorder)   . Anemia   . Anxiety   . Arthritis    left ankle  . Back pain   . Chest pain   . Depression   . Dyspnea    with exertion  . Exercise-induced asthma   . Fatty liver   . History of blood transfusion   . History of chicken pox   . History of kidney stones    passed   . Hypothyroidism   . Joint pain   . Lower extremity edema   . Major depressive disorder   . Migraines    chronic  . Palpitations   . Pneumonia   . RLS (restless legs syndrome)   . Sleep apnea 2021  . Thyroid disease     hypothyroid     Social History   Socioeconomic History  . Marital status: Single    Spouse name: Not on file  . Number of children: 0  . Years of education: 34  . Highest education level: Not on file  Occupational History  . Occupation: Conservation officer, historic buildings  Tobacco Use  . Smoking status: Never Smoker  . Smokeless tobacco: Never Used  Vaping Use  . Vaping Use: Never used  Substance and Sexual Activity  . Alcohol use: Not Currently    Alcohol/week: 0.0 standard drinks  . Drug use: No  . Sexual activity: Never    Comment: never sexually active  Other Topics Concern  . Not on file  Social History Narrative   Unemployed   Lives alone --update 11/11/19   Seeking work   Only child   Dog 3   Lives in a one story house with father, has a cat-12/26/16-sjb      Caffeine: 2-4 cups/day   Social Determinants of Corporate investment banker Strain: Not on file  Food Insecurity: Not on file  Transportation Needs: Not on file  Physical Activity: Not on file  Stress: Not on file  Social Connections: Not on file  Intimate Partner Violence: Not on file    Past Surgical History:  Procedure Laterality Date  . BRONCHIAL NEEDLE ASPIRATION BIOPSY  03/31/2020   Procedure: BRONCHIAL NEEDLE ASPIRATION BIOPSIES;  Surgeon: Josephine Igo, DO;  Location: [facility];  Service: Pulmonary;;  . BRONCHIAL WASHINGS  03/31/2020   Procedure: BRONCHIAL WASHINGS;  Surgeon: Josephine Igo, DO;  Location: [facility];  Service: Pulmonary;;  . DILATATION & CURETTAGE/HYSTEROSCOPY WITH MYOSURE N/A 03/22/2020   Procedure: DILATATION & CURETTAGE/HYSTEROSCOPY WITH MYOSURE;  Surgeon: Romualdo Bolk, MD;  Location: [facility] OR;  Service: Gynecology;  Laterality: N/A;  . INTRAUTERINE DEVICE (IUD) INSERTION N/A 03/22/2020   Procedure: INTRAUTERINE DEVICE (IUD) INSERTION;  Surgeon: Romualdo Bolk, MD;  Location: [facility] OR;  Service: Gynecology;  Laterality: N/A;  Mirena IUD insertion  . KNEE ARTHROSCOPY  Right 2007  . TONSILLECTOMY  2010  . TONSILLECTOMY    . VIDEO BRONCHOSCOPY WITH ENDOBRONCHIAL ULTRASOUND N/A 03/31/2020   Procedure: VIDEO BRONCHOSCOPY WITH ENDOBRONCHIAL ULTRASOUND;  Surgeon: Josephine Igo, DO;  Location: [facility];  Service: Pulmonary;  Laterality: N/A;  . WISDOM TOOTH EXTRACTION    . WISDOM TOOTH EXTRACTION Bilateral    upper and lower    Family History  Problem Relation Age of Onset  . Depression Mother   . Obesity Mother   . Depression Father   . Sleep apnea Father   . Obesity Father   . Cancer Paternal Aunt        colon  . Cancer Paternal Uncle        brain cancer  . Diabetes Maternal Grandfather   . Arthritis Maternal Grandfather   . Arthritis Maternal Grandmother   . Breast cancer Maternal Grandmother   . Arthritis Paternal Grandmother   . Arthritis Paternal Grandfather   . Migraines Neg Hx     Allergies  Allergen Reactions  . Augmentin [Amoxicillin-Pot Clavulanate] Hives    Current Outpatient Medications on File Prior to Visit  Medication Sig Dispense Refill  . albuterol (VENTOLIN HFA) 108 (90 Base) MCG/ACT inhaler Inhale 2 puffs into the lungs every 6 (six) hours as needed for wheezing or shortness of breath (Chest tightness). 18 g 0  . Armodafinil 150 MG tablet Take 1 tablet (150 mg total) by mouth daily. 30 tablet 2  . aspirin-acetaminophen-caffeine (EXCEDRIN MIGRAINE) 250-250-65 MG tablet Take 2 tablets by mouth daily as needed for headache or migraine.    . furosemide (LASIX) 20 MG tablet Take 1 tablet (20 mg total) by mouth 2 (two) times a week. 26 tablet 0  . ibuprofen (ADVIL) 800 MG tablet Take 1 tablet (800 mg total) by mouth every 8 (eight) hours as needed. 30 tablet 0  . Iron-FA-B Cmp-C-Biot-Probiotic (FUSION PLUS) CAPS One capsule twice daily with orange juice. Stop your Ferrous sulfate. (Patient taking differently: Take 1 capsule by mouth 2 (two) times daily. with orange juice. Stop your Ferrous sulfate.) 30 capsule 1  .  lamoTRIgine (LAMICTAL) 200 MG tablet Take 1 tablet (200 mg total) by mouth daily. 90 tablet 0  . levothyroxine (SYNTHROID) 100 MCG tablet Take 100 mcg by mouth daily before breakfast. Take with the 112 mcg dose to equal 212 mcg daily    . levothyroxine (SYNTHROID) 112 MCG tablet Take 112 mcg by mouth daily before breakfast. Take with the 100 mcg dose to equal 212 mcg daily    . LORazepam (ATIVAN) 0.5 MG tablet Take 1 tablet (0.5 mg total) by mouth every 8 (eight) hours as needed for anxiety. 30 tablet 1  . Multiple Vitamins-Minerals (MULTIVITAMIN WITH MINERALS) tablet Take 1  tablet by mouth daily.    Marland Kitchen omeprazole (PRILOSEC) 20 MG capsule Take 2 capsules (40 mg total) by mouth daily. 180 capsule 2  . potassium chloride SA (KLOR-CON) 20 MEQ tablet Take 1 tablet (20 mEq total) by mouth 2 (two) times a week. 26 tablet 0  . propranolol (INDERAL) 20 MG tablet Take 1 tablet (20 mg total) by mouth 3 (three) times daily. 270 tablet 3  . sertraline (ZOLOFT) 100 MG tablet Take 2 tablets (200 mg total) by mouth daily. 180 tablet 1  . topiramate (TOPAMAX) 50 MG tablet Start with 50mg (1 tab) at bedtime. In 1 week increase to 100mg (2 tabs) at bedtime. (Patient taking differently: Take 100 mg by mouth at bedtime.) 180 tablet 6  . traZODone (DESYREL) 50 MG tablet Take 1/2-2 po QHS prn insomnia (Patient taking differently: Take 25-100 mg by mouth at bedtime as needed for sleep.) 60 tablet 1  . Vitamin D, Ergocalciferol, (DRISDOL) 1.25 MG (50000 UNIT) CAPS capsule Take 1 capsule (50,000 Units total) by mouth every 7 (seven) days. (Patient taking differently: Take 50,000 Units by mouth every Monday.) 4 capsule 0   No current facility-administered medications on file prior to visit.    There were no vitals taken for this visit.      Objective:   Physical Exam Constitutional:      Appearance: Normal appearance. She is not ill-appearing.  HENT:     Head: Normocephalic and atraumatic.     Right Ear: External ear  normal.     Left Ear: External ear normal.  Cardiovascular:     Rate and Rhythm: Normal rate and regular rhythm.     Pulses: Normal pulses.     Heart sounds: Normal heart sounds. No murmur heard.   Pulmonary:     Effort: Pulmonary effort is normal. No respiratory distress.     Breath sounds: Normal breath sounds. No wheezing or rales.  Musculoskeletal:     Right lower leg: 2+ Edema present.     Left lower leg: 2+ Edema present.     Comments: Palpable tenderness in the right achillis aspect  R ankle pain with dorsiflexion    Skin:    General: Skin is warm.  Neurological:     Mental Status: She is alert and oriented to person, place, and time.  Psychiatric:        Behavior: Behavior normal.        Assessment & Plan:  Anemia  Resolved Following placement of IUD  Mediastinal lymphadenopathy Lymph nodes biopsy was unsucessful by IR I will reachout to her consultants to coordinate for next steps  Achilles Pain New  Will refer to Sports Medicine for further evaluation. She is wearing flip flops today. I recommended that she purchase a good pair of tennis shoes.  I,Alexis Bryant,acting as a for Tuesday, NP.,have documented all relevant documentation on the behalf of Neurosurgeon, NP,as directed by  [employer], NP while in the presence of Lemont Fillers, NP.  I, Lemont Fillers, NP, have reviewed all documentation for this visit. The documentation on 06/15/20 for the exam, diagnosis, procedures, and orders are all accurate and complete.

## 2020-06-17 ENCOUNTER — Encounter: Payer: Self-pay | Admitting: Thoracic Surgery (Cardiothoracic Vascular Surgery)

## 2020-06-17 ENCOUNTER — Encounter: Payer: Self-pay | Admitting: *Deleted

## 2020-06-17 ENCOUNTER — Ambulatory Visit: Payer: BC Managed Care – PPO | Admitting: Thoracic Surgery (Cardiothoracic Vascular Surgery)

## 2020-06-17 ENCOUNTER — Other Ambulatory Visit: Payer: Self-pay | Admitting: *Deleted

## 2020-06-17 ENCOUNTER — Other Ambulatory Visit: Payer: Self-pay

## 2020-06-17 VITALS — BP 136/86 | HR 90 | Resp 20 | Ht 67.0 in | Wt 375.0 lb

## 2020-06-17 DIAGNOSIS — R590 Localized enlarged lymph nodes: Secondary | ICD-10-CM

## 2020-06-17 NOTE — H&P (View-Only) (Signed)
[address]       [place] [address]             [phone]                                                   Margaret Golden [place] Medical Record #[id number] Date of Birth: April 19, 1990  Referring: Martina Sinner, MD Primary Care: [name], NP Primary Cardiologist: Thomasene Ripple, DO  Chief Complaint:        Chief Complaint  Patient presents with  . Consult    Surgical consult, PET Scan 04/27/20, Bronch 03/31/20, Chest CT 03/14/20    History of Present Illness:    Margaret Golden 30 y.o. female referred by Dr. Tonia Brooms for surgical evaluation of mediastinal adenopathy.  She first developed some exertional dyspnea and 2020 was noted to be anemic initially.  This was thought to be due to heavy menstruation cycles.  She was scheduled to undergo D&C but during her preoperative evaluation she was referred to cardiology for further work-up of her chest pain and shortness of breath.  Cross-sectional imaging that was obtained showed mediastinal and hilar adenopathy.  She subsequently underwent a endobronchial biopsy of the lymph nodes which was negative for any malignancy.  On recent PET/CT she was noted to have avidity in a supraclavicular lymph node, mediastinal and hilar lymph nodes, small pulmonary nodules, and portacaval lymph nodes.  In regards to her symptoms she remains short of breath along with some chest tightness.  She states that she has a sharp pain even with prolonged conversations which she currently has during the interview today.  Her weight has been stable.  She denies any night sweats.  She is very anxious about obtaining a diagnosis and ultimately being treated for this.  She was originally scheduled to undergo a ultrasound-guided biopsy of an avid nodule in the supraclavicular region but this was unsuccessful.  She comes in today to discuss further options.        Past Medical History:  Diagnosis Date  . Acid reflux   .  ADD (attention deficit disorder)   . Anemia   . Anxiety   . Arthritis    left ankle  . Back pain   . Chest pain   . Depression   . Dyspnea    with exertion  . Exercise-induced asthma   . Fatty liver   . History of blood transfusion   . History of chicken pox   . History of kidney stones    passed   . Hypothyroidism   . Joint pain   . Lower extremity edema   . Major depressive disorder   . Migraines    chronic  . Palpitations   . Pneumonia   . RLS (restless legs syndrome)   . Sleep apnea 2021  . Thyroid disease    hypothyroid         Past Surgical History:  Procedure Laterality Date  . BRONCHIAL NEEDLE ASPIRATION BIOPSY  03/31/2020   Procedure: BRONCHIAL NEEDLE ASPIRATION BIOPSIES;  Surgeon: Josephine Igo, DO;  Location: [facility];  Service: Pulmonary;;  . BRONCHIAL WASHINGS  03/31/2020   Procedure: BRONCHIAL WASHINGS;  Surgeon: Josephine Igo, DO;  Location: [facility];  Service: Pulmonary;;  . DILATATION & CURETTAGE/HYSTEROSCOPY WITH MYOSURE N/A  03/22/2020   Procedure: DILATATION & CURETTAGE/HYSTEROSCOPY WITH MYOSURE;  Surgeon: Romualdo BolkJertson, Jill Evelyn, MD;  [facility];  Service: Gynecology;  Laterality: N/A;  . INTRAUTERINE DEVICE (IUD) INSERTION N/A 03/22/2020   Procedure: INTRAUTERINE DEVICE (IUD) INSERTION;  Surgeon: Romualdo BolkJertson, Jill Evelyn, MD;  [facility];  Service: Gynecology;  Laterality: N/A;  Mirena IUD insertion  . KNEE ARTHROSCOPY Right 2007  . TONSILLECTOMY  2010  . TONSILLECTOMY    . VIDEO BRONCHOSCOPY WITH ENDOBRONCHIAL ULTRASOUND N/A 03/31/2020   Procedure: VIDEO BRONCHOSCOPY WITH ENDOBRONCHIAL ULTRASOUND;  Surgeon: Josephine IgoIcard, Bradley L, DO;  Location: [facility];  Service: Pulmonary;  Laterality: N/A;  . WISDOM TOOTH EXTRACTION    . WISDOM TOOTH EXTRACTION Bilateral    upper and lower         Family History  Problem Relation Age of Onset  . Depression Mother   . Obesity Mother   .  Depression Father   . Sleep apnea Father   . Obesity Father   . Cancer Paternal Aunt        colon  . Cancer Paternal Uncle        brain cancer  . Diabetes Maternal Grandfather   . Arthritis Maternal Grandfather   . Arthritis Maternal Grandmother   . Breast cancer Maternal Grandmother   . Arthritis Paternal Grandmother   . Arthritis Paternal Grandfather   . Migraines Neg Hx      Social History      Tobacco Use  Smoking Status Never Smoker  Smokeless Tobacco Never Used    Social History       Substance and Sexual Activity  Alcohol Use Not Currently  . Alcohol/week: 0.0 standard drinks         Allergies  Allergen Reactions  . Augmentin [Amoxicillin-Pot Clavulanate] Hives          Current Outpatient Medications  Medication Sig Dispense Refill  . albuterol (VENTOLIN HFA) 108 (90 Base) MCG/ACT inhaler Inhale 2 puffs into the lungs every 6 (six) hours as needed for wheezing or shortness of breath (Chest tightness). 18 g 0  . Armodafinil 150 MG tablet Take 1 tablet (150 mg total) by mouth daily. 30 tablet 2  . aspirin-acetaminophen-caffeine (EXCEDRIN MIGRAINE) 250-250-65 MG tablet Take 2 tablets by mouth daily as needed for headache or migraine.    Marland Kitchen. ibuprofen (ADVIL) 800 MG tablet Take 1 tablet (800 mg total) by mouth every 8 (eight) hours as needed. 30 tablet 0  . Iron-FA-B Cmp-C-Biot-Probiotic (FUSION PLUS) CAPS One capsule twice daily with orange juice. Stop your Ferrous sulfate. (Patient taking differently: Take 1 capsule by mouth 2 (two) times daily. with orange juice. Stop your Ferrous sulfate.) 30 capsule 1  . lamoTRIgine (LAMICTAL) 200 MG tablet Take 1 tablet (200 mg total) by mouth daily. 90 tablet 0  . levothyroxine (SYNTHROID) 100 MCG tablet Take 100 mcg by mouth daily before breakfast. Take with the 112 mcg dose to equal 212 mcg daily    . levothyroxine (SYNTHROID) 112 MCG tablet Take 112 mcg by mouth daily before breakfast. Take with  the 100 mcg dose to equal 212 mcg daily    . LORazepam (ATIVAN) 0.5 MG tablet Take 1 tablet (0.5 mg total) by mouth every 8 (eight) hours as needed for anxiety. 30 tablet 1  . Multiple Vitamins-Minerals (MULTIVITAMIN WITH MINERALS) tablet Take 1 tablet by mouth daily.    Marland Kitchen. omeprazole (PRILOSEC) 20 MG capsule Take 2 capsules (40 mg total) by mouth daily. 180 capsule 2  .  potassium chloride SA (KLOR-CON) 20 MEQ tablet Take 1 tablet (20 mEq total) by mouth 2 (two) times a week. 26 tablet 0  . propranolol (INDERAL) 20 MG tablet Take 1 tablet (20 mg total) by mouth 3 (three) times daily. 270 tablet 3  . sertraline (ZOLOFT) 100 MG tablet Take 2 tablets (200 mg total) by mouth daily. 180 tablet 1  . topiramate (TOPAMAX) 50 MG tablet Start with 50mg(1 tab) at bedtime. In 1 week increase to 100mg(2 tabs) at bedtime. (Patient taking differently: Take 100 mg by mouth at bedtime.) 180 tablet 6  . traZODone (DESYREL) 50 MG tablet Take 1/2-2 po QHS prn insomnia (Patient taking differently: Take 25-100 mg by mouth at bedtime as needed for sleep.) 60 tablet 1  . Vitamin D, Ergocalciferol, (DRISDOL) 1.25 MG (50000 UNIT) CAPS capsule Take 1 capsule (50,000 Units total) by mouth every 7 (seven) days. (Patient taking differently: Take 50,000 Units by mouth every Monday.) 4 capsule 0  . furosemide (LASIX) 20 MG tablet Take 1 tablet (20 mg total) by mouth 2 (two) times a week. 26 tablet 0   No current facility-administered medications for this visit.    Review of Systems  Constitutional: Positive for malaise/fatigue. Negative for weight loss.  Respiratory: Positive for shortness of breath. Negative for cough.   Cardiovascular: Positive for chest pain. Negative for palpitations.  Neurological: Negative.   Endo/Heme/Allergies: Negative.   Psychiatric/Behavioral: The patient is nervous/anxious.     PHYSICAL EXAMINATION: Vitals:   06/17/20 1344  BP: 136/86  Pulse: 90  Resp: 20  SpO2: 98%      Physical Exam Constitutional:      General: She is not in acute distress.    Appearance: She is obese. She is not ill-appearing or toxic-appearing.  Eyes:     Extraocular Movements: Extraocular movements intact.  Cardiovascular:     Rate and Rhythm: Normal rate.  Pulmonary:     Effort: Pulmonary effort is normal. No respiratory distress.  Abdominal:     General: There is no distension.  Musculoskeletal:        General: Normal range of motion.  Skin:    General: Skin is warm and dry.  Neurological:     General: No focal deficit present.     Mental Status: She is alert and oriented to person, place, and time.      Diagnostic Studies & Laboratory data:     Recent Radiology Findings:    Imaging Results  NM PET Image Initial (PI) Skull Base To Thigh  Result Date: 04/27/2020 CLINICAL DATA:  Initial treatment strategy for progressive thoracic adenopathy. No given history of malignancy. Patient underwent bronchoscopy with mediastinal nodal biopsy 03/31/2020, demonstrating no evidence of malignancy. EXAM: NUCLEAR MEDICINE PET SKULL BASE TO THIGH TECHNIQUE: 16.03 mCi F-18 FDG was injected intravenously. Full-ring PET imaging was performed from the skull base to thigh after the radiotracer. CT data was obtained and used for attenuation correction and anatomic localization. Fasting blood glucose: 92 mg/dl COMPARISON:  Chest CT 03/14/2020, 12/17/2019 and 06/29/2019. FINDINGS: Mediastinal blood pool activity: SUV max 1.2 Liver activity: SUV max 5.4 NECK: Single hypermetabolic right supraclavicular lymph node is not pathologically enlarged, demonstrating an SUV max of 6.2. No other hypermetabolic cervical lymph nodes are identified.There are no lesions of the pharyngeal mucosal space. Activity within the lymphoid tissue of Waldeyer's ring is within physiologic limits. Incidental CT findings: none CHEST: As demonstrated on recent CT, there are multiple mildly enlarged mediastinal and hilar  lymph nodes   bilaterally which are moderately hypermetabolic. For example, there is a right superior hilar node with an SUV max of 13.3, a subcarinal node with an SUV max of 10.1 and a left infrahilar node with an SUV max of 13.2. There are no hypermetabolic axillary lymph nodes. 7 mm right upper lobe nodule on image 14/8 is hypermetabolic with an SUV max of 7.6. There is also focal hypermetabolic activity posterior to the right hilum (SUV max 5.9), without definite corresponding nodule on the CT images. Multiple other smaller pulmonary nodules are unchanged in size, without definite hypermetabolic activity, although too small to optimally evaluate by PET. Incidental CT findings: Small hiatal hernia. ABDOMEN/PELVIS: There is no hypermetabolic activity within the liver, adrenal glands, spleen or pancreas. There is focal hypermetabolic activity within the porta hepatis (SUV max 11.8), likely corresponding with mildly enlarged portacaval lymph nodes. There are no hypermetabolic lymph nodes more inferiorly within the abdomen or pelvis. No bowel lesions identified. Incidental CT findings: Nonobstructing bilateral renal calculi. 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 An intrauterine device is noted. SKELETON: There is mildly increased and heterogeneous metabolic activity throughout the bones, especially in the spine. For example, in the lower thoracic region, there is an SUV max of 7.4. No focal lesions are identified. Incidental CT findings: none IMPRESSION: 1. Multifocal hypermetabolic nodal activity throughout the mediastinum, hilar regions and porta hepatis. These lymph nodes have developed since the baseline CT of 06/29/2019. 2. Multiple nonspecific small pulmonary nodules are again noted bilaterally, stable from recent studies, but also new from baseline exam. The largest nodule in the right upper lobe is hypermetabolic. 3. Nonspecific heterogeneous and mildly increased bone marrow activity without  focally suspicious lesions. No suspicious solid organ involvement. 4. These findings remain nonspecific. Given the patient's young age and previous negative biopsy, an inflammatory process such as sarcoidosis is favored. However, a lymphoproliferative process is not completely excluded, and continued CT follow-up and consideration of repeat tissue sampling recommended. Electronically Signed   By: Carey Bullocks M.D.   On: 04/27/2020 14:08        I have independently reviewed the above radiology studies  and reviewed the findings with the patient.   Recent Lab Findings: Recent Labs       Lab Results  Component Value Date   WBC 9.2 04/26/2020   HGB 13.4 04/26/2020   HCT 41.6 04/26/2020   PLT 219 04/26/2020   GLUCOSE 91 03/31/2020   CHOL 150 09/03/2019   TRIG 120 09/03/2019   HDL 39 (L) 09/03/2019   LDLCALC 89 09/03/2019   ALT 22 03/31/2020   AST 31 03/31/2020   NA 138 03/31/2020   K 4.0 03/31/2020   CL 105 03/31/2020   CREATININE 0.80 03/31/2020   BUN 11 03/31/2020   CO2 22 03/31/2020   TSH 0.66 11/20/2019   HGBA1C 4.9 09/03/2019         Problem List: Right supraclavicular adenopathy with an SUV of 6.2 Bilateral hilar lymphadenopathy with increased avidity Subcarinal lymphadenopathy with avidity of 13.2 5 mm right upper lobe pulmonary nodule with SUV of 7.6 Portal hepatis avidity with SUV max of 11.8   Assessment / Plan:   30 year old female mediastinal and hilar lymphadenopathy along with supraclavicular lymphadenopathy as well as portal caval lymphadenopathy.  Based off of the pet imaging this is very concerning for lymphoma.  She has undergone a navigational bronchoscopy which was nondiagnostic.  She has been to discuss potential for surgical biopsy.  She is  very morbidly obese with a BMI of 58.  Given that she was unable to undergo the ultrasound-guided biopsy we will plan for a mediastinoscopy and possible right VATS lymph node biopsy.   She is agreed to proceed and is tentatively scheduled for the next few weeks.  Latasha Buczkowski Ke[place] Scrape

## 2020-06-17 NOTE — Progress Notes (Signed)
[address]       [place] [address]             [phone]                                                   Margaret Golden [place] Medical Record #[id number] Date of Birth: April 19, 1990  Referring: Martina Sinner, MD Primary Care: [name], NP Primary Cardiologist: Thomasene Ripple, DO  Chief Complaint:        Chief Complaint  Patient presents with  . Consult    Surgical consult, PET Scan 04/27/20, Bronch 03/31/20, Chest CT 03/14/20    History of Present Illness:    Margaret Golden 30 y.o. female referred by Dr. Tonia Brooms for surgical evaluation of mediastinal adenopathy.  She first developed some exertional dyspnea and 2020 was noted to be anemic initially.  This was thought to be due to heavy menstruation cycles.  She was scheduled to undergo D&C but during her preoperative evaluation she was referred to cardiology for further work-up of her chest pain and shortness of breath.  Cross-sectional imaging that was obtained showed mediastinal and hilar adenopathy.  She subsequently underwent a endobronchial biopsy of the lymph nodes which was negative for any malignancy.  On recent PET/CT she was noted to have avidity in a supraclavicular lymph node, mediastinal and hilar lymph nodes, small pulmonary nodules, and portacaval lymph nodes.  In regards to her symptoms she remains short of breath along with some chest tightness.  She states that she has a sharp pain even with prolonged conversations which she currently has during the interview today.  Her weight has been stable.  She denies any night sweats.  She is very anxious about obtaining a diagnosis and ultimately being treated for this.  She was originally scheduled to undergo a ultrasound-guided biopsy of an avid nodule in the supraclavicular region but this was unsuccessful.  She comes in today to discuss further options.        Past Medical History:  Diagnosis Date  . Acid reflux   .  ADD (attention deficit disorder)   . Anemia   . Anxiety   . Arthritis    left ankle  . Back pain   . Chest pain   . Depression   . Dyspnea    with exertion  . Exercise-induced asthma   . Fatty liver   . History of blood transfusion   . History of chicken pox   . History of kidney stones    passed   . Hypothyroidism   . Joint pain   . Lower extremity edema   . Major depressive disorder   . Migraines    chronic  . Palpitations   . Pneumonia   . RLS (restless legs syndrome)   . Sleep apnea 2021  . Thyroid disease    hypothyroid         Past Surgical History:  Procedure Laterality Date  . BRONCHIAL NEEDLE ASPIRATION BIOPSY  03/31/2020   Procedure: BRONCHIAL NEEDLE ASPIRATION BIOPSIES;  Surgeon: Josephine Igo, DO;  Location: [facility];  Service: Pulmonary;;  . BRONCHIAL WASHINGS  03/31/2020   Procedure: BRONCHIAL WASHINGS;  Surgeon: Josephine Igo, DO;  Location: [facility];  Service: Pulmonary;;  . DILATATION & CURETTAGE/HYSTEROSCOPY WITH MYOSURE N/A  03/22/2020   Procedure: DILATATION & CURETTAGE/HYSTEROSCOPY WITH MYOSURE;  Surgeon: Romualdo BolkJertson, Jill Evelyn, MD;  [facility];  Service: Gynecology;  Laterality: N/A;  . INTRAUTERINE DEVICE (IUD) INSERTION N/A 03/22/2020   Procedure: INTRAUTERINE DEVICE (IUD) INSERTION;  Surgeon: Romualdo BolkJertson, Jill Evelyn, MD;  [facility];  Service: Gynecology;  Laterality: N/A;  Mirena IUD insertion  . KNEE ARTHROSCOPY Right 2007  . TONSILLECTOMY  2010  . TONSILLECTOMY    . VIDEO BRONCHOSCOPY WITH ENDOBRONCHIAL ULTRASOUND N/A 03/31/2020   Procedure: VIDEO BRONCHOSCOPY WITH ENDOBRONCHIAL ULTRASOUND;  Surgeon: Josephine IgoIcard, Bradley L, DO;  Location: [facility];  Service: Pulmonary;  Laterality: N/A;  . WISDOM TOOTH EXTRACTION    . WISDOM TOOTH EXTRACTION Bilateral    upper and lower         Family History  Problem Relation Age of Onset  . Depression Mother   . Obesity Mother   .  Depression Father   . Sleep apnea Father   . Obesity Father   . Cancer Paternal Aunt        colon  . Cancer Paternal Uncle        brain cancer  . Diabetes Maternal Grandfather   . Arthritis Maternal Grandfather   . Arthritis Maternal Grandmother   . Breast cancer Maternal Grandmother   . Arthritis Paternal Grandmother   . Arthritis Paternal Grandfather   . Migraines Neg Hx      Social History      Tobacco Use  Smoking Status Never Smoker  Smokeless Tobacco Never Used    Social History       Substance and Sexual Activity  Alcohol Use Not Currently  . Alcohol/week: 0.0 standard drinks         Allergies  Allergen Reactions  . Augmentin [Amoxicillin-Pot Clavulanate] Hives          Current Outpatient Medications  Medication Sig Dispense Refill  . albuterol (VENTOLIN HFA) 108 (90 Base) MCG/ACT inhaler Inhale 2 puffs into the lungs every 6 (six) hours as needed for wheezing or shortness of breath (Chest tightness). 18 g 0  . Armodafinil 150 MG tablet Take 1 tablet (150 mg total) by mouth daily. 30 tablet 2  . aspirin-acetaminophen-caffeine (EXCEDRIN MIGRAINE) 250-250-65 MG tablet Take 2 tablets by mouth daily as needed for headache or migraine.    Marland Kitchen. ibuprofen (ADVIL) 800 MG tablet Take 1 tablet (800 mg total) by mouth every 8 (eight) hours as needed. 30 tablet 0  . Iron-FA-B Cmp-C-Biot-Probiotic (FUSION PLUS) CAPS One capsule twice daily with orange juice. Stop your Ferrous sulfate. (Patient taking differently: Take 1 capsule by mouth 2 (two) times daily. with orange juice. Stop your Ferrous sulfate.) 30 capsule 1  . lamoTRIgine (LAMICTAL) 200 MG tablet Take 1 tablet (200 mg total) by mouth daily. 90 tablet 0  . levothyroxine (SYNTHROID) 100 MCG tablet Take 100 mcg by mouth daily before breakfast. Take with the 112 mcg dose to equal 212 mcg daily    . levothyroxine (SYNTHROID) 112 MCG tablet Take 112 mcg by mouth daily before breakfast. Take with  the 100 mcg dose to equal 212 mcg daily    . LORazepam (ATIVAN) 0.5 MG tablet Take 1 tablet (0.5 mg total) by mouth every 8 (eight) hours as needed for anxiety. 30 tablet 1  . Multiple Vitamins-Minerals (MULTIVITAMIN WITH MINERALS) tablet Take 1 tablet by mouth daily.    Marland Kitchen. omeprazole (PRILOSEC) 20 MG capsule Take 2 capsules (40 mg total) by mouth daily. 180 capsule 2  .  potassium chloride SA (KLOR-CON) 20 MEQ tablet Take 1 tablet (20 mEq total) by mouth 2 (two) times a week. 26 tablet 0  . propranolol (INDERAL) 20 MG tablet Take 1 tablet (20 mg total) by mouth 3 (three) times daily. 270 tablet 3  . sertraline (ZOLOFT) 100 MG tablet Take 2 tablets (200 mg total) by mouth daily. 180 tablet 1  . topiramate (TOPAMAX) 50 MG tablet Start with (1 tab) at bedtime. In 1 week increase to (2 tabs) at bedtime. (Patient taking differently: Take 100 mg by mouth at bedtime.) 180 tablet 6  . traZODone (DESYREL) 50 MG tablet Take 1/2-2 po QHS prn insomnia (Patient taking differently: Take 25-100 mg by mouth at bedtime as needed for sleep.) 60 tablet 1  . Vitamin D, Ergocalciferol, (DRISDOL) 1.25 MG (50000 UNIT) CAPS capsule Take 1 capsule (50,000 Units total) by mouth every 7 (seven) days. (Patient taking differently: Take 50,000 Units by mouth every Monday.) 4 capsule 0  . furosemide (LASIX) 20 MG tablet Take 1 tablet (20 mg total) by mouth 2 (two) times a week. 26 tablet 0   No current facility-administered medications for this visit.    Review of Systems  Constitutional: Positive for malaise/fatigue. Negative for weight loss.  Respiratory: Positive for shortness of breath. Negative for cough.   Cardiovascular: Positive for chest pain. Negative for palpitations.  Neurological: Negative.   Endo/Heme/Allergies: Negative.   Psychiatric/Behavioral: The patient is nervous/anxious.     PHYSICAL EXAMINATION: Vitals:   06/17/20 1344  BP: 136/86  Pulse: 90  Resp: 20  SpO2: 98%      Physical Exam Constitutional:      General: She is not in acute distress.    Appearance: She is obese. She is not ill-appearing or toxic-appearing.  Eyes:     Extraocular Movements: Extraocular movements intact.  Cardiovascular:     Rate and Rhythm: Normal rate.  Pulmonary:     Effort: Pulmonary effort is normal. No respiratory distress.  Abdominal:     General: There is no distension.  Musculoskeletal:        General: Normal range of motion.  Skin:    General: Skin is warm and dry.  Neurological:     General: No focal deficit present.     Mental Status: She is alert and oriented to person, place, and time.      Diagnostic Studies & Laboratory data:     Recent Radiology Findings:    Imaging Results  NM PET Image Initial (PI) Skull Base To Thigh  Result Date: 04/27/2020 CLINICAL DATA:  Initial treatment strategy for progressive thoracic adenopathy. No given history of malignancy. Patient underwent bronchoscopy with mediastinal nodal biopsy 03/31/2020, demonstrating no evidence of malignancy. EXAM: NUCLEAR MEDICINE PET SKULL BASE TO THIGH TECHNIQUE: 16.03 mCi F-18 FDG was injected intravenously. Full-ring PET imaging was performed from the skull base to thigh after the radiotracer. CT data was obtained and used for attenuation correction and anatomic localization. Fasting blood glucose: 92 mg/dl COMPARISON:  Chest CT 84/13/2440, 12/17/2019 and 06/29/2019. FINDINGS: Mediastinal blood pool activity: SUV max 1.2 Liver activity: SUV max 5.4 NECK: Single hypermetabolic right supraclavicular lymph node is not pathologically enlarged, demonstrating an SUV max of 6.2. No other hypermetabolic cervical lymph nodes are identified.There are no lesions of the pharyngeal mucosal space. Activity within the lymphoid tissue of Waldeyer's ring is within physiologic limits. Incidental CT findings: none CHEST: As demonstrated on recent CT, there are multiple mildly enlarged mediastinal and hilar  lymph nodes  bilaterally which are moderately hypermetabolic. For example, there is a right superior hilar node with an SUV max of 13.3, a subcarinal node with an SUV max of 10.1 and a left infrahilar node with an SUV max of 13.2. There are no hypermetabolic axillary lymph nodes. 7 mm right upper lobe nodule on image 14/8 is hypermetabolic with an SUV max of 7.6. There is also focal hypermetabolic activity posterior to the right hilum (SUV max 5.9), without definite corresponding nodule on the CT images. Multiple other smaller pulmonary nodules are unchanged in size, without definite hypermetabolic activity, although too small to optimally evaluate by PET. Incidental CT findings: Small hiatal hernia. ABDOMEN/PELVIS: There is no hypermetabolic activity within the liver, adrenal glands, spleen or pancreas. There is focal hypermetabolic activity within the porta hepatis (SUV max 11.8), likely corresponding with mildly enlarged portacaval lymph nodes. There are no hypermetabolic lymph nodes more inferiorly within the abdomen or pelvis. No bowel lesions identified. Incidental CT findings: Nonobstructing bilateral renal calculi. 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 An intrauterine device is noted. SKELETON: There is mildly increased and heterogeneous metabolic activity throughout the bones, especially in the spine. For example, in the lower thoracic region, there is an SUV max of 7.4. No focal lesions are identified. Incidental CT findings: none IMPRESSION: 1. Multifocal hypermetabolic nodal activity throughout the mediastinum, hilar regions and porta hepatis. These lymph nodes have developed since the baseline CT of 06/29/2019. 2. Multiple nonspecific small pulmonary nodules are again noted bilaterally, stable from recent studies, but also new from baseline exam. The largest nodule in the right upper lobe is hypermetabolic. 3. Nonspecific heterogeneous and mildly increased bone marrow activity without  focally suspicious lesions. No suspicious solid organ involvement. 4. These findings remain nonspecific. Given the patient's young age and previous negative biopsy, an inflammatory process such as sarcoidosis is favored. However, a lymphoproliferative process is not completely excluded, and continued CT follow-up and consideration of repeat tissue sampling recommended. Electronically Signed   By: Carey Bullocks M.D.   On: 04/27/2020 14:08        I have independently reviewed the above radiology studies  and reviewed the findings with the patient.   Recent Lab Findings: Recent Labs       Lab Results  Component Value Date   WBC 9.2 04/26/2020   HGB 13.4 04/26/2020   HCT 41.6 04/26/2020   PLT 219 04/26/2020   GLUCOSE 91 03/31/2020   CHOL 150 09/03/2019   TRIG 120 09/03/2019   HDL 39 (L) 09/03/2019   LDLCALC 89 09/03/2019   ALT 22 03/31/2020   AST 31 03/31/2020   NA 138 03/31/2020   K 4.0 03/31/2020   CL 105 03/31/2020   CREATININE 0.80 03/31/2020   BUN 11 03/31/2020   CO2 22 03/31/2020   TSH 0.66 11/20/2019   HGBA1C 4.9 09/03/2019         Problem List: Right supraclavicular adenopathy with an SUV of 6.2 Bilateral hilar lymphadenopathy with increased avidity Subcarinal lymphadenopathy with avidity of 13.2 5 mm right upper lobe pulmonary nodule with SUV of 7.6 Portal hepatis avidity with SUV max of 11.8   Assessment / Plan:   30 year old female mediastinal and hilar lymphadenopathy along with supraclavicular lymphadenopathy as well as portal caval lymphadenopathy.  Based off of the pet imaging this is very concerning for lymphoma.  She has undergone a navigational bronchoscopy which was nondiagnostic.  She has been to discuss potential for surgical biopsy.  She is  very morbidly obese with a BMI of 58.  Given that she was unable to undergo the ultrasound-guided biopsy we will plan for a mediastinoscopy and possible right VATS lymph node biopsy.   She is agreed to proceed and is tentatively scheduled for the next few weeks.  Kenia Teagarden Keane Scrape

## 2020-06-21 ENCOUNTER — Ambulatory Visit: Payer: BC Managed Care – PPO | Admitting: Family Medicine

## 2020-06-21 DIAGNOSIS — F4323 Adjustment disorder with mixed anxiety and depressed mood: Secondary | ICD-10-CM | POA: Diagnosis not present

## 2020-06-21 NOTE — Progress Notes (Deleted)
Margaret Golden - 30 y.o. female MRN [id number]  Date of birth: 1991-03-14  SUBJECTIVE:  Including CC & ROS.  No chief complaint on file.   Margaret Golden is a 30 y.o. female that is  ***.  ***   Review of Systems See HPI   HISTORY: Past Medical, Surgical, Social, and Family History Reviewed & Updated per EMR.   Pertinent Historical Findings include:  Past Medical History:  Diagnosis Date  . Acid reflux   . ADD (attention deficit disorder)   . Anemia   . Anxiety   . Arthritis    left ankle  . Back pain   . Chest pain   . Depression   . Dyspnea    with exertion  . Exercise-induced asthma   . Fatty liver   . History of blood transfusion   . History of chicken pox   . History of kidney stones    passed   . Hypothyroidism   . Joint pain   . Lower extremity edema   . Major depressive disorder   . Migraines    chronic  . Palpitations   . Pneumonia   . RLS (restless legs syndrome)   . Sleep apnea 2021  . Thyroid disease    hypothyroid    Past Surgical History:  Procedure Laterality Date  . BRONCHIAL NEEDLE ASPIRATION BIOPSY  03/31/2020   Procedure: BRONCHIAL NEEDLE ASPIRATION BIOPSIES;  Surgeon: Josephine Igo, DO;  Location: [facility];  Service: Pulmonary;;  . BRONCHIAL WASHINGS  03/31/2020   Procedure: BRONCHIAL WASHINGS;  Surgeon: Josephine Igo, DO;  Location: [facility];  Service: Pulmonary;;  . DILATATION & CURETTAGE/HYSTEROSCOPY WITH MYOSURE N/A 03/22/2020   Procedure: DILATATION & CURETTAGE/HYSTEROSCOPY WITH MYOSURE;  Surgeon: Romualdo Bolk, MD;  Location: [facility];  Service: Gynecology;  Laterality: N/A;  . INTRAUTERINE DEVICE (IUD) INSERTION N/A 03/22/2020   Procedure: INTRAUTERINE DEVICE (IUD) INSERTION;  Surgeon: Romualdo Bolk, MD;  Location: [facility] OR;  Service: Gynecology;  Laterality: N/A;  Mirena IUD insertion  . KNEE ARTHROSCOPY Right 2007  . TONSILLECTOMY  2010  . TONSILLECTOMY    . VIDEO BRONCHOSCOPY WITH ENDOBRONCHIAL  ULTRASOUND N/A 03/31/2020   Procedure: VIDEO BRONCHOSCOPY WITH ENDOBRONCHIAL ULTRASOUND;  Surgeon: Josephine Igo, DO;  Location: [facility];  Service: Pulmonary;  Laterality: N/A;  . WISDOM TOOTH EXTRACTION    . WISDOM TOOTH EXTRACTION Bilateral    upper and lower    Family History  Problem Relation Age of Onset  . Depression Mother   . Obesity Mother   . Depression Father   . Sleep apnea Father   . Obesity Father   . Cancer Paternal Aunt        colon  . Cancer Paternal Uncle        brain cancer  . Diabetes Maternal Grandfather   . Arthritis Maternal Grandfather   . Arthritis Maternal Grandmother   . Breast cancer Maternal Grandmother   . Arthritis Paternal Grandmother   . Arthritis Paternal Grandfather   . Migraines Neg Hx     Social History   Socioeconomic History  . Marital status: Single    Spouse name: Not on file  . Number of children: 0  . Years of education: 5  . Highest education level: Not on file  Occupational History  . Occupation: Conservation officer, historic buildings  Tobacco Use  . Smoking status: Never Smoker  . Smokeless tobacco: Never Used  Vaping Use  . Vaping Use: Never used  Substance and Sexual Activity  . Alcohol use: Not Currently    Alcohol/week: 0.0 standard drinks  . Drug use: No  . Sexual activity: Never    Comment: never sexually active  Other Topics Concern  . Not on file  Social History Narrative   Unemployed   Lives alone --update 11/11/19   Seeking work   Only child   Dog 3   Lives in a one story house with father, has a cat-12/26/16-sjb      Caffeine: 2-4 cups/day   Social Determinants of Corporate investment banker Strain: Not on file  Food Insecurity: Not on file  Transportation Needs: Not on file  Physical Activity: Not on file  Stress: Not on file  Social Connections: Not on file  Intimate Partner Violence: Not on file     PHYSICAL EXAM:  VS: There were no vitals taken for this visit. Physical Exam Gen: NAD, alert,  cooperative with exam, well-appearing MSK:  ***      ASSESSMENT & PLAN:   No problem-specific Assessment & Plan notes found for this encounter.

## 2020-06-22 ENCOUNTER — Encounter (INDEPENDENT_AMBULATORY_CARE_PROVIDER_SITE_OTHER): Payer: Self-pay | Admitting: Physician Assistant

## 2020-06-22 ENCOUNTER — Other Ambulatory Visit: Payer: Self-pay

## 2020-06-22 ENCOUNTER — Ambulatory Visit (INDEPENDENT_AMBULATORY_CARE_PROVIDER_SITE_OTHER): Payer: BC Managed Care – PPO | Admitting: Physician Assistant

## 2020-06-22 VITALS — BP 120/72 | HR 100 | Temp 98.4°F | Ht 67.0 in | Wt 368.0 lb

## 2020-06-22 DIAGNOSIS — E559 Vitamin D deficiency, unspecified: Secondary | ICD-10-CM | POA: Diagnosis not present

## 2020-06-22 DIAGNOSIS — E8881 Metabolic syndrome: Secondary | ICD-10-CM | POA: Diagnosis not present

## 2020-06-22 DIAGNOSIS — Z6841 Body Mass Index (BMI) 40.0 and over, adult: Secondary | ICD-10-CM | POA: Diagnosis not present

## 2020-06-22 DIAGNOSIS — Z9189 Other specified personal risk factors, not elsewhere classified: Secondary | ICD-10-CM

## 2020-06-22 MED ORDER — VITAMIN D (ERGOCALCIFEROL) 1.25 MG (50000 UNIT) PO CAPS
50000.0000 [IU] | ORAL_CAPSULE | ORAL | 0 refills | Status: DC
Start: 2020-06-23 — End: 2020-08-04

## 2020-06-23 ENCOUNTER — Other Ambulatory Visit: Payer: Self-pay

## 2020-06-23 ENCOUNTER — Encounter (HOSPITAL_COMMUNITY)
Admission: RE | Admit: 2020-06-23 | Discharge: 2020-06-23 | Disposition: A | Discharge status: Home / Self Care | Payer: BC Managed Care – PPO | Source: Ambulatory Visit | Attending: Thoracic Surgery (Cardiothoracic Vascular Surgery) | Admitting: Thoracic Surgery (Cardiothoracic Vascular Surgery)

## 2020-06-23 ENCOUNTER — Other Ambulatory Visit (HOSPITAL_COMMUNITY)
Admission: RE | Admit: 2020-06-23 | Discharge: 2020-06-23 | Disposition: A | Discharge status: Home / Self Care | Payer: BC Managed Care – PPO | Source: Ambulatory Visit | Attending: Thoracic Surgery (Cardiothoracic Vascular Surgery) | Admitting: Thoracic Surgery (Cardiothoracic Vascular Surgery)

## 2020-06-23 ENCOUNTER — Encounter (HOSPITAL_COMMUNITY): Payer: Self-pay

## 2020-06-23 DIAGNOSIS — Z01812 Encounter for preprocedural laboratory examination: Secondary | ICD-10-CM | POA: Diagnosis not present

## 2020-06-23 DIAGNOSIS — Z20822 Contact with and (suspected) exposure to covid-19: Secondary | ICD-10-CM | POA: Insufficient documentation

## 2020-06-23 DIAGNOSIS — Z01818 Encounter for other preprocedural examination: Secondary | ICD-10-CM

## 2020-06-23 DIAGNOSIS — R590 Localized enlarged lymph nodes: Secondary | ICD-10-CM

## 2020-06-23 HISTORY — DX: Other complications of anesthesia, initial encounter: T88.59XA

## 2020-06-23 HISTORY — DX: Other specified postprocedural states: R11.2

## 2020-06-23 HISTORY — DX: Other specified postprocedural states: Z98.890

## 2020-06-23 LAB — BLOOD GAS, ARTERIAL
Acid-base deficit: 0.5 mmol/L (ref 0.0–2.0)
Bicarbonate: 23 mmol/L (ref 20.0–28.0)
Drawn by: 58793
FIO2: 21
O2 Saturation: 98.3 %
Patient temperature: 37
pCO2 arterial: 33.9 mmHg (ref 32.0–48.0)
pH, Arterial: 7.446 (ref 7.350–7.450)
pO2, Arterial: 102 mmHg (ref 83.0–108.0)

## 2020-06-23 LAB — COMPREHENSIVE METABOLIC PANEL
ALT: 28 U/L (ref 0–44)
AST: 46 U/L — ABNORMAL HIGH (ref 15–41)
Albumin: 3.8 g/dL (ref 3.5–5.0)
Alkaline Phosphatase: 74 U/L (ref 38–126)
Anion gap: 9 (ref 5–15)
BUN: 9 mg/dL (ref 6–20)
CO2: 24 mmol/L (ref 22–32)
Calcium: 8.7 mg/dL — ABNORMAL LOW (ref 8.9–10.3)
Chloride: 103 mmol/L (ref 98–111)
Creatinine, Ser: 0.77 mg/dL (ref 0.44–1.00)
GFR, Estimated: >60 mL/min (ref >60)
Glucose, Bld: 124 mg/dL — ABNORMAL HIGH (ref 70–99)
Potassium: 3.8 mmol/L (ref 3.5–5.1)
Sodium: 136 mmol/L (ref 135–145)
Total Bilirubin: 0.8 mg/dL (ref 0.3–1.2)
Total Protein: 7.3 g/dL (ref 6.5–8.1)

## 2020-06-23 LAB — TYPE AND SCREEN
ABO/RH(D): O POS
Antibody Screen: NEGATIVE

## 2020-06-23 LAB — URINALYSIS, ROUTINE W REFLEX MICROSCOPIC
Bilirubin Urine: NEGATIVE
Glucose, UA: NEGATIVE mg/dL
Hgb urine dipstick: NEGATIVE
Ketones, ur: NEGATIVE mg/dL
Nitrite: NEGATIVE
Protein, ur: 30 mg/dL — AB
Specific Gravity, Urine: 1.02 (ref 1.005–1.030)
pH: 7 (ref 5.0–8.0)

## 2020-06-23 LAB — PROTIME-INR
INR: 1 (ref 0.8–1.2)
Prothrombin Time: 13.1 seconds (ref 11.4–15.2)

## 2020-06-23 LAB — CBC
HCT: 44 % (ref 36.0–46.0)
Hemoglobin: 14.8 g/dL (ref 12.0–15.0)
MCH: 28.8 pg (ref 26.0–34.0)
MCHC: 33.6 g/dL (ref 30.0–36.0)
MCV: 85.6 fL (ref 80.0–100.0)
Platelets: 214 10*3/uL (ref 150–400)
RBC: 5.14 MIL/uL — ABNORMAL HIGH (ref 3.87–5.11)
RDW: 14.6 % (ref 11.5–15.5)
WBC: 8.5 10*3/uL (ref 4.0–10.5)
nRBC: 0 % (ref 0.0–0.2)

## 2020-06-23 LAB — SURGICAL PCR SCREEN
MRSA, PCR: NEGATIVE
Staphylococcus aureus: NEGATIVE

## 2020-06-23 LAB — APTT: aPTT: 31 seconds (ref 24–36)

## 2020-06-23 LAB — SARS CORONAVIRUS 2 (TAT 6-24 HRS): SARS Coronavirus 2: NEGATIVE

## 2020-06-23 MED ORDER — VANCOMYCIN HCL 1500 MG/300ML IV SOLN: 1500.0000 mg | INTRAVENOUS | Status: DC

## 2020-06-23 NOTE — Progress Notes (Addendum)
PCP - Daryel Gerald,  NP  Cardiologist - Dr. Arnetha Courser, DO  Chest x-ray - DOS- 72 hours prior to surgery  EKG - 06/23/20  Stress Test - no  ECHO - 09/2019  Cardiac Cath - no  7 Day cardiac monitor- 08/2019-NonSubstained VT  Sleep Study - 02/10/2020- CPAP - needs it, but office has not been back in touch with patient  LABS-CN+BC, CMP, PT, PTT, T/S, UA with micro, PCR, COvid.  ASA-no  ERAS-no  HA1C-na Fasting Blood Sugar - na Checks Blood Sugar _0____ times a day  Anesthesia-  Pt denies having chest pain, sob, or fever at this time. All instructions explained to the pt, with a verbal understanding of the material. Pt agrees to go over the instructions while at home for a better understanding. Pt also instructed to self quarantine after being tested for COVID-19. The opportunity to ask questions was provided.  I left a voice message for Darius Bump, RN, with urine results; Moderate Leukocytes, Few BACTERIA

## 2020-06-23 NOTE — Progress Notes (Signed)
[name] denies chest pain or shortness of breath. Patient was tested for Covid and has been in quarantine since that time.  PCP -   Cardiologist -   Chest x-ray -   EKG -   Stress Test -   ECHO - 09/2019  Cardiac Cath -    Sleep Study -  CPAP -   LABS-CBC, CMP, PT, PTT, T/S, UA, PCR  ASA-no  ERAS-no  HA1C- Fasting Blood Sugar -  Checks Blood Sugar _____ times a day  Anesthesia-  Pt denies having chest pain, sob, or fever at this time. All instructions explained to the pt, with a verbal understanding of the material. Pt agrees to go over the instructions while at home for a better understanding. Pt also instructed to self quarantine after being tested for COVID-19. The opportunity to ask questions was provided. ^

## 2020-06-23 NOTE — Pre-Procedure Instructions (Addendum)
Margaret Golden  06/23/2020      Your procedure is scheduled on Monday, March 28.  Report to [facility], Main Entrance or Entrance "A" at 9:55 AM .                Your surgery or procedure is scheduled to begin at 11:57 AM   Call this number if you have problems the morning of surgery: [phone]  This is the number for the Pre- Surgical Desk.                For any other questions, please call [phone], Monday - Friday 8 AM - 4 PM.   Remember:  Do not eat or drink after midnight.   Take these medicines the morning of surgery with A SIP OF WATER: lamoTRIgine (LAMICTAL) levothyroxine (SYNTHROID) omeprazole (PRILOSEC) sertraline (ZOLOFT)  propranolol (INDERAL)   If needed:  use Albuterol Inhaler- bring it with you   1 Week prior to surgery STOP taking Aspirin, Aspirin Products (Goody Powder, Excedrin Migraine), Ibuprofen (Advil), Naproxen (Aleve), Vitamins and Herbal Products (ie Fish Oil).    Special instructions:    [place]- Preparing For Surgery  Before surgery, you can play an important role. Because skin is not sterile, your skin needs to be as free of germs as possible. You can reduce the number of germs on your skin by washing with CHG (chlorahexidine gluconate) Soap before surgery.  CHG is an antiseptic cleaner which kills germs and bonds with the skin to continue killing germs even after washing.    Oral Hygiene is also important to reduce your risk of infection.  Remember - BRUSH YOUR TEETH THE MORNING OF SURGERY WITH YOUR REGULAR TOOTHPASTE  Please do not use if you have an allergy to CHG or antibacterial soaps. If your skin becomes reddened/irritated stop using the CHG.  Do not shave (including legs and underarms) for at least 48 hours prior to first CHG shower. It is OK to shave your face.  Please follow these instructions carefully.   1. Shower the NIGHT BEFORE SURGERY and the MORNING OF SURGERY with CHG.   2. If you chose to wash your  hair, wash your hair first as usual with your normal shampoo.  3. After you shampoo, wash your face and private area with the soap you use at home, then rinse your hair and body thoroughly to remove the shampoo and soap.  4. Use CHG as you would any other liquid soap. You can apply CHG directly to the skin and wash gently with a scrungie or a clean washcloth.   5. Apply the CHG Soap to your body ONLY FROM THE NECK DOWN.  Do not use on open wounds or open sores. Avoid contact with your eyes, ears, mouth and genitals (private parts).   6. Wash thoroughly, paying special attention to the area where your surgery will be performed.  7. Thoroughly rinse your body with warm water from the neck down.  8. DO NOT shower/wash with your normal soap after using and rinsing off the CHG Soap.  9. Pat yourself dry with a CLEAN TOWEL.  10. Wear CLEAN PAJAMAS to bed the night before surgery, wear comfortable clothes the morning of surgery  11. Place CLEAN SHEETS on your bed the night of your first shower and DO NOT SLEEP WITH PETS.  Day of Surgery: Shower as instructed above. Do not apply any deodorants/lotions, powders or colognes.  Please wear clean clothes to  the hospital/surgery center.   Remember to brush your teeth WITH YOUR REGULAR TOOTHPASTE.  Do not wear jewelry, make-up or nail polish.  Do not shave 48 hours prior to surgery.  Men may shave face and neck.  Do not bring valuables to the hospital.  [facility] is not responsible for any belongings or valuables.  Contacts, dentures or bridgework may not be worn into surgery.  Leave your suitcase in the car.  After surgery it may be brought to your room.  For patients admitted to the hospital, discharge time will be determined by your treatment team.  Patients discharged the day of surgery will not be allowed to drive home.   Please read over the fact sheets that you were given.

## 2020-06-24 ENCOUNTER — Ambulatory Visit: Payer: BC Managed Care – PPO | Admitting: Family Medicine

## 2020-06-24 ENCOUNTER — Ambulatory Visit: Payer: Self-pay

## 2020-06-24 ENCOUNTER — Other Ambulatory Visit: Payer: Self-pay

## 2020-06-24 ENCOUNTER — Encounter: Payer: Self-pay | Admitting: Family Medicine

## 2020-06-24 VITALS — BP 118/80 | Ht 67.0 in | Wt 375.0 lb

## 2020-06-24 DIAGNOSIS — M7660 Achilles tendinitis, unspecified leg: Secondary | ICD-10-CM | POA: Diagnosis not present

## 2020-06-24 DIAGNOSIS — M19071 Primary osteoarthritis, right ankle and foot: Secondary | ICD-10-CM | POA: Diagnosis not present

## 2020-06-24 NOTE — Progress Notes (Signed)
Margaret Golden - 30 y.o. female MRN [id number]  Date of birth: 07-04-1990  SUBJECTIVE:  Including CC & ROS.  No chief complaint on file.   Margaret Golden is a 30 y.o. female that is presenting with right heel pain.  Pain has been ongoing for 2 weeks.  Denies any new or different activities.  No new or different medications or antibiotics.  Pain is worse with ambulating and weightbearing.   Review of Systems See HPI   HISTORY: Past Medical, Surgical, Social, and Family History Reviewed & Updated per EMR.   Pertinent Historical Findings include:  Past Medical History:  Diagnosis Date  . Acid reflux   . ADD (attention deficit disorder)   . Anemia   . Anxiety   . Arthritis    left ankle  . Back pain   . Chest pain   . Complication of anesthesia   . Depression   . Dyspnea    with exertion  . Exercise-induced asthma    as a child  . Fatty liver   . History of blood transfusion   . History of chicken pox   . History of kidney stones    passed   . Hypothyroidism   . Joint pain   . Lower extremity edema   . Major depressive disorder   . Migraines    chronic  . Palpitations   . Pneumonia    x 2  . PONV (postoperative nausea and vomiting)    nausea  . RLS (restless legs syndrome)   . Sleep apnea 2021  . Thyroid disease    hypothyroid    Past Surgical History:  Procedure Laterality Date  . BRONCHIAL NEEDLE ASPIRATION BIOPSY  03/31/2020   Procedure: BRONCHIAL NEEDLE ASPIRATION BIOPSIES;  Surgeon: Josephine Igo, DO;  Location: [facility];  Service: Pulmonary;;  . BRONCHIAL WASHINGS  03/31/2020   Procedure: BRONCHIAL WASHINGS;  Surgeon: Josephine Igo, DO;  Location: [facility];  Service: Pulmonary;;  . DILATATION & CURETTAGE/HYSTEROSCOPY WITH MYOSURE N/A 03/22/2020   Procedure: DILATATION & CURETTAGE/HYSTEROSCOPY WITH MYOSURE;  Surgeon: Romualdo Bolk, MD;  Location: [facility] OR;  Service: Gynecology;  Laterality: N/A;  . INTRAUTERINE DEVICE (IUD) INSERTION  N/A 03/22/2020   Procedure: INTRAUTERINE DEVICE (IUD) INSERTION;  Surgeon: Romualdo Bolk, MD;  Location: [facility] OR;  Service: Gynecology;  Laterality: N/A;  Mirena IUD insertion  . KNEE ARTHROSCOPY Right 2007  . TONSILLECTOMY  2010  . TONSILLECTOMY    . VIDEO BRONCHOSCOPY WITH ENDOBRONCHIAL ULTRASOUND N/A 03/31/2020   Procedure: VIDEO BRONCHOSCOPY WITH ENDOBRONCHIAL ULTRASOUND;  Surgeon: Josephine Igo, DO;  Location: [facility];  Service: Pulmonary;  Laterality: N/A;  . WISDOM TOOTH EXTRACTION    . WISDOM TOOTH EXTRACTION Bilateral    upper and lower    Family History  Problem Relation Age of Onset  . Depression Mother   . Obesity Mother   . Depression Father   . Sleep apnea Father   . Obesity Father   . Cancer Paternal Aunt        colon  . Cancer Paternal Uncle        brain cancer  . Diabetes Maternal Grandfather   . Arthritis Maternal Grandfather   . Arthritis Maternal Grandmother   . Breast cancer Maternal Grandmother   . Arthritis Paternal Grandmother   . Arthritis Paternal Grandfather   . Migraines Neg Hx     Social History   Socioeconomic History  . Marital status: Single  Spouse name: Not on file  . Number of children: 0  . Years of education: 46  . Highest education level: Not on file  Occupational History  . Occupation: Conservation officer, historic buildings  Tobacco Use  . Smoking status: Never Smoker  . Smokeless tobacco: Never Used  Vaping Use  . Vaping Use: Never used  Substance and Sexual Activity  . Alcohol use: Not Currently    Alcohol/week: 0.0 standard drinks  . Drug use: No  . Sexual activity: Never    Comment: never sexually active  Other Topics Concern  . Not on file  Social History Narrative   Unemployed   Lives alone --update 11/11/19   Seeking work   Only child   Dog 3   Lives in a one story house with father, has a cat-12/26/16-sjb      Caffeine: 2-4 cups/day   Social Determinants of Corporate investment banker Strain: Not on file   Food Insecurity: Not on file  Transportation Needs: Not on file  Physical Activity: Not on file  Stress: Not on file  Social Connections: Not on file  Intimate Partner Violence: Not on file     PHYSICAL EXAM:  VS: BP 118/80 (BP Location: Left Arm, Patient Position: Sitting, Cuff Size: Large)   Ht 5\' 7"  (1.702 m)   Wt (!) 375 lb (170.1 kg)   LMP 06/04/2020   BMI 58.73 kg/m  Physical Exam Gen: NAD, alert, cooperative with exam, well-appearing MSK:  Right Achilles: Normal range of motion. Normal strength resistance. No swelling or ecchymosis. Plantarflexion with 08/04/2020 test. Neurovascular intact  Limited ultrasound: Right Achilles/ankle:  Normal-appearing Achilles. No bursitis appreciated or hyperemia. No changes at the calcaneus. There does appear to be chronic lesion of the talar dome. Mild effusion emanating from the subtalar joint.  Summary: Findings show possible irritation of the subtalar joint.  Ultrasound and interpretation by Janee Morn, MD    ASSESSMENT & PLAN:   Arthritis of right subtalar joint Symptoms seem more associated with the subtalar joint as opposed with Achilles.  Has changes of the talar dome as well. -Counseled on home exercise therapy and supportive care. -Avoiding anti-inflammatories with her upcoming biopsy. -Provided heel lift. -Could consider injection or physical therapy.

## 2020-06-24 NOTE — Assessment & Plan Note (Signed)
Symptoms seem more associated with the subtalar joint as opposed with Achilles.  Has changes of the talar dome as well. -Counseled on home exercise therapy and supportive care. -Avoiding anti-inflammatories with her upcoming biopsy. -Provided heel lift. -Could consider injection or physical therapy.

## 2020-06-24 NOTE — Patient Instructions (Signed)
Good to see you Please try heel lifts  Please try ice as needed  Please try the exercises   Please send me a message in MyChart with any questions or updates.  Please see me back in 4 weeks.   --Dr. Jordan Likes

## 2020-06-27 ENCOUNTER — Observation Stay (HOSPITAL_COMMUNITY)
Admission: AD | Admit: 2020-06-27 | Discharge: 2020-06-28 | Disposition: A | Discharge status: Home / Self Care | Payer: BC Managed Care – PPO | Attending: Thoracic Surgery (Cardiothoracic Vascular Surgery) | Admitting: Thoracic Surgery (Cardiothoracic Vascular Surgery)

## 2020-06-27 ENCOUNTER — Ambulatory Visit (HOSPITAL_COMMUNITY): Payer: BC Managed Care – PPO

## 2020-06-27 ENCOUNTER — Encounter (HOSPITAL_COMMUNITY)
Admission: AD | Disposition: A | Discharge status: Home / Self Care | Payer: Self-pay | Source: Home / Self Care | Attending: Thoracic Surgery (Cardiothoracic Vascular Surgery)

## 2020-06-27 ENCOUNTER — Encounter (HOSPITAL_COMMUNITY): Payer: Self-pay | Admitting: Thoracic Surgery (Cardiothoracic Vascular Surgery)

## 2020-06-27 ENCOUNTER — Ambulatory Visit (HOSPITAL_COMMUNITY): Payer: BC Managed Care – PPO | Admitting: Certified Registered Nurse Anesthetist

## 2020-06-27 ENCOUNTER — Other Ambulatory Visit: Payer: Self-pay

## 2020-06-27 ENCOUNTER — Inpatient Hospital Stay (HOSPITAL_COMMUNITY): Payer: BC Managed Care – PPO

## 2020-06-27 DIAGNOSIS — Z01811 Encounter for preprocedural respiratory examination: Secondary | ICD-10-CM

## 2020-06-27 DIAGNOSIS — F909 Attention-deficit hyperactivity disorder, unspecified type: Secondary | ICD-10-CM | POA: Diagnosis not present

## 2020-06-27 DIAGNOSIS — D649 Anemia, unspecified: Secondary | ICD-10-CM | POA: Insufficient documentation

## 2020-06-27 DIAGNOSIS — F419 Anxiety disorder, unspecified: Secondary | ICD-10-CM | POA: Diagnosis not present

## 2020-06-27 DIAGNOSIS — Z791 Long term (current) use of non-steroidal anti-inflammatories (NSAID): Secondary | ICD-10-CM | POA: Insufficient documentation

## 2020-06-27 DIAGNOSIS — Z6841 Body Mass Index (BMI) 40.0 and over, adult: Secondary | ICD-10-CM | POA: Diagnosis not present

## 2020-06-27 DIAGNOSIS — R590 Localized enlarged lymph nodes: Principal | ICD-10-CM | POA: Insufficient documentation

## 2020-06-27 DIAGNOSIS — M19071 Primary osteoarthritis, right ankle and foot: Secondary | ICD-10-CM | POA: Insufficient documentation

## 2020-06-27 DIAGNOSIS — J939 Pneumothorax, unspecified: Secondary | ICD-10-CM

## 2020-06-27 DIAGNOSIS — I898 Other specified noninfective disorders of lymphatic vessels and lymph nodes: Secondary | ICD-10-CM | POA: Diagnosis not present

## 2020-06-27 DIAGNOSIS — J9811 Atelectasis: Secondary | ICD-10-CM | POA: Diagnosis not present

## 2020-06-27 DIAGNOSIS — F329 Major depressive disorder, single episode, unspecified: Secondary | ICD-10-CM | POA: Diagnosis not present

## 2020-06-27 DIAGNOSIS — G2581 Restless legs syndrome: Secondary | ICD-10-CM | POA: Diagnosis not present

## 2020-06-27 DIAGNOSIS — Z79899 Other long term (current) drug therapy: Secondary | ICD-10-CM | POA: Insufficient documentation

## 2020-06-27 DIAGNOSIS — R002 Palpitations: Secondary | ICD-10-CM | POA: Insufficient documentation

## 2020-06-27 DIAGNOSIS — E782 Mixed hyperlipidemia: Secondary | ICD-10-CM | POA: Diagnosis not present

## 2020-06-27 DIAGNOSIS — Z9889 Other specified postprocedural states: Secondary | ICD-10-CM

## 2020-06-27 DIAGNOSIS — Z01818 Encounter for other preprocedural examination: Secondary | ICD-10-CM | POA: Diagnosis not present

## 2020-06-27 DIAGNOSIS — Z7951 Long term (current) use of inhaled steroids: Secondary | ICD-10-CM | POA: Insufficient documentation

## 2020-06-27 DIAGNOSIS — I10 Essential (primary) hypertension: Secondary | ICD-10-CM | POA: Diagnosis not present

## 2020-06-27 DIAGNOSIS — R0602 Shortness of breath: Secondary | ICD-10-CM | POA: Diagnosis not present

## 2020-06-27 DIAGNOSIS — E039 Hypothyroidism, unspecified: Secondary | ICD-10-CM | POA: Diagnosis not present

## 2020-06-27 DIAGNOSIS — E662 Morbid (severe) obesity with alveolar hypoventilation: Secondary | ICD-10-CM | POA: Insufficient documentation

## 2020-06-27 HISTORY — PX: LYMPH NODE BIOPSY: SHX201

## 2020-06-27 HISTORY — PX: INTERCOSTAL NERVE BLOCK: SHX5021

## 2020-06-27 LAB — POCT PREGNANCY, URINE: Preg Test, Ur: NEGATIVE

## 2020-06-27 LAB — GLUCOSE, CAPILLARY
Glucose-Capillary: 130 mg/dL — ABNORMAL HIGH (ref 70–99)
Glucose-Capillary: 139 mg/dL — ABNORMAL HIGH (ref 70–99)
Glucose-Capillary: 141 mg/dL — ABNORMAL HIGH (ref 70–99)

## 2020-06-27 SURGERY — THORACOSCOPY, ROBOT-ASSISTED
Anesthesia: General | Site: Chest | Laterality: Right

## 2020-06-27 MED ORDER — SUGAMMADEX SODIUM 200 MG/2ML IV SOLN
INTRAVENOUS | Status: DC | PRN
  Administered 2020-06-27: 300 mg via INTRAVENOUS

## 2020-06-27 MED ORDER — DEXAMETHASONE SODIUM PHOSPHATE 10 MG/ML IJ SOLN
INTRAMUSCULAR | Status: DC | PRN
  Administered 2020-06-27: 10 mg via INTRAVENOUS

## 2020-06-27 MED ORDER — DEXTROSE 5 % IV SOLN
INTRAVENOUS | Status: DC | PRN
  Administered 2020-06-27: 3 g via INTRAVENOUS

## 2020-06-27 MED ORDER — CEFAZOLIN SODIUM-DEXTROSE 2-4 GM/100ML-% IV SOLN
INTRAVENOUS | Status: AC
  Filled 2020-06-27: qty 100

## 2020-06-27 MED ORDER — TRAMADOL HCL 50 MG PO TABS
50.0000 mg | ORAL_TABLET | Freq: Four times a day (QID) | ORAL | Status: DC | PRN
  Administered 2020-06-27 – 2020-06-28 (×2): 100 mg via ORAL
  Filled 2020-06-27 (×3): qty 2

## 2020-06-27 MED ORDER — SCOPOLAMINE 1 MG/3DAYS TD PT72
MEDICATED_PATCH | TRANSDERMAL | Status: AC
  Administered 2020-06-27: 1.5 mg via TRANSDERMAL
  Filled 2020-06-27: qty 1

## 2020-06-27 MED ORDER — LEVOTHYROXINE SODIUM 112 MCG PO TABS: 112.0000 ug | ORAL_TABLET | Freq: Every day | ORAL | Status: DC

## 2020-06-27 MED ORDER — BUPIVACAINE HCL (PF) 0.5 % IJ SOLN
INTRAMUSCULAR | Status: AC
  Filled 2020-06-27: qty 30

## 2020-06-27 MED ORDER — MEPERIDINE HCL 25 MG/ML IJ SOLN: 6.2500 mg | INTRAMUSCULAR | Status: DC | PRN

## 2020-06-27 MED ORDER — ONDANSETRON HCL 4 MG/2ML IJ SOLN
INTRAMUSCULAR | Status: DC | PRN
  Administered 2020-06-27: 4 mg via INTRAVENOUS

## 2020-06-27 MED ORDER — SUGAMMADEX SODIUM 500 MG/5ML IV SOLN
INTRAVENOUS | Status: AC
  Filled 2020-06-27: qty 5

## 2020-06-27 MED ORDER — LORAZEPAM 0.5 MG PO TABS: 0.5000 mg | ORAL_TABLET | Freq: Three times a day (TID) | ORAL | Status: DC | PRN

## 2020-06-27 MED ORDER — MIDAZOLAM HCL 5 MG/5ML IJ SOLN
INTRAMUSCULAR | Status: DC | PRN
  Administered 2020-06-27: 2 mg via INTRAVENOUS

## 2020-06-27 MED ORDER — 0.9 % SODIUM CHLORIDE (POUR BTL) OPTIME
TOPICAL | Status: DC | PRN
  Administered 2020-06-27: 2000 mL

## 2020-06-27 MED ORDER — DEXMEDETOMIDINE (PRECEDEX) IN NS 20 MCG/5ML (4 MCG/ML) IV SYRINGE
PREFILLED_SYRINGE | INTRAVENOUS | Status: DC | PRN
  Administered 2020-06-27: 4 ug via INTRAVENOUS
  Administered 2020-06-27 (×2): 8 ug via INTRAVENOUS

## 2020-06-27 MED ORDER — ROCURONIUM BROMIDE 10 MG/ML (PF) SYRINGE
PREFILLED_SYRINGE | INTRAVENOUS | Status: DC | PRN
  Administered 2020-06-27 (×2): 30 mg via INTRAVENOUS
  Administered 2020-06-27: 70 mg via INTRAVENOUS

## 2020-06-27 MED ORDER — PROPOFOL 10 MG/ML IV BOLUS
INTRAVENOUS | Status: AC
  Filled 2020-06-27: qty 20

## 2020-06-27 MED ORDER — ONDANSETRON HCL 4 MG/2ML IJ SOLN
4.0000 mg | Freq: Once | INTRAMUSCULAR | Status: AC
  Administered 2020-06-27: 4 mg via INTRAVENOUS
  Filled 2020-06-27: qty 2

## 2020-06-27 MED ORDER — VANCOMYCIN HCL 1000 MG/200ML IV SOLN
1000.0000 mg | Freq: Two times a day (BID) | INTRAVENOUS | Status: AC
  Administered 2020-06-27: 1000 mg via INTRAVENOUS
  Filled 2020-06-27: qty 200

## 2020-06-27 MED ORDER — MIDAZOLAM HCL 2 MG/2ML IJ SOLN
INTRAMUSCULAR | Status: AC
  Filled 2020-06-27: qty 2

## 2020-06-27 MED ORDER — ACETAMINOPHEN 500 MG PO TABS: 1000.0000 mg | ORAL_TABLET | Freq: Once | ORAL | Status: AC

## 2020-06-27 MED ORDER — INSULIN ASPART 100 UNIT/ML ~~LOC~~ SOLN
0.0000 [IU] | SUBCUTANEOUS | Status: DC
  Administered 2020-06-27 (×3): 2 [IU] via SUBCUTANEOUS

## 2020-06-27 MED ORDER — BUPIVACAINE LIPOSOME 1.3 % IJ SUSP
INTRAMUSCULAR | Status: AC
  Filled 2020-06-27: qty 20

## 2020-06-27 MED ORDER — CHLORHEXIDINE GLUCONATE 0.12 % MT SOLN: 15.0000 mL | Freq: Once | OROMUCOSAL | Status: AC

## 2020-06-27 MED ORDER — PROPRANOLOL HCL 20 MG PO TABS
20.0000 mg | ORAL_TABLET | Freq: Once | ORAL | Status: DC
  Filled 2020-06-27: qty 1

## 2020-06-27 MED ORDER — ACETAMINOPHEN 160 MG/5ML PO SOLN: 1000.0000 mg | Freq: Four times a day (QID) | ORAL | Status: DC

## 2020-06-27 MED ORDER — LACTATED RINGERS IV SOLN: INTRAVENOUS | Status: DC | PRN

## 2020-06-27 MED ORDER — SCOPOLAMINE 1 MG/3DAYS TD PT72: 1.0000 | MEDICATED_PATCH | TRANSDERMAL | Status: DC

## 2020-06-27 MED ORDER — ORAL CARE MOUTH RINSE: 15.0000 mL | Freq: Once | OROMUCOSAL | Status: AC

## 2020-06-27 MED ORDER — KETOROLAC TROMETHAMINE 15 MG/ML IJ SOLN
INTRAMUSCULAR | Status: AC
  Filled 2020-06-27: qty 1

## 2020-06-27 MED ORDER — LACTATED RINGERS IV SOLN: INTRAVENOUS | Status: DC

## 2020-06-27 MED ORDER — CHLORHEXIDINE GLUCONATE 0.12 % MT SOLN
OROMUCOSAL | Status: AC
  Administered 2020-06-27: 15 mL via OROMUCOSAL
  Filled 2020-06-27: qty 15

## 2020-06-27 MED ORDER — HYDROMORPHONE HCL 1 MG/ML IJ SOLN
INTRAMUSCULAR | Status: AC
  Filled 2020-06-27: qty 2

## 2020-06-27 MED ORDER — KETOROLAC TROMETHAMINE 15 MG/ML IJ SOLN
15.0000 mg | Freq: Four times a day (QID) | INTRAMUSCULAR | Status: DC
  Administered 2020-06-27 – 2020-06-28 (×5): 15 mg via INTRAVENOUS
  Filled 2020-06-27 (×4): qty 1

## 2020-06-27 MED ORDER — ONDANSETRON HCL 4 MG/2ML IJ SOLN
4.0000 mg | Freq: Four times a day (QID) | INTRAMUSCULAR | Status: DC | PRN
  Administered 2020-06-27: 4 mg via INTRAVENOUS
  Filled 2020-06-27: qty 2

## 2020-06-27 MED ORDER — ACETAMINOPHEN 500 MG PO TABS
1000.0000 mg | ORAL_TABLET | Freq: Four times a day (QID) | ORAL | Status: DC
  Administered 2020-06-27 – 2020-06-28 (×3): 1000 mg via ORAL
  Filled 2020-06-27 (×3): qty 2

## 2020-06-27 MED ORDER — ONDANSETRON HCL 4 MG/2ML IJ SOLN
INTRAMUSCULAR | Status: AC
  Filled 2020-06-27: qty 2

## 2020-06-27 MED ORDER — BUPIVACAINE LIPOSOME 1.3 % IJ SUSP
INTRAMUSCULAR | Status: DC | PRN
  Administered 2020-06-27: 100 mL

## 2020-06-27 MED ORDER — FENTANYL CITRATE (PF) 100 MCG/2ML IJ SOLN
12.5000 ug | Freq: Four times a day (QID) | INTRAMUSCULAR | Status: DC | PRN
  Administered 2020-06-27: 12.5 ug via INTRAVENOUS
  Filled 2020-06-27: qty 2

## 2020-06-27 MED ORDER — SERTRALINE HCL 100 MG PO TABS
200.0000 mg | ORAL_TABLET | Freq: Every day | ORAL | Status: DC
  Administered 2020-06-28: 200 mg via ORAL
  Filled 2020-06-27: qty 2

## 2020-06-27 MED ORDER — LIDOCAINE 2% (20 MG/ML) 5 ML SYRINGE
INTRAMUSCULAR | Status: DC | PRN
  Administered 2020-06-27: 80 mg via INTRAVENOUS

## 2020-06-27 MED ORDER — PROPOFOL 10 MG/ML IV BOLUS
INTRAVENOUS | Status: DC | PRN
  Administered 2020-06-27: 40 mg via INTRAVENOUS
  Administered 2020-06-27: 150 mg via INTRAVENOUS
  Administered 2020-06-27: 50 mg via INTRAVENOUS

## 2020-06-27 MED ORDER — LAMOTRIGINE 25 MG PO TABS
200.0000 mg | ORAL_TABLET | Freq: Every day | ORAL | Status: DC
  Administered 2020-06-28: 200 mg via ORAL
  Filled 2020-06-27: qty 8

## 2020-06-27 MED ORDER — FENTANYL CITRATE (PF) 250 MCG/5ML IJ SOLN
INTRAMUSCULAR | Status: AC
  Filled 2020-06-27: qty 5

## 2020-06-27 MED ORDER — LEVOTHYROXINE SODIUM 100 MCG PO TABS: 100.0000 ug | ORAL_TABLET | Freq: Every day | ORAL | Status: DC

## 2020-06-27 MED ORDER — FENTANYL CITRATE (PF) 100 MCG/2ML IJ SOLN
INTRAMUSCULAR | Status: DC | PRN
  Administered 2020-06-27 (×5): 50 ug via INTRAVENOUS

## 2020-06-27 MED ORDER — DEXMEDETOMIDINE (PRECEDEX) IN NS 20 MCG/5ML (4 MCG/ML) IV SYRINGE
PREFILLED_SYRINGE | INTRAVENOUS | Status: AC
  Filled 2020-06-27: qty 5

## 2020-06-27 MED ORDER — LEVOTHYROXINE SODIUM 112 MCG PO TABS
212.0000 ug | ORAL_TABLET | Freq: Every day | ORAL | Status: DC
  Administered 2020-06-28: 212 ug via ORAL
  Filled 2020-06-27: qty 1

## 2020-06-27 MED ORDER — ROCURONIUM BROMIDE 10 MG/ML (PF) SYRINGE
PREFILLED_SYRINGE | INTRAVENOUS | Status: AC
  Filled 2020-06-27: qty 10

## 2020-06-27 MED ORDER — HYDROMORPHONE HCL 1 MG/ML IJ SOLN
0.2500 mg | INTRAMUSCULAR | Status: DC | PRN
  Administered 2020-06-27: 1 mg via INTRAVENOUS
  Administered 2020-06-27: 0.5 mg via INTRAVENOUS

## 2020-06-27 MED ORDER — TRAZODONE HCL 50 MG PO TABS: 25.0000 mg | ORAL_TABLET | Freq: Every evening | ORAL | Status: DC | PRN

## 2020-06-27 MED ORDER — LIDOCAINE 2% (20 MG/ML) 5 ML SYRINGE
INTRAMUSCULAR | Status: AC
  Filled 2020-06-27: qty 5

## 2020-06-27 MED ORDER — ENOXAPARIN SODIUM 40 MG/0.4ML ~~LOC~~ SOLN
40.0000 mg | Freq: Every day | SUBCUTANEOUS | Status: DC
  Administered 2020-06-28: 40 mg via SUBCUTANEOUS
  Filled 2020-06-27: qty 0.4

## 2020-06-27 MED ORDER — ACETAMINOPHEN 500 MG PO TABS
ORAL_TABLET | ORAL | Status: AC
  Administered 2020-06-27: 1000 mg via ORAL
  Filled 2020-06-27: qty 2

## 2020-06-27 MED ORDER — OXYCODONE HCL 5 MG/5ML PO SOLN: 5.0000 mg | Freq: Once | ORAL | Status: AC | PRN

## 2020-06-27 MED ORDER — CEFAZOLIN SODIUM-DEXTROSE 1-4 GM/50ML-% IV SOLN
INTRAVENOUS | Status: AC
  Filled 2020-06-27: qty 50

## 2020-06-27 MED ORDER — ALBUTEROL SULFATE HFA 108 (90 BASE) MCG/ACT IN AERS
2.0000 | INHALATION_SPRAY | Freq: Four times a day (QID) | RESPIRATORY_TRACT | Status: DC | PRN
  Filled 2020-06-27: qty 6.7

## 2020-06-27 MED ORDER — OXYCODONE HCL 5 MG PO TABS
5.0000 mg | ORAL_TABLET | Freq: Once | ORAL | Status: AC | PRN
  Administered 2020-06-27: 5 mg via ORAL

## 2020-06-27 MED ORDER — BISACODYL 5 MG PO TBEC
10.0000 mg | DELAYED_RELEASE_TABLET | Freq: Every day | ORAL | Status: DC
  Filled 2020-06-27: qty 2

## 2020-06-27 MED ORDER — SENNOSIDES-DOCUSATE SODIUM 8.6-50 MG PO TABS: 1.0000 | ORAL_TABLET | Freq: Every day | ORAL | Status: DC

## 2020-06-27 MED ORDER — PROMETHAZINE HCL 25 MG/ML IJ SOLN: 6.2500 mg | INTRAMUSCULAR | Status: DC | PRN

## 2020-06-27 MED ORDER — OXYCODONE HCL 5 MG PO TABS
ORAL_TABLET | ORAL | Status: AC
  Filled 2020-06-27: qty 1

## 2020-06-27 MED ORDER — DEXAMETHASONE SODIUM PHOSPHATE 10 MG/ML IJ SOLN
INTRAMUSCULAR | Status: AC
  Filled 2020-06-27: qty 1

## 2020-06-27 MED ORDER — MIDAZOLAM HCL 2 MG/2ML IJ SOLN
0.5000 mg | Freq: Once | INTRAMUSCULAR | Status: AC | PRN
  Administered 2020-06-27: 1 mg via INTRAVENOUS

## 2020-06-27 SURGICAL SUPPLY — 109 items
BLADE CLIPPER SURG (BLADE) IMPLANT
BNDG COHESIVE 6X5 TAN STRL LF (GAUZE/BANDAGES/DRESSINGS) ×3 IMPLANT
CANISTER SUCT 3000ML PPV (MISCELLANEOUS) ×6 IMPLANT
CANNULA REDUC XI 12-8 STAPL (CANNULA) ×2
CANNULA REDUCER 12-8 DVNC XI (CANNULA) ×4 IMPLANT
CATH THORACIC 28FR (CATHETERS) IMPLANT
CATH THORACIC 28FR RT ANG (CATHETERS) IMPLANT
CATH THORACIC 36FR (CATHETERS) IMPLANT
CATH THORACIC 36FR RT ANG (CATHETERS) IMPLANT
CATH TROCAR 20FR (CATHETERS) IMPLANT
CHLORAPREP W/TINT 26 (MISCELLANEOUS) ×3 IMPLANT
CLIP VESOCCLUDE MED 6/CT (CLIP) IMPLANT
CNTNR URN SCR LID CUP LEK RST (MISCELLANEOUS) ×12 IMPLANT
CONN ST 1/4X3/8  BEN (MISCELLANEOUS)
CONN ST 1/4X3/8 BEN (MISCELLANEOUS) IMPLANT
CONT SPEC 4OZ STRL OR WHT (MISCELLANEOUS) ×6
COVER SURGICAL LIGHT HANDLE (MISCELLANEOUS) ×3 IMPLANT
DEFOGGER SCOPE WARMER CLEARIFY (MISCELLANEOUS) ×3 IMPLANT
DERMABOND ADVANCED (GAUZE/BANDAGES/DRESSINGS) ×1
DERMABOND ADVANCED .7 DNX12 (GAUZE/BANDAGES/DRESSINGS) ×2 IMPLANT
DRAIN CHANNEL 28F RND 3/8 FF (WOUND CARE) IMPLANT
DRAIN CHANNEL 32F RND 10.7 FF (WOUND CARE) IMPLANT
DRAPE ARM DVNC X/XI (DISPOSABLE) ×8 IMPLANT
DRAPE COLUMN DVNC XI (DISPOSABLE) ×2 IMPLANT
DRAPE CV SPLIT W-CLR ANES SCRN (DRAPES) ×3 IMPLANT
DRAPE DA VINCI XI ARM (DISPOSABLE) ×4
DRAPE DA VINCI XI COLUMN (DISPOSABLE) ×1
DRAPE LAPAROSCOPIC ABDOMINAL (DRAPES) ×3 IMPLANT
DRAPE ORTHO SPLIT 77X108 STRL (DRAPES) ×1
DRAPE SLUSH/WARMER DISC (DRAPES) IMPLANT
DRAPE SURG ORHT 6 SPLT 77X108 (DRAPES) ×2 IMPLANT
DRAPE WARM FLUID 44X44 (DRAPES) IMPLANT
ELECT BLADE 6.5 EXT (BLADE) IMPLANT
ELECT REM PT RETURN 9FT ADLT (ELECTROSURGICAL) ×3
ELECTRODE REM PT RTRN 9FT ADLT (ELECTROSURGICAL) ×2 IMPLANT
GAUZE 4X4 16PLY RFD (DISPOSABLE) ×3 IMPLANT
GAUZE KITTNER 4X5 RF (MISCELLANEOUS) ×3 IMPLANT
GAUZE SPONGE 4X4 12PLY STRL (GAUZE/BANDAGES/DRESSINGS) ×3 IMPLANT
GAUZE SPONGE 4X4 12PLY STRL LF (GAUZE/BANDAGES/DRESSINGS) ×3 IMPLANT
GLOVE BIO SURGEON STRL SZ7 (GLOVE) ×3 IMPLANT
GLOVE BIO SURGEON STRL SZ7.5 (GLOVE) ×6 IMPLANT
GOWN STRL REUS W/ TWL LRG LVL3 (GOWN DISPOSABLE) ×4 IMPLANT
GOWN STRL REUS W/ TWL XL LVL3 (GOWN DISPOSABLE) ×6 IMPLANT
GOWN STRL REUS W/TWL 2XL LVL3 (GOWN DISPOSABLE) ×3 IMPLANT
GOWN STRL REUS W/TWL LRG LVL3 (GOWN DISPOSABLE) ×2
GOWN STRL REUS W/TWL XL LVL3 (GOWN DISPOSABLE) ×3
HEMOSTAT SURGICEL 2X14 (HEMOSTASIS) ×9 IMPLANT
IRRIGATION STRYKERFLOW (MISCELLANEOUS) ×2 IMPLANT
IRRIGATOR STRYKERFLOW (MISCELLANEOUS) ×3
KIT BASIN OR (CUSTOM PROCEDURE TRAY) ×3 IMPLANT
KIT SUCTION CATH 14FR (SUCTIONS) IMPLANT
KIT TURNOVER KIT B (KITS) ×3 IMPLANT
LOOP VESSEL SUPERMAXI WHITE (MISCELLANEOUS) IMPLANT
NEEDLE 22X1 1/2 (OR ONLY) (NEEDLE) ×3 IMPLANT
NEEDLE HYPO 22GX1.5 SAFETY (NEEDLE) ×3 IMPLANT
NEEDLE HYPO 25GX1X1/2 BEV (NEEDLE) ×3 IMPLANT
NS IRRIG 1000ML POUR BTL (IV SOLUTION) ×9 IMPLANT
OBTURATOR OPTICAL STANDARD 8MM (TROCAR)
OBTURATOR OPTICAL STND 8 DVNC (TROCAR)
OBTURATOR OPTICALSTD 8 DVNC (TROCAR) IMPLANT
PACK CHEST (CUSTOM PROCEDURE TRAY) ×3 IMPLANT
PACK GENERAL/GYN (CUSTOM PROCEDURE TRAY) IMPLANT
PAD ARMBOARD 7.5X6 YLW CONV (MISCELLANEOUS) ×15 IMPLANT
PORT ACCESS TROCAR AIRSEAL 12 (TROCAR) ×2 IMPLANT
PORT ACCESS TROCAR AIRSEAL 5M (TROCAR) ×1
SEAL CANN UNIV 5-8 DVNC XI (MISCELLANEOUS) ×8 IMPLANT
SEAL XI 5MM-8MM UNIVERSAL (MISCELLANEOUS) ×4
SEALANT PROGEL (MISCELLANEOUS) IMPLANT
SEALANT SURG COSEAL 4ML (VASCULAR PRODUCTS) IMPLANT
SEALANT SURG COSEAL 8ML (VASCULAR PRODUCTS) IMPLANT
SET TRI-LUMEN FLTR TB AIRSEAL (TUBING) ×3 IMPLANT
SOLUTION ELECTROLUBE (MISCELLANEOUS) IMPLANT
SPONGE INTESTINAL PEANUT (DISPOSABLE) IMPLANT
STAPLER CANNULA SEAL DVNC XI (STAPLE) ×4 IMPLANT
STAPLER CANNULA SEAL XI (STAPLE) ×2
STOPCOCK 4 WAY LG BORE MALE ST (IV SETS) ×3 IMPLANT
SUT MNCRL AB 3-0 PS2 18 (SUTURE) IMPLANT
SUT MON AB 2-0 CT1 36 (SUTURE) IMPLANT
SUT PDS AB 1 CTX 36 (SUTURE) IMPLANT
SUT PROLENE 4 0 RB 1 (SUTURE)
SUT PROLENE 4-0 RB1 .5 CRCL 36 (SUTURE) IMPLANT
SUT SILK  1 MH (SUTURE) ×1
SUT SILK 1 MH (SUTURE) ×2 IMPLANT
SUT SILK 1 TIES 10X30 (SUTURE) ×3 IMPLANT
SUT SILK 2 0 SH (SUTURE) IMPLANT
SUT SILK 2 0SH CR/8 30 (SUTURE) IMPLANT
SUT VIC AB 1 CTX 36 (SUTURE)
SUT VIC AB 1 CTX36XBRD ANBCTR (SUTURE) IMPLANT
SUT VIC AB 2-0 CT1 27 (SUTURE) ×1
SUT VIC AB 2-0 CT1 TAPERPNT 27 (SUTURE) ×2 IMPLANT
SUT VIC AB 3-0 SH 27 (SUTURE) ×3
SUT VIC AB 3-0 SH 27X BRD (SUTURE) ×6 IMPLANT
SUT VICRYL 0 TIES 12 18 (SUTURE) ×3 IMPLANT
SUT VICRYL 0 UR6 27IN ABS (SUTURE) ×6 IMPLANT
SUT VICRYL 2 TP 1 (SUTURE) IMPLANT
SYR 10ML LL (SYRINGE) ×3 IMPLANT
SYR 20ML LL LF (SYRINGE) ×3 IMPLANT
SYR 50ML LL SCALE MARK (SYRINGE) ×3 IMPLANT
SYR CONTROL 10ML LL (SYRINGE) ×3 IMPLANT
SYSTEM SAHARA CHEST DRAIN ATS (WOUND CARE) ×3 IMPLANT
TAPE CLOTH 4X10 WHT NS (GAUZE/BANDAGES/DRESSINGS) ×3 IMPLANT
TIP APPLICATOR SPRAY EXTEND 16 (VASCULAR PRODUCTS) IMPLANT
TOWEL GREEN STERILE (TOWEL DISPOSABLE) ×3 IMPLANT
TOWEL GREEN STERILE FF (TOWEL DISPOSABLE) ×3 IMPLANT
TRAY FOLEY MTR SLVR 16FR STAT (SET/KITS/TRAYS/PACK) IMPLANT
TRAY FOLEY W/BAG SLVR 14FR (SET/KITS/TRAYS/PACK) ×3 IMPLANT
TROCAR BLADELESS 15MM (ENDOMECHANICALS) IMPLANT
TUBING EXTENTION W/L.L. (IV SETS) ×3 IMPLANT
WATER STERILE IRR 1000ML POUR (IV SOLUTION) ×3 IMPLANT

## 2020-06-27 NOTE — Brief Op Note (Signed)
06/27/2020  2:53 PM  PATIENT:  [name]  30 y.o. female  PRE-OPERATIVE DIAGNOSIS:  MEDIASTINAL ADENOPATHY  POST-OPERATIVE DIAGNOSIS:  MEDIASTINAL ADENOPATHY  PROCEDURE:  Procedure(s): RIGHT XI ROBOTIC ASSISTED THORASCOPY WITH LYSIS OF ADHESIONS (Right) LYMPH NODE BIOPSY (Right) INTERCOSTAL NERVE BLOCK (Right)  SURGEON:  Surgeon(s) and Role:    * Lightfoot, Eliezer Lofts, MD - Primary  PHYSICIAN ASSISTANT:  Jari Favre, PA-C   ANESTHESIA:   general  EBL:  15 mL   BLOOD ADMINISTERED:none  DRAINS:  ONE BLAKE DRAIN    LOCAL MEDICATIONS USED:  BUPIVICAINE   SPECIMEN:  Source of Specimen:  LYMPH NODE BIOPSIES  DISPOSITION OF SPECIMEN:  PATHOLOGY  COUNTS:  YES  DICTATION: .Dragon Dictation  PLAN OF CARE: Admit to inpatient   PATIENT DISPOSITION:  PACU - hemodynamically stable.   Delay start of Pharmacological VTE agent (>24hrs) due to surgical blood loss or risk of bleeding: no

## 2020-06-27 NOTE — Interval H&P Note (Signed)
History and Physical Interval Note:  06/27/2020 11:52 AM  [name]  has presented today for surgery, with the diagnosis of MEDIASTINAL ADENOPATHY.  The various methods of treatment have been discussed with the patient and family. After consideration of risks, benefits and other options for treatment, the patient has consented to  Procedure(s): XI ROBOTIC ASSISTED THORASCOPY (Right) LYMPH NODE BIOPSY (Right) as a surgical intervention.  The patient's history has been reviewed, patient examined, no change in status, stable for surgery.  I have reviewed the patient's chart and labs.  Questions were answered to the patient's satisfaction.     Chelcee Korpi Keane Scrape

## 2020-06-27 NOTE — Transfer of Care (Signed)
Immediate Anesthesia Transfer of Care Note  Patient: Margaret Golden  Procedure(s) Performed: RIGHT XI ROBOTIC ASSISTED THORASCOPY WITH LYSIS OF ADHESIONS (Right Chest) LYMPH NODE BIOPSY (Right Chest) INTERCOSTAL NERVE BLOCK (Right Chest)  Patient Location: PACU  Anesthesia Type:General  Level of Consciousness: oriented, drowsy and patient cooperative  Airway & Oxygen Therapy: Patient Spontanous Breathing and Patient connected to face mask oxygen  Post-op Assessment: Report given to RN, Post -op Vital signs reviewed and stable and Patient moving all extremities  Post vital signs: Reviewed and stable  Last Vitals:  Vitals Value Taken Time  BP    Temp    Pulse 94 06/27/20 1456  Resp 15 06/27/20 1456  SpO2 99 % 06/27/20 1456  Vitals shown include unvalidated device data.  Last Pain:  Vitals:   06/27/20 1107  TempSrc:   PainSc: 0-No pain      Patients Stated Pain Goal: 3 (06/27/20 1107)  Complications: No complications documented.

## 2020-06-27 NOTE — Progress Notes (Signed)
Chief Complaint:   OBESITY Margaret Golden is here to discuss her progress with her obesity treatment plan along with follow-up of her obesity related diagnoses. Margaret Golden is on the Category 4 Plan and states she is following her eating plan approximately 0% of the time. Margaret Golden states she is doing 0 minutes 0 times per week.  Today's visit was #: 9 Starting weight: 367 lbs Starting date: 09/03/2019 Today's weight: 368 lbs Today's date: 06/22/2020 Total lbs lost to date: 0 Total lbs lost since last in-office visit: 0  Interim History: Margaret Golden is stressed due to recent health issues, and she has not been eating regularly. Her last visit with our office was 12/28/2019. She is ready to get restarted.  Subjective:   1. Vitamin D deficiency Margaret Golden has not been on Vit D since 01/2020. She needs lab work done.  2. Insulin resistance Margaret Golden is not on medications. She reports intermittent polyphagia.  3. At risk for diabetes mellitus Margaret Golden is at higher than average risk for developing diabetes due to obesity.   Assessment/Plan:   1. Vitamin D deficiency Low Vitamin D level contributes to fatigue and are associated with obesity, breast, and colon cancer. We will refill prescription Vitamin D for 1 month. Margaret Golden will follow-up for routine testing of Vitamin D, at least 2-3 times per year to avoid over-replacement.  - Vitamin D, Ergocalciferol, (DRISDOL) 1.25 MG (50000 UNIT) CAPS capsule; Take 1 capsule (50,000 Units total) by mouth 2 (two) times a week. (Patient taking differently: Take 50,000 Units by mouth 2 (two) times a week. Monday, Thursday)  Dispense: 10 capsule; Refill: 0  2. Insulin resistance Margaret Golden will restart the Category 3 plan, and will continue to work on weight loss, exercise, and decreasing simple carbohydrates to help decrease the risk of diabetes. Margaret Golden agreed to follow-up with [place] as directed to closely monitor her progress.  3. At risk for diabetes mellitus Margaret Golden was given  approximately 15 minutes of diabetes education and counseling today. We discussed intensive lifestyle modifications today with an emphasis on weight loss as well as increasing exercise and decreasing simple carbohydrates in her diet. We also reviewed medication options with an emphasis on risk versus benefit of those discussed.   Repetitive spaced learning was employed today to elicit superior memory formation and behavioral change.  4. Class 3 severe obesity with serious comorbidity and body mass index (BMI) of 50.0 to 59.9 in adult, unspecified obesity type (HCC) Margaret Golden is currently in the action stage of change. As such, her goal is to get back to weightloss efforts . She has agreed to the Category 3 Plan.   Category 3 was reviewed in detail. We will recheck fasting labs at her next visit.  Exercise goals: No exercise has been prescribed at this time.  Behavioral modification strategies: meal planning and cooking strategies and keeping healthy foods in the home.  Margaret Golden has agreed to follow-up with our clinic in 2 to 3 weeks. She was informed of the importance of frequent follow-up visits to maximize her success with intensive lifestyle modifications for her multiple health conditions.   Objective:   Blood pressure 120/72, pulse 100, temperature 98.4 F (36.9 C), height 5\' 7"  (1.702 m), weight (!) 368 lb (166.9 kg), last menstrual period 06/14/2020, SpO2 98 %. Body mass index is 57.64 kg/m.  General: Cooperative, alert, well developed, in no acute distress. HEENT: Conjunctivae and lids unremarkable. Cardiovascular: Regular rhythm.  Lungs: Normal work of breathing. Neurologic: No focal deficits.  Lab Results  Component Value Date   CREATININE 0.77 06/23/2020   BUN 9 06/23/2020   NA 136 06/23/2020   K 3.8 06/23/2020   CL 103 06/23/2020   CO2 24 06/23/2020   Lab Results  Component Value Date   ALT 28 06/23/2020   AST 46 (H) 06/23/2020   ALKPHOS 74 06/23/2020   BILITOT 0.8  06/23/2020   Lab Results  Component Value Date   HGBA1C 4.9 09/03/2019   HGBA1C 5.0 07/30/2019   HGBA1C 4.6 07/06/2016   Lab Results  Component Value Date   INSULIN 29.2 (H) 09/03/2019   Lab Results  Component Value Date   TSH 0.66 11/20/2019   Lab Results  Component Value Date   CHOL 150 09/03/2019   HDL 39 (L) 09/03/2019   LDLCALC 89 09/03/2019   TRIG 120 09/03/2019   CHOLHDL 4 07/21/2019   Lab Results  Component Value Date   WBC 8.5 06/23/2020   HGB 14.8 06/23/2020   HCT 44.0 06/23/2020   MCV 85.6 06/23/2020   PLT 214 06/23/2020   Lab Results  Component Value Date   IRON 54 04/26/2020   TIBC 370 04/26/2020   FERRITIN 66 04/26/2020   Attestation Statements:   Reviewed by clinician on day of visit: allergies, medications, problem list, medical history, surgical history, family history, social history, and previous encounter notes.   Trude Mcburney, am acting as transcriptionist for [employer], PA-C.  I have reviewed the above documentation for accuracy and completeness, and I agree with the above. Alois Cliche, PA-C

## 2020-06-27 NOTE — Anesthesia Preprocedure Evaluation (Addendum)
Anesthesia Evaluation  Patient identified by MRN, date of birth, ID band Patient awake    Reviewed: Allergy & Precautions, NPO status , Patient's Chart, lab work & pertinent test results, reviewed documented beta blocker date and time   History of Anesthesia Complications (+) PONV  Airway Mallampati: II  TM Distance: >3 FB Neck ROM: Full    Dental  (+) Dental Advisory Given, Teeth Intact   Pulmonary asthma , sleep apnea (does not use CPAP) ,  06/23/2020 SARS coronavirus NEG   breath sounds clear to auscultation       Cardiovascular hypertension, Pt. on medications and Pt. on home beta blockers  Rhythm:Regular Rate:Normal  '21 ECHO: EF 60-65%. The left ventricle has normal function, no regional wall motion abnormalities, no significant valvular abnormalities   Neuro/Psych  Headaches, PSYCHIATRIC DISORDERS (ADHD) Anxiety Depression    GI/Hepatic GERD  Medicated and Controlled,  Endo/Other  Hypothyroidism Morbid obesity  Renal/GU      Musculoskeletal  (+) Arthritis ,   Abdominal (+) + obese,   Peds  Hematology   Anesthesia Other Findings   Reproductive/Obstetrics                            Anesthesia Physical Anesthesia Plan  ASA: III  Anesthesia Plan: General   Post-op Pain Management:    Induction: Intravenous  PONV Risk Score and Plan: 4 or greater and Ondansetron, Dexamethasone and Scopolamine patch - Pre-op  Airway Management Planned: Oral ETT and Double Lumen EBT  Additional Equipment: Arterial line  Intra-op Plan:   Post-operative Plan: Extubation in OR  Informed Consent: I have reviewed the patients History and Physical, chart, labs and discussed the procedure including the risks, benefits and alternatives for the proposed anesthesia with the patient or authorized representative who has indicated his/her understanding and acceptance.     Dental advisory given  Plan  Discussed with: CRNA and Surgeon  Anesthesia Plan Comments:        Anesthesia Quick Evaluation

## 2020-06-27 NOTE — Anesthesia Procedure Notes (Signed)
Arterial Line Insertion Start/End3/28/2022 12:30 PM, 06/27/2020 12:37 PM Performed by: Jairo Ben, MD, Colon Flattery, CRNA, CRNA  Patient location: OR. Preanesthetic checklist: patient identified, IV checked, site marked, risks and benefits discussed, surgical consent, monitors and equipment checked, pre-op evaluation, timeout performed and anesthesia consent Left, radial was placed Catheter size: 20 G Hand hygiene performed  and maximum sterile barriers used   Procedure performed without using ultrasound guided technique. Following insertion, Biopatch and dressing applied. Post procedure assessment: normal  Patient tolerated the procedure well with no immediate complications.

## 2020-06-27 NOTE — Hospital Course (Signed)
History of Present Illness:     Margaret Golden 30 y.o. female referred by Dr. Tonia Brooms for surgical evaluation of mediastinal adenopathy.  She first developed some exertional dyspnea and 2020 was noted to be anemic initially.  This was thought to be due to heavy menstruation cycles.  She was scheduled to undergo D&C but during her preoperative evaluation she was referred to cardiology for further work-up of her chest pain and shortness of breath.  Cross-sectional imaging that was obtained showed mediastinal and hilar adenopathy.  She subsequently underwent a endobronchial biopsy of the lymph nodes which was negative for any malignancy.  On recent PET/CT she was noted to have avidity in a supraclavicular lymph node, mediastinal and hilar lymph nodes, small pulmonary nodules, and portacaval lymph nodes.   In regards to her symptoms she remains short of breath along with some chest tightness.  She states that she has a sharp pain even with prolonged conversations which she currently has during the interview today.  Her weight has been stable.  She denies any night sweats.  She is very anxious about obtaining a diagnosis and ultimately being treated for this.   She was originally scheduled to undergo a ultrasound-guided biopsy of an avid nodule in the supraclavicular region but this was unsuccessful.  She comes in today to discuss further options.  Hospital Course:   Margaret Golden underwent a robotic-assisted thorascopy with lysis of adhesions, lymph node biopsies, and intercostal nerve block. She tolerated the procedure well and was transferred to the step down unit for continued care.

## 2020-06-27 NOTE — Anesthesia Procedure Notes (Addendum)
Procedure Name: Intubation Date/Time: 06/27/2020 12:30 PM Performed by: Lowella Dell, CRNA Pre-anesthesia Checklist: Patient identified, Emergency Drugs available, Suction available and Patient being monitored Patient Re-evaluated:Patient Re-evaluated prior to induction Oxygen Delivery Method: Circle System Utilized Preoxygenation: Pre-oxygenation with 100% oxygen Induction Type: IV induction Ventilation: Two handed mask ventilation required Laryngoscope Size: Miller and 2 (multiple attempts to place DLT, see note below) Grade View: Grade I Tube type: Oral Tube size: 7.5 mm Number of attempts: 4 Airway Equipment and Method: Stylet Placement Confirmation: ETT inserted through vocal cords under direct vision,  positive ETCO2 and breath sounds checked- equal and bilateral Secured at: 24 cm Tube secured with: Tape Dental Injury: Teeth and Oropharynx as per pre-operative assessment and Bloody posterior oropharynx  Comments: *Difficult DLT placement, easy standard intubation.* IV induction by Dr Gloris Manchester. Multiple attempts by SRNA, Dr Gloris Manchester and Dr Glennon Mac to place and navigate DLT using Sabra Heck 2, Mac 3 and Mac4 as well as VivaSight and standard DLT with disposable bronchoscope. Single lumen ETT placed easily by Dr Barbaraann Cao-- Dr Kipp Brood ok to proceed with single lumen ETT with coordinated breath-holds for procedure.

## 2020-06-27 NOTE — Op Note (Signed)
      [address]       [name] [address]             [phone]        06/27/2020  Patient:  Margaret Golden Pre-Op Dx: Mediastinal and hilar lymphadenopathy Post-op Dx: Same Procedure: - Robotic assisted right video thoracoscopy -Lysis of adhesions - Mediastinal lymph node sampling - Intercostal nerve block  Surgeon and Role:      * Jamillah Camilo, Eliezer Lofts, MD - Primary    *T. Asa Lente, PA-C- assisting  Anesthesia  general EBL: Minimal  Blood Administration: None Specimen: Station 7 node station 10 R lymph nodes  Drains: 28 F argyle chest tube in right chest Counts: correct   Indications: 30 year old female mediastinal and hilar lymphadenopathy along with supraclavicular lymphadenopathy as well as portal caval lymphadenopathy. Based off of the pet imaging this is very concerning for lymphoma. She has undergone a navigational bronchoscopy which was nondiagnostic. She has been to discuss potential for surgical biopsy. She is very morbidly obese with a BMI of 58.  Given that she was unable to undergo the ultrasound-guided biopsy we will plan for a mediastinoscopy and possible right VATS lymph node biopsy.  Findings: Once the patient was intubated and sedated I assess the potential for performing the mediastinoscopy, but due to her body habitus, I thought that this would be very challenging and risky.  We therefore elected to proceed with a right thoracoscopy.  She had enlarged lymph nodes at stations 7 in 10R.  These were easily identified, and taken for specimen.  Operative Technique: After the risks, benefits and alternatives were thoroughly discussed, the patient was brought to the operative theatre.  Anesthesia was induced, and the patient was then placed in a left lateral decubitus position and was prepped and draped in normal sterile fashion.  An appropriate surgical pause was performed, and pre-operative antibiotics were dosed accordingly.  We began by  placing our 4 robotic ports in the the 7th intercostal space targeting the hilum of the lung.  The robot was then docked and all instruments were passed under direct visualization.    The lung was then retracted anteriorly and the station 7 lymph nodes easily identifiable.  A combination of blunt dissection and cautery of these lymph nodes were excised and passed off the specimen.  The lobe was then mobilized inferiorly and we looked in the fossa superior to the azygos vein.  There were no lymph nodes are identified.  Inferior to the azygos vein the 10 R lymph nodes were identified and taken in the specimen.  An intercostal nerve block was performed under direct visualization.  A 28 F chest with then placed, and we watch the remaining lobes re-expand.  The skin and soft tissue were closed with absorbable suture    The patient tolerated the procedure without any immediate complications, and was transferred to the PACU in stable condition.  Rusti Arizmendi Keane Scrape

## 2020-06-27 NOTE — Anesthesia Postprocedure Evaluation (Signed)
Anesthesia Post Note  Patient: [name]  Procedure(s) Performed: RIGHT XI ROBOTIC ASSISTED THORASCOPY WITH LYSIS OF ADHESIONS (Right Chest) LYMPH NODE BIOPSY (Right Chest) INTERCOSTAL NERVE BLOCK (Right Chest)     Patient location during evaluation: PACU Anesthesia Type: General Level of consciousness: awake and alert, patient cooperative and oriented Pain control: pain improving. Vital Signs Assessment: post-procedure vital signs reviewed and stable Respiratory status: spontaneous breathing, nonlabored ventilation, respiratory function stable and patient connected to nasal cannula oxygen Cardiovascular status: blood pressure returned to baseline and stable Postop Assessment: no apparent nausea or vomiting Anesthetic complications: no   No complications documented.  Last Vitals:  Vitals:   06/27/20 1010 06/27/20 1500  BP: (!) 156/98   Pulse: 90   Resp: 20   Temp: 36.8 C 36.8 C  SpO2: 98%     Last Pain:  Vitals:   06/27/20 1500  TempSrc:   PainSc: Asleep                 Theophile Harvie,E. Reigna Ruperto

## 2020-06-28 ENCOUNTER — Encounter (HOSPITAL_COMMUNITY): Payer: Self-pay | Admitting: Thoracic Surgery (Cardiothoracic Vascular Surgery)

## 2020-06-28 ENCOUNTER — Observation Stay (HOSPITAL_COMMUNITY): Payer: BC Managed Care – PPO

## 2020-06-28 DIAGNOSIS — E039 Hypothyroidism, unspecified: Secondary | ICD-10-CM | POA: Diagnosis not present

## 2020-06-28 DIAGNOSIS — Z79899 Other long term (current) drug therapy: Secondary | ICD-10-CM | POA: Diagnosis not present

## 2020-06-28 DIAGNOSIS — Z6841 Body Mass Index (BMI) 40.0 and over, adult: Secondary | ICD-10-CM | POA: Diagnosis not present

## 2020-06-28 DIAGNOSIS — R911 Solitary pulmonary nodule: Secondary | ICD-10-CM | POA: Diagnosis not present

## 2020-06-28 DIAGNOSIS — R002 Palpitations: Secondary | ICD-10-CM | POA: Diagnosis not present

## 2020-06-28 DIAGNOSIS — J90 Pleural effusion, not elsewhere classified: Secondary | ICD-10-CM | POA: Diagnosis not present

## 2020-06-28 DIAGNOSIS — D649 Anemia, unspecified: Secondary | ICD-10-CM | POA: Diagnosis not present

## 2020-06-28 DIAGNOSIS — Z7951 Long term (current) use of inhaled steroids: Secondary | ICD-10-CM | POA: Diagnosis not present

## 2020-06-28 DIAGNOSIS — F909 Attention-deficit hyperactivity disorder, unspecified type: Secondary | ICD-10-CM | POA: Diagnosis not present

## 2020-06-28 DIAGNOSIS — F419 Anxiety disorder, unspecified: Secondary | ICD-10-CM | POA: Diagnosis not present

## 2020-06-28 DIAGNOSIS — J9811 Atelectasis: Secondary | ICD-10-CM | POA: Diagnosis not present

## 2020-06-28 DIAGNOSIS — M19071 Primary osteoarthritis, right ankle and foot: Secondary | ICD-10-CM | POA: Diagnosis not present

## 2020-06-28 DIAGNOSIS — G2581 Restless legs syndrome: Secondary | ICD-10-CM | POA: Diagnosis not present

## 2020-06-28 DIAGNOSIS — J939 Pneumothorax, unspecified: Secondary | ICD-10-CM | POA: Diagnosis not present

## 2020-06-28 DIAGNOSIS — Z791 Long term (current) use of non-steroidal anti-inflammatories (NSAID): Secondary | ICD-10-CM | POA: Diagnosis not present

## 2020-06-28 DIAGNOSIS — E662 Morbid (severe) obesity with alveolar hypoventilation: Secondary | ICD-10-CM | POA: Diagnosis not present

## 2020-06-28 DIAGNOSIS — R590 Localized enlarged lymph nodes: Secondary | ICD-10-CM | POA: Diagnosis not present

## 2020-06-28 DIAGNOSIS — R079 Chest pain, unspecified: Secondary | ICD-10-CM | POA: Diagnosis not present

## 2020-06-28 DIAGNOSIS — F329 Major depressive disorder, single episode, unspecified: Secondary | ICD-10-CM | POA: Diagnosis not present

## 2020-06-28 DIAGNOSIS — R0602 Shortness of breath: Secondary | ICD-10-CM | POA: Diagnosis not present

## 2020-06-28 LAB — GLUCOSE, CAPILLARY
Glucose-Capillary: 118 mg/dL — ABNORMAL HIGH (ref 70–99)
Glucose-Capillary: 106 mg/dL — ABNORMAL HIGH (ref 70–99)

## 2020-06-28 LAB — BASIC METABOLIC PANEL
Anion gap: 8 (ref 5–15)
BUN: 9 mg/dL (ref 6–20)
CO2: 21 mmol/L — ABNORMAL LOW (ref 22–32)
Calcium: 8.2 mg/dL — ABNORMAL LOW (ref 8.9–10.3)
Chloride: 105 mmol/L (ref 98–111)
Creatinine, Ser: 0.84 mg/dL (ref 0.44–1.00)
GFR, Estimated: >60 mL/min (ref >60)
Glucose, Bld: 108 mg/dL — ABNORMAL HIGH (ref 70–99)
Potassium: 4.5 mmol/L (ref 3.5–5.1)
Sodium: 134 mmol/L — ABNORMAL LOW (ref 135–145)

## 2020-06-28 LAB — CBC
HCT: 37.4 % (ref 36.0–46.0)
Hemoglobin: 12.9 g/dL (ref 12.0–15.0)
MCH: 29.1 pg (ref 26.0–34.0)
MCHC: 34.5 g/dL (ref 30.0–36.0)
MCV: 84.2 fL (ref 80.0–100.0)
Platelets: 177 10*3/uL (ref 150–400)
RBC: 4.44 MIL/uL (ref 3.87–5.11)
RDW: 14.5 % (ref 11.5–15.5)
WBC: 12.4 10*3/uL — ABNORMAL HIGH (ref 4.0–10.5)
nRBC: 0 % (ref 0.0–0.2)

## 2020-06-28 MED ORDER — TRAMADOL HCL 50 MG PO TABS: 50.0000 mg | ORAL_TABLET | Freq: Four times a day (QID) | ORAL | 0 refills | Status: DC | PRN

## 2020-06-28 MED ORDER — TOPIRAMATE 25 MG PO TABS: 100.0000 mg | ORAL_TABLET | Freq: Every day | ORAL | Status: DC

## 2020-06-28 NOTE — Progress Notes (Addendum)
      [address]       [place] [address]             [phone]      1 Day Post-Op Procedure(s) (LRB): RIGHT XI ROBOTIC ASSISTED THORASCOPY WITH LYSIS OF ADHESIONS (Right) LYMPH NODE BIOPSY (Right) INTERCOSTAL NERVE BLOCK (Right) Subjective: Pain is better controlled this morning. No other issues overnight.   Objective: Vital signs in last 24 hours: Temp:  [97.8 F (36.6 C)-98.9 F (37.2 C)] 98.9 F (37.2 C) (03/29 0759) Pulse Rate:  [74-100] 88 (03/29 0759) Cardiac Rhythm: Normal sinus rhythm (03/29 0320) Resp:  [12-20] 20 (03/29 0759) BP: (102-156)/(65-98) 108/69 (03/29 0759) SpO2:  [90 %-98 %] 96 % (03/29 0759) Arterial Line BP: (123-144)/(63-80) 123/63 (03/28 1530) Weight:  [170.1 kg] 170.1 kg (03/28 1010)     Intake/Output from previous day: 03/28 0701 - 03/29 0700 In: 1700 [I.V.:1700] Out: 1449 [Urine:1250; Blood:15; Chest Tube:184] Intake/Output this shift: No intake/output data recorded.  General appearance: alert, cooperative and no distress Heart: regular rate and rhythm, S1, S2 normal, no murmur, click, rub or gallop Lungs: clear to auscultation bilaterally Abdomen: soft, non-tender; bowel sounds normal; no masses,  no organomegaly Extremities: extremities normal, atraumatic, no cyanosis or edema Wound: clean and dry  Lab Results: Recent Labs    06/28/20 0703  WBC 12.4*  HGB 12.9  HCT 37.4  PLT 177   BMET: No results for input(s): NA, K, CL, CO2, GLUCOSE, BUN, CREATININE, CALCIUM in the last 72 hours.  PT/INR: No results for input(s): LABPROT, INR in the last 72 hours. ABG    Component Value Date/Time   PHART 7.446 06/23/2020 1536   HCO3 23.0 06/23/2020 1536   ACIDBASEDEF 0.5 06/23/2020 1536   O2SAT 98.3 06/23/2020 1536   CBG (last 3)  Recent Labs    06/27/20 1956 06/27/20 2336 06/28/20 0346  GLUCAP 139* 141* 118*    Assessment/Plan: S/P Procedure(s) (LRB): RIGHT XI ROBOTIC ASSISTED THORASCOPY WITH LYSIS OF  ADHESIONS (Right) LYMPH NODE BIOPSY (Right) INTERCOSTAL NERVE BLOCK (Right)  1. CV- NSR in the 80s, BP well controlled 2. Pulm- tolerating 2L [place] with good oxygen saturation. Chest tube to water seal. CXR: Right chest tube in stable position. Tiny right apical pneumothorax again noted. Mild right apical pleural thickening possibly loculated effusion noted on today's exam. Reference is made to prior PET-CT report 04/27/2020 for discussion of other findings within the chest including tiny pulmonary nodules 3. H and H stable at 12.9/37.4 4. Endo-blood glucose well controlled 5. Back on home medications for depression/anxiety/insomnia 6. Hypothyroidism-back on home dose synthroid  Plan: Chest tube out this morning and CXR around noon. If okay, then will send the patient home later today. Her dad will be assisting in her recovery and flew out this way from [place]. We will prepare for her potential discharge later today. Weaning oxygen as tolerated.      LOS: 1 day    Jari Favre, [place] 06/28/2020

## 2020-06-28 NOTE — Progress Notes (Signed)
CT removed per order. Pt tolerated well. Will continue to monitor.  Versie Starks, RN

## 2020-06-28 NOTE — Evaluation (Signed)
Physical Therapy Evaluation & Discharge Patient Details Name: Margaret Golden MRN: [id number] DOB: Feb 26, 1991 Today's Date: 06/28/2020   History of Present Illness  Pt is a 30 y.o. female admitted 06/27/20 with mediastinal adenopathy. S/p R-side robotic assisted thorascopy with lysis of adhesions, lymph node biopy on 3/28. PMH includes obesity, bilateral ankle arthritis, OSA, exercise-induced asthma, ADD, depression, anxiety.    Clinical Impression  Patient evaluated by Physical Therapy with no further acute PT needs identified. PTA, pt independent, lives alone, not currently working, primarily sedentary; pt's father staying with pt to provide assist upon d/c. Today, pt able to mobilize and perform ADL tasks without assist. Educ re: activity recommendations (including walking program during and after recover), energy conservation strategies. All education has been completed and the patient has no further questions. Acute PT is signing off. Thank you for this referral.  SpO2 93-99% on RA with activity    Follow Up Recommendations No PT follow up    Equipment Recommendations  None recommended by PT    Recommendations for Other Services       Precautions / Restrictions Precautions Precautions: Fall Restrictions Weight Bearing Restrictions: No      Mobility  Bed Mobility Overal bed mobility: Modified Independent             General bed mobility comments: HOB elevated    Transfers Overall transfer level: Independent Equipment used: None                Ambulation/Gait Ambulation/Gait assistance: Supervision Gait Distance (Feet): 180 Feet Assistive device: None Gait Pattern/deviations: Step-through pattern;Decreased stride length;Wide base of support Gait velocity: Decreased   General Gait Details: Slow, steady gait without DME, supervision for safety and SpO2 monitoring; 1x self-corrected instability which pt attributes to ankle arthritis. Pt with DOE 3/4 (wearing  face mask and conversant); SpO2 93-99% on RA.  Stairs            Wheelchair Mobility    Modified Rankin (Stroke Patients Only)       Balance Overall balance assessment: No apparent balance deficits (not formally assessed)                                           Pertinent Vitals/Pain Pain Assessment: Faces Faces Pain Scale: Hurts a little bit Pain Location: Chest tube removal site Pain Descriptors / Indicators: Discomfort Pain Intervention(s): Monitored during session;Limited activity within patient's tolerance    Home Living Family/patient expects to be discharged to:: Private residence Living Arrangements: Alone Available Help at Discharge: Family;Available 24 hours/day Type of Home: House (condo) Home Access: Stairs to enter   [employer] of Steps: 1 Home Layout: One level Home Equipment: Shower seat - built in Additional Comments: Father visiting town to stay with patient a couple weeks    Prior Function Level of Independence: Independent         Comments: Not currently working. Enjoys caring for cats, computer games     Hand Dominance        Extremity/Trunk Assessment   Upper Extremity Assessment Upper Extremity Assessment: Overall WFL for tasks assessed    Lower Extremity Assessment Lower Extremity Assessment: Overall WFL for tasks assessed       Communication   Communication: No difficulties  Cognition Arousal/Alertness: Awake/alert Behavior During Therapy: WFL for tasks assessed/performed Overall Cognitive Status: Within Functional Limits for tasks assessed  General Comments General comments (skin integrity, edema, etc.): Pt's dad present and supportive. Educ pt re: activity recommendations (including walking program during and after recovery), weight loss (pt reports recently re-joining community program for healthy weight habits)    Exercises      Assessment/Plan    PT Assessment Patent does not need any further PT services  PT Problem List         PT Treatment Interventions      PT Goals (Current goals can be found in the Care Plan section)  Acute Rehab PT Goals Patient Stated Goal: Home today PT Goal Formulation: All assessment and education complete, DC therapy    Frequency     Barriers to discharge        Co-evaluation               AM-PAC PT "6 Clicks" Mobility  Outcome Measure Help needed turning from your back to your side while in a flat bed without using bedrails?: None Help needed moving from lying on your back to sitting on the side of a flat bed without using bedrails?: None Help needed moving to and from a bed to a chair (including a wheelchair)?: None Help needed standing up from a chair using your arms (e.g., wheelchair or bedside chair)?: None Help needed to walk in hospital room?: A Little Help needed climbing 3-5 steps with a railing? : A Little 6 Click Score: 22    End of Session   Activity Tolerance: Patient tolerated treatment well Patient left: in chair;with call bell/phone within reach;with family/visitor present Nurse Communication: Mobility status PT Visit Diagnosis: Other abnormalities of gait and mobility (R26.89)    Time: 1430-1445 PT Time Calculation (min) (ACUTE ONLY): 15 min   Charges:   PT Evaluation $PT Eval Low Complexity: 1 Low     Ina Homes, PT, DPT Acute Rehabilitation Services  Pager [phone] Office [phone]  Malachy Chamber 06/28/2020, 2:58 PM

## 2020-06-28 NOTE — Discharge Summary (Signed)
Physician Discharge Summary       [address]       Margaret Golden [address]             [phone]       Patient ID: Margaret Golden MRN: [id number] DOB/AGE: [birth year] 30 y.o.  Admit date: 06/27/2020 Discharge date: 06/28/2020  Admission Diagnoses:  Patient Active Problem List   Diagnosis Date Noted  . Arthritis of right subtalar joint 06/24/2020  . Adenopathy 03/31/2020  . Other allergic rhinitis 03/23/2020  . Adverse reaction to food, subsequent encounter 03/23/2020  . Shortness of breath 03/23/2020  . Heartburn 03/23/2020  . Drug reaction 03/23/2020  . Acid reflux   . ADD (attention deficit disorder)   . Anemia   . Anxiety   . Arthritis   . Back pain   . Chest pain   . Dyspnea   . Exercise-induced asthma   . Fatty liver   . History of blood transfusion   . History of chicken pox   . History of kidney stones   . Joint pain   . Lower extremity edema   . Major depressive disorder   . Pneumonia   . Thyroid disease   . Sleep apnea 02/23/2020  . History of iron deficiency anemia 02/23/2020  . BMI 50.0-59.9, adult (HCC) 02/23/2020  . Mediastinal lymphadenopathy 12/23/2019  . Pulmonary nodules 12/23/2019  . RLS (restless legs syndrome) 12/21/2019  . Super obesity 12/21/2019  . Papilledema 12/21/2019  . Obesity with alveolar hypoventilation and body mass index (BMI) of 40 or greater (HCC) 12/21/2019  . Morning headache 12/21/2019  . NSVT (nonsustained ventricular tachycardia) (HCC) 12/09/2019  . Mixed hyperlipidemia 12/09/2019  . Palpitations 12/09/2019  . IDA (iron deficiency anemia) 10/20/2019  . Hematometra 07/15/2019  . Menorrhagia with regular cycle 07/15/2019  . Symptomatic anemia 04/07/2019  . Social phobia 02/26/2018  . Major depressive disorder, recurrent episode, mild (HCC) 02/26/2018  . Insomnia 02/26/2018  . Left ankle pain 02/28/2017  . Class 3 severe obesity due to excess calories with serious comorbidity in adult (HCC)  05/11/2016  . Fatty liver disease, nonalcoholic 02/02/2015  . Depression 01/03/2015  . Hypothyroidism 01/03/2015  . GERD (gastroesophageal reflux disease) 01/03/2015  . Migraines 01/03/2015  . Essential hypertension 06/02/2014    Discharge Diagnoses:  Active Problems:   S/P robot-assisted surgical procedure   Discharged Condition: good  History of Present Illness:     Margaret Golden 30 y.o. female referred by Dr. Tonia Brooms for surgical evaluation of mediastinal adenopathy.  She first developed some exertional dyspnea and 2020 was noted to be anemic initially.  This was thought to be due to heavy menstruation cycles.  She was scheduled to undergo D&C but during her preoperative evaluation she was referred to cardiology for further work-up of her chest pain and shortness of breath.  Cross-sectional imaging that was obtained showed mediastinal and hilar adenopathy.  She subsequently underwent a endobronchial biopsy of the lymph nodes which was negative for any malignancy.  On recent PET/CT she was noted to have avidity in a supraclavicular lymph node, mediastinal and hilar lymph nodes, small pulmonary nodules, and portacaval lymph nodes.   In regards to her symptoms she remains short of breath along with some chest tightness.  She states that she has a sharp pain even with prolonged conversations which she currently has during the interview today.  Her weight has been stable.  She denies any night sweats.  She is very anxious about  obtaining a diagnosis and ultimately being treated for this.   She was originally scheduled to undergo a ultrasound-guided biopsy of an avid nodule in the supraclavicular region but this was unsuccessful.  She comes in today to discuss further options.  Hospital Course:   [name] underwent a robotic-assisted thorascopy with lysis of adhesions, lymph node biopsies, and intercostal nerve block. She tolerated the procedure well and was transferred to the step down  unit for continued care. POD 1 her CXR was stable therefore we removed her chest tube. Her CXR showed stable tiny right apical pneumothoax. Her home medications have been restarted. We arranged a follow-up appointment for her with Dr. Cliffton AstersLightfoot. Today, she is ambulating with limited assistance, tolerated room air, her incisions are healing well, and she is ready for discharge home.    Consults: None  Significant Diagnostic Studies:  No read available. CXR stable, perhaps a small right apical pneumothorax which is smaller compared to the previous image.   Previous Image:   CLINICAL DATA:  Chest tube.  EXAM: CHEST  1 VIEW  COMPARISON:  06/27/2020.  PET-CT 04/27/2020.  FINDINGS: Right chest tube in stable position. Tiny right apical pneumothorax again noted. Mild right apical pleural thickening possibly loculated effusion noted on today's exam. Low lung volumes with persistent bibasilar atelectasis. Reference is made to prior PET-CT report of 04/27/2020 for discussion of other findings within the chest including tiny pulmonary nodules. Heart size stable. No acute bony abnormality.  IMPRESSION: Right chest tube in stable position. Tiny right apical pneumothorax again noted. Mild right apical pleural thickening possibly loculated effusion noted on today's exam. Reference is made to prior PET-CT report 04/27/2020 for discussion of other findings within the chest including tiny pulmonary nodules.   Electronically Signed   By: Maisie Fushomas  Register   On: 06/28/2020 05:29     Treatments:   06/27/2020  2:53 PM  PATIENT:  Margaret Golden  30 y.o. female  PRE-OPERATIVE DIAGNOSIS:  MEDIASTINAL ADENOPATHY  POST-OPERATIVE DIAGNOSIS:  MEDIASTINAL ADENOPATHY  PROCEDURE:  Procedure(s): RIGHT XI ROBOTIC ASSISTED THORASCOPY WITH LYSIS OF ADHESIONS (Right) LYMPH NODE BIOPSY (Right) INTERCOSTAL NERVE BLOCK (Right)  SURGEON:  Surgeon(s) and Role:    * Lightfoot, Eliezer LoftsHarrell O,  MD - Primary  PHYSICIAN ASSISTANT:  Jari Favreessa Destin Vinsant, PA-C   ANESTHESIA:   general  EBL:  15 mL   BLOOD ADMINISTERED:none  DRAINS:  ONE BLAKE DRAIN    LOCAL MEDICATIONS USED:  BUPIVICAINE   SPECIMEN:  Source of Specimen:  LYMPH NODE BIOPSIES  DISPOSITION OF SPECIMEN:  PATHOLOGY  COUNTS:  YES  DICTATION: .Dragon Dictation  PLAN OF CARE: Admit to inpatient   PATIENT DISPOSITION:  PACU - hemodynamically stable.   Delay start of Pharmacological VTE agent (>24hrs) due to surgical blood loss or risk of bleeding: no   Discharge Exam: Blood pressure 106/80, pulse 91, temperature 98.9 F (37.2 C), temperature source Oral, resp. rate 20, height 5\' 7"  (1.702 m), weight (!) 170.1 kg, last menstrual period 06/14/2020, SpO2 96 %.   General appearance: alert, cooperative and no distress Heart: regular rate and rhythm, S1, S2 normal, no murmur, click, rub or gallop Lungs: clear to auscultation bilaterally Abdomen: soft, non-tender; bowel sounds normal; no masses,  no organomegaly Extremities: extremities normal, atraumatic, no cyanosis or edema Wound: clean and dry   Disposition: Discharge disposition: 01-Home or Self Care        Allergies as of 06/28/2020      Reactions   Augmentin [amoxicillin-pot Clavulanate] Hives  Medication List    STOP taking these medications   ibuprofen 800 MG tablet Commonly known as: ADVIL     TAKE these medications   albuterol 108 (90 Base) MCG/ACT inhaler Commonly known as: VENTOLIN HFA Inhale 2 puffs into the lungs every 6 (six) hours as needed for wheezing or shortness of breath (Chest tightness).   Armodafinil 150 MG tablet Take 1 tablet (150 mg total) by mouth daily.   aspirin-acetaminophen-caffeine 250-250-65 MG tablet Commonly known as: EXCEDRIN MIGRAINE Take 2 tablets by mouth daily as needed for headache or migraine.   furosemide 20 MG tablet Commonly known as: LASIX Take 1 tablet (20 mg total) by mouth 2  (two) times a week.   Fusion Plus Caps One capsule twice daily with orange juice. Stop your Ferrous sulfate. What changed:   how much to take  how to take this  when to take this  additional instructions   lamoTRIgine 200 MG tablet Commonly known as: LAMICTAL Take 1 tablet (200 mg total) by mouth daily.   levothyroxine 100 MCG tablet Commonly known as: SYNTHROID Take 100 mcg by mouth daily before breakfast. Take with the 112 mcg dose to equal 212 mcg daily   levothyroxine 112 MCG tablet Commonly known as: SYNTHROID Take 112 mcg by mouth daily before breakfast. Take with the 100 mcg dose to equal 212 mcg daily   LORazepam 0.5 MG tablet Commonly known as: Ativan Take 1 tablet (0.5 mg total) by mouth every 8 (eight) hours as needed for anxiety.   multivitamin with minerals tablet Take 1 tablet by mouth daily.   omeprazole 20 MG capsule Commonly known as: PRILOSEC Take 2 capsules (40 mg total) by mouth daily.   potassium chloride SA 20 MEQ tablet Commonly known as: KLOR-CON Take 1 tablet (20 mEq total) by mouth 2 (two) times a week.   propranolol 20 MG tablet Commonly known as: INDERAL Take 1 tablet (20 mg total) by mouth 3 (three) times daily.   sertraline 100 MG tablet Commonly known as: ZOLOFT Take 2 tablets (200 mg total) by mouth daily.   topiramate 50 MG tablet Commonly known as: Topamax Start with 50mg (1 tab) at bedtime. In 1 week increase to 100mg (2 tabs) at bedtime. What changed:   how much to take  how to take this  when to take this  additional instructions   traMADol 50 MG tablet Commonly known as: ULTRAM Take 1-2 tablets (50-100 mg total) by mouth every 6 (six) hours as needed (mild pain).   traZODone 50 MG tablet Commonly known as: DESYREL Take 1/2-2 po QHS prn insomnia What changed:   how much to take  how to take this  when to take this  reasons to take this  additional instructions   Vitamin D (Ergocalciferol) 1.25 MG  (50000 UNIT) Caps capsule Commonly known as: DRISDOL Take 1 capsule (50,000 Units total) by mouth 2 (two) times a week. What changed: additional instructions       Follow-up Information    , NP. Call in 1 day(s).   Specialty: Internal Medicine Contact information: [name] Lysle Dingwall [address] [phone]        [id number], DO .   Specialty: Cardiology Contact information: [address] [place] [address] [place] [phone]        [id number], MD Follow up.   Specialty: Cardiothoracic Surgery Why: Your routine follow-up appointment is on 07/08/2020 at 11:45am with Dr. Corliss Skains. Please bring your  hospital paperwork.  Contact information: [address] [place] [address] [phone]               Signed: Sharlene Dory 06/28/2020, 1:19 PM

## 2020-06-28 NOTE — Discharge Instructions (Signed)
Robot-Assisted Thoracic Surgery ° ° ° °Robot-assisted thoracic surgery is a procedure in which robotic arms and surgical instruments are used to perform complex procedures through small incisions. During surgery, the surgeon sits at a console inside of the operating room and uses controls at the console to move the robotic arms. Surgical instruments are attached to the ends of the robotic arms. Instruments include a tool with a light and camera on the end of it (thoracoscope). The camera sends images to a video monitor that your surgeon will use to view the inside of your thoracic area. The thoracic area is between the neck and abdomen. °Robot-assisted thoracic surgery may be done to: °· Remove a tissue sample to be tested in a lab (biopsy). °· Remove a part of a lung (lobectomy) or the whole lung (pneumonectomy). °· Remove tumors. °· Treat other conditions that affect: °? Your esophagus. This is the part of your body that carries food and liquid from your mouth to your stomach. °? Your diaphragm. This is a muscle wall between your lungs and stomach area. It helps with breathing. °· Remove a portion of the nerves (sympathectomy) that cause excessive sweating. °Tell a health care provider about: °· Any allergies you have. This is especially important if you have allergies to medicines or sedatives. °· All medicines you are taking, including vitamins, herbs, eye drops, creams, and over-the-counter medicines. °· Any problems you or family members have had with anesthetic medicines. °· Any blood disorders you have. °· Any surgeries you have had. °· Any medical conditions you have. °· Whether you are pregnant or may be pregnant. °What are the risks? °Generally, this is a safe procedure. However, problems may occur, including: °· Infection, such as pneumonia. °· Severe bleeding (hemorrhage). °· Allergic reactions to medicines. °· Damage to organs or structures such as nerves or blood vessels. °What happens before the  procedure? °Staying hydrated °Follow instructions from your health care provider about hydration, which may include: °· Up to 2 hours before the procedure - you may continue to drink clear liquids, such as water, clear fruit juice, black coffee, and plain tea. °Eating and drinking restrictions °Follow instructions from your health care provider about eating and drinking, which may include: °· 8 hours before the procedure - stop eating heavy meals or foods, such as meat, fried foods, or fatty foods. °· 6 hours before the procedure - stop eating light meals or foods, such as toast or cereal. °· 6 hours before the procedure - stop drinking milk or drinks that contain milk. °· 2 hours before the procedure - stop drinking clear liquids. °Medicines °· Ask your health care provider about: °? Changing or stopping your regular medicines. This is especially important if you are taking diabetes medicines or blood thinners. °? Taking medicines such as aspirin and ibuprofen. These medicines can thin your blood. Do not take these medicines unless your health care provider tells you to take them. °? Taking over-the-counter medicines, vitamins, herbs, and supplements. °· Talk with your health care provider about safe and effective ways to manage pain before and after your procedure. Pain management should fit your specific health needs. °Tests °· You may have tests, such as: °? CT scan. °? Ultrasound. °? Chest X-ray. °? Electrocardiogram (ECG). °· You may have a blood or urine sample taken. °General instructions °· Ask your health care provider: °? How your surgery site will be marked. °? What steps will be taken to help prevent infection. These steps may include: °§   Removing hair at the surgery site. °§ Washing skin with a germ-killing soap. °§ Receiving antibiotic medicine. °· Do not use any products that contain nicotine or tobacco for at least 4 weeks before the procedure. These products include cigarettes, chewing tobacco, and  vaping devices, such as e-cigarettes. If you need help quitting, ask your health care provider. °· Plan to have a responsible adult take you home from the hospital or clinic. °· Plan to have a responsible adult care for you for the time you are told after you leave the hospital or clinic. This is important. °What happens during the procedure? °· An IV will be inserted into one of your veins. °· You will be given one or more of the following: °? A medicine to help you relax (sedative). °? A medicine to make you fall asleep (general anesthetic). °? A medicine that is injected into an area of your body to numb everything below the injection site (regional anesthetic). °· One to four small incisions will be made. The number of incisions and the area where incisions are made will depend on the purpose of your procedure. °· A surgical assistant will then place instruments that are attached to the robotic arms into your thoracic cavity through these small incisions. The robotic arms are equipped with different surgical instruments and a high-definition 3D camera. °· The surgeon will sit at a control console and see a magnified, high-resolution 3D image of the surgical field on a monitor. The surgeon will use master controls from the console that respond to the surgeon's movements in real time. °· The computer-assisted robot will translate the surgeon's hand, wrist, or finger movements into precise movements of the four robotic arms so that the necessary procedures can be performed. °· A drainage tube may be placed to drain excess fluid from the chest cavity. °· Your incisions will be closed with stitches (sutures) or staples and covered with a bandage (dressing). °The procedure may vary among health care providers and hospitals. °What happens after the procedure? °· Your blood pressure, heart rate, breathing rate, and blood oxygen level will be monitored until you leave the hospital or clinic. °· You may have a drainage  tube in place. It may stay in place for a few days after the procedure to monitor for signs of air or fluid buildup in the chest cavity. °Summary °· Robot-assisted thoracic surgery is a procedure in which robotic arms and surgical instruments are used to help perform complex procedures through small incisions. During surgery, the surgeon sits at a console in the operating room and uses controls at the console to move the robotic arms. °· A drainage tube may be placed to drain excess fluid from the chest cavity. The tube will be closely monitored for fluid or air buildup in the chest cavity. °This information is not intended to replace advice given to you by your health care provider. Make sure you discuss any questions you have with your health care provider. °Document Revised: 12/14/2019 Document Reviewed: 12/14/2019 °Elsevier Patient Education © 2021 [employer] ° °

## 2020-06-28 NOTE — Progress Notes (Signed)
Patient given discharge instructions. Father present. PIV removed. Telemetry box removed, CCMD notified. Guest services called to transport patient to vehicle by wheelchair.  Kenard Gower, RN

## 2020-06-28 NOTE — Progress Notes (Signed)
Mobility Specialist - Progress Note   06/28/20 1353  Mobility  Activity Ambulated to bathroom  Level of Assistance Standby assist, set-up cues, supervision of patient - no hands on  Assistive Device None  Distance Ambulated (ft) 20 ft  Mobility Response Tolerated well  Mobility performed by Mobility specialist;Nurse  $Mobility charge 1 Mobility   Requested by nurse to assist pt to bathroom, as she has not yet been out of bed. Pt didn't require assist for bed mobility or ambulation to toilet. She was asx throughout, VSS.   Mamie Levers Mobility Specialist Mobility Specialist Phone: [phone]

## 2020-06-29 LAB — SURGICAL PATHOLOGY

## 2020-07-01 ENCOUNTER — Telehealth (INDEPENDENT_AMBULATORY_CARE_PROVIDER_SITE_OTHER): Payer: Self-pay | Admitting: Thoracic Surgery (Cardiothoracic Vascular Surgery)

## 2020-07-01 ENCOUNTER — Other Ambulatory Visit: Payer: Self-pay

## 2020-07-01 DIAGNOSIS — Z9889 Other specified postprocedural states: Secondary | ICD-10-CM

## 2020-07-01 NOTE — Progress Notes (Signed)
     [address]       [name] [address]             [phone]       Patient: Home Provider: Office Consent for Telemedicine visit obtained.  Today's visit was completed via a real-time telehealth (see specific modality noted below). The patient/authorized person provided oral consent at the time of the visit to engage in a telemedicine encounter with the present provider at [facility]. The patient/authorized person was informed of the potential benefits, limitations, and risks of telemedicine. The patient/authorized person expressed understanding that the laws that protect confidentiality also apply to telemedicine. The patient/authorized person acknowledged understanding that telemedicine does not provide emergency services and that he or she would need to call 911 or proceed to the nearest hospital for help if such a need arose.  . Total time spent in the clinical discussion 5 minutes. . Telehealth Modality: Phone visit (audio only)  I had a telephone visit with the patient and her father.  Overall she is doing well.  We discussed her pathology results, and I explained to her that this is most likely consistent with sarcoidosis.  Dr. Tonia Brooms will be contacted, and follow-up will be set up with him as well.  I will see her back in 1 month with a chest x-ray.

## 2020-07-04 DIAGNOSIS — F4323 Adjustment disorder with mixed anxiety and depressed mood: Secondary | ICD-10-CM | POA: Diagnosis not present

## 2020-07-06 ENCOUNTER — Other Ambulatory Visit: Payer: Self-pay

## 2020-07-06 ENCOUNTER — Ambulatory Visit (INDEPENDENT_AMBULATORY_CARE_PROVIDER_SITE_OTHER): Payer: Self-pay | Admitting: *Deleted

## 2020-07-06 DIAGNOSIS — Z4802 Encounter for removal of sutures: Secondary | ICD-10-CM

## 2020-07-06 DIAGNOSIS — Z9889 Other specified postprocedural states: Secondary | ICD-10-CM

## 2020-07-06 NOTE — Progress Notes (Signed)
Patient arrived for nurse visit to remove sutures post-RATs lymph node dissection 3/28 by Dr. Cliffton Asters. One suture removed with no signs or symptoms of infection noted.  Incision well approximated. Patient tolerated suture removal well.  Patient instructed to keep the incision site clean and dry. Patient acknowledged instructions given.  Patient stated she does not have a f/u with Dr. Cliffton Asters scheduled at this time. Per virtual visit note, Dr. Cliffton Asters wishes to see patient back in follow up in one month. Appt made for patient for March 6th with chest xray. All questions answered.

## 2020-07-08 ENCOUNTER — Ambulatory Visit: Payer: BC Managed Care – PPO | Admitting: Thoracic Surgery (Cardiothoracic Vascular Surgery)

## 2020-07-08 ENCOUNTER — Telehealth: Payer: Self-pay | Admitting: Pulmonary Disease

## 2020-07-08 NOTE — Telephone Encounter (Signed)
Pt saw Dr. Francine Graven for a consult 12/18/19.  Dr. Francine Graven, please advise if pt is to schedule a f/u appt with our office. Pt said that she recently did have surgery after she was referred to cardiothoracic.

## 2020-07-09 NOTE — Telephone Encounter (Signed)
Yes, patient should follow up with me after her recent biopsies with Cardiothoracic surgery. I left a VM that we will be scheduling her for follow up.   Thanks, Cletis Athens

## 2020-07-11 DIAGNOSIS — F4323 Adjustment disorder with mixed anxiety and depressed mood: Secondary | ICD-10-CM | POA: Diagnosis not present

## 2020-07-11 NOTE — Telephone Encounter (Signed)
Pt does have a f/u scheduled with Dr. Tonia Brooms 5/11. Nothing further needed.

## 2020-07-11 NOTE — Telephone Encounter (Signed)
ATC x1. Left vm to return call to schedule patient for f/u visit.

## 2020-07-14 ENCOUNTER — Other Ambulatory Visit: Payer: Self-pay

## 2020-07-14 ENCOUNTER — Ambulatory Visit (INDEPENDENT_AMBULATORY_CARE_PROVIDER_SITE_OTHER): Payer: BC Managed Care – PPO | Admitting: Physician Assistant

## 2020-07-14 VITALS — BP 118/81 | HR 94 | Temp 98.3°F | Ht 67.0 in | Wt 365.0 lb

## 2020-07-14 DIAGNOSIS — Z9189 Other specified personal risk factors, not elsewhere classified: Secondary | ICD-10-CM | POA: Diagnosis not present

## 2020-07-14 DIAGNOSIS — E559 Vitamin D deficiency, unspecified: Secondary | ICD-10-CM | POA: Diagnosis not present

## 2020-07-14 DIAGNOSIS — R739 Hyperglycemia, unspecified: Secondary | ICD-10-CM

## 2020-07-14 DIAGNOSIS — E039 Hypothyroidism, unspecified: Secondary | ICD-10-CM | POA: Diagnosis not present

## 2020-07-14 DIAGNOSIS — Z6841 Body Mass Index (BMI) 40.0 and over, adult: Secondary | ICD-10-CM

## 2020-07-14 DIAGNOSIS — E785 Hyperlipidemia, unspecified: Secondary | ICD-10-CM

## 2020-07-18 DIAGNOSIS — F4323 Adjustment disorder with mixed anxiety and depressed mood: Secondary | ICD-10-CM | POA: Diagnosis not present

## 2020-07-18 NOTE — Progress Notes (Signed)
Chief Complaint:   OBESITY Margaret Golden is here to discuss her progress with her obesity treatment plan along with follow-up of her obesity related diagnoses. Margaret Golden is on the Category 3 Plan and states she is following her eating plan approximately 0% of the time. Margaret Golden states she is not currently exercising.  Today's visit was #: 10 Starting weight: 367 lbs Starting date: 09/03/2019 Today's weight: 365 lbs Today's date: 07/14/2020 Total lbs lost to date: 2 Total lbs lost since last in-office visit: 3  Interim History: Margaret Golden is eating more home cooked meals ans drinking more water. She was just diagnosed with sarcoidosis and will be seeing a pulmonologist in 2 weeks.   Subjective:   1. Hypothyroidism, unspecified type Margaret Golden is on levothyroxine. She reports no excessive fatigue or hair loss.  2. Hyperglycemia Margaret Golden is not on meds and she denies polyphagia. Pt due for labs.  3. Vitamin D deficiency Margaret Golden is on Vit D twice a week and tolerating it well.  4. Dyslipidemia Margaret Golden is not on meds. She denies chest pain. Pt is due for labs.  5. At risk for heart disease Margaret Golden is at a higher than average risk for cardiovascular disease due to obesity.   Assessment/Plan:   1. Hypothyroidism, unspecified type Patient with long-standing hypothyroidism, on levothyroxine therapy. She appears euthyroid. Orders and follow up as documented in patient record. Check labs today.  Counseling . Good thyroid control is important for overall health. Supratherapeutic thyroid levels are dangerous and will not improve weight loss results. . The correct way to take levothyroxine is fasting, with water, separated by at least 30 minutes from breakfast, and separated by more than 4 hours from calcium, iron, multivitamins, acid reflux medications (PPIs).   - Thyroid Panel With TSH  2. Hyperglycemia Fasting labs will be obtained and results with be discussed with Margaret Golden in 2 weeks at her follow up  visit. In the meanwhile Margaret Golden was started on a lower simple carbohydrate diet and will work on weight loss efforts. Check labs today.  - Comprehensive metabolic panel - Hemoglobin A1c - Insulin, random  3. Vitamin D deficiency Low Vitamin D level contributes to fatigue and are associated with obesity, breast, and colon cancer. She agrees to continue to take prescription Vitamin D @50 ,000 IU every week and will follow-up for routine testing of Vitamin D, at least 2-3 times per year to avoid over-replacement. Check labs today.  - VITAMIN D 25 Hydroxy (Vit-D Deficiency, Fractures)  4. Dyslipidemia Cardiovascular risk and specific lipid/LDL goals reviewed.  We discussed several lifestyle modifications today and Margaret Golden will continue to work on diet, exercise and weight loss efforts. Orders and follow up as documented in patient record. Check labs today.  Counseling Intensive lifestyle modifications are the first line treatment for this issue. . Dietary changes: Increase soluble fiber. Decrease simple carbohydrates. . Exercise changes: Moderate to vigorous-intensity aerobic activity 150 minutes per week if tolerated. . Lipid-lowering medications: see documented in medical record.  - Lipid panel  5. At risk for heart disease Margaret Golden was given approximately 15 minutes of coronary artery disease prevention counseling today. She is 30 y.o. female and has risk factors for heart disease including obesity. We discussed intensive lifestyle modifications today with an emphasis on specific weight loss instructions and strategies.   Repetitive spaced learning was employed today to elicit superior memory formation and behavioral change.  6. Class 3 severe obesity with serious comorbidity and body mass index (BMI) of 50.0 to 59.9  in adult, unspecified obesity type (HCC) Margaret Golden is currently in the action stage of change. As such, her goal is to continue with weight loss efforts. She has agreed to the  Category 3 Plan.   Exercise goals: No exercise has been prescribed at this time.  Behavioral modification strategies: no skipping meals and meal planning and cooking strategies.  Margaret Golden has agreed to follow-up with our clinic in 2-3 weeks. She was informed of the importance of frequent follow-up visits to maximize her success with intensive lifestyle modifications for her multiple health conditions.   Margaret Golden was informed we would discuss her lab results at her next visit unless there is a critical issue that needs to be addressed sooner. Margaret Golden agreed to keep her next visit at the agreed upon time to discuss these results.  Objective:   Blood pressure 118/81, pulse 94, temperature 98.3 F (36.8 C), height 5\' 7"  (1.702 m), weight (!) 365 lb (165.6 kg), last menstrual period 06/14/2020, SpO2 97 %. Body mass index is 57.17 kg/m.  General: Cooperative, alert, well developed, in no acute distress. HEENT: Conjunctivae and lids unremarkable. Cardiovascular: Regular rhythm.  Lungs: Normal work of breathing. Neurologic: No focal deficits.   Lab Results  Component Value Date   CREATININE 0.84 06/28/2020   BUN 9 06/28/2020   NA 134 (L) 06/28/2020   K 4.5 06/28/2020   CL 105 06/28/2020   CO2 21 (L) 06/28/2020   Lab Results  Component Value Date   ALT 28 06/23/2020   AST 46 (H) 06/23/2020   ALKPHOS 74 06/23/2020   BILITOT 0.8 06/23/2020   Lab Results  Component Value Date   HGBA1C 4.9 09/03/2019   HGBA1C 5.0 07/30/2019   HGBA1C 4.6 07/06/2016   Lab Results  Component Value Date   INSULIN 29.2 (H) 09/03/2019   Lab Results  Component Value Date   TSH 0.66 11/20/2019   Lab Results  Component Value Date   CHOL 150 09/03/2019   HDL 39 (L) 09/03/2019   LDLCALC 89 09/03/2019   TRIG 120 09/03/2019   CHOLHDL 4 07/21/2019   Lab Results  Component Value Date   WBC 12.4 (H) 06/28/2020   HGB 12.9 06/28/2020   HCT 37.4 06/28/2020   MCV 84.2 06/28/2020   PLT 177 06/28/2020    Lab Results  Component Value Date   IRON 54 04/26/2020   TIBC 370 04/26/2020   FERRITIN 66 04/26/2020     Attestation Statements:   Reviewed by clinician on day of visit: allergies, medications, problem list, medical history, surgical history, family history, social history, and previous encounter notes.  04/28/2020, am acting as Edmund Hilda for Energy manager, PA-C.  I have reviewed the above documentation for accuracy and completeness, and I agree with the above. [employer], PA-C

## 2020-07-21 ENCOUNTER — Ambulatory Visit: Payer: BC Managed Care – PPO | Admitting: Family Medicine

## 2020-07-21 ENCOUNTER — Other Ambulatory Visit: Payer: Self-pay

## 2020-07-21 VITALS — BP 122/86 | Ht 67.0 in | Wt 375.0 lb

## 2020-07-21 DIAGNOSIS — M19072 Primary osteoarthritis, left ankle and foot: Secondary | ICD-10-CM

## 2020-07-21 NOTE — Progress Notes (Signed)
Medication Samples have been provided to the patient.  Drug name: pennsaid       Strength: 2%        Qty: 2 boxes  LOT: [id number]  Exp.Date: 6/23  Dosing instructions: use a pea size amount  The patient has been instructed regarding the correct time, dose, and frequency of taking this medication, including desired effects and most common side effects.   Margaret Golden 3:33 PM 07/21/2020

## 2020-07-21 NOTE — Patient Instructions (Signed)
Good to see you Please try ice as needed  Please try the brace  Please send me a message in MyChart with any questions or updates.  Please see [place] back as needed.   --Dr. Jordan Likes

## 2020-07-21 NOTE — Progress Notes (Signed)
Margaret Golden - 30 y.o. female MRN [id number]  Date of birth: 03-Aug-1990  SUBJECTIVE:  Including CC & ROS.  No chief complaint on file.   Margaret Golden is a 30 y.o. female that is presenting with acute left ankle pain.  It feels similar to her previous pain.  It is occurring laterally.  No injury or inciting event in the meantime.   Review of Systems See HPI   HISTORY: Past Medical, Surgical, Social, and Family History Reviewed & Updated per EMR.   Pertinent Historical Findings include:  Past Medical History:  Diagnosis Date  . Acid reflux   . ADD (attention deficit disorder)   . Anemia   . Anxiety   . Arthritis    left ankle  . Back pain   . Chest pain   . Complication of anesthesia   . Depression   . Dyspnea    with exertion  . Exercise-induced asthma    as a child  . Fatty liver   . History of blood transfusion   . History of chicken pox   . History of kidney stones    passed   . Hypothyroidism   . Joint pain   . Lower extremity edema   . Major depressive disorder   . Migraines    chronic  . Palpitations   . Pneumonia    x 2  . PONV (postoperative nausea and vomiting)    nausea  . RLS (restless legs syndrome)   . Sleep apnea 2021  . Thyroid disease    hypothyroid    Past Surgical History:  Procedure Laterality Date  . BRONCHIAL NEEDLE ASPIRATION BIOPSY  03/31/2020   Procedure: BRONCHIAL NEEDLE ASPIRATION BIOPSIES;  Surgeon: Josephine Igo, DO;  Location: [facility];  Service: Pulmonary;;  . BRONCHIAL WASHINGS  03/31/2020   Procedure: BRONCHIAL WASHINGS;  Surgeon: Josephine Igo, DO;  Location: [facility];  Service: Pulmonary;;  . DILATATION & CURETTAGE/HYSTEROSCOPY WITH MYOSURE N/A 03/22/2020   Procedure: DILATATION & CURETTAGE/HYSTEROSCOPY WITH MYOSURE;  Surgeon: Romualdo Bolk, MD;  Location: [facility];  Service: Gynecology;  Laterality: N/A;  . INTERCOSTAL NERVE BLOCK Right 06/27/2020   Procedure: INTERCOSTAL NERVE BLOCK;  Surgeon:  Corliss Skains, MD;  Location: [facility] OR;  Service: Thoracic;  Laterality: Right;  . INTRAUTERINE DEVICE (IUD) INSERTION N/A 03/22/2020   Procedure: INTRAUTERINE DEVICE (IUD) INSERTION;  Surgeon: Romualdo Bolk, MD;  Location: [facility] OR;  Service: Gynecology;  Laterality: N/A;  Mirena IUD insertion  . KNEE ARTHROSCOPY Right 2007  . LYMPH NODE BIOPSY Right 06/27/2020   Procedure: LYMPH NODE BIOPSY;  Surgeon: Corliss Skains, MD;  Location: [facility] OR;  Service: Thoracic;  Laterality: Right;  . TONSILLECTOMY  2010  . TONSILLECTOMY    . VIDEO BRONCHOSCOPY WITH ENDOBRONCHIAL ULTRASOUND N/A 03/31/2020   Procedure: VIDEO BRONCHOSCOPY WITH ENDOBRONCHIAL ULTRASOUND;  Surgeon: Josephine Igo, DO;  Location: [facility];  Service: Pulmonary;  Laterality: N/A;  . WISDOM TOOTH EXTRACTION    . WISDOM TOOTH EXTRACTION Bilateral    upper and lower    Family History  Problem Relation Age of Onset  . Depression Mother   . Obesity Mother   . Depression Father   . Sleep apnea Father   . Obesity Father   . Cancer Paternal Aunt        colon  . Cancer Paternal Uncle        brain cancer  . Diabetes Maternal Grandfather   . Arthritis Maternal  Grandfather   . Arthritis Maternal Grandmother   . Breast cancer Maternal Grandmother   . Arthritis Paternal Grandmother   . Arthritis Paternal Grandfather   . Migraines Neg Hx     Social History   Socioeconomic History  . Marital status: Single    Spouse name: Not on file  . Number of children: 0  . Years of education: 53  . Highest education level: Not on file  Occupational History  . Occupation: Conservation officer, historic buildings  Tobacco Use  . Smoking status: Never Smoker  . Smokeless tobacco: Never Used  Vaping Use  . Vaping Use: Never used  Substance and Sexual Activity  . Alcohol use: Not Currently    Alcohol/week: 0.0 standard drinks  . Drug use: No  . Sexual activity: Never    Comment: never sexually active  Other Topics Concern  . Not on  file  Social History Narrative   Unemployed   Lives alone --update 11/11/19   Seeking work   Only child   Dog 3   Lives in a one story house with father, has a cat-12/26/16-sjb      Caffeine: 2-4 cups/day   Social Determinants of Corporate investment banker Strain: Not on file  Food Insecurity: Not on file  Transportation Needs: Not on file  Physical Activity: Not on file  Stress: Not on file  Social Connections: Not on file  Intimate Partner Violence: Not on file     PHYSICAL EXAM:  VS: BP 122/86   Ht 5\' 7"  (1.702 m)   Wt (!) 375 lb (170.1 kg)   BMI 58.73 kg/m  Physical Exam Gen: NAD, alert, cooperative with exam, well-appearing MSK:  Left ankle: Tenderness to palpation at the ATFL. Swelling in this area. Normal range of motion. Neurovascular intact     ASSESSMENT & PLAN:   OA (osteoarthritis) of ankle Previous MRI was showing a chondral loss of the ankle joint.  She has intermittent pain from time to time. -Counseled on home exercise therapy and supportive care. -Pennsaid samples. -Can try Aircast. -Could consider injection.

## 2020-07-21 NOTE — Assessment & Plan Note (Signed)
Previous MRI was showing a chondral loss of the ankle joint.  She has intermittent pain from time to time. -Counseled on home exercise therapy and supportive care. -Pennsaid samples. -Can try Aircast. -Could consider injection.

## 2020-07-22 ENCOUNTER — Ambulatory Visit: Payer: BC Managed Care – PPO | Admitting: Psychiatry

## 2020-07-25 DIAGNOSIS — F4323 Adjustment disorder with mixed anxiety and depressed mood: Secondary | ICD-10-CM | POA: Diagnosis not present

## 2020-07-26 ENCOUNTER — Other Ambulatory Visit: Payer: Self-pay

## 2020-07-26 ENCOUNTER — Ambulatory Visit: Payer: BC Managed Care – PPO | Admitting: Pulmonary Disease

## 2020-07-26 ENCOUNTER — Encounter: Payer: Self-pay | Admitting: Pulmonary Disease

## 2020-07-26 ENCOUNTER — Other Ambulatory Visit: Payer: Self-pay | Admitting: Pulmonary Disease

## 2020-07-26 VITALS — BP 134/72 | HR 91 | Temp 97.2°F | Ht 67.0 in | Wt 370.6 lb

## 2020-07-26 DIAGNOSIS — D869 Sarcoidosis, unspecified: Secondary | ICD-10-CM | POA: Diagnosis not present

## 2020-07-26 LAB — COMPREHENSIVE METABOLIC PANEL
ALT: 20 U/L (ref 0–35)
AST: 30 U/L (ref 0–37)
Albumin: 4.2 g/dL (ref 3.5–5.2)
Alkaline Phosphatase: 77 U/L (ref 39–117)
BUN: 10 mg/dL (ref 6–23)
CO2: 29 mEq/L (ref 19–32)
Calcium: 9.3 mg/dL (ref 8.4–10.5)
Chloride: 103 mEq/L (ref 96–112)
Creatinine, Ser: 0.88 mg/dL (ref 0.40–1.20)
GFR: 88.21 mL/min (ref >60.00)
Glucose, Bld: 84 mg/dL (ref 70–99)
Potassium: 4.2 mEq/L (ref 3.5–5.1)
Sodium: 138 mEq/L (ref 135–145)
Total Bilirubin: 0.6 mg/dL (ref 0.2–1.2)
Total Protein: 7.6 g/dL (ref 6.0–8.3)

## 2020-07-26 NOTE — Patient Instructions (Addendum)
Your lymph node biopsy results show sarcoidosis.   We will check your labs today and if they are normal we will start methotrexate therapy.   Start methotrexate 2.5mg  2 tablets weekly - take medication the same day each week.  - after 2 weeks, if not experiencing side effects then increase to 4 tablets per week.   Methotrexate is a teratogen, please consider a form of birth control when sexually active.

## 2020-07-26 NOTE — Progress Notes (Signed)
Synopsis: Referred in 12/2019 by Debbrah Alar, NP for mediastinal adenopathy  Subjective:   PATIENT ID: Margaret Golden GENDER: female DOB: [birth year], MRN: [id number]   HPI  Chief Complaint  Patient presents with  . Follow-up    F/U for SOB. States she is still having issues with SOB with exertion. She has noticed an increase in chest pressure in the middle of her chest. Denies using any O2. States the albuterol is not helping with symptoms.    Margaret Golden is a 30 year old woman, non-smoker with history of anemia, hypertension and obesity who returns to pulmonary clinic for chest discomfort, shortness of breath and mediastinal lymphadenopathy.   She developed chest discomfort and shortness of breath about 1 year ago. She was hospitalized for symptomatic anemia due to heavy menses. Her hemoglobin has remained in the normal range since but she continued to have the above symptoms. She was evaluated by Cardiology prior to hysteroscopy who performed an ECHO which was normal and a CTA coronary scan which showed mediastinal adenopathy and pulmonary nodules.   We have followed her thoracic adenopathy with serial CT chest scans which demonstrated progression of the adenopathy prompting a PET scan on 04/27/20 which demonstrated increased uptake in her lymph nodes and also the right upper lobe nodule. She was taken for EBUS on 03/31/20 with sampling of the station 7 lymph node with non-diagnostic results. She was then referred to Thoracic Surgery who performed mediastinoscopy on 06/27/20 with lymph node samples  with non-caseating granulomas consistent with sarcoidosis.     She denies any vision changes, joint pains or skin rashes. She continues to have exertional dyspnea, chest pressure and has started to wake up short of breath at night at times.   No family members with sarcoidosis.   Past Medical History:  Diagnosis Date  . Acid reflux   . ADD (attention deficit disorder)   . Anemia    . Anxiety   . Arthritis    left ankle  . Back pain   . Chest pain   . Complication of anesthesia   . Depression   . Dyspnea    with exertion  . Exercise-induced asthma    as a child  . Fatty liver   . History of blood transfusion   . History of chicken pox   . History of kidney stones    passed   . Hypothyroidism   . Joint pain   . Lower extremity edema   . Major depressive disorder   . Migraines    chronic  . Palpitations   . Pneumonia    x 2  . PONV (postoperative nausea and vomiting)    nausea  . RLS (restless legs syndrome)   . Sleep apnea 2021  . Thyroid disease    hypothyroid     Family History  Problem Relation Age of Onset  . Depression Mother   . Obesity Mother   . Depression Father   . Sleep apnea Father   . Obesity Father   . Cancer Paternal Aunt        colon  . Cancer Paternal Uncle        brain cancer  . Diabetes Maternal Grandfather   . Arthritis Maternal Grandfather   . Arthritis Maternal Grandmother   . Breast cancer Maternal Grandmother   . Arthritis Paternal Grandmother   . Arthritis Paternal Grandfather   . Migraines Neg Hx      Social History   Socioeconomic History  .  Marital status: Single    Spouse name: Not on file  . Number of children: 0  . Years of education: 48  . Highest education level: Not on file  Occupational History  . Occupation: IT sales professional  Tobacco Use  . Smoking status: Never Smoker  . Smokeless tobacco: Never Used  Vaping Use  . Vaping Use: Never used  Substance and Sexual Activity  . Alcohol use: Not Currently    Alcohol/week: 0.0 standard drinks  . Drug use: No  . Sexual activity: Never    Comment: never sexually active  Other Topics Concern  . Not on file  Social History Narrative   Unemployed   Lives alone --update 11/11/19   Seeking work   Only child   Dog 3   Lives in a one story house with father, has a cat-12/26/16-sjb      Caffeine: 2-4 cups/day   Social Determinants of  Health   Financial Resource Strain: Not on file  Food Insecurity: Not on file  Transportation Needs: Not on file  Physical Activity: Not on file  Stress: Not on file  Social Connections: Not on file  Intimate Partner Violence: Not on file     Allergies  Allergen Reactions  . Augmentin [Amoxicillin-Pot Clavulanate] Hives     Outpatient Medications Prior to Visit  Medication Sig Dispense Refill  . albuterol (VENTOLIN HFA) 108 (90 Base) MCG/ACT inhaler Inhale 2 puffs into the lungs every 6 (six) hours as needed for wheezing or shortness of breath (Chest tightness). 18 g 0  . Armodafinil 150 MG tablet Take 1 tablet (150 mg total) by mouth daily. 30 tablet 2  . aspirin-acetaminophen-caffeine (EXCEDRIN MIGRAINE) 203-559-74 MG tablet Take 2 tablets by mouth daily as needed for headache or migraine.    . Iron-FA-B Cmp-C-Biot-Probiotic (FUSION PLUS) CAPS One capsule twice daily with orange juice. Stop your Ferrous sulfate. (Patient taking differently: Take 1 capsule by mouth 2 (two) times daily. with orange juice. Stop your Ferrous sulfate.) 30 capsule 1  . levothyroxine (SYNTHROID) 100 MCG tablet Take 100 mcg by mouth daily before breakfast. Take with the 112 mcg dose to equal 212 mcg daily    . levothyroxine (SYNTHROID) 112 MCG tablet Take 112 mcg by mouth daily before breakfast. Take with the 100 mcg dose to equal 212 mcg daily    . LORazepam (ATIVAN) 0.5 MG tablet Take 1 tablet (0.5 mg total) by mouth every 8 (eight) hours as needed for anxiety. 30 tablet 1  . Multiple Vitamins-Minerals (MULTIVITAMIN WITH MINERALS) tablet Take 1 tablet by mouth daily.    Marland Kitchen omeprazole (PRILOSEC) 20 MG capsule Take 2 capsules (40 mg total) by mouth daily. 180 capsule 2  . potassium chloride SA (KLOR-CON) 20 MEQ tablet Take 1 tablet (20 mEq total) by mouth 2 (two) times a week. 26 tablet 0  . propranolol (INDERAL) 20 MG tablet Take 1 tablet (20 mg total) by mouth 3 (three) times daily. 270 tablet 3  . sertraline  (ZOLOFT) 100 MG tablet Take 2 tablets (200 mg total) by mouth daily. 180 tablet 1  . topiramate (TOPAMAX) 50 MG tablet Start with 35m(1 tab) at bedtime. In 1 week increase to 1064m2 tabs) at bedtime. (Patient taking differently: Take 100 mg by mouth at bedtime.) 180 tablet 6  . traMADol (ULTRAM) 50 MG tablet Take 1-2 tablets (50-100 mg total) by mouth every 6 (six) hours as needed (mild pain). 30 tablet 0  . traZODone (DESYREL) 50 MG tablet Take 1/2-2 po  QHS prn insomnia (Patient taking differently: Take 25-100 mg by mouth at bedtime as needed for sleep.) 60 tablet 1  . Vitamin D, Ergocalciferol, (DRISDOL) 1.25 MG (50000 UNIT) CAPS capsule Take 1 capsule (50,000 Units total) by mouth 2 (two) times a week. (Patient taking differently: Take 50,000 Units by mouth 2 (two) times a week. Monday, Thursday) 10 capsule 0  . furosemide (LASIX) 20 MG tablet Take 1 tablet (20 mg total) by mouth 2 (two) times a week. 26 tablet 0  . lamoTRIgine (LAMICTAL) 200 MG tablet Take 1 tablet (200 mg total) by mouth daily. 90 tablet 0   No facility-administered medications prior to visit.    Review of Systems  Constitutional: Positive for malaise/fatigue. Negative for chills, fever and weight loss.  HENT: Negative for congestion and sore throat.   Respiratory: Positive for shortness of breath. Negative for cough, hemoptysis, sputum production and wheezing.   Cardiovascular: Positive for chest pain. Negative for palpitations, orthopnea, claudication, leg swelling and PND.  Gastrointestinal: Negative for abdominal pain, heartburn and vomiting.  Genitourinary: Negative.   Musculoskeletal: Negative for joint pain and myalgias.  Neurological: Negative for dizziness, focal weakness and headaches.  Endo/Heme/Allergies: Negative.   Psychiatric/Behavioral: Negative.    Objective:   Vitals:   07/26/20 1347  BP: 134/72  Pulse: 91  Temp: (!) 97.2 F (36.2 C)  TempSrc: Temporal  SpO2: 98%  Weight: (!) 370 lb 9.6 oz  (168.1 kg)  Height: '5\' 7"'  (1.702 m)     Physical Exam Constitutional:      General: She is not in acute distress.    Appearance: She is obese. She is not ill-appearing.  HENT:     Head: Normocephalic and atraumatic.  Eyes:     General: No scleral icterus.    Conjunctiva/sclera: Conjunctivae normal.     Pupils: Pupils are equal, round, and reactive to light.  Cardiovascular:     Rate and Rhythm: Normal rate and regular rhythm.     Pulses: Normal pulses.     Heart sounds: Normal heart sounds. No murmur heard.   Pulmonary:     Effort: Pulmonary effort is normal.     Breath sounds: Decreased breath sounds present. No wheezing, rhonchi or rales.  Abdominal:     General: Bowel sounds are normal.     Palpations: Abdomen is soft.  Musculoskeletal:     Right lower leg: No edema.     Left lower leg: No edema.  Lymphadenopathy:     Cervical: No cervical adenopathy.  Skin:    General: Skin is warm and dry.  Neurological:     General: No focal deficit present.     Mental Status: She is alert.  Psychiatric:        Mood and Affect: Mood normal.        Behavior: Behavior normal.        Thought Content: Thought content normal.        Judgment: Judgment normal.    CBC    Component Value Date/Time   WBC 12.4 (H) 06/28/2020 0703   RBC 4.44 06/28/2020 0703   HGB 12.9 06/28/2020 0703   HGB 13.4 04/26/2020 1318   HGB 9.3 (L) 08/26/2019 1045   HCT 37.4 06/28/2020 0703   HCT 33.4 (L) 08/26/2019 1045   PLT 177 06/28/2020 0703   PLT 219 04/26/2020 1318   PLT 234 08/26/2019 1045   MCV 84.2 06/28/2020 0703   MCV 72 (L) 08/26/2019 1045   MCH 29.1 06/28/2020  0703   MCHC 34.5 06/28/2020 0703   RDW 14.5 06/28/2020 0703   RDW 15.4 08/26/2019 1045   LYMPHSABS 1.7 04/26/2020 1318   MONOABS 0.5 04/26/2020 1318   EOSABS 0.2 04/26/2020 1318   BASOSABS 0.0 04/26/2020 1318   BMP Latest Ref Rng & Units 07/26/2020 06/28/2020 06/23/2020  Glucose 70 - 99 mg/dL 84 108(H) 124(H)  BUN 6 - 23 mg/dL  '10 9 9  ' Creatinine 0.40 - 1.20 mg/dL 0.88 0.84 0.77  Sodium 135 - 145 mEq/L 138 134(L) 136  Potassium 3.5 - 5.1 mEq/L 4.2 4.5 3.8  Chloride 96 - 112 mEq/L 103 105 103  CO2 19 - 32 mEq/L 29 21(L) 24  Calcium 8.4 - 10.5 mg/dL 9.3 8.2(L) 8.7(L)   Chest imaging: PET Scan 04/27/20 Multifocal hypermetabolic nodal activity throughout the mediastinum, hilar regions and porta hepatis. Multiple nonspecific small pulmonary nodules are again noted bilaterally, stable from recent studies, but also new from baseline exam. The largest nodule in the right upper lobe is hypermetabolic  CT Chest w contrast 03/14/20 Mild interval progression of thoracic adenopathy. Numerous pulmonary nodules that remain stable  CTA 12/17/19 Mediastinal and hilar adenopathy similar to scan on 11/23/19 Stable scattered pulmonary nodules  CTA Coronary 11/23/19 Enlarged subcarinal node. Bilateral hilar and infrahilar boderlinr to mild adenopathy. Bilateral pulmonary nodules, subpleural distribution.   CTA Chest 06/29/19 Mediastinum/Nodes: No enlarged mediastinal, hilar, or axillary lymph nodes. Thyroid gland, trachea, and esophagus demonstrate no significant findings. Lungs/Pleura: No acute pleural or parenchymal lung disease. Central airways are widely patent.  PFT: No flowsheet data found.  Labs: 07/26/20 - Hep B & C negative  Path: 06/27/20 - Surgical Path: LN 7, 10R show well formed granulomas concerning for sarcoidosis. Flow pathology negative.  Echo 09/14/19: EF 60-65%, Normal LV function. RV systolic function is normal. No valvular abnormalities.  Sleep Study: 01/2016 - AHI 3.5/hr -No central sleep apneas - Minimal to no oxygen desaturations    Assessment & Plan:   Sarcoidosis - Plan: Hepatitis panel, acute, Comp Met (CMET), methotrexate (RHEUMATREX) 2.5 MG tablet, CANCELED: COMPLETE METABOLIC PANEL WITH GFR  Discussion: Chelby Salata is a 30 year old woman, non-smoker with history of anemia,  hypertension and obesity who returns to pulmonary clinic for chest discomfort, shortness of breath and mediastinal lymphadenopathy found to have sarcoidosis via mediastinoscopy on 06/27/20.   We discussed treatment options via prednisone and methotrexate. Given her history of obesity and difficulty with weight loss we have recommended starting treatment with methotrexate. Her CMP and CBC are within normal limits and her hepatitis panel is negative. We discussed methotrexate is a teratogen and that she should seek birth control therapy if sexually active.  She will start methotrexate 3m (2 tabs) weekly and after 2 weeks to increase to 4 tabs weekly if not experiencing any side effects.   We will have her back in clinic in 2 months for follow up lab work and to continue titrating her methotrexate to a goal of 140mweekly. We will obtain pulmonary function tests at follow up as well.   JoFreda JacksonMD [facility] Office: [phone]  Current Outpatient Medications:  .  albuterol (VENTOLIN HFA) 108 (90 Base) MCG/ACT inhaler, Inhale 2 puffs into the lungs every 6 (six) hours as needed for wheezing or shortness of breath (Chest tightness)., Disp: 18 g, Rfl: 0 .  Armodafinil 150 MG tablet, Take 1 tablet (150 mg total) by mouth daily., Disp: 30 tablet, Rfl: 2 .  aspirin-acetaminophen-caffeine (EXCEDRIN MIGRAINE) 250-250-65 MG tablet, Take 2 tablets by mouth daily as needed for headache or migraine., Disp: , Rfl:  .  Iron-FA-B Cmp-C-Biot-Probiotic (FUSION PLUS) CAPS, One capsule twice daily with orange juice. Stop your Ferrous sulfate. (Patient taking differently: Take 1 capsule by mouth 2 (two) times daily. with orange juice. Stop your Ferrous sulfate.), Disp: 30 capsule, Rfl: 1 .  levothyroxine (SYNTHROID) 100 MCG tablet, Take 100 mcg by mouth daily before breakfast. Take with the 112 mcg dose to equal 212 mcg daily, Disp: , Rfl:  .  levothyroxine (SYNTHROID) 112 MCG tablet,  Take 112 mcg by mouth daily before breakfast. Take with the 100 mcg dose to equal 212 mcg daily, Disp: , Rfl:  .  LORazepam (ATIVAN) 0.5 MG tablet, Take 1 tablet (0.5 mg total) by mouth every 8 (eight) hours as needed for anxiety., Disp: 30 tablet, Rfl: 1 .  methotrexate (RHEUMATREX) 2.5 MG tablet, Take 2 tablets (5 mg total) by mouth once a week. Caution:Chemotherapy. Protect from light., Disp: 16 tablet, Rfl: 2 .  Multiple Vitamins-Minerals (MULTIVITAMIN WITH MINERALS) tablet, Take 1 tablet by mouth daily., Disp: , Rfl:  .  omeprazole (PRILOSEC) 20 MG capsule, Take 2 capsules (40 mg total) by mouth daily., Disp: 180 capsule, Rfl: 2 .  potassium chloride SA (KLOR-CON) 20 MEQ tablet, Take 1 tablet (20 mEq total) by mouth 2 (two) times a week., Disp: 26 tablet, Rfl: 0 .  propranolol (INDERAL) 20 MG tablet, Take 1 tablet (20 mg total) by mouth 3 (three) times daily., Disp: 270 tablet, Rfl: 3 .  sertraline (ZOLOFT) 100 MG tablet, Take 2 tablets (200 mg total) by mouth daily., Disp: 180 tablet, Rfl: 1 .  topiramate (TOPAMAX) 50 MG tablet, Start with 23m(1 tab) at bedtime. In 1 week increase to 1022m2 tabs) at bedtime. (Patient taking differently: Take 100 mg by mouth at bedtime.), Disp: 180 tablet, Rfl: 6 .  traMADol (ULTRAM) 50 MG tablet, Take 1-2 tablets (50-100 mg total) by mouth every 6 (six) hours as needed (mild pain)., Disp: 30 tablet, Rfl: 0 .  traZODone (DESYREL) 50 MG tablet, Take 1/2-2 po QHS prn insomnia (Patient taking differently: Take 25-100 mg by mouth at bedtime as needed for sleep.), Disp: 60 tablet, Rfl: 1 .  furosemide (LASIX) 20 MG tablet, Take 1 tablet (20 mg total) by mouth 2 (two) times a week., Disp: 26 tablet, Rfl: 0 .  lamoTRIgine (LAMICTAL) 200 MG tablet, Take 1 tablet (200 mg total) by mouth daily., Disp: 90 tablet, Rfl: 0 .  Vitamin D, Ergocalciferol, (DRISDOL) 1.25 MG (50000 UNIT) CAPS capsule, Take 1 capsule (50,000 Units total) by mouth 2 (two) times a week., Disp: 10  capsule, Rfl: 0

## 2020-07-27 LAB — HEPATITIS PANEL, ACUTE
Hep A IgM: NONREACTIVE
Hep B C IgM: NONREACTIVE
Hepatitis B Surface Ag: NONREACTIVE
Hepatitis C Ab: NONREACTIVE
SIGNAL TO CUT-OFF: 0.02 (ref <1.00)

## 2020-07-27 MED ORDER — METHOTREXATE 2.5 MG PO TABS: 5.0000 mg | ORAL_TABLET | ORAL | 2 refills | Status: DC

## 2020-08-03 ENCOUNTER — Ambulatory Visit (INDEPENDENT_AMBULATORY_CARE_PROVIDER_SITE_OTHER): Payer: BC Managed Care – PPO | Admitting: Physician Assistant

## 2020-08-03 DIAGNOSIS — F4323 Adjustment disorder with mixed anxiety and depressed mood: Secondary | ICD-10-CM | POA: Diagnosis not present

## 2020-08-04 ENCOUNTER — Other Ambulatory Visit: Payer: Self-pay | Admitting: Thoracic Surgery (Cardiothoracic Vascular Surgery)

## 2020-08-04 ENCOUNTER — Encounter (INDEPENDENT_AMBULATORY_CARE_PROVIDER_SITE_OTHER): Payer: Self-pay | Admitting: Physician Assistant

## 2020-08-04 ENCOUNTER — Ambulatory Visit (INDEPENDENT_AMBULATORY_CARE_PROVIDER_SITE_OTHER): Payer: BC Managed Care – PPO | Admitting: Physician Assistant

## 2020-08-04 ENCOUNTER — Other Ambulatory Visit: Payer: Self-pay

## 2020-08-04 VITALS — BP 102/71 | HR 83 | Temp 97.6°F | Ht 67.0 in | Wt 362.0 lb

## 2020-08-04 DIAGNOSIS — Z9189 Other specified personal risk factors, not elsewhere classified: Secondary | ICD-10-CM | POA: Diagnosis not present

## 2020-08-04 DIAGNOSIS — E7849 Other hyperlipidemia: Secondary | ICD-10-CM

## 2020-08-04 DIAGNOSIS — Z9889 Other specified postprocedural states: Secondary | ICD-10-CM

## 2020-08-04 DIAGNOSIS — E559 Vitamin D deficiency, unspecified: Secondary | ICD-10-CM

## 2020-08-04 DIAGNOSIS — Z6841 Body Mass Index (BMI) 40.0 and over, adult: Secondary | ICD-10-CM

## 2020-08-04 DIAGNOSIS — E785 Hyperlipidemia, unspecified: Secondary | ICD-10-CM

## 2020-08-04 MED ORDER — VITAMIN D (ERGOCALCIFEROL) 1.25 MG (50000 UNIT) PO CAPS: 50000.0000 [IU] | ORAL_CAPSULE | ORAL | 0 refills | Status: DC

## 2020-08-05 ENCOUNTER — Telehealth: Payer: Self-pay | Admitting: *Deleted

## 2020-08-05 ENCOUNTER — Ambulatory Visit
Admission: RE | Admit: 2020-08-05 | Discharge: 2020-08-05 | Disposition: A | Discharge status: Home / Self Care | Payer: BC Managed Care – PPO | Source: Ambulatory Visit | Attending: Thoracic Surgery (Cardiothoracic Vascular Surgery) | Admitting: Thoracic Surgery (Cardiothoracic Vascular Surgery)

## 2020-08-05 ENCOUNTER — Ambulatory Visit (INDEPENDENT_AMBULATORY_CARE_PROVIDER_SITE_OTHER): Payer: Self-pay | Admitting: Physician Assistant

## 2020-08-05 VITALS — BP 140/80 | HR 84 | Resp 20 | Ht 67.0 in | Wt 360.0 lb

## 2020-08-05 DIAGNOSIS — Z9889 Other specified postprocedural states: Secondary | ICD-10-CM

## 2020-08-05 DIAGNOSIS — R079 Chest pain, unspecified: Secondary | ICD-10-CM | POA: Diagnosis not present

## 2020-08-05 DIAGNOSIS — R0602 Shortness of breath: Secondary | ICD-10-CM | POA: Diagnosis not present

## 2020-08-05 DIAGNOSIS — R590 Localized enlarged lymph nodes: Secondary | ICD-10-CM

## 2020-08-05 NOTE — Telephone Encounter (Signed)
LMTCB for the pt  Her PFT order was already placed, just needs to be scheduled for this and covid test Appt already made with Dr Francine Graven for 09/27/20 at 2 pm so try for PFT that day at 1:00 if still open   Forwarding to front desk pool

## 2020-08-05 NOTE — Progress Notes (Addendum)
[address]       [name] [address]             [phone]       Margaret Golden is a 30 y.o. female patient. Ms. Acri underwent a robotic-assisted thorascopy with lysis of adhesions, lymph node biopsies, and intercostal nerve block. Tele-visit with Dr. Cliffton Asters on 4/1. She is still having shortness of breath with light activity and at night.    1. S/P robot-assisted surgical procedure   2. Mediastinal lymphadenopathy    Past Medical History:  Diagnosis Date  . Acid reflux   . ADD (attention deficit disorder)   . Anemia   . Anxiety   . Arthritis    left ankle  . Back pain   . Chest pain   . Complication of anesthesia   . Depression   . Dyspnea    with exertion  . Exercise-induced asthma    as a child  . Fatty liver   . History of blood transfusion   . History of chicken pox   . History of kidney stones    passed   . Hypothyroidism   . Joint pain   . Lower extremity edema   . Major depressive disorder   . Migraines    chronic  . Palpitations   . Pneumonia    x 2  . PONV (postoperative nausea and vomiting)    nausea  . RLS (restless legs syndrome)   . Sleep apnea 2021  . Thyroid disease    hypothyroid   No past surgical history pertinent negatives on file. Scheduled Meds: Current Outpatient Medications on File Prior to Visit  Medication Sig Dispense Refill  . albuterol (VENTOLIN HFA) 108 (90 Base) MCG/ACT inhaler Inhale 2 puffs into the lungs every 6 (six) hours as needed for wheezing or shortness of breath (Chest tightness). 18 g 0  . Armodafinil 150 MG tablet Take 1 tablet (150 mg total) by mouth daily. 30 tablet 2  . aspirin-acetaminophen-caffeine (EXCEDRIN MIGRAINE) 250-250-65 MG tablet Take 2 tablets by mouth daily as needed for headache or migraine.    . furosemide (LASIX) 20 MG tablet Take 1 tablet (20 mg total) by mouth 2 (two) times a week. 26 tablet 0  . Iron-FA-B Cmp-C-Biot-Probiotic (FUSION PLUS) CAPS One capsule  twice daily with orange juice. Stop your Ferrous sulfate. (Patient taking differently: Take 1 capsule by mouth 2 (two) times daily. with orange juice. Stop your Ferrous sulfate.) 30 capsule 1  . lamoTRIgine (LAMICTAL) 200 MG tablet Take 1 tablet (200 mg total) by mouth daily. 90 tablet 0  . levothyroxine (SYNTHROID) 100 MCG tablet Take 100 mcg by mouth daily before breakfast. Take with the 112 mcg dose to equal 212 mcg daily    . levothyroxine (SYNTHROID) 112 MCG tablet Take 112 mcg by mouth daily before breakfast. Take with the 100 mcg dose to equal 212 mcg daily    . LORazepam (ATIVAN) 0.5 MG tablet Take 1 tablet (0.5 mg total) by mouth every 8 (eight) hours as needed for anxiety. 30 tablet 1  . methotrexate (RHEUMATREX) 2.5 MG tablet Take 2 tablets (5 mg total) by mouth once a week. Caution:Chemotherapy. Protect from light. 16 tablet 2  . Multiple Vitamins-Minerals (MULTIVITAMIN WITH MINERALS) tablet Take 1 tablet by mouth daily.    Marland Kitchen omeprazole (PRILOSEC) 20 MG capsule Take 2 capsules (40 mg total) by mouth daily. 180 capsule 2  . potassium chloride SA (KLOR-CON) 20  MEQ tablet Take 1 tablet (20 mEq total) by mouth 2 (two) times a week. 26 tablet 0  . propranolol (INDERAL) 20 MG tablet Take 1 tablet (20 mg total) by mouth 3 (three) times daily. 270 tablet 3  . sertraline (ZOLOFT) 100 MG tablet Take 2 tablets (200 mg total) by mouth daily. 180 tablet 1  . topiramate (TOPAMAX) 50 MG tablet Start with 50mg (1 tab) at bedtime. In 1 week increase to 100mg (2 tabs) at bedtime. (Patient taking differently: Take 100 mg by mouth at bedtime.) 180 tablet 6  . traMADol (ULTRAM) 50 MG tablet Take 1-2 tablets (50-100 mg total) by mouth every 6 (six) hours as needed (mild pain). 30 tablet 0  . traZODone (DESYREL) 50 MG tablet Take 1/2-2 po QHS prn insomnia (Patient taking differently: Take 25-100 mg by mouth at bedtime as needed for sleep.) 60 tablet 1  . Vitamin D, Ergocalciferol, (DRISDOL) 1.25 MG (50000 UNIT)  CAPS capsule Take 1 capsule (50,000 Units total) by mouth 2 (two) times a week. 10 capsule 0   No current facility-administered medications on file prior to visit.    Allergies  Allergen Reactions  . Augmentin [Amoxicillin-Pot Clavulanate] Hives   Active Problems:   * No active hospital problems. *  Blood pressure 140/80, pulse 84, resp. rate 20, height 5\' 7"  (1.702 m), weight (!) 360 lb (163.3 kg), SpO2 99 %.  Subjective She returns to the office for a postop visit 1 month since surgery.  Today, she states that she is still having shortness of breath with slight activity.  She is having some numbness around her incision with the occasional stabbing pains.   Objective   Cor: RRR, no murmur Pulm: CTA bilaterally Abd: normal bowel sounds Ext: 2-3+ nonpitting edema Incisions: c/d/i, healing well   Narrative & Impression  CLINICAL DATA:  Shortness of breath and chest pain status post lymph node biopsy.  EXAM: CHEST - 2 VIEW  COMPARISON:  Chest radiograph 06/28/2020.  FINDINGS: Stable cardiac and mediastinal contours. No consolidative pulmonary opacities. No pleural effusion or pneumothorax. Osseous structures unremarkable.  IMPRESSION: No active cardiopulmonary disease.   Electronically Signed   By: M.D.   On: 08/05/2020 11:41    Assessment & Plan   [name] is status post robotic-assisted thorascopy with lysis of adhesions, lymph node biopsies, and intercostal nerve block.  Today, she states that she is still having shortness of breath with light activity and at night.  She saw pulmonology who suggested following up with a sleep apnea physician.  I have recommended Dr. Annia Belt for her sleep apnea needs.  She did an initial sleep study but never got a CPAP machine.  I also think that she may need a different medication regimen during the day for her shortness of breath.  I will refer to pulmonology to make these changes. She was started on a low dose  of methotrexate for her sarcoidosis.  I do not think that she needs anything else surgically she has time.  However, I will discuss patient with Dr. 10/05/2020.  Her chest x-ray was reviewed which showed no pleural effusion or pneumothorax and there was no active cardiopulmonary disease.  The patient states that she has been working on weight loss.  I think that this could only help her current health.  I believe that the loss of sensation around the incisions and stabbing and burning that she is experiencing is likely due to the superficial nerves that were affected during surgery.  This should subside over time however, I let her know that if she does not get relief in the next month or so please contact our office again for a follow-up appointment.  Recommend follow-up with Dr. Tresa Endo, Dr. Francine Graven, and Dr. Cliffton Asters as needed.  No medication changes were made during this visit.  However, she states that her albuterol inhaler is no longer working and may need a medication change.    Sharlene Dory 08/05/2020

## 2020-08-05 NOTE — Telephone Encounter (Signed)
-----   Message from Martina Sinner, MD sent at 08/04/2020 11:33 PM EDT ----- Regarding: Schedule PFT I ordered PFTs to be scheduled with her next follow up visit if this could be setup please.   Thank you, Cletis Athens

## 2020-08-08 NOTE — Progress Notes (Signed)
Chief Complaint:   OBESITY Margaret Golden is here to discuss her progress with her obesity treatment plan along with follow-up of her obesity related diagnoses. Margaret Golden is on the Category 3 Plan and states she is following her eating plan approximately 10-20% of the time. Margaret Golden states she is doing 0 minutes 0 times per week.  Today's visit was #: 11 Starting weight: 367 lbs Starting date: 09/03/2019 Today's weight: 362 lbs Today's date: 08/04/2020 Total lbs lost to date: 5 Total lbs lost since last in-office visit: 3  Interim History: Margaret Golden did well with weight loss. She is skipping breakfast on some days. She does not drink enough water.  Subjective:   1. Vitamin D deficiency Margaret Golden is on Vit D, and she denies nausea, vomiting, or muscle weakness.  2. Other hyperlipidemia Margaret Golden is not on medications, and she is not exercising.  3. At risk for heart disease Margaret Golden is at a higher than average risk for cardiovascular disease due to obesity.   Assessment/Plan:   1. Vitamin D deficiency Low Vitamin D level contributes to fatigue and are associated with obesity, breast, and colon cancer. We will refill prescription Vitamin D for 1 month. Margaret Golden will follow-up for routine testing of Vitamin D, at least 2-3 times per year to avoid over-replacement.  - Vitamin D, Ergocalciferol, (DRISDOL) 1.25 MG (50000 UNIT) CAPS capsule; Take 1 capsule (50,000 Units total) by mouth 2 (two) times a week.  Dispense: 10 capsule; Refill: 0  2. Other hyperlipidemia Cardiovascular risk and specific lipid/LDL goals reviewed. We discussed several lifestyle modifications today. Margaret Golden will start walking as tolerated, and will continue her meal plan. Orders and follow up as documented in patient record.   Counseling Intensive lifestyle modifications are the first line treatment for this issue. . Dietary changes: Increase soluble fiber. Decrease simple carbohydrates. . Exercise changes: Moderate to  vigorous-intensity aerobic activity 150 minutes per week if tolerated. . Lipid-lowering medications: see documented in medical record.  3. At risk for heart disease Margaret Golden was given approximately 15 minutes of coronary artery disease prevention counseling today. She is 30 y.o. female and has risk factors for heart disease including obesity. We discussed intensive lifestyle modifications today with an emphasis on specific weight loss instructions and strategies.   Repetitive spaced learning was employed today to elicit superior memory formation and behavioral change.  4. Class 3 severe obesity with serious comorbidity and body mass index (BMI) of 50.0 to 59.9 in adult, unspecified obesity type (HCC) Margaret Golden is currently in the action stage of change. As such, her goal is to continue with weight loss efforts. She has agreed to the Category 4 Plan.   Exercise goals: No exercise has been prescribed at this time.  Behavioral modification strategies: meal planning and cooking strategies and keeping healthy foods in the home.  Margaret Golden has agreed to follow-up with our clinic in 2 weeks. She was informed of the importance of frequent follow-up visits to maximize her success with intensive lifestyle modifications for her multiple health conditions.   Objective:   Blood pressure 102/71, pulse 83, temperature 97.6 F (36.4 C), height 5\' 7"  (1.702 m), weight (!) 362 lb (164.2 kg), SpO2 98 %. Body mass index is 56.7 kg/m.  General: Cooperative, alert, well developed, in no acute distress. HEENT: Conjunctivae and lids unremarkable. Cardiovascular: Regular rhythm.  Lungs: Normal work of breathing. Neurologic: No focal deficits.   Lab Results  Component Value Date   CREATININE 0.88 07/26/2020   BUN 10 07/26/2020  NA 138 07/26/2020   K 4.2 07/26/2020   CL 103 07/26/2020   CO2 29 07/26/2020   Lab Results  Component Value Date   ALT 20 07/26/2020   AST 30 07/26/2020   ALKPHOS 77 07/26/2020    BILITOT 0.6 07/26/2020   Lab Results  Component Value Date   HGBA1C 4.9 09/03/2019   HGBA1C 5.0 07/30/2019   HGBA1C 4.6 07/06/2016   Lab Results  Component Value Date   INSULIN 29.2 (H) 09/03/2019   Lab Results  Component Value Date   TSH 0.66 11/20/2019   Lab Results  Component Value Date   CHOL 150 09/03/2019   HDL 39 (L) 09/03/2019   LDLCALC 89 09/03/2019   TRIG 120 09/03/2019   CHOLHDL 4 07/21/2019   Lab Results  Component Value Date   WBC 12.4 (H) 06/28/2020   HGB 12.9 06/28/2020   HCT 37.4 06/28/2020   MCV 84.2 06/28/2020   PLT 177 06/28/2020   Lab Results  Component Value Date   IRON 54 04/26/2020   TIBC 370 04/26/2020   FERRITIN 66 04/26/2020   Attestation Statements:   Reviewed by clinician on day of visit: allergies, medications, problem list, medical history, surgical history, family history, social history, and previous encounter notes.   Trude Mcburney, am acting as transcriptionist for [employer], PA-C.  I have reviewed the above documentation for accuracy and completeness, and I agree with the above. Alois Cliche, PA-C

## 2020-08-10 ENCOUNTER — Other Ambulatory Visit: Payer: Self-pay | Admitting: Family Medicine

## 2020-08-10 ENCOUNTER — Other Ambulatory Visit: Payer: Self-pay | Admitting: Psychiatry

## 2020-08-10 ENCOUNTER — Ambulatory Visit: Payer: BC Managed Care – PPO | Admitting: Pulmonary Disease

## 2020-08-10 ENCOUNTER — Other Ambulatory Visit: Payer: Self-pay | Admitting: Family

## 2020-08-10 ENCOUNTER — Other Ambulatory Visit: Payer: Self-pay | Admitting: Cardiology

## 2020-08-10 DIAGNOSIS — F4323 Adjustment disorder with mixed anxiety and depressed mood: Secondary | ICD-10-CM | POA: Diagnosis not present

## 2020-08-10 DIAGNOSIS — G4700 Insomnia, unspecified: Secondary | ICD-10-CM

## 2020-08-10 DIAGNOSIS — G2581 Restless legs syndrome: Secondary | ICD-10-CM

## 2020-08-10 NOTE — Telephone Encounter (Signed)
Refill sent to pharmacy.   

## 2020-08-11 NOTE — Telephone Encounter (Signed)
pft has been scheduled for 6/28-pr

## 2020-08-15 ENCOUNTER — Other Ambulatory Visit: Payer: Self-pay

## 2020-08-15 DIAGNOSIS — N920 Excessive and frequent menstruation with regular cycle: Secondary | ICD-10-CM

## 2020-08-15 DIAGNOSIS — D500 Iron deficiency anemia secondary to blood loss (chronic): Secondary | ICD-10-CM

## 2020-08-15 NOTE — Telephone Encounter (Signed)
Staff message sent to Toniann Fail asking her to call and schedule AEX for patient.

## 2020-08-15 NOTE — Telephone Encounter (Signed)
Last Annual exam 05/29/2019 -- Not currently scheduled for AEX D&C, Hyst 03/2020  This Rx was prescribed by Leota Sauers, CNM with last fill date of 06/23/19.

## 2020-08-15 NOTE — Telephone Encounter (Signed)
She had a mirena IUD place in 12/21, never came for her f/u visit. Please schedule her for an annual exam, she is overdue. I'm not sure she needs iron? Her last Hgb was normal and the IUD should be controlling her bleeding.

## 2020-08-16 DIAGNOSIS — F4323 Adjustment disorder with mixed anxiety and depressed mood: Secondary | ICD-10-CM | POA: Diagnosis not present

## 2020-08-17 ENCOUNTER — Ambulatory Visit (INDEPENDENT_AMBULATORY_CARE_PROVIDER_SITE_OTHER): Payer: BC Managed Care – PPO | Admitting: Adult Health

## 2020-08-17 ENCOUNTER — Encounter: Payer: Self-pay | Admitting: Adult Health

## 2020-08-17 VITALS — BP 117/88 | HR 80 | Ht 67.0 in | Wt 359.0 lb

## 2020-08-17 DIAGNOSIS — R519 Headache, unspecified: Secondary | ICD-10-CM

## 2020-08-17 DIAGNOSIS — G43709 Chronic migraine without aura, not intractable, without status migrainosus: Secondary | ICD-10-CM | POA: Diagnosis not present

## 2020-08-17 NOTE — Progress Notes (Signed)
PATIENT: Margaret Golden DOB: 1991-02-18  REASON FOR VISIT: follow up HISTORY FROM: patient  HISTORY OF PRESENT ILLNESS: Today 08/17/20:  Margaret Golden is a 30 year old female with a history of migraine headaches.  She returns today for follow-up.  She states that her headaches have improved.  She has approximately 2-3 migraines a month.  She states Excedrin Migraine usually resolves her headaches.  She was unable to state how many regular headache she has each month.  The patient did do a sleep study that showed moderate to severe sleep apnea but titration study was never scheduled.  02/16/20: Margaret Golden is a 30 year old female with a history of migraines.  She returns today for follow-up.  She states that she wakes up at least 3 times a week with a headache.  Sometimes the headache gets better as the day goes on sometimes and lingers all day or may even get worse.  She states that with these headaches she does not have photophobia or phonophobia.  Reports some nausea but no vomiting.  She states approximately once every other week sometimes more often she will have a headache that feels like a tight headband around her head.  She denies photophobia and phonophobia.  Reports nausea.  She continues to take Topamax 100 mg at bedtime.  Reports that she tried Maxalt with no benefit.  She was referred for sleep study.  She had this last week and is awaiting results.  Returns today for evaluation.   HISTORY Margaret Golden is a 30 y.o. female here as requested by Quillian Quince, MD for migraines.  Past medical history thyroid disease, restless leg syndrome, migraines, major depressive disorder, lower extremity edema, joint pain, hypothyroidism, history of kidney stones, fatty liver, exercise-induced asthma, depression, back pain, arthritis, anxiety, ADD, HTN, obesity.  I reviewed Dr. Francena Hanly notes, patient reports chronic migraines, can have up to 10-14 headaches per month, she has been using  over-the-counter Excedrin Migraine 250 mg 2 tablets as needed, she also has depression with anxiety, fatigue and low energy it may be related to obesity depression or many other causes including migraine, behavior modification was discussed and she is currently in the action stage of change for weight loss.   I was able to review neurologist Dr. Moises Blood notes he saw patient in 2019, patient reported 7 out of 10 intensity, 3-4 headache days a month which 1 to 2 days are migraines, no NSAIDs, she tried Excedrin Migraine in the past, she also tried sertraline and Topamax, lamotrigine and Horizant in the past as well, rare alcohol no smoker, depression controlled, migraine started as a teenager and became more frequent at age 89, bandlike distribution, pressure stabbing, no aura no prodrome no post room, associated symptoms include photophobia, phonophobia, nausea, no vomiting or visual disturbance often, not waking her up from sleep, can last up to 3 days with a total of 7 headache days a month initially and improved to 3-4 headache days a month at follow-up appointment.  Neurologic exam was normal.  Topiramate was continued at that time.  Migraines have gotten worse, she has been managing with excedrin and aspirin, was taking once a week but now worsening for >3 months and worsenined with anemia when she was hospitalized and migraines worsening. She is waking up with headaches, a few years ago she had a sleep test and it was negative, tired during the day, very fatigued, daily headaches in the morning and feels like a band around her head, migraines  are pulsating/pounding/throbbing, photophobia/phonophobia, nausea, no vomiting, it hurts to move, she reports "slight double vision", laying down and sleeping helps, she is getting 14 migraine days a month, suffering all are moderate to severe, no medication overuse, no aura, can last 24-48 hours untreated if she catches them soon they can last a few hours. She had  one that last 1.5 weeks in the past. She has associated vertigo/dizziness but no weakness or sensory changes. She drinks caffeine is trying to cut back but she has a caffeine "addiction" and we discussed rebound headache.   From a thorough review of records: Medication tried that can be used in migraine management include ibuprofen, Aleve, Tylenol, Fioricet, sumatriptan, Zofran, propranolol, Topamax, bupropion, Excedrin Migraine, lamotrigine, Horizant, metoprolol.  Reviewed notes, labs and imaging from outside physicians, which showed:  Ferritin 6, TSH 18.3, CMP unremarkable   REVIEW OF SYSTEMS: Out of a complete 14 system review of symptoms, the patient complains only of the following symptoms, and all other reviewed systems are negative.  See HPI  ALLERGIES: Allergies  Allergen Reactions  . Augmentin [Amoxicillin-Pot Clavulanate] Hives    HOME MEDICATIONS: Outpatient Medications Prior to Visit  Medication Sig Dispense Refill  . Armodafinil 150 MG tablet Take 1 tablet (150 mg total) by mouth daily. 30 tablet 2  . aspirin-acetaminophen-caffeine (EXCEDRIN MIGRAINE) 250-250-65 MG tablet Take 2 tablets by mouth daily as needed for headache or migraine.    . furosemide (LASIX) 20 MG tablet TAKE 1 TABLET BY MOUTH TWO TIMES A WEEK 26 tablet 5  . gabapentin (NEURONTIN) 600 MG tablet Take 600 mg by mouth 3 (three) times daily.    . Iron-FA-B Cmp-C-Biot-Probiotic (FUSION PLUS) CAPS One capsule twice daily with orange juice. Stop your Ferrous sulfate. (Patient taking differently: Take 1 capsule by mouth 2 (two) times daily. with orange juice. Stop your Ferrous sulfate.) 30 capsule 1  . lamoTRIgine (LAMICTAL) 200 MG tablet Take 1 tablet (200 mg total) by mouth daily. 90 tablet 0  . levothyroxine (SYNTHROID) 100 MCG tablet TAKE ONE TABLET BY MOUTH DAILY 90 tablet 0  . levothyroxine (SYNTHROID) 112 MCG tablet Take 112 mcg by mouth daily before breakfast. Take with the 100 mcg dose to equal 212  mcg daily    . LORazepam (ATIVAN) 0.5 MG tablet Take 1 tablet (0.5 mg total) by mouth every 8 (eight) hours as needed for anxiety. 30 tablet 1  . methotrexate (RHEUMATREX) 2.5 MG tablet Take 2 tablets (5 mg total) by mouth once a week. Caution:Chemotherapy. Protect from light. 16 tablet 2  . Multiple Vitamins-Minerals (MULTIVITAMIN WITH MINERALS) tablet Take 1 tablet by mouth daily.    Marland Kitchen omeprazole (PRILOSEC) 20 MG capsule TAKE TWO CAPSULES BY MOUTH DAILY 60 capsule 0  . potassium chloride SA (KLOR-CON) 20 MEQ tablet Take 1 tablet (20 mEq total) by mouth 2 (two) times a week. 26 tablet 0  . propranolol (INDERAL) 20 MG tablet Take 1 tablet (20 mg total) by mouth 3 (three) times daily. 270 tablet 3  . sertraline (ZOLOFT) 100 MG tablet Take 2 tablets (200 mg total) by mouth daily. 180 tablet 1  . topiramate (TOPAMAX) 50 MG tablet Start with 50mg (1 tab) at bedtime. In 1 week increase to 100mg (2 tabs) at bedtime. (Patient taking differently: Take 100 mg by mouth at bedtime.) 180 tablet 6  . traZODone (DESYREL) 50 MG tablet Take 1/2-2 po QHS prn insomnia (Patient taking differently: Take 25-100 mg by mouth at bedtime as needed for sleep.) 60 tablet 1  .  Vitamin D, Ergocalciferol, (DRISDOL) 1.25 MG (50000 UNIT) CAPS capsule Take 1 capsule (50,000 Units total) by mouth 2 (two) times a week. 10 capsule 0  . albuterol (VENTOLIN HFA) 108 (90 Base) MCG/ACT inhaler Inhale 2 puffs into the lungs every 6 (six) hours as needed for wheezing or shortness of breath (Chest tightness). 18 g 0  . traMADol (ULTRAM) 50 MG tablet Take 1-2 tablets (50-100 mg total) by mouth every 6 (six) hours as needed (mild pain). 30 tablet 0   No facility-administered medications prior to visit.    PAST MEDICAL HISTORY: Past Medical History:  Diagnosis Date  . Acid reflux   . ADD (attention deficit disorder)   . Anemia   . Anxiety   . Arthritis    left ankle  . Back pain   . Chest pain   . Complication of anesthesia   .  Depression   . Dyspnea    with exertion  . Exercise-induced asthma    as a child  . Fatty liver   . History of blood transfusion   . History of chicken pox   . History of kidney stones    passed   . Hypothyroidism   . Joint pain   . Lower extremity edema   . Major depressive disorder   . Migraines    chronic  . Palpitations   . Pneumonia    x 2  . PONV (postoperative nausea and vomiting)    nausea  . RLS (restless legs syndrome)   . Sleep apnea 2021  . Thyroid disease    hypothyroid    PAST SURGICAL HISTORY: Past Surgical History:  Procedure Laterality Date  . BRONCHIAL NEEDLE ASPIRATION BIOPSY  03/31/2020   Procedure: BRONCHIAL NEEDLE ASPIRATION BIOPSIES;  Surgeon: Josephine Igo, DO;  Location: [facility];  Service: Pulmonary;;  . BRONCHIAL WASHINGS  03/31/2020   Procedure: BRONCHIAL WASHINGS;  Surgeon: Josephine Igo, DO;  Location: [facility];  Service: Pulmonary;;  . DILATATION & CURETTAGE/HYSTEROSCOPY WITH MYOSURE N/A 03/22/2020   Procedure: DILATATION & CURETTAGE/HYSTEROSCOPY WITH MYOSURE;  Surgeon: Romualdo Bolk, MD;  Location: [facility] OR;  Service: Gynecology;  Laterality: N/A;  . INTERCOSTAL NERVE BLOCK Right 06/27/2020   Procedure: INTERCOSTAL NERVE BLOCK;  Surgeon: Corliss Skains, MD;  Location: [facility] OR;  Service: Thoracic;  Laterality: Right;  . INTRAUTERINE DEVICE (IUD) INSERTION N/A 03/22/2020   Procedure: INTRAUTERINE DEVICE (IUD) INSERTION;  Surgeon: Romualdo Bolk, MD;  Location: [facility] OR;  Service: Gynecology;  Laterality: N/A;  Mirena IUD insertion  . KNEE ARTHROSCOPY Right 2007  . LYMPH NODE BIOPSY Right 06/27/2020   Procedure: LYMPH NODE BIOPSY;  Surgeon: Corliss Skains, MD;  Location: [facility] OR;  Service: Thoracic;  Laterality: Right;  . TONSILLECTOMY  2010  . TONSILLECTOMY    . VIDEO BRONCHOSCOPY WITH ENDOBRONCHIAL ULTRASOUND N/A 03/31/2020   Procedure: VIDEO BRONCHOSCOPY WITH ENDOBRONCHIAL ULTRASOUND;  Surgeon: Josephine Igo,  DO;  Location: [facility];  Service: Pulmonary;  Laterality: N/A;  . WISDOM TOOTH EXTRACTION    . WISDOM TOOTH EXTRACTION Bilateral    upper and lower    FAMILY HISTORY: Family History  Problem Relation Age of Onset  . Depression Mother   . Obesity Mother   . Depression Father   . Sleep apnea Father   . Obesity Father   . Cancer Paternal Aunt        colon  . Cancer Paternal Uncle        brain cancer  .  Diabetes Maternal Grandfather   . Arthritis Maternal Grandfather   . Arthritis Maternal Grandmother   . Breast cancer Maternal Grandmother   . Arthritis Paternal Grandmother   . Arthritis Paternal Grandfather   . Migraines Neg Hx     SOCIAL HISTORY: Social History   Socioeconomic History  . Marital status: Single    Spouse name: Not on file  . Number of children: 0  . Years of education: 2012  . Highest education level: Not on file  Occupational History  . Occupation: Conservation officer, historic buildingsullfillment employee  Tobacco Use  . Smoking status: Never Smoker  . Smokeless tobacco: Never Used  Vaping Use  . Vaping Use: Never used  Substance and Sexual Activity  . Alcohol use: Not Currently    Alcohol/week: 0.0 standard drinks  . Drug use: No  . Sexual activity: Never    Comment: never sexually active  Other Topics Concern  . Not on file  Social History Narrative   Unemployed   Lives alone --update 11/11/19   Seeking work   Only child   Dog 3   Lives in a one story house with father, has a cat-12/26/16-sjb      Caffeine: 2-4 cups/day   Social Determinants of Corporate investment bankerHealth   Financial Resource Strain: Not on file  Food Insecurity: Not on file  Transportation Needs: Not on file  Physical Activity: Not on file  Stress: Not on file  Social Connections: Not on file  Intimate Partner Violence: Not on file      PHYSICAL EXAM  Vitals:   08/17/20 1450  BP: 117/88  Pulse: 80  Weight: (!) 359 lb (162.8 kg)  Height: 5\' 7"  (1.702 m)   Body mass index is 56.23 kg/m.  Generalized:  Well developed, in no acute distress   Neurological examination  Mentation: Alert oriented to time, place, history taking. Follows all commands speech and language fluent Cranial nerve II-XII: Pupils were equal round reactive to light. Extraocular movements were full, visual field were full on confrontational test. Facial sensation and strength were normal. Uvula tongue midline. Head turning and shoulder shrug  were normal and symmetric. Motor: The motor testing reveals 5 over 5 strength of all 4 extremities. Good symmetric motor tone is noted throughout.  Sensory: Sensory testing is intact to soft touch on all 4 extremities. No evidence of extinction is noted.  Coordination: Cerebellar testing reveals good finger-nose-finger and heel-to-shin bilaterally.  Gait and station: Gait is normal.  Reflexes: Deep tendon reflexes are symmetric and normal bilaterally.   DIAGNOSTIC DATA (LABS, IMAGING, TESTING) - I reviewed patient records, labs, notes, testing and imaging myself where available.  Lab Results  Component Value Date   WBC 12.4 (H) 06/28/2020   HGB 12.9 06/28/2020   HCT 37.4 06/28/2020   MCV 84.2 06/28/2020   PLT 177 06/28/2020      Component Value Date/Time   NA 138 07/26/2020 1423   K 4.2 07/26/2020 1423   CL 103 07/26/2020 1423   CO2 29 07/26/2020 1423   GLUCOSE 84 07/26/2020 1423   BUN 10 07/26/2020 1423   CREATININE 0.88 07/26/2020 1423   CREATININE 0.74 09/30/2019 1047   CREATININE 0.75 07/06/2016 1534   CALCIUM 9.3 07/26/2020 1423   PROT 7.6 07/26/2020 1423   ALBUMIN 4.2 07/26/2020 1423   AST 30 07/26/2020 1423   AST 27 09/30/2019 1047   ALT 20 07/26/2020 1423   ALT 14 09/30/2019 1047   ALKPHOS 77 07/26/2020 1423   BILITOT 0.6 07/26/2020  1423   BILITOT 0.4 09/30/2019 1047   GFRNONAA >60 06/28/2020 0703   GFRNONAA >60 09/30/2019 1047   GFRAA >60 09/30/2019 1047   Lab Results  Component Value Date   CHOL 150 09/03/2019   HDL 39 (L) 09/03/2019   LDLCALC 89  09/03/2019   TRIG 120 09/03/2019   CHOLHDL 4 07/21/2019   Lab Results  Component Value Date   HGBA1C 4.9 09/03/2019   Lab Results  Component Value Date   VITAMINB12 473 09/03/2019   Lab Results  Component Value Date   TSH 0.66 11/20/2019      ASSESSMENT AND PLAN 30 y.o. year old female  has a past medical history of Acid reflux, ADD (attention deficit disorder), Anemia, Anxiety, Arthritis, Back pain, Chest pain, Complication of anesthesia, Depression, Dyspnea, Exercise-induced asthma, Fatty liver, History of blood transfusion, History of chicken pox, History of kidney stones, Hypothyroidism, Joint pain, Lower extremity edema, Major depressive disorder, Migraines, Palpitations, Pneumonia, PONV (postoperative nausea and vomiting), RLS (restless legs syndrome), Sleep apnea (2021), and Thyroid disease. here with:  1.  Migraine headaches 2.  Morning headaches  -Continue Topamax 50 mg in the morning and 100 mg in the evening -Advised that I will send a message to Dr. Vickey Huger regarding CPAP titration -Follow-up in 6 months or sooner if needed   Butch Penny, MSN, NP-C 08/17/2020, 3:08 PM [facility] [address] [place], [place] [address] [phone]

## 2020-08-22 ENCOUNTER — Encounter (INDEPENDENT_AMBULATORY_CARE_PROVIDER_SITE_OTHER): Payer: Self-pay | Admitting: Physician Assistant

## 2020-08-22 ENCOUNTER — Ambulatory Visit (INDEPENDENT_AMBULATORY_CARE_PROVIDER_SITE_OTHER): Payer: BC Managed Care – PPO | Admitting: Physician Assistant

## 2020-08-22 ENCOUNTER — Other Ambulatory Visit: Payer: Self-pay

## 2020-08-22 VITALS — BP 120/77 | HR 87 | Temp 98.2°F | Ht 67.0 in | Wt 359.0 lb

## 2020-08-22 DIAGNOSIS — Z6841 Body Mass Index (BMI) 40.0 and over, adult: Secondary | ICD-10-CM

## 2020-08-22 DIAGNOSIS — E559 Vitamin D deficiency, unspecified: Secondary | ICD-10-CM

## 2020-08-22 DIAGNOSIS — E8881 Metabolic syndrome: Secondary | ICD-10-CM

## 2020-08-22 DIAGNOSIS — Z9189 Other specified personal risk factors, not elsewhere classified: Secondary | ICD-10-CM | POA: Diagnosis not present

## 2020-08-22 MED ORDER — VITAMIN D (ERGOCALCIFEROL) 1.25 MG (50000 UNIT) PO CAPS: 50000.0000 [IU] | ORAL_CAPSULE | ORAL | 0 refills | Status: DC

## 2020-08-23 ENCOUNTER — Encounter: Payer: Self-pay | Admitting: Family

## 2020-08-23 ENCOUNTER — Other Ambulatory Visit: Payer: Self-pay

## 2020-08-23 ENCOUNTER — Inpatient Hospital Stay: Payer: BC Managed Care – PPO | Attending: Family

## 2020-08-23 ENCOUNTER — Inpatient Hospital Stay (HOSPITAL_BASED_OUTPATIENT_CLINIC_OR_DEPARTMENT_OTHER): Payer: BC Managed Care – PPO | Admitting: Family

## 2020-08-23 VITALS — BP 112/97 | HR 83 | Temp 98.7°F | Resp 18 | Ht 67.0 in | Wt 366.0 lb

## 2020-08-23 DIAGNOSIS — Z79899 Other long term (current) drug therapy: Secondary | ICD-10-CM | POA: Diagnosis not present

## 2020-08-23 DIAGNOSIS — D500 Iron deficiency anemia secondary to blood loss (chronic): Secondary | ICD-10-CM | POA: Diagnosis not present

## 2020-08-23 DIAGNOSIS — D869 Sarcoidosis, unspecified: Secondary | ICD-10-CM | POA: Diagnosis not present

## 2020-08-23 DIAGNOSIS — G2581 Restless legs syndrome: Secondary | ICD-10-CM | POA: Insufficient documentation

## 2020-08-23 DIAGNOSIS — F4323 Adjustment disorder with mixed anxiety and depressed mood: Secondary | ICD-10-CM | POA: Diagnosis not present

## 2020-08-23 DIAGNOSIS — N920 Excessive and frequent menstruation with regular cycle: Secondary | ICD-10-CM

## 2020-08-23 LAB — CBC WITH DIFFERENTIAL (CANCER CENTER ONLY)
Abs Immature Granulocytes: 0.03 10*3/uL (ref 0.00–0.07)
Basophils Absolute: 0 10*3/uL (ref 0.0–0.1)
Basophils Relative: 0 %
Eosinophils Absolute: 0.2 10*3/uL (ref 0.0–0.5)
Eosinophils Relative: 2 %
HCT: 42.4 % (ref 36.0–46.0)
Hemoglobin: 14 g/dL (ref 12.0–15.0)
Immature Granulocytes: 0 %
Lymphocytes Relative: 24 %
Lymphs Abs: 1.7 10*3/uL (ref 0.7–4.0)
MCH: 29.3 pg (ref 26.0–34.0)
MCHC: 33 g/dL (ref 30.0–36.0)
MCV: 88.7 fL (ref 80.0–100.0)
Monocytes Absolute: 0.4 10*3/uL (ref 0.1–1.0)
Monocytes Relative: 5 %
Neutro Abs: 5 10*3/uL (ref 1.7–7.7)
Neutrophils Relative %: 69 %
Platelet Count: 177 10*3/uL (ref 150–400)
RBC: 4.78 MIL/uL (ref 3.87–5.11)
RDW: 14.7 % (ref 11.5–15.5)
WBC Count: 7.3 10*3/uL (ref 4.0–10.5)
nRBC: 0 % (ref 0.0–0.2)

## 2020-08-23 LAB — RETICULOCYTES
Immature Retic Fract: 10.8 % (ref 2.3–15.9)
RBC.: 4.85 MIL/uL (ref 3.87–5.11)
Retic Count, Absolute: 135.8 10*3/uL (ref 19.0–186.0)
Retic Ct Pct: 2.8 % (ref 0.4–3.1)

## 2020-08-23 NOTE — Progress Notes (Signed)
Hematology and Oncology Follow Up Visit  Margaret Golden [id number] January 15, 1991 30 y.o. 08/23/2020   Principle Diagnosis:  Iron deficiency anemia secondary to heavy irregular cycles  Current Therapy: IV iron as indicated   Interim History:  [name] is here today for follow-up. She is doing fairly well but does have some fatigue.  She was recently diagnosed with sarcoidosis and has started treatment with Methotrexate. She is tolerating this nicely so far other than a slight increase in her restless leg syndrome. She plans to contact her neurologist and request a refill on her Neurontin.  She has some SOB and chest tightness at times with over exertion and takes a break to rest when needed.  She is waiting to hear from neurology to schedule a sleep study.  No fever, chills, n/v, cough, rash, dizziness, palpitations, abdominal pain or changes in bowel or bladder habits.  No blood loss noted. No abnormal bruising or petechiae.  The swelling in her feet is improved.  No numbness or tingling in her extremities at this time.  She has maintained a good appetite and is staying well hydrated throughout the day. Her weight is 366 lbs.   ECOG Performance Status: 1 - Symptomatic but completely ambulatory  Medications:  Allergies as of 08/23/2020      Reactions   Augmentin [amoxicillin-pot Clavulanate] Hives      Medication List       Accurate as of Aug 23, 2020 11:34 AM. If you have any questions, ask your nurse or doctor.        Armodafinil 150 MG tablet Take 1 tablet (150 mg total) by mouth daily.   aspirin-acetaminophen-caffeine 250-250-65 MG tablet Commonly known as: EXCEDRIN MIGRAINE Take 2 tablets by mouth daily as needed for headache or migraine.   furosemide 20 MG tablet Commonly known as: LASIX TAKE 1 TABLET BY MOUTH TWO TIMES A WEEK   Fusion Plus Caps One capsule twice daily with orange juice. Stop your Ferrous sulfate. What changed:   how much to  take  how to take this  when to take this  additional instructions   gabapentin 600 MG tablet Commonly known as: NEURONTIN Take 600 mg by mouth 3 (three) times daily.   lamoTRIgine 200 MG tablet Commonly known as: LAMICTAL Take 1 tablet (200 mg total) by mouth daily.   levothyroxine 112 MCG tablet Commonly known as: SYNTHROID Take 112 mcg by mouth daily before breakfast. Take with the 100 mcg dose to equal 212 mcg daily   levothyroxine 100 MCG tablet Commonly known as: SYNTHROID TAKE ONE TABLET BY MOUTH DAILY   LORazepam 0.5 MG tablet Commonly known as: Ativan Take 1 tablet (0.5 mg total) by mouth every 8 (eight) hours as needed for anxiety.   methotrexate 2.5 MG tablet Commonly known as: RHEUMATREX Take 2 tablets (5 mg total) by mouth once a week. Caution:Chemotherapy. Protect from light.   multivitamin with minerals tablet Take 1 tablet by mouth daily.   omeprazole 20 MG capsule Commonly known as: PRILOSEC TAKE TWO CAPSULES BY MOUTH DAILY   potassium chloride SA 20 MEQ tablet Commonly known as: KLOR-CON Take 1 tablet (20 mEq total) by mouth 2 (two) times a week.   propranolol 20 MG tablet Commonly known as: INDERAL Take 1 tablet (20 mg total) by mouth 3 (three) times daily.   sertraline 100 MG tablet Commonly known as: ZOLOFT Take 2 tablets (200 mg total) by mouth daily.   topiramate 50 MG tablet Commonly known as: Topamax  Start with 50mg (1 tab) at bedtime. In 1 week increase to 100mg (2 tabs) at bedtime. What changed:   how much to take  how to take this  when to take this  additional instructions   traZODone 50 MG tablet Commonly known as: DESYREL Take 1/2-2 po QHS prn insomnia What changed:   how much to take  how to take this  when to take this  reasons to take this  additional instructions   Vitamin D (Ergocalciferol) 1.25 MG (50000 UNIT) Caps capsule Commonly known as: DRISDOL Take 1 capsule (50,000 Units total) by mouth 2 (two)  times a week.       Allergies:  Allergies  Allergen Reactions  . Augmentin [Amoxicillin-Pot Clavulanate] Hives    Past Medical History, Surgical history, Social history, and Family History were reviewed and updated.  Review of Systems: All other 10 point review of systems is negative.   Physical Exam:  vitals were not taken for this visit.   Wt Readings from Last 3 Encounters:  08/22/20 (!) 359 lb (162.8 kg)  08/17/20 (!) 359 lb (162.8 kg)  08/05/20 (!) 360 lb (163.3 kg)    Ocular: Sclerae unicteric, pupils equal, round and reactive to light Ear-nose-throat: Oropharynx clear, dentition fair Lymphatic: No cervical or supraclavicular adenopathy Lungs no rales or rhonchi, good excursion bilaterally Heart regular rate and rhythm, no murmur appreciated Abd soft, nontender, positive bowel sounds MSK no focal spinal tenderness, no joint edema Neuro: non-focal, well-oriented, appropriate affect Breasts: Deferred   Lab Results  Component Value Date   WBC 7.3 08/23/2020   HGB 14.0 08/23/2020   HCT 42.4 08/23/2020   MCV 88.7 08/23/2020   PLT 177 08/23/2020   Lab Results  Component Value Date   FERRITIN 66 04/26/2020   IRON 54 04/26/2020   TIBC 370 04/26/2020   UIBC 316 04/26/2020   IRONPCTSAT 15 (L) 04/26/2020   Lab Results  Component Value Date   RETICCTPCT 2.8 08/23/2020   RBC 4.78 08/23/2020   No results found for: KPAFRELGTCHN, LAMBDASER, KAPLAMBRATIO No results found for: IGGSERUM, IGA, IGMSERUM No results found for: 08/25/2020, SPEI   Chemistry      Component Value Date/Time   NA 138 07/26/2020 1423   K 4.2 07/26/2020 1423   CL 103 07/26/2020 1423   CO2 29 07/26/2020 1423   BUN 10 07/26/2020 1423   CREATININE 0.88 07/26/2020 1423   CREATININE 0.74 09/30/2019 1047   CREATININE 0.75 07/06/2016 1534      Component Value Date/Time   CALCIUM 9.3 07/26/2020 1423   ALKPHOS 77 07/26/2020 1423   AST 30  07/26/2020 1423   AST 27 09/30/2019 1047   ALT 20 07/26/2020 1423   ALT 14 09/30/2019 1047   BILITOT 0.6 07/26/2020 1423   BILITOT 0.4 09/30/2019 1047       Impression and Plan: Ms. Detamore is a very pleasant30 yo caucasian female with long history of iron deficiency anemia secondary to history of heavy irregular cycles.She was also recently diagnosed with sarcoidosis and has started treatment with Methotrexate.  Iron studies are pending. We will replace if needed.  Follow-up in 6 months.  She was encouraged to contact our office with any questions or concerns. We can certainly see her sooner if needed.   10/02/2019, NP 5/24/202211:34 AM

## 2020-08-23 NOTE — Progress Notes (Signed)
Chief Complaint:   OBESITY Margaret Golden is here to discuss her progress with her obesity treatment plan along with follow-up of her obesity related diagnoses. Margaret Golden is on the Category 4 Plan and states she is following her eating plan approximately 20% of the time. Margaret Golden states she is doing 0 minutes 0 times per week.  Today's visit was #: 12 Starting weight: 367 lbs Starting date: 09/03/2019 Today's weight: 359 lbs Today's date: 08/22/2020 Total lbs lost to date: 8 Total lbs lost since last in-office visit: 3  Interim History: Margaret Golden had a friend in for the weekend and she splurged on pizza and desserts. On other days she was skipping 1-2 meals a day.  Subjective:   1. Vitamin D deficiency Margaret Golden is on Vit D twice weekly, and she is tolerating well.  2. Insulin resistance Margaret Golden is not on medications, and she denies polyphagia.  3. At risk for diabetes mellitus Margaret Golden is at higher than average risk for developing diabetes due to obesity.   Assessment/Plan:   1. Vitamin D deficiency Low Vitamin D level contributes to fatigue and are associated with obesity, breast, and colon cancer. We will refill prescription Vitamin D for 1 month. Margaret Golden will follow-up for routine testing of Vitamin D, at least 2-3 times per year to avoid over-replacement.  - Vitamin D, Ergocalciferol, (DRISDOL) 1.25 MG (50000 UNIT) CAPS capsule; Take 1 capsule (50,000 Units total) by mouth 2 (two) times a week.  Dispense: 10 capsule; Refill: 0  2. Insulin resistance Margaret Golden will continue her meal plan, and will continue to work on weight loss, exercise, and decreasing simple carbohydrates to help decrease the risk of diabetes. Margaret Golden agreed to follow-up with [place] as directed to closely monitor her progress.  3. At risk for diabetes mellitus Margaret Golden was given approximately 15 minutes of diabetes education and counseling today. We discussed intensive lifestyle modifications today with an emphasis on weight loss as  well as increasing exercise and decreasing simple carbohydrates in her diet. We also reviewed medication options with an emphasis on risk versus benefit of those discussed.   Repetitive spaced learning was employed today to elicit superior memory formation and behavioral change.  4. Class 3 severe obesity with serious comorbidity and body mass index (BMI) of 50.0 to 59.9 in adult, unspecified obesity type (HCC) Margaret Golden is currently in the action stage of change. As such, her goal is to continue with weight loss efforts. She has agreed to the Category 4 Plan.   We will recheck fasting labs at her next visit.  Exercise goals: No exercise has been prescribed at this time.  Behavioral modification strategies: no skipping meals and meal planning and cooking strategies.  Consuella has agreed to follow-up with our clinic in 4 weeks. She was informed of the importance of frequent follow-up visits to maximize her success with intensive lifestyle modifications for her multiple health conditions.   Objective:   Blood pressure 120/77, pulse 87, temperature 98.2 F (36.8 C), height 5\' 7"  (1.702 m), weight (!) 359 lb (162.8 kg), SpO2 98 %. Body mass index is 56.23 kg/m.  General: Cooperative, alert, well developed, in no acute distress. HEENT: Conjunctivae and lids unremarkable. Cardiovascular: Regular rhythm.  Lungs: Normal work of breathing. Neurologic: No focal deficits.   Lab Results  Component Value Date   CREATININE 0.88 07/26/2020   BUN 10 07/26/2020   NA 138 07/26/2020   K 4.2 07/26/2020   CL 103 07/26/2020   CO2 29 07/26/2020  Lab Results  Component Value Date   ALT 20 07/26/2020   AST 30 07/26/2020   ALKPHOS 77 07/26/2020   BILITOT 0.6 07/26/2020   Lab Results  Component Value Date   HGBA1C 4.9 09/03/2019   HGBA1C 5.0 07/30/2019   HGBA1C 4.6 07/06/2016   Lab Results  Component Value Date   INSULIN 29.2 (H) 09/03/2019   Lab Results  Component Value Date   TSH 0.66  11/20/2019   Lab Results  Component Value Date   CHOL 150 09/03/2019   HDL 39 (L) 09/03/2019   LDLCALC 89 09/03/2019   TRIG 120 09/03/2019   CHOLHDL 4 07/21/2019   Lab Results  Component Value Date   WBC 12.4 (H) 06/28/2020   HGB 12.9 06/28/2020   HCT 37.4 06/28/2020   MCV 84.2 06/28/2020   PLT 177 06/28/2020   Lab Results  Component Value Date   IRON 54 04/26/2020   TIBC 370 04/26/2020   FERRITIN 66 04/26/2020   Attestation Statements:   Reviewed by clinician on day of visit: allergies, medications, problem list, medical history, surgical history, family history, social history, and previous encounter notes.   Trude Mcburney, am acting as transcriptionist for [employer], PA-C.  I have reviewed the above documentation for accuracy and completeness, and I agree with the above. -  *Alois Cliche, PA-C

## 2020-08-24 ENCOUNTER — Telehealth: Payer: Self-pay | Admitting: *Deleted

## 2020-08-24 LAB — FERRITIN: Ferritin: 72 ng/mL (ref 11–307)

## 2020-08-24 LAB — IRON AND TIBC
Iron: 113 ug/dL (ref 41–142)
Saturation Ratios: 37 % (ref 21–57)
TIBC: 304 ug/dL (ref 236–444)
UIBC: 190 ug/dL (ref 120–384)

## 2020-08-24 NOTE — Telephone Encounter (Signed)
Per 08/23/20 los - called and lvm of upcoming appointments - mailed calendar 

## 2020-08-30 DIAGNOSIS — F4323 Adjustment disorder with mixed anxiety and depressed mood: Secondary | ICD-10-CM | POA: Diagnosis not present

## 2020-09-01 DIAGNOSIS — F4323 Adjustment disorder with mixed anxiety and depressed mood: Secondary | ICD-10-CM | POA: Diagnosis not present

## 2020-09-05 DIAGNOSIS — F4323 Adjustment disorder with mixed anxiety and depressed mood: Secondary | ICD-10-CM | POA: Diagnosis not present

## 2020-09-13 DIAGNOSIS — F4323 Adjustment disorder with mixed anxiety and depressed mood: Secondary | ICD-10-CM | POA: Diagnosis not present

## 2020-09-19 DIAGNOSIS — F4323 Adjustment disorder with mixed anxiety and depressed mood: Secondary | ICD-10-CM | POA: Diagnosis not present

## 2020-09-21 ENCOUNTER — Ambulatory Visit: Payer: BC Managed Care – PPO | Admitting: Psychiatry

## 2020-09-21 ENCOUNTER — Encounter: Payer: Self-pay | Admitting: Psychiatry

## 2020-09-21 ENCOUNTER — Other Ambulatory Visit: Payer: Self-pay

## 2020-09-21 DIAGNOSIS — F4010 Social phobia, unspecified: Secondary | ICD-10-CM

## 2020-09-21 DIAGNOSIS — F339 Major depressive disorder, recurrent, unspecified: Secondary | ICD-10-CM

## 2020-09-21 DIAGNOSIS — G2581 Restless legs syndrome: Secondary | ICD-10-CM

## 2020-09-21 MED ORDER — GABAPENTIN 300 MG PO CAPS: 300.0000 mg | ORAL_CAPSULE | Freq: Three times a day (TID) | ORAL | 0 refills | Status: DC

## 2020-09-21 MED ORDER — SERTRALINE HCL 100 MG PO TABS: 200.0000 mg | ORAL_TABLET | Freq: Every day | ORAL | 1 refills | Status: DC

## 2020-09-21 MED ORDER — LAMOTRIGINE 200 MG PO TABS: 200.0000 mg | ORAL_TABLET | Freq: Every day | ORAL | 0 refills | Status: DC

## 2020-09-22 ENCOUNTER — Telehealth (INDEPENDENT_AMBULATORY_CARE_PROVIDER_SITE_OTHER): Payer: BC Managed Care – PPO | Admitting: Physician Assistant

## 2020-09-22 ENCOUNTER — Encounter (INDEPENDENT_AMBULATORY_CARE_PROVIDER_SITE_OTHER): Payer: Self-pay | Admitting: Physician Assistant

## 2020-09-22 DIAGNOSIS — E559 Vitamin D deficiency, unspecified: Secondary | ICD-10-CM

## 2020-09-22 DIAGNOSIS — Z6841 Body Mass Index (BMI) 40.0 and over, adult: Secondary | ICD-10-CM | POA: Diagnosis not present

## 2020-09-23 ENCOUNTER — Other Ambulatory Visit (HOSPITAL_COMMUNITY)
Admission: RE | Admit: 2020-09-23 | Discharge: 2020-09-23 | Disposition: A | Discharge status: Home / Self Care | Payer: BC Managed Care – PPO | Source: Ambulatory Visit | Attending: Pulmonary Disease | Admitting: Pulmonary Disease

## 2020-09-23 DIAGNOSIS — Z01812 Encounter for preprocedural laboratory examination: Secondary | ICD-10-CM | POA: Diagnosis not present

## 2020-09-23 DIAGNOSIS — Z20822 Contact with and (suspected) exposure to covid-19: Secondary | ICD-10-CM | POA: Diagnosis not present

## 2020-09-23 LAB — SARS CORONAVIRUS 2 (TAT 6-24 HRS): SARS Coronavirus 2: NEGATIVE

## 2020-09-24 NOTE — Progress Notes (Signed)
UNNAMED HINO [id number] 05-01-1990 29 y.o.  Subjective:   Patient ID:  Margaret Golden is a 30 y.o. (DOB Dec 26, 1990) female.  Chief Complaint:  Chief Complaint  Patient presents with   Depression   Anxiety   Sleeping Problem    HPI Margaret Golden presents to the office today for follow-up of depression, anxiety, and sleep disturbance. She reports that she has had "ups and downs. More downs." She reports that her father stayed longer than expected and then a friend came to visit when he left. She reports that her mood declined after friend left about a month ago.  She reports that she has been dissociating and has lost track of time to include missing appointments. She reports that she has been eating only once a day due to dissociation. She reports that she is having anxiety in response to increased responsibilities. She reports that she has lost interest in things and is no longer interested in being on the computer and "used to be a computer addict." Some decrease in interaction with online friends. Has some socialization with people at a video game store. Enjoys going to store.   She notices that she has been more irritable than others. She reports that friends have commented that she seems to be angry more often. She reports frequent sad mood. She reports significant fatigue. She reports that she consistently sleeps about 6 hours a night. Appetite has been stable on meal plan. Motivation has been low. Denies SI.   Uses Ativan prn on occasion.   Kitten provides support.   She reports that RLS has been increased and she has been out of gabapentin.   Has started treatment with [facility].   She reports that she had some relief with dx of Sarcoidosis and not Lymphoma.   Past medication trials: Sertraline-helpful for depression and anxiety Wellbutrin- initially helpful and then was less helpful Lexapro-ineffective Cymbalta Pristiq Seroquel- does not  recall any significant improvement Latuda Abilify Propranolol- prescribed for HR. Not helpful for anxiety Melatonin Vyvanse Adderall XR- not effective Concerta- Notices duration only lasts about 4 hours. No significant improvement with doses less than 54 mg.  [name]- May have caused n/v and poor concentration Horizant Lamictal-effective for mood Trazodone-effective for insomnia but causes some excessive drowsiness Rexulti- May have caused n/v and poor concentration Ativan- helpful Armodafinil- jaw locked  PHQ2-9    Flowsheet Row Office Visit from 09/03/2019 in [facility] Nutrition from 02/10/2019 [facility] Office Visit from 05/02/2018 in [facility] [employer] Office Visit from 04/19/2017 in [facility] Nutrition from 02/20/2016 in [facility]  PHQ-2 Total Score 3 2 4 3 4   PHQ-9 Total Score 14 -- 15 16 --      Flowsheet Row Admission (Discharged) from 06/27/2020 in [facility] 4E CV SURGICAL PROGRESSIVE CARE Pre-Admission Testing 60 from 06/23/2020 in [facility] PREADMISSION TESTING [facility] CORE BIOPSY LYM NOD [facility] from 06/09/2020 in [facility] ULTRASOUND  C-SSRS RISK CATEGORY No Risk No Risk No Risk        Review of Systems:  Review of Systems  Constitutional:  Positive for fatigue.  Respiratory:  Positive for shortness of breath.   Musculoskeletal:  Negative for gait problem.  Skin:  Negative for rash.  Psychiatric/Behavioral:         Please refer to HPI  Medications: I have reviewed the patient's current medications.  Current Outpatient Medications  Medication Sig Dispense Refill   aspirin-acetaminophen-caffeine (EXCEDRIN MIGRAINE) 250-250-65 MG tablet Take 2 tablets by mouth daily as needed for headache or migraine.     furosemide (LASIX) 20 MG tablet TAKE 1 TABLET BY MOUTH TWO TIMES A WEEK  26 tablet 5   gabapentin (NEURONTIN) 300 MG capsule Take 1 capsule (300 mg total) by mouth 3 (three) times daily. 270 capsule 0   Iron-FA-B Cmp-C-Biot-Probiotic (FUSION PLUS) CAPS One capsule twice daily with orange juice. Stop your Ferrous sulfate. (Patient taking differently: Take 1 capsule by mouth 2 (two) times daily. with orange juice. Stop your Ferrous sulfate.) 30 capsule 1   levothyroxine (SYNTHROID) 100 MCG tablet TAKE ONE TABLET BY MOUTH DAILY (Patient taking differently: Take 100 mcg by mouth daily before breakfast. Take with 112 mcg dose to equal 212 mcg daily.) 90 tablet 0   levothyroxine (SYNTHROID) 112 MCG tablet Take 112 mcg by mouth daily before breakfast. Take with the 100 mcg dose to equal 212 mcg daily     methotrexate (RHEUMATREX) 2.5 MG tablet Take 2 tablets (5 mg total) by mouth once a week. Caution:Chemotherapy. Protect from light. 16 tablet 2   Multiple Vitamins-Minerals (MULTIVITAMIN WITH MINERALS) tablet Take 1 tablet by mouth daily.     omeprazole (PRILOSEC) 20 MG capsule TAKE TWO CAPSULES BY MOUTH DAILY 60 capsule 0   potassium chloride SA (KLOR-CON) 20 MEQ tablet Take 1 tablet (20 mEq total) by mouth 2 (two) times a week. 26 tablet 0   propranolol (INDERAL) 20 MG tablet Take 1 tablet (20 mg total) by mouth 3 (three) times daily. 270 tablet 3   topiramate (TOPAMAX) 50 MG tablet Start with 50mg (1 tab) at bedtime. In 1 week increase to 100mg (2 tabs) at bedtime. (Patient taking differently: Take 100 mg by mouth at bedtime.) 180 tablet 6   Vitamin D, Ergocalciferol, (DRISDOL) 1.25 MG (50000 UNIT) CAPS capsule Take 1 capsule (50,000 Units total) by mouth 2 (two) times a week. 10 capsule 0   lamoTRIgine (LAMICTAL) 200 MG tablet Take 1 tablet (200 mg total) by mouth daily. 90 tablet 0   LORazepam (ATIVAN) 0.5 MG tablet Take 1 tablet (0.5 mg total) by mouth every 8 (eight) hours as needed for anxiety. 30 tablet 1   sertraline (ZOLOFT) 100 MG tablet Take 2 tablets (200 mg total) by  mouth daily. 180 tablet 1   traZODone (DESYREL) 50 MG tablet Take 1/2-2 po QHS prn insomnia 60 tablet 1   No current facility-administered medications for this visit.    Medication Side Effects: None  Allergies:  Allergies  Allergen Reactions   Augmentin [Amoxicillin-Pot Clavulanate] Hives    Past Medical History:  Diagnosis Date   Acid reflux    ADD (attention deficit disorder)    Anemia    Anxiety    Arthritis    left ankle   Back pain    Chest pain    Complication of anesthesia    Depression    Dyspnea    with exertion   Exercise-induced asthma    as a child   Fatty liver    History of blood transfusion    History of chicken pox    History of kidney stones    passed    Hypothyroidism    Joint pain    Lower extremity edema    Major depressive disorder    Migraines    chronic   Palpitations  Pneumonia    x 2   PONV (postoperative nausea and vomiting)    nausea   RLS (restless legs syndrome)    Sleep apnea 2021   Thyroid disease    hypothyroid    Past Medical History, Surgical history, Social history, and Family history were reviewed and updated as appropriate.   Please see review of systems for further details on the patient's review from today.   Objective:   Physical Exam:  There were no vitals taken for this visit. Physical Exam Neurological:     Mental Status: She is alert and oriented to person, place, and time.     Cranial Nerves: No dysarthria.  Psychiatric:        Attention and Perception: Attention and perception normal.        Mood and Affect: Mood is anxious and depressed.        Speech: Speech normal.        Behavior: Behavior is cooperative.        Thought Content: Thought content normal. Thought content is not paranoid or delusional. Thought content does not include homicidal or suicidal ideation. Thought content does not include homicidal or suicidal plan.        Cognition and Memory: Cognition and memory normal.         Judgment: Judgment normal.     Comments: Insight intact  Physical Exam  Lab Review:     Component Value Date/Time   NA 138 07/26/2020 1423   K 4.2 07/26/2020 1423   CL 103 07/26/2020 1423   CO2 29 07/26/2020 1423   GLUCOSE 84 07/26/2020 1423   BUN 10 07/26/2020 1423   CREATININE 0.88 07/26/2020 1423   CREATININE 0.74 09/30/2019 1047   CREATININE 0.75 07/06/2016 1534   CALCIUM 9.3 07/26/2020 1423   PROT 7.6 07/26/2020 1423   ALBUMIN 4.2 07/26/2020 1423   AST 30 07/26/2020 1423   AST 27 09/30/2019 1047   ALT 20 07/26/2020 1423   ALT 14 09/30/2019 1047   ALKPHOS 77 07/26/2020 1423   BILITOT 0.6 07/26/2020 1423   BILITOT 0.4 09/30/2019 1047   GFRNONAA >60 06/28/2020 0703   GFRNONAA >60 09/30/2019 1047   GFRAA >60 09/30/2019 1047       Component Value Date/Time   WBC 7.3 08/23/2020 1106   WBC 12.4 (H) 06/28/2020 0703   RBC 4.78 08/23/2020 1106   RBC 4.85 08/23/2020 1105   HGB 14.0 08/23/2020 1106   HGB 9.3 (L) 08/26/2019 1045   HCT 42.4 08/23/2020 1106   HCT 33.4 (L) 08/26/2019 1045   PLT 177 08/23/2020 1106   PLT 234 08/26/2019 1045   MCV 88.7 08/23/2020 1106   MCV 72 (L) 08/26/2019 1045   MCH 29.3 08/23/2020 1106   MCHC 33.0 08/23/2020 1106   RDW 14.7 08/23/2020 1106   RDW 15.4 08/26/2019 1045   LYMPHSABS 1.7 08/23/2020 1106   MONOABS 0.4 08/23/2020 1106   EOSABS 0.2 08/23/2020 1106   BASOSABS 0.0 08/23/2020 1106    No results found for: POCLITH, LITHIUM   No results found for: PHENYTOIN, PHENOBARB, VALPROATE, CBMZ   .res Assessment: Plan:    Patient seen for 30 minutes and time spent counseling patient regarding treatment options for anxiety and sleep disturbance.  Patient reports that gabapentin has been helpful for her insomnia in the past and asks about resuming gabapentin for RLS and sleep disturbance.  Discussed that gabapentin is also used off label for generalized anxiety and social anxiety.  Discussed  restarting gabapentin and changing to 3 times  daily dosing to improve RLS throughout the daytime.  Patient agrees to starting gabapentin 300 mg 3 times daily for anxiety and RLS. Continue lamotrigine 200 mg daily for mood and anxiety. Continue sertraline 200 mg daily for anxiety and depression. Continue lorazepam as needed for anxiety. Will remove armodafinil from medication list due to adverse effects. Recommend continuing psychotherapy. Patient to follow-up in 6 weeks or sooner if clinically indicated. Patient advised to contact office with any questions, adverse effects, or acute worsening in signs and symptoms.   Margaret Golden was seen today for depression, anxiety and sleeping problem.  Diagnoses and all orders for this visit:  RLS (restless legs syndrome) -     gabapentin (NEURONTIN) 300 MG capsule; Take 1 capsule (300 mg total) by mouth 3 (three) times daily.  Major depression, recurrent, chronic (HCC) -     gabapentin (NEURONTIN) 300 MG capsule; Take 1 capsule (300 mg total) by mouth 3 (three) times daily. -     lamoTRIgine (LAMICTAL) 200 MG tablet; Take 1 tablet (200 mg total) by mouth daily. -     sertraline (ZOLOFT) 100 MG tablet; Take 2 tablets (200 mg total) by mouth daily.  Social phobia -     gabapentin (NEURONTIN) 300 MG capsule; Take 1 capsule (300 mg total) by mouth 3 (three) times daily. -     sertraline (ZOLOFT) 100 MG tablet; Take 2 tablets (200 mg total) by mouth daily.    Please see After Visit Summary for patient specific instructions.  Future Appointments  Date Time Provider Department Center  09/27/2020  1:00 PM [facility] PFT ROOM [facility] None  09/27/2020  2:00 PM Martina Sinner, MD [facility] None  10/10/2020 11:00 AM Alois Cliche, PA-C [facility] None  11/02/2020  1:30 PM Corie Chiquito, PMHNP [facility] None  12/08/2020  1:00 PM Tobb, Kardie, DO [facility] None  02/14/2021  1:45 PM [facility] None  02/14/2021  2:15 PM [place], Brand Males, NP [facility] None  02/24/2021 11:30 AM [facility]  [facility] None  02/24/2021 11:45 AM [place], Brand Males, NP [facility] None  02/27/2021  3:00 PM Butch Penny, NP [facility] None    No orders of the defined types were placed in this encounter.   -------------------------------

## 2020-09-27 ENCOUNTER — Other Ambulatory Visit: Payer: Self-pay

## 2020-09-27 ENCOUNTER — Ambulatory Visit (INDEPENDENT_AMBULATORY_CARE_PROVIDER_SITE_OTHER): Payer: BC Managed Care – PPO | Admitting: Pulmonary Disease

## 2020-09-27 ENCOUNTER — Encounter: Payer: Self-pay | Admitting: Pulmonary Disease

## 2020-09-27 VITALS — BP 120/80 | HR 96 | Temp 98.2°F | Ht 67.5 in | Wt 367.0 lb

## 2020-09-27 DIAGNOSIS — D869 Sarcoidosis, unspecified: Secondary | ICD-10-CM

## 2020-09-27 DIAGNOSIS — G4730 Sleep apnea, unspecified: Secondary | ICD-10-CM | POA: Diagnosis not present

## 2020-09-27 LAB — PULMONARY FUNCTION TEST
DL/VA % pred: 109 %
DL/VA: 4.94 ml/min/mmHg/L
DLCO cor % pred: 119 %
DLCO cor: 29.61 ml/min/mmHg
DLCO unc % pred: 121 %
DLCO unc: 30.14 ml/min/mmHg
FEF 25-75 Post: 3.06 L/sec
FEF 25-75 Pre: 3.19 L/sec
FEF2575-%Change-Post: -3 %
FEF2575-%Pred-Post: 84 %
FEF2575-%Pred-Pre: 87 %
FEV1-%Change-Post: -1 %
FEV1-%Pred-Post: 101 %
FEV1-%Pred-Pre: 103 %
FEV1-Post: 3.54 L
FEV1-Pre: 3.6 L
FEV1FVC-%Change-Post: 1 %
FEV1FVC-%Pred-Pre: 92 %
FEV6-%Change-Post: -3 %
FEV6-%Pred-Post: 108 %
FEV6-%Pred-Pre: 112 %
FEV6-Post: 4.45 L
FEV6-Pre: 4.6 L
FEV6FVC-%Pred-Post: 101 %
FEV6FVC-%Pred-Pre: 101 %
FVC-%Change-Post: -3 %
FVC-%Pred-Post: 107 %
FVC-%Pred-Pre: 110 %
FVC-Post: 4.45 L
FVC-Pre: 4.6 L
Post FEV1/FVC ratio: 79 %
Post FEV6/FVC ratio: 100 %
Pre FEV1/FVC ratio: 78 %
Pre FEV6/FVC Ratio: 100 %

## 2020-09-27 MED ORDER — FLUTICASONE FUROATE-VILANTEROL 100-25 MCG/INH IN AEPB: 1.0000 | INHALATION_SPRAY | Freq: Every day | RESPIRATORY_TRACT | 0 refills | Status: DC

## 2020-09-27 MED ORDER — METHOTREXATE 2.5 MG PO TABS: 10.0000 mg | ORAL_TABLET | ORAL | 2 refills | Status: DC

## 2020-09-27 NOTE — Progress Notes (Signed)
Patient seen in the office today and instructed on use of Breo 100.  Patient expressed understanding and demonstrated technique. 

## 2020-09-27 NOTE — Patient Instructions (Addendum)
We will message Dr. Richardean Chimera about the CPAP titration study  Start 4 tabs of methotrexate on Monday July 4th. Take 4 tablets again on July 11th and give [place] a call by July 15th and let us know if you are doing ok with new dosage. If you are doing ok, we will increase your dose to 6 tabs every Monday and this will be your maintenance dose.   Come back in 2 weeks for a lab draw.   Start Breo Ellipta 1 puff daily

## 2020-09-27 NOTE — Progress Notes (Signed)
PFT done today. 

## 2020-09-27 NOTE — Progress Notes (Signed)
TeleHealth Visit:  Due to the COVID-19 pandemic, this visit was completed with telemedicine (audio/video) technology to reduce patient and provider exposure as well as to preserve personal protective equipment.   Margaret Golden has verbally consented to this TeleHealth visit. The patient is located at home, the provider is located at the [facility] office. The participants in this visit include the listed provider and patient. The visit was conducted today via MyChart video.   Chief Complaint: OBESITY Margaret Golden is here to discuss her progress with her obesity treatment plan along with follow-up of her obesity related diagnoses. Margaret Golden is on the Category 4 Plan and states she is following her eating plan approximately (unsure)% of the time. Margaret Golden states she is doing 0 minutes 0 times per week.  Today's visit was #: 13 Starting weight: 367 lbs Starting date: 09/03/2019  Interim History: Margaret Golden changed to MyChart visit due to vomiting secondary to anxiety. She saw her Psychiatrist yesterday and will see her counselor next week. She does not remember what she ate the last 4 weeks, but she feels that it was most likely only once a day.  Subjective:   1. Vitamin D deficiency Margaret Golden is on Vit D, and she denies nausea, vomiting, or muscle weakness.  Assessment/Plan:   1. Vitamin D deficiency Low Vitamin D level contributes to fatigue and are associated with obesity, breast, and colon cancer. Margaret Golden agreed to continue taking prescription Vitamin D 50,000 IU twice weekly and will follow-up for routine testing of Vitamin D, at least 2-3 times per year to avoid over-replacement.  2. Class 3 severe obesity with serious comorbidity and body mass index (BMI) of 50.0 to 59.9 in adult, unspecified obesity type (HCC) Margaret Golden is currently in the action stage of change. As such, her goal is to continue with weight loss efforts. She has agreed to the Category 4 Plan.   Exercise goals: No exercise  has been prescribed at this time.  Behavioral modification strategies: increasing lean protein intake and no skipping meals.  Margaret Golden has agreed to follow-up with our clinic in 2 to 3 weeks. She was informed of the importance of frequent follow-up visits to maximize her success with intensive lifestyle modifications for her multiple health conditions.  Objective:   VITALS: Per patient if applicable, see vitals. GENERAL: Alert and in no acute distress. CARDIOPULMONARY: No increased WOB. Speaking in clear sentences.  PSYCH: Pleasant and cooperative. Speech normal rate and rhythm. Affect is appropriate. Insight and judgement are appropriate. Attention is focused, linear, and appropriate.  NEURO: Oriented as arrived to appointment on time with no prompting.   Lab Results  Component Value Date   CREATININE 0.88 07/26/2020   BUN 10 07/26/2020   NA 138 07/26/2020   K 4.2 07/26/2020   CL 103 07/26/2020   CO2 29 07/26/2020   Lab Results  Component Value Date   ALT 20 07/26/2020   AST 30 07/26/2020   ALKPHOS 77 07/26/2020   BILITOT 0.6 07/26/2020   Lab Results  Component Value Date   HGBA1C 4.9 09/03/2019   HGBA1C 5.0 07/30/2019   HGBA1C 4.6 07/06/2016   Lab Results  Component Value Date   INSULIN 29.2 (H) 09/03/2019   Lab Results  Component Value Date   TSH 0.66 11/20/2019   Lab Results  Component Value Date   CHOL 150 09/03/2019   HDL 39 (L) 09/03/2019   LDLCALC 89 09/03/2019   TRIG 120 09/03/2019   CHOLHDL 4 07/21/2019   Lab  Results  Component Value Date   WBC 7.3 08/23/2020   HGB 14.0 08/23/2020   HCT 42.4 08/23/2020   MCV 88.7 08/23/2020   PLT 177 08/23/2020   Lab Results  Component Value Date   IRON 113 08/23/2020   TIBC 304 08/23/2020   FERRITIN 72 08/23/2020    Attestation Statements:   Reviewed by clinician on day of visit: allergies, medications, problem list, medical history, surgical history, family history, social history, and previous encounter  notes.   Margaret Golden, am acting as transcriptionist for [employer], PA-C.  I have reviewed the above documentation for accuracy and completeness, and I agree with the above. Margaret Cliche, PA-C

## 2020-09-27 NOTE — Progress Notes (Signed)
Synopsis: Referred in 12/2019 by Sandford CrazeMelissa O'Sullivan, NP for mediastinal adenopathy  Subjective:   PATIENT ID: Margaret Golden GENDER: female DOB: 06/22/1990, MRN: [id number]   HPI  Chief Complaint  Patient presents with   Follow-up    PFT follow up and 2 month check up.     Margaret Golden is a 30 year old woman, non-smoker with history of anemia, hypertension and obesity who returns to pulmonary clinic for follow up of sarcoidosis.    She was started on methotrexate 5mg  weekly at last visit. She has tolerated the medication well thus far. She does not notice any change in her dyspnea on exertion. She continues to have intermittent chest pains.   Pulmonary function tests are normal overall but they show increased diffusion capacity.   OV 07/26/20 She developed chest discomfort and shortness of breath about 1 year ago. She was hospitalized for symptomatic anemia due to heavy menses. Her hemoglobin has remained in the normal range since but she continued to have the above symptoms. She was evaluated by Cardiology prior to hysteroscopy who performed an ECHO which was normal and a CTA coronary scan which showed mediastinal adenopathy and pulmonary nodules.   We have followed her thoracic adenopathy with serial CT chest scans which demonstrated progression of the adenopathy prompting a PET scan on 04/27/20 which demonstrated increased uptake in her lymph nodes and also the right upper lobe nodule. She was taken for EBUS on 03/31/20 with sampling of the station 7 lymph node with non-diagnostic results. She was then referred to Thoracic Surgery who performed mediastinoscopy on 06/27/20 with lymph node samples  with non-caseating granulomas consistent with sarcoidosis.     She denies any vision changes, joint pains or skin rashes. She continues to have exertional dyspnea, chest pressure and has started to wake up short of breath at night at times.   No family members with sarcoidosis.   Past  Medical History:  Diagnosis Date   Acid reflux    ADD (attention deficit disorder)    Anemia    Anxiety    Arthritis    left ankle   Back pain    Chest pain    Complication of anesthesia    Depression    Dyspnea    with exertion   Exercise-induced asthma    as a child   Fatty liver    History of blood transfusion    History of chicken pox    History of kidney stones    passed    Hypothyroidism    Joint pain    Lower extremity edema    Major depressive disorder    Migraines    chronic   Palpitations    Pneumonia    x 2   PONV (postoperative nausea and vomiting)    nausea   RLS (restless legs syndrome)    Sleep apnea 2021   Thyroid disease    hypothyroid     Family History  Problem Relation Age of Onset   Depression Mother    Obesity Mother    Depression Father    Sleep apnea Father    Obesity Father    Cancer Paternal Aunt        colon   Cancer Paternal Uncle        brain cancer   Diabetes Maternal Grandfather    Arthritis Maternal Grandfather    Arthritis Maternal Grandmother    Breast cancer Maternal Grandmother    Arthritis Paternal Grandmother  Arthritis Paternal Grandfather    Migraines Neg Hx      Social History   Socioeconomic History   Marital status: Single    Spouse name: Not on file   Number of children: 0   Years of education: 12   Highest education level: Not on file  Occupational History   Occupation: Conservation officer, historic buildings  Tobacco Use   Smoking status: Never   Smokeless tobacco: Never  Vaping Use   Vaping Use: Never used  Substance and Sexual Activity   Alcohol use: Not Currently    Alcohol/week: 0.0 standard drinks   Drug use: No   Sexual activity: Never    Comment: never sexually active  Other Topics Concern   Not on file  Social History Narrative   Unemployed   Lives alone --update 11/11/19   Seeking work   Only child   Dog 3   Lives in a one story house with father, has a cat-12/26/16-sjb      Caffeine: 2-4  cups/day   Social Determinants of Corporate investment banker Strain: Not on file  Food Insecurity: Not on file  Transportation Needs: Not on file  Physical Activity: Not on file  Stress: Not on file  Social Connections: Not on file  Intimate Partner Violence: Not on file     Allergies  Allergen Reactions   Augmentin [Amoxicillin-Pot Clavulanate] Hives     Outpatient Medications Prior to Visit  Medication Sig Dispense Refill   aspirin-acetaminophen-caffeine (EXCEDRIN MIGRAINE) 250-250-65 MG tablet Take 2 tablets by mouth daily as needed for headache or migraine.     furosemide (LASIX) 20 MG tablet TAKE 1 TABLET BY MOUTH TWO TIMES A WEEK 26 tablet 5   gabapentin (NEURONTIN) 300 MG capsule Take 1 capsule (300 mg total) by mouth 3 (three) times daily. 270 capsule 0   Iron-FA-B Cmp-C-Biot-Probiotic (FUSION PLUS) CAPS One capsule twice daily with orange juice. Stop your Ferrous sulfate. (Patient taking differently: Take 1 capsule by mouth 2 (two) times daily. with orange juice. Stop your Ferrous sulfate.) 30 capsule 1   lamoTRIgine (LAMICTAL) 200 MG tablet Take 1 tablet (200 mg total) by mouth daily. 90 tablet 0   levothyroxine (SYNTHROID) 100 MCG tablet TAKE ONE TABLET BY MOUTH DAILY (Patient taking differently: Take 100 mcg by mouth daily before breakfast. Take with 112 mcg dose to equal 212 mcg daily.) 90 tablet 0   levothyroxine (SYNTHROID) 112 MCG tablet Take 112 mcg by mouth daily before breakfast. Take with the 100 mcg dose to equal 212 mcg daily     LORazepam (ATIVAN) 0.5 MG tablet Take 1 tablet (0.5 mg total) by mouth every 8 (eight) hours as needed for anxiety. 30 tablet 1   Multiple Vitamins-Minerals (MULTIVITAMIN WITH MINERALS) tablet Take 1 tablet by mouth daily.     omeprazole (PRILOSEC) 20 MG capsule TAKE TWO CAPSULES BY MOUTH DAILY 60 capsule 0   potassium chloride SA (KLOR-CON) 20 MEQ tablet Take 1 tablet (20 mEq total) by mouth 2 (two) times a week. 26 tablet 0    propranolol (INDERAL) 20 MG tablet Take 1 tablet (20 mg total) by mouth 3 (three) times daily. 270 tablet 3   sertraline (ZOLOFT) 100 MG tablet Take 2 tablets (200 mg total) by mouth daily. 180 tablet 1   topiramate (TOPAMAX) 50 MG tablet Start with 50mg (1 tab) at bedtime. In 1 week increase to 100mg (2 tabs) at bedtime. (Patient taking differently: Take 100 mg by mouth at bedtime.) 180 tablet 6  traZODone (DESYREL) 50 MG tablet Take 1/2-2 po QHS prn insomnia 60 tablet 1   Vitamin D, Ergocalciferol, (DRISDOL) 1.25 MG (50000 UNIT) CAPS capsule Take 1 capsule (50,000 Units total) by mouth 2 (two) times a week. 10 capsule 0   methotrexate (RHEUMATREX) 2.5 MG tablet Take 2 tablets (5 mg total) by mouth once a week. Caution:Chemotherapy. Protect from light. 16 tablet 2   No facility-administered medications prior to visit.    Review of Systems  Constitutional:  Positive for malaise/fatigue. Negative for chills, fever and weight loss.  HENT:  Negative for congestion and sore throat.   Respiratory:  Positive for shortness of breath. Negative for cough, hemoptysis, sputum production and wheezing.   Cardiovascular:  Positive for chest pain. Negative for palpitations, orthopnea, claudication, leg swelling and PND.  Gastrointestinal:  Negative for abdominal pain, heartburn and vomiting.  Genitourinary: Negative.   Musculoskeletal:  Negative for joint pain and myalgias.  Neurological:  Negative for dizziness, focal weakness and headaches.  Endo/Heme/Allergies: Negative.   Psychiatric/Behavioral: Negative.     Objective:   Vitals:   09/27/20 1345  BP: 120/80  Pulse: 96  Temp: 98.2 F (36.8 C)  TempSrc: Oral  SpO2: 98%  Weight: (!) 367 lb (166.5 kg)  Height: 5' 7.5" (1.715 m)     Physical Exam Constitutional:      General: She is not in acute distress.    Appearance: She is obese. She is not ill-appearing.  HENT:     Head: Normocephalic and atraumatic.  Eyes:     General: No scleral  icterus.    Conjunctiva/sclera: Conjunctivae normal.     Pupils: Pupils are equal, round, and reactive to light.  Cardiovascular:     Rate and Rhythm: Normal rate and regular rhythm.     Pulses: Normal pulses.     Heart sounds: Normal heart sounds. No murmur heard. Pulmonary:     Effort: Pulmonary effort is normal.     Breath sounds: Decreased breath sounds present. No wheezing, rhonchi or rales.  Abdominal:     General: Bowel sounds are normal.     Palpations: Abdomen is soft.  Musculoskeletal:     Right lower leg: No edema.     Left lower leg: No edema.  Lymphadenopathy:     Cervical: No cervical adenopathy.  Skin:    General: Skin is warm and dry.  Neurological:     General: No focal deficit present.     Mental Status: She is alert.  Psychiatric:        Mood and Affect: Mood normal.        Behavior: Behavior normal.        Thought Content: Thought content normal.        Judgment: Judgment normal.   CBC    Component Value Date/Time   WBC 7.3 08/23/2020 1106   WBC 12.4 (H) 06/28/2020 0703   RBC 4.78 08/23/2020 1106   RBC 4.85 08/23/2020 1105   HGB 14.0 08/23/2020 1106   HGB 9.3 (L) 08/26/2019 1045   HCT 42.4 08/23/2020 1106   HCT 33.4 (L) 08/26/2019 1045   PLT 177 08/23/2020 1106   PLT 234 08/26/2019 1045   MCV 88.7 08/23/2020 1106   MCV 72 (L) 08/26/2019 1045   MCH 29.3 08/23/2020 1106   MCHC 33.0 08/23/2020 1106   RDW 14.7 08/23/2020 1106   RDW 15.4 08/26/2019 1045   LYMPHSABS 1.7 08/23/2020 1106   MONOABS 0.4 08/23/2020 1106   EOSABS 0.2 08/23/2020  1106   BASOSABS 0.0 08/23/2020 1106   BMP Latest Ref Rng & Units 07/26/2020 06/28/2020 06/23/2020  Glucose 70 - 99 mg/dL 84 194(R) 740(C)  BUN 6 - 23 mg/dL 10 9 9   Creatinine 0.40 - 1.20 mg/dL 1.44 8.18  Sodium 135 - 145 mEq/L 138 134(L) 136  Potassium 3.5 - 5.1 mEq/L 4.2 4.5 3.8  Chloride 96 - 112 mEq/L 103 105 103  CO2 19 - 32 mEq/L 29 21(L) 24  Calcium 8.4 - 10.5 mg/dL 9.3 5.63) 1.4(H)   Chest  imaging: PET Scan 04/27/20 Multifocal hypermetabolic nodal activity throughout the mediastinum, hilar regions and porta hepatis. Multiple nonspecific small pulmonary nodules are again noted bilaterally, stable from recent studies, but also new from baseline exam. The largest nodule in the right upper lobe is hypermetabolic  CT Chest w contrast 04/29/20 Mild interval progression of thoracic adenopathy. Numerous pulmonary nodules that remain stable  CTA 12/17/19 Mediastinal and hilar adenopathy similar to scan on 11/23/19 Stable scattered pulmonary nodules  CTA Coronary 11/23/19 Enlarged subcarinal node. Bilateral hilar and infrahilar boderlinr to mild adenopathy. Bilateral pulmonary nodules, subpleural distribution.   CTA Chest 06/29/19 Mediastinum/Nodes: No enlarged mediastinal, hilar, or axillary lymph nodes. Thyroid gland, trachea, and esophagus demonstrate no significant findings. Lungs/Pleura: No acute pleural or parenchymal lung disease. Central airways are widely patent.  PFT: PFT Results Latest Ref Rng & Units 09/27/2020  FVC-Pre L 4.60  FVC-Predicted Pre % 110  FVC-Post L 4.45  FVC-Predicted Post % 107  Pre FEV1/FVC % % 78  Post FEV1/FCV % % 79  FEV1-Pre L 3.60  FEV1-Predicted Pre % 103  FEV1-Post L 3.54  DLCO uncorrected ml/min/mmHg 30.14  DLCO UNC% % 121  DLCO corrected ml/min/mmHg 29.61  DLCO COR %Predicted % 119  DLVA Predicted % 109  PFT 2022: Normal Spirometry and lung volumes, increased diffusion  Labs: 07/26/20 - Hep B & C negative  Path: 06/27/20 - Surgical Path: LN 7, 10R show well formed granulomas concerning for sarcoidosis. Flow pathology negative.  Echo 09/14/19: EF 60-65%, Normal LV function. RV systolic function is normal. No valvular abnormalities.   Sleep Study: 01/2016 - AHI 3.5/hr -No central sleep apneas - Minimal to no oxygen desaturations    Assessment & Plan:   Sarcoidosis - Plan: methotrexate (RHEUMATREX) 2.5 MG tablet  Class 3  severe obesity due to excess calories with serious comorbidity in adult, unspecified BMI (HCC)  Severe sleep apnea  Discussion: Margaret Golden is a 30 year old woman, non-smoker with history of anemia, hypertension and obesity who returns to pulmonary clinic for chest discomfort, shortness of breath and mediastinal lymphadenopathy found to have sarcoidosis via mediastinoscopy on 06/27/20.   She is to continue methotrexate therapy and we will increase her dose to 10mg  weekly (4 tabs). She is to take 10mg  over the next 2 week and we will repeat blood work in mid July. She is to also call and let know if she is doing ok on the increased dose and then we will plan to increase her to a goal dose of 15mg  weekly.   We discussed methotrexate is a teratogen and that she should seek birth control therapy if sexually active.  We will trial her on Breo Ellipta 100 daily for possible small airways disease given her increased diffusion capacity on PFTs today. She is to let August know if her symptoms improve and we will sed in a prescription for breo 200 as this is what is covered by her  insurance.  Follow up in 3 months.   Melody Comas, MD [facility] Office: [phone]  Breo 200 covered by insruance   Current Outpatient Medications:    aspirin-acetaminophen-caffeine (EXCEDRIN MIGRAINE) 250-250-65 MG tablet, Take 2 tablets by mouth daily as needed for headache or migraine., Disp: , Rfl:    furosemide (LASIX) 20 MG tablet, TAKE 1 TABLET BY MOUTH TWO TIMES A WEEK, Disp: 26 tablet, Rfl: 5   gabapentin (NEURONTIN) 300 MG capsule, Take 1 capsule (300 mg total) by mouth 3 (three) times daily., Disp: 270 capsule, Rfl: 0   Iron-FA-B Cmp-C-Biot-Probiotic (FUSION PLUS) CAPS, One capsule twice daily with orange juice. Stop your Ferrous sulfate. (Patient taking differently: Take 1 capsule by mouth 2 (two) times daily. with orange juice. Stop your Ferrous sulfate.), Disp: 30 capsule, Rfl:  1   lamoTRIgine (LAMICTAL) 200 MG tablet, Take 1 tablet (200 mg total) by mouth daily., Disp: 90 tablet, Rfl: 0   levothyroxine (SYNTHROID) 100 MCG tablet, TAKE ONE TABLET BY MOUTH DAILY (Patient taking differently: Take 100 mcg by mouth daily before breakfast. Take with 112 mcg dose to equal 212 mcg daily.), Disp: 90 tablet, Rfl: 0   levothyroxine (SYNTHROID) 112 MCG tablet, Take 112 mcg by mouth daily before breakfast. Take with the 100 mcg dose to equal 212 mcg daily, Disp: , Rfl:    LORazepam (ATIVAN) 0.5 MG tablet, Take 1 tablet (0.5 mg total) by mouth every 8 (eight) hours as needed for anxiety., Disp: 30 tablet, Rfl: 1   Multiple Vitamins-Minerals (MULTIVITAMIN WITH MINERALS) tablet, Take 1 tablet by mouth daily., Disp: , Rfl:    omeprazole (PRILOSEC) 20 MG capsule, TAKE TWO CAPSULES BY MOUTH DAILY, Disp: 60 capsule, Rfl: 0   potassium chloride SA (KLOR-CON) 20 MEQ tablet, Take 1 tablet (20 mEq total) by mouth 2 (two) times a week., Disp: 26 tablet, Rfl: 0   propranolol (INDERAL) 20 MG tablet, Take 1 tablet (20 mg total) by mouth 3 (three) times daily., Disp: 270 tablet, Rfl: 3   sertraline (ZOLOFT) 100 MG tablet, Take 2 tablets (200 mg total) by mouth daily., Disp: 180 tablet, Rfl: 1   topiramate (TOPAMAX) 50 MG tablet, Start with (1 tab) at bedtime. In 1 week increase to (2 tabs) at bedtime. (Patient taking differently: Take 100 mg by mouth at bedtime.), Disp: 180 tablet, Rfl: 6   traZODone (DESYREL) 50 MG tablet, Take 1/2-2 po QHS prn insomnia, Disp: 60 tablet, Rfl: 1   Vitamin D, Ergocalciferol, (DRISDOL) 1.25 MG (50000 UNIT) CAPS capsule, Take 1 capsule (50,000 Units total) by mouth 2 (two) times a week., Disp: 10 capsule, Rfl: 0   methotrexate (RHEUMATREX) 2.5 MG tablet, Take 4 tablets (10 mg total) by mouth once a week. Caution:Chemotherapy. Protect from light., Disp: 16 tablet, Rfl: 2

## 2020-09-28 DIAGNOSIS — F4323 Adjustment disorder with mixed anxiety and depressed mood: Secondary | ICD-10-CM | POA: Diagnosis not present

## 2020-09-29 ENCOUNTER — Other Ambulatory Visit: Payer: Self-pay | Admitting: Neurology

## 2020-09-29 DIAGNOSIS — R51 Headache with orthostatic component, not elsewhere classified: Secondary | ICD-10-CM

## 2020-09-29 DIAGNOSIS — G4733 Obstructive sleep apnea (adult) (pediatric): Secondary | ICD-10-CM

## 2020-09-29 DIAGNOSIS — R519 Headache, unspecified: Secondary | ICD-10-CM

## 2020-09-29 DIAGNOSIS — G2581 Restless legs syndrome: Secondary | ICD-10-CM

## 2020-10-05 DIAGNOSIS — F4323 Adjustment disorder with mixed anxiety and depressed mood: Secondary | ICD-10-CM | POA: Diagnosis not present

## 2020-10-10 ENCOUNTER — Ambulatory Visit (INDEPENDENT_AMBULATORY_CARE_PROVIDER_SITE_OTHER): Payer: BC Managed Care – PPO | Admitting: Physician Assistant

## 2020-10-10 ENCOUNTER — Encounter (INDEPENDENT_AMBULATORY_CARE_PROVIDER_SITE_OTHER): Payer: Self-pay

## 2020-10-11 ENCOUNTER — Other Ambulatory Visit: Payer: Self-pay

## 2020-10-11 ENCOUNTER — Ambulatory Visit (INDEPENDENT_AMBULATORY_CARE_PROVIDER_SITE_OTHER): Payer: BC Managed Care – PPO | Admitting: Family

## 2020-10-11 VITALS — BP 125/78 | HR 105 | Resp 20 | Ht 67.5 in | Wt 366.0 lb

## 2020-10-11 DIAGNOSIS — E039 Hypothyroidism, unspecified: Secondary | ICD-10-CM

## 2020-10-11 DIAGNOSIS — F4323 Adjustment disorder with mixed anxiety and depressed mood: Secondary | ICD-10-CM | POA: Diagnosis not present

## 2020-10-11 DIAGNOSIS — H6692 Otitis media, unspecified, left ear: Secondary | ICD-10-CM | POA: Insufficient documentation

## 2020-10-11 DIAGNOSIS — G4733 Obstructive sleep apnea (adult) (pediatric): Secondary | ICD-10-CM | POA: Diagnosis not present

## 2020-10-11 MED ORDER — AZITHROMYCIN 250 MG PO TABS: ORAL_TABLET | ORAL | 0 refills | Status: AC

## 2020-10-11 NOTE — Assessment & Plan Note (Signed)
New.  Suspect some associated eustachian tube dysfunction. Advised pt as follows:  Please add flonase 2 sprays each nostril once daily. Clartin 10mg  once daily. Begin Zpak for left ear infection.

## 2020-10-11 NOTE — Patient Instructions (Signed)
Please add flonase 2 sprays each nostril once daily. Clartin 10mg  once daily. Begin Zpak for left ear infection.

## 2020-10-11 NOTE — Progress Notes (Signed)
Subjective:   By signing my name below, I, [name], attest that this documentation has been prepared under the direction and in the presence of [name] NP. 10/11/2020     Patient ID: Margaret Golden, female    DOB: 1990-09-19, 29 y.o.   MRN: [id number]  Chief Complaint  Patient presents with   Ear Pain    Both ears , onset: 2.5 weeks -- headache & nausea    HPI Patient is in today for a office visit. She complains of on and off pain in both ears. The left ear bothers her more than the right. When the ear pain worsens she gets headaches and nausea. She takes OTC Excedrin migraine to manage her headaches. She does not take any medication to manage her ear pain. She denies having any sinus congestion at this time.  Allergies- She is not taking any medication to manage her allergies at this time. She reports that her allergies are mild this time of the year. Weight- She continues using healthy weight and wellness to help manage her weight loss. She reports going down a pant size recently.  Health Maintenance Due  Topic Date Due   Pneumococcal Vaccine 25-56 Years old (1 - PCV) Never done   COVID-19 Vaccine (3 - Pfizer risk series) 08/17/2019    Past Medical History:  Diagnosis Date   Acid reflux    ADD (attention deficit disorder)    Anemia    Anxiety    Arthritis    left ankle   Back pain    Chest pain    Complication of anesthesia    Depression    Dyspnea    with exertion   Exercise-induced asthma    as a child   Fatty liver    History of blood transfusion    History of chicken pox    History of kidney stones    passed    Hypothyroidism    Joint pain    Lower extremity edema    Major depressive disorder    Migraines    chronic   Palpitations    Pneumonia    x 2   PONV (postoperative nausea and vomiting)    nausea   RLS (restless legs syndrome)    Sleep apnea 2021   Thyroid disease    hypothyroid    Past Surgical History:  Procedure  Laterality Date   BRONCHIAL NEEDLE ASPIRATION BIOPSY  03/31/2020   Procedure: BRONCHIAL NEEDLE ASPIRATION BIOPSIES;  Surgeon: Josephine Igo, DO;  Location: [facility];  Service: Pulmonary;;   BRONCHIAL WASHINGS  03/31/2020   Procedure: BRONCHIAL WASHINGS;  Surgeon: Josephine Igo, DO;  Location: [facility];  Service: Pulmonary;;   DILATATION & CURETTAGE/HYSTEROSCOPY WITH MYOSURE N/A 03/22/2020   Procedure: DILATATION & CURETTAGE/HYSTEROSCOPY WITH MYOSURE;  Surgeon: Romualdo Bolk, MD;  Location: [facility] OR;  Service: Gynecology;  Laterality: N/A;   INTERCOSTAL NERVE BLOCK Right 06/27/2020   Procedure: INTERCOSTAL NERVE BLOCK;  Surgeon: Corliss Skains, MD;  Location: [facility] OR;  Service: Thoracic;  Laterality: Right;   INTRAUTERINE DEVICE (IUD) INSERTION N/A 03/22/2020   Procedure: INTRAUTERINE DEVICE (IUD) INSERTION;  Surgeon: Romualdo Bolk, MD;  Location: [facility] OR;  Service: Gynecology;  Laterality: N/A;  Mirena IUD insertion   KNEE ARTHROSCOPY Right 2007   LYMPH NODE BIOPSY Right 06/27/2020   Procedure: LYMPH NODE BIOPSY;  Surgeon: Corliss Skains, MD;  Location: [facility] OR;  Service: Thoracic;  Laterality: Right;   TONSILLECTOMY  2010   TONSILLECTOMY     VIDEO BRONCHOSCOPY WITH ENDOBRONCHIAL ULTRASOUND N/A 03/31/2020   Procedure: VIDEO BRONCHOSCOPY WITH ENDOBRONCHIAL ULTRASOUND;  Surgeon: Josephine Igo, DO;  Location: [facility];  Service: Pulmonary;  Laterality: N/A;   WISDOM TOOTH EXTRACTION     WISDOM TOOTH EXTRACTION Bilateral    upper and lower    Family History  Problem Relation Age of Onset   Depression Mother    Obesity Mother    Depression Father    Sleep apnea Father    Obesity Father    Cancer Paternal Aunt        colon   Cancer Paternal Uncle        brain cancer   Diabetes Maternal Grandfather    Arthritis Maternal Grandfather    Arthritis Maternal Grandmother    Breast cancer Maternal Grandmother    Arthritis Paternal Grandmother    Arthritis  Paternal Grandfather    Migraines Neg Hx     Social History   Socioeconomic History   Marital status: Single    Spouse name: Not on file   Number of children: 0   Years of education: 12   Highest education level: Not on file  Occupational History   Occupation: Conservation officer, historic buildings  Tobacco Use   Smoking status: Never   Smokeless tobacco: Never  Vaping Use   Vaping Use: Never used  Substance and Sexual Activity   Alcohol use: Not Currently    Alcohol/week: 0.0 standard drinks   Drug use: No   Sexual activity: Never    Comment: never sexually active  Other Topics Concern   Not on file  Social History Narrative   Unemployed   Lives alone --update 11/11/19   Seeking work   Only child   Dog 3   Lives in a one story house with father, has a cat-12/26/16-sjb      Caffeine: 2-4 cups/day   Social Determinants of Corporate investment banker Strain: Not on file  Food Insecurity: Not on file  Transportation Needs: Not on file  Physical Activity: Not on file  Stress: Not on file  Social Connections: Not on file  Intimate Partner Violence: Not on file    Outpatient Medications Prior to Visit  Medication Sig Dispense Refill   aspirin-acetaminophen-caffeine (EXCEDRIN MIGRAINE) 250-250-65 MG tablet Take 2 tablets by mouth daily as needed for headache or migraine.     fluticasone furoate-vilanterol (BREO ELLIPTA) 100-25 MCG/INH AEPB Inhale 1 puff into the lungs daily. 28 each 0   furosemide (LASIX) 20 MG tablet TAKE 1 TABLET BY MOUTH TWO TIMES A WEEK 26 tablet 5   gabapentin (NEURONTIN) 300 MG capsule Take 1 capsule (300 mg total) by mouth 3 (three) times daily. 270 capsule 0   Iron-FA-B Cmp-C-Biot-Probiotic (FUSION PLUS) CAPS One capsule twice daily with orange juice. Stop your Ferrous sulfate. (Patient taking differently: Take 1 capsule by mouth 2 (two) times daily. with orange juice. Stop your Ferrous sulfate.) 30 capsule 1   lamoTRIgine (LAMICTAL) 200 MG tablet Take 1 tablet  (200 mg total) by mouth daily. 90 tablet 0   levothyroxine (SYNTHROID) 100 MCG tablet TAKE ONE TABLET BY MOUTH DAILY (Patient taking differently: Take 100 mcg by mouth daily before breakfast. Take with 112 mcg dose to equal 212 mcg daily.) 90 tablet 0   levothyroxine (SYNTHROID) 112 MCG tablet Take 112 mcg by mouth daily before breakfast. Take with the 100 mcg dose to equal 212 mcg daily  LORazepam (ATIVAN) 0.5 MG tablet Take 1 tablet (0.5 mg total) by mouth every 8 (eight) hours as needed for anxiety. 30 tablet 1   methotrexate (RHEUMATREX) 2.5 MG tablet Take 4 tablets (10 mg total) by mouth once a week. Caution:Chemotherapy. Protect from light. 16 tablet 2   Multiple Vitamins-Minerals (MULTIVITAMIN WITH MINERALS) tablet Take 1 tablet by mouth daily.     omeprazole (PRILOSEC) 20 MG capsule TAKE TWO CAPSULES BY MOUTH DAILY 60 capsule 0   potassium chloride SA (KLOR-CON) 20 MEQ tablet Take 1 tablet (20 mEq total) by mouth 2 (two) times a week. 26 tablet 0   propranolol (INDERAL) 20 MG tablet Take 1 tablet (20 mg total) by mouth 3 (three) times daily. 270 tablet 3   sertraline (ZOLOFT) 100 MG tablet Take 2 tablets (200 mg total) by mouth daily. 180 tablet 1   topiramate (TOPAMAX) 50 MG tablet Start with 50mg (1 tab) at bedtime. In 1 week increase to 100mg (2 tabs) at bedtime. (Patient taking differently: Take 100 mg by mouth at bedtime.) 180 tablet 6   traZODone (DESYREL) 50 MG tablet Take 1/2-2 po QHS prn insomnia 60 tablet 1   Vitamin D, Ergocalciferol, (DRISDOL) 1.25 MG (50000 UNIT) CAPS capsule Take 1 capsule (50,000 Units total) by mouth 2 (two) times a week. 10 capsule 0   No facility-administered medications prior to visit.    Allergies  Allergen Reactions   Augmentin [Amoxicillin-Pot Clavulanate] Hives    Review of Systems  HENT:  Positive for ear pain (bilateral). Negative for congestion.   Gastrointestinal:  Positive for nausea (headache and ear pain).  Neurological:  Positive for  headaches (with ear pain).      Objective:    Physical Exam Constitutional:      General: She is not in acute distress.    Appearance: Normal appearance. She is not ill-appearing.  HENT:     Head: Normocephalic and atraumatic.     Right Ear: External ear normal. Tympanic membrane is retracted.     Left Ear: External ear normal. Tympanic membrane is erythematous (mild) and bulging.     Ears:     Comments: Fluid noted behind left TM. Eyes:     Extraocular Movements: Extraocular movements intact.     Pupils: Pupils are equal, round, and reactive to light.  Cardiovascular:     Rate and Rhythm: Normal rate and regular rhythm.     Pulses: Normal pulses.     Heart sounds: Normal heart sounds. No murmur heard.   No gallop.  Pulmonary:     Effort: Pulmonary effort is normal. No respiratory distress.     Breath sounds: Normal breath sounds. No wheezing, rhonchi or rales.  Skin:    General: Skin is warm and dry.  Neurological:     Mental Status: She is alert and oriented to person, place, and time.  Psychiatric:        Behavior: Behavior normal.    BP 125/78   Pulse (!) 105   Resp 20   Ht 5' 7.5" (1.715 m)   Wt (!) 366 lb (166 kg)   LMP 10/10/2020   SpO2 97%   BMI 56.48 kg/m  Wt Readings from Last 3 Encounters:  10/11/20 (!) 366 lb (166 kg)  09/27/20 (!) 367 lb (166.5 kg)  08/23/20 (!) 366 lb 0.6 oz (166 kg)       Assessment & Plan:   Problem List Items Addressed This Visit       Unprioritized  Left otitis media    New.  Suspect some associated eustachian tube dysfunction. Advised pt as follows:  Please add flonase 2 sprays each nostril once daily. Clartin 10mg  once daily. Begin Zpak for left ear infection.       Relevant Medications   azithromycin (ZITHROMAX) 250 MG tablet   Hypothyroidism - Primary    Clinically stable on synthroid. Continue synthroid.         Relevant Orders   TSH (Completed)     Meds ordered this encounter  Medications    azithromycin (ZITHROMAX) 250 MG tablet    Sig: Take 2 tablets on day 1, then 1 tablet daily on days 2 through 5    Dispense:  6 tablet    Refill:  0    Order Specific Question:   Supervising Provider    Answer:   Danise EdgeBLYTH, STACEY A [[id number]]    I, Sandford CrazeMelissa O'Sullivan NP, personally preformed the services described in this documentation.  All medical record entries made by the scribe were at my direction and in my presence.  I have reviewed the chart and discharge instructions (if applicable) and agree that the record reflects my personal performance and is accurate and complete. 10/11/2020   I,[name],acting as a Neurosurgeonscribe for Lemont FillersMelissa S O'Sullivan, NP.,have documented all relevant documentation on the behalf of Lemont FillersMelissa S O'Sullivan, NP,as directed by  Lemont FillersMelissa S O'Sullivan, NP while in the presence of Lemont FillersMelissa S O'Sullivan, NP.   Lemont FillersMelissa S O'Sullivan, NP

## 2020-10-11 NOTE — Assessment & Plan Note (Signed)
Clinically stable on synthroid. Continue synthroid.

## 2020-10-12 ENCOUNTER — Telehealth: Payer: Self-pay | Admitting: Family

## 2020-10-12 ENCOUNTER — Telehealth: Payer: Self-pay

## 2020-10-12 LAB — TSH: TSH: 10.32 u[IU]/mL — ABNORMAL HIGH (ref 0.35–5.50)

## 2020-10-12 NOTE — Telephone Encounter (Signed)
See mychart.  

## 2020-10-12 NOTE — Telephone Encounter (Signed)
Called and lvm to reschedule f/u appt for a sooner date to be CPAP compliant for insurance. I have on hold Sept 15 at 10:30am. If time works, please change. If time or date doesn't work we can look for another day on [employer] sch. We can book on a Thursday research time slots (ok per [place] to use in these situations) .

## 2020-10-13 ENCOUNTER — Telehealth: Payer: Self-pay | Admitting: Family

## 2020-10-13 DIAGNOSIS — E038 Other specified hypothyroidism: Secondary | ICD-10-CM

## 2020-10-13 NOTE — Telephone Encounter (Signed)
Please contact pt re: unread mychart message:    Margaret Golden,   Question- have you missed any synthroid doses?  Also, what dose have you been taking? I will need to increase the dose a little.   Thanks,   [employer]

## 2020-10-13 NOTE — Telephone Encounter (Signed)
Called pt and lvm to return call.  

## 2020-10-17 NOTE — Telephone Encounter (Signed)
Patient states she has been missing doses and also she is using 112 mg

## 2020-10-17 NOTE — Telephone Encounter (Signed)
Patient notified and lab appointment for 6 weeks , will drop orders

## 2020-10-18 DIAGNOSIS — F4323 Adjustment disorder with mixed anxiety and depressed mood: Secondary | ICD-10-CM | POA: Diagnosis not present

## 2020-10-25 DIAGNOSIS — F4323 Adjustment disorder with mixed anxiety and depressed mood: Secondary | ICD-10-CM | POA: Diagnosis not present

## 2020-10-31 ENCOUNTER — Other Ambulatory Visit: Payer: Self-pay | Admitting: Family

## 2020-10-31 MED ORDER — LEVOTHYROXINE SODIUM 100 MCG PO TABS: 100.0000 ug | ORAL_TABLET | Freq: Every day | ORAL | 0 refills | Status: DC

## 2020-11-02 ENCOUNTER — Encounter: Payer: Self-pay | Admitting: Psychiatry

## 2020-11-02 ENCOUNTER — Other Ambulatory Visit (INDEPENDENT_AMBULATORY_CARE_PROVIDER_SITE_OTHER): Payer: BC Managed Care – PPO

## 2020-11-02 ENCOUNTER — Other Ambulatory Visit: Payer: Self-pay

## 2020-11-02 ENCOUNTER — Ambulatory Visit (INDEPENDENT_AMBULATORY_CARE_PROVIDER_SITE_OTHER): Payer: BC Managed Care – PPO | Admitting: Psychiatry

## 2020-11-02 DIAGNOSIS — F4010 Social phobia, unspecified: Secondary | ICD-10-CM

## 2020-11-02 DIAGNOSIS — G2581 Restless legs syndrome: Secondary | ICD-10-CM

## 2020-11-02 DIAGNOSIS — D869 Sarcoidosis, unspecified: Secondary | ICD-10-CM

## 2020-11-02 DIAGNOSIS — E038 Other specified hypothyroidism: Secondary | ICD-10-CM | POA: Diagnosis not present

## 2020-11-02 DIAGNOSIS — F339 Major depressive disorder, recurrent, unspecified: Secondary | ICD-10-CM

## 2020-11-02 LAB — CBC WITH DIFFERENTIAL/PLATELET
Basophils Absolute: 0 10*3/uL (ref 0.0–0.1)
Basophils Relative: 0.4 % (ref 0.0–3.0)
Eosinophils Absolute: 0.2 10*3/uL (ref 0.0–0.7)
Eosinophils Relative: 2.8 % (ref 0.0–5.0)
HCT: 42.3 % (ref 36.0–46.0)
Hemoglobin: 14.2 g/dL (ref 12.0–15.0)
Lymphocytes Relative: 24.5 % (ref 12.0–46.0)
Lymphs Abs: 1.7 10*3/uL (ref 0.7–4.0)
MCHC: 33.5 g/dL (ref 30.0–36.0)
MCV: 91 fl (ref 78.0–100.0)
Monocytes Absolute: 0.4 10*3/uL (ref 0.1–1.0)
Monocytes Relative: 5 % (ref 3.0–12.0)
Neutro Abs: 4.8 10*3/uL (ref 1.4–7.7)
Neutrophils Relative %: 67.3 % (ref 43.0–77.0)
Platelets: 163 10*3/uL (ref 150.0–400.0)
RBC: 4.65 Mil/uL (ref 3.87–5.11)
RDW: 15.5 % (ref 11.5–15.5)
WBC: 7.1 10*3/uL (ref 4.0–10.5)

## 2020-11-02 LAB — TSH: TSH: 13.76 u[IU]/mL — ABNORMAL HIGH (ref 0.35–5.50)

## 2020-11-02 LAB — COMPREHENSIVE METABOLIC PANEL
ALT: 20 U/L (ref 0–35)
AST: 29 U/L (ref 0–37)
Albumin: 4.3 g/dL (ref 3.5–5.2)
Alkaline Phosphatase: 85 U/L (ref 39–117)
BUN: 8 mg/dL (ref 6–23)
CO2: 23 mEq/L (ref 19–32)
Calcium: 8.7 mg/dL (ref 8.4–10.5)
Chloride: 104 mEq/L (ref 96–112)
Creatinine, Ser: 0.78 mg/dL (ref 0.40–1.20)
GFR: 101.76 mL/min (ref >60.00)
Glucose, Bld: 90 mg/dL (ref 70–99)
Potassium: 4 mEq/L (ref 3.5–5.1)
Sodium: 138 mEq/L (ref 135–145)
Total Bilirubin: 0.7 mg/dL (ref 0.2–1.2)
Total Protein: 7.3 g/dL (ref 6.0–8.3)

## 2020-11-02 MED ORDER — LORAZEPAM 0.5 MG PO TABS: 0.5000 mg | ORAL_TABLET | Freq: Three times a day (TID) | ORAL | 1 refills | Status: DC | PRN

## 2020-11-02 MED ORDER — GABAPENTIN 300 MG PO CAPS: 300.0000 mg | ORAL_CAPSULE | Freq: Every day | ORAL | 0 refills | Status: DC

## 2020-11-02 NOTE — Progress Notes (Signed)
Margaret Golden [id number] [birth year] 30 y.o.  Subjective:   Patient ID:  Margaret Golden is a 30 y.o. (DOB Jun 20, 1990) female.  Chief Complaint:  Chief Complaint  Patient presents with   Depression   Anxiety    HPI Margaret Golden presents to the office today for follow-up of depression, anxiety, and irritability. She reports that she has been experiencing more irritability and has been wanting to isolate more. She reports that she is more easily annoyed and frustrated. She reports that irritability was starting prior to Gabapentin. Has been isolating due to concerns that she may "lash out" and since it is difficult to be around people. Unable to identify trigger to worsening irritability. Has periods where anxiety will suddenly increase. Frequent worry about money, health, etc. Difficulty in social situations. Will feel pain in her chest with more severe anxiety. Reports exaggerated startle response. Has been wanting to sleep excessively and has been less interested in eating. Has been missing medications. Has felt more depressed. Energy and motivation have been very low. Has lost interest in some things and has started some new interests. Sometimes hyper-focusing and notices more distractibility when she is not at home. Denies SI.   Has not seen a significant improvement with Gabapentin other than some decrease in RLS.    Past medication trials: Sertraline-helpful for depression and anxiety Wellbutrin- initially helpful and then was less helpful Lexapro-ineffective Cymbalta Pristiq Seroquel- does not recall any significant improvement Latuda Abilify Propranolol- prescribed for HR. Not helpful for anxiety Melatonin Vyvanse Adderall XR- not effective Concerta- Notices duration only lasts about 4 hours. No significant improvement with doses less than 54 mg.  Margaret Golden- May have caused n/v and poor concentration Horizant Lamictal-effective for mood Trazodone-effective for  insomnia but causes some excessive drowsiness Rexulti- May have caused n/v and poor concentration Ativan- helpful Armodafinil- jaw locked  PHQ2-9    Flowsheet Row Office Visit from 09/03/2019 in [facility] Nutrition from 02/10/2019 [facility] Office Visit from 05/02/2018 in [facility] at [facility] [employer] Office Visit from 04/19/2017 in [facility] at [facility] Nutrition from 02/20/2016 in [facility]  PHQ-2 Total Score 3 2 4 3 4   PHQ-9 Total Score 14 -- 15 16 --      Flowsheet Row Admission (Discharged) from 06/27/2020 in [facility] 4E CV SURGICAL PROGRESSIVE CARE Pre-Admission Testing 60 from 06/23/2020 in [facility] PREADMISSION TESTING [facility] CORE BIOPSY LYM NOD [facility] from 06/09/2020 in [facility] ULTRASOUND  C-SSRS RISK CATEGORY No Risk No Risk No Risk        Review of Systems:  Review of Systems  Musculoskeletal:  Negative for gait problem.  Neurological:  Positive for headaches.       Increased migraines  Psychiatric/Behavioral:         Please refer to HPI   Medications: I have reviewed the patient's current medications.  Current Outpatient Medications  Medication Sig Dispense Refill   aspirin-acetaminophen-caffeine (EXCEDRIN MIGRAINE) 250-250-65 MG tablet Take 2 tablets by mouth daily as needed for headache or migraine.     fluticasone furoate-vilanterol (BREO ELLIPTA) 100-25 MCG/INH AEPB Inhale 1 puff into the lungs daily. 28 each 0   furosemide (LASIX) 20 MG tablet TAKE 1 TABLET BY MOUTH TWO TIMES A WEEK 26 tablet 5   Iron-FA-B Cmp-C-Biot-Probiotic (FUSION PLUS) CAPS One capsule twice daily with orange juice. Stop your Ferrous sulfate. (Patient  taking differently: Take 1 capsule by mouth 2 (two) times daily. with orange juice. Stop your Ferrous sulfate.) 30 capsule 1   lamoTRIgine (LAMICTAL) 200 MG tablet Take 1 tablet  (200 mg total) by mouth daily. 90 tablet 0   levothyroxine (SYNTHROID) 100 MCG tablet Take 1 tablet (100 mcg total) by mouth daily. 90 tablet 0   levothyroxine (SYNTHROID) 112 MCG tablet Take 112 mcg by mouth daily before breakfast. Take with the 100 mcg dose to equal 212 mcg daily     methotrexate (RHEUMATREX) 2.5 MG tablet Take 4 tablets (10 mg total) by mouth once a week. Caution:Chemotherapy. Protect from light. 16 tablet 2   Multiple Vitamins-Minerals (MULTIVITAMIN WITH MINERALS) tablet Take 1 tablet by mouth daily.     omeprazole (PRILOSEC) 20 MG capsule TAKE TWO CAPSULES BY MOUTH DAILY 60 capsule 0   potassium chloride SA (KLOR-CON) 20 MEQ tablet Take 1 tablet (20 mEq total) by mouth 2 (two) times a week. 26 tablet 0   propranolol (INDERAL) 20 MG tablet Take 1 tablet (20 mg total) by mouth 3 (three) times daily. 270 tablet 3   sertraline (ZOLOFT) 100 MG tablet Take 2 tablets (200 mg total) by mouth daily. 180 tablet 1   topiramate (TOPAMAX) 50 MG tablet Start with 50mg (1 tab) at bedtime. In 1 week increase to 100mg (2 tabs) at bedtime. (Patient taking differently: Take 100 mg by mouth at bedtime.) 180 tablet 6   Vitamin D, Ergocalciferol, (DRISDOL) 1.25 MG (50000 UNIT) CAPS capsule Take 1 capsule (50,000 Units total) by mouth 2 (two) times a week. 10 capsule 0   gabapentin (NEURONTIN) 300 MG capsule Take 1 capsule (300 mg total) by mouth at bedtime. 90 capsule 0   LORazepam (ATIVAN) 0.5 MG tablet Take 1 tablet (0.5 mg total) by mouth every 8 (eight) hours as needed for anxiety. 30 tablet 1   No current facility-administered medications for this visit.    Medication Side Effects: Other: Reports increased HA's with Methotrexate.   Allergies:  Allergies  Allergen Reactions   Augmentin [Amoxicillin-Pot Clavulanate] Hives    Past Medical History:  Diagnosis Date   Acid reflux    ADD (attention deficit disorder)    Anemia    Anxiety    Arthritis    left ankle   Back pain    Chest  pain    Complication of anesthesia    Depression    Dyspnea    with exertion   Exercise-induced asthma    as a child   Fatty liver    History of blood transfusion    History of chicken pox    History of kidney stones    passed    Hypothyroidism    Joint pain    Lower extremity edema    Major depressive disorder    Migraines    chronic   Palpitations    Pneumonia    x 2   PONV (postoperative nausea and vomiting)    nausea   RLS (restless legs syndrome)    Sleep apnea 2021   Thyroid disease    hypothyroid    Past Medical History, Surgical history, Social history, and Family history were reviewed and updated as appropriate.   Please see review of systems for further details on the patient's review from today.   Objective:   Physical Exam:  LMP 10/10/2020   Physical Exam Constitutional:      General: She is not in acute distress. Musculoskeletal:  General: No deformity.  Neurological:     Mental Status: She is alert and oriented to person, place, and time.     Coordination: Coordination normal.  Psychiatric:        Attention and Perception: Attention and perception normal. She does not perceive auditory or visual hallucinations.        Mood and Affect: Mood is anxious. Affect is not labile, blunt, angry or inappropriate.        Speech: Speech normal.        Behavior: Behavior normal.        Thought Content: Thought content normal. Thought content is not paranoid or delusional. Thought content does not include homicidal or suicidal ideation. Thought content does not include homicidal or suicidal plan.        Cognition and Memory: Cognition and memory normal.        Judgment: Judgment normal.     Comments: Insight intact Dysphoric mood    Lab Review:     Component Value Date/Time   NA 138 11/02/2020 1437   K 4.0 11/02/2020 1437   CL 104 11/02/2020 1437   CO2 23 11/02/2020 1437   GLUCOSE 90 11/02/2020 1437   BUN 8 11/02/2020 1437   CREATININE 0.78  11/02/2020 1437   CREATININE 0.74 09/30/2019 1047   CREATININE 0.75 07/06/2016 1534   CALCIUM 8.7 11/02/2020 1437   PROT 7.3 11/02/2020 1437   ALBUMIN 4.3 11/02/2020 1437   AST 29 11/02/2020 1437   AST 27 09/30/2019 1047   ALT 20 11/02/2020 1437   ALT 14 09/30/2019 1047   ALKPHOS 85 11/02/2020 1437   BILITOT 0.7 11/02/2020 1437   BILITOT 0.4 09/30/2019 1047   GFRNONAA >60 06/28/2020 0703   GFRNONAA >60 09/30/2019 1047   GFRAA >60 09/30/2019 1047       Component Value Date/Time   WBC 7.1 11/02/2020 1437   RBC 4.65 11/02/2020 1437   HGB 14.2 11/02/2020 1437   HGB 14.0 08/23/2020 1106   HGB 9.3 (L) 08/26/2019 1045   HCT 42.3 11/02/2020 1437   HCT 33.4 (L) 08/26/2019 1045   PLT 163.0 11/02/2020 1437   PLT 177 08/23/2020 1106   PLT 234 08/26/2019 1045   MCV 91.0 11/02/2020 1437   MCV 72 (L) 08/26/2019 1045   MCH 29.3 08/23/2020 1106   MCHC 33.5 11/02/2020 1437   RDW 15.5 11/02/2020 1437   RDW 15.4 08/26/2019 1045   LYMPHSABS 1.7 11/02/2020 1437   MONOABS 0.4 11/02/2020 1437   EOSABS 0.2 11/02/2020 1437   BASOSABS 0.0 11/02/2020 1437    No results found for: POCLITH, LITHIUM   No results found for: PHENYTOIN, PHENOBARB, VALPROATE, CBMZ   .res Assessment: Plan:   Pt seen for 30 minutes and time spent discussing recent irritability to include possible causes, triggers, and treatment options. Discussed decreasing Gabapentin to 300 mg po QHS for insomnia since increased irritability occurred around the time that Gabapentin was increased.  Discussed that irritability may be exacerbated by missed doses of medications, particularly with Sertraline, since discontinuation s/s can include some irritability.Discussed strategies to help increase consistency with medications. Will change Sertraline administration time of Sertraline to the evening since she typically is more consistent with evening medications. Continue Sertraline 200 mg po qd for depression and anxiety.  Continue  Lamictal 200 mg po qd for mood s/s. Continue Ativan 0.5 mg po q 8 hours prn anxiety. Recommend continuing therapy.  Pt to follow-up in one month or sooner if clinically indicated.  Patient advised to contact office with any questions, adverse effects, or acute worsening in signs and symptoms.  Margaret Golden was seen today for depression and anxiety.  Diagnoses and all orders for this visit:  Social phobia -     LORazepam (ATIVAN) 0.5 MG tablet; Take 1 tablet (0.5 mg total) by mouth every 8 (eight) hours as needed for anxiety. -     gabapentin (NEURONTIN) 300 MG capsule; Take 1 capsule (300 mg total) by mouth at bedtime.  Major depression, recurrent, chronic (HCC) -     gabapentin (NEURONTIN) 300 MG capsule; Take 1 capsule (300 mg total) by mouth at bedtime.  RLS (restless legs syndrome) -     gabapentin (NEURONTIN) 300 MG capsule; Take 1 capsule (300 mg total) by mouth at bedtime.    Please see After Visit Summary for patient specific instructions.  Future Appointments  Date Time Provider Department Center  11/16/2020 11:00 AM [name], NP [facility]  11/21/2020  3:00 PM [facility]  12/01/2020  2:30 PM Corie Chiquito, PMHNP [facility]CP None  12/08/2020  1:00 PM [place], Kardie, DO [facility] None  12/15/2020 11:30 AM Butch Penny, NP [facility] None  02/14/2021  1:45 PM [facility] None  02/14/2021  2:15 PM [place], Brand Males, NP [facility] None  02/24/2021 11:30 AM [facility] None  02/24/2021 11:45 AM [place], Brand Males, NP [facility] None    No orders of the defined types were placed in this encounter.   -------------------------------

## 2020-11-04 ENCOUNTER — Telehealth: Payer: Self-pay | Admitting: Family

## 2020-11-04 DIAGNOSIS — E039 Hypothyroidism, unspecified: Secondary | ICD-10-CM

## 2020-11-04 MED ORDER — LEVOTHYROXINE SODIUM 137 MCG PO TABS: 137.0000 ug | ORAL_TABLET | Freq: Every day | ORAL | 0 refills | Status: DC

## 2020-11-04 NOTE — Telephone Encounter (Signed)
Patient advised of results and dose change. She verbalized understanding, she will continue the 100 mcg and change from 112 to 137 mcg. She will follow up in a couple of weeks as scheduled.

## 2020-11-04 NOTE — Telephone Encounter (Signed)
Synthroid needs to be increased.  I would recommend that she stop the 112 mcg dose and increase this to 137 mcg.  Continue the 100 mcg for a total of 237 mcg daily.

## 2020-11-11 DIAGNOSIS — G4733 Obstructive sleep apnea (adult) (pediatric): Secondary | ICD-10-CM | POA: Diagnosis not present

## 2020-11-16 ENCOUNTER — Encounter: Payer: Self-pay | Admitting: Family

## 2020-11-16 ENCOUNTER — Ambulatory Visit (INDEPENDENT_AMBULATORY_CARE_PROVIDER_SITE_OTHER): Payer: BC Managed Care – PPO | Admitting: Family

## 2020-11-16 ENCOUNTER — Telehealth: Payer: Self-pay | Admitting: Family

## 2020-11-16 ENCOUNTER — Other Ambulatory Visit: Payer: Self-pay

## 2020-11-16 VITALS — BP 121/73 | HR 85 | Temp 98.6°F | Resp 16 | Ht 67.5 in | Wt 376.0 lb

## 2020-11-16 DIAGNOSIS — Z23 Encounter for immunization: Secondary | ICD-10-CM

## 2020-11-16 DIAGNOSIS — Z0000 Encounter for general adult medical examination without abnormal findings: Secondary | ICD-10-CM | POA: Insufficient documentation

## 2020-11-16 NOTE — Telephone Encounter (Signed)
-----  Message from Freddi Starr, MD sent at 11/16/2020  1:29 PM EDT ----- If she feels the methotrexate is causing these symptoms, then she can stop treatment and we will monitor her sarcoidosis with PFTs and imaging.   Otherwise she is not a good candidate for chronic steroid therapy.   She needs her sleep apnea treated as well which could be leading to her headaches that she has complained about in the past.   Margaret Golden   ----- Message ----- From: Debbrah Alar, NP Sent: 11/16/2020  11:19 AM EDT To: Freddi Starr, MD  Hi Dr. Erin Fulling,  I met with Margaret Golden today and she is concerned that she is experiencing multiple side effects form the methotrexate including diarrhea, nausea, vision issues, muscle pain, dizziness, drowsiness, headache, increased anxiety.   She said that she spoke with your nurse and felt that she did not listen to her concerns so I told her I would reach out to you directly.  Thanks,  [employer]

## 2020-11-16 NOTE — Assessment & Plan Note (Addendum)
Wt Readings from Last 3 Encounters:  11/16/20 (!) 376 lb (170.6 kg)  10/11/20 (!) 366 lb (166 kg)  09/27/20 (!) 367 lb (166.5 kg)   Encouraged pt to continue healthy diet, exercise, weight loss.  Pap up to date.  Pneumovax today.

## 2020-11-16 NOTE — Progress Notes (Signed)
Subjective:   By signing my name below, I, [name], attest that this documentation has been prepared under the direction and in the presence of [name] NP. 11/16/2020    Patient ID: Margaret Golden, female    DOB: 1990/07/19, 29 y.o.   MRN: [id number]  Chief Complaint  Patient presents with   Annual Exam    HPI Patient is in today for a comprehensive physical exam.   She denies having any unexpected weight change, ear pain, hearing loss and rhinorrhea, visual disturbance, cough, chest pain and leg swelling, nausea, vomiting, diarrhea and blood in stool, or dysuria and frequency, for myalgias and arthralgias, rash, adenopathy, depression or anxiety at this time. She occasionally drinks alcohol in social settings. She is not sexually active. She does not use tobacco products. She does [place] vape products.   Anxiety- Her anxiety and depression are worsening. She notes that it is multiple factors contributing to her worsening symptoms. She is planning to make an appointment with her psychologist.  Methotrexate- She reports having side effects while taking methotrexate. She complains of feeling worsening insomnia, little to no appetite, chest pain, racing and pounding heart rate, feet and ankle swelling, dizziness, occasional blurry and double vision. She has spoken to her pulmonologist's nurses about her symptoms but did not find a treatment for her.  Headache- She reports having frequent headaches for the past 4 weeks. She has history of migraines and notes that recently her headaches have developed in migraines  Immunizations: She is eligilbe for the pneumonia vaccines and is willing to get it at this time.  Diet: She is not managing a healthy diet at this time. She lost most of her appetite and feels nauseas after eating a lot of food. Exercise: She struggles to participate in regular exercise at this time.  Pap Smear: Last completed 05/19/2019. Results are normal. Repeat in  3 years.  Dental: She is UTD on dental care. Vision: She is UTD on vision care at this time.    Health Maintenance Due  Topic Date Due   Pneumococcal Vaccine 74-85 Years old (1 - PCV) Never done   COVID-19 Vaccine (3 - Pfizer risk series) 08/17/2019   INFLUENZA VACCINE  10/31/2020    Past Medical History:  Diagnosis Date   Acid reflux    ADD (attention deficit disorder)    Anemia    Anxiety    Arthritis    left ankle   Back pain    Chest pain    Complication of anesthesia    Depression    Dyspnea    with exertion   Exercise-induced asthma    as a child   Fatty liver    History of blood transfusion    History of chicken pox    History of kidney stones    passed    Hypothyroidism    Joint pain    Lower extremity edema    Major depressive disorder    Migraines    chronic   Palpitations    Pneumonia    x 2   PONV (postoperative nausea and vomiting)    nausea   RLS (restless legs syndrome)    Sleep apnea 2021   Thyroid disease    hypothyroid    Past Surgical History:  Procedure Laterality Date   BRONCHIAL NEEDLE ASPIRATION BIOPSY  03/31/2020   Procedure: BRONCHIAL NEEDLE ASPIRATION BIOPSIES;  Surgeon: Josephine Igo, DO;  Location: [facility];  Service: Pulmonary;;   BRONCHIAL  WASHINGS  03/31/2020   Procedure: BRONCHIAL WASHINGS;  Surgeon: Josephine Igo, DO;  Location: [facility];  Service: Pulmonary;;   DILATATION & CURETTAGE/HYSTEROSCOPY WITH MYOSURE N/A 03/22/2020   Procedure: DILATATION & CURETTAGE/HYSTEROSCOPY WITH MYOSURE;  Surgeon: Romualdo Bolk, MD;  Location: [facility] OR;  Service: Gynecology;  Laterality: N/A;   INTERCOSTAL NERVE BLOCK Right 06/27/2020   Procedure: INTERCOSTAL NERVE BLOCK;  Surgeon: Corliss Skains, MD;  Location: [facility] OR;  Service: Thoracic;  Laterality: Right;   INTRAUTERINE DEVICE (IUD) INSERTION N/A 03/22/2020   Procedure: INTRAUTERINE DEVICE (IUD) INSERTION;  Surgeon: Romualdo Bolk, MD;  Location: [facility] OR;   Service: Gynecology;  Laterality: N/A;  Mirena IUD insertion   KNEE ARTHROSCOPY Right 2007   LYMPH NODE BIOPSY Right 06/27/2020   Procedure: LYMPH NODE BIOPSY;  Surgeon: Corliss Skains, MD;  Location: [facility] OR;  Service: Thoracic;  Laterality: Right;   TONSILLECTOMY  2010   TONSILLECTOMY     VIDEO BRONCHOSCOPY WITH ENDOBRONCHIAL ULTRASOUND N/A 03/31/2020   Procedure: VIDEO BRONCHOSCOPY WITH ENDOBRONCHIAL ULTRASOUND;  Surgeon: Josephine Igo, DO;  Location: [facility];  Service: Pulmonary;  Laterality: N/A;   WISDOM TOOTH EXTRACTION     WISDOM TOOTH EXTRACTION Bilateral    upper and lower    Family History  Problem Relation Age of Onset   Depression Mother    Obesity Mother        died from "an infection" ? MRSA   Depression Father    Sleep apnea Father    Obesity Father    Arthritis Maternal Grandmother    Breast cancer Maternal Grandmother        in remission   Diabetes Maternal Grandfather    Arthritis Maternal Grandfather    Arthritis Paternal Grandmother    Arthritis Paternal Grandfather    Cancer Paternal Aunt        colon   Cancer Paternal Uncle        brain cancer   Migraines Neg Hx     Social History   Socioeconomic History   Marital status: Single    Spouse name: Not on file   Number of children: 0   Years of education: 12   Highest education level: Not on file  Occupational History   Occupation: Conservation officer, historic buildings  Tobacco Use   Smoking status: Never   Smokeless tobacco: Never  Vaping Use   Vaping Use: Never used  Substance and Sexual Activity   Alcohol use: Not Currently    Alcohol/week: 0.0 standard drinks   Drug use: No   Sexual activity: Never    Comment: never sexually active  Other Topics Concern   Not on file  Social History Narrative   Unemployed   Lives alone --update 11/11/19   Seeking work   Only child   Dog 3   Lives in a one story house with father, has a cat-12/26/16-sjb      Caffeine: 2-4 cups/day   Social  Determinants of Corporate investment banker Strain: Not on file  Food Insecurity: Not on file  Transportation Needs: Not on file  Physical Activity: Not on file  Stress: Not on file  Social Connections: Not on file  Intimate Partner Violence: Not on file    Outpatient Medications Prior to Visit  Medication Sig Dispense Refill   aspirin-acetaminophen-caffeine (EXCEDRIN MIGRAINE) 250-250-65 MG tablet Take 2 tablets by mouth daily as needed for headache or migraine.     fluticasone furoate-vilanterol (BREO ELLIPTA) 100-25 MCG/INH AEPB  Inhale 1 puff into the lungs daily. 28 each 0   furosemide (LASIX) 20 MG tablet TAKE 1 TABLET BY MOUTH TWO TIMES A WEEK 26 tablet 5   gabapentin (NEURONTIN) 300 MG capsule Take 1 capsule (300 mg total) by mouth at bedtime. 90 capsule 0   Iron-FA-B Cmp-C-Biot-Probiotic (FUSION PLUS) CAPS One capsule twice daily with orange juice. Stop your Ferrous sulfate. (Patient taking differently: Take 1 capsule by mouth 2 (two) times daily. with orange juice. Stop your Ferrous sulfate.) 30 capsule 1   lamoTRIgine (LAMICTAL) 200 MG tablet Take 1 tablet (200 mg total) by mouth daily. 90 tablet 0   levothyroxine (SYNTHROID) 100 MCG tablet Take 1 tablet (100 mcg total) by mouth daily. 90 tablet 0   levothyroxine (SYNTHROID) 137 MCG tablet Take 1 tablet (137 mcg total) by mouth daily before breakfast. 90 tablet 0   LORazepam (ATIVAN) 0.5 MG tablet Take 1 tablet (0.5 mg total) by mouth every 8 (eight) hours as needed for anxiety. 30 tablet 1   methotrexate (RHEUMATREX) 2.5 MG tablet Take 4 tablets (10 mg total) by mouth once a week. Caution:Chemotherapy. Protect from light. 16 tablet 2   Multiple Vitamins-Minerals (MULTIVITAMIN WITH MINERALS) tablet Take 1 tablet by mouth daily.     omeprazole (PRILOSEC) 20 MG capsule TAKE TWO CAPSULES BY MOUTH DAILY 60 capsule 0   potassium chloride SA (KLOR-CON) 20 MEQ tablet Take 1 tablet (20 mEq total) by mouth 2 (two) times a week. 26 tablet 0    propranolol (INDERAL) 20 MG tablet Take 1 tablet (20 mg total) by mouth 3 (three) times daily. 270 tablet 3   sertraline (ZOLOFT) 100 MG tablet Take 2 tablets (200 mg total) by mouth daily. 180 tablet 1   topiramate (TOPAMAX) 50 MG tablet Start with 50mg (1 tab) at bedtime. In 1 week increase to 100mg (2 tabs) at bedtime. (Patient taking differently: Take 100 mg by mouth at bedtime.) 180 tablet 6   Vitamin D, Ergocalciferol, (DRISDOL) 1.25 MG (50000 UNIT) CAPS capsule Take 1 capsule (50,000 Units total) by mouth 2 (two) times a week. 10 capsule 0   No facility-administered medications prior to visit.    Allergies  Allergen Reactions   Augmentin [Amoxicillin-Pot Clavulanate] Hives    Review of Systems  Constitutional:        (-)unexpected weight change (-)Adenopathy (+)loss of appetite  HENT:  Negative for hearing loss.        (-)Rhinorrhea   Eyes:  Positive for blurred vision and double vision.       (-)Visual disturbance  Respiratory:  Negative for cough.   Cardiovascular:  Positive for chest pain and leg swelling.       (+)tachycardia  Gastrointestinal:  Negative for blood in stool, constipation, diarrhea, nausea and vomiting.  Genitourinary:  Negative for dysuria and frequency.  Musculoskeletal:  Negative for joint pain and myalgias.  Skin:  Negative for rash.  Neurological:  Positive for dizziness and headaches.  Psychiatric/Behavioral:  Negative for depression. The patient has insomnia. The patient is not nervous/anxious.       Objective:    Physical Exam Constitutional:      General: She is not in acute distress.    Appearance: Normal appearance. She is not ill-appearing.  HENT:     Head: Normocephalic and atraumatic.     Right Ear: Tympanic membrane, ear canal and external ear normal.     Left Ear: Tympanic membrane, ear canal and external ear normal.  Eyes:  Extraocular Movements: Extraocular movements intact.     Pupils: Pupils are equal, round, and reactive  to light.     Comments: No nystagmus  Cardiovascular:     Rate and Rhythm: Regular rhythm.     Pulses: Normal pulses.     Heart sounds: Normal heart sounds. No murmur heard.   No gallop.  Pulmonary:     Effort: Pulmonary effort is normal. No respiratory distress.     Breath sounds: Normal breath sounds. No wheezing or rales.  Abdominal:     General: Bowel sounds are normal. There is no distension.     Palpations: Abdomen is soft. There is no mass.     Tenderness: There is no abdominal tenderness. There is no guarding or rebound.  Musculoskeletal:     Comments: 5/5 strength in both upper and lower extremities  Skin:    General: Skin is warm and dry.  Neurological:     Mental Status: She is alert and oriented to person, place, and time.     Deep Tendon Reflexes:     Reflex Scores:      Patellar reflexes are 2+ on the right side and 2+ on the left side. Psychiatric:        Behavior: Behavior normal.    BP 121/73 (BP Location: Right Arm, Patient Position: Sitting, Cuff Size: Large)   Pulse 85   Temp 98.6 F (37 C) (Oral)   Resp 16   Ht 5' 7.5" (1.715 m)   Wt (!) 376 lb (170.6 kg)   SpO2 99%   BMI 58.02 kg/m  Wt Readings from Last 3 Encounters:  11/16/20 (!) 376 lb (170.6 kg)  10/11/20 (!) 366 lb (166 kg)  09/27/20 (!) 367 lb (166.5 kg)       Assessment & Plan:   Problem List Items Addressed This Visit       Unprioritized   Preventative health care - Primary    Wt Readings from Last 3 Encounters:  11/16/20 (!) 376 lb (170.6 kg)  10/11/20 (!) 366 lb (166 kg)  09/27/20 (!) 367 lb (166.5 kg)  Encouraged pt to continue healthy diet, exercise, weight loss.  Pap up to date.  Pneumovax today.        Other Visit Diagnoses     Need for pneumococcal vaccination       Relevant Orders   Pneumococcal conjugate vaccine 20-valent (Prevnar 20) (Completed)        No orders of the defined types were placed in this encounter.   I, Sandford CrazeMelissa O'Sullivan NP, personally  preformed the services described in this documentation.  All medical record entries made by the scribe were at my direction and in my presence.  I have reviewed the chart and discharge instructions (if applicable) and agree that the record reflects my personal performance and is accurate and complete. 11/16/2020   I,[name],acting as a Neurosurgeonscribe for Lemont FillersMelissa S O'Sullivan, NP.,have documented all relevant documentation on the behalf of Lemont FillersMelissa S O'Sullivan, NP,as directed by  Lemont FillersMelissa S O'Sullivan, NP while in the presence of Lemont FillersMelissa S O'Sullivan, NP.   Lemont FillersMelissa S O'Sullivan, NP

## 2020-11-21 ENCOUNTER — Ambulatory Visit (INDEPENDENT_AMBULATORY_CARE_PROVIDER_SITE_OTHER): Payer: BC Managed Care – PPO | Admitting: Obstetrics and Gynecology

## 2020-11-21 ENCOUNTER — Other Ambulatory Visit: Payer: Self-pay

## 2020-11-21 ENCOUNTER — Encounter: Payer: Self-pay | Admitting: Obstetrics and Gynecology

## 2020-11-21 ENCOUNTER — Other Ambulatory Visit: Payer: BC Managed Care – PPO

## 2020-11-21 VITALS — BP 110/64 | HR 122 | Ht 67.0 in | Wt 374.0 lb

## 2020-11-21 DIAGNOSIS — L680 Hirsutism: Secondary | ICD-10-CM

## 2020-11-21 DIAGNOSIS — B372 Candidiasis of skin and nail: Secondary | ICD-10-CM | POA: Diagnosis not present

## 2020-11-21 DIAGNOSIS — Z6841 Body Mass Index (BMI) 40.0 and over, adult: Secondary | ICD-10-CM

## 2020-11-21 DIAGNOSIS — Z01419 Encounter for gynecological examination (general) (routine) without abnormal findings: Secondary | ICD-10-CM | POA: Diagnosis not present

## 2020-11-21 DIAGNOSIS — N6452 Nipple discharge: Secondary | ICD-10-CM | POA: Diagnosis not present

## 2020-11-21 MED ORDER — NYSTATIN 100000 UNIT/GM EX CREA: 1.0000 "application " | TOPICAL_CREAM | Freq: Two times a day (BID) | CUTANEOUS | 1 refills | Status: DC

## 2020-11-21 NOTE — Patient Instructions (Signed)
EXERCISE   We recommended that you start or continue a regular exercise program for good health. Physical activity is anything that gets your body moving, some is better than none. The CDC recommends 150 minutes per week of Moderate-Intensity Aerobic Activity and 2 or more days of Muscle Strengthening Activity.  Benefits of exercise are limitless: helps weight loss/weight maintenance, improves mood and energy, helps with depression and anxiety, improves sleep, tones and strengthens muscles, improves balance, improves bone density, protects from chronic conditions such as heart disease, high blood pressure and diabetes and so much more. To learn more visit: https://www.cdc.gov/physicalactivity/index.html  DIET: Good nutrition starts with a healthy diet of fruits, vegetables, whole grains, and lean protein sources. Drink plenty of water for hydration. Minimize empty calories, sodium, sweets. For more information about dietary recommendations visit: https://health.gov/our-work/nutrition-physical-activity/dietary-guidelines and https://www.myplate.gov/  ALCOHOL:  Women should limit their alcohol intake to no more than 7 drinks/beers/glasses of wine (combined, not each!) per week. Moderation of alcohol intake to this level decreases your risk of breast cancer and liver damage.  If you are concerned that you may have a problem, or your friends have told you they are concerned about your drinking, there are many resources to help. A well-known program that is free, effective, and available to all people all over the nation is Alcoholics Anonymous.  Check out this site to learn more: https://www.aa.org/   CALCIUM AND VITAMIN D:  Adequate intake of calcium and Vitamin D are recommended for bone health.  You should be getting between 1000-1200 mg of calcium and 800 units of Vitamin D daily between diet and supplements  PAP SMEARS:  Pap smears, to check for cervical cancer or precancers,  have traditionally been  done yearly, scientific advances have shown that most women can have pap smears less often.  However, every woman still should have a physical exam from her gynecologist every year. It will include a breast check, inspection of the vulva and vagina to check for abnormal growths or skin changes, a visual exam of the cervix, and then an exam to evaluate the size and shape of the uterus and ovaries. We will also provide age appropriate advice regarding health maintenance, like when you should have certain vaccines, screening for sexually transmitted diseases, bone density testing, colonoscopy, mammograms, etc.   MAMMOGRAMS:  All women over 40 years old should have a routine mammogram.   COLON CANCER SCREENING: Now recommend starting at age 45. At this time colonoscopy is not covered for routine screening until 50. There are take home tests that can be done between 45-49.   COLONOSCOPY:  Colonoscopy to screen for colon cancer is recommended for all women at age 50.  We know, you hate the idea of the prep.  We agree, BUT, having colon cancer and not knowing it is worse!!  Colon cancer so often starts as a polyp that can be seen and removed at colonscopy, which can quite literally save your life!  And if your first colonoscopy is normal and you have no family history of colon cancer, most women don't have to have it again for 10 years.  Once every ten years, you can do something that may end up saving your life, right?  We will be happy to help you get it scheduled when you are ready.  Be sure to check your insurance coverage so you understand how much it will cost.  It may be covered as a preventative service at no cost, but you should check   your particular policy.      Breast Self-Awareness Breast self-awareness means being familiar with how your breasts look and feel. It involves checking your breasts regularly and reporting any changes to your health care provider. Practicing breast self-awareness is  important. A change in your breasts can be a sign of a serious medical problem. Being familiar with how your breasts look and feel allows you to find any problems early, when treatment is more likely to be successful. All women should practice breast self-awareness, including women who have had breast implants. How to do a breast self-exam One way to learn what is normal for your breasts and whether your breasts are changing is to do a breast self-exam. To do a breast self-exam: Look for Changes  Remove all the clothing above your waist. Stand in front of a mirror in a room with good lighting. Put your hands on your hips. Push your hands firmly downward. Compare your breasts in the mirror. Look for differences between them (asymmetry), such as: Differences in shape. Differences in size. Puckers, dips, and bumps in one breast and not the other. Look at each breast for changes in your skin, such as: Redness. Scaly areas. Look for changes in your nipples, such as: Discharge. Bleeding. Dimpling. Redness. A change in position. Feel for Changes Carefully feel your breasts for lumps and changes. It is best to do this while lying on your back on the floor and again while sitting or standing in the shower or tub with soapy water on your skin. Feel each breast in the following way: Place the arm on the side of the breast you are examining above your head. Feel your breast with the other hand. Start in the nipple area and make  inch (2 cm) overlapping circles to feel your breast. Use the pads of your three middle fingers to do this. Apply light pressure, then medium pressure, then firm pressure. The light pressure will allow you to feel the tissue closest to the skin. The medium pressure will allow you to feel the tissue that is a little deeper. The firm pressure will allow you to feel the tissue close to the ribs. Continue the overlapping circles, moving downward over the breast until you feel your  ribs below your breast. Move one finger-width toward the center of the body. Continue to use the  inch (2 cm) overlapping circles to feel your breast as you move slowly up toward your collarbone. Continue the up and down exam using all three pressures until you reach your armpit.  Write Down What You Find  Write down what is normal for each breast and any changes that you find. Keep a written record with breast changes or normal findings for each breast. By writing this information down, you do not need to depend only on memory for size, tenderness, or location. Write down where you are in your menstrual cycle, if you are still menstruating. If you are having trouble noticing differences in your breasts, do not get discouraged. With time you will become more familiar with the variations in your breasts and more comfortable with the exam. How often should I examine my breasts? Examine your breasts every month. If you are breastfeeding, the best time to examine your breasts is after a feeding or after using a breast pump. If you menstruate, the best time to examine your breasts is 5-7 days after your period is over. During your period, your breasts are lumpier, and it may be more   difficult to notice changes. When should I see my health care provider? See your health care provider if you notice: A change in shape or size of your breasts or nipples. A change in the skin of your breast or nipples, such as a reddened or scaly area. Unusual discharge from your nipples. A lump or thick area that was not there before. Pain in your breasts. Anything that concerns you. Skin Yeast Infection  A skin yeast infection is a condition in which there is an overgrowth of yeast (candida) that normally lives on the skin. This condition usually occurs in areas ofthe skin that are constantly warm and moist, such as the armpits or the groin. What are the causes? This condition is caused by a change in the normal  balance of the yeast andbacteria that live on the skin. What increases the risk? You are more likely to develop this condition if you: Are obese. Are pregnant. Take birth control pills. Have diabetes. Take antibiotic medicines. Take steroid medicines. Are malnourished. Have a weak body defense system (immune system). Are 4 years of age or older. Wear tight clothing. What are the signs or symptoms? The most common symptom of this condition is itchiness in the affected area. Other symptoms include: Red, swollen area of the skin. Bumps on the skin. How is this diagnosed? This condition is diagnosed with a medical history and physical exam. Your health care provider may check for yeast by taking light scrapings of the skin to be viewed under a microscope. How is this treated? This condition is treated with medicine. Medicines may be prescribed or be available over the counter. The medicines may be: Taken by mouth (orally). Applied as a cream or powder to your skin. Follow these instructions at home:  Take or apply over-the-counter and prescription medicines only as told by your health care provider. Maintain a healthy weight. If you need help losing weight, talk with your health care provider. Keep your skin clean and dry. If you have diabetes, keep your blood sugar under control. Keep all follow-up visits as told by your health care provider. This is important. Contact a health care provider if: Your symptoms go away and then return. Your symptoms do not get better with treatment. Your symptoms get worse. Your rash spreads. You have a fever or chills. You have new symptoms. You have new warmth or redness of your skin. Summary A skin yeast infection is a condition in which there is an overgrowth of yeast (candida) that normally lives on the skin. This condition is caused by a change in the normal balance of the yeast and bacteria that live on the skin. Take or apply  over-the-counter and prescription medicines only as told by your health care provider. Keep your skin clean and dry. Contact a health care provider if your symptoms do not get better with treatment. This information is not intended to replace advice given to you by your health care provider. Make sure you discuss any questions you have with your healthcare provider. Document Revised: 08/06/2017 Document Reviewed: 08/06/2017 Elsevier Patient Education  2022 [employer].

## 2020-11-21 NOTE — Progress Notes (Signed)
30 y.o. G0P0000 Single White or Caucasian Not Hispanic or Latino female here for annual exam.    Patient c/o a 8 month having bilateral nipple discharge only when she expresses. Small amount bilaterally. On the right breast the d/c is a yellow tinted clear. The left nipple d/c is clear or white, multiple areas express liquid. On the right breast it is just one spot.  She is having pain with penetration with a vibrator, mostly hurts at the opening. Never sexually active with a partner.  She hasn't used a lubricant, recommended lubrication.  She c/o  rashes under breasts, lower abdomen in skin folds, left arm skin fold.   She is on synthroid, recent adjustment in medication. Last TSH was 13.76, she had stopped taking her medication. Back on it.  Recent normal CMP and CBC.   She is also worried about hormonal imbalance.  Period Cycle (Days): 28 Period Duration (Days): 4-5 Period Pattern: Regular Menstrual Flow: Light Menstrual Control: Maxi pad Menstrual Control Change Freq (Hours): 6-8 Dysmenorrhea: (!) Mild Dysmenorrhea Symptoms: Cramping, Headache, Diarrhea The patient has a h/o menorrhagia leading to anemia. In 12/21 she underwent a hysteroscopy, D&C with mirena IUD insertion. Pathology with benign endometrial polyp. Periods are much better with the mirena.  Some increase in hair growth on her chin, some increase in acne on her breasts.   Earlier this year she underwent surgery and biopsy of a lymph node at her mediastinum and was diagnosed with Sarcoid. She is short of breath all the time.   She was seen at the [facility] clinic for a while. Stopped.  She has social anxiety.   Patient's last menstrual period was 11/21/2020.          Sexually active: No.  The current method of family planning is IUD.    Exercising: No.  The patient does not participate in regular exercise at present. Smoker:  no  Health Maintenance: Pap:  05/21/19 WNL HR HPV Neg  History of abnormal Pap:  no MMG:   none  BMD:   none  Colonoscopy: none  TDaP:  02/02/15  Gardasil: not sure    reports that she has never smoked. She has never used smokeless tobacco. She reports that she does not currently use alcohol. She reports that she does not use drugs. She is trying to get on disability.   Past Medical History:  Diagnosis Date   Acid reflux    ADD (attention deficit disorder)    Anemia    Anxiety    Arthritis    left ankle   Back pain    Chest pain    Complication of anesthesia    Depression    Dyspnea    with exertion   Exercise-induced asthma    as a child   Fatty liver    History of blood transfusion    History of chicken pox    History of kidney stones    passed    Hypothyroidism    Joint pain    Lower extremity edema    Major depressive disorder    Migraines    chronic   Palpitations    Pneumonia    x 2   PONV (postoperative nausea and vomiting)    nausea   RLS (restless legs syndrome)    Sleep apnea 2021   Thyroid disease    hypothyroid    Past Surgical History:  Procedure Laterality Date   BRONCHIAL NEEDLE ASPIRATION BIOPSY  03/31/2020   Procedure:  BRONCHIAL NEEDLE ASPIRATION BIOPSIES;  Surgeon: Josephine Igo, DO;  Location: [facility];  Service: Pulmonary;;   BRONCHIAL WASHINGS  03/31/2020   Procedure: BRONCHIAL WASHINGS;  Surgeon: Josephine Igo, DO;  Location: [facility];  Service: Pulmonary;;   DILATATION & CURETTAGE/HYSTEROSCOPY WITH MYOSURE N/A 03/22/2020   Procedure: DILATATION & CURETTAGE/HYSTEROSCOPY WITH MYOSURE;  Surgeon: Romualdo Bolk, MD;  Location[facility] OR;  Service: Gynecology;  Laterality: N/A;   INTERCOSTAL NERVE BLOCK Right 06/27/2020   Procedure: INTERCOSTAL NERVE BLOCK;  Surgeon: Corliss Skains, MD;  Location: [facility] OR;  Service: Thoracic;  Laterality: Right;   INTRAUTERINE DEVICE (IUD) INSERTION N/A 03/22/2020   Procedure: INTRAUTERINE DEVICE (IUD) INSERTION;  Surgeon: Romualdo Bolk, MD;  Location[facility] OR;  Service:  Gynecology;  Laterality: N/A;  Mirena IUD insertion   KNEE ARTHROSCOPY Right 2007   LYMPH NODE BIOPSY Right 06/27/2020   Procedure: LYMPH NODE BIOPSY;  Surgeon: Corliss Skains, MD;  Location: [facility] OR;  Service: Thoracic;  Laterality: Right;   TONSILLECTOMY  2010   TONSILLECTOMY     VIDEO BRONCHOSCOPY WITH ENDOBRONCHIAL ULTRASOUND N/A 03/31/2020   Procedure: VIDEO BRONCHOSCOPY WITH ENDOBRONCHIAL ULTRASOUND;  Surgeon: Josephine Igo, DO;  Location: [facility];  Service: Pulmonary;  Laterality: N/A;   WISDOM TOOTH EXTRACTION     WISDOM TOOTH EXTRACTION Bilateral    upper and lower    Current Outpatient Medications  Medication Sig Dispense Refill   aspirin-acetaminophen-caffeine (EXCEDRIN MIGRAINE) 250-250-65 MG tablet Take 2 tablets by mouth daily as needed for headache or migraine.     fluticasone furoate-vilanterol (BREO ELLIPTA) 100-25 MCG/INH AEPB Inhale 1 puff into the lungs daily. 28 each 0   furosemide (LASIX) 20 MG tablet TAKE 1 TABLET BY MOUTH TWO TIMES A WEEK 26 tablet 5   gabapentin (NEURONTIN) 300 MG capsule Take 1 capsule (300 mg total) by mouth at bedtime. 90 capsule 0   Iron-FA-B Cmp-C-Biot-Probiotic (FUSION PLUS) CAPS One capsule twice daily with orange juice. Stop your Ferrous sulfate. (Patient taking differently: Take 1 capsule by mouth 2 (two) times daily. with orange juice. Stop your Ferrous sulfate.) 30 capsule 1   lamoTRIgine (LAMICTAL) 200 MG tablet Take 1 tablet (200 mg total) by mouth daily. 90 tablet 0   levothyroxine (SYNTHROID) 100 MCG tablet Take 1 tablet (100 mcg total) by mouth daily. 90 tablet 0   levothyroxine (SYNTHROID) 137 MCG tablet Take 1 tablet (137 mcg total) by mouth daily before breakfast. 90 tablet 0   LORazepam (ATIVAN) 0.5 MG tablet Take 1 tablet (0.5 mg total) by mouth every 8 (eight) hours as needed for anxiety. 30 tablet 1   methotrexate (RHEUMATREX) 2.5 MG tablet Take 4 tablets (10 mg total) by mouth once a week. Caution:Chemotherapy.  Protect from light. 16 tablet 2   Multiple Vitamins-Minerals (MULTIVITAMIN WITH MINERALS) tablet Take 1 tablet by mouth daily.     omeprazole (PRILOSEC) 20 MG capsule TAKE TWO CAPSULES BY MOUTH DAILY 60 capsule 0   potassium chloride SA (KLOR-CON) 20 MEQ tablet Take 1 tablet (20 mEq total) by mouth 2 (two) times a week. 26 tablet 0   propranolol (INDERAL) 20 MG tablet Take 1 tablet (20 mg total) by mouth 3 (three) times daily. 270 tablet 3   sertraline (ZOLOFT) 100 MG tablet Take 2 tablets (200 mg total) by mouth daily. 180 tablet 1   topiramate (TOPAMAX) 50 MG tablet Start with 50mg (1 tab) at bedtime. In 1 week increase to 100mg (2 tabs) at bedtime. (Patient taking  differently: Take 100 mg by mouth at bedtime.) 180 tablet 6   Vitamin D, Ergocalciferol, (DRISDOL) 1.25 MG (50000 UNIT) CAPS capsule Take 1 capsule (50,000 Units total) by mouth 2 (two) times a week. 10 capsule 0   No current facility-administered medications for this visit.    Family History  Problem Relation Age of Onset   Depression Mother    Obesity Mother        died from "an infection" ? MRSA   Depression Father    Sleep apnea Father    Obesity Father    Arthritis Maternal Grandmother    Breast cancer Maternal Grandmother        in remission   Diabetes Maternal Grandfather    Arthritis Maternal Grandfather    Arthritis Paternal Grandmother    Arthritis Paternal Grandfather    Cancer Paternal Aunt        colon   Cancer Paternal Uncle        brain cancer   Migraines Neg Hx     Review of Systems  Genitourinary:  Positive for vaginal discharge and vaginal pain.  All other systems reviewed and are negative.  Exam:   BP 110/64   Pulse (!) 122   Ht 5\' 7"  (1.702 m)   Wt (!) 374 lb (169.6 kg)   LMP 11/21/2020   SpO2 99%   BMI 58.58 kg/m   Weight change: @WEIGHTCHANGE @ Height:   Height: 5\' 7"  (170.2 cm)  Ht Readings from Last 3 Encounters:  11/21/20 5\' 7"  (1.702 m)  11/16/20 5' 7.5" (1.715 m)  10/11/20 5' 7.5"  (1.715 m)    General appearance: alert, cooperative and appears stated age Head: Normocephalic, without obvious abnormality, atraumatic Neck: no adenopathy, supple, symmetrical, trachea midline and thyroid normal to inspection and palpation Lungs: clear to auscultation bilaterally Cardiovascular: regular rate and rhythm Breasts: normal appearance, no masses or tenderness, unable to express d/c from either nipple today Abdomen: soft, non-tender; non distended,  no masses,  no organomegaly Extremities: extremities normal, atraumatic, no cyanosis or edema Skin: Skin color, texture, turgor normal. Red, patchy rash under her right breast, bilateral axilla and panus Lymph nodes: Cervical, supraclavicular, and axillary nodes normal. No abnormal inguinal nodes palpated Neurologic: Grossly normal   Pelvic: External genitalia:  no lesions              Urethra:  normal appearing urethra with no masses, tenderness or lesions              Bartholins and Skenes: normal                 Vagina: normal appearing vagina with normal color and discharge, no lesions              Cervix: no lesions and IUD string 3-4 cm               Bimanual Exam:  Uterus:   no masses or tenderness              Adnexa: no mass, fullness, tenderness               Rectovaginal: Confirms               Anus:  normal sphincter tone, no lesions  11/23/20 chaperoned for the exam.  1. Well woman exam Labs with primary No pap this year Discussed breast self awareness  2. Nipple discharge Bilateral, clear to white, only notices when she expresses - Prolactin  3. Hirsutism - 17-Hydroxyprogesterone - Testosterone, Total, LC/MS/MS  4. BMI 50.0-59.9, adult [facility](HCC) She is short of breath, not able to exercise. Trouble sticking to a diet F/U with primary  5. Candidal intertrigo - nystatin cream (MYCOSTATIN); Apply 1 application topically 2 (two) times daily. Apply to affected area BID for up to 7 days.  Dispense: 30 g;  Refill: 1

## 2020-11-23 DIAGNOSIS — D869 Sarcoidosis, unspecified: Secondary | ICD-10-CM

## 2020-11-23 NOTE — Telephone Encounter (Signed)
Dr. Francine Graven, please see patient comment and advise.  Thank you.

## 2020-11-24 DIAGNOSIS — F4323 Adjustment disorder with mixed anxiety and depressed mood: Secondary | ICD-10-CM | POA: Diagnosis not present

## 2020-11-24 MED ORDER — METHOTREXATE 2.5 MG PO TABS: 15.0000 mg | ORAL_TABLET | ORAL | 6 refills | Status: DC

## 2020-11-24 MED ORDER — METHOTREXATE 2.5 MG PO TABS: 10.0000 mg | ORAL_TABLET | ORAL | 2 refills | Status: DC

## 2020-11-24 NOTE — Addendum Note (Signed)
Addended by: Melody Comas on: 11/24/2020 03:50 PM   Modules accepted: Orders

## 2020-11-28 DIAGNOSIS — T8859XA Other complications of anesthesia, initial encounter: Secondary | ICD-10-CM | POA: Insufficient documentation

## 2020-11-28 DIAGNOSIS — Z9889 Other specified postprocedural states: Secondary | ICD-10-CM | POA: Insufficient documentation

## 2020-11-28 DIAGNOSIS — R112 Nausea with vomiting, unspecified: Secondary | ICD-10-CM | POA: Insufficient documentation

## 2020-11-28 LAB — 17-HYDROXYPROGESTERONE: 17-OH-Progesterone, LC/MS/MS: 11 ng/dL

## 2020-11-28 LAB — PROLACTIN: Prolactin: 12.3 ng/mL

## 2020-11-28 LAB — TESTOSTERONE, TOTAL, LC/MS/MS: Testosterone, Total, LC-MS-MS: 22 ng/dL (ref 2–45)

## 2020-11-30 DIAGNOSIS — F4323 Adjustment disorder with mixed anxiety and depressed mood: Secondary | ICD-10-CM | POA: Diagnosis not present

## 2020-12-01 ENCOUNTER — Encounter: Payer: Self-pay | Admitting: Psychiatry

## 2020-12-01 ENCOUNTER — Other Ambulatory Visit: Payer: Self-pay

## 2020-12-01 ENCOUNTER — Ambulatory Visit (INDEPENDENT_AMBULATORY_CARE_PROVIDER_SITE_OTHER): Payer: BC Managed Care – PPO | Admitting: Psychiatry

## 2020-12-01 DIAGNOSIS — F4010 Social phobia, unspecified: Secondary | ICD-10-CM | POA: Diagnosis not present

## 2020-12-01 DIAGNOSIS — F339 Major depressive disorder, recurrent, unspecified: Secondary | ICD-10-CM | POA: Diagnosis not present

## 2020-12-01 DIAGNOSIS — G2581 Restless legs syndrome: Secondary | ICD-10-CM | POA: Diagnosis not present

## 2020-12-01 DIAGNOSIS — F332 Major depressive disorder, recurrent severe without psychotic features: Secondary | ICD-10-CM

## 2020-12-01 MED ORDER — LAMOTRIGINE 200 MG PO TABS: 200.0000 mg | ORAL_TABLET | Freq: Every day | ORAL | 0 refills | Status: DC

## 2020-12-01 MED ORDER — LATUDA 20 MG PO TABS: 20.0000 mg | ORAL_TABLET | Freq: Every day | ORAL | 0 refills | Status: DC

## 2020-12-01 MED ORDER — LURASIDONE HCL 40 MG PO TABS: 40.0000 mg | ORAL_TABLET | Freq: Every day | ORAL | 0 refills | Status: DC

## 2020-12-01 MED ORDER — GABAPENTIN 300 MG PO CAPS: 300.0000 mg | ORAL_CAPSULE | Freq: Every day | ORAL | 0 refills | Status: DC

## 2020-12-01 NOTE — Progress Notes (Signed)
Margaret Golden [id number] [birth year] 30 y.o.  Subjective:   Patient ID:  Margaret Golden is a 30 y.o. (DOB [birth year]) female.  Chief Complaint:  Chief Complaint  Patient presents with   Depression   Anxiety   Insomnia    HPI Margaret Golden presents to the office today for follow-up of depression, anxiety, and insomnia.She reports worsening depression in the last week. She reports that health and financial stressors are contributing to her depression. She reports that she has been feeling consistently sad, discouraged, and hopeless. Reports feelings of guilty and shame. Frequent fatigue and low motivation. Has been behind with chores and personal hygiene. Has lost interested in usual activities. Has had increased irritability and frustration. Had crying episode last night. Has very low appetite overall "but I am still eating because that is what I do when I am depressed." Reports eating unhealthy foods.  She reports that she has also been experiencing high levels of anxiety. Reports chest tightness, shortness of breath, and restlessness with anxiety. She reports that she has frequent worry and anxious thoughts. Denies any recent panic attacks. She reports that she has had increased social anxiety that has kept her from being around close group of friends.   Has been having insomnia. Reports feeling fearful that something could happen in her sleep. "All I want to do is fall asleep, but I don't want to in case I don't wake up."   Reports passive death wishes and fear of dying. Denies SI.   Father was laid off and he was previously helping financially support her.   Reports that she has not been taking medications consistently. Has used Ativan prn on a few occasions.    Past medication trials: Sertraline-helpful for depression and anxiety Wellbutrin- initially helpful and then was less helpful Lexapro-ineffective Cymbalta Pristiq Seroquel- does not recall any significant  improvement Latuda Abilify Propranolol- prescribed for HR. Not helpful for anxiety Melatonin Vyvanse Adderall XR- not effective Concerta- Notices duration only lasts about 4 hours. No significant improvement with doses less than 54 mg.  [name]- May have caused n/v and poor concentration Horizant Lamictal-effective for mood Trazodone-effective for insomnia but causes some excessive drowsiness Rexulti- May have caused n/v and poor concentration Ativan- helpful Armodafinil- jaw locked  GAD-7    Flowsheet Row Office Visit from 11/16/2020 in [facility] at [facility]  Total GAD-7 Score 19      PHQ2-9    Flowsheet Row Office Visit from 11/16/2020 in [facility] Office Visit from 09/03/2019 in [facility] Nutrition from [facility]/10/[facility] Office Visit from 05/02/2018 in [facility] at [facility] Office Visit from 04/19/2017 in [facility]  PHQ-2 Total Score 6 3 2 4 3   PHQ-9 Total Score 20 14 -- 15 16      Flowsheet Row Admission (Discharged) from 06/27/2020 in [facility] 4E CV SURGICAL PROGRESSIVE CARE Pre-Admission Testing 60 from 06/23/2020 in [facility] PREADMISSION TESTING [facility] CORE BIOPSY LYM NOD [facility] from 06/09/2020 in [facility] ULTRASOUND  C-SSRS RISK CATEGORY No Risk No Risk No Risk        Review of Systems:  Review of Systems  Respiratory:  Positive for shortness of breath.   Musculoskeletal:  Negative for gait problem.  Psychiatric/Behavioral:         Please refer to HPI  Medications: I have reviewed the patient's current medications.  Current Outpatient Medications  Medication Sig Dispense Refill   aspirin-acetaminophen-caffeine (EXCEDRIN MIGRAINE) 250-250-65 MG tablet Take 2 tablets by mouth daily as needed for headache or migraine.      fluticasone furoate-vilanterol (BREO ELLIPTA) 100-25 MCG/INH AEPB Inhale 1 puff into the lungs daily. 28 each 0   furosemide (LASIX) 20 MG tablet TAKE 1 TABLET BY MOUTH TWO TIMES A WEEK 26 tablet 5   Iron-FA-B Cmp-C-Biot-Probiotic (FUSION PLUS) CAPS One capsule twice daily with orange juice. Stop your Ferrous sulfate. (Patient taking differently: Take 1 capsule by mouth 2 (two) times daily. with orange juice. Stop your Ferrous sulfate.) 30 capsule 1   levothyroxine (SYNTHROID) 100 MCG tablet Take 1 tablet (100 mcg total) by mouth daily. 90 tablet 0   levothyroxine (SYNTHROID) 137 MCG tablet Take 1 tablet (137 mcg total) by mouth daily before breakfast. 90 tablet 0   LORazepam (ATIVAN) 0.5 MG tablet Take 1 tablet (0.5 mg total) by mouth every 8 (eight) hours as needed for anxiety. 30 tablet 1   lurasidone (LATUDA) 20 MG TABS tablet Take 1 tablet (20 mg total) by mouth daily with supper for 7 days. 7 tablet 0   lurasidone (LATUDA) 40 MG TABS tablet Take 1 tablet (40 mg total) by mouth daily with supper. 28 tablet 0   methotrexate (RHEUMATREX) 2.5 MG tablet Take 6 tablets (15 mg total) by mouth once a week. Caution:Chemotherapy. Protect from light. 24 tablet 6   Multiple Vitamins-Minerals (MULTIVITAMIN WITH MINERALS) tablet Take 1 tablet by mouth daily.     nystatin cream (MYCOSTATIN) Apply 1 application topically 2 (two) times daily. Apply to affected area BID for up to 7 days. 30 g 1   omeprazole (PRILOSEC) 20 MG capsule TAKE TWO CAPSULES BY MOUTH DAILY 60 capsule 0   potassium chloride SA (KLOR-CON) 20 MEQ tablet Take 1 tablet (20 mEq total) by mouth 2 (two) times a week. 26 tablet 0   propranolol (INDERAL) 20 MG tablet Take 1 tablet (20 mg total) by mouth 3 (three) times daily. 270 tablet 3   sertraline (ZOLOFT) 100 MG tablet Take 2 tablets (200 mg total) by mouth daily. 180 tablet 1   topiramate (TOPAMAX) 50 MG tablet Start with 50mg (1 tab) at bedtime. In 1 week increase to 100mg (2 tabs) at bedtime.  (Patient taking differently: Take 100 mg by mouth at bedtime.) 180 tablet 6   Vitamin D, Ergocalciferol, (DRISDOL) 1.25 MG (50000 UNIT) CAPS capsule Take 1 capsule (50,000 Units total) by mouth 2 (two) times a week. 10 capsule 0   gabapentin (NEURONTIN) 300 MG capsule Take 1 capsule (300 mg total) by mouth at bedtime. 90 capsule 0   lamoTRIgine (LAMICTAL) 200 MG tablet Take 1 tablet (200 mg total) by mouth daily. 90 tablet 0   No current facility-administered medications for this visit.    Medication Side Effects: None  Allergies:  Allergies  Allergen Reactions   Augmentin [Amoxicillin-Pot Clavulanate] Hives    Past Medical History:  Diagnosis Date   Acid reflux    ADD (attention deficit disorder)    Anemia    Anxiety    Arthritis    left ankle   Back pain    Chest pain    Complication of anesthesia    Depression    Dyspnea    with exertion   Exercise-induced asthma    as a child   Fatty liver    History of blood transfusion  History of chicken pox    History of kidney stones    passed    Hypothyroidism    Joint pain    Lower extremity edema    Major depressive disorder    Migraines    chronic   Palpitations    Pneumonia    x 2   PONV (postoperative nausea and vomiting)    nausea   RLS (restless legs syndrome)    Sleep apnea 2021   Thyroid disease    hypothyroid    Past Medical History, Surgical history, Social history, and Family history were reviewed and updated as appropriate.   Please see review of systems for further details on the patient's review from today.   Objective:   Physical Exam:  LMP 11/21/2020   Physical Exam Constitutional:      General: She is not in acute distress. Musculoskeletal:        General: No deformity.  Neurological:     Mental Status: She is alert and oriented to person, place, and time.     Coordination: Coordination normal.  Psychiatric:        Attention and Perception: Attention and perception normal. She does  not perceive auditory or visual hallucinations.        Mood and Affect: Mood is anxious and depressed. Affect is not labile, blunt, angry or inappropriate.        Speech: Speech normal.        Behavior: Behavior normal.        Thought Content: Thought content normal. Thought content is not paranoid or delusional. Thought content does not include homicidal or suicidal ideation. Thought content does not include homicidal or suicidal plan.        Cognition and Memory: Cognition and memory normal.        Judgment: Judgment normal.     Comments: Insight intact    Lab Review:     Component Value Date/Time   NA 138 11/02/2020 1437   K 4.0 11/02/2020 1437   CL 104 11/02/2020 1437   CO2 23 11/02/2020 1437   GLUCOSE 90 11/02/2020 1437   BUN 8 11/02/2020 1437   CREATININE 0.78 11/02/2020 1437   CREATININE 0.74 09/30/2019 1047   CREATININE 0.75 07/06/2016 1534   CALCIUM 8.7 11/02/2020 1437   PROT 7.3 11/02/2020 1437   ALBUMIN 4.3 11/02/2020 1437   AST 29 11/02/2020 1437   AST 27 09/30/2019 1047   ALT 20 11/02/2020 1437   ALT 14 09/30/2019 1047   ALKPHOS 85 11/02/2020 1437   BILITOT 0.7 11/02/2020 1437   BILITOT 0.4 09/30/2019 1047   GFRNONAA >60 06/28/2020 0703   GFRNONAA >60 09/30/2019 1047   GFRAA >60 09/30/2019 1047       Component Value Date/Time   WBC 7.1 11/02/2020 1437   RBC 4.65 11/02/2020 1437   HGB 14.2 11/02/2020 1437   HGB 14.0 08/23/2020 1106   HGB 9.3 (L) 08/26/2019 1045   HCT 42.3 11/02/2020 1437   HCT 33.4 (L) 08/26/2019 1045   PLT 163.0 11/02/2020 1437   PLT 177 08/23/2020 1106   PLT 234 08/26/2019 1045   MCV 91.0 11/02/2020 1437   MCV 72 (L) 08/26/2019 1045   MCH 29.3 08/23/2020 1106   MCHC 33.5 11/02/2020 1437   RDW 15.5 11/02/2020 1437   RDW 15.4 08/26/2019 1045   LYMPHSABS 1.7 11/02/2020 1437   MONOABS 0.4 11/02/2020 1437   EOSABS 0.2 11/02/2020 1437   BASOSABS 0.0 11/02/2020 1437  No results found for: POCLITH, LITHIUM   No results found for:  PHENYTOIN, PHENOBARB, VALPROATE, CBMZ   .res Assessment: Plan:    Patient seen for 30 minutes and time spent discussing treatment options.  Discussed potential benefits, risks, and side effects of Latuda for augmentation of depression.  Discussed that some studies indicate that Latuda may be helpful for her major depressive disorder with mixed features and that she is currently experiencing severe depression with sleeplessness, irritability, and some racing thoughts.  Will start Latuda 20 mg daily with supper for 7 days, then increase to 40 mg daily with supper. Continue lamotrigine 200 mg daily for mood signs and symptoms. Continue gabapentin 300 mg at bedtime for anxiety and restless leg syndrome. Continue sertraline 200 mg daily for anxiety and depression. Continue Ativan 0.5 mg as needed for severe anxiety. Recommend continuing psychotherapy. Patient to follow-up with this provider in 4 weeks or sooner if clinically indicated. Patient advised to contact office with any questions, adverse effects, or acute worsening in signs and symptoms.   [name] was seen today for depression, anxiety and insomnia.  Diagnoses and all orders for this visit:  Severe episode of recurrent major depressive disorder, without psychotic features (HCC) -     lurasidone (LATUDA) 20 MG TABS tablet; Take 1 tablet (20 mg total) by mouth daily with supper for 7 days. -     lurasidone (LATUDA) 40 MG TABS tablet; Take 1 tablet (40 mg total) by mouth daily with supper.  Social phobia -     gabapentin (NEURONTIN) 300 MG capsule; Take 1 capsule (300 mg total) by mouth at bedtime.  Major depression, recurrent, chronic (HCC) -     gabapentin (NEURONTIN) 300 MG capsule; Take 1 capsule (300 mg total) by mouth at bedtime. -     lamoTRIgine (LAMICTAL) 200 MG tablet; Take 1 tablet (200 mg total) by mouth daily.  RLS (restless legs syndrome) -     gabapentin (NEURONTIN) 300 MG capsule; Take 1 capsule (300 mg total) by mouth  at bedtime.    Please see After Visit Summary for patient specific instructions.  Future Appointments  Date Time Provider Department Center  12/08/2020  1:00 PM Tobb, Kardie, DO [facility] None  12/15/2020 11:30 AM Butch PennyMillikan, Megan, NP [facility] None  12/26/2020  1:15 PM [facility]  12/29/2020  2:45 PM Corie Chiquitoarter, Fayth Trefry, PMHNP CP[facility]CP None  02/14/2021  1:45 PM [facility] None  02/14/2021  2:15 PM [place], Brand MalesSarah M, NP [facility] None  02/24/2021 11:30 AM [facility] None  02/24/2021 11:45 AM [place], Brand MalesSarah M, NP [facility] None  05/24/2021 11:00 AM [name]'Sullivan, Melissa, NP [facility]    No orders of the defined types were placed in this encounter.   -------------------------------

## 2020-12-08 ENCOUNTER — Encounter: Payer: Self-pay | Admitting: Cardiology

## 2020-12-08 ENCOUNTER — Ambulatory Visit (INDEPENDENT_AMBULATORY_CARE_PROVIDER_SITE_OTHER): Payer: BC Managed Care – PPO | Admitting: Cardiology

## 2020-12-08 ENCOUNTER — Other Ambulatory Visit: Payer: Self-pay

## 2020-12-08 VITALS — BP 130/84 | HR 88 | Ht 67.0 in | Wt 380.1 lb

## 2020-12-08 DIAGNOSIS — E782 Mixed hyperlipidemia: Secondary | ICD-10-CM | POA: Diagnosis not present

## 2020-12-08 DIAGNOSIS — I472 Ventricular tachycardia: Secondary | ICD-10-CM | POA: Diagnosis not present

## 2020-12-08 DIAGNOSIS — I10 Essential (primary) hypertension: Secondary | ICD-10-CM

## 2020-12-08 DIAGNOSIS — E662 Morbid (severe) obesity with alveolar hypoventilation: Secondary | ICD-10-CM

## 2020-12-08 DIAGNOSIS — I4729 Other ventricular tachycardia: Secondary | ICD-10-CM

## 2020-12-08 NOTE — Patient Instructions (Signed)
Medication Instructions:  Your physician recommends that you continue on your current medications as directed. Please refer to the Current Medication list given to you today.   *If you need a refill on your cardiac medications before your next appointment, please call your pharmacy*   Lab Work: None If you have labs (blood work) drawn today and your tests are completely normal, you will receive your results only by: MyChart Message (if you have MyChart) OR A paper copy in the mail If you have any lab test that is abnormal or we need to change your treatment, we will call you to review the results.   Testing/Procedures: None   Follow-Up: At [facility], you and your health needs are our priority.  As part of our continuing mission to provide you with exceptional heart care, we have created designated Provider Care Teams.  These Care Teams include your primary Cardiologist (physician) and Advanced Practice Providers (APPs -  Physician Assistants and Nurse Practitioners) who all work together to provide you with the care you need, when you need it.  We recommend signing up for the patient portal called "MyChart".  Sign up information is provided on this After Visit Summary.  MyChart is used to connect with patients for Virtual Visits (Telemedicine).  Patients are able to view lab/test results, encounter notes, upcoming appointments, etc.  Non-urgent messages can be sent to your provider as well.   To learn more about what you can do with MyChart, go to [email or web address].    Your next appointment:   1 year(s)  The format for your next appointment:   In Person  Provider:   Kardie Tobb, DO [address], [place], [place] [address]    Other Instructions   

## 2020-12-08 NOTE — Progress Notes (Signed)
Cardiology Office Note:    Date:  12/08/2020   ID:  Margaret Golden, DOB [birth year], MRN [id number]  PCP:  [name], NP  Cardiologist:  Thomasene Ripple, DO  Electrophysiologist:  None   Referring MD: [name], NP   " I am doing well from a heart stand point"  History of Present Illness:    Margaret Golden is a 30 y.o. female with a hx of  NSVT, anemia due to menorrhagia, GAD, MDD, dyspnea, hypothyroidism, and mediastinal lymphadenopathy.   I saw the patient on March 04, 2020 at that time we increased her propranolol to 20 mg daily.  She tells me this has been working for her.  She had had multiple pulmonary procedures including biopsy.  I saw the patient on 06/07/2020 she was doing well on propanolol.  Since her visit she has been started on antidepressants and methotrexate.   Past Medical History:  Diagnosis Date   Acid reflux    ADD (attention deficit disorder)    Anemia    Anxiety    Arthritis    left ankle   Back pain    Chest pain    Complication of anesthesia    Depression    Dyspnea    with exertion   Exercise-induced asthma    as a child   Fatty liver    History of blood transfusion    History of chicken pox    History of kidney stones    passed    Hypothyroidism    Joint pain    Lower extremity edema    Major depressive disorder    Migraines    chronic   Palpitations    Pneumonia    x 2   PONV (postoperative nausea and vomiting)    nausea   RLS (restless legs syndrome)    Sleep apnea 2021   Thyroid disease    hypothyroid    Past Surgical History:  Procedure Laterality Date   BRONCHIAL NEEDLE ASPIRATION BIOPSY  03/31/2020   Procedure: BRONCHIAL NEEDLE ASPIRATION BIOPSIES;  Surgeon: Josephine Igo, DO;  Location: [facility];  Service: Pulmonary;;   BRONCHIAL WASHINGS  03/31/2020   Procedure: BRONCHIAL WASHINGS;  Surgeon: Josephine Igo, DO;  Location: [facility];  Service: Pulmonary;;   DILATATION &  CURETTAGE/HYSTEROSCOPY WITH MYOSURE N/A 03/22/2020   Procedure: DILATATION & CURETTAGE/HYSTEROSCOPY WITH MYOSURE;  Surgeon: Romualdo Bolk, MD;  Location: [facility] OR;  Service: Gynecology;  Laterality: N/A;   INTERCOSTAL NERVE BLOCK Right 06/27/2020   Procedure: INTERCOSTAL NERVE BLOCK;  Surgeon: Corliss Skains, MD;  Location: [facility] OR;  Service: Thoracic;  Laterality: Right;   INTRAUTERINE DEVICE (IUD) INSERTION N/A 03/22/2020   Procedure: INTRAUTERINE DEVICE (IUD) INSERTION;  Surgeon: Romualdo Bolk, MD;  Location: [facility] OR;  Service: Gynecology;  Laterality: N/A;  Mirena IUD insertion   KNEE ARTHROSCOPY Right 2007   LYMPH NODE BIOPSY Right 06/27/2020   Procedure: LYMPH NODE BIOPSY;  Surgeon: Corliss Skains, MD;  Location: [facility] OR;  Service: Thoracic;  Laterality: Right;   TONSILLECTOMY  2010   TONSILLECTOMY     VIDEO BRONCHOSCOPY WITH ENDOBRONCHIAL ULTRASOUND N/A 03/31/2020   Procedure: VIDEO BRONCHOSCOPY WITH ENDOBRONCHIAL ULTRASOUND;  Surgeon: Josephine Igo, DO;  Location: [facility];  Service: Pulmonary;  Laterality: N/A;   WISDOM TOOTH EXTRACTION     WISDOM TOOTH EXTRACTION Bilateral    upper and lower    Current Medications: Current Meds  Medication Sig   aspirin-acetaminophen-caffeine (EXCEDRIN MIGRAINE)  250-250-65 MG tablet Take 2 tablets by mouth daily as needed for headache or migraine.   fluticasone furoate-vilanterol (BREO ELLIPTA) 100-25 MCG/INH AEPB Inhale 1 puff into the lungs daily.   furosemide (LASIX) 20 MG tablet TAKE 1 TABLET BY MOUTH TWO TIMES A WEEK   gabapentin (NEURONTIN) 300 MG capsule Take 1 capsule (300 mg total) by mouth at bedtime.   Iron-FA-B Cmp-C-Biot-Probiotic (FUSION PLUS) CAPS One capsule twice daily with orange juice. Stop your Ferrous sulfate. (Patient taking differently: Take 1 capsule by mouth 2 (two) times daily. with orange juice. Stop your Ferrous sulfate.)   lamoTRIgine (LAMICTAL) 200 MG tablet Take 1 tablet (200 mg total) by mouth  daily.   levothyroxine (SYNTHROID) 100 MCG tablet Take 1 tablet (100 mcg total) by mouth daily.   levothyroxine (SYNTHROID) 137 MCG tablet Take 1 tablet (137 mcg total) by mouth daily before breakfast.   LORazepam (ATIVAN) 0.5 MG tablet Take 1 tablet (0.5 mg total) by mouth every 8 (eight) hours as needed for anxiety.   lurasidone (LATUDA) 20 MG TABS tablet Take 1 tablet (20 mg total) by mouth daily with supper for 7 days.   lurasidone (LATUDA) 40 MG TABS tablet Take 1 tablet (40 mg total) by mouth daily with supper.   methotrexate (RHEUMATREX) 2.5 MG tablet Take 6 tablets (15 mg total) by mouth once a week. Caution:Chemotherapy. Protect from light.   Multiple Vitamins-Minerals (MULTIVITAMIN WITH MINERALS) tablet Take 1 tablet by mouth daily.   nystatin cream (MYCOSTATIN) Apply 1 application topically 2 (two) times daily. Apply to affected area BID for up to 7 days.   omeprazole (PRILOSEC) 20 MG capsule TAKE TWO CAPSULES BY MOUTH DAILY   potassium chloride SA (KLOR-CON) 20 MEQ tablet Take 1 tablet (20 mEq total) by mouth 2 (two) times a week.   propranolol (INDERAL) 20 MG tablet Take 1 tablet (20 mg total) by mouth 3 (three) times daily.   sertraline (ZOLOFT) 100 MG tablet Take 2 tablets (200 mg total) by mouth daily.   topiramate (TOPAMAX) 50 MG tablet Start with 50mg (1 tab) at bedtime. In 1 week increase to 100mg (2 tabs) at bedtime. (Patient taking differently: Take 100 mg by mouth at bedtime.)   Vitamin D, Ergocalciferol, (DRISDOL) 1.25 MG (50000 UNIT) CAPS capsule Take 1 capsule (50,000 Units total) by mouth 2 (two) times a week.     Allergies:   Augmentin [amoxicillin-pot clavulanate]   Social History   Socioeconomic History   Marital status: Single    Spouse name: Not on file   Number of children: 0   Years of education: 12   Highest education level: Not on file  Occupational History   Occupation: Conservation officer, historic buildingsullfillment employee  Tobacco Use   Smoking status: Never   Smokeless tobacco:  Never  Vaping Use   Vaping Use: Never used  Substance and Sexual Activity   Alcohol use: Not Currently    Alcohol/week: 0.0 standard drinks   Drug use: No   Sexual activity: Never    Comment: never sexually active  Other Topics Concern   Not on file  Social History Narrative   Unemployed   Lives alone --update 11/11/19   Seeking work   Only child   Dog 3   Lives in a one story house with father, has a cat-12/26/16-sjb      Caffeine: 2-4 cups/day   Social Determinants of Health   Financial Resource Strain: Not on file  Food Insecurity: Not on file  Transportation Needs: Not on file  Physical Activity: Not on file  Stress: Not on file  Social Connections: Not on file     Family History: The patient's family history includes Arthritis in her maternal grandfather, maternal grandmother, paternal grandfather, and paternal grandmother; Breast cancer in her maternal grandmother; Cancer in her paternal aunt and paternal uncle; Depression in her father and mother; Diabetes in her maternal grandfather; Obesity in her father and mother; Sleep apnea in her father. There is no history of Migraines.  ROS:   Review of Systems  Constitution: Negative for decreased appetite, fever and weight gain.  HENT: Negative for congestion, ear discharge, hoarse voice and sore throat.   Eyes: Negative for discharge, redness, vision loss in right eye and visual halos.  Cardiovascular: Negative for chest pain, dyspnea on exertion, leg swelling, orthopnea and palpitations.  Respiratory: Negative for cough, hemoptysis, shortness of breath and snoring.   Endocrine: Negative for heat intolerance and polyphagia.  Hematologic/Lymphatic: Negative for bleeding problem. Does not bruise/bleed easily.  Skin: Negative for flushing, nail changes, rash and suspicious lesions.  Musculoskeletal: Negative for arthritis, joint pain, muscle cramps, myalgias, neck pain and stiffness.  Gastrointestinal: Negative for abdominal  pain, bowel incontinence, diarrhea and excessive appetite.  Genitourinary: Negative for decreased libido, genital sores and incomplete emptying.  Neurological: Negative for brief paralysis, focal weakness, headaches and loss of balance.  Psychiatric/Behavioral: Negative for altered mental status, depression and suicidal ideas.  Allergic/Immunologic: Negative for HIV exposure and persistent infections.    EKGs/Labs/Other Studies Reviewed:    The following studies were reviewed today:   EKG:  None today  Coronary CTA August 2021 Coronary calcium score: The patient's coronary artery calcium score is 0, which places the patient in the 0 percentile.   Coronary arteries: Normal coronary origins.  Right dominance.   Right Coronary Artery: Dominant. Large vessel which gives of the R-PDA and R-PLB branches. No disease.   Left Main Coronary Artery: Normal. Bifurcates into the LAD and LCx arteries.   Left Anterior Descending Coronary Artery: Large artery that reaches the apex without disease. 2 smaller diagonal branches without disease.   Left Circumflex Artery: AV groove vessel with 2 small OM branches - no disease.   Aorta: Normal size, 30 mm at the mid ascending aorta (level of the PA bifurcation) measured double oblique. No calcifications. No dissection.   Aortic Valve: Trileaflet.  No calcifications.   Other findings:   Normal pulmonary vein drainage into the left atrium.   Normal left atrial appendage without a thrombus.   Normal size of the pulmonary artery.   Note significant non-cardiac findings as per radiology over-read.   IMPRESSION: 1. No evidence of CAD, CADRADS = 0.   2. Coronary calcium score of 0. This was 0 percentile for age and sex matched control.   3. Normal coronary origin with right dominance.   4. Please note significant radiology findings of adenopathy that may explain chest pain.  Recent Labs: 11/02/2020: ALT 20; BUN 8; Creatinine, Ser 0.78;  Hemoglobin 14.2; Platelets 163.0; Potassium 4.0; Sodium 138; TSH 13.76  Recent Lipid Panel    Component Value Date/Time   CHOL 150 09/03/2019 1503   TRIG 120 09/03/2019 1503   HDL 39 (L) 09/03/2019 1503   CHOLHDL 4 07/21/2019 1139   VLDL 36.6 07/21/2019 1139   LDLCALC 89 09/03/2019 1503    Physical Exam:    VS:  BP 130/84 (BP Location: Left Wrist, Patient Position: Sitting, Cuff Size: Large)   Pulse 88   Ht 5'  7" (1.702 m)   Wt (!) 380 lb 1.3 oz (172.4 kg)   LMP 11/21/2020   SpO2 99%   BMI 59.53 kg/m     Wt Readings from Last 3 Encounters:  12/08/20 (!) 380 lb 1.3 oz (172.4 kg)  11/21/20 (!) 374 lb (169.6 kg)  11/16/20 (!) 376 lb (170.6 kg)     GEN: Well nourished, well developed in no acute distress HEENT: Normal NECK: No JVD; No carotid bruits LYMPHATICS: No lymphadenopathy CARDIAC: S1S2 noted,RRR, no murmurs, rubs, gallops RESPIRATORY:  Clear to auscultation without rales, wheezing or rhonchi  ABDOMEN: Soft, non-tender, non-distended, +bowel sounds, no guarding. EXTREMITIES: No edema, No cyanosis, no clubbing MUSCULOSKELETAL:  No deformity  SKIN: Warm and dry NEUROLOGIC:  Alert and oriented x 3, non-focal PSYCHIATRIC:  Normal affect, good insight  ASSESSMENT:    1. NSVT (nonsustained ventricular tachycardia) (HCC)   2. Essential hypertension   3. Obesity with alveolar hypoventilation and body mass index (BMI) of 40 or greater (HCC)   4. Mixed hyperlipidemia    PLAN:     1.She is doing well from CV standpoint. No meds changes today.  She continues to follow pulmonary.  She tells me she is at a good place with her mental health and she is really happy.  The patient is in agreement with the above plan. The patient left the office in stable condition.  The patient will follow up in 1 year or sooner if needed.   Medication Adjustments/Labs and Tests Ordered: Current medicines are reviewed at length with the patient today.  Concerns regarding medicines are  outlined above.  No orders of the defined types were placed in this encounter.  No orders of the defined types were placed in this encounter.   Patient Instructions  Medication Instructions:  Your physician recommends that you continue on your current medications as directed. Please refer to the Current Medication list given to you today.  *If you need a refill on your cardiac medications before your next appointment, please call your pharmacy*   Lab Work: None If you have labs (blood work) drawn today and your tests are completely normal, you will receive your results only by: MyChart Message (if you have MyChart) OR A paper copy in the mail If you have any lab test that is abnormal or we need to change your treatment, we will call you to review the results.   Testing/Procedures: None   Follow-Up: At [facility], you and your health needs are our priority.  As part of our continuing mission to provide you with exceptional heart care, we have created designated Provider Care Teams.  These Care Teams include your primary Cardiologist (physician) and Advanced Practice Providers (APPs -  Physician Assistants and Nurse Practitioners) who all work together to provide you with the care you need, when you need it.  We recommend signing up for the patient portal called "MyChart".  Sign up information is provided on this After Visit Summary.  MyChart is used to connect with patients for Virtual Visits (Telemedicine).  Patients are able to view lab/test results, encounter notes, upcoming appointments, etc.  Non-urgent messages can be sent to your provider as well.   To learn more about what you can do with MyChart, go to [email or web address].    Your next appointment:   1 year(s)  The format for your next appointment:   In Person  Provider:   Thomasene Ripple, DO [address], [place], [place] [address]  Other Instructions     Adopting a Healthy Lifestyle.  Know what a  healthy weight is for you (roughly BMI <25) and aim to maintain this   Aim for 7+ servings of fruits and vegetables daily   65-80+ fluid ounces of water or unsweet tea for healthy kidneys   Limit to max 1 drink of alcohol per day; avoid smoking/tobacco   Limit animal fats in diet for cholesterol and heart health - choose grass fed whenever available   Avoid highly processed foods, and foods high in saturated/trans fats   Aim for low stress - take time to unwind and care for your mental health   Aim for 150 min of moderate intensity exercise weekly for heart health, and weights twice weekly for bone health   Aim for 7-9 hours of sleep daily   When it comes to diets, agreement about the perfect plan isnt easy to find, even among the experts. Experts at the [facility] of [employer] developed an idea known as the Healthy Eating Plate. Just imagine a plate divided into logical, healthy portions.   The emphasis is on diet quality:   Load up on vegetables and fruits - one-half of your plate: Aim for color and variety, and remember that potatoes dont count.   Go for whole grains - one-quarter of your plate: Whole wheat, barley, wheat berries, quinoa, oats, brown rice, and foods made with them. If you want pasta, go with whole wheat pasta.   Protein power - one-quarter of your plate: Fish, chicken, beans, and nuts are all healthy, versatile protein sources. Limit red meat.   The diet, however, does go beyond the plate, offering a few other suggestions.   Use healthy plant oils, such as olive, canola, soy, corn, sunflower and peanut. Check the labels, and avoid partially hydrogenated oil, which have unhealthy trans fats.   If youre thirsty, drink water. Coffee and tea are good in moderation, but skip sugary drinks and limit milk and dairy products to one or two daily servings.   The type of carbohydrate in the diet is more important than the amount. Some sources of carbohydrates,  such as vegetables, fruits, whole grains, and beans-are healthier than others.   Finally, stay active  Signed, Thomasene Ripple, DO  12/08/2020 1:25 PM    [facility]

## 2020-12-09 ENCOUNTER — Telehealth: Payer: Self-pay | Admitting: Pulmonary Disease

## 2020-12-09 NOTE — Telephone Encounter (Signed)
Called and spoke with patient. She stated that [facility] not [facility] her methotrexate correctly. She read the info on the bottle that stated she was take only 4 tablets instead of 6 tablets once weekly. She had not reached out to the pharmacy yet. I advised her that I would call the pharmacy.  Called [facility] and spoke with Misty Stanley. She stated that the patient had 2 prescriptions on file and they filled the one for 4 tablets. I advised her to void out the one for 4 tablets and fill the one with 6 tablets. Insurance will pay for the 6 tablets after 9/12.   Called and spoke with patient. She is aware of the above information.   Nothing further needed at time of call.

## 2020-12-12 DIAGNOSIS — F4323 Adjustment disorder with mixed anxiety and depressed mood: Secondary | ICD-10-CM | POA: Diagnosis not present

## 2020-12-12 DIAGNOSIS — G4733 Obstructive sleep apnea (adult) (pediatric): Secondary | ICD-10-CM | POA: Diagnosis not present

## 2020-12-15 ENCOUNTER — Ambulatory Visit: Payer: BC Managed Care – PPO | Admitting: Adult Health

## 2020-12-15 ENCOUNTER — Encounter: Payer: Self-pay | Admitting: Adult Health

## 2020-12-15 VITALS — BP 140/96 | HR 95 | Ht 67.0 in | Wt 380.8 lb

## 2020-12-15 DIAGNOSIS — G4733 Obstructive sleep apnea (adult) (pediatric): Secondary | ICD-10-CM

## 2020-12-15 DIAGNOSIS — G43709 Chronic migraine without aura, not intractable, without status migrainosus: Secondary | ICD-10-CM | POA: Diagnosis not present

## 2020-12-15 DIAGNOSIS — R519 Headache, unspecified: Secondary | ICD-10-CM | POA: Diagnosis not present

## 2020-12-15 NOTE — Progress Notes (Signed)
PATIENT: Margaret Golden DOB: [birth year]  REASON FOR VISIT: follow up HISTORY FROM: patient  HISTORY OF PRESENT ILLNESS: Today 12/15/20:  Margaret Golden is a 30 year old female with a history of migraine headaches and obstructive sleep apnea..  She returns today for follow-up.  The patient received a CPAP machine.  She only used it 2 nights out of the last 90 days for less than 30 minutes.  She states that when she puts it on the pressure is too high and caused her to have a migraine.  The patient's machine is set on auto 6-16.  According to her download the pressure never went above 5 cm of water.  Patient reports that she continues to have daily headaches.  Also reports shortness of breath for which she is seeing pulmonology and reports She needs to use her CPAP machine.  She returns today for an evaluation.  08/17/20: Margaret Golden is a 30 year old female with a history of migraine headaches.  She returns today for follow-up.  She states that her headaches have improved.  She has approximately 2-3 migraines a month.  She states Excedrin Migraine usually resolves her headaches.  She was unable to state how many regular headache she has each month.  The patient did do a sleep study that showed moderate to severe sleep apnea but titration study was never scheduled.  02/16/20: Margaret Golden is a 30 year old female with a history of migraines.  She returns today for follow-up.  She states that she wakes up at least 3 times a week with a headache.  Sometimes the headache gets better as the day goes on sometimes and lingers all day or may even get worse.  She states that with these headaches she does not have photophobia or phonophobia.  Reports some nausea but no vomiting.  She states approximately once every other week sometimes more often she will have a headache that feels like a tight headband around her head.  She denies photophobia and phonophobia.  Reports nausea.  She continues to take Topamax 100  mg at bedtime.  Reports that she tried Maxalt with no benefit.  She was referred for sleep study.  She had this last week and is awaiting results.  Returns today for evaluation.   HISTORY Margaret Golden is a 30 y.o. female here as requested by Quillian Quince, MD for migraines.  Past medical history thyroid disease, restless leg syndrome, migraines, major depressive disorder, lower extremity edema, joint pain, hypothyroidism, history of kidney stones, fatty liver, exercise-induced asthma, depression, back pain, arthritis, anxiety, ADD, HTN, obesity.  I reviewed Dr. Francena Hanly notes, patient reports chronic migraines, can have up to 10-14 headaches per month, she has been using over-the-counter Excedrin Migraine 250 mg 2 tablets as needed, she also has depression with anxiety, fatigue and low energy it may be related to obesity depression or many other causes including migraine, behavior modification was discussed and she is currently in the action stage of change for weight loss.    I was able to review neurologist Dr. Moises Blood notes he saw patient in 2019, patient reported 7 out of 10 intensity, 3-4 headache days a month which 1 to 2 days are migraines, no NSAIDs, she tried Excedrin Migraine in the past, she also tried sertraline and Topamax, lamotrigine and Horizant in the past as well, rare alcohol no smoker, depression controlled, migraine started as a teenager and became more frequent at age 70, bandlike distribution, pressure stabbing, no aura no prodrome no post  room, associated symptoms include photophobia, phonophobia, nausea, no vomiting or visual disturbance often, not waking her up from sleep, can last up to 3 days with a total of 7 headache days a month initially and improved to 3-4 headache days a month at follow-up appointment.  Neurologic exam was normal.  Topiramate was continued at that time.   Migraines have gotten worse, she has been managing with excedrin and aspirin, was taking once a week  but now worsening for >3 months and worsenined with anemia when she was hospitalized and migraines worsening. She is waking up with headaches, a few years ago she had a sleep test and it was negative, tired during the day, very fatigued, daily headaches in the morning and feels like a band around her head, migraines are pulsating/pounding/throbbing, photophobia/phonophobia, nausea, no vomiting, it hurts to move, she reports "slight double vision", laying down and sleeping helps, she is getting 14 migraine days a month, suffering all are moderate to severe, no medication overuse, no aura, can last 24-48 hours untreated if she catches them soon they can last a few hours. She had one that last 1.5 weeks in the past. She has associated vertigo/dizziness but no weakness or sensory changes. She drinks caffeine is trying to cut back but she has a caffeine "addiction" and we discussed rebound headache.    From a thorough review of records: Medication tried that can be used in migraine management include ibuprofen, Aleve, Tylenol, Fioricet, sumatriptan, Zofran, propranolol, Topamax, bupropion, Excedrin Migraine, lamotrigine, Horizant, metoprolol.   Reviewed notes, labs and imaging from outside physicians, which showed:   Ferritin 6, TSH 18.3, CMP unremarkable    REVIEW OF SYSTEMS: Out of a complete 14 system review of symptoms, the patient complains only of the following symptoms, and all other reviewed systems are negative.  FSS 61 ESS 6  ALLERGIES: Allergies  Allergen Reactions   Augmentin [Amoxicillin-Pot Clavulanate] Hives    HOME MEDICATIONS: Outpatient Medications Prior to Visit  Medication Sig Dispense Refill   aspirin-acetaminophen-caffeine (EXCEDRIN MIGRAINE) 250-250-65 MG tablet Take 2 tablets by mouth daily as needed for headache or migraine.     fluticasone furoate-vilanterol (BREO ELLIPTA) 100-25 MCG/INH AEPB Inhale 1 puff into the lungs daily. 28 each 0   furosemide (LASIX) 20 MG tablet  TAKE 1 TABLET BY MOUTH TWO TIMES A WEEK 26 tablet 5   gabapentin (NEURONTIN) 300 MG capsule Take 1 capsule (300 mg total) by mouth at bedtime. 90 capsule 0   Iron-FA-B Cmp-C-Biot-Probiotic (FUSION PLUS) CAPS One capsule twice daily with orange juice. Stop your Ferrous sulfate. (Patient taking differently: Take 1 capsule by mouth 2 (two) times daily. with orange juice. Stop your Ferrous sulfate.) 30 capsule 1   lamoTRIgine (LAMICTAL) 200 MG tablet Take 1 tablet (200 mg total) by mouth daily. 90 tablet 0   levothyroxine (SYNTHROID) 100 MCG tablet Take 1 tablet (100 mcg total) by mouth daily. 90 tablet 0   levothyroxine (SYNTHROID) 137 MCG tablet Take 1 tablet (137 mcg total) by mouth daily before breakfast. 90 tablet 0   LORazepam (ATIVAN) 0.5 MG tablet Take 1 tablet (0.5 mg total) by mouth every 8 (eight) hours as needed for anxiety. 30 tablet 1   lurasidone (LATUDA) 40 MG TABS tablet Take 1 tablet (40 mg total) by mouth daily with supper. 28 tablet 0   methotrexate (RHEUMATREX) 2.5 MG tablet Take 6 tablets (15 mg total) by mouth once a week. Caution:Chemotherapy. Protect from light. 24 tablet 6   Multiple Vitamins-Minerals (  MULTIVITAMIN WITH MINERALS) tablet Take 1 tablet by mouth daily.     nystatin cream (MYCOSTATIN) Apply 1 application topically 2 (two) times daily. Apply to affected area BID for up to 7 days. 30 g 1   omeprazole (PRILOSEC) 20 MG capsule TAKE TWO CAPSULES BY MOUTH DAILY 60 capsule 0   potassium chloride SA (KLOR-CON) 20 MEQ tablet Take 1 tablet (20 mEq total) by mouth 2 (two) times a week. 26 tablet 0   propranolol (INDERAL) 20 MG tablet Take 1 tablet (20 mg total) by mouth 3 (three) times daily. 270 tablet 3   sertraline (ZOLOFT) 100 MG tablet Take 2 tablets (200 mg total) by mouth daily. 180 tablet 1   topiramate (TOPAMAX) 50 MG tablet Start with 50mg (1 tab) at bedtime. In 1 week increase to 100mg (2 tabs) at bedtime. (Patient taking differently: Take 100 mg by mouth at bedtime.)  180 tablet 6   Vitamin D, Ergocalciferol, (DRISDOL) 1.25 MG (50000 UNIT) CAPS capsule Take 1 capsule (50,000 Units total) by mouth 2 (two) times a week. 10 capsule 0   lurasidone (LATUDA) 20 MG TABS tablet Take 1 tablet (20 mg total) by mouth daily with supper for 7 days. 7 tablet 0   No facility-administered medications prior to visit.    PAST MEDICAL HISTORY: Past Medical History:  Diagnosis Date   Acid reflux    ADD (attention deficit disorder)    Anemia    Anxiety    Arthritis    left ankle   Back pain    Chest pain    Complication of anesthesia    Depression    Dyspnea    with exertion   Exercise-induced asthma    as a child   Fatty liver    History of blood transfusion    History of chicken pox    History of kidney stones    passed    Hypothyroidism    Joint pain    Lower extremity edema    Major depressive disorder    Migraines    chronic   Palpitations    Pneumonia    x 2   PONV (postoperative nausea and vomiting)    nausea   RLS (restless legs syndrome)    Sleep apnea 2021   Thyroid disease    hypothyroid    PAST SURGICAL HISTORY: Past Surgical History:  Procedure Laterality Date   BRONCHIAL NEEDLE ASPIRATION BIOPSY  03/31/2020   Procedure: BRONCHIAL NEEDLE ASPIRATION BIOPSIES;  Surgeon: 2022, DO;  Location: [facility];  Service: Pulmonary;;   BRONCHIAL WASHINGS  03/31/2020   Procedure: BRONCHIAL WASHINGS;  Surgeon: Josephine Igo, DO;  Location: [facility];  Service: Pulmonary;;   DILATATION & CURETTAGE/HYSTEROSCOPY WITH MYOSURE N/A 03/22/2020   Procedure: DILATATION & CURETTAGE/HYSTEROSCOPY WITH MYOSURE;  Surgeon: Josephine Igo, MD;  Location: [facility] OR;  Service: Gynecology;  Laterality: N/A;   INTERCOSTAL NERVE BLOCK Right 06/27/2020   Procedure: INTERCOSTAL NERVE BLOCK;  Surgeon: [facility], MD;  Location: [facility] OR;  Service: Thoracic;  Laterality: Right;   INTRAUTERINE DEVICE (IUD) INSERTION N/A 03/22/2020    Procedure: INTRAUTERINE DEVICE (IUD) INSERTION;  Surgeon: Corliss Skains, MD;  Location: [facility] OR;  Service: Gynecology;  Laterality: N/A;  Mirena IUD insertion   KNEE ARTHROSCOPY Right 2007   LYMPH NODE BIOPSY Right 06/27/2020   Procedure: LYMPH NODE BIOPSY;  Surgeon: 2008, MD;  Location: [facility] OR;  Service: Thoracic;  Laterality: Right;   TONSILLECTOMY  2010  TONSILLECTOMY     VIDEO BRONCHOSCOPY WITH ENDOBRONCHIAL ULTRASOUND N/A 03/31/2020   Procedure: VIDEO BRONCHOSCOPY WITH ENDOBRONCHIAL ULTRASOUND;  Surgeon: Josephine Igo, DO;  Location: [facility];  Service: Pulmonary;  Laterality: N/A;   WISDOM TOOTH EXTRACTION     WISDOM TOOTH EXTRACTION Bilateral    upper and lower    FAMILY HISTORY: Family History  Problem Relation Age of Onset   Depression Mother    Obesity Mother        died from "an infection" ? MRSA   Depression Father    Sleep apnea Father    Obesity Father    Arthritis Maternal Grandmother    Breast cancer Maternal Grandmother        in remission   Diabetes Maternal Grandfather    Arthritis Maternal Grandfather    Arthritis Paternal Grandmother    Arthritis Paternal Grandfather    Cancer Paternal Aunt        colon   Cancer Paternal Uncle        brain cancer   Migraines Neg Hx     SOCIAL HISTORY: Social History   Socioeconomic History   Marital status: Single    Spouse name: Not on file   Number of children: 0   Years of education: 12   Highest education level: Not on file  Occupational History   Occupation: Conservation officer, historic buildings  Tobacco Use   Smoking status: Never    Passive exposure: Never   Smokeless tobacco: Never  Vaping Use   Vaping Use: Never used  Substance and Sexual Activity   Alcohol use: Not Currently    Alcohol/week: 0.0 standard drinks   Drug use: No   Sexual activity: Never    Comment: never sexually active  Other Topics Concern   Not on file  Social History Narrative   Unemployed   Lives alone --update  11/11/19   Seeking work   Only child   Dog 3   Lives in a one story house with father, has a cat-12/26/16-sjb      Caffeine: 2-4 cups/day   Social Determinants of Corporate investment banker Strain: Not on file  Food Insecurity: Not on file  Transportation Needs: Not on file  Physical Activity: Not on file  Stress: Not on file  Social Connections: Not on file  Intimate Partner Violence: Not on file      PHYSICAL EXAM  Vitals:   12/15/20 1109  BP: (!) 140/96  Pulse: 95  Weight: (!) 380 lb 12.8 oz (172.7 kg)  Height: 5\' 7"  (1.702 m)   Body mass index is 59.64 kg/m.  Generalized: Well developed, in no acute distress   Neurological examination  Mentation: Alert oriented to time, place, history taking. Follows all commands speech and language fluent Cranial nerve II-XII: Pupils were equal round reactive to light. Extraocular movements were full, visual field were full on confrontational test. Facial sensation and strength were normal. Uvula tongue midline. Head turning and shoulder shrug  were normal and symmetric. Motor: The motor testing reveals 5 over 5 strength of all 4 extremities. Good symmetric motor tone is noted throughout.  Sensory: Sensory testing is intact to soft touch on all 4 extremities. No evidence of extinction is noted.  Coordination: Cerebellar testing reveals good finger-nose-finger and heel-to-shin bilaterally.  Gait and station: Gait is normal.  Reflexes: Deep tendon reflexes are symmetric and normal bilaterally.   DIAGNOSTIC DATA (LABS, IMAGING, TESTING) - I reviewed patient records, labs, notes, testing and imaging myself  where available.  Lab Results  Component Value Date   WBC 7.1 11/02/2020   HGB 14.2 11/02/2020   HCT 42.3 11/02/2020   MCV 91.0 11/02/2020   PLT 163.0 11/02/2020      Component Value Date/Time   NA 138 11/02/2020 1437   K 4.0 11/02/2020 1437   CL 104 11/02/2020 1437   CO2 23 11/02/2020 1437   GLUCOSE 90 11/02/2020 1437    BUN 8 11/02/2020 1437   CREATININE 0.78 11/02/2020 1437   CREATININE 0.74 09/30/2019 1047   CREATININE 0.75 07/06/2016 1534   CALCIUM 8.7 11/02/2020 1437   PROT 7.3 11/02/2020 1437   ALBUMIN 4.3 11/02/2020 1437   AST 29 11/02/2020 1437   AST 27 09/30/2019 1047   ALT 20 11/02/2020 1437   ALT 14 09/30/2019 1047   ALKPHOS 85 11/02/2020 1437   BILITOT 0.7 11/02/2020 1437   BILITOT 0.4 09/30/2019 1047   GFRNONAA >60 06/28/2020 0703   GFRNONAA >60 09/30/2019 1047   GFRAA >60 09/30/2019 1047   Lab Results  Component Value Date   CHOL 150 09/03/2019   HDL 39 (L) 09/03/2019   LDLCALC 89 09/03/2019   TRIG 120 09/03/2019   CHOLHDL 4 07/21/2019   Lab Results  Component Value Date   HGBA1C 4.9 09/03/2019   Lab Results  Component Value Date   VITAMINB12 473 09/03/2019   Lab Results  Component Value Date   TSH 13.76 (H) 11/02/2020      ASSESSMENT AND PLAN 30 y.o. year old female  has a past medical history of Acid reflux, ADD (attention deficit disorder), Anemia, Anxiety, Arthritis, Back pain, Chest pain, Complication of anesthesia, Depression, Dyspnea, Exercise-induced asthma, Fatty liver, History of blood transfusion, History of chicken pox, History of kidney stones, Hypothyroidism, Joint pain, Lower extremity edema, Major depressive disorder, Migraines, Palpitations, Pneumonia, PONV (postoperative nausea and vomiting), RLS (restless legs syndrome), Sleep apnea (2021), and Thyroid disease. here with:  1.  Migraine headaches  -Continue Topamax 50 mg in the morning and 100 mg in the evening  2.  Morning headaches 3.  Obstructive sleep apnea on CPAP   Explained in great detail the benefits of using CPAP and the risk associated with untreated sleep apnea.  The patient was advised that her daily headaches could be a result of untreated sleep apnea. A mask refitting will be ordered for the patient Patient was encouraged to try to use the CPAP for 30 minutes during the day to  help her get used to using the machine. May consider a set pressure in the future  Follow-up in 4 to 5 months or sooner if needed  Butch Penny, MSN, NP-C 12/15/2020, 11:31 AM [facility] [address], [address] [place], [place] [address] [phone]

## 2020-12-21 DIAGNOSIS — F4323 Adjustment disorder with mixed anxiety and depressed mood: Secondary | ICD-10-CM | POA: Diagnosis not present

## 2020-12-26 ENCOUNTER — Other Ambulatory Visit (INDEPENDENT_AMBULATORY_CARE_PROVIDER_SITE_OTHER): Payer: BC Managed Care – PPO

## 2020-12-26 DIAGNOSIS — E039 Hypothyroidism, unspecified: Secondary | ICD-10-CM

## 2020-12-27 DIAGNOSIS — F4323 Adjustment disorder with mixed anxiety and depressed mood: Secondary | ICD-10-CM | POA: Diagnosis not present

## 2020-12-27 LAB — TSH: TSH: 1.22 u[IU]/mL (ref 0.35–5.50)

## 2020-12-29 ENCOUNTER — Encounter: Payer: Self-pay | Admitting: Psychiatry

## 2020-12-29 ENCOUNTER — Other Ambulatory Visit: Payer: Self-pay

## 2020-12-29 ENCOUNTER — Ambulatory Visit (INDEPENDENT_AMBULATORY_CARE_PROVIDER_SITE_OTHER): Payer: BC Managed Care – PPO | Admitting: Psychiatry

## 2020-12-29 DIAGNOSIS — F339 Major depressive disorder, recurrent, unspecified: Secondary | ICD-10-CM | POA: Diagnosis not present

## 2020-12-29 DIAGNOSIS — F332 Major depressive disorder, recurrent severe without psychotic features: Secondary | ICD-10-CM

## 2020-12-29 DIAGNOSIS — F4010 Social phobia, unspecified: Secondary | ICD-10-CM

## 2020-12-29 DIAGNOSIS — G2581 Restless legs syndrome: Secondary | ICD-10-CM

## 2020-12-29 MED ORDER — LURASIDONE HCL 40 MG PO TABS: 40.0000 mg | ORAL_TABLET | Freq: Every day | ORAL | 1 refills | Status: DC

## 2020-12-29 MED ORDER — GABAPENTIN 300 MG PO CAPS: 300.0000 mg | ORAL_CAPSULE | Freq: Every day | ORAL | 0 refills | Status: DC

## 2020-12-29 MED ORDER — LAMOTRIGINE 200 MG PO TABS: 200.0000 mg | ORAL_TABLET | Freq: Every day | ORAL | 0 refills | Status: DC

## 2020-12-29 NOTE — Progress Notes (Signed)
Margaret Golden [id number] 1990/08/28 30 y.o.  Subjective:   Patient ID:  Margaret Golden is a 30 y.o. (DOB [birth year]) female.  Chief Complaint:  Chief Complaint  Patient presents with   Follow-up    Mood disturbance, anxiety, sleep disturbance     HPI Margaret Golden presents to the office today for follow-up of depression, irritability, and anxiety. She notices increased depression with dreary weather, seasonal depression, and anniversaries of loses. "I'm not as sad as I would expect to be" since starting the Latuda. She is now able to better identify what triggers depression. She reports continued irritability but it is not triggered as easily. She has been going to a friend's house some. She has started going to a card store again. She reports "anxiety is still there." She reports that there was a situation when Lorazepam was not effective after having anxiety with being in a crowded place and felt claustrophobic. She reports that there was another situation where Lorazepam was ineffective even at 1 mg. She reports that she had to leave these situations. She reports that when she takes Lorazepam early enough it seems to be more effective. Denies any worsening in worry. Some worry about finances and her health.  She reports that she is feeling less hopeless and discouraged. Appetite remains low. She reports snacking instead of eating meals. Energy is low and typically is low this time of year. She has been trying to go to bed earlier and wake up earlier. She reports disturbed sleep with trying to wear cPap. Estimates sleeping 5-6 hours a night. Has been trying to go to bed around 8-10 pm. She reports that she has low motivation towards hygiene and hygiene has improved some. She reports motivation has improved for other things and has plans 7 days a week. She reports some distractibility and that she is able to focus on some things. Denies SI.   Working on getting a weighted stuffed animal  that is lavender scented since this may help calm her.   Past medication trials: Sertraline-helpful for depression and anxiety Wellbutrin- initially helpful and then was less helpful Lexapro-ineffective Cymbalta Pristiq Seroquel- does not recall any significant improvement Latuda Abilify Propranolol- prescribed for HR. Not helpful for anxiety Melatonin Vyvanse Adderall XR- not effective Concerta- Notices duration only lasts about 4 hours. No significant improvement with doses less than 54 mg.  [name]- May have caused n/v and poor concentration Horizant Lamictal-effective for mood Trazodone-effective for insomnia but causes some excessive drowsiness Rexulti- May have caused n/v and poor concentration Ativan- helpful Armodafinil- jaw locked  GAD-7    Flowsheet Row Office Visit from 11/16/2020 in [facility] at [employer]  Total GAD-7 Score 19      PHQ2-9    Flowsheet Row Office Visit from 11/16/2020 in [facility] Office Visit from 09/03/2019 in [facility] Nutrition from 02/10/2019 [facility] Office Visit from 05/02/2018 in [facility] at [facility] Office Visit from 04/19/2017 in [facility] Total Score 6 3 2 4 3   PHQ-9 Total Score 20 14 -- 15 16      Flowsheet Row Admission (Discharged) from 06/27/2020 in [facility] 4E CV SURGICAL PROGRESSIVE CARE Pre-Admission Testing 60 from 06/23/2020 in [facility] PREADMISSION TESTING [facility] CORE BIOPSY LYM NOD [facility] from 06/09/2020 in [facility] ULTRASOUND  C-SSRS RISK CATEGORY No Risk No Risk No Risk        Review of Systems:  Review of Systems  Respiratory:  Positive for chest tightness and shortness of breath.   Cardiovascular:  Positive for palpitations.  Musculoskeletal:  Negative for gait problem.   Neurological:  Positive for headaches.  Psychiatric/Behavioral:         Please refer to HPI   Medications: I have reviewed the patient's current medications.  Current Outpatient Medications  Medication Sig Dispense Refill   LORazepam (ATIVAN) 0.5 MG tablet Take 1 tablet (0.5 mg total) by mouth every 8 (eight) hours as needed for anxiety. 30 tablet 1   methotrexate (RHEUMATREX) 2.5 MG tablet Take 6 tablets (15 mg total) by mouth once a week. Caution:Chemotherapy. Protect from light. 24 tablet 6   Multiple Vitamins-Minerals (MULTIVITAMIN WITH MINERALS) tablet Take 1 tablet by mouth daily.     aspirin-acetaminophen-caffeine (EXCEDRIN MIGRAINE) 250-250-65 MG tablet Take 2 tablets by mouth daily as needed for headache or migraine.     fluticasone furoate-vilanterol (BREO ELLIPTA) 100-25 MCG/INH AEPB Inhale 1 puff into the lungs daily. 28 each 0   furosemide (LASIX) 20 MG tablet TAKE 1 TABLET BY MOUTH TWO TIMES A WEEK 26 tablet 5   gabapentin (NEURONTIN) 300 MG capsule Take 1 capsule (300 mg total) by mouth at bedtime. 90 capsule 0   Iron-FA-B Cmp-C-Biot-Probiotic (FUSION PLUS) CAPS One capsule twice daily with orange juice. Stop your Ferrous sulfate. (Patient taking differently: Take 1 capsule by mouth 2 (two) times daily. with orange juice. Stop your Ferrous sulfate.) 30 capsule 1   lamoTRIgine (LAMICTAL) 200 MG tablet Take 1 tablet (200 mg total) by mouth daily. 90 tablet 0   levothyroxine (SYNTHROID) 100 MCG tablet Take 1 tablet (100 mcg total) by mouth daily. 90 tablet 0   levothyroxine (SYNTHROID) 137 MCG tablet Take 1 tablet (137 mcg total) by mouth daily before breakfast. 90 tablet 0   lurasidone (LATUDA) 40 MG TABS tablet Take 1 tablet (40 mg total) by mouth daily with supper. 30 tablet 1   nystatin cream (MYCOSTATIN) Apply 1 application topically 2 (two) times daily. Apply to affected area BID for up to 7 days. 30 g 1   omeprazole (PRILOSEC) 20 MG capsule TAKE TWO CAPSULES BY MOUTH DAILY  60 capsule 0   potassium chloride SA (KLOR-CON) 20 MEQ tablet Take 1 tablet (20 mEq total) by mouth 2 (two) times a week. 26 tablet 0   propranolol (INDERAL) 20 MG tablet Take 1 tablet (20 mg total) by mouth 3 (three) times daily. 270 tablet 3   sertraline (ZOLOFT) 100 MG tablet Take 2 tablets (200 mg total) by mouth daily. 180 tablet 1   topiramate (TOPAMAX) 50 MG tablet Start with 50mg (1 tab) at bedtime. In 1 week increase to 100mg (2 tabs) at bedtime. (Patient taking differently: Take 100 mg by mouth at bedtime.) 180 tablet 6   Vitamin D, Ergocalciferol, (DRISDOL) 1.25 MG (50000 UNIT) CAPS capsule Take 1 capsule (50,000 Units total) by mouth 2 (two) times a week. 10 capsule 0   No current facility-administered medications for this visit.    Medication Side Effects: None  Allergies:  Allergies  Allergen Reactions   Augmentin [Amoxicillin-Pot Clavulanate] Hives    Past Medical History:  Diagnosis Date   Acid reflux    ADD (attention deficit disorder)    Anemia    Anxiety    Arthritis    left ankle   Back pain  Chest pain    Complication of anesthesia    Depression    Dyspnea    with exertion   Exercise-induced asthma    as a child   Fatty liver    History of blood transfusion    History of chicken pox    History of kidney stones    passed    Hypothyroidism    Joint pain    Lower extremity edema    Major depressive disorder    Migraines    chronic   Palpitations    Pneumonia    x 2   PONV (postoperative nausea and vomiting)    nausea   RLS (restless legs syndrome)    Sleep apnea 2021   Thyroid disease    hypothyroid    Past Medical History, Surgical history, Social history, and Family history were reviewed and updated as appropriate.   Please see review of systems for further details on the patient's review from today.   Objective:   Physical Exam:  There were no vitals taken for this visit.  Physical Exam Constitutional:      General: She is not in  acute distress. Musculoskeletal:        General: No deformity.  Neurological:     Mental Status: She is alert and oriented to person, place, and time.     Coordination: Coordination normal.  Psychiatric:        Attention and Perception: Attention and perception normal. She does not perceive auditory or visual hallucinations.        Mood and Affect: Affect is not labile, blunt, angry or inappropriate.        Speech: Speech normal.        Behavior: Behavior normal.        Thought Content: Thought content normal. Thought content is not paranoid or delusional. Thought content does not include homicidal or suicidal ideation. Thought content does not include homicidal or suicidal plan.        Cognition and Memory: Cognition and memory normal.        Judgment: Judgment normal.     Comments: Insight intact Mood presents as less anxious and less depressed    Lab Review:     Component Value Date/Time   NA 138 11/02/2020 1437   K 4.0 11/02/2020 1437   CL 104 11/02/2020 1437   CO2 23 11/02/2020 1437   GLUCOSE 90 11/02/2020 1437   BUN 8 11/02/2020 1437   CREATININE 0.78 11/02/2020 1437   CREATININE 0.74 09/30/2019 1047   CREATININE 0.75 07/06/2016 1534   CALCIUM 8.7 11/02/2020 1437   PROT 7.3 11/02/2020 1437   ALBUMIN 4.3 11/02/2020 1437   AST 29 11/02/2020 1437   AST 27 09/30/2019 1047   ALT 20 11/02/2020 1437   ALT 14 09/30/2019 1047   ALKPHOS 85 11/02/2020 1437   BILITOT 0.7 11/02/2020 1437   BILITOT 0.4 09/30/2019 1047   GFRNONAA >60 06/28/2020 0703   GFRNONAA >60 09/30/2019 1047   GFRAA >60 09/30/2019 1047       Component Value Date/Time   WBC 7.1 11/02/2020 1437   RBC 4.65 11/02/2020 1437   HGB 14.2 11/02/2020 1437   HGB 14.0 08/23/2020 1106   HGB 9.3 (L) 08/26/2019 1045   HCT 42.3 11/02/2020 1437   HCT 33.4 (L) 08/26/2019 1045   PLT 163.0 11/02/2020 1437   PLT 177 08/23/2020 1106   PLT 234 08/26/2019 1045   MCV 91.0 11/02/2020 1437   MCV 72 (L)  08/26/2019 1045    MCH 29.3 08/23/2020 1106   MCHC 33.5 11/02/2020 1437   RDW 15.5 11/02/2020 1437   RDW 15.4 08/26/2019 1045   LYMPHSABS 1.7 11/02/2020 1437   MONOABS 0.4 11/02/2020 1437   EOSABS 0.2 11/02/2020 1437   BASOSABS 0.0 11/02/2020 1437    No results found for: POCLITH, LITHIUM   No results found for: PHENYTOIN, PHENOBARB, VALPROATE, CBMZ   .res Assessment: Plan:   Will continue Latuda 40 mg po qd for mood s/s since she reports improved mood and anxiety s/s since initiation of Latuda.  Continue Lamictal 200 mg po qd for mood s/s.  Continue Gabapentin 300 mg po q evening for RLS and anxiety.  Continue Ativan 0.5 mg po q 8 hours prn anxiety.  Continue Sertraline 200 mg po qd for mood and anxiety s/s.  Recommend continuing therapy.  Pt to follow-up with this provider in 2 months or sooner if clinically indicated.  Patient advised to contact office with any questions, adverse effects, or acute worsening in signs and symptoms.   Natasa was seen today for follow-up.  Diagnoses and all orders for this visit:  Severe episode of recurrent major depressive disorder, without psychotic features (HCC) -     lurasidone (LATUDA) 40 MG TABS tablet; Take 1 tablet (40 mg total) by mouth daily with supper.  Social phobia -     gabapentin (NEURONTIN) 300 MG capsule; Take 1 capsule (300 mg total) by mouth at bedtime.  Major depression, recurrent, chronic (HCC) -     gabapentin (NEURONTIN) 300 MG capsule; Take 1 capsule (300 mg total) by mouth at bedtime. -     lamoTRIgine (LAMICTAL) 200 MG tablet; Take 1 tablet (200 mg total) by mouth daily.  RLS (restless legs syndrome) -     gabapentin (NEURONTIN) 300 MG capsule; Take 1 capsule (300 mg total) by mouth at bedtime.    Please see After Visit Summary for patient specific instructions.  Future Appointments  Date Time Provider Department Center  02/14/2021  1:45 PM [facility] None  02/14/2021  2:15 PM Erenest Blank, NP [facility] None   02/24/2021 11:30 AM [facility] None  02/24/2021 11:45 AM Erenest Blank, NP [facility] None  02/28/2021  1:00 PM Corie Chiquito, PMHNP [facility] None  04/27/2021  2:30 PM Butch Penny, NP [facility] None  05/24/2021 11:00 AM [name], NP [facility]    No orders of the defined types were placed in this encounter.   -------------------------------

## 2021-01-01 ENCOUNTER — Telehealth: Payer: Self-pay

## 2021-01-01 ENCOUNTER — Encounter: Payer: Self-pay | Admitting: Adult Health

## 2021-01-01 NOTE — Telephone Encounter (Signed)
Prior Authorization submitted and approved for LATUDA 40 MG effective 01/01/2021-12/31/2021 with [employer] ID# [id number]

## 2021-01-03 DIAGNOSIS — F4323 Adjustment disorder with mixed anxiety and depressed mood: Secondary | ICD-10-CM | POA: Diagnosis not present

## 2021-01-03 NOTE — Telephone Encounter (Signed)
Ross Ludwig, RN got it!   Thanks!      Previous Messages   ----- Message -----  From: Guy Begin, RN  Sent: 01/03/2021   2:35 PM EDT  To: Dimas Millin, Jari Favre  Subject: cpapmask                                       This pt is having trouble with mask, causing skin breakdown.  Can you see what can be done?  I did also give her the phone # as well. [phone].  Appreciate this.  Sandy RN   [name] A. Wandel  Female, 29 y.o., [birth year]  MRN:  [id number]

## 2021-01-10 DIAGNOSIS — F4323 Adjustment disorder with mixed anxiety and depressed mood: Secondary | ICD-10-CM | POA: Diagnosis not present

## 2021-01-11 DIAGNOSIS — G4733 Obstructive sleep apnea (adult) (pediatric): Secondary | ICD-10-CM | POA: Diagnosis not present

## 2021-01-17 DIAGNOSIS — F4323 Adjustment disorder with mixed anxiety and depressed mood: Secondary | ICD-10-CM | POA: Diagnosis not present

## 2021-01-24 DIAGNOSIS — F4323 Adjustment disorder with mixed anxiety and depressed mood: Secondary | ICD-10-CM | POA: Diagnosis not present

## 2021-01-31 DIAGNOSIS — F4323 Adjustment disorder with mixed anxiety and depressed mood: Secondary | ICD-10-CM | POA: Diagnosis not present

## 2021-02-06 DIAGNOSIS — F4323 Adjustment disorder with mixed anxiety and depressed mood: Secondary | ICD-10-CM | POA: Diagnosis not present

## 2021-02-13 DIAGNOSIS — F4323 Adjustment disorder with mixed anxiety and depressed mood: Secondary | ICD-10-CM | POA: Diagnosis not present

## 2021-02-14 ENCOUNTER — Encounter: Payer: Self-pay | Admitting: Family

## 2021-02-14 ENCOUNTER — Telehealth: Payer: Self-pay | Admitting: *Deleted

## 2021-02-14 ENCOUNTER — Other Ambulatory Visit: Payer: Self-pay

## 2021-02-14 ENCOUNTER — Inpatient Hospital Stay (HOSPITAL_BASED_OUTPATIENT_CLINIC_OR_DEPARTMENT_OTHER): Payer: BC Managed Care – PPO | Admitting: Family

## 2021-02-14 ENCOUNTER — Inpatient Hospital Stay: Payer: BC Managed Care – PPO | Attending: Hematology & Oncology

## 2021-02-14 VITALS — BP 128/81 | HR 85 | Temp 98.4°F | Resp 18 | Wt 385.8 lb

## 2021-02-14 DIAGNOSIS — D500 Iron deficiency anemia secondary to blood loss (chronic): Secondary | ICD-10-CM | POA: Diagnosis not present

## 2021-02-14 DIAGNOSIS — D869 Sarcoidosis, unspecified: Secondary | ICD-10-CM | POA: Insufficient documentation

## 2021-02-14 DIAGNOSIS — N920 Excessive and frequent menstruation with regular cycle: Secondary | ICD-10-CM

## 2021-02-14 DIAGNOSIS — Z79899 Other long term (current) drug therapy: Secondary | ICD-10-CM | POA: Insufficient documentation

## 2021-02-14 DIAGNOSIS — R0602 Shortness of breath: Secondary | ICD-10-CM | POA: Insufficient documentation

## 2021-02-14 LAB — CBC WITH DIFFERENTIAL (CANCER CENTER ONLY)
Abs Immature Granulocytes: 0.08 10*3/uL — ABNORMAL HIGH (ref 0.00–0.07)
Basophils Absolute: 0 10*3/uL (ref 0.0–0.1)
Basophils Relative: 0 %
Eosinophils Absolute: 0.2 10*3/uL (ref 0.0–0.5)
Eosinophils Relative: 2 %
HCT: 42 % (ref 36.0–46.0)
Hemoglobin: 14 g/dL (ref 12.0–15.0)
Immature Granulocytes: 1 %
Lymphocytes Relative: 21 %
Lymphs Abs: 2.1 10*3/uL (ref 0.7–4.0)
MCH: 30 pg (ref 26.0–34.0)
MCHC: 33.3 g/dL (ref 30.0–36.0)
MCV: 89.9 fL (ref 80.0–100.0)
Monocytes Absolute: 0.6 10*3/uL (ref 0.1–1.0)
Monocytes Relative: 7 %
Neutro Abs: 6.7 10*3/uL (ref 1.7–7.7)
Neutrophils Relative %: 69 %
Platelet Count: 150 10*3/uL (ref 150–400)
RBC: 4.67 MIL/uL (ref 3.87–5.11)
RDW: 15 % (ref 11.5–15.5)
WBC Count: 9.7 10*3/uL (ref 4.0–10.5)
nRBC: 0 % (ref 0.0–0.2)

## 2021-02-14 LAB — RETICULOCYTES
Immature Retic Fract: 23 % — ABNORMAL HIGH (ref 2.3–15.9)
RBC.: 4.71 MIL/uL (ref 3.87–5.11)
Retic Count, Absolute: 191.7 10*3/uL — ABNORMAL HIGH (ref 19.0–186.0)
Retic Ct Pct: 4.1 % — ABNORMAL HIGH (ref 0.4–3.1)

## 2021-02-14 NOTE — Progress Notes (Signed)
Hematology and Oncology Follow Up Visit  Margaret Golden [id number] 06-28-1990 29 y.o. 02/14/2021   Principle Diagnosis:  Iron deficiency anemia secondary to heavy irregular cycles   Current Therapy:        IV iron as indicated    Interim History:  Margaret Golden is here today for follow-up. She is is doing fairly well but notes persistent fatigue.  She has SOB and chest discomfort with Sarcoidosis.  Her cycle with the IUD has been regular and flow not heavy. No other blood loss noted. No abnormal bruising or petechiae.   No fever, chills, n/v, cough, rash, palpitations, abdominal pain or changes in bowel or bladder habits at this time.  No numbness or tingling in her extremities at this time.  Chronic swelling in her lower extremities is unchanged from baseline. Pedal pulses are 2+.  No falls or syncope to report.  She is eating well and doing her best to stay well hydrated. Her weight is stable at 385 lbs.    ECOG Performance Status: 1 - Symptomatic but completely ambulatory  Medications:  Allergies as of 02/14/2021       Reactions   Augmentin [amoxicillin-pot Clavulanate] Hives        Medication List        Accurate as of February 14, 2021  2:49 PM. If you have any questions, ask your nurse or doctor.          aspirin-acetaminophen-caffeine 250-250-65 MG tablet Commonly known as: EXCEDRIN MIGRAINE Take 2 tablets by mouth daily as needed for headache or migraine.   fluticasone furoate-vilanterol 100-25 MCG/INH Aepb Commonly known as: Breo Ellipta Inhale 1 puff into the lungs daily.   furosemide 20 MG tablet Commonly known as: LASIX TAKE 1 TABLET BY MOUTH TWO TIMES A WEEK   Fusion Plus Caps One capsule twice daily with orange juice. Stop your Ferrous sulfate. What changed:  how much to take how to take this when to take this additional instructions   gabapentin 300 MG capsule Commonly known as: Neurontin Take 1 capsule (300 mg total) by mouth at  bedtime.   lamoTRIgine 200 MG tablet Commonly known as: LAMICTAL Take 1 tablet (200 mg total) by mouth daily.   levothyroxine 100 MCG tablet Commonly known as: SYNTHROID Take 1 tablet (100 mcg total) by mouth daily.   levothyroxine 137 MCG tablet Commonly known as: Synthroid Take 1 tablet (137 mcg total) by mouth daily before breakfast.   LORazepam 0.5 MG tablet Commonly known as: Ativan Take 1 tablet (0.5 mg total) by mouth every 8 (eight) hours as needed for anxiety.   lurasidone 40 MG Tabs tablet Commonly known as: Latuda Take 1 tablet (40 mg total) by mouth daily with supper.   methotrexate 2.5 MG tablet Commonly known as: RHEUMATREX Take 6 tablets (15 mg total) by mouth once a week. Caution:Chemotherapy. Protect from light.   multivitamin with minerals tablet Take 1 tablet by mouth daily.   nystatin cream Commonly known as: MYCOSTATIN Apply 1 application topically 2 (two) times daily. Apply to affected area BID for up to 7 days.   omeprazole 20 MG capsule Commonly known as: PRILOSEC TAKE TWO CAPSULES BY MOUTH DAILY   potassium chloride SA 20 MEQ tablet Commonly known as: KLOR-CON Take 1 tablet (20 mEq total) by mouth 2 (two) times a week.   propranolol 20 MG tablet Commonly known as: INDERAL Take 1 tablet (20 mg total) by mouth 3 (three) times daily.   sertraline 100 MG tablet  Commonly known as: ZOLOFT Take 2 tablets (200 mg total) by mouth daily.   topiramate 50 MG tablet Commonly known as: Topamax Start with 50mg (1 tab) at bedtime. In 1 week increase to 100mg (2 tabs) at bedtime. What changed:  how much to take how to take this when to take this additional instructions   Vitamin D (Ergocalciferol) 1.25 MG (50000 UNIT) Caps capsule Commonly known as: DRISDOL Take 1 capsule (50,000 Units total) by mouth 2 (two) times a week.        Allergies:  Allergies  Allergen Reactions   Augmentin [Amoxicillin-Pot Clavulanate] Hives    Past Medical  History, Surgical history, Social history, and Family History were reviewed and updated.  Review of Systems: All other 10 point review of systems is negative.   Physical Exam:  weight is 385 lb 12.8 oz (175 kg) (abnormal). Her oral temperature is 98.4 F (36.9 C). Her blood pressure is 137/103 (abnormal) and her pulse is 85. Her respiration is 18 and oxygen saturation is 98%.   Wt Readings from Last 3 Encounters:  02/14/21 (!) 385 lb 12.8 oz (175 kg)  12/15/20 (!) 380 lb 12.8 oz (172.7 kg)  12/08/20 (!) 380 lb 1.3 oz (172.4 kg)    Ocular: Sclerae unicteric, pupils equal, round and reactive to light Ear-nose-throat: Oropharynx clear, dentition fair Lymphatic: No cervical or supraclavicular adenopathy Lungs no rales or rhonchi, good excursion bilaterally Heart regular rate and rhythm, no murmur appreciated Abd soft, nontender, positive bowel sounds MSK no focal spinal tenderness, no joint edema Neuro: non-focal, well-oriented, appropriate affect Breasts: Deferred   Lab Results  Component Value Date   WBC 9.7 02/14/2021   HGB 14.0 02/14/2021   HCT 42.0 02/14/2021   MCV 89.9 02/14/2021   PLT 150 02/14/2021   Lab Results  Component Value Date   FERRITIN 72 08/23/2020   IRON 113 08/23/2020   TIBC 304 08/23/2020   UIBC 190 08/23/2020   IRONPCTSAT 37 08/23/2020   Lab Results  Component Value Date   RETICCTPCT 2.8 08/23/2020   RBC 4.67 02/14/2021   No results found for: KPAFRELGTCHN, LAMBDASER, KAPLAMBRATIO No results found for: IGGSERUM, IGA, IGMSERUM No results found for: Odetta Pink, SPEI   Chemistry      Component Value Date/Time   NA 138 11/02/2020 1437   K 4.0 11/02/2020 1437   CL 104 11/02/2020 1437   CO2 23 11/02/2020 1437   BUN 8 11/02/2020 1437   CREATININE 0.78 11/02/2020 1437   CREATININE 0.74 09/30/2019 1047   CREATININE 0.75 07/06/2016 1534      Component Value Date/Time   CALCIUM 8.7 11/02/2020  1437   ALKPHOS 85 11/02/2020 1437   AST 29 11/02/2020 1437   AST 27 09/30/2019 1047   ALT 20 11/02/2020 1437   ALT 14 09/30/2019 1047   BILITOT 0.7 11/02/2020 1437   BILITOT 0.4 09/30/2019 1047       Impression and Plan: Margaret Golden is a very pleasant 30 yo caucasian female with long history of iron deficiency anemia secondary to history of heavy irregular cycles. She also has sarcoidosis and is on treatment with Methotrexate.  Iron studies are pending. We will replace if needed.  Lab only in 6 months, follow-up in 1 year. She can contact our office with any questions or concerns.   Lottie Dawson, NP 11/15/20222:49 PM

## 2021-02-14 NOTE — Telephone Encounter (Signed)
Per 02/14/21 los - gave up coming appointments - confirmed

## 2021-02-15 ENCOUNTER — Other Ambulatory Visit: Payer: Self-pay | Admitting: Family

## 2021-02-15 LAB — FERRITIN: Ferritin: 179 ng/mL (ref 11–307)

## 2021-02-15 LAB — IRON AND TIBC
Iron: 67 ug/dL (ref 41–142)
Saturation Ratios: 19 % — ABNORMAL LOW (ref 21–57)
TIBC: 353 ug/dL (ref 236–444)
UIBC: 285 ug/dL (ref 120–384)

## 2021-02-17 ENCOUNTER — Telehealth: Payer: Self-pay | Admitting: Family

## 2021-02-22 ENCOUNTER — Telehealth: Payer: Self-pay | Admitting: *Deleted

## 2021-02-22 NOTE — Telephone Encounter (Signed)
Per scheduling message Maralyn Sago - called and left several messages to schedule (2) doses of IV Iron - requested call back to confirm

## 2021-02-24 ENCOUNTER — Ambulatory Visit: Payer: BC Managed Care – PPO | Admitting: Family

## 2021-02-24 ENCOUNTER — Other Ambulatory Visit: Payer: BC Managed Care – PPO

## 2021-02-27 ENCOUNTER — Ambulatory Visit: Payer: BC Managed Care – PPO | Admitting: Adult Health

## 2021-02-27 DIAGNOSIS — F4323 Adjustment disorder with mixed anxiety and depressed mood: Secondary | ICD-10-CM | POA: Diagnosis not present

## 2021-02-28 ENCOUNTER — Encounter: Payer: Self-pay | Admitting: Psychiatry

## 2021-02-28 ENCOUNTER — Other Ambulatory Visit: Payer: Self-pay

## 2021-02-28 ENCOUNTER — Ambulatory Visit (INDEPENDENT_AMBULATORY_CARE_PROVIDER_SITE_OTHER): Payer: BC Managed Care – PPO | Admitting: Psychiatry

## 2021-02-28 DIAGNOSIS — G2581 Restless legs syndrome: Secondary | ICD-10-CM | POA: Diagnosis not present

## 2021-02-28 DIAGNOSIS — F4010 Social phobia, unspecified: Secondary | ICD-10-CM

## 2021-02-28 DIAGNOSIS — F339 Major depressive disorder, recurrent, unspecified: Secondary | ICD-10-CM | POA: Diagnosis not present

## 2021-02-28 MED ORDER — SERTRALINE HCL 100 MG PO TABS: 200.0000 mg | ORAL_TABLET | Freq: Every day | ORAL | 1 refills | Status: DC

## 2021-02-28 MED ORDER — LAMOTRIGINE 200 MG PO TABS: 200.0000 mg | ORAL_TABLET | Freq: Every day | ORAL | 0 refills | Status: DC

## 2021-02-28 MED ORDER — GABAPENTIN 300 MG PO CAPS: 300.0000 mg | ORAL_CAPSULE | Freq: Every day | ORAL | 0 refills | Status: DC

## 2021-02-28 MED ORDER — LURASIDONE HCL 40 MG PO TABS: 40.0000 mg | ORAL_TABLET | Freq: Every day | ORAL | 0 refills | Status: DC

## 2021-02-28 NOTE — Progress Notes (Signed)
Margaret Golden [id number] [birth year] 30 y.o.  Subjective:   Patient ID:  Margaret Golden is a 30 y.o. (DOB 1990/08/01) female.  Chief Complaint:  Chief Complaint  Patient presents with   Follow-up    Anxiety, depression, insomnia     HPI Margaret Golden presents to the office today for follow-up of anxiety, depression, insomnia, and irritability. "Alright for the most part." Continues to deal with multiple medical issues. Reports that her mood is slightly lower and this is consistent with her baseline for this time of year. Anxiety is also higher this time of year and has some anxiety in response to financial stress. She reports that social anxiety prevents her from having a repairman in her home and is waiting for her father to come to visit before fixing her kitchen sink. Denies any recent panic. She reports that she occ has anxiety "that something bad is going to happen."   Sleep has been fair and is not always restful and restorative. Unable to tolerate cPap mask. Reports that she had irritation on her face from mask and fatigue and daytime somnolence worsened. She continues to experience frequent fatigue. Occ sleepiness. She uses caffeine to increase wakefulness. Appetite has been up and down. She reports that she is easily distracted. Occ hyper-focus, such as on new video game. Denies SI.   Father is coming to visit for the holidays.   Lavender scented animal has been helpful.   Continues to see therapist weekly.   Has not needed Lorazepam prn recently.  Past medication trials: Sertraline-helpful for depression and anxiety Wellbutrin- initially helpful and then was less helpful Lexapro-ineffective Cymbalta Pristiq Seroquel- does not recall any significant improvement Latuda Abilify Propranolol- prescribed for HR. Not helpful for anxiety Melatonin Vyvanse Adderall XR- not effective Concerta- Notices duration only lasts about 4 hours. No significant improvement with  doses less than 54 mg.  [name]- May have caused n/v and poor concentration Horizant Lamictal-effective for mood Trazodone-effective for insomnia but causes some excessive drowsiness Rexulti- May have caused n/v and poor concentration Ativan- helpful Armodafinil- jaw locked  GAD-7    Flowsheet Row Office Visit from 11/16/2020 in [facility]  Total GAD-7 Score 19      PHQ2-9    Flowsheet Row Office Visit from 11/16/2020 in [facility] Office Visit from 09/03/2019 in [facility] Nutrition from 02/10/2019 in [facility] Office Visit from 05/02/2018 in [facility] Office Visit from 04/19/2017 in [facility]  PHQ-2 Total Score 6 3 2 4 3   PHQ-9 Total Score 20 14 -- 15 16      Flowsheet Row Admission (Discharged) from 06/27/2020 in [facility] 4E CV SURGICAL PROGRESSIVE CARE Pre-Admission Testing 60 from 06/23/2020 in [facility] PREADMISSION TESTING [facility] CORE BIOPSY LYM NOD [facility] from 06/09/2020 in [facility] ULTRASOUND  C-SSRS RISK CATEGORY No Risk No Risk No Risk        Review of Systems:  Review of Systems  Respiratory:  Positive for chest tightness and shortness of breath.   Musculoskeletal:  Negative for gait problem.  Neurological:  Negative for tremors.  Psychiatric/Behavioral:         Please refer to HPI   Medications: I have reviewed the patient's current medications.  Current Outpatient Medications  Medication Sig Dispense Refill   Iron-FA-B  Cmp-C-Biot-Probiotic (FUSION PLUS) CAPS One capsule twice daily with orange juice. Stop your Ferrous sulfate. (Patient taking differently: Take 1 capsule by mouth 2 (two) times daily. with orange juice. Stop your Ferrous sulfate.) 30 capsule 1   aspirin-acetaminophen-caffeine (EXCEDRIN MIGRAINE)  250-250-65 MG tablet Take 2 tablets by mouth daily as needed for headache or migraine.     fluticasone furoate-vilanterol (BREO ELLIPTA) 100-25 MCG/INH AEPB Inhale 1 puff into the lungs daily. 28 each 0   furosemide (LASIX) 20 MG tablet TAKE 1 TABLET BY MOUTH TWO TIMES A WEEK 26 tablet 5   gabapentin (NEURONTIN) 300 MG capsule Take 1 capsule (300 mg total) by mouth at bedtime. 90 capsule 0   lamoTRIgine (LAMICTAL) 200 MG tablet Take 1 tablet (200 mg total) by mouth daily. 90 tablet 0   levothyroxine (SYNTHROID) 100 MCG tablet Take 1 tablet (100 mcg total) by mouth daily. 90 tablet 0   levothyroxine (SYNTHROID) 137 MCG tablet Take 1 tablet (137 mcg total) by mouth daily before breakfast. 90 tablet 0   LORazepam (ATIVAN) 0.5 MG tablet Take 1 tablet (0.5 mg total) by mouth every 8 (eight) hours as needed for anxiety. 30 tablet 1   lurasidone (LATUDA) 40 MG TABS tablet Take 1 tablet (40 mg total) by mouth daily with supper. 90 tablet 0   methotrexate (RHEUMATREX) 2.5 MG tablet Take 6 tablets (15 mg total) by mouth once a week. Caution:Chemotherapy. Protect from light. 24 tablet 6   Multiple Vitamins-Minerals (MULTIVITAMIN WITH MINERALS) tablet Take 1 tablet by mouth daily.     nystatin cream (MYCOSTATIN) Apply 1 application topically 2 (two) times daily. Apply to affected area BID for up to 7 days. (Patient not taking: Reported on 02/14/2021) 30 g 1   omeprazole (PRILOSEC) 20 MG capsule TAKE TWO CAPSULES BY MOUTH DAILY 60 capsule 0   potassium chloride SA (KLOR-CON) 20 MEQ tablet Take 1 tablet (20 mEq total) by mouth 2 (two) times a week. 26 tablet 0   propranolol (INDERAL) 20 MG tablet Take 1 tablet (20 mg total) by mouth 3 (three) times daily. 270 tablet 3   sertraline (ZOLOFT) 100 MG tablet Take 2 tablets (200 mg total) by mouth daily. 180 tablet 1   topiramate (TOPAMAX) 50 MG tablet Start with (1 tab) at bedtime. In 1 week increase to (2 tabs) at bedtime. (Patient taking differently: Take  100 mg by mouth at bedtime.) 180 tablet 6   Vitamin D, Ergocalciferol, (DRISDOL) 1.25 MG (50000 UNIT) CAPS capsule Take 1 capsule (50,000 Units total) by mouth 2 (two) times a week. 10 capsule 0   No current facility-administered medications for this visit.    Medication Side Effects: None  Allergies:  Allergies  Allergen Reactions   Augmentin [Amoxicillin-Pot Clavulanate] Hives    Past Medical History:  Diagnosis Date   Acid reflux    ADD (attention deficit disorder)    Anemia    Anxiety    Arthritis    left ankle   Back pain    Chest pain    Complication of anesthesia    Depression    Dyspnea    with exertion   Exercise-induced asthma    as a child   Fatty liver    History of blood transfusion    History of chicken pox    History of kidney stones    passed    Hypothyroidism    Joint pain    Lower extremity edema    Major depressive disorder  Migraines    chronic   Palpitations    Pneumonia    x 2   PONV (postoperative nausea and vomiting)    nausea   RLS (restless legs syndrome)    Sleep apnea 2021   Thyroid disease    hypothyroid    Past Medical History, Surgical history, Social history, and Family history were reviewed and updated as appropriate.   Please see review of systems for further details on the patient's review from today.   Objective:   Physical Exam:  There were no vitals taken for this visit.  Physical Exam Constitutional:      General: She is not in acute distress. Musculoskeletal:        General: No deformity.  Neurological:     Mental Status: She is alert and oriented to person, place, and time.     Coordination: Coordination normal.  Psychiatric:        Attention and Perception: Attention and perception normal. She does not perceive auditory or visual hallucinations.        Mood and Affect: Affect is not labile, blunt, angry or inappropriate.        Speech: Speech normal.        Behavior: Behavior normal.        Thought  Content: Thought content normal. Thought content is not paranoid or delusional. Thought content does not include homicidal or suicidal ideation. Thought content does not include homicidal or suicidal plan.        Cognition and Memory: Cognition and memory normal.        Judgment: Judgment normal.     Comments: Insight intact Mood is mildly anxious and depressed    Lab Review:     Component Value Date/Time   NA 138 11/02/2020 1437   K 4.0 11/02/2020 1437   CL 104 11/02/2020 1437   CO2 23 11/02/2020 1437   GLUCOSE 90 11/02/2020 1437   BUN 8 11/02/2020 1437   CREATININE 0.78 11/02/2020 1437   CREATININE 0.74 09/30/2019 1047   CREATININE 0.75 07/06/2016 1534   CALCIUM 8.7 11/02/2020 1437   PROT 7.3 11/02/2020 1437   ALBUMIN 4.3 11/02/2020 1437   AST 29 11/02/2020 1437   AST 27 09/30/2019 1047   ALT 20 11/02/2020 1437   ALT 14 09/30/2019 1047   ALKPHOS 85 11/02/2020 1437   BILITOT 0.7 11/02/2020 1437   BILITOT 0.4 09/30/2019 1047   GFRNONAA >60 06/28/2020 0703   GFRNONAA >60 09/30/2019 1047   GFRAA >60 09/30/2019 1047       Component Value Date/Time   WBC 9.7 02/14/2021 1349   WBC 7.1 11/02/2020 1437   RBC 4.67 02/14/2021 1349   RBC 4.71 02/14/2021 1349   HGB 14.0 02/14/2021 1349   HGB 9.3 (L) 08/26/2019 1045   HCT 42.0 02/14/2021 1349   HCT 33.4 (L) 08/26/2019 1045   PLT 150 02/14/2021 1349   PLT 234 08/26/2019 1045   MCV 89.9 02/14/2021 1349   MCV 72 (L) 08/26/2019 1045   MCH 30.0 02/14/2021 1349   MCHC 33.3 02/14/2021 1349   RDW 15.0 02/14/2021 1349   RDW 15.4 08/26/2019 1045   LYMPHSABS 2.1 02/14/2021 1349   MONOABS 0.6 02/14/2021 1349   EOSABS 0.2 02/14/2021 1349   BASOSABS 0.0 02/14/2021 1349    No results found for: POCLITH, LITHIUM   No results found for: PHENYTOIN, PHENOBARB, VALPROATE, CBMZ   .res Assessment: Plan:    Pt seen for 30 minutes and time spent discussing  current s/s and reviewing treatment plan. She reports that her mood and anxiety is  consistent with baseline for this time of year and that usually these s/s improve after the holidays. Will therefore not make any medication changes at this time and will continue to closely monitor mood and anxiety. Recommend continuing weekly therapy.  Continue Lamictal 200 mg po qd for mood s/s.  Continue Latuda 40 mg po qd for mood s/s.  Continue Lorazepam as needed. Has refill remaining on file.  Continue Sertraline 200 mg po qd for anxiety and depression.  Pt to follow-up in 2 months or sooner if clinically indicated.  Patient advised to contact office with any questions, adverse effects, or acute worsening in signs and symptoms.   Enisa was seen today for follow-up.  Diagnoses and all orders for this visit:  Major depression, recurrent, chronic (HCC) -     lamoTRIgine (LAMICTAL) 200 MG tablet; Take 1 tablet (200 mg total) by mouth daily. -     lurasidone (LATUDA) 40 MG TABS tablet; Take 1 tablet (40 mg total) by mouth daily with supper. -     sertraline (ZOLOFT) 100 MG tablet; Take 2 tablets (200 mg total) by mouth daily.  Social phobia -     gabapentin (NEURONTIN) 300 MG capsule; Take 1 capsule (300 mg total) by mouth at bedtime. -     sertraline (ZOLOFT) 100 MG tablet; Take 2 tablets (200 mg total) by mouth daily.  RLS (restless legs syndrome) -     gabapentin (NEURONTIN) 300 MG capsule; Take 1 capsule (300 mg total) by mouth at bedtime.    Please see After Visit Summary for patient specific instructions.  Future Appointments  Date Time Provider Department Center  04/27/2021  2:30 PM Butch Penny, NP [facility] None  05/04/2021  1:30 PM Corie Chiquito, PMHNP CP-CP None  05/24/2021 11:00 AM [name], NP [facility]  08/14/2021  2:00 PM [facility] None  02/14/2022  1:00 PM Erenest Blank, NP [facility] None    No orders of the defined types were placed in this encounter.   -------------------------------

## 2021-03-07 DIAGNOSIS — F4323 Adjustment disorder with mixed anxiety and depressed mood: Secondary | ICD-10-CM | POA: Diagnosis not present

## 2021-03-13 DIAGNOSIS — F4323 Adjustment disorder with mixed anxiety and depressed mood: Secondary | ICD-10-CM | POA: Diagnosis not present

## 2021-03-20 ENCOUNTER — Telehealth: Payer: Self-pay | Admitting: Family

## 2021-03-20 DIAGNOSIS — F4323 Adjustment disorder with mixed anxiety and depressed mood: Secondary | ICD-10-CM | POA: Diagnosis not present

## 2021-03-20 NOTE — Telephone Encounter (Signed)
Scheduling Message Entered by Corena Pilgrim on 02/15/2021 at  4:45 PM Priority: Routine <No visit type provided>  Department: [facility] POINT  Provider:   Scheduling Notes:  She needs 2 doses of Iv iron please. Thank you!  Called patient per 11/16 - no answer . Left message for patient to call back to schedule appt. Fourth attempt in contacting patient for scheduling.

## 2021-03-28 DIAGNOSIS — F4323 Adjustment disorder with mixed anxiety and depressed mood: Secondary | ICD-10-CM | POA: Diagnosis not present

## 2021-04-04 DIAGNOSIS — F4323 Adjustment disorder with mixed anxiety and depressed mood: Secondary | ICD-10-CM | POA: Diagnosis not present

## 2021-04-10 DIAGNOSIS — F4323 Adjustment disorder with mixed anxiety and depressed mood: Secondary | ICD-10-CM | POA: Diagnosis not present

## 2021-04-11 ENCOUNTER — Telehealth (INDEPENDENT_AMBULATORY_CARE_PROVIDER_SITE_OTHER): Payer: BC Managed Care – PPO | Admitting: Family

## 2021-04-11 DIAGNOSIS — E662 Morbid (severe) obesity with alveolar hypoventilation: Secondary | ICD-10-CM | POA: Diagnosis not present

## 2021-04-11 DIAGNOSIS — M545 Low back pain, unspecified: Secondary | ICD-10-CM | POA: Diagnosis not present

## 2021-04-11 MED ORDER — METHOCARBAMOL 500 MG PO TABS: 500.0000 mg | ORAL_TABLET | Freq: Three times a day (TID) | ORAL | 0 refills | Status: DC | PRN

## 2021-04-11 MED ORDER — METHYLPREDNISOLONE 4 MG PO TBPK: ORAL_TABLET | ORAL | 0 refills | Status: DC

## 2021-04-11 NOTE — Assessment & Plan Note (Signed)
We discussed risks/benefits of trial of medication such as mounjaro and she will think about it and let me know if she wishes to proceed.

## 2021-04-11 NOTE — Progress Notes (Signed)
MyChart Video Visit    Virtual Visit via Video Note   This visit type was conducted due to national recommendations for restrictions regarding the COVID-19 Pandemic (e.g. social distancing) in an effort to limit this patient's exposure and mitigate transmission in our community. This patient is at least at moderate risk for complications without adequate follow up. This format is felt to be most appropriate for this patient at this time. Physical exam was limited by quality of the video and audio technology used for the visit. CMA was able to get the patient set up on a video visit.  Patient location: Home. Patient and provider in visit Provider location: Office  I discussed the limitations of evaluation and management by telemedicine and the availability of in person appointments. The patient expressed understanding and agreed to proceed.  Visit Date: 04/11/2021  Today's healthcare provider: Lemont FillersMelissa S O'Sullivan, NP     Subjective:    Patient ID: Margaret Golden, female    DOB: 03/09/1991, 31 y.o.   MRN: [id number]  Chief Complaint  Patient presents with   Back Pain    Complains of lower back pain radiating to both hips   Neck Pain    Complains of neck pain radiating to both shoulders    HPI  Low back pain for 3 weeks.  No injury. Improved some since last week but still persistent.  Unable to stand.  Using otc ibuprofen/tylenol ice with temporary relief only.  Pain is across her lower back and into hips/waist.    Past Medical History:  Diagnosis Date   Acid reflux    ADD (attention deficit disorder)    Anemia    Anxiety    Arthritis    left ankle   Back pain    Chest pain    Complication of anesthesia    Depression    Dyspnea    with exertion   Exercise-induced asthma    as a child   Fatty liver    History of blood transfusion    History of chicken pox    History of kidney stones    passed    Hypothyroidism    Joint pain    Lower extremity edema     Major depressive disorder    Migraines    chronic   Palpitations    Pneumonia    x 2   PONV (postoperative nausea and vomiting)    nausea   RLS (restless legs syndrome)    Sleep apnea 2021   Thyroid disease    hypothyroid    Past Surgical History:  Procedure Laterality Date   BRONCHIAL NEEDLE ASPIRATION BIOPSY  03/31/2020   Procedure: BRONCHIAL NEEDLE ASPIRATION BIOPSIES;  Surgeon: Josephine IgoIcard, Bradley L, DO;  Location: [facility];  Service: Pulmonary;;   BRONCHIAL WASHINGS  03/31/2020   Procedure: BRONCHIAL WASHINGS;  Surgeon: Josephine IgoIcard, Bradley L, DO;  Location: [facility];  Service: Pulmonary;;   DILATATION & CURETTAGE/HYSTEROSCOPY WITH MYOSURE N/A 03/22/2020   Procedure: DILATATION & CURETTAGE/HYSTEROSCOPY WITH MYOSURE;  Surgeon: Romualdo BolkJertson, Jill Evelyn, MD;  [facility];  Service: Gynecology;  Laterality: N/A;   INTERCOSTAL NERVE BLOCK Right 06/27/2020   Procedure: INTERCOSTAL NERVE BLOCK;  Surgeon: Corliss SkainsLightfoot, Harrell O, MD;  Location: [facility] OR;  Service: Thoracic;  Laterality: Right;   INTRAUTERINE DEVICE (IUD) INSERTION N/A 03/22/2020   Procedure: INTRAUTERINE DEVICE (IUD) INSERTION;  Surgeon: Romualdo BolkJertson, Jill Evelyn, MD;  [facility];  Service: Gynecology;  Laterality: N/A;  Mirena IUD insertion  KNEE ARTHROSCOPY Right 2007   LYMPH NODE BIOPSY Right 06/27/2020   Procedure: LYMPH NODE BIOPSY;  Surgeon: Corliss SkainsLightfoot, Harrell O, MD;  Location: [facility] OR;  Service: Thoracic;  Laterality: Right;   TONSILLECTOMY  2010   TONSILLECTOMY     VIDEO BRONCHOSCOPY WITH ENDOBRONCHIAL ULTRASOUND N/A 03/31/2020   Procedure: VIDEO BRONCHOSCOPY WITH ENDOBRONCHIAL ULTRASOUND;  Surgeon: Josephine IgoIcard, Bradley L, DO;  Location: [facility];  Service: Pulmonary;  Laterality: N/A;   WISDOM TOOTH EXTRACTION     WISDOM TOOTH EXTRACTION Bilateral    upper and lower    Family History  Problem Relation Age of Onset   Depression Mother    Obesity Mother        died from "an infection" ? MRSA   Depression Father     Sleep apnea Father    Obesity Father    Arthritis Maternal Grandmother    Breast cancer Maternal Grandmother        in remission   Diabetes Maternal Grandfather    Arthritis Maternal Grandfather    Arthritis Paternal Grandmother    Arthritis Paternal Grandfather    Cancer Paternal Aunt        colon   Cancer Paternal Uncle        brain cancer   Migraines Neg Hx     Social History   Socioeconomic History   Marital status: Single    Spouse name: Not on file   Number of children: 0   Years of education: 12   Highest education level: Not on file  Occupational History   Occupation: Conservation officer, historic buildingsullfillment employee  Tobacco Use   Smoking status: Never    Passive exposure: Never   Smokeless tobacco: Never  Vaping Use   Vaping Use: Never used  Substance and Sexual Activity   Alcohol use: Not Currently    Alcohol/week: 0.0 standard drinks   Drug use: No   Sexual activity: Never    Comment: never sexually active  Other Topics Concern   Not on file  Social History Narrative   Unemployed   Lives alone --update 11/11/19   Seeking work   Only child   Dog 3   Lives in a one story house with father, has a cat-12/26/16-sjb      Caffeine: 2-4 cups/day   Social Determinants of Corporate investment bankerHealth   Financial Resource Strain: Not on file  Food Insecurity: Not on file  Transportation Needs: Not on file  Physical Activity: Not on file  Stress: Not on file  Social Connections: Not on file  Intimate Partner Violence: Not on file    Outpatient Medications Prior to Visit  Medication Sig Dispense Refill   aspirin-acetaminophen-caffeine (EXCEDRIN MIGRAINE) 250-250-65 MG tablet Take 2 tablets by mouth daily as needed for headache or migraine.     fluticasone furoate-vilanterol (BREO ELLIPTA) 100-25 MCG/INH AEPB Inhale 1 puff into the lungs daily. 28 each 0   furosemide (LASIX) 20 MG tablet TAKE 1 TABLET BY MOUTH TWO TIMES A WEEK 26 tablet 5   gabapentin (NEURONTIN) 300 MG capsule Take 1 capsule (300 mg  total) by mouth at bedtime. 90 capsule 0   Iron-FA-B Cmp-C-Biot-Probiotic (FUSION PLUS) CAPS One capsule twice daily with orange juice. Stop your Ferrous sulfate. (Patient taking differently: Take 1 capsule by mouth 2 (two) times daily. with orange juice. Stop your Ferrous sulfate.) 30 capsule 1   lamoTRIgine (LAMICTAL) 200 MG tablet Take 1 tablet (200 mg total) by mouth daily. 90 tablet 0   levothyroxine (SYNTHROID) 100 MCG  tablet Take 1 tablet (100 mcg total) by mouth daily. 90 tablet 0   levothyroxine (SYNTHROID) 137 MCG tablet Take 1 tablet (137 mcg total) by mouth daily before breakfast. 90 tablet 0   LORazepam (ATIVAN) 0.5 MG tablet Take 1 tablet (0.5 mg total) by mouth every 8 (eight) hours as needed for anxiety. 30 tablet 1   lurasidone (LATUDA) 40 MG TABS tablet Take 1 tablet (40 mg total) by mouth daily with supper. 90 tablet 0   methotrexate (RHEUMATREX) 2.5 MG tablet Take 6 tablets (15 mg total) by mouth once a week. Caution:Chemotherapy. Protect from light. 24 tablet 6   Multiple Vitamins-Minerals (MULTIVITAMIN WITH MINERALS) tablet Take 1 tablet by mouth daily.     nystatin cream (MYCOSTATIN) Apply 1 application topically 2 (two) times daily. Apply to affected area BID for up to 7 days. 30 g 1   omeprazole (PRILOSEC) 20 MG capsule TAKE TWO CAPSULES BY MOUTH DAILY 60 capsule 0   potassium chloride SA (KLOR-CON) 20 MEQ tablet Take 1 tablet (20 mEq total) by mouth 2 (two) times a week. 26 tablet 0   propranolol (INDERAL) 20 MG tablet Take 1 tablet (20 mg total) by mouth 3 (three) times daily. 270 tablet 3   sertraline (ZOLOFT) 100 MG tablet Take 2 tablets (200 mg total) by mouth daily. 180 tablet 1   topiramate (TOPAMAX) 50 MG tablet Start with 50mg (1 tab) at bedtime. In 1 week increase to 100mg (2 tabs) at bedtime. (Patient taking differently: Take 100 mg by mouth at bedtime.) 180 tablet 6   Vitamin D, Ergocalciferol, (DRISDOL) 1.25 MG (50000 UNIT) CAPS capsule Take 1 capsule (50,000 Units  total) by mouth 2 (two) times a week. 10 capsule 0   No facility-administered medications prior to visit.    Allergies  Allergen Reactions   Augmentin [Amoxicillin-Pot Clavulanate] Hives    ROS See HPI    Objective:    Physical Exam  There were no vitals taken for this visit. Wt Readings from Last 3 Encounters:  02/14/21 (!) 385 lb 12.8 oz (175 kg)  12/15/20 (!) 380 lb 12.8 oz (172.7 kg)  12/08/20 (!) 380 lb 1.3 oz (172.4 kg)    Gen: Awake, alert, no acute distress Resp: Breathing is even and non-labored Psych: calm/pleasant demeanor Neuro: Alert and Oriented x 3, + facial symmetry, speech is clear.     Assessment & Plan:   Problem List Items Addressed This Visit       Unprioritized   Obesity with alveolar hypoventilation and body mass index (BMI) of 40 or greater (HCC)    We discussed risks/benefits of trial of medication such as mounjaro and she will think about it and let me know if she wishes to proceed.       Acute bilateral low back pain without sciatica - Primary    Will give trial of medrol dose pak and prn robaxin.  She will reach out to me if symptoms worsen or if symptoms fail to improve.  Would plan referral to PT at that time.       Relevant Medications   methylPREDNISolone (MEDROL DOSEPAK) 4 MG TBPK tablet   methocarbamol (ROBAXIN) 500 MG tablet    I am having Nyisha A. Puello start on methylPREDNISolone and methocarbamol. I am also having her maintain her multivitamin with minerals, Fusion Plus, topiramate, potassium chloride SA, propranolol, aspirin-acetaminophen-caffeine, omeprazole, furosemide, Vitamin D (Ergocalciferol), fluticasone furoate-vilanterol, levothyroxine, LORazepam, levothyroxine, nystatin cream, methotrexate, lamoTRIgine, gabapentin, lurasidone, and sertraline.  Meds ordered this  encounter  Medications   methylPREDNISolone (MEDROL DOSEPAK) 4 MG TBPK tablet    Sig: Take per package instructions.    Dispense:  21 tablet    Refill:   0    Order Specific Question:   Supervising Provider    Answer:   Danise Edge A [[id number]]   methocarbamol (ROBAXIN) 500 MG tablet    Sig: Take 1 tablet (500 mg total) by mouth every 8 (eight) hours as needed for muscle spasms.    Dispense:  20 tablet    Refill:  0    Order Specific Question:   Supervising Provider    Answer:   Danise Edge A [[id number]]    I discussed the assessment and treatment plan with the patient. The patient was provided an opportunity to ask questions and all were answered. The patient agreed with the plan and demonstrated an understanding of the instructions.   The patient was advised to call back or seek an in-person evaluation if the symptoms worsen or if the condition fails to improve as anticipated.  Lemont Fillers, NP [employer] [facility] [phone] (phone) [phone] (fax)  [facility]

## 2021-04-11 NOTE — Assessment & Plan Note (Signed)
Will give trial of medrol dose pak and prn robaxin.  She will reach out to me if symptoms worsen or if symptoms fail to improve.  Would plan referral to PT at that time.

## 2021-04-20 DIAGNOSIS — F4323 Adjustment disorder with mixed anxiety and depressed mood: Secondary | ICD-10-CM | POA: Diagnosis not present

## 2021-04-27 ENCOUNTER — Ambulatory Visit: Payer: BC Managed Care – PPO | Admitting: Adult Health

## 2021-04-27 ENCOUNTER — Other Ambulatory Visit: Payer: Self-pay

## 2021-04-27 ENCOUNTER — Encounter: Payer: Self-pay | Admitting: Adult Health

## 2021-04-27 VITALS — BP 126/88 | HR 87 | Ht 67.0 in | Wt 388.0 lb

## 2021-04-27 DIAGNOSIS — R519 Headache, unspecified: Secondary | ICD-10-CM

## 2021-04-27 DIAGNOSIS — G4733 Obstructive sleep apnea (adult) (pediatric): Secondary | ICD-10-CM | POA: Diagnosis not present

## 2021-04-27 DIAGNOSIS — G43709 Chronic migraine without aura, not intractable, without status migrainosus: Secondary | ICD-10-CM

## 2021-04-27 NOTE — Patient Instructions (Signed)
Your Plan:  Ok to try to increase Topamax to 50 mg in the AM and 100 mg at bedtime. After 1-2 weeks if tolerating ok to increase to 100 mg twice a day Reconsider CPAP If your symptoms worsen or you develop new symptoms please let us know.      Thank you for coming to see [place] at [facility]. I hope we have been able to provide you high quality care today.  You may receive a patient satisfaction survey over the next few weeks. We would appreciate your feedback and comments so that we may continue to improve ourselves and the health of our patients.

## 2021-04-27 NOTE — Progress Notes (Signed)
PATIENT: Margaret Golden DOB: 25-Feb-1991  REASON FOR VISIT: follow up Margaret FROM: patient  Margaret OF PRESENT ILLNESS: Today 04/27/21: Margaret Golden is a 31 year old female with a Margaret of migraine headaches and obstructive sleep apnea.  She returns today for follow-up.  The patient did try using her CPAP.  The download is below.  When she was using it her apnea was well treated.  The patient states that she felt worse using the machine then when she was not using machine.  Therefore she turned the machine back in.  She also states that customer service with the DME company was bad.  She states that they were not very helpful with getting her a mask that she was comfortable with.  Patient reports that she continues to have daily headaches.  Typically wakes up with them.  Has approximate 1-2 migraines a week.  At the last visit she was instructed to increase Topamax but she does not think she has done this.  She will check her prescription at home  01/01/21: Margaret Golden is a 31 year old female with a Margaret of migraine headaches and obstructive sleep apnea..  She returns today for follow-up.  The patient received a CPAP machine.  She only used it 2 nights out of the last 90 days for less than 30 minutes.  She states that when she puts it on the pressure is too high and caused her to have a migraine.  The patient's machine is set on auto 6-16.  According to her download the pressure never went above 5 cm of water.  Patient reports that she continues to have daily headaches.  Also reports shortness of breath for which she is seeing pulmonology and reports She needs to use her CPAP machine.  She returns today for an evaluation.  08/17/20: Margaret Golden is a 31 year old female with a Margaret of migraine headaches.  She returns today for follow-up.  She states that her headaches have improved.  She has approximately 2-3 migraines a month.  She states Excedrin Migraine usually resolves her headaches.   She was unable to state how many regular headache she has each month.  The patient did do a sleep study that showed moderate to severe sleep apnea but titration study was never scheduled.  02/16/20: Margaret Golden is a 31 year old female with a Margaret of migraines.  She returns today for follow-up.  She states that she wakes up at least 3 times a week with a headache.  Sometimes the headache gets better as the day goes on sometimes and lingers all day or may even get worse.  She states that with these headaches she does not have photophobia or phonophobia.  Reports some nausea but no vomiting.  She states approximately once every other week sometimes more often she will have a headache that feels like a tight headband around her head.  She denies photophobia and phonophobia.  Reports nausea.  She continues to take Topamax 100 mg at bedtime.  Reports that she tried Maxalt with no benefit.  She was referred for sleep study.  She had this last week and is awaiting results.  Returns today for evaluation.   Margaret Golden is a 31 y.o. female here as requested by Margaret Nip, MD for migraines.  Past medical Margaret thyroid disease, restless leg syndrome, migraines, major depressive disorder, lower extremity edema, joint pain, hypothyroidism, Margaret of kidney stones, fatty liver, exercise-induced asthma, depression, back pain, arthritis, anxiety, ADD, HTN, obesity.  I  reviewed Margaret Golden notes, patient reports chronic migraines, can have up to 10-14 headaches per month, she has been using over-the-counter Excedrin Migraine 250 mg 2 tablets as needed, she also has depression with anxiety, fatigue and low energy it may be related to obesity depression or many other causes including migraine, behavior modification was discussed and she is currently in the action stage of change for weight loss.    I was able to review neurologist Margaret Golden notes he saw patient in 2019, patient reported 7 out of 10  intensity, 3-4 headache days a month which 1 to 2 days are migraines, no NSAIDs, she tried Excedrin Migraine in the past, she also tried sertraline and Topamax, lamotrigine and Horizant in the past as well, rare alcohol no smoker, depression controlled, migraine started as a teenager and became more frequent at age 85, bandlike distribution, pressure stabbing, no aura no prodrome no post room, associated symptoms include photophobia, phonophobia, nausea, no vomiting or visual disturbance often, not waking her up from sleep, can last up to 3 days with a total of 7 headache days a month initially and improved to 3-4 headache days a month at follow-up appointment.  Neurologic exam was normal.  Topiramate was continued at that time.   Migraines have gotten worse, she has been managing with excedrin and aspirin, was taking once a week but now worsening for >3 months and worsenined with anemia when she was hospitalized and migraines worsening. She is waking up with headaches, a few years ago she had a sleep test and it was negative, tired during the day, very fatigued, daily headaches in the morning and feels like a band around her head, migraines are pulsating/pounding/throbbing, photophobia/phonophobia, nausea, no vomiting, it hurts to move, she reports "slight double vision", laying down and sleeping helps, she is getting 14 migraine days a month, suffering all are moderate to severe, no medication overuse, no aura, can last 24-48 hours untreated if she catches them soon they can last a few hours. She had one that last 1.5 weeks in the past. She has associated vertigo/dizziness but no weakness or sensory changes. She drinks caffeine is trying to cut back but she has a caffeine "addiction" and we discussed rebound headache.    From a thorough review of records: Medication tried that can be used in migraine management include ibuprofen, Aleve, Tylenol, Fioricet, sumatriptan, Zofran, propranolol, Topamax, bupropion,  Excedrin Migraine, lamotrigine, Horizant, metoprolol.   Reviewed notes, labs and imaging from outside physicians, which showed:   Ferritin 6, TSH 18.3, CMP unremarkable    REVIEW OF SYSTEMS: Out of a complete 14 system review of symptoms, the patient complains only of the following symptoms, and all other reviewed systems are negative.  FSS 61 ESS 6  ALLERGIES: Allergies  Allergen Reactions   Augmentin [Amoxicillin-Pot Clavulanate] Hives    HOME MEDICATIONS: Outpatient Medications Prior to Visit  Medication Sig Dispense Refill   aspirin-acetaminophen-caffeine (EXCEDRIN MIGRAINE) 250-250-65 MG tablet Take 2 tablets by mouth daily as needed for headache or migraine.     furosemide (LASIX) 20 MG tablet TAKE 1 TABLET BY MOUTH TWO TIMES A WEEK 26 tablet 5   gabapentin (NEURONTIN) 300 MG capsule Take 1 capsule (300 mg total) by mouth at bedtime. 90 capsule 0   Iron-FA-B Cmp-C-Biot-Probiotic (FUSION PLUS) CAPS One capsule twice daily with orange juice. Stop your Ferrous sulfate. (Patient taking differently: Take 1 capsule by mouth 2 (two) times daily. with orange juice. Stop your Ferrous sulfate.) 30 capsule  1   lamoTRIgine (LAMICTAL) 200 MG tablet Take 1 tablet (200 mg total) by mouth daily. 90 tablet 0   levonorgestrel (MIRENA, 52 MG,) 20 MCG/DAY IUD 1 each by Intrauterine route once.     levothyroxine (SYNTHROID) 100 MCG tablet Take 1 tablet (100 mcg total) by mouth daily. 90 tablet 0   levothyroxine (SYNTHROID) 137 MCG tablet Take 1 tablet (137 mcg total) by mouth daily before breakfast. 90 tablet 0   LORazepam (ATIVAN) 0.5 MG tablet Take 1 tablet (0.5 mg total) by mouth every 8 (eight) hours as needed for anxiety. 30 tablet 1   lurasidone (LATUDA) 40 MG TABS tablet Take 1 tablet (40 mg total) by mouth daily with supper. 90 tablet 0   methocarbamol (ROBAXIN) 500 MG tablet Take 1 tablet (500 mg total) by mouth every 8 (eight) hours as needed for muscle spasms. 20 tablet 0   methotrexate  (RHEUMATREX) 2.5 MG tablet Take 6 tablets (15 mg total) by mouth once a week. Caution:Chemotherapy. Protect from light. 24 tablet 6   methylPREDNISolone (MEDROL DOSEPAK) 4 MG TBPK tablet Take per package instructions. 21 tablet 0   Multiple Vitamins-Minerals (MULTIVITAMIN WITH MINERALS) tablet Take 1 tablet by mouth daily.     nystatin cream (MYCOSTATIN) Apply 1 application topically 2 (two) times daily. Apply to affected area BID for up to 7 days. 30 g 1   omeprazole (PRILOSEC) 20 MG capsule TAKE TWO CAPSULES BY MOUTH DAILY 60 capsule 0   potassium chloride SA (KLOR-CON) 20 MEQ tablet Take 1 tablet (20 mEq total) by mouth 2 (two) times a week. 26 tablet 0   propranolol (INDERAL) 20 MG tablet Take 1 tablet (20 mg total) by mouth 3 (three) times daily. 270 tablet 3   sertraline (ZOLOFT) 100 MG tablet Take 2 tablets (200 mg total) by mouth daily. 180 tablet 1   topiramate (TOPAMAX) 50 MG tablet Start with 50mg (1 tab) at bedtime. In 1 week increase to 100mg (2 tabs) at bedtime. (Patient taking differently: Take 100 mg by mouth at bedtime.) 180 tablet 6   Vitamin D, Ergocalciferol, (DRISDOL) 1.25 MG (50000 UNIT) CAPS capsule Take 1 capsule (50,000 Units total) by mouth 2 (two) times a week. 10 capsule 0   fluticasone furoate-vilanterol (BREO ELLIPTA) 100-25 MCG/INH AEPB Inhale 1 puff into the lungs daily. (Patient not taking: Reported on 04/27/2021) 28 each 0   No facility-administered medications prior to visit.    PAST MEDICAL Margaret: Past Medical Margaret:  Diagnosis Date   Acid reflux    ADD (attention deficit disorder)    Anemia    Anxiety    Arthritis    left ankle   Back pain    Chest pain    Complication of anesthesia    Depression    Dyspnea    with exertion   Exercise-induced asthma    as a child   Fatty liver    Margaret of blood transfusion    Margaret of chicken pox    Margaret of kidney stones    passed    Hypothyroidism    Joint pain    Lower extremity edema    Major  depressive disorder    Migraines    chronic   Palpitations    Pneumonia    x 2   PONV (postoperative nausea and vomiting)    nausea   RLS (restless legs syndrome)    Sleep apnea 2021   Thyroid disease    hypothyroid    PAST SURGICAL Margaret:  Past Surgical Margaret:  Procedure Laterality Date   BRONCHIAL NEEDLE ASPIRATION BIOPSY  03/31/2020   Procedure: BRONCHIAL NEEDLE ASPIRATION BIOPSIES;  Surgeon: Garner Nash, DO;  Location: [facility];  Service: Pulmonary;;   BRONCHIAL WASHINGS  03/31/2020   Procedure: BRONCHIAL WASHINGS;  Surgeon: Garner Nash, DO;  Location: [facility];  Service: Pulmonary;;   DILATATION & CURETTAGE/HYSTEROSCOPY WITH MYOSURE N/A 03/22/2020   Procedure: [place];  Surgeon: Salvadore Dom, MD;  Location: [facility];  Service: Gynecology;  Laterality: N/A;   INTERCOSTAL NERVE BLOCK Right 06/27/2020   Procedure: INTERCOSTAL NERVE BLOCK;  Surgeon: Lajuana Matte, MD;  Location: [facility];  Service: Thoracic;  Laterality: Right;   INTRAUTERINE DEVICE (IUD) INSERTION N/A 03/22/2020   Procedure: INTRAUTERINE DEVICE (IUD) INSERTION;  Surgeon: Salvadore Dom, MD;  Location: [facility];  Service: Gynecology;  Laterality: N/A;  Mirena IUD insertion   KNEE ARTHROSCOPY Right 2007   LYMPH NODE BIOPSY Right 06/27/2020   Procedure: LYMPH NODE BIOPSY;  Surgeon: Lajuana Matte, MD;  Location: [facility];  Service: Thoracic;  Laterality: Right;   TONSILLECTOMY  2010   TONSILLECTOMY     VIDEO BRONCHOSCOPY WITH ENDOBRONCHIAL ULTRASOUND N/A 03/31/2020   Procedure: VIDEO BRONCHOSCOPY WITH ENDOBRONCHIAL ULTRASOUND;  Surgeon: Garner Nash, DO;  Location: [facility];  Service: Pulmonary;  Laterality: N/A;   WISDOM TOOTH EXTRACTION     WISDOM TOOTH EXTRACTION Bilateral    upper and lower    FAMILY Margaret: Family Margaret  Problem Relation Age of Onset   Depression Mother    Obesity Mother        died from "an  infection" ? MRSA   Depression Father    Sleep apnea Father    Obesity Father    Arthritis Maternal Grandmother    Breast cancer Maternal Grandmother        in remission   Diabetes Maternal Grandfather    Arthritis Maternal Grandfather    Arthritis Paternal Grandmother    Arthritis Paternal Grandfather    Cancer Paternal Aunt        colon   Cancer Paternal Uncle        brain cancer   Migraines Neg Hx     SOCIAL Margaret: Social Margaret   Socioeconomic Margaret   Marital status: Single    Spouse name: Not on file   Number of children: 0   Years of education: 12   Highest education level: Not on file  Occupational Margaret   Occupation: IT sales professional  Tobacco Use   Smoking status: Never    Passive exposure: Never   Smokeless tobacco: Never  Vaping Use   Vaping Use: Never used  Substance and Sexual Activity   Alcohol use: Not Currently    Comment: rare social drink maybe once "every 5 blue moons"   Drug use: No   Sexual activity: Never    Comment: never sexually active  Other Topics Concern   Not on file  Social Margaret Narrative   Unemployed   Lives alone --update 11/11/19   Seeking work   Only child    Lives in a one story house with father, has a cat-12/26/16-sjb      Caffeine: 2-4 cups/day   Social Determinants of Health   Financial Resource Strain: Not on file  Food Insecurity: Not on file  Transportation Needs: Not on file  Physical Activity: Not on file  Stress: Not on file  Social Connections: Not on file  Intimate Partner Violence: Not on file      PHYSICAL EXAM  Vitals:   04/27/21 1432  BP: 126/88  Pulse: 87  Weight: (!) 388 lb (176 kg)  Height: 5\' 7"  (1.702 m)   Body mass index is 60.77 kg/m.  Generalized: Well developed, in no acute distress   Neurological examination  Mentation: Alert oriented to time, place, Margaret taking. Follows all commands speech and language fluent Cranial nerve II-XII: Pupils were equal round  reactive to light. Extraocular movements were full, visual field were full on confrontational test. Facial sensation and strength were normal. Uvula tongue midline. Head turning and shoulder shrug  were normal and symmetric. Motor: The motor testing reveals 5 over 5 strength of all 4 extremities. Good symmetric motor tone is noted throughout.  Sensory: Sensory testing is intact to soft touch on all 4 extremities. No evidence of extinction is noted.  Coordination: Cerebellar testing reveals good finger-nose-finger and heel-to-shin bilaterally.  Gait and station: Gait is normal.  Reflexes: Deep tendon reflexes are symmetric and normal bilaterally.   DIAGNOSTIC DATA (LABS, IMAGING, TESTING) - I reviewed patient records, labs, notes, testing and imaging myself where available.  Lab Results  Component Value Date   WBC 9.7 02/14/2021   HGB 14.0 02/14/2021   HCT 42.0 02/14/2021   MCV 89.9 02/14/2021   PLT 150 02/14/2021      Component Value Date/Time   NA 138 11/02/2020 1437   K 4.0 11/02/2020 1437   CL 104 11/02/2020 1437   CO2 23 11/02/2020 1437   GLUCOSE 90 11/02/2020 1437   BUN 8 11/02/2020 1437   CREATININE 0.78 11/02/2020 1437   CREATININE 0.74 09/30/2019 1047   CREATININE 0.75 07/06/2016 1534   CALCIUM 8.7 11/02/2020 1437   PROT 7.3 11/02/2020 1437   ALBUMIN 4.3 11/02/2020 1437   AST 29 11/02/2020 1437   AST 27 09/30/2019 1047   ALT 20 11/02/2020 1437   ALT 14 09/30/2019 1047   ALKPHOS 85 11/02/2020 1437   BILITOT 0.7 11/02/2020 1437   BILITOT 0.4 09/30/2019 1047   GFRNONAA >60 06/28/2020 0703   GFRNONAA >60 09/30/2019 1047   GFRAA >60 09/30/2019 1047   Lab Results  Component Value Date   CHOL 150 09/03/2019   HDL 39 (L) 09/03/2019   LDLCALC 89 09/03/2019   TRIG 120 09/03/2019   CHOLHDL 4 07/21/2019   Lab Results  Component Value Date   HGBA1C 4.9 09/03/2019   Lab Results  Component Value Date   VITAMINB12 473 09/03/2019   Lab Results  Component Value Date    TSH 1.22 12/26/2020      ASSESSMENT AND PLAN 31 y.o. year old female  has a past medical Margaret of Acid reflux, ADD (attention deficit disorder), Anemia, Anxiety, Arthritis, Back pain, Chest pain, Complication of anesthesia, Depression, Dyspnea, Exercise-induced asthma, Fatty liver, Margaret of blood transfusion, Margaret of chicken pox, Margaret of kidney stones, Hypothyroidism, Joint pain, Lower extremity edema, Major depressive disorder, Migraines, Palpitations, Pneumonia, PONV (postoperative nausea and vomiting), RLS (restless legs syndrome), Sleep apnea (2021), and Thyroid disease. here with:  1.  Migraine headaches  -Check Topamax prescription at home.  Okay to increase Topamax to 50 mg in the morning and 100 mg in the evening for 1 to 2 weeks and if tolerating can increase to 100 mg twice a day thereafter  2.  Morning headaches 3.  Obstructive sleep apnea on CPAP   Explained in great detail the benefits of using CPAP and the  risk associated with untreated sleep apnea.  The patient was advised that her daily headaches could be a result of untreated sleep apnea. Also advised that it does take time for her to get adjusted to the CPAP. We discussed weight loss as an alternative way to treat her sleep apnea if she refuses to use a CPAP. Also advised that there are other DME companies we could use.  Patient states that she will reconsider CPAP and let us know her decision.   Follow-up in 6 months or sooner if needed  Ward Givens, MSN, NP-C 04/27/2021, 2:55 PM [facility] [address], [place], [place] [address] [phone]

## 2021-05-01 DIAGNOSIS — F4323 Adjustment disorder with mixed anxiety and depressed mood: Secondary | ICD-10-CM | POA: Diagnosis not present

## 2021-05-02 ENCOUNTER — Encounter: Payer: Self-pay | Admitting: Psychiatry

## 2021-05-04 ENCOUNTER — Ambulatory Visit: Payer: BC Managed Care – PPO | Admitting: Psychiatry

## 2021-05-08 DIAGNOSIS — F4323 Adjustment disorder with mixed anxiety and depressed mood: Secondary | ICD-10-CM | POA: Diagnosis not present

## 2021-05-23 DIAGNOSIS — F4323 Adjustment disorder with mixed anxiety and depressed mood: Secondary | ICD-10-CM | POA: Diagnosis not present

## 2021-05-24 ENCOUNTER — Ambulatory Visit (INDEPENDENT_AMBULATORY_CARE_PROVIDER_SITE_OTHER): Payer: BC Managed Care – PPO | Admitting: Family

## 2021-05-24 VITALS — BP 137/80 | HR 98 | Temp 98.9°F | Resp 16 | Wt 382.0 lb

## 2021-05-24 DIAGNOSIS — D649 Anemia, unspecified: Secondary | ICD-10-CM | POA: Diagnosis not present

## 2021-05-24 DIAGNOSIS — G2581 Restless legs syndrome: Secondary | ICD-10-CM

## 2021-05-24 DIAGNOSIS — N920 Excessive and frequent menstruation with regular cycle: Secondary | ICD-10-CM

## 2021-05-24 DIAGNOSIS — K219 Gastro-esophageal reflux disease without esophagitis: Secondary | ICD-10-CM

## 2021-05-24 DIAGNOSIS — E039 Hypothyroidism, unspecified: Secondary | ICD-10-CM

## 2021-05-24 DIAGNOSIS — E079 Disorder of thyroid, unspecified: Secondary | ICD-10-CM

## 2021-05-24 DIAGNOSIS — G4730 Sleep apnea, unspecified: Secondary | ICD-10-CM

## 2021-05-24 DIAGNOSIS — D869 Sarcoidosis, unspecified: Secondary | ICD-10-CM

## 2021-05-24 DIAGNOSIS — F32A Depression, unspecified: Secondary | ICD-10-CM

## 2021-05-24 LAB — TSH: TSH: 1.96 u[IU]/mL (ref 0.35–5.50)

## 2021-05-24 LAB — SEDIMENTATION RATE: Sed Rate: 31 mm/hr — ABNORMAL HIGH (ref 0–20)

## 2021-05-24 NOTE — Assessment & Plan Note (Signed)
Reports that this has been overall stable. Continue current dose of soloft.

## 2021-05-24 NOTE — Assessment & Plan Note (Signed)
>>  ASSESSMENT AND PLAN FOR DEPRESSION WRITTEN ON 05/24/2021 11:09 AM BY O'SULLIVAN, Bettie Capistran, NP  Reports that this has been overall stable. Continue current dose of soloft.

## 2021-05-24 NOTE — Assessment & Plan Note (Signed)
Lab Results  Component Value Date   WBC 9.7 02/14/2021   HGB 14.0 02/14/2021   HCT 42.0 02/14/2021   MCV 89.9 02/14/2021   PLT 150 02/14/2021   Last CBC wnl.  She is following with hematology and receiving iron infusion.

## 2021-05-24 NOTE — Assessment & Plan Note (Signed)
Stable.  Continue omeprazole.

## 2021-05-24 NOTE — Progress Notes (Signed)
Subjective:   By signing my name below, I, Margaret Golden, attest that this documentation has been prepared under the direction and in the presence of Margaret Craze, NP 05/24/2021    Patient ID: Margaret Golden, female    DOB: [birth year], 31 y.o.   MRN: [id number]  Chief Complaint  Patient presents with   Hypothyroidism    Here for follow up    HPI Patient is in today for a office visit.   Sarcoidosis- She continues having SOB, chest tightness, painfully breathing due to sarcoidosis. She regularly seeing her pulmonologist to manage her symptoms. She is planning on scheduling an upcomming appointment with them after this visit. She has not seen a rheumatology specialist.  Weight- She has lost weight since her last visit. She has limited the amount of sodas she drinks and switched from caffeine to decaf drinks.  Wt Readings from Last 3 Encounters:  05/24/21 (!) 382 lb (173.3 kg)  04/27/21 (!) 388 lb (176 kg)  02/14/21 (!) 385 lb 12.8 oz (175 kg)   Thyroid- She is requesting a refill for 137 mcg synthroid daily PO and 100 mcg synthroid daily PO.  Reflux- She has no flare ups of reflux while taking 20 omeprazole 2x daily PO. Her symptoms worsen when she eats right before sleeping.  Mood- Her mood has improve while taking 200 mg Zoloft daily PO and reports doing well while taking it. She has flare ups of worsening moods during specific dates.  Hematology- She continues seeing a hematologist for iron deficiency. Her last infusion was last year and she has an upcomming appointment with them this month. She does not have menstrual cycles at this time due to having an IUD placed. She notes after it was placed she had late menstrual cycle which later developed into occasional spotting only.  CPAP- She is not on a CPAP machine at this time. She is planning on reaching out to her neurologist to start it again.  Restless leg- She continues having occasional bouts of restless leg. She  attributes the onset to her anxiety. She finds her symptoms improve if she treats her anxiety.  Vision- She reports no new issues during her last vision care appointment.  Kidney stones- She has no recent kidney stones.    Health Maintenance Due  Topic Date Due   COVID-19 Vaccine (3 - Pfizer risk series) 08/17/2019   INFLUENZA VACCINE  10/31/2020    Past Medical History:  Diagnosis Date   Acid reflux    ADD (attention deficit disorder)    Anemia    Anxiety    Arthritis    left ankle   Back pain    Chest pain    Complication of anesthesia    Depression    Dyspnea    with exertion   Exercise-induced asthma    as a child   Fatty liver    History of blood transfusion    History of chicken pox    History of kidney stones    passed    Hypothyroidism    Joint pain    Lower extremity edema    Major depressive disorder    Migraines    chronic   Palpitations    Pneumonia    x 2   PONV (postoperative nausea and vomiting)    nausea   RLS (restless legs syndrome)    Sleep apnea 2021   Thyroid disease    hypothyroid    Past Surgical History:  Procedure Laterality  Date   BRONCHIAL NEEDLE ASPIRATION BIOPSY  03/31/2020   Procedure: BRONCHIAL NEEDLE ASPIRATION BIOPSIES;  Surgeon: Josephine Igo, DO;  Location: [facility];  Service: Pulmonary;;   BRONCHIAL WASHINGS  03/31/2020   Procedure: BRONCHIAL WASHINGS;  Surgeon: Josephine Igo, DO;  Location: [facility];  Service: Pulmonary;;   DILATATION & CURETTAGE/HYSTEROSCOPY WITH MYOSURE N/A 03/22/2020   Procedure: DILATATION & CURETTAGE/HYSTEROSCOPY WITH MYOSURE;  Surgeon: Romualdo Bolk, MD;  Location: [facility] OR;  Service: Gynecology;  Laterality: N/A;   INTERCOSTAL NERVE BLOCK Right 06/27/2020   Procedure: INTERCOSTAL NERVE BLOCK;  Surgeon: Corliss Skains, MD;  Location: [facility] OR;  Service: Thoracic;  Laterality: Right;   INTRAUTERINE DEVICE (IUD) INSERTION N/A 03/22/2020   Procedure: INTRAUTERINE DEVICE (IUD)  INSERTION;  Surgeon: Romualdo Bolk, MD;  Location: [facility] OR;  Service: Gynecology;  Laterality: N/A;  Mirena IUD insertion   KNEE ARTHROSCOPY Right 2007   LYMPH NODE BIOPSY Right 06/27/2020   Procedure: LYMPH NODE BIOPSY;  Surgeon: Corliss Skains, MD;  Location: [facility] OR;  Service: Thoracic;  Laterality: Right;   TONSILLECTOMY  2010   TONSILLECTOMY     VIDEO BRONCHOSCOPY WITH ENDOBRONCHIAL ULTRASOUND N/A 03/31/2020   Procedure: VIDEO BRONCHOSCOPY WITH ENDOBRONCHIAL ULTRASOUND;  Surgeon: Josephine Igo, DO;  Location: [facility];  Service: Pulmonary;  Laterality: N/A;   WISDOM TOOTH EXTRACTION     WISDOM TOOTH EXTRACTION Bilateral    upper and lower    Family History  Problem Relation Age of Onset   Depression Mother    Obesity Mother        died from "an infection" ? MRSA   Depression Father    Sleep apnea Father    Obesity Father    Arthritis Maternal Grandmother    Breast cancer Maternal Grandmother        in remission   Diabetes Maternal Grandfather    Arthritis Maternal Grandfather    Arthritis Paternal Grandmother    Arthritis Paternal Grandfather    Cancer Paternal Aunt        colon   Cancer Paternal Uncle        brain cancer   Migraines Neg Hx     Social History   Socioeconomic History   Marital status: Single    Spouse name: Not on file   Number of children: 0   Years of education: 12   Highest education level: Not on file  Occupational History   Occupation: Conservation officer, historic buildings  Tobacco Use   Smoking status: Never    Passive exposure: Never   Smokeless tobacco: Never  Vaping Use   Vaping Use: Never used  Substance and Sexual Activity   Alcohol use: Not Currently    Comment: rare social drink maybe once "every 5 blue moons"   Drug use: No   Sexual activity: Never    Comment: never sexually active  Other Topics Concern   Not on file  Social History Narrative   Unemployed   Lives alone --update 11/11/19   Seeking work   Only child     Lives in a one story house with father, has a cat-12/26/16-sjb      Caffeine: 2-4 cups/day   Social Determinants of Corporate investment banker Strain: Not on file  Food Insecurity: Not on file  Transportation Needs: Not on file  Physical Activity: Not on file  Stress: Not on file  Social Connections: Not on file  Intimate Partner Violence: Not on file  Outpatient Medications Prior to Visit  Medication Sig Dispense Refill   aspirin-acetaminophen-caffeine (EXCEDRIN MIGRAINE) 250-250-65 MG tablet Take 2 tablets by mouth daily as needed for headache or migraine.     fluticasone furoate-vilanterol (BREO ELLIPTA) 100-25 MCG/INH AEPB Inhale 1 puff into the lungs daily. 28 each 0   furosemide (LASIX) 20 MG tablet TAKE 1 TABLET BY MOUTH TWO TIMES A WEEK 26 tablet 5   gabapentin (NEURONTIN) 300 MG capsule Take 1 capsule (300 mg total) by mouth at bedtime. 90 capsule 0   Iron-FA-B Cmp-C-Biot-Probiotic (FUSION PLUS) CAPS One capsule twice daily with orange juice. Stop your Ferrous sulfate. (Patient taking differently: Take 1 capsule by mouth 2 (two) times daily. with orange juice. Stop your Ferrous sulfate.) 30 capsule 1   lamoTRIgine (LAMICTAL) 200 MG tablet Take 1 tablet (200 mg total) by mouth daily. 90 tablet 0   levonorgestrel (MIRENA, 52 MG,) 20 MCG/DAY IUD 1 each by Intrauterine route once.     LORazepam (ATIVAN) 0.5 MG tablet Take 1 tablet (0.5 mg total) by mouth every 8 (eight) hours as needed for anxiety. 30 tablet 1   lurasidone (LATUDA) 40 MG TABS tablet Take 1 tablet (40 mg total) by mouth daily with supper. 90 tablet 0   methocarbamol (ROBAXIN) 500 MG tablet Take 1 tablet (500 mg total) by mouth every 8 (eight) hours as needed for muscle spasms. 20 tablet 0   methotrexate (RHEUMATREX) 2.5 MG tablet Take 6 tablets (15 mg total) by mouth once a week. Caution:Chemotherapy. Protect from light. 24 tablet 6   Multiple Vitamins-Minerals (MULTIVITAMIN WITH MINERALS) tablet Take 1 tablet by  mouth daily.     nystatin cream (MYCOSTATIN) Apply 1 application topically 2 (two) times daily. Apply to affected area BID for up to 7 days. 30 g 1   omeprazole (PRILOSEC) 20 MG capsule TAKE TWO CAPSULES BY MOUTH DAILY 60 capsule 0   potassium chloride SA (KLOR-CON) 20 MEQ tablet Take 1 tablet (20 mEq total) by mouth 2 (two) times a week. 26 tablet 0   propranolol (INDERAL) 20 MG tablet Take 1 tablet (20 mg total) by mouth 3 (three) times daily. 270 tablet 3   sertraline (ZOLOFT) 100 MG tablet Take 2 tablets (200 mg total) by mouth daily. 180 tablet 1   topiramate (TOPAMAX) 50 MG tablet Start with 50mg (1 tab) at bedtime. In 1 week increase to 100mg (2 tabs) at bedtime. (Patient taking differently: Take 100 mg by mouth at bedtime.) 180 tablet 6   Vitamin D, Ergocalciferol, (DRISDOL) 1.25 MG (50000 UNIT) CAPS capsule Take 1 capsule (50,000 Units total) by mouth 2 (two) times a week. 10 capsule 0   levothyroxine (SYNTHROID) 100 MCG tablet Take 1 tablet (100 mcg total) by mouth daily. 90 tablet 0   levothyroxine (SYNTHROID) 137 MCG tablet Take 1 tablet (137 mcg total) by mouth daily before breakfast. 90 tablet 0   methylPREDNISolone (MEDROL DOSEPAK) 4 MG TBPK tablet Take per package instructions. 21 tablet 0   No facility-administered medications prior to visit.    Allergies  Allergen Reactions   Augmentin [Amoxicillin-Pot Clavulanate] Hives    ROS See HPI    Objective:    Physical Exam Constitutional:      General: She is not in acute distress.    Appearance: Normal appearance. She is not ill-appearing.  HENT:     Head: Normocephalic and atraumatic.     Right Ear: External ear normal.     Left Ear: External ear normal.  Eyes:  Extraocular Movements: Extraocular movements intact.     Pupils: Pupils are equal, round, and reactive to light.  Cardiovascular:     Rate and Rhythm: Normal rate and regular rhythm.     Heart sounds: Normal heart sounds. No murmur heard.   No gallop.   Pulmonary:     Effort: Pulmonary effort is normal. No respiratory distress.     Breath sounds: Normal breath sounds. No wheezing or rales.  Skin:    General: Skin is warm and dry.  Neurological:     Mental Status: She is alert and oriented to person, place, and time.  Psychiatric:        Behavior: Behavior normal.    BP 137/80 (BP Location: Right Arm, Patient Position: Sitting, Cuff Size: Large)    Pulse 98    Temp 98.9 F (37.2 C) (Oral)    Resp 16    Wt (!) 382 lb (173.3 kg)    SpO2 99%    BMI 59.83 kg/m  Wt Readings from Last 3 Encounters:  05/24/21 (!) 382 lb (173.3 kg)  04/27/21 (!) 388 lb (176 kg)  02/14/21 (!) 385 lb 12.8 oz (175 kg)       Assessment & Plan:   Problem List Items Addressed This Visit       Unprioritized   Symptomatic anemia    Lab Results  Component Value Date   WBC 9.7 02/14/2021   HGB 14.0 02/14/2021   HCT 42.0 02/14/2021   MCV 89.9 02/14/2021   PLT 150 02/14/2021  Last CBC wnl.  She is following with hematology and receiving iron infusion.       Sleep apnea    She is not currently not using cpap. Has follow up with neurology to restart.       Sarcoidosis - Primary    She continues to be symptomatic.  Recommended that she schedule follow up with her pulmonologist.       Relevant Orders   ANA   Rheumatoid Factor (Completed)   Sedimentation rate (Completed)   RLS (restless legs syndrome)    Overall stable. Monitor.      Menorrhagia with regular cycle    Resolved following placement of IUD.      Hypothyroidism    Clinically stable on synthroid 237 mcg daily. Obtain follow up TSH.       Relevant Orders   TSH (Completed)   GERD (gastroesophageal reflux disease)    Stable. Continue omeprazole.       Depression    Reports that this has been overall stable. Continue current dose of soloft.         No orders of the defined types were placed in this encounter.   Desma McgregorI, O'Sullivan, Yafet Cline, NP, personally preformed the services  described in this documentation.  All medical record entries made by the scribe were at my direction and in my presence.  I have reviewed the chart and discharge instructions (if applicable) and agree that the record reflects my personal performance and is accurate and complete. 05/24/2021   I,Margaret Golden,acting as a scribe for Lemont FillersMelissa S O'Sullivan, NP.,have documented all relevant documentation on the behalf of Lemont FillersMelissa S O'Sullivan, NP,as directed by  Lemont FillersMelissa S O'Sullivan, NP while in the presence of Lemont FillersMelissa S O'Sullivan, NP.   Lemont FillersMelissa S O'Sullivan, NP

## 2021-05-24 NOTE — Assessment & Plan Note (Signed)
Overall stable. Monitor.

## 2021-05-24 NOTE — Assessment & Plan Note (Signed)
Lab Results  Component Value Date   TSH 1.22 12/26/2020   Clinically stable on synthroid 137 mcg. Will check follow up TSH. Wt Readings from Last 3 Encounters:  05/24/21 (!) 382 lb (173.3 kg)  04/27/21 (!) 388 lb (176 kg)  02/14/21 (!) 385 lb 12.8 oz (175 kg)

## 2021-05-24 NOTE — Patient Instructions (Signed)
Please call to schedule an appointment with Dr. Francine Graven re: sarcoid.

## 2021-05-24 NOTE — Assessment & Plan Note (Signed)
She is not currently not using cpap. Has follow up with neurology to restart.

## 2021-05-25 ENCOUNTER — Other Ambulatory Visit: Payer: Self-pay | Admitting: Family

## 2021-05-25 ENCOUNTER — Other Ambulatory Visit: Payer: Self-pay | Admitting: Family Medicine

## 2021-05-25 DIAGNOSIS — E039 Hypothyroidism, unspecified: Secondary | ICD-10-CM

## 2021-05-25 DIAGNOSIS — D869 Sarcoidosis, unspecified: Secondary | ICD-10-CM | POA: Insufficient documentation

## 2021-05-25 MED ORDER — LEVOTHYROXINE SODIUM 137 MCG PO TABS: 137.0000 ug | ORAL_TABLET | Freq: Every day | ORAL | 1 refills | Status: DC

## 2021-05-25 MED ORDER — LEVOTHYROXINE SODIUM 100 MCG PO TABS: 100.0000 ug | ORAL_TABLET | Freq: Every day | ORAL | 1 refills | Status: DC

## 2021-05-25 NOTE — Assessment & Plan Note (Signed)
She continues to be symptomatic.  Recommended that she schedule follow up with her pulmonologist.

## 2021-05-25 NOTE — Assessment & Plan Note (Signed)
Clinically stable on synthroid 237 mcg daily. Obtain follow up TSH.

## 2021-05-25 NOTE — Assessment & Plan Note (Signed)
Resolved following placement of IUD.

## 2021-05-26 LAB — ANA: Anti Nuclear Antibody (ANA): NEGATIVE

## 2021-05-26 LAB — RHEUMATOID FACTOR: Rheumatoid fact SerPl-aCnc: <14 IU/mL (ref <14)

## 2021-05-26 MED ORDER — OMEPRAZOLE 20 MG PO CPDR: 40.0000 mg | DELAYED_RELEASE_CAPSULE | Freq: Every day | ORAL | 1 refills | Status: DC

## 2021-05-29 DIAGNOSIS — F4323 Adjustment disorder with mixed anxiety and depressed mood: Secondary | ICD-10-CM | POA: Diagnosis not present

## 2021-06-01 ENCOUNTER — Encounter: Payer: Self-pay | Admitting: Psychiatry

## 2021-06-01 ENCOUNTER — Other Ambulatory Visit: Payer: Self-pay

## 2021-06-01 ENCOUNTER — Ambulatory Visit (INDEPENDENT_AMBULATORY_CARE_PROVIDER_SITE_OTHER): Payer: BC Managed Care – PPO | Admitting: Psychiatry

## 2021-06-01 DIAGNOSIS — F4010 Social phobia, unspecified: Secondary | ICD-10-CM | POA: Diagnosis not present

## 2021-06-01 DIAGNOSIS — F339 Major depressive disorder, recurrent, unspecified: Secondary | ICD-10-CM

## 2021-06-01 MED ORDER — SERTRALINE HCL 100 MG PO TABS: ORAL_TABLET | ORAL | 0 refills | Status: DC

## 2021-06-01 MED ORDER — LURASIDONE HCL 40 MG PO TABS: ORAL_TABLET | ORAL | 0 refills | Status: DC

## 2021-06-01 NOTE — Progress Notes (Signed)
Been Margaret Famaylor A Lore [id number] 06/23/1990 30 y.o.  Subjective:   Patient ID:  Margaret Golden is a 31 y.o. (DOB 12/13/1990) female.  Chief Complaint:  Chief Complaint  Patient presents with   Anxiety   Fatigue    HPI Margaret Golden presents to the office today for follow-up of depression, anxiety, and insomnia. She reports that she is "up and down." She reports that she does not recall when she last took her meds "because I have been in such a brain fog." Reports that it has been over a month since she has taken meds consistently. She reports that she has been having difficulty keeping track of appointments and when she needs to do it. She reports low motivation and has been easily distracted. "I haven't really felt depressed or sad, it's more low energy." She reports sleeping excessively at times. Appetite is either "one of the 2 extremes... it changes almost daily." She has had increased anxiety. Has had worry. She reports that panic s/s are situational and "not out of the blue." She reports poor concentration, even with activities that she enjoys. Diminished enjoyment in things. Denies SI.   She reports that Methotrexate is the only medication that she has been taking consistently and that this has been causing severe fatigue.   Reports that February is a difficult month for her due to mother's death.   Continues to see therapist regularly.   Past medication trials: Wellbutrin- initially helpful and then was less helpful Lexapro-ineffective Cymbalta Pristiq Seroquel- does not recall any significant improvement Latuda Abilify Propranolol- prescribed for HR. Not helpful for anxiety Melatonin Vyvanse Adderall XR- not effective Concerta- Notices duration only lasts about 4 hours. No significant improvement with doses less than 54 mg.  Marnee Springdhansia- May have caused n/v and poor concentration Horizant Lamictal-effective for mood Trazodone-effective for insomnia but causes some  excessive drowsiness Rexulti- May have caused n/v and poor concentration Ativan- helpful Armodafinil- jaw locked  GAD-7    Flowsheet Row [facility]  Total GAD-7 Score 19      PHQ2-9    Flowsheet Row [facility] [place] [facility] Nutrition from 02/10/2019 [facility] [place] [facility]  PHQ-2 Total Score 6 3 2 4 3   PHQ-9 Total Score 20 14 -- 15 16      Flowsheet Row [facility]Dis[facility] Pre-Admission Testing 60 [facility] [place] US CORE BIOPSY LYM NOD [facility]  C-SSRS RISK CATEGORY No Risk No Risk No Risk        Review of Systems:  Review of Systems  Constitutional:  Positive for fatigue.  Respiratory:  Positive for chest tightness and shortness of breath.   Musculoskeletal:  Negative for gait problem.       Ankle pain  Psychiatric/Behavioral:         Please refer to HPI   Medications: I have reviewed the patient's current medications.  Current Outpatient Medications  Medication Sig Dispense Refill   methotrexate (RHEUMATREX) 2.5 MG tablet Take 6 tablets (15 mg total) by mouth once a week.  Caution:Chemotherapy. Protect from light. 24 tablet 6   aspirin-acetaminophen-caffeine (EXCEDRIN MIGRAINE) 250-250-65 MG tablet Take 2 tablets by mouth daily as needed for headache or migraine.     fluticasone furoate-vilanterol (BREO ELLIPTA) 100-25 MCG/INH AEPB Inhale 1 puff into the lungs daily. 28 each 0   furosemide (LASIX) 20 MG tablet TAKE 1 TABLET BY MOUTH  TWO TIMES A WEEK 26 tablet 5   gabapentin (NEURONTIN) 300 MG capsule Take 1 capsule (300 mg total) by mouth at bedtime. 90 capsule 0   Iron-FA-B Cmp-C-Biot-Probiotic (FUSION PLUS) CAPS One capsule twice daily with orange juice. Stop your Ferrous sulfate. (Patient taking differently: Take 1 capsule by mouth 2 (two) times daily. with orange juice. Stop your Ferrous sulfate.) 30 capsule 1   levonorgestrel (MIRENA, 52 MG,) 20 MCG/DAY IUD 1 each by Intrauterine route once.     levothyroxine (SYNTHROID) 100 MCG tablet Take 1 tablet (100 mcg total) by mouth daily. 90 tablet 1   levothyroxine (SYNTHROID) 137 MCG tablet Take 1 tablet (137 mcg total) by mouth daily before breakfast. 90 tablet 1   LORazepam (ATIVAN) 0.5 MG tablet Take 1 tablet (0.5 mg total) by mouth every 8 (eight) hours as needed for anxiety. 30 tablet 1   lurasidone (LATUDA) 40 MG TABS tablet Take 1/2 tablet daily with supper, then increase to 1 tablet daily with supper 90 tablet 0   methocarbamol (ROBAXIN) 500 MG tablet Take 1 tablet (500 mg total) by mouth every 8 (eight) hours as needed for muscle spasms. 20 tablet 0   Multiple Vitamins-Minerals (MULTIVITAMIN WITH MINERALS) tablet Take 1 tablet by mouth daily.     nystatin cream (MYCOSTATIN) Apply 1 application topically 2 (two) times daily. Apply to affected area BID for up to 7 days. 30 g 1   omeprazole (PRILOSEC) 20 MG capsule Take 2 capsules (40 mg total) by mouth daily. 180 capsule 1   potassium chloride SA (KLOR-CON) 20 MEQ tablet Take 1 tablet (20 mEq total) by mouth 2 (two) times a week. 26 tablet 0   propranolol (INDERAL) 20 MG tablet Take 1 tablet (20 mg total) by mouth 3 (three) times daily. 270 tablet 3   sertraline (ZOLOFT) 100 MG tablet Take 1/2 tablet daily for a week, then increase to 1 tablet daily for one week, then 1.5 tablets daily for one week, then 2 tablets daily 180 tablet 0   topiramate (TOPAMAX) 50 MG tablet Start with 50mg (1 tab) at bedtime. In 1 week increase to  100mg (2 tabs) at bedtime. (Patient taking differently: Take 100 mg by mouth at bedtime.) 180 tablet 6   Vitamin D, Ergocalciferol, (DRISDOL) 1.25 MG (50000 UNIT) CAPS capsule Take 1 capsule (50,000 Units total) by mouth 2 (two) times a week. 10 capsule 0   No current facility-administered medications for this visit.    Medication Side Effects: None  Allergies:  Allergies  Allergen Reactions   Augmentin [Amoxicillin-Pot Clavulanate] Hives    Past Medical History:  Diagnosis Date   Acid reflux    ADD (attention deficit disorder)    Anemia    Anxiety    Arthritis    left ankle   Back pain    Chest pain    Complication of anesthesia    Depression    Dyspnea    with exertion   Exercise-induced asthma    as a child   Fatty liver    History of blood transfusion    History of chicken pox    History of kidney  stones    passed    Hypothyroidism    Joint pain    Lower extremity edema    Major depressive disorder    Migraines    chronic   Palpitations    Pneumonia    x 2   PONV (postoperative nausea and vomiting)    nausea   RLS (restless legs syndrome)    Sleep apnea 2021   Thyroid disease    hypothyroid    Past Medical History, Surgical history, Social history, and Family history were reviewed and updated as appropriate.   Please see review of systems for further details on the patient's review from today.   Objective:   Physical Exam:  There were no vitals taken for this visit.  Physical Exam Constitutional:      General: She is not in acute distress. Musculoskeletal:        General: No deformity.  Neurological:     Mental Status: She is alert and oriented to person, place, and time.     Coordination: Coordination normal.  Psychiatric:        Attention and Perception: Attention and perception normal. She does not perceive auditory or visual hallucinations.        Mood and Affect: Mood is anxious. Affect is not labile, blunt, angry or inappropriate.         Speech: Speech normal.        Behavior: Behavior normal.        Thought Content: Thought content normal. Thought content is not paranoid or delusional. Thought content does not include homicidal or suicidal ideation. Thought content does not include homicidal or suicidal plan.        Cognition and Memory: Cognition and memory normal.        Judgment: Judgment normal.     Comments: Insight intact Dysthymic mood    Lab Review:     Component Value Date/Time   NA 138 11/02/2020 1437   K 4.0 11/02/2020 1437   CL 104 11/02/2020 1437   CO2 23 11/02/2020 1437   GLUCOSE 90 11/02/2020 1437   BUN 8 11/02/2020 1437   CREATININE 0.78 11/02/2020 1437   CREATININE 0.74 09/30/2019 1047   CREATININE 0.75 07/06/2016 1534   CALCIUM 8.7 11/02/2020 1437   PROT 7.3 11/02/2020 1437   ALBUMIN 4.3 11/02/2020 1437   AST 29 11/02/2020 1437   AST 27 09/30/2019 1047   ALT 20 11/02/2020 1437   ALT 14 09/30/2019 1047   ALKPHOS 85 11/02/2020 1437   BILITOT 0.7 11/02/2020 1437   BILITOT 0.4 09/30/2019 1047   GFRNONAA >60 06/28/2020 0703   GFRNONAA >60 09/30/2019 1047   GFRAA >60 09/30/2019 1047       Component Value Date/Time   WBC 9.7 02/14/2021 1349   WBC 7.1 11/02/2020 1437   RBC 4.67 02/14/2021 1349   RBC 4.71 02/14/2021 1349   HGB 14.0 02/14/2021 1349   HGB 9.3 (L) 08/26/2019 1045   HCT 42.0 02/14/2021 1349   HCT 33.4 (L) 08/26/2019 1045   PLT 150 02/14/2021 1349   PLT 234 08/26/2019 1045   MCV 89.9 02/14/2021 1349   MCV 72 (L) 08/26/2019 1045   MCH 30.0 02/14/2021 1349   MCHC 33.3 02/14/2021 1349   RDW 15.0 02/14/2021 1349   RDW 15.4 08/26/2019 1045   LYMPHSABS 2.1 02/14/2021 1349   MONOABS 0.6 02/14/2021 1349   EOSABS 0.2 02/14/2021 1349   BASOSABS 0.0 02/14/2021 1349    No results found for: POCLITH,  LITHIUM   No results found for: PHENYTOIN, PHENOBARB, VALPROATE, CBMZ   .res Assessment: Plan:   Patient seen for 30 minutes and time spent discussing recent lapse in  medications and barriers to taking medications more consistently.  Encouraged patient to talk with therapist to explore possible barriers to medication compliance.  Also discussed considering prepackaged medications since she reports having brain fog and difficulty keeping track with medication.  Discussed gradually restarting medication to reduce pill burden and potentially improve compliance.  Patient is in agreement with this plan.  Discussed initially restarting only Latuda and sertraline. Will restart Latuda 40 mg 1/2 tablet daily with supper for 1 week, then increase to 1 whole tablet daily with supper for depression. Will restart sertraline 50 mg daily for 1 week, then increase to 100 mg daily for 1 week, then increase to 150 mg daily for 1 week, then increase to 200 mg daily for anxiety and depression. Recommend continuing psychotherapy. Patient advised to contact office with any questions, adverse effects, or acute worsening in signs and symptoms. Pt to follow-up in 4 weeks or sooner if clinically indicated.    Connar was seen today for anxiety and fatigue.  Diagnoses and all orders for this visit:  Major depression, recurrent, chronic (HCC) -     lurasidone (LATUDA) 40 MG TABS tablet; Take 1/2 tablet daily with supper, then increase to 1 tablet daily with supper -     sertraline (ZOLOFT) 100 MG tablet; Take 1/2 tablet daily for a week, then increase to 1 tablet daily for one week, then 1.5 tablets daily for one week, then 2 tablets daily  Social phobia -     sertraline (ZOLOFT) 100 MG tablet; Take 1/2 tablet daily for a week, then increase to 1 tablet daily for one week, then 1.5 tablets daily for one week, then 2 tablets daily     Please see After Visit Summary for patient specific instructions.  Future Appointments  Date Time Provider Department Center  06/29/2021  2:00 PM Corie Chiquito, PMHNP [facility] None  08/14/2021  2:00 PM [facility] None  11/02/2021  2:00 PM Butch Penny, NP [facility] None  02/14/2022  1:00 PM Erenest Blank, NP [facility] None    No orders of the defined types were placed in this encounter.   -------------------------------

## 2021-06-12 DIAGNOSIS — F4323 Adjustment disorder with mixed anxiety and depressed mood: Secondary | ICD-10-CM | POA: Diagnosis not present

## 2021-06-19 ENCOUNTER — Other Ambulatory Visit: Payer: Self-pay | Admitting: Pulmonary Disease

## 2021-06-19 ENCOUNTER — Telehealth: Payer: Self-pay | Admitting: Pulmonary Disease

## 2021-06-19 DIAGNOSIS — D869 Sarcoidosis, unspecified: Secondary | ICD-10-CM

## 2021-06-19 DIAGNOSIS — F4323 Adjustment disorder with mixed anxiety and depressed mood: Secondary | ICD-10-CM | POA: Diagnosis not present

## 2021-06-19 NOTE — Telephone Encounter (Signed)
Received an electronic refill for her methotrexate that had been approved earlier. She is aware that the RX has been sent to the pharmacy.  ? ?Nothing further needed at time of call.  ?

## 2021-06-26 DIAGNOSIS — F4323 Adjustment disorder with mixed anxiety and depressed mood: Secondary | ICD-10-CM | POA: Diagnosis not present

## 2021-06-27 ENCOUNTER — Other Ambulatory Visit: Payer: Self-pay

## 2021-06-27 ENCOUNTER — Ambulatory Visit: Payer: BC Managed Care – PPO | Admitting: Pulmonary Disease

## 2021-06-27 ENCOUNTER — Encounter: Payer: Self-pay | Admitting: Pulmonary Disease

## 2021-06-27 VITALS — BP 126/84 | HR 88 | Ht 67.0 in | Wt 381.8 lb

## 2021-06-27 DIAGNOSIS — D869 Sarcoidosis, unspecified: Secondary | ICD-10-CM

## 2021-06-27 NOTE — Progress Notes (Signed)
? ?Synopsis: Referred in 12/2019 by [name], NP for mediastinal adenopathy ? ?Subjective:  ? ?PATIENT ID: Margaret Golden GENDER: female DOB: [birth year], MRN: [id number] ? ?HPI ? ?Chief Complaint  ?Patient presents with  ? Follow-up  ?  F/U on sarcoidosis. States the methotrexate is not working. She feels it stopped 3-4 months ago. Increased brain fog as well.   ? ?Margaret Golden is a 31 year old woman, non-smoker with history of anemia, hypertension and obesity who returns to pulmonary clinic for follow up of sarcoidosis.  ?  ?She continues on 15mg  methotrexate weekly since last fall. She denies any improvement in her chest pains or dyspnea. No updated lab work since 01/2021. She has gained 14lbs since last visit. She complains of feeling sluggish and having brain fog for 2-3 days after taking her weekly dose of methotrexate.  ? ?She is not using CPAP.  ? ?OV 08/2020 ?She was started on methotrexate 5mg  weekly at last visit. She has tolerated the medication well thus far. She does not notice any change in her dyspnea on exertion. She continues to have intermittent chest pains.  ? ?Pulmonary function tests are normal overall but they show increased diffusion capacity.  ? ?OV 07/26/20 ?She developed chest discomfort and shortness of breath about 1 year ago. She was hospitalized for symptomatic anemia due to heavy menses. Her hemoglobin has remained in the normal range since but she continued to have the above symptoms. She was evaluated by Cardiology prior to hysteroscopy who performed an ECHO which was normal and a CTA coronary scan which showed mediastinal adenopathy and pulmonary nodules.  ? ?We have followed her thoracic adenopathy with serial CT chest scans which demonstrated progression of the adenopathy prompting a PET scan on 04/27/20 which demonstrated increased uptake in her lymph nodes and also the right upper lobe nodule. She was taken for EBUS on 03/31/20 with sampling of the station 7 lymph node  with non-diagnostic results. She was then referred to Thoracic Surgery who performed mediastinoscopy on 06/27/20 with lymph node samples  with non-caseating granulomas consistent with sarcoidosis.    ? ?She denies any vision changes, joint pains or skin rashes. She continues to have exertional dyspnea, chest pressure and has started to wake up short of breath at night at times.  ? ?No family members with sarcoidosis.  ? ?Past Medical History:  ?Diagnosis Date  ? Acid reflux   ? ADD (attention deficit disorder)   ? Anemia   ? Anxiety   ? Arthritis   ? left ankle  ? Back pain   ? Chest pain   ? Complication of anesthesia   ? Depression   ? Dyspnea   ? with exertion  ? Exercise-induced asthma   ? as a child  ? Fatty liver   ? History of blood transfusion   ? History of chicken pox   ? History of kidney stones   ? passed   ? Hypothyroidism   ? Joint pain   ? Lower extremity edema   ? Major depressive disorder   ? Migraines   ? chronic  ? Palpitations   ? Pneumonia   ? x 2  ? PONV (postoperative nausea and vomiting)   ? nausea  ? RLS (restless legs syndrome)   ? Sleep apnea 2021  ? Thyroid disease   ? hypothyroid  ?  ? ?Family History  ?Problem Relation Age of Onset  ? Depression Mother   ? Obesity Mother   ?  died from "an infection" ? MRSA  ? Depression Father   ? Sleep apnea Father   ? Obesity Father   ? Arthritis Maternal Grandmother   ? Breast cancer Maternal Grandmother   ?     in remission  ? Diabetes Maternal Grandfather   ? Arthritis Maternal Grandfather   ? Arthritis Paternal Grandmother   ? Arthritis Paternal Grandfather   ? Cancer Paternal Aunt   ?     colon  ? Cancer Paternal Uncle   ?     brain cancer  ? Migraines Neg Hx   ?  ? ?Social History  ? ?Socioeconomic History  ? Marital status: Single  ?  Spouse name: Not on file  ? Number of children: 0  ? Years of education: 14  ? Highest education level: Not on file  ?Occupational History  ? Occupation: Conservation officer, historic buildings  ?Tobacco Use  ? Smoking status:  Never  ?  Passive exposure: Never  ? Smokeless tobacco: Never  ?Vaping Use  ? Vaping Use: Never used  ?Substance and Sexual Activity  ? Alcohol use: Not Currently  ?  Comment: rare social drink maybe once "every 5 blue moons"  ? Drug use: No  ? Sexual activity: Never  ?  Comment: never sexually active  ?Other Topics Concern  ? Not on file  ?Social History Narrative  ? Unemployed  ? Lives alone --update 11/11/19  ? Seeking work  ? Only child   ? Lives in a one story house with father, has a cat-12/26/16-sjb  ?   ? Caffeine: 2-4 cups/day  ? ?Social Determinants of Health  ? ?Financial Resource Strain: Not on file  ?Food Insecurity: Not on file  ?Transportation Needs: Not on file  ?Physical Activity: Not on file  ?Stress: Not on file  ?Social Connections: Not on file  ?Intimate Partner Violence: Not on file  ?  ? ?Allergies  ?Allergen Reactions  ? Augmentin [Amoxicillin-Pot Clavulanate] Hives  ?  ? ?Outpatient Medications Prior to Visit  ?Medication Sig Dispense Refill  ? aspirin-acetaminophen-caffeine (EXCEDRIN MIGRAINE) 250-250-65 MG tablet Take 2 tablets by mouth daily as needed for headache or migraine.    ? furosemide (LASIX) 20 MG tablet TAKE 1 TABLET BY MOUTH TWO TIMES A WEEK 26 tablet 5  ? Iron-FA-B Cmp-C-Biot-Probiotic (FUSION PLUS) CAPS One capsule twice daily with orange juice. Stop your Ferrous sulfate. (Patient taking differently: Take 1 capsule by mouth 2 (two) times daily. with orange juice. Stop your Ferrous sulfate.) 30 capsule 1  ? levonorgestrel (MIRENA, 52 MG,) 20 MCG/DAY IUD 1 each by Intrauterine route once.    ? levothyroxine (SYNTHROID) 100 MCG tablet Take 1 tablet (100 mcg total) by mouth daily. 90 tablet 1  ? levothyroxine (SYNTHROID) 137 MCG tablet Take 1 tablet (137 mcg total) by mouth daily before breakfast. 90 tablet 1  ? LORazepam (ATIVAN) 0.5 MG tablet Take 1 tablet (0.5 mg total) by mouth every 8 (eight) hours as needed for anxiety. 30 tablet 1  ? methocarbamol (ROBAXIN) 500 MG tablet  Take 1 tablet (500 mg total) by mouth every 8 (eight) hours as needed for muscle spasms. 20 tablet 0  ? methotrexate (RHEUMATREX) 2.5 MG tablet TAKE 6 TABLETS BY MOUTH ONCE A WEEK. CAUTION: CHEMOTHERAPY. PROTECT FROM LIGHT 24 tablet 0  ? Multiple Vitamins-Minerals (MULTIVITAMIN WITH MINERALS) tablet Take 1 tablet by mouth daily.    ? nystatin cream (MYCOSTATIN) Apply 1 application topically 2 (two) times daily. Apply to affected area BID  for up to 7 days. 30 g 1  ? omeprazole (PRILOSEC) 20 MG capsule Take 2 capsules (40 mg total) by mouth daily. 180 capsule 1  ? potassium chloride SA (KLOR-CON) 20 MEQ tablet Take 1 tablet (20 mEq total) by mouth 2 (two) times a week. 26 tablet 0  ? propranolol (INDERAL) 20 MG tablet Take 1 tablet (20 mg total) by mouth 3 (three) times daily. 270 tablet 3  ? topiramate (TOPAMAX) 50 MG tablet Start with (1 tab) at bedtime. In 1 week increase to (2 tabs) at bedtime. (Patient taking differently: Take 100 mg by mouth at bedtime.) 180 tablet 6  ? Vitamin D, Ergocalciferol, (DRISDOL) 1.25 MG (50000 UNIT) CAPS capsule Take 1 capsule (50,000 Units total) by mouth 2 (two) times a week. 10 capsule 0  ? lurasidone (LATUDA) 40 MG TABS tablet Take 1/2 tablet daily with supper, then increase to 1 tablet daily with supper 90 tablet 0  ? sertraline (ZOLOFT) 100 MG tablet Take 1/2 tablet daily for a week, then increase to 1 tablet daily for one week, then 1.5 tablets daily for one week, then 2 tablets daily 180 tablet 0  ? fluticasone furoate-vilanterol (BREO ELLIPTA) 100-25 MCG/INH AEPB Inhale 1 puff into the lungs daily. 28 each 0  ? gabapentin (NEURONTIN) 300 MG capsule Take 1 capsule (300 mg total) by mouth at bedtime. 90 capsule 0  ? ?No facility-administered medications prior to visit.  ? ? ?Review of Systems  ?Constitutional:  Positive for malaise/fatigue. Negative for chills, fever and weight loss.  ?HENT:  Negative for congestion and sore throat.   ?Respiratory:  Positive for  shortness of breath. Negative for cough, hemoptysis, sputum production and wheezing.   ?Cardiovascular:  Positive for chest pain. Negative for palpitations, orthopnea, claudication, leg swelling and PND.  ?[place]ast

## 2021-06-27 NOTE — Patient Instructions (Signed)
Continue methotrexate 6 tabs weekly ? ?We will check labs today  ? ?We will talk with pharmacy about splitting the dose like you have suggested to avoid side effects. I will also discuss with them about increasing the dose.  ? ?We may need to add on a low dose prednisone along with the methotrexate based on the CT chest scan.  ? ?Contact your Neurological team about your sleep apnea treatment ? ?Follow up in 4 months.  ?

## 2021-06-29 ENCOUNTER — Ambulatory Visit (INDEPENDENT_AMBULATORY_CARE_PROVIDER_SITE_OTHER): Payer: BC Managed Care – PPO | Admitting: Psychiatry

## 2021-06-29 ENCOUNTER — Encounter: Payer: Self-pay | Admitting: Psychiatry

## 2021-06-29 DIAGNOSIS — F4010 Social phobia, unspecified: Secondary | ICD-10-CM | POA: Diagnosis not present

## 2021-06-29 DIAGNOSIS — G2581 Restless legs syndrome: Secondary | ICD-10-CM | POA: Diagnosis not present

## 2021-06-29 DIAGNOSIS — F339 Major depressive disorder, recurrent, unspecified: Secondary | ICD-10-CM

## 2021-06-29 MED ORDER — LURASIDONE HCL 40 MG PO TABS: 40.0000 mg | ORAL_TABLET | Freq: Every day | ORAL | 0 refills | Status: DC

## 2021-06-29 MED ORDER — SERTRALINE HCL 100 MG PO TABS: 200.0000 mg | ORAL_TABLET | Freq: Every day | ORAL | 0 refills | Status: DC

## 2021-06-29 MED ORDER — GABAPENTIN 300 MG PO CAPS: 300.0000 mg | ORAL_CAPSULE | Freq: Every day | ORAL | 1 refills | Status: DC

## 2021-06-29 NOTE — Progress Notes (Signed)
Margaret Golden ?[id number] ?May 21, 1990 ?31 y.o. ? ?Subjective:  ? ?Patient ID:  Margaret Golden is a 31 y.o. (DOB 1990/10/08) female. ? ?Chief Complaint:  ?Chief Complaint  ?Patient presents with  ? Anxiety  ? Depression  ? ? ?HPI ?Margaret Golden presents to the office today for follow-up of depression, anxiety, and insomnia. She describes her mood as "neutral... pockets of good mood again." Anxiety has been mostly situational. Had anxiety prior to seeing pulmonologist. Denies recent panic attacks. She has been avoiding some social situations due to anxiety. Denies any excessive irritability and reports that this has improved. Chronic low energy. Motivation is low "and some days even when I have the motivation, I don't have the energy" to follow through. She reports difficulty sustaining focus. Difficulty watching some series on TV. Appetite has been "up and down" and this may be related to allergy s/s. She reports looking forward to some things, ie. Friend coming to visit, father visiting over the holidays, etc. Denies passive death wishes or SI.  ? ?Sleep has been good overall. She had difficulty returning to sleep last night after awakening to use the bathroom. She will use Zzzquil prn with good response. She reports that her sleep schedule is delayed and does not want to fall asleep until after midnight and then sleep until 9-11 am.  ? ?She reports "brain fog" and "fatigue" with methotrexate.  ? ?Father is currently working in [place] state.  ? ?Has gotten cats harnesses and plans to try to walk them.  ? ?Continues to see therapist regularly.  ? ?Has not needed Ativan prn recently.  ? ?Past medication trials: ?Wellbutrin- initially helpful and then was less helpful ?Lexapro-ineffective ?Cymbalta ?Pristiq ?Seroquel- does not recall any significant improvement ?Latuda ?Abilify ?Propranolol- prescribed for HR. Not helpful for anxiety ?Melatonin ?Vyvanse ?Adderall XR- not effective ?Concerta- Notices duration  only lasts about 4 hours. No significant improvement with doses less than 54 mg.  ?[name]- May have caused n/v and poor concentration ?Horizant ?Lamictal-effective for mood ?Trazodone-effective for insomnia but causes some excessive drowsiness ?Rexulti- May have caused n/v and poor concentration ?Ativan- helpful ?Armodafinil- jaw locked ?GAD-7   ? ?Flowsheet Row Office Visit from 11/16/2020 in [facility] at [facility]  ?Total GAD-7 Score 19  ? ?  ? ?PHQ2-9   ? ?Flowsheet Row Office Visit from 11/16/2020 in [facility] at [facility] Office Visit from 09/03/2019 in [facility] Nutrition from 02/10/2019 in [facility] Office Visit from 05/02/2018 in [facility] at [facility] Office Visit from 04/19/2017 in [facility] at [facility]  ?PHQ-2 Total Score 6 3 2 4 3   ?PHQ-9 Total Score 20 14 -- 15 16  ? ?  ? ?Flowsheet Row Admission (Discharged) from 06/27/2020 in [facility] 4E CV SURGICAL PROGRESSIVE CARE Pre-Admission Testing 60 from 06/23/2020 in [facility] PREADMISSION TESTING [facility] CORE BIOPSY LYM NOD [facility] & [facility] from 06/09/2020 in [facility] ULTRASOUND  ?C-SSRS RISK CATEGORY No Risk No Risk No Risk  ? ?  ?  ? ?Review of Systems:  ?Review of Systems  ?Respiratory:  Positive for chest tightness and shortness of breath.   ?     Chest pain on exertion  ?Musculoskeletal:  Negative for gait problem.  ?Allergic/Immunologic: Positive for environmental allergies.  ?Psychiatric/Behavioral:    ?     Please refer to HPI  ? ?Medications:  I have reviewed the patient's current medications. ? ?Current Outpatient Medications  ?Medication Sig Dispense Refill  ? aspirin-acetaminophen-caffeine (EXCEDRIN MIGRAINE) 250-250-65 MG tablet Take 2 tablets by mouth daily as needed for headache or migraine.    ? diphenhydrAMINE HCl, Sleep, (ZZZQUIL PO) Take by mouth at  bedtime as needed.    ? furosemide (LASIX) 20 MG tablet TAKE 1 TABLET BY MOUTH TWO TIMES A WEEK 26 tablet 5  ? Iron-FA-B Cmp-C-Biot-Probiotic (FUSION PLUS) CAPS One capsule twice daily with orange juice. Stop your Ferrous sulfate. (Patient taking differently: Take 1 capsule by mouth 2 (two) times daily. with orange juice. Stop your Ferrous sulfate.) 30 capsule 1  ? levonorgestrel (MIRENA, 52 MG,) 20 MCG/DAY IUD 1 each by Intrauterine route once.    ? levothyroxine (SYNTHROID) 100 MCG tablet Take 1 tablet (100 mcg total) by mouth daily. 90 tablet 1  ? levothyroxine (SYNTHROID) 137 MCG tablet Take 1 tablet (137 mcg total) by mouth daily before breakfast. 90 tablet 1  ? LORazepam (ATIVAN) 0.5 MG tablet Take 1 tablet (0.5 mg total) by mouth every 8 (eight) hours as needed for anxiety. 30 tablet 1  ? methocarbamol (ROBAXIN) 500 MG tablet Take 1 tablet (500 mg total) by mouth every 8 (eight) hours as needed for muscle spasms. 20 tablet 0  ? methotrexate (RHEUMATREX) 2.5 MG tablet TAKE 6 TABLETS BY MOUTH ONCE A WEEK. CAUTION: CHEMOTHERAPY. PROTECT FROM LIGHT 24 tablet 0  ? Multiple Vitamins-Minerals (MULTIVITAMIN WITH MINERALS) tablet Take 1 tablet by mouth daily.    ? nystatin cream (MYCOSTATIN) Apply 1 application topically 2 (two) times daily. Apply to affected area BID for up to 7 days. 30 g 1  ? omeprazole (PRILOSEC) 20 MG capsule Take 2 capsules (40 mg total) by mouth daily. 180 capsule 1  ? potassium chloride SA (KLOR-CON) 20 MEQ tablet Take 1 tablet (20 mEq total) by mouth 2 (two) times a week. 26 tablet 0  ? propranolol (INDERAL) 20 MG tablet Take 1 tablet (20 mg total) by mouth 3 (three) times daily. 270 tablet 3  ? topiramate (TOPAMAX) 50 MG tablet Start with (1 tab) at bedtime. In 1 week increase to (2 tabs) at bedtime. (Patient taking differently: Take 100 mg by mouth at bedtime.) 180 tablet 6  ? Vitamin D, Ergocalciferol, (DRISDOL) 1.25 MG (50000 UNIT) CAPS capsule Take 1 capsule (50,000 Units total)  by mouth 2 (two) times a week. 10 capsule 0  ? gabapentin (NEURONTIN) 300 MG capsule Take 1 capsule (300 mg total) by mouth at bedtime. 90 capsule 1  ? lurasidone (LATUDA) 40 MG TABS tablet Take 1 tablet (40 mg total) by mouth daily with supper. 90 tablet 0  ? sertraline (ZOLOFT) 100 MG tablet Take 2 tablets (200 mg total) by mouth daily. 180 tablet 0  ? ?No current facility-administered medications for this visit.  ? ? ?Medication Side Effects: None ? ?Allergies:  ?Allergies  ?Allergen Reactions  ? Augmentin [Amoxicillin-Pot Clavulanate] Hives  ? ? ?Past Medical History:  ?Diagnosis Date  ? Acid reflux   ? ADD (attention deficit disorder)   ? Anemia   ? Anxiety   ? Arthritis   ? left ankle  ? Back pain   ? Chest pain   ? Complication of anesthesia   ? Depression   ? Dyspnea   ? with exertion  ? Exercise-induced asthma   ? as a child  ? Fatty liver   ? History of blood transfusion   ? History of  chicken pox   ? History of kidney stones   ? passed   ? Hypothyroidism   ? Joint pain   ? Lower extremity edema   ? Major depressive disorder   ? Migraines   ? chronic  ? Palpitations   ? Pneumonia   ? x 2  ? PONV (postoperative nausea and vomiting)   ? nausea  ? RLS (restless legs syndrome)   ? Sleep apnea 2021  ? Thyroid disease   ? hypothyroid  ? ? ?Past Medical History, Surgical history, Social history, and Family history were reviewed and updated as appropriate.  ? ?Please see review of systems for further details on the patient's review from today.  ? ?Objective:  ? ?Physical Exam:  ?There were no vitals taken for this visit. ? ?Physical Exam ?Constitutional:   ?   General: She is not in acute distress. ?Musculoskeletal:     ?   General: No deformity.  ?Neurological:  ?   Mental Status: She is alert and oriented to person, place, and time.  ?   Coordination: Coordination normal.  ?Psychiatric:     ?   Attention and Perception: Attention and perception normal. She does not perceive auditory or visual hallucinations.      ?   Mood and Affect: Mood is anxious. Affect is not labile, blunt, angry or inappropriate.     ?   Speech: Speech normal.     ?   Behavior: Behavior normal.     ?   Thought Content: Thought content norm

## 2021-07-03 ENCOUNTER — Ambulatory Visit (HOSPITAL_BASED_OUTPATIENT_CLINIC_OR_DEPARTMENT_OTHER)
Admission: RE | Admit: 2021-07-03 | Discharge: 2021-07-03 | Disposition: A | Discharge status: Home / Self Care | Payer: BC Managed Care – PPO | Source: Ambulatory Visit | Attending: Pulmonary Disease | Admitting: Pulmonary Disease

## 2021-07-03 DIAGNOSIS — R918 Other nonspecific abnormal finding of lung field: Secondary | ICD-10-CM | POA: Diagnosis not present

## 2021-07-03 DIAGNOSIS — K449 Diaphragmatic hernia without obstruction or gangrene: Secondary | ICD-10-CM | POA: Diagnosis not present

## 2021-07-03 DIAGNOSIS — D869 Sarcoidosis, unspecified: Secondary | ICD-10-CM | POA: Diagnosis not present

## 2021-07-03 DIAGNOSIS — R590 Localized enlarged lymph nodes: Secondary | ICD-10-CM | POA: Diagnosis not present

## 2021-07-11 ENCOUNTER — Telehealth: Payer: Self-pay | Admitting: Pulmonary Disease

## 2021-07-11 ENCOUNTER — Encounter: Payer: Self-pay | Admitting: Pulmonary Disease

## 2021-07-11 DIAGNOSIS — D869 Sarcoidosis, unspecified: Secondary | ICD-10-CM

## 2021-07-11 MED ORDER — FOLIC ACID 1 MG PO TABS: 1.0000 mg | ORAL_TABLET | Freq: Every day | ORAL | 6 refills | Status: DC

## 2021-07-11 NOTE — Telephone Encounter (Signed)
Please let patient know I reached out to our pharmacy team about the dosing of methotrexate. They did not recommend splitting up the dose of her methotrexate, but they did recommend she take 2mg  of folic acid daily. I have sent in prescription for her to start this.  ? ?We also need to find a way to get her labs checked since this was not completed at last visit. ? ?Thanks, ?JD ?

## 2021-07-14 ENCOUNTER — Other Ambulatory Visit: Payer: Self-pay | Admitting: Pulmonary Disease

## 2021-07-14 DIAGNOSIS — D869 Sarcoidosis, unspecified: Secondary | ICD-10-CM

## 2021-07-17 NOTE — Telephone Encounter (Signed)
Called patient but she did not answer. Left message for her to call us back.  

## 2021-07-18 DIAGNOSIS — F4323 Adjustment disorder with mixed anxiety and depressed mood: Secondary | ICD-10-CM | POA: Diagnosis not present

## 2021-07-24 DIAGNOSIS — F4323 Adjustment disorder with mixed anxiety and depressed mood: Secondary | ICD-10-CM | POA: Diagnosis not present

## 2021-07-25 ENCOUNTER — Encounter: Payer: Self-pay | Admitting: Pulmonary Disease

## 2021-07-31 DIAGNOSIS — F4323 Adjustment disorder with mixed anxiety and depressed mood: Secondary | ICD-10-CM | POA: Diagnosis not present

## 2021-08-14 ENCOUNTER — Inpatient Hospital Stay: Payer: BC Managed Care – PPO | Attending: Hematology & Oncology

## 2021-08-14 DIAGNOSIS — N920 Excessive and frequent menstruation with regular cycle: Secondary | ICD-10-CM | POA: Diagnosis not present

## 2021-08-14 DIAGNOSIS — D500 Iron deficiency anemia secondary to blood loss (chronic): Secondary | ICD-10-CM | POA: Diagnosis not present

## 2021-08-14 LAB — CBC WITH DIFFERENTIAL (CANCER CENTER ONLY)
Abs Immature Granulocytes: 0.22 10*3/uL — ABNORMAL HIGH (ref 0.00–0.07)
Basophils Absolute: 0 10*3/uL (ref 0.0–0.1)
Basophils Relative: 1 %
Eosinophils Absolute: 0.3 10*3/uL (ref 0.0–0.5)
Eosinophils Relative: 4 %
HCT: 40.1 % (ref 36.0–46.0)
Hemoglobin: 13.1 g/dL (ref 12.0–15.0)
Immature Granulocytes: 3 %
Lymphocytes Relative: 22 %
Lymphs Abs: 1.6 10*3/uL (ref 0.7–4.0)
MCH: 30.1 pg (ref 26.0–34.0)
MCHC: 32.7 g/dL (ref 30.0–36.0)
MCV: 92.2 fL (ref 80.0–100.0)
Monocytes Absolute: 0.5 10*3/uL (ref 0.1–1.0)
Monocytes Relative: 6 %
Neutro Abs: 4.8 10*3/uL (ref 1.7–7.7)
Neutrophils Relative %: 64 %
Platelet Count: 131 10*3/uL — ABNORMAL LOW (ref 150–400)
RBC: 4.35 MIL/uL (ref 3.87–5.11)
RDW: 14.1 % (ref 11.5–15.5)
WBC Count: 7.4 10*3/uL (ref 4.0–10.5)
nRBC: 0 % (ref 0.0–0.2)

## 2021-08-14 LAB — RETICULOCYTES
Immature Retic Fract: 21.8 % — ABNORMAL HIGH (ref 2.3–15.9)
RBC.: 4.45 MIL/uL (ref 3.87–5.11)
Retic Count, Absolute: 190 10*3/uL — ABNORMAL HIGH (ref 19.0–186.0)
Retic Ct Pct: 4.3 % — ABNORMAL HIGH (ref 0.4–3.1)

## 2021-08-14 LAB — FERRITIN: Ferritin: 143 ng/mL (ref 11–307)

## 2021-08-15 DIAGNOSIS — F4323 Adjustment disorder with mixed anxiety and depressed mood: Secondary | ICD-10-CM | POA: Diagnosis not present

## 2021-08-15 LAB — IRON AND IRON BINDING CAPACITY (CC-WL,HP ONLY)
Iron: 73 ug/dL (ref 28–170)
Saturation Ratios: 21 % (ref 10.4–31.8)
TIBC: 342 ug/dL (ref 250–450)
UIBC: 269 ug/dL (ref 148–442)

## 2021-08-22 DIAGNOSIS — F4323 Adjustment disorder with mixed anxiety and depressed mood: Secondary | ICD-10-CM | POA: Diagnosis not present

## 2021-08-24 IMAGING — DX DG ANKLE COMPLETE 3+V*L*
3 series · 3 of 3 positions shown · non-contrast
Comparison: 02/20/2017

CLINICAL DATA: Twisting injury 1 week ago with persistent ankle
pain, initial encounter

EXAM:
LEFT ANKLE COMPLETE - 3+ VIEW

[ankle ap]
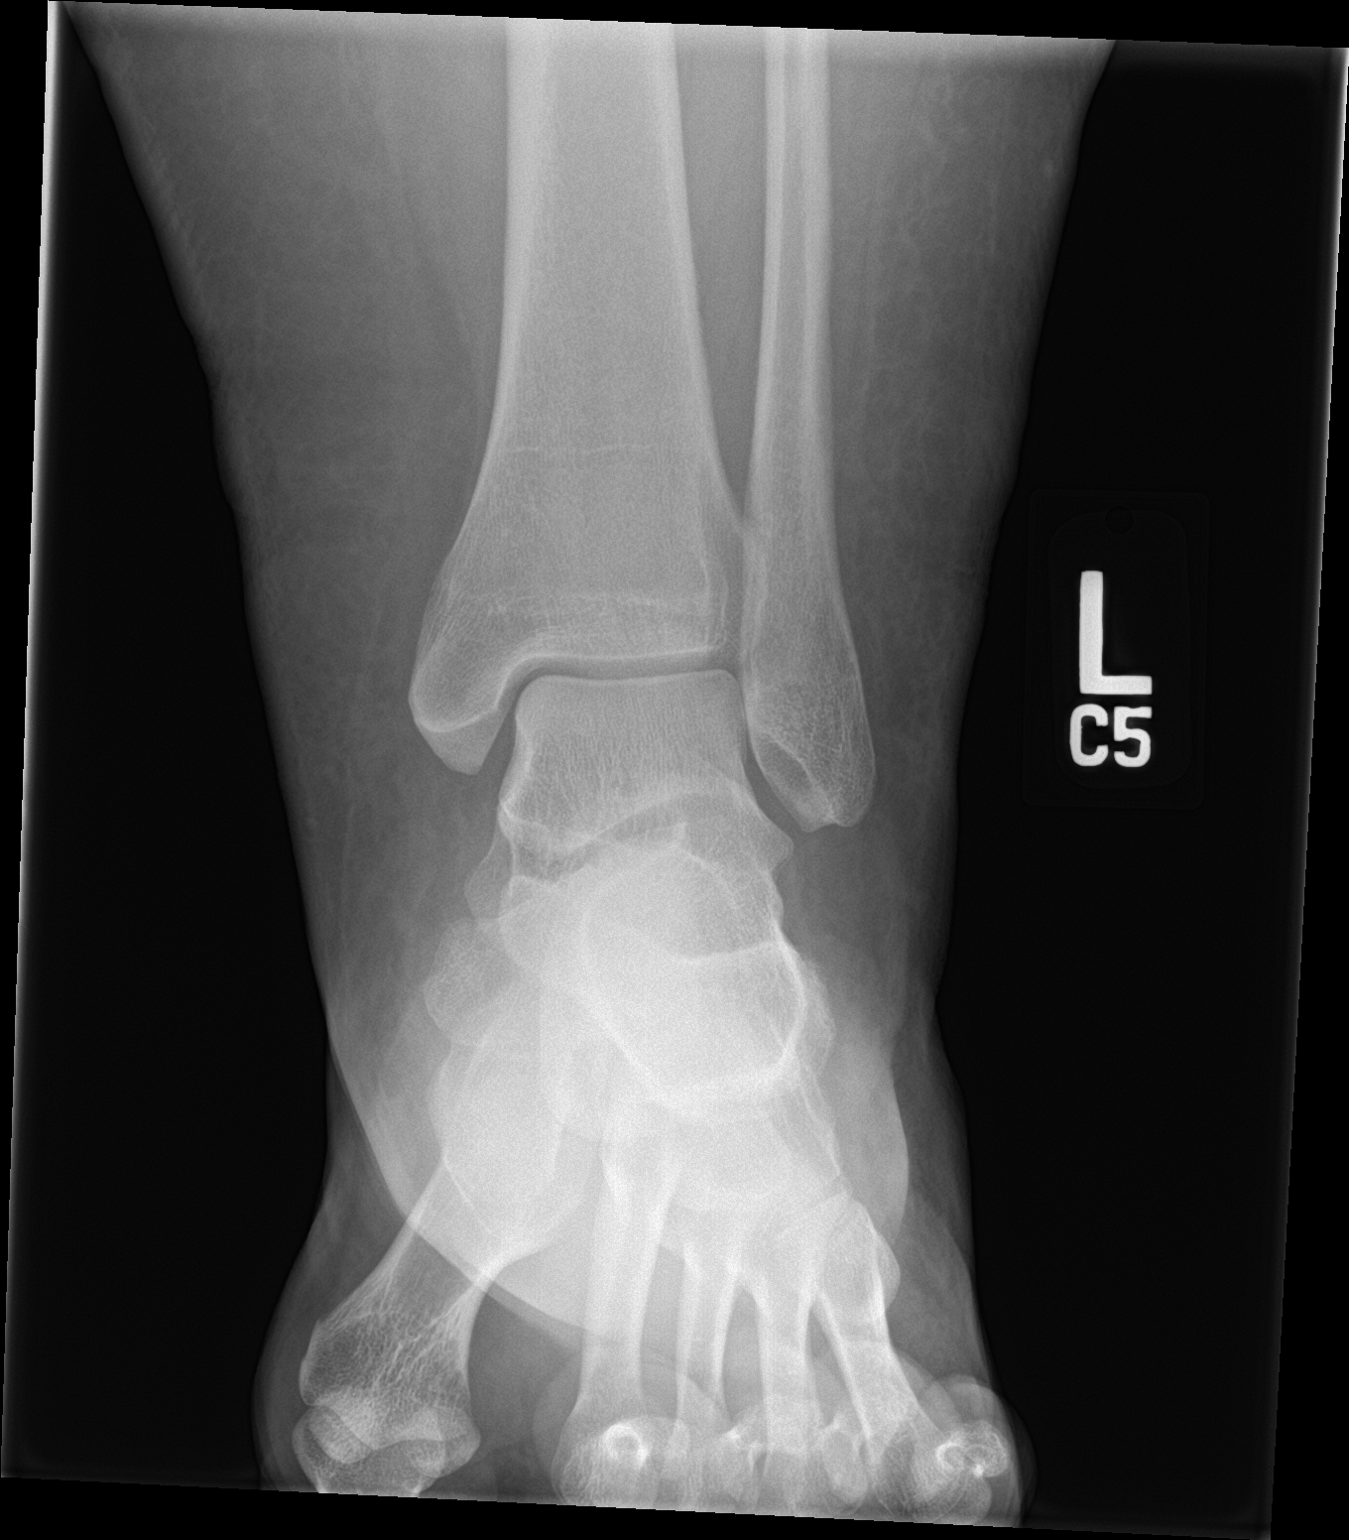

[ankle obl]
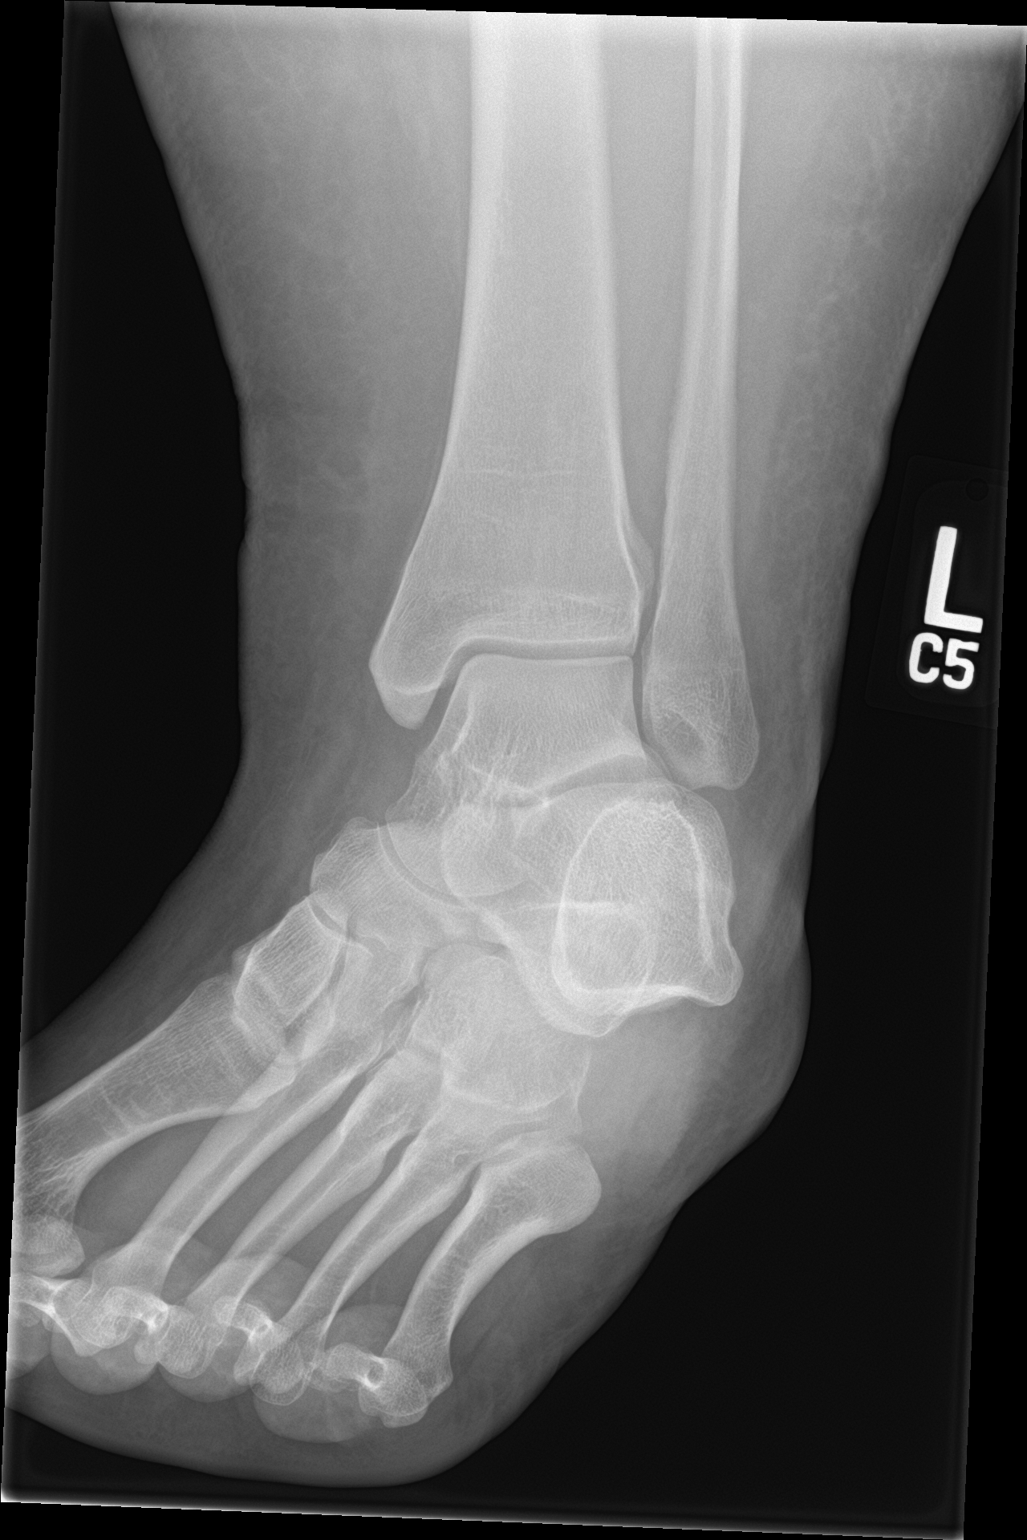

[ankle lat]
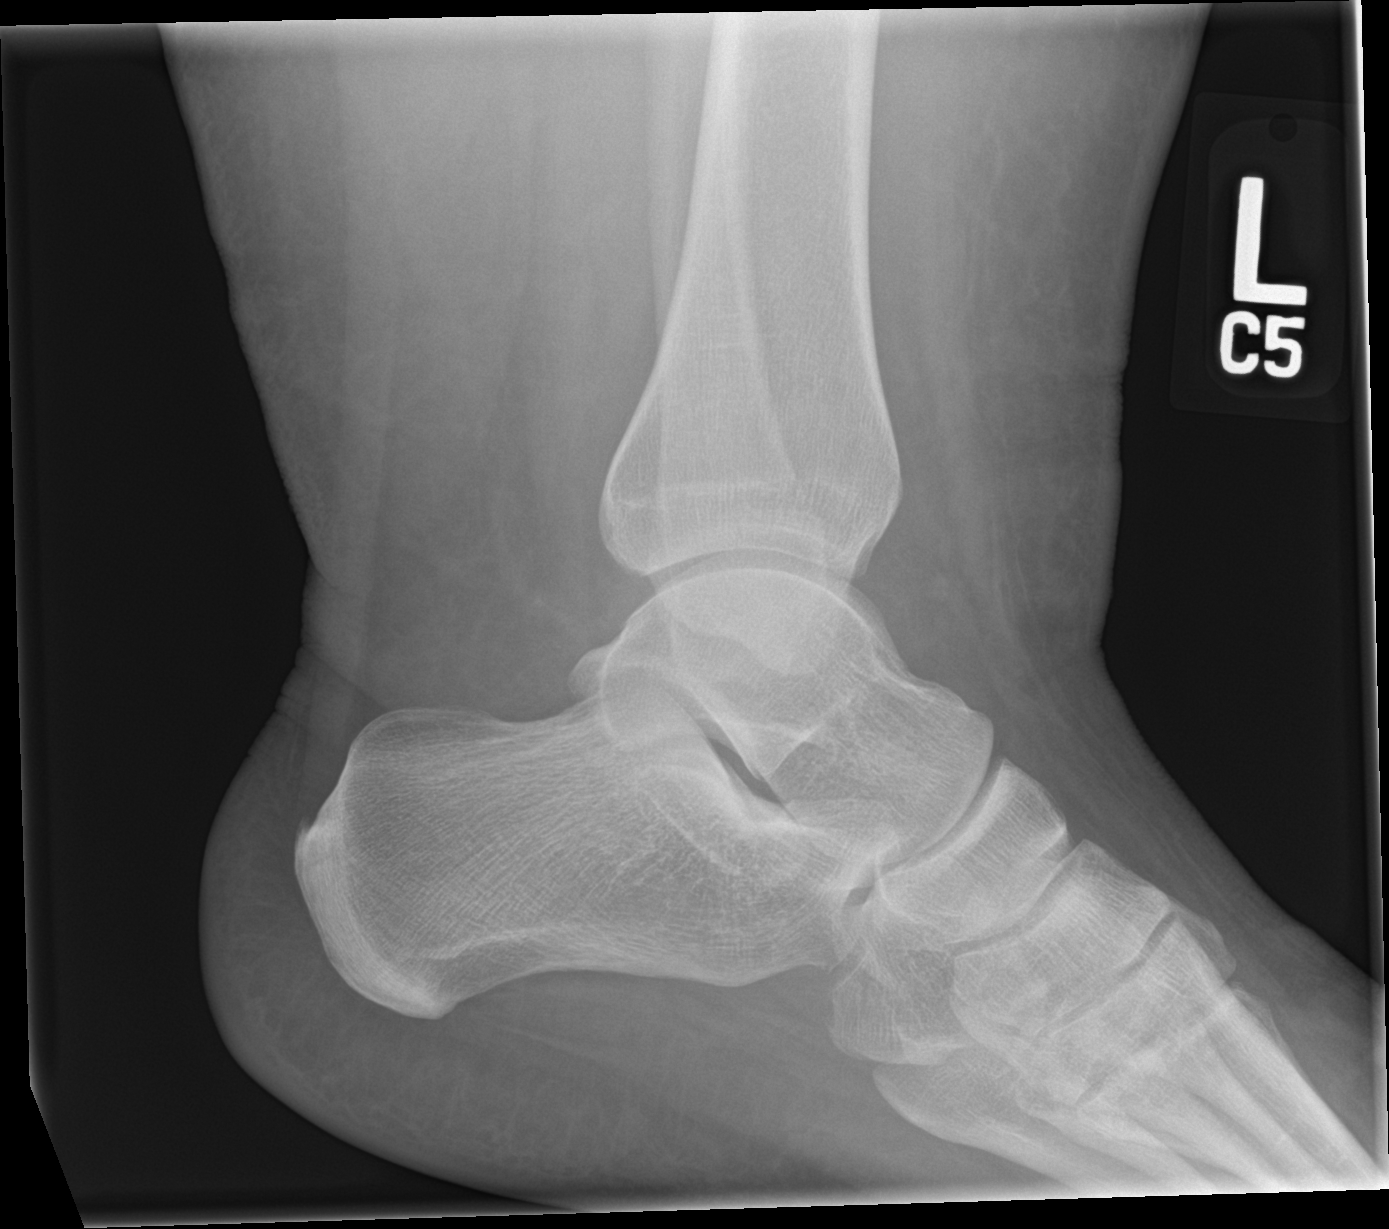

[3 of 3 positions shown; findings below may reference images not displayed]

FINDINGS: There is no evidence of fracture, dislocation, or joint effusion.
There is no evidence of arthropathy or other focal bone abnormality.
Soft tissues are unremarkable.
IMPRESSION: No acute abnormality noted.

## 2021-08-29 DIAGNOSIS — F4323 Adjustment disorder with mixed anxiety and depressed mood: Secondary | ICD-10-CM | POA: Diagnosis not present

## 2021-09-11 DIAGNOSIS — F4323 Adjustment disorder with mixed anxiety and depressed mood: Secondary | ICD-10-CM | POA: Diagnosis not present

## 2021-09-18 DIAGNOSIS — F4323 Adjustment disorder with mixed anxiety and depressed mood: Secondary | ICD-10-CM | POA: Diagnosis not present

## 2021-09-20 ENCOUNTER — Emergency Department (HOSPITAL_BASED_OUTPATIENT_CLINIC_OR_DEPARTMENT_OTHER)
Admission: EM | Admit: 2021-09-20 | Discharge: 2021-09-20 | Disposition: A | Discharge status: Home / Self Care | Payer: BC Managed Care – PPO | Attending: Emergency Medicine | Admitting: Emergency Medicine

## 2021-09-20 ENCOUNTER — Emergency Department (HOSPITAL_BASED_OUTPATIENT_CLINIC_OR_DEPARTMENT_OTHER): Payer: BC Managed Care – PPO

## 2021-09-20 ENCOUNTER — Encounter (HOSPITAL_BASED_OUTPATIENT_CLINIC_OR_DEPARTMENT_OTHER): Payer: Self-pay | Admitting: Emergency Medicine

## 2021-09-20 ENCOUNTER — Other Ambulatory Visit: Payer: Self-pay

## 2021-09-20 ENCOUNTER — Telehealth: Payer: Self-pay

## 2021-09-20 DIAGNOSIS — R079 Chest pain, unspecified: Secondary | ICD-10-CM | POA: Diagnosis not present

## 2021-09-20 DIAGNOSIS — R519 Headache, unspecified: Secondary | ICD-10-CM | POA: Diagnosis not present

## 2021-09-20 DIAGNOSIS — Z79899 Other long term (current) drug therapy: Secondary | ICD-10-CM | POA: Insufficient documentation

## 2021-09-20 DIAGNOSIS — E039 Hypothyroidism, unspecified: Secondary | ICD-10-CM | POA: Insufficient documentation

## 2021-09-20 DIAGNOSIS — R0789 Other chest pain: Secondary | ICD-10-CM | POA: Insufficient documentation

## 2021-09-20 DIAGNOSIS — R059 Cough, unspecified: Secondary | ICD-10-CM | POA: Insufficient documentation

## 2021-09-20 MED ORDER — DEXAMETHASONE 4 MG PO TABS
10.0000 mg | ORAL_TABLET | Freq: Once | ORAL | Status: AC
  Administered 2021-09-20: 10 mg via ORAL
  Filled 2021-09-20: qty 3

## 2021-09-20 MED ORDER — ACETAMINOPHEN 325 MG PO TABS
650.0000 mg | ORAL_TABLET | Freq: Once | ORAL | Status: AC
  Administered 2021-09-20: 650 mg via ORAL
  Filled 2021-09-20: qty 2

## 2021-09-20 NOTE — Telephone Encounter (Signed)
Pt in ED.  

## 2021-09-20 NOTE — Telephone Encounter (Signed)
Nurse Assessment Nurse: Lily Kocher, RN, Adriana Date/Time (Eastern Time): 09/20/2021 12:36:16 PM Confirm and document reason for call. If symptomatic, describe symptoms. ---pt reports chest tightness and labored breathing. has been ongoing since the weekend. pt has history of sarcoidosis. has an appt scheduled for Monday. sx are worse than usual. headaches is also present, constant for 3 days and has taken Excedrin with no help . 4/10 Does the patient have any new or worsening symptoms? ---Yes Will a triage be completed? ---Yes Related visit to physician within the last 2 weeks? ---No Does the PT have any chronic conditions? (i.e. diabetes, asthma, this includes High risk factors for pregnancy, etc.) ---Yes List chronic conditions. ---major depressive disorder anxiety sarcoidosis Is the patient pregnant or possibly pregnant? (Ask all females between the ages of 63-55) ---No Is this a behavioral health or substance abuse call? ---No Guidelines Guideline Title Affirmed Question Affirmed Notes Nurse Date/Time (Eastern Time) Chest Pain [1] Chest pain lasts > 5 minutes AND [2] described as crushing, Lily Kocher, RN, Adriana 09/20/2021 12:40:21 PM PLEASE NOTE: All timestamps contained within this report are represented as [place] Standard Time. CONFIDENTIALTY NOTICE: This fax transmission is intended only for the addressee. It contains information that is legally privileged, confidential or otherwise protected from use or disclosure. If you are not the intended recipient, you are strictly prohibited from reviewing, disclosing, copying using or disseminating any of this information or taking any action in reliance on or regarding this information. If you have received this fax in error, please notify us immediately by telephone so that we can arrange for its return to [place]. Phone: [phone], Toll-Free: [phone], Fax: [phone] Page: 2 of 2 Call Id: [id number] Guidelines Guideline Title  Affirmed Question Affirmed Notes Nurse Date/Time Lamount Cohen Time) pressure-like, or heavy Disp. Time Lamount Cohen Time) Disposition Final User 09/20/2021 12:33:22 PM Send to Urgent Queue Dennison Mascot 09/20/2021 12:43:50 PM 911 Outcome Documentation Lily Kocher, RN, Adriana Reason: refused. will go to hospital POV 09/20/2021 12:43:00 PM Call EMS 911 Now Yes Lily Kocher, RN, Ricki Rodriguez Caller Disagree/Comply Disagree Caller Understands Yes PreDisposition Call Doctor Care Advice Given Per Guideline CALL EMS 911 NOW: Referrals [facility] - ED

## 2021-09-20 NOTE — ED Provider Notes (Signed)
[facility] EMERGENCY DEPARTMENT Provider Note   CSN: [id number] Arrival date & time: 09/20/21  1311     History  Chief Complaint  Patient presents with   Chest Pain    Margaret Golden is a 31 y.o. female.  Patient here with cough, chest pain, mild headache.  Symptoms fairly constant for the last several days.  She feels like she is having may be a mild sarcoid flare.  History of migraines, restless legs, hypothyroidism, palpitations, reflux, sarcoidosis on methotrexate.  Denies any fever, chills.  No exertional chest pain.  No cardiac risk factors.  No family history of cardiac disease.  No recent travel or surgeries or estrogen use.  No blood clot history.  Excedrin has helped with her headaches.  No abdominal pain, weakness or numbness.  The history is provided by the patient.       Home Medications Prior to Admission medications   Medication Sig Start Date End Date Taking? Authorizing Provider  aspirin-acetaminophen-caffeine (EXCEDRIN MIGRAINE) [phone] MG tablet Take 2 tablets by mouth daily as needed for headache or migraine.    [provider]  diphenhydrAMINE HCl, Sleep, (ZZZQUIL PO) Take by mouth at bedtime as needed.    [provider]  folic acid (FOLVITE) 1 MG tablet Take 1 tablet (1 mg total) by mouth daily. 07/11/21   Martina Sinner, MD  furosemide (LASIX) 20 MG tablet TAKE 1 TABLET BY MOUTH TWO TIMES A WEEK 08/10/20   Tobb, Kardie, DO  gabapentin (NEURONTIN) 300 MG capsule Take 1 capsule (300 mg total) by mouth at bedtime. 06/29/21 09/27/21  Corie Chiquito, PMHNP  Iron-FA-B Cmp-C-Biot-Probiotic (FUSION PLUS) CAPS One capsule twice daily with orange juice. Stop your Ferrous sulfate. Patient taking differently: Take 1 capsule by mouth 2 (two) times daily. with orange juice. Stop your Ferrous sulfate. 06/22/19   Verner Chol, CNM  levonorgestrel (MIRENA, 52 MG,) 20 MCG/DAY IUD 1 each by Intrauterine route once.    [provider]  levothyroxine (SYNTHROID) 100 MCG tablet Take 1 tablet (100 mcg total) by mouth daily. 05/25/21   [name], NP  levothyroxine (SYNTHROID) 137 MCG tablet Take 1 tablet (137 mcg total) by mouth daily before breakfast. 05/25/21   [name], NP  LORazepam (ATIVAN) 0.5 MG tablet Take 1 tablet (0.5 mg total) by mouth every 8 (eight) hours as needed for anxiety. 11/02/20   Corie Chiquito, PMHNP  lurasidone (LATUDA) 40 MG TABS tablet Take 1 tablet (40 mg total) by mouth daily with supper. 06/29/21   Corie Chiquito, PMHNP  methocarbamol (ROBAXIN) 500 MG tablet Take 1 tablet (500 mg total) by mouth every 8 (eight) hours as needed for muscle spasms. 04/11/21   [name], NP  methotrexate (RHEUMATREX) 2.5 MG tablet TAKE 6 TABLETS BY MOUTH ONCE WEEKLY *PROTECT FROM LIGHT* 07/14/21   Martina Sinner, MD  Multiple Vitamins-Minerals (MULTIVITAMIN WITH MINERALS) tablet Take 1 tablet by mouth daily. 02/02/15   [name], NP  nystatin cream (MYCOSTATIN) Apply 1 application topically 2 (two) times daily. Apply to affected area BID for up to 7 days. 11/21/20   Romualdo Bolk, MD  omeprazole (PRILOSEC) 20 MG capsule Take 2 capsules (40 mg total) by mouth daily. 05/26/21   [name], NP  potassium chloride SA (KLOR-CON) 20 MEQ tablet Take 1 tablet (20 mEq total) by mouth 2 (two) times a week. 12/10/19   Tobb, Kardie, DO  propranolol (INDERAL) 20 MG tablet Take 1 tablet (20 mg total) by  mouth 3 (three) times daily. 03/04/20   Tobb, Kardie, DO  sertraline (ZOLOFT) 100 MG tablet Take 2 tablets (200 mg total) by mouth daily. 06/29/21   Corie Chiquito, PMHNP  topiramate (TOPAMAX) 50 MG tablet Start with 50mg (1 tab) at bedtime. In 1 week increase to 100mg (2 tabs) at bedtime. Patient taking differently: Take 100 mg by mouth at bedtime. 11/11/19   , MD  Vitamin D, Ergocalciferol, (DRISDOL) 1.25 MG (50000 UNIT) CAPS capsule Take 1 capsule (50,000 Units  total) by mouth 2 (two) times a week. 08/22/20   Anson Fret, PA-C      Allergies    Augmentin [amoxicillin-pot clavulanate]    Review of Systems   Review of Systems  Physical Exam Updated Vital Signs BP (!) 140/94   Pulse 94   Temp 98.7 F (37.1 C) (Oral)   Resp 16   Ht 5\' 7"  (1.702 m)   Wt (!) 173 kg   SpO2 97%   BMI 59.73 kg/m  Physical Exam Vitals and nursing note reviewed.  Constitutional:      General: She is not in acute distress.    Appearance: She is well-developed. She is not ill-appearing.  HENT:     Head: Normocephalic and atraumatic.  Eyes:     Extraocular Movements: Extraocular movements intact.     Conjunctiva/sclera: Conjunctivae normal.     Pupils: Pupils are equal, round, and reactive to light.  Cardiovascular:     Rate and Rhythm: Normal rate and regular rhythm.     Pulses:          Radial pulses are 2+ on the right side and 2+ on the left side.     Heart sounds: Normal heart sounds. No murmur heard. Pulmonary:     Effort: Pulmonary effort is normal. No respiratory distress.     Breath sounds: Normal breath sounds. No decreased breath sounds, wheezing or rhonchi.  Abdominal:     Palpations: Abdomen is soft.     Tenderness: There is no abdominal tenderness.  Musculoskeletal:        General: No swelling.     Cervical back: Normal range of motion and neck supple.     Right lower leg: No edema.     Left lower leg: No edema.  Skin:    General: Skin is warm and dry.     Capillary Refill: Capillary refill takes less than 2 seconds.  Neurological:     General: No focal deficit present.     Mental Status: She is alert and oriented to person, place, and time.     Cranial Nerves: No cranial nerve deficit.     Motor: No weakness.     Comments: 5+ out of 5 strength throughout, normal sensation, no drift, normal finger-nose-finger, normal speech  Psychiatric:        Mood and Affect: Mood normal.     ED Results / Procedures / Treatments    Labs (all labs ordered are listed, but only abnormal results are displayed) Labs Reviewed - No data to display  EKG EKG Interpretation  Date/Time:  Wednesday September 20 2021 13:31:07 EDT Ventricular Rate:  88 PR Interval:  125 QRS Duration: 92 QT Interval:  360 QTC Calculation: 436 R Axis:   52 Text Interpretation: Sinus rhythm Low voltage, precordial leads Confirmed by ([id number]) on 09/20/2021 1:39:41 PM  Radiology DG Chest Portable 1 View  Result Date: 09/20/2021 CLINICAL DATA:  Chest pain and tightness. EXAM: PORTABLE CHEST 1 VIEW  COMPARISON:  None Available. FINDINGS: The heart size and mediastinal contours are within normal limits. Both lungs are clear. The visualized skeletal structures are unremarkable. IMPRESSION: No active disease. Electronically Signed   By: Kerby Moors M.D.   On: 09/20/2021 14:07    Procedures Procedures    Medications Ordered in ED Medications  dexamethasone (DECADRON) tablet 10 mg (has no administration in time range)  acetaminophen (TYLENOL) tablet 650 mg (has no administration in time range)    ED Course/ Medical Decision Making/ A&P                           Medical Decision Making Amount and/or Complexity of Data Reviewed Radiology: ordered.   Georg Ruddle is here with chest pain and cough.  Unremarkable vitals.  No fever.  Patient with history of depression, restless leg syndrome, migraines, anxiety, palpitations, possibly sarcoidosis who presents to the ED with cough, nonspecific chest pain.  PERC negative doubt PE.  No cardiac risk factors and heart score 0 and noncardiac sounding chest pain.  Doubt ACS.  She has had may be cough.  Suspect viral process or inflammatory process.  EKG shows sinus rhythm.  No ischemic changes.  Chest x-ray ordered per my review and interpretation shows no pneumonia or pneumothorax.  Overall we will treat for inflammatory process with Decadron and Tylenol.  Recommend follow-up with primary care  doctor.  She is not having any abdominal pain have lower suspicion for reflux or any other intra-abdominal process.  Given reassurance and discharged in ED in good condition.  This chart was dictated using voice recognition software.  Despite best efforts to proofread,  errors can occur which can change the documentation meaning.         Final Clinical Impression(s) / ED Diagnoses Final diagnoses:  Atypical chest pain    Rx / DC Orders ED Discharge Orders     None         Lennice Sites, DO 09/20/21 1411

## 2021-09-20 NOTE — ED Triage Notes (Signed)
Pt c/o chest tightness since this weekend; currently being worked up for sarcoidosis; c/o SHOB and HA as well

## 2021-09-20 NOTE — Discharge Instructions (Signed)
Chest x-ray showed no evidence of pneumonia.  EKG is normal.  Overall suspect that this is either a muscular process or inflammatory process.  I have treated you with a long-acting steroid.  I recommend continued use of Tylenol.  Follow-up with primary care doctors pain is persistent.

## 2021-09-25 ENCOUNTER — Ambulatory Visit (INDEPENDENT_AMBULATORY_CARE_PROVIDER_SITE_OTHER): Payer: BC Managed Care – PPO | Admitting: Family

## 2021-09-25 VITALS — BP 139/83 | HR 90 | Temp 98.4°F | Resp 16 | Wt 387.0 lb

## 2021-09-25 DIAGNOSIS — F419 Anxiety disorder, unspecified: Secondary | ICD-10-CM

## 2021-09-25 DIAGNOSIS — D869 Sarcoidosis, unspecified: Secondary | ICD-10-CM | POA: Diagnosis not present

## 2021-09-25 DIAGNOSIS — Z6841 Body Mass Index (BMI) 40.0 and over, adult: Secondary | ICD-10-CM | POA: Diagnosis not present

## 2021-09-25 DIAGNOSIS — G4733 Obstructive sleep apnea (adult) (pediatric): Secondary | ICD-10-CM

## 2021-09-25 DIAGNOSIS — R0789 Other chest pain: Secondary | ICD-10-CM | POA: Insufficient documentation

## 2021-09-25 DIAGNOSIS — F32A Depression, unspecified: Secondary | ICD-10-CM | POA: Diagnosis not present

## 2021-09-25 LAB — CBC WITH DIFFERENTIAL/PLATELET
Basophils Absolute: 0 10*3/uL (ref 0.0–0.1)
Basophils Relative: 0.4 % (ref 0.0–3.0)
Eosinophils Absolute: 0.2 10*3/uL (ref 0.0–0.7)
Eosinophils Relative: 2 % (ref 0.0–5.0)
HCT: 40.3 % (ref 36.0–46.0)
Hemoglobin: 13.5 g/dL (ref 12.0–15.0)
Lymphocytes Relative: 26.7 % (ref 12.0–46.0)
Lymphs Abs: 2.4 10*3/uL (ref 0.7–4.0)
MCHC: 33.4 g/dL (ref 30.0–36.0)
MCV: 90 fl (ref 78.0–100.0)
Monocytes Absolute: 0.5 10*3/uL (ref 0.1–1.0)
Monocytes Relative: 5.8 % (ref 3.0–12.0)
Neutro Abs: 5.9 10*3/uL (ref 1.4–7.7)
Neutrophils Relative %: 65.1 % (ref 43.0–77.0)
Platelets: 156 10*3/uL (ref 150.0–400.0)
RBC: 4.48 Mil/uL (ref 3.87–5.11)
RDW: 15.4 % (ref 11.5–15.5)
WBC: 9 10*3/uL (ref 4.0–10.5)

## 2021-09-25 LAB — COMPREHENSIVE METABOLIC PANEL
ALT: 32 U/L (ref 0–35)
AST: 37 U/L (ref 0–37)
Albumin: 3.8 g/dL (ref 3.5–5.2)
Alkaline Phosphatase: 82 U/L (ref 39–117)
BUN: 8 mg/dL (ref 6–23)
CO2: 27 mEq/L (ref 19–32)
Calcium: 8.2 mg/dL — ABNORMAL LOW (ref 8.4–10.5)
Chloride: 105 mEq/L (ref 96–112)
Creatinine, Ser: 0.69 mg/dL (ref 0.40–1.20)
GFR: 115.54 mL/min (ref >60.00)
Glucose, Bld: 96 mg/dL (ref 70–99)
Potassium: 4.1 mEq/L (ref 3.5–5.1)
Sodium: 141 mEq/L (ref 135–145)
Total Bilirubin: 0.7 mg/dL (ref 0.2–1.2)
Total Protein: 6.2 g/dL (ref 6.0–8.3)

## 2021-09-25 MED ORDER — WEGOVY 0.25 MG/0.5ML ~~LOC~~ SOAJ: 0.2500 mg | SUBCUTANEOUS | 0 refills | Status: DC

## 2021-09-25 NOTE — Assessment & Plan Note (Signed)
>>  ASSESSMENT AND PLAN FOR DEPRESSION WRITTEN ON 09/25/2021 11:16 AM BY O'SULLIVAN, Lashika Erker, NP  Uncontrolled. Management per psychiatry.

## 2021-09-26 ENCOUNTER — Telehealth: Payer: Self-pay | Admitting: Family

## 2021-09-26 NOTE — Telephone Encounter (Signed)
Calcium is low. I would like her to return to the lab for additional testing including vit D testing.

## 2021-09-27 NOTE — Telephone Encounter (Signed)
Patient will come in tomorrow for pth intact and vitamin D

## 2021-09-28 ENCOUNTER — Telehealth: Payer: Self-pay | Admitting: *Deleted

## 2021-09-28 ENCOUNTER — Ambulatory Visit (INDEPENDENT_AMBULATORY_CARE_PROVIDER_SITE_OTHER): Payer: BC Managed Care – PPO | Admitting: Psychiatry

## 2021-09-28 ENCOUNTER — Other Ambulatory Visit (INDEPENDENT_AMBULATORY_CARE_PROVIDER_SITE_OTHER): Payer: BC Managed Care – PPO

## 2021-09-28 ENCOUNTER — Encounter: Payer: Self-pay | Admitting: Psychiatry

## 2021-09-28 DIAGNOSIS — F4010 Social phobia, unspecified: Secondary | ICD-10-CM

## 2021-09-28 DIAGNOSIS — F339 Major depressive disorder, recurrent, unspecified: Secondary | ICD-10-CM

## 2021-09-28 LAB — VITAMIN D 25 HYDROXY (VIT D DEFICIENCY, FRACTURES): VITD: 17.79 ng/mL — ABNORMAL LOW (ref 30.00–100.00)

## 2021-09-28 MED ORDER — SERTRALINE HCL 100 MG PO TABS: ORAL_TABLET | ORAL | 0 refills | Status: DC

## 2021-09-28 MED ORDER — LORAZEPAM 0.5 MG PO TABS: 0.5000 mg | ORAL_TABLET | Freq: Three times a day (TID) | ORAL | 1 refills | Status: DC | PRN

## 2021-09-28 MED ORDER — LURASIDONE HCL 40 MG PO TABS: ORAL_TABLET | ORAL | 0 refills | Status: DC

## 2021-09-28 NOTE — Telephone Encounter (Signed)
FYI

## 2021-09-28 NOTE — Telephone Encounter (Signed)
Prior auth denied.  "The requested service is not a covered benefit per your benefit booklet or plan documents." 

## 2021-09-28 NOTE — Progress Notes (Signed)
Margaret Golden [id number] [birth year] 30 y.o.  Subjective:   Patient ID:  Margaret Golden is a 31 y.o. (DOB 1990/11/28) female.  Chief Complaint:  Chief Complaint  Patient presents with   Depression   Anxiety   Sleeping Problem    HPI Margaret Golden presents to the office today for follow-up of anxiety, depression, and insomnia. She reports that she has been "anxious and stressed." Some stress about friends coming to visit next months. She has had episodes of panic. She reports that for the last few weeks she has had more constant anxiety. She has had more fatigue, shaking, and headaches. Has had more anxiety with going out the door and going to places. Had anxiety with talking about a week ago and would communicate only through text and electronic communication. She reports that she has had difficulty gauging time and that time seems to be going quickly and slowly at the same time. She has been socially withdrawn.   She reports that it is difficult to assess her mood due to anxiety being elevated. Not sleeping well and having trouble falling and staying asleep. Other days is wanting to sleep all day. Often unable to get to sleep until 5-6 am. Has to take OTC sleep meds to sleep. Energy and motivation have been low. Difficulty following through with tasks. Difficulty with concentration. Appetite fluctuates from one extreme to another. Denies SI.   Reports that she has not been taking psychiatric medications consistently for at least 2 weeks.   Therapist was away this week and has apt on Monday. Past medication trials: Wellbutrin- initially helpful and then was less helpful Lexapro-ineffective Cymbalta Pristiq Seroquel- does not recall any significant improvement Latuda Abilify Propranolol- prescribed for HR. Not helpful for anxiety Melatonin Vyvanse Adderall XR- not effective Concerta- Notices duration only lasts about 4 hours. No significant improvement with doses less than 54  mg.  [name]- May have caused n/v and poor concentration Horizant Lamictal-effective for mood Trazodone-effective for insomnia but causes some excessive drowsiness Rexulti- May have caused n/v and poor concentration Ativan- helpful Armodafinil- jaw locked  AIMS    Flowsheet Row Office Visit from 09/28/2021 in [facility]  AIMS Total Score 6      GAD-7    Flowsheet Row Office Visit from 09/25/2021 in [facility] Office Visit from 11/16/2020 in [facility]  Total GAD-7 Score 17 19      PHQ2-9    Flowsheet Row Office Visit from 09/25/2021 in [facility] Office Visit from 11/16/2020 in [facility] Office Visit from 09/03/2019 in [facility] Nutrition [facility]/10/2019 [facility] Office Visit from 05/02/2018 in [facility]  PHQ-2 Total Score 6 6 3 2 4   PHQ-9 Total Score 20 20 14  -- 15      Flowsheet Row ED from 09/20/2021 in [facility] Admission (Discharged) from 06/27/2020 in [facility] 4E CV SURGICAL PROGRESSIVE CARE Pre-Admission Testing 60 from 06/23/2020 in [facility] PREADMISSION TESTING  C-SSRS RISK CATEGORY No Risk No Risk No Risk        Review of Systems:  Review of Systems  Constitutional:  Positive for fatigue.  Respiratory:  Positive for chest tightness and shortness of breath.   Musculoskeletal:  Positive for arthralgias and back pain. Negative  for gait problem.  Neurological:  Positive for headaches.  Psychiatric/Behavioral:         Please refer to HPI    Medications: I have reviewed the patient's current medications.  Current Outpatient Medications  Medication Sig Dispense Refill   aspirin-acetaminophen-caffeine (EXCEDRIN MIGRAINE) 250-250-65 MG tablet  Take 2 tablets by mouth daily as needed for headache or migraine.     diphenhydrAMINE HCl, Sleep, (ZZZQUIL PO) Take by mouth at bedtime as needed.     levonorgestrel (MIRENA, 52 MG,) 20 MCG/DAY IUD 1 each by Intrauterine route once.     methotrexate (RHEUMATREX) 2.5 MG tablet TAKE 6 TABLETS BY MOUTH ONCE WEEKLY *PROTECT FROM LIGHT* 24 tablet 5   folic acid (FOLVITE) 1 MG tablet Take 1 tablet (1 mg total) by mouth daily. (Patient not taking: Reported on 09/28/2021) 60 tablet 6   furosemide (LASIX) 20 MG tablet TAKE 1 TABLET BY MOUTH TWO TIMES A WEEK (Patient not taking: Reported on 09/28/2021) 26 tablet 5   gabapentin (NEURONTIN) 300 MG capsule Take 1 capsule (300 mg total) by mouth at bedtime. 90 capsule 1   Iron-FA-B Cmp-C-Biot-Probiotic (FUSION PLUS) CAPS One capsule twice daily with orange juice. Stop your Ferrous sulfate. (Patient not taking: Reported on 09/28/2021) 30 capsule 1   levothyroxine (SYNTHROID) 100 MCG tablet Take 1 tablet (100 mcg total) by mouth daily. 90 tablet 1   levothyroxine (SYNTHROID) 137 MCG tablet Take 1 tablet (137 mcg total) by mouth daily before breakfast. 90 tablet 1   LORazepam (ATIVAN) 0.5 MG tablet Take 1 tablet (0.5 mg total) by mouth every 8 (eight) hours as needed for anxiety. 30 tablet 1   lurasidone (LATUDA) 40 MG TABS tablet Take 1/2 tablet daily with supper for one week, then increase to 1 tablet daily 90 tablet 0   methocarbamol (ROBAXIN) 500 MG tablet Take 1 tablet (500 mg total) by mouth every 8 (eight) hours as needed for muscle spasms. (Patient not taking: Reported on 09/28/2021) 20 tablet 0   Multiple Vitamins-Minerals (MULTIVITAMIN WITH MINERALS) tablet Take 1 tablet by mouth daily. (Patient not taking: Reported on 09/28/2021)     nystatin cream (MYCOSTATIN) Apply 1 application topically 2 (two) times daily. Apply to affected area BID for up to 7 days. (Patient not taking: Reported on 09/28/2021) 30 g 1   omeprazole (PRILOSEC) 20 MG capsule Take 2 capsules (40  mg total) by mouth daily. (Patient not taking: Reported on 09/28/2021) 180 capsule 1   potassium chloride SA (KLOR-CON) 20 MEQ tablet Take 1 tablet (20 mEq total) by mouth 2 (two) times a week. (Patient not taking: Reported on 09/28/2021) 26 tablet 0   propranolol (INDERAL) 20 MG tablet Take 1 tablet (20 mg total) by mouth 3 (three) times daily. (Patient not taking: Reported on 09/28/2021) 270 tablet 3   Semaglutide-Weight Management (WEGOVY) 0.25 MG/0.5ML SOAJ Inject 0.25 mg into the skin once a week. 2 mL 0   sertraline (ZOLOFT) 100 MG tablet Take 1/2 tablet daily for 1 week, then increase to 1 tablet daily for 1 week, then 1.5 tabs daily for 1 week, then 2 tablets daily 180 tablet 0   topiramate (TOPAMAX) 50 MG tablet Start with 50mg (1 tab) at bedtime. In 1 week increase to 100mg (2 tabs) at bedtime. (Patient not taking: Reported on 09/28/2021) 180 tablet 6   Vitamin D, Ergocalciferol, (DRISDOL) 1.25 MG (50000 UNIT) CAPS capsule Take 1 capsule (50,000 Units total) by mouth 2 (two) times a week. (Patient not taking: Reported on 09/28/2021)  10 capsule 0   No current facility-administered medications for this visit.    Medication Side Effects: None  Allergies:  Allergies  Allergen Reactions   Augmentin [Amoxicillin-Pot Clavulanate] Hives    Past Medical History:  Diagnosis Date   Acid reflux    ADD (attention deficit disorder)    Anemia    Anxiety    Arthritis    left ankle   Back pain    Chest pain    Complication of anesthesia    Depression    Dyspnea    with exertion   Exercise-induced asthma    as a child   Fatty liver    History of blood transfusion    History of chicken pox    History of kidney stones    passed    Hypothyroidism    Joint pain    Lower extremity edema    Major depressive disorder    Migraines    chronic   Palpitations    Pneumonia    x 2   PONV (postoperative nausea and vomiting)    nausea   RLS (restless legs syndrome)    Sleep apnea 2021    Thyroid disease    hypothyroid    Past Medical History, Surgical history, Social history, and Family history were reviewed and updated as appropriate.   Please see review of systems for further details on the patient's review from today.   Objective:   Physical Exam:  There were no vitals taken for this visit.  Physical Exam Constitutional:      General: She is not in acute distress. Musculoskeletal:        General: No deformity.  Neurological:     Mental Status: She is alert and oriented to person, place, and time.     Coordination: Coordination normal.  Psychiatric:        Attention and Perception: Attention and perception normal. She does not perceive auditory or visual hallucinations.        Mood and Affect: Mood is anxious and depressed. Affect is not labile, blunt, angry or inappropriate.        Speech: Speech normal.        Behavior: Behavior normal.        Thought Content: Thought content normal. Thought content is not paranoid or delusional. Thought content does not include homicidal or suicidal ideation. Thought content does not include homicidal or suicidal plan.        Cognition and Memory: Cognition and memory normal.        Judgment: Judgment normal.     Comments: Insight intact     Lab Review:     Component Value Date/Time   NA 141 09/25/2021 1118   K 4.1 09/25/2021 1118   CL 105 09/25/2021 1118   CO2 27 09/25/2021 1118   GLUCOSE 96 09/25/2021 1118   BUN 8 09/25/2021 1118   CREATININE 0.69 09/25/2021 1118   CREATININE 0.74 09/30/2019 1047   CREATININE 0.75 07/06/2016 1534   CALCIUM 8.2 (L) 09/25/2021 1118   PROT 6.2 09/25/2021 1118   ALBUMIN 3.8 09/25/2021 1118   AST 37 09/25/2021 1118   AST 27 09/30/2019 1047   ALT 32 09/25/2021 1118   ALT 14 09/30/2019 1047   ALKPHOS 82 09/25/2021 1118   BILITOT 0.7 09/25/2021 1118   BILITOT 0.4 09/30/2019 1047   GFRNONAA >60 06/28/2020 0703   GFRNONAA >60 09/30/2019 1047   GFRAA >60 09/30/2019 1047  Component Value Date/Time   WBC 9.0 09/25/2021 1118   RBC 4.48 09/25/2021 1118   HGB 13.5 09/25/2021 1118   HGB 13.1 08/14/2021 1431   HGB 9.3 (L) 08/26/2019 1045   HCT 40.3 09/25/2021 1118   HCT 33.4 (L) 08/26/2019 1045   PLT 156.0 09/25/2021 1118   PLT 131 (L) 08/14/2021 1431   PLT 234 08/26/2019 1045   MCV 90.0 09/25/2021 1118   MCV 72 (L) 08/26/2019 1045   MCH 30.1 08/14/2021 1431   MCHC 33.4 09/25/2021 1118   RDW 15.4 09/25/2021 1118   RDW 15.4 08/26/2019 1045   LYMPHSABS 2.4 09/25/2021 1118   MONOABS 0.5 09/25/2021 1118   EOSABS 0.2 09/25/2021 1118   BASOSABS 0.0 09/25/2021 1118    No results found for: "POCLITH", "LITHIUM"   No results found for: "PHENYTOIN", "PHENOBARB", "VALPROATE", "CBMZ"   .res Assessment: Plan:    Pt seen for 30 minutes and time spent discussing recent exacerbation of anxiety and depression. She reports that she has not taken medications for several weeks. Discussed strategy to re-start medications. Discussed re-starting 2 medications initially to help re-start of medication to be more manageable and attainable. Discussed that Latuda typically is effective in 1-2 weeks and this may help with improving symptoms quickly. She agrees to re-start Latuda 40 mg 1/2 tab po qd for one week, then one tablet daily for mood symptoms.  Pt agrees to re-start ing Sertraline for anxiety and depression. Will start Sertraline 50 mg po qd for one week, then 100 mg po qd for one week, then 150 mg po qd for one week, then 200 mg po qd.  Will re-start Ativan 0.5 mg po q 8 hours prn anxiety.  Pt to follow-up with this provider in 4 weeks or sooner if clinically indicated.  Patient advised to contact office with any questions, adverse effects, or acute worsening in signs and symptoms.   Tamerra was seen today for depression, anxiety and sleeping problem.  Diagnoses and all orders for this visit:  Major depression, recurrent, chronic (HCC) -     sertraline (ZOLOFT) 100  MG tablet; Take 1/2 tablet daily for 1 week, then increase to 1 tablet daily for 1 week, then 1.5 tabs daily for 1 week, then 2 tablets daily -     lurasidone (LATUDA) 40 MG TABS tablet; Take 1/2 tablet daily with supper for one week, then increase to 1 tablet daily  Social phobia -     sertraline (ZOLOFT) 100 MG tablet; Take 1/2 tablet daily for 1 week, then increase to 1 tablet daily for 1 week, then 1.5 tabs daily for 1 week, then 2 tablets daily -     LORazepam (ATIVAN) 0.5 MG tablet; Take 1 tablet (0.5 mg total) by mouth every 8 (eight) hours as needed for anxiety.     Please see After Visit Summary for patient specific instructions.  Future Appointments  Date Time Provider Department Center  10/11/2021  4:00 PM Glenford Bayley, NP [facility] None  10/27/2021 11:00 AM Corie Chiquito, PMHNP [facility] None  11/02/2021 10:30 AM Martina Sinner, MD [facility] None  11/02/2021  2:00 PM Butch Penny, NP [facility] None  11/23/2021  3:00 PM Romualdo Bolk, MD [facility] None  02/14/2022  1:00 PM Erenest Blank, NP [facility] None    No orders of the defined types were placed in this encounter.   -------------------------------

## 2021-09-28 NOTE — Telephone Encounter (Signed)
Prior auth started via cover my meds.  Awaiting determination.  Key: [id number]

## 2021-09-29 ENCOUNTER — Telehealth: Payer: Self-pay | Admitting: Family

## 2021-09-29 ENCOUNTER — Other Ambulatory Visit: Payer: Self-pay

## 2021-09-29 DIAGNOSIS — E559 Vitamin D deficiency, unspecified: Secondary | ICD-10-CM

## 2021-09-29 LAB — PTH, INTACT AND CALCIUM
Calcium: 8.9 mg/dL (ref 8.6–10.2)
PTH: 74 pg/mL (ref 16–77)

## 2021-09-29 MED ORDER — VITAMIN D (ERGOCALCIFEROL) 1.25 MG (50000 UNIT) PO CAPS: 50000.0000 [IU] | ORAL_CAPSULE | ORAL | 0 refills | Status: DC

## 2021-09-29 NOTE — Progress Notes (Signed)
Patient advised of results and she will call back to set up vitamin d check in 3 months. Orders entered

## 2021-09-29 NOTE — Telephone Encounter (Signed)
Patient advised of results and she will call to schedule vitamin d check in 3 months. Orders entered as future

## 2021-09-29 NOTE — Telephone Encounter (Signed)
Additional information fo\rm from [facility] for quantity limit exception filled out and faxed back.

## 2021-09-29 NOTE — Telephone Encounter (Signed)
Please continue vit D 50000 iu once weekly for 12 weeks, then repeat vit D level.  Follow up calcium testing is normal.

## 2021-10-02 ENCOUNTER — Ambulatory Visit: Payer: BC Managed Care – PPO | Admitting: Nurse Practitioner

## 2021-10-02 DIAGNOSIS — F4323 Adjustment disorder with mixed anxiety and depressed mood: Secondary | ICD-10-CM | POA: Diagnosis not present

## 2021-10-09 DIAGNOSIS — F4323 Adjustment disorder with mixed anxiety and depressed mood: Secondary | ICD-10-CM | POA: Diagnosis not present

## 2021-10-11 ENCOUNTER — Ambulatory Visit: Payer: BC Managed Care – PPO | Admitting: Primary Care

## 2021-10-11 ENCOUNTER — Encounter: Payer: Self-pay | Admitting: Primary Care

## 2021-10-11 VITALS — BP 130/80 | HR 89 | Temp 98.1°F | Ht 67.0 in | Wt 386.8 lb

## 2021-10-11 DIAGNOSIS — J45990 Exercise induced bronchospasm: Secondary | ICD-10-CM

## 2021-10-11 DIAGNOSIS — D869 Sarcoidosis, unspecified: Secondary | ICD-10-CM | POA: Diagnosis not present

## 2021-10-11 DIAGNOSIS — E669 Obesity, unspecified: Secondary | ICD-10-CM

## 2021-10-11 DIAGNOSIS — R0781 Pleurodynia: Secondary | ICD-10-CM

## 2021-10-11 DIAGNOSIS — R0789 Other chest pain: Secondary | ICD-10-CM

## 2021-10-11 LAB — NITRIC OXIDE: Nitric Oxide: 7

## 2021-10-11 MED ORDER — ALBUTEROL SULFATE HFA 108 (90 BASE) MCG/ACT IN AERS: 2.0000 | INHALATION_SPRAY | Freq: Four times a day (QID) | RESPIRATORY_TRACT | 2 refills | Status: DC | PRN

## 2021-10-11 NOTE — Progress Notes (Signed)
_0  ID: Margaret Golden, female    DOB: 11-May-1990, 31 y.o.   MRN: [id number]  Chief Complaint  Patient presents with   Follow-up    Pt states she feels a little worse compared to last visit. States doing daily activities is getting harder for her to do.    Referring provider: Debbrah Alar, NP  HPI: 31 year old female, never smoked.  Past medical history significant for hypertension, mediastinal lymphadenopathy, sarcoidosis and obesity.  Patient of Dr. Erin Fulling, last seen in office on 06/27/2021.  Patient was diagnosed with sarcoidosis via mediastinoscopy on 06/27/20.  Previous [facility] pulmonary encounter: 06/27/21- Dr. Renda Rolls Bowler is a 31 year old woman, non-smoker with history of anemia, hypertension and obesity who returns to pulmonary clinic for follow up of sarcoidosis.    She continues on 45m methotrexate weekly since last fall. She denies any improvement in her chest pains or dyspnea. No updated lab work since 01/2021. She has gained 14lbs since last visit. She complains of feeling sluggish and having brain fog for 2-3 days after taking her weekly dose of methotrexate.   She is not using CPAP.   OV 08/2020 She was started on methotrexate 518mweekly at last visit. She has tolerated the medication well thus far. She does not notice any change in her dyspnea on exertion. She continues to have intermittent chest pains.   Pulmonary function tests are normal overall but they show increased diffusion capacity.   OV 07/26/20 She developed chest discomfort and shortness of breath about 1 year ago. She was hospitalized for symptomatic anemia due to heavy menses. Her hemoglobin has remained in the normal range since but she continued to have the above symptoms. She was evaluated by Cardiology prior to hysteroscopy who performed an ECHO which was normal and a CTA coronary scan which showed mediastinal adenopathy and pulmonary nodules.   We have followed her thoracic adenopathy  with serial CT chest scans which demonstrated progression of the adenopathy prompting a PET scan on 04/27/20 which demonstrated increased uptake in her lymph nodes and also the right upper lobe nodule. She was taken for EBUS on 03/31/20 with sampling of the station 7 lymph node with non-diagnostic results. She was then referred to Thoracic Surgery who performed mediastinoscopy on 06/27/20 with lymph node samples  with non-caseating granulomas consistent with sarcoidosis.     She denies any vision changes, joint pains or skin rashes. She continues to have exertional dyspnea, chest pressure and has started to wake up short of breath at night at times.   No family members with sarcoidosis.   10/11/2021- Interim hx  Patient presents today for a 4-60-monthllow-up. She feels she is doing some worse since last visit.  She is needing more help around her house to complete ADLs. She experiences chest tightness and gets very short winded with any activity. She also reports right sided pleuritic pain when bending over or coughing since having biopsy. She is taking 300m46murontin at bedtime for restless leg. She is maintained on 15mg16mhotrexate weekly for her sarcoidosis. She reports experiencing symptoms of severe fatigue and headache after taking methotrexate dose. These symptoms will last on average 24 hours but fatigue can last up to 6 days. Pharmacy did not recommend splitting up methotrexate dose. She currently has an IUD for birthcontrol. She had CMET and CBC with PCP in June which were normal. CT chest in April 2023  showed decrease in prominence of mediastinal lymph nodes seen previously. Resolution pulmonary  nodules.   Allergies  Allergen Reactions   Augmentin [Amoxicillin-Pot Clavulanate] Hives    Immunization History  Administered Date(s) Administered   Influenza,inj,Quad PF,6+ Mos 01/03/2015, 11/28/2015, 04/19/2017, 12/12/2018, 01/20/2020   PFIZER(Purple Top)SARS-COV-2 Vaccination 06/25/2019,  07/20/2019   PNEUMOCOCCAL CONJUGATE-20 11/16/2020   Tdap 02/02/2015    Past Medical History:  Diagnosis Date   Acid reflux    ADD (attention deficit disorder)    Anemia    Anxiety    Arthritis    left ankle   Back pain    Chest pain    Complication of anesthesia    Depression    Dyspnea    with exertion   Exercise-induced asthma    as a child   Fatty liver    History of blood transfusion    History of chicken pox    History of kidney stones    passed    Hypothyroidism    Joint pain    Lower extremity edema    Major depressive disorder    Migraines    chronic   Palpitations    Pneumonia    x 2   PONV (postoperative nausea and vomiting)    nausea   RLS (restless legs syndrome)    Sleep apnea 2021   Thyroid disease    hypothyroid    Tobacco History: Social History   Tobacco Use  Smoking Status Never   Passive exposure: Never  Smokeless Tobacco Never   Counseling given: Not Answered   Outpatient Medications Prior to Visit  Medication Sig Dispense Refill   aspirin-acetaminophen-caffeine (EXCEDRIN MIGRAINE) 250-250-65 MG tablet Take 2 tablets by mouth daily as needed for headache or migraine.     diphenhydrAMINE HCl, Sleep, (ZZZQUIL PO) Take by mouth at bedtime as needed.     folic acid (FOLVITE) 1 MG tablet Take 1 tablet (1 mg total) by mouth daily. 60 tablet 6   furosemide (LASIX) 20 MG tablet TAKE 1 TABLET BY MOUTH TWO TIMES A WEEK 26 tablet 5   Iron-FA-B Cmp-C-Biot-Probiotic (FUSION PLUS) CAPS One capsule twice daily with orange juice. Stop your Ferrous sulfate. 30 capsule 1   levonorgestrel (MIRENA, 52 MG,) 20 MCG/DAY IUD 1 each by Intrauterine route once.     levothyroxine (SYNTHROID) 100 MCG tablet Take 1 tablet (100 mcg total) by mouth daily. 90 tablet 1   levothyroxine (SYNTHROID) 137 MCG tablet Take 1 tablet (137 mcg total) by mouth daily before breakfast. 90 tablet 1   LORazepam (ATIVAN) 0.5 MG tablet Take 1 tablet (0.5 mg total) by mouth every 8  (eight) hours as needed for anxiety. 30 tablet 1   lurasidone (LATUDA) 40 MG TABS tablet Take 1/2 tablet daily with supper for one week, then increase to 1 tablet daily 90 tablet 0   methocarbamol (ROBAXIN) 500 MG tablet Take 1 tablet (500 mg total) by mouth every 8 (eight) hours as needed for muscle spasms. 20 tablet 0   methotrexate (RHEUMATREX) 2.5 MG tablet TAKE 6 TABLETS BY MOUTH ONCE WEEKLY *PROTECT FROM LIGHT* 24 tablet 5   Multiple Vitamins-Minerals (MULTIVITAMIN WITH MINERALS) tablet Take 1 tablet by mouth daily.     nystatin cream (MYCOSTATIN) Apply 1 application topically 2 (two) times daily. Apply to affected area BID for up to 7 days. 30 g 1   omeprazole (PRILOSEC) 20 MG capsule Take 2 capsules (40 mg total) by mouth daily. 180 capsule 1   potassium chloride SA (KLOR-CON) 20 MEQ tablet Take 1 tablet (20 mEq total) by mouth  2 (two) times a week. 26 tablet 0   propranolol (INDERAL) 20 MG tablet Take 1 tablet (20 mg total) by mouth 3 (three) times daily. 270 tablet 3   sertraline (ZOLOFT) 100 MG tablet Take 1/2 tablet daily for 1 week, then increase to 1 tablet daily for 1 week, then 1.5 tabs daily for 1 week, then 2 tablets daily 180 tablet 0   topiramate (TOPAMAX) 50 MG tablet Start with 73m(1 tab) at bedtime. In 1 week increase to 1015m2 tabs) at bedtime. 180 tablet 6   Vitamin D, Ergocalciferol, (DRISDOL) 1.25 MG (50000 UNIT) CAPS capsule Take 1 capsule (50,000 Units total) by mouth 2 (two) times a week. 12 capsule 0   gabapentin (NEURONTIN) 300 MG capsule Take 1 capsule (300 mg total) by mouth at bedtime. 90 capsule 1   Semaglutide-Weight Management (WEGOVY) 0.25 MG/0.5ML SOAJ Inject 0.25 mg into the skin once a week. (Patient not taking: Reported on 10/11/2021) 2 mL 0   No facility-administered medications prior to visit.   Review of Systems  Review of Systems  Constitutional:  Positive for fatigue.  Respiratory:  Positive for chest tightness and shortness of breath.     Physical Exam  BP 130/80 (BP Location: Left Arm, Patient Position: Sitting, Cuff Size: Large)   Pulse 89   Temp 98.1 F (36.7 C) (Oral)   Ht 5' 7" (1.702 m)   Wt (!) 386 lb 12.8 oz (175.5 kg)   SpO2 98% Comment: RA  BMI 60.58 kg/m  Physical Exam Constitutional:      Appearance: Normal appearance. She is obese.  HENT:     Head: Normocephalic and atraumatic.  Cardiovascular:     Rate and Rhythm: Normal rate and regular rhythm.  Pulmonary:     Effort: Pulmonary effort is normal.     Breath sounds: Normal breath sounds. No wheezing, rhonchi or rales.  Neurological:     General: No focal deficit present.     Mental Status: She is alert and oriented to person, place, and time. Mental status is at baseline.  Psychiatric:        Mood and Affect: Mood normal.        Behavior: Behavior normal.        Thought Content: Thought content normal.        Judgment: Judgment normal.      Lab Results:  CBC    Component Value Date/Time   WBC 9.0 09/25/2021 1118   RBC 4.48 09/25/2021 1118   HGB 13.5 09/25/2021 1118   HGB 13.1 08/14/2021 1431   HGB 9.3 (L) 08/26/2019 1045   HCT 40.3 09/25/2021 1118   HCT 33.4 (L) 08/26/2019 1045   PLT 156.0 09/25/2021 1118   PLT 131 (L) 08/14/2021 1431   PLT 234 08/26/2019 1045   MCV 90.0 09/25/2021 1118   MCV 72 (L) 08/26/2019 1045   MCH 30.1 08/14/2021 1431   MCHC 33.4 09/25/2021 1118   RDW 15.4 09/25/2021 1118   RDW 15.4 08/26/2019 1045   LYMPHSABS 2.4 09/25/2021 1118   MONOABS 0.5 09/25/2021 1118   EOSABS 0.2 09/25/2021 1118   BASOSABS 0.0 09/25/2021 1118    BMET    Component Value Date/Time   NA 141 09/25/2021 1118   K 4.1 09/25/2021 1118   CL 105 09/25/2021 1118   CO2 27 09/25/2021 1118   GLUCOSE 96 09/25/2021 1118   BUN 8 09/25/2021 1118   CREATININE 0.69 09/25/2021 1118   CREATININE 0.74 09/30/2019 1047   CREATININE  0.75 07/06/2016 1534   CALCIUM 8.9 09/28/2021 1052   GFRNONAA >60 06/28/2020 0703   GFRNONAA >60 09/30/2019  1047   GFRAA >60 09/30/2019 1047    BNP No results found for: "BNP"  ProBNP No results found for: "PROBNP"  Imaging: DG Chest Portable 1 View  Result Date: 09/20/2021 CLINICAL DATA:  Chest pain and tightness. EXAM: PORTABLE CHEST 1 VIEW COMPARISON:  None Available. FINDINGS: The heart size and mediastinal contours are within normal limits. Both lungs are clear. The visualized skeletal structures are unremarkable. IMPRESSION: No active disease. Electronically Signed   By: Kerby Moors M.D.   On: 09/20/2021 14:07     Assessment & Plan:   Sarcoidosis - Patient was diagnosed with sarcoidosis via mediastinoscopy on 06/27/20. Currently maintained on methotrexate 15 mg weekly. She is having increased symptoms of chest tightness and dyspnea with exertion. CT chest in April 2023 showed decrease in prominence of lymphadenopathy and resolution of bilateral pulmonary nodules. She had a normal CXR in June 2023. Recommend checking ACE level. Unclear if these symptoms are related to acute sarcoid flare. May consider course of steriods. Pharmacy did not recommend splitting methotrexate dose to help with SE's she has been experiencing after taking her medication. We have advised she start taking 24m folic acid daily.  She currently has IUD for birth control.  She had a CBC and c-Met with her PCP in June which was normal.  She has a follow-up already scheduled in August with Dr. DErin Fulling   Pleuritic chest pain - Patient has been having intermittent right sided pleuritic chest pain since bx in March 2022. Likely nerve pain. Will discuss increasing Neurontin with Dr. DErin Fulling Advised she follow-up with cardiology as well.   Exercise-induced asthma - FENO was normal today - Sending in Albuterol hfa to use as needed for chest tightness/sob/wheezing   Super obesity - Awaiting approval of Wegovy through her insurance    EMartyn Ehrich NP 10/11/2021

## 2021-10-11 NOTE — Assessment & Plan Note (Addendum)
-   Awaiting approval of Wegovy through her insurance

## 2021-10-11 NOTE — Assessment & Plan Note (Addendum)
-   Patient has been having intermittent right sided pleuritic chest pain since bx in March 2022. Likely nerve pain. Will discuss increasing Neurontin with Dr. Francine Graven. Advised she follow-up with cardiology as well.

## 2021-10-11 NOTE — Assessment & Plan Note (Addendum)
-  Patient was diagnosed with sarcoidosis via mediastinoscopy on 06/27/20. Currently maintained on methotrexate 15 mg weekly. She is having increased symptoms of chest tightness and dyspnea with exertion. CT chest in April 2023 showed decrease in prominence of lymphadenopathy and resolution of bilateral pulmonary nodules. She had a normal CXR in June 2023. Recommend checking ACE level. Unclear if these symptoms are related to acute sarcoid flare. May consider course of steriods. Pharmacy did not recommend splitting methotrexate dose to help with SE's she has been experiencing after taking her medication. We have advised she start taking 2mg folic acid daily.  She currently has IUD for birth control.  She had a CBC and c-Met with her PCP in June which was normal.  She has a follow-up already scheduled in August with Dr. Dewald.  

## 2021-10-11 NOTE — Patient Instructions (Addendum)
-   Labs were normal in June - Most recent CT of your chest showed improvement in lymphadenopathy and resolution of pulmonary nodules - Does not appear that your sarcoidosis is flared at this time, we will check ACE level and if elevated may potentially use course of steroids - Recommend following up with cardiology due to chest tightness with exertion - Encouraged weight loss efforts- hopefully Reginal Lutes will be covered by your insurance, if not discussed with PCP alternatives  Recommendations: - Start folic acid 2mg  daily - Continue methotrexate 15mg  weekly (Pharmacy did not recommend splitting your dose of methotrexate) - I will discuss increasing Neurontin 300mg  three times daily for nerve pain with Dr.   Orders: -ACE level -FENO re: chest tightness  Follow-up: - 3 months with Dr. 

## 2021-10-11 NOTE — Assessment & Plan Note (Signed)
-   FENO was normal today - Sending in Albuterol hfa to use as needed for chest tightness/sob/wheezing

## 2021-10-17 DIAGNOSIS — F4323 Adjustment disorder with mixed anxiety and depressed mood: Secondary | ICD-10-CM | POA: Diagnosis not present

## 2021-10-23 DIAGNOSIS — F4323 Adjustment disorder with mixed anxiety and depressed mood: Secondary | ICD-10-CM | POA: Diagnosis not present

## 2021-10-27 ENCOUNTER — Encounter: Payer: Self-pay | Admitting: Psychiatry

## 2021-10-27 ENCOUNTER — Ambulatory Visit (INDEPENDENT_AMBULATORY_CARE_PROVIDER_SITE_OTHER): Payer: BC Managed Care – PPO | Admitting: Psychiatry

## 2021-10-27 DIAGNOSIS — F4010 Social phobia, unspecified: Secondary | ICD-10-CM

## 2021-10-27 DIAGNOSIS — F339 Major depressive disorder, recurrent, unspecified: Secondary | ICD-10-CM

## 2021-10-27 DIAGNOSIS — G2581 Restless legs syndrome: Secondary | ICD-10-CM | POA: Diagnosis not present

## 2021-10-27 MED ORDER — LURASIDONE HCL 40 MG PO TABS: ORAL_TABLET | ORAL | 0 refills | Status: DC

## 2021-10-27 MED ORDER — SERTRALINE HCL 100 MG PO TABS: 200.0000 mg | ORAL_TABLET | Freq: Every day | ORAL | 0 refills | Status: DC

## 2021-10-27 MED ORDER — GABAPENTIN 300 MG PO CAPS: 300.0000 mg | ORAL_CAPSULE | Freq: Every day | ORAL | 1 refills | Status: DC

## 2021-10-27 MED ORDER — LORAZEPAM 0.5 MG PO TABS: 0.5000 mg | ORAL_TABLET | Freq: Three times a day (TID) | ORAL | 1 refills | Status: DC | PRN

## 2021-10-27 NOTE — Progress Notes (Unsigned)
Margaret Golden [id number] 02/22/1991 31 y.o.  Subjective:   Patient ID:  Margaret Famaylor A Balestrieri is a 31 y.o. (DOB 10/04/1990) female.  Chief Complaint:  Chief Complaint  Patient presents with   Anxiety    Anxiety Symptoms include shortness of breath.     Margaret Famaylor A Angevine presents to the office today for follow-up of anxiety, depression, and insomnia. Continued anxiety. Reports recently seeing things in her peripheral vision. Reports that this happened on and off for years and has recently resurfaced. She reports that it happens intermittently and more often at night. Has had long-standing sensation of something crawling on her and biting her. Reports that she has not seen anything biting her. She questions if this may be stress induced. Reports VH as a teenager. Denies AH. Denies paranoia.   She reports "depression has taken a back burner to the stress and anxiety." She reports frequent worry. Notices some shortness of breath and increased HR with severe anxiety. She reports that she has not had a panic attack since last visit. Has social anxiety with people she does not know well. She has been going to the game store on occasion. Denies any obsessive thoughts or compulsions.   Difficulty falling asleep. Needing to nap daily. Sleep is fragmented. Reports that she may be sleeping excessively. Motivation has recently improved slightly. Energy has been low with extreme heat and A/C went out. Appetite fluctuates- "as a whole it has gotten better." Reports over-eating has been less. Most days eating 1-2 times daily. She reports that concentration is diminished at times. Some difficulty with distractions and re-directing focus. Enjoying video games and reading again. Wants to get back into audiobooks. Very rare passive death wishes. Denies SI. She identifies multiple reasons for living.   Has been able to re-start some medications. Plans to set up pill box tonight with all medications now that house is  more organized.   Reports that Lorazepam is effective when she takes it.   Had a friend come over to help de-clutter and this was helpful. Now has living room cleaned out. Best friend came to visit for 5 days and felt "my social batteries were drained."   Past medication trials: Wellbutrin- initially helpful and then was less helpful Lexapro-ineffective Cymbalta Pristiq Seroquel- does not recall any significant improvement Latuda Abilify Propranolol- prescribed for HR. Not helpful for anxiety Melatonin Vyvanse Adderall XR- not effective Concerta- Notices duration only lasts about 4 hours. No significant improvement with doses less than 54 mg.  Marnee Springdhansia- May have caused n/v and poor concentration Horizant Lamictal-effective for mood Trazodone-effective for insomnia but causes some excessive drowsiness Rexulti- May have caused n/v and poor concentration Ativan- helpful Armodafinil- jaw locked  AIMS    Flowsheet Row Office Visit from 09/28/2021 in [facility]  AIMS Total Score 6      GAD-7    Flowsheet Row [facility] [place] [facility] [place]  Total GAD-7 Score 17 19      PHQ2-9    Flowsheet Row [facility] [place] [facility] [place] [facility] Nutrition [facility]/10/2019 [facility] [place]  PHQ-2 Total Score 6 6  3 2 4   PHQ-9 Total Score 20 20 14  -- 15      Flowsheet Row [facility] Admission (Discharged) from 06/27/2020 in [facility] 4E CV SURGICAL PROGRESSIVE CARE Pre-Admission Testing 60 from  06/23/2020 in [facility]MISS[facility]  C-SSRS RISK CATEGORY No Risk No Risk No Risk        Review of Systems:  Review of Systems  HENT:  Positive for tinnitus.   Respiratory:  Positive for shortness of breath.   Musculoskeletal:  Positive for arthralgias and back pain. Negative for gait problem.       Tingling in hand  Neurological:  Positive for headaches.  Psychiatric/Behavioral:         Please refer to HPI    Medications: I have reviewed the patient's current medications.  Current Outpatient Medications  Medication Sig Dispense Refill   albuterol (VENTOLIN HFA) 108 (90 Base) MCG/ACT inhaler Inhale 2 puffs into the lungs every 6 (six) hours as needed for wheezing or shortness of breath. 8 g 2   aspirin-acetaminophen-caffeine (EXCEDRIN MIGRAINE) 250-250-65 MG tablet Take 2 tablets by mouth daily as needed for headache or migraine.     diphenhydrAMINE HCl, Sleep, (ZZZQUIL PO) Take by mouth at bedtime as needed.     folic acid (FOLVITE) 1 MG tablet Take 1 tablet (1 mg total) by mouth daily. 60 tablet 6   levonorgestrel (MIRENA, 52 MG,) 20 MCG/DAY IUD 1 each by Intrauterine route once.     levothyroxine (SYNTHROID) 100 MCG tablet Take 1 tablet (100 mcg total) by mouth daily. 90 tablet 1   levothyroxine (SYNTHROID) 137 MCG tablet Take 1 tablet (137 mcg total) by mouth daily before breakfast. 90 tablet 1   methocarbamol (ROBAXIN) 500 MG tablet Take 1 tablet (500 mg total) by mouth every 8 (eight) hours as needed for muscle spasms. 20 tablet 0   methotrexate (RHEUMATREX) 2.5 MG tablet TAKE 6 TABLETS BY MOUTH ONCE WEEKLY *PROTECT FROM LIGHT* 24 tablet 5   Multiple Vitamins-Minerals (MULTIVITAMIN WITH MINERALS) tablet Take 1 tablet by mouth daily.     nystatin cream (MYCOSTATIN) Apply 1 application topically 2 (two) times daily. Apply to affected area BID for up to 7 days. 30 g 1   omeprazole (PRILOSEC) 20 MG capsule Take 2 capsules (40 mg total) by mouth daily. 180  capsule 1   propranolol (INDERAL) 20 MG tablet Take 1 tablet (20 mg total) by mouth 3 (three) times daily. (Patient taking differently: Take 20 mg by mouth 2 (two) times daily.) 270 tablet 3   topiramate (TOPAMAX) 50 MG tablet Start with 50mg (1 tab) at bedtime. In 1 week increase to 100mg (2 tabs) at bedtime. 180 tablet 6   furosemide (LASIX) 20 MG tablet TAKE 1 TABLET BY MOUTH TWO TIMES A WEEK 26 tablet 5   gabapentin (NEURONTIN) 300 MG capsule Take 1 capsule (300 mg total) by mouth at bedtime. 90 capsule 1   Iron-FA-B Cmp-C-Biot-Probiotic (FUSION PLUS) CAPS One capsule twice daily with orange juice. Stop your Ferrous sulfate. (Patient not taking: Reported on 10/27/2021) 30 capsule 1   [START ON 11/24/2021] LORazepam (ATIVAN) 0.5 MG tablet Take 1 tablet (0.5 mg total) by mouth every 8 (eight) hours as needed for anxiety. 30 tablet 1   lurasidone (LATUDA) 40 MG TABS tablet Take 1/2 tablet daily with supper for one week, then increase to 1 tablet daily 90 tablet 0   potassium chloride SA (KLOR-CON) 20 MEQ tablet Take 1  tablet (20 mEq total) by mouth 2 (two) times a week. 26 tablet 0   Semaglutide-Weight Management (WEGOVY) 0.25 MG/0.5ML SOAJ Inject 0.25 mg into the skin once a week. (Patient not taking: Reported on 10/11/2021) 2 mL 0   sertraline (ZOLOFT) 100 MG tablet Take 2 tablets (200 mg total) by mouth daily. 180 tablet 0   Vitamin D, Ergocalciferol, (DRISDOL) 1.25 MG (50000 UNIT) CAPS capsule Take 1 capsule (50,000 Units total) by mouth 2 (two) times a week. (Patient not taking: Reported on 10/27/2021) 12 capsule 0   No current facility-administered medications for this visit.    Medication Side Effects: Other: Some headaches with re-starting medication  Allergies:  Allergies  Allergen Reactions   Augmentin [Amoxicillin-Pot Clavulanate] Hives    Past Medical History:  Diagnosis Date   Acid reflux    ADD (attention deficit disorder)    Anemia    Anxiety    Arthritis    left ankle    Back pain    Chest pain    Complication of anesthesia    Depression    Dyspnea    with exertion   Exercise-induced asthma    as a child   Fatty liver    History of blood transfusion    History of chicken pox    History of kidney stones    passed    Hypothyroidism    Joint pain    Lower extremity edema    Major depressive disorder    Migraines    chronic   Palpitations    Pneumonia    x 2   PONV (postoperative nausea and vomiting)    nausea   RLS (restless legs syndrome)    Sleep apnea 2021   Thyroid disease    hypothyroid    Past Medical History, Surgical history, Social history, and Family history were reviewed and updated as appropriate.   Please see review of systems for further details on the patient's review from today.   Objective:   Physical Exam:  There were no vitals taken for this visit.  Physical Exam Constitutional:      General: She is not in acute distress. Musculoskeletal:        General: No deformity.  Neurological:     Mental Status: She is alert and oriented to person, place, and time.     Coordination: Coordination normal.  Psychiatric:        Attention and Perception: Attention and perception normal. She does not perceive auditory or visual hallucinations.        Mood and Affect: Mood is anxious. Affect is not labile, blunt, angry or inappropriate.        Speech: Speech normal.        Behavior: Behavior normal.        Thought Content: Thought content normal. Thought content is not paranoid or delusional. Thought content does not include homicidal or suicidal ideation. Thought content does not include homicidal or suicidal plan.        Cognition and Memory: Cognition and memory normal.        Judgment: Judgment normal.     Comments: Insight intact     Lab Review:     Component Value Date/Time   NA 141 09/25/2021 1118   K 4.1 09/25/2021 1118   CL 105 09/25/2021 1118   CO2 27 09/25/2021 1118   GLUCOSE 96 09/25/2021 1118   BUN 8  09/25/2021 1118   CREATININE 0.69 09/25/2021 1118   CREATININE 0.74  09/30/2019 1047   CREATININE 0.75 07/06/2016 1534   CALCIUM 8.9 09/28/2021 1052   PROT 6.2 09/25/2021 1118   ALBUMIN 3.8 09/25/2021 1118   AST 37 09/25/2021 1118   AST 27 09/30/2019 1047   ALT 32 09/25/2021 1118   ALT 14 09/30/2019 1047   ALKPHOS 82 09/25/2021 1118   BILITOT 0.7 09/25/2021 1118   BILITOT 0.4 09/30/2019 1047   GFRNONAA >60 06/28/2020 0703   GFRNONAA >60 09/30/2019 1047   GFRAA >60 09/30/2019 1047       Component Value Date/Time   WBC 9.0 09/25/2021 1118   RBC 4.48 09/25/2021 1118   HGB 13.5 09/25/2021 1118   HGB 13.1 08/14/2021 1431   HGB 9.3 (L) 08/26/2019 1045   HCT 40.3 09/25/2021 1118   HCT 33.4 (L) 08/26/2019 1045   PLT 156.0 09/25/2021 1118   PLT 131 (L) 08/14/2021 1431   PLT 234 08/26/2019 1045   MCV 90.0 09/25/2021 1118   MCV 72 (L) 08/26/2019 1045   MCH 30.1 08/14/2021 1431   MCHC 33.4 09/25/2021 1118   RDW 15.4 09/25/2021 1118   RDW 15.4 08/26/2019 1045   LYMPHSABS 2.4 09/25/2021 1118   MONOABS 0.5 09/25/2021 1118   EOSABS 0.2 09/25/2021 1118   BASOSABS 0.0 09/25/2021 1118    No results found for: "POCLITH", "LITHIUM"   No results found for: "PHENYTOIN", "PHENOBARB", "VALPROATE", "CBMZ"   .res Assessment: Plan:    Pt seen for 30 minutes and time spent discussing response to re-starting medications. Recommended taking Latuda more consistently since they may be helpful for mood, anxiety, and sleep.  Recommend starting Vit D supplementation since low Vitamin D levels can contribute to low mood and energy  She reports that she will include Vit D when setting up med bo.  Continue Gabapentin 300 mg po QHD for insomnia and anxiety.  Continue Ativan 0.5 mg as needed for anxiety.  Continue Sertraline 200 mg po qd for anxiety and depression.  Continue Latuda 40 mg po qd for mood symptoms.  Recommend continuing psychotherapy.  Pt to follow-up in 2 months or sooner if clinically  indicated.  Patient advised to contact office with any questions, adverse effects, or acute worsening in signs and symptoms.   [place] was seen today for anxiety.  Diagnoses and all orders for this visit:  Social phobia -     gabapentin (NEURONTIN) 300 MG capsule; Take 1 capsule (300 mg total) by mouth at bedtime. -     LORazepam (ATIVAN) 0.5 MG tablet; Take 1 tablet (0.5 mg total) by mouth every 8 (eight) hours as needed for anxiety. -     sertraline (ZOLOFT) 100 MG tablet; Take 2 tablets (200 mg total) by mouth daily.  RLS (restless legs syndrome) -     gabapentin (NEURONTIN) 300 MG capsule; Take 1 capsule (300 mg total) by mouth at bedtime.  Major depression, recurrent, chronic (HCC) -     lurasidone (LATUDA) 40 MG TABS tablet; Take 1/2 tablet daily with supper for one week, then increase to 1 tablet daily -     sertraline (ZOLOFT) 100 MG tablet; Take 2 tablets (200 mg total) by mouth daily.     Please see After Visit Summary for patient specific instructions.  Future Appointments  Date Time Provider Department Center  11/02/2021 10:30 AM Martina Sinner, MD [facility] None  11/02/2021  2:00 PM Butch Penny, NP [facility] None  11/23/2021  3:00 PM Romualdo Bolk, MD [facility] None  12/28/2021  2:30 PM  Corie Chiquito, PMHNP [facility] None  02/14/2022  1:00 PM Erenest Blank, NP [facility] None    No orders of the defined types were placed in this encounter.   -------------------------------

## 2021-10-31 DIAGNOSIS — F4323 Adjustment disorder with mixed anxiety and depressed mood: Secondary | ICD-10-CM | POA: Diagnosis not present

## 2021-11-01 ENCOUNTER — Encounter: Payer: Self-pay | Admitting: *Deleted

## 2021-11-02 ENCOUNTER — Ambulatory Visit (INDEPENDENT_AMBULATORY_CARE_PROVIDER_SITE_OTHER): Payer: BC Managed Care – PPO | Admitting: Adult Health

## 2021-11-02 ENCOUNTER — Encounter: Payer: Self-pay | Admitting: Adult Health

## 2021-11-02 ENCOUNTER — Ambulatory Visit: Payer: BC Managed Care – PPO | Admitting: Pulmonary Disease

## 2021-11-02 VITALS — BP 142/84 | HR 86 | Ht 67.0 in | Wt 388.0 lb

## 2021-11-02 DIAGNOSIS — G43709 Chronic migraine without aura, not intractable, without status migrainosus: Secondary | ICD-10-CM

## 2021-11-02 DIAGNOSIS — G4733 Obstructive sleep apnea (adult) (pediatric): Secondary | ICD-10-CM | POA: Diagnosis not present

## 2021-11-02 DIAGNOSIS — R519 Headache, unspecified: Secondary | ICD-10-CM

## 2021-11-02 NOTE — Progress Notes (Signed)
PATIENT: Margaret Golden DOB: Dec 06, 1990  REASON FOR VISIT: follow up HISTORY FROM: patient  Chief Complaint  Patient presents with   Follow-up    Pt in 19  pr here for migraine f/u Pt states her migraines are doing ok  Pt still is having headaches in am . Pt states at times she get sharp pains in back of eyes. Pt states stop wearing CPAP   '  HISTORY OF PRESENT ILLNESS: Today 11/02/21: Mr. Margaret Golden is a 31 year old female with a hsitory of migraine headaches and OSA. She returns today for follow-up.  Reports that she wakes up with a headache on occasion like 2-3 times a week. Reports that she may have a migraine 1-3 days a month. Taking Topamax 100 mg BID. Summer months headaches get worse due to allergies. She reports that she is not sleeping well. Would like to get to started back on CPAP. Would like to use a different DME company.   1/26/23[name] is a 30 year old female with a history of migraine headaches and obstructive sleep apnea.  She returns today for follow-up.  The patient did try using her CPAP.  The download is below.  When she was using it her apnea was well treated.  The patient states that she felt worse using the machine then when she was not using machine.  Therefore she turned the machine back in.  She also states that customer service with the DME company was bad.  She states that they were not very helpful with getting her a mask that she was comfortable with.  Patient reports that she continues to have daily headaches.  Typically wakes up with them.  Has approximate 1-2 migraines a week.  At the last visit she was instructed to increase Topamax but she does not think she has done this.  She will check her prescription at home  01/01/21: [name] is a 31 year old female with a history of migraine headaches and obstructive sleep apnea..  She returns today for follow-up.  The patient received a CPAP machine.  She only used it 2 nights out of the last 90 days for  less than 30 minutes.  She states that when she puts it on the pressure is too high and caused her to have a migraine.  The patient's machine is set on auto 6-16.  According to her download the pressure never went above 5 cm of water.  Patient reports that she continues to have daily headaches.  Also reports shortness of breath for which she is seeing pulmonology and reports She needs to use her CPAP machine.  She returns today for an evaluation.  08/17/20: [name] is a 31 year old female with a history of migraine headaches.  She returns today for follow-up.  She states that her headaches have improved.  She has approximately 2-3 migraines a month.  She states Excedrin Migraine usually resolves her headaches.  She was unable to state how many regular headache she has each month.  The patient did do a sleep study that showed moderate to severe sleep apnea but titration study was never scheduled.  02/16/20: [name] is a 31 year old female with a history of migraines.  She returns today for follow-up.  She states that she wakes up at least 3 times a week with a headache.  Sometimes the headache gets better as the day goes on sometimes and lingers all day or may even get worse.  She states that with these headaches  she does not have photophobia or phonophobia.  Reports some nausea but no vomiting.  She states approximately once every other week sometimes more often she will have a headache that feels like a tight headband around her head.  She denies photophobia and phonophobia.  Reports nausea.  She continues to take Topamax 100 mg at bedtime.  Reports that she tried Maxalt with no benefit.  She was referred for sleep study.  She had this last week and is awaiting results.  Returns today for evaluation.   HISTORY Margaret Golden is a 31 y.o. female here as requested by Quillian Quince, MD for migraines.  Past medical history thyroid disease, restless leg syndrome, migraines, major depressive disorder,  lower extremity edema, joint pain, hypothyroidism, history of kidney stones, fatty liver, exercise-induced asthma, depression, back pain, arthritis, anxiety, ADD, HTN, obesity.  I reviewed Dr. Francena Hanly notes, patient reports chronic migraines, can have up to 10-14 headaches per month, she has been using over-the-counter Excedrin Migraine 250 mg 2 tablets as needed, she also has depression with anxiety, fatigue and low energy it may be related to obesity depression or many other causes including migraine, behavior modification was discussed and she is currently in the action stage of change for weight loss.    I was able to review neurologist Dr. Moises Blood notes he saw patient in 2019, patient reported 7 out of 10 intensity, 3-4 headache days a month which 1 to 2 days are migraines, no NSAIDs, she tried Excedrin Migraine in the past, she also tried sertraline and Topamax, lamotrigine and Horizant in the past as well, rare alcohol no smoker, depression controlled, migraine started as a teenager and became more frequent at age 41, bandlike distribution, pressure stabbing, no aura no prodrome no post room, associated symptoms include photophobia, phonophobia, nausea, no vomiting or visual disturbance often, not waking her up from sleep, can last up to 3 days with a total of 7 headache days a month initially and improved to 3-4 headache days a month at follow-up appointment.  Neurologic exam was normal.  Topiramate was continued at that time.   Migraines have gotten worse, she has been managing with excedrin and aspirin, was taking once a week but now worsening for >3 months and worsenined with anemia when she was hospitalized and migraines worsening. She is waking up with headaches, a few years ago she had a sleep test and it was negative, tired during the day, very fatigued, daily headaches in the morning and feels like a band around her head, migraines are pulsating/pounding/throbbing, photophobia/phonophobia,  nausea, no vomiting, it hurts to move, she reports "slight double vision", laying down and sleeping helps, she is getting 14 migraine days a month, suffering all are moderate to severe, no medication overuse, no aura, can last 24-48 hours untreated if she catches them soon they can last a few hours. She had one that last 1.5 weeks in the past. She has associated vertigo/dizziness but no weakness or sensory changes. She drinks caffeine is trying to cut back but she has a caffeine "addiction" and we discussed rebound headache.    From a thorough review of records: Medication tried that can be used in migraine management include ibuprofen, Aleve, Tylenol, Fioricet, sumatriptan, Zofran, propranolol, Topamax, bupropion, Excedrin Migraine, lamotrigine, Horizant, metoprolol.   Reviewed notes, labs and imaging from outside physicians, which showed:   Ferritin 6, TSH 18.3, CMP unremarkable    REVIEW OF SYSTEMS: Out of a complete 14 system review of symptoms, the  patient complains only of the following symptoms, and all other reviewed systems are negative.  FSS 61 ESS 6  ALLERGIES: Allergies  Allergen Reactions   Augmentin [Amoxicillin-Pot Clavulanate] Hives    HOME MEDICATIONS: Outpatient Medications Prior to Visit  Medication Sig Dispense Refill   albuterol (VENTOLIN HFA) 108 (90 Base) MCG/ACT inhaler Inhale 2 puffs into the lungs every 6 (six) hours as needed for wheezing or shortness of breath. 8 g 2   aspirin-acetaminophen-caffeine (EXCEDRIN MIGRAINE) 250-250-65 MG tablet Take 2 tablets by mouth daily as needed for headache or migraine.     diphenhydrAMINE HCl, Sleep, (ZZZQUIL PO) Take by mouth at bedtime as needed.     folic acid (FOLVITE) 1 MG tablet Take 1 tablet (1 mg total) by mouth daily. 60 tablet 6   furosemide (LASIX) 20 MG tablet TAKE 1 TABLET BY MOUTH TWO TIMES A WEEK 26 tablet 5   gabapentin (NEURONTIN) 300 MG capsule Take 1 capsule (300 mg total) by mouth at bedtime. 90 capsule 1    Iron-FA-B Cmp-C-Biot-Probiotic (FUSION PLUS) CAPS One capsule twice daily with orange juice. Stop your Ferrous sulfate. 30 capsule 1   levonorgestrel (MIRENA, 52 MG,) 20 MCG/DAY IUD 1 each by Intrauterine route once.     levothyroxine (SYNTHROID) 100 MCG tablet Take 1 tablet (100 mcg total) by mouth daily. 90 tablet 1   levothyroxine (SYNTHROID) 137 MCG tablet Take 1 tablet (137 mcg total) by mouth daily before breakfast. 90 tablet 1   [START ON 11/24/2021] LORazepam (ATIVAN) 0.5 MG tablet Take 1 tablet (0.5 mg total) by mouth every 8 (eight) hours as needed for anxiety. 30 tablet 1   lurasidone (LATUDA) 40 MG TABS tablet Take 1/2 tablet daily with supper for one week, then increase to 1 tablet daily 90 tablet 0   methocarbamol (ROBAXIN) 500 MG tablet Take 1 tablet (500 mg total) by mouth every 8 (eight) hours as needed for muscle spasms. 20 tablet 0   methotrexate (RHEUMATREX) 2.5 MG tablet TAKE 6 TABLETS BY MOUTH ONCE WEEKLY *PROTECT FROM LIGHT* 24 tablet 5   Multiple Vitamins-Minerals (MULTIVITAMIN WITH MINERALS) tablet Take 1 tablet by mouth daily.     nystatin cream (MYCOSTATIN) Apply 1 application topically 2 (two) times daily. Apply to affected area BID for up to 7 days. 30 g 1   omeprazole (PRILOSEC) 20 MG capsule Take 2 capsules (40 mg total) by mouth daily. 180 capsule 1   potassium chloride SA (KLOR-CON) 20 MEQ tablet Take 1 tablet (20 mEq total) by mouth 2 (two) times a week. 26 tablet 0   propranolol (INDERAL) 20 MG tablet Take 1 tablet (20 mg total) by mouth 3 (three) times daily. (Patient taking differently: Take 20 mg by mouth 2 (two) times daily.) 270 tablet 3   sertraline (ZOLOFT) 100 MG tablet Take 2 tablets (200 mg total) by mouth daily. 180 tablet 0   topiramate (TOPAMAX) 50 MG tablet Start with 50mg (1 tab) at bedtime. In 1 week increase to 100mg (2 tabs) at bedtime. 180 tablet 6   Vitamin D, Ergocalciferol, (DRISDOL) 1.25 MG (50000 UNIT) CAPS capsule Take 1 capsule (50,000 Units  total) by mouth 2 (two) times a week. 12 capsule 0   Semaglutide-Weight Management (WEGOVY) 0.25 MG/0.5ML SOAJ Inject 0.25 mg into the skin once a week. (Patient not taking: Reported on 10/11/2021) 2 mL 0   No facility-administered medications prior to visit.    PAST MEDICAL HISTORY: Past Medical History:  Diagnosis Date   Acid reflux  ADD (attention deficit disorder)    Anemia    Anxiety    Arthritis    left ankle   Back pain    Chest pain    Complication of anesthesia    Depression    Dyspnea    with exertion   Exercise-induced asthma    as a child   Fatty liver    History of blood transfusion    History of chicken pox    History of kidney stones    passed    Hypothyroidism    Joint pain    Lower extremity edema    Major depressive disorder    Migraines    chronic   Palpitations    Pneumonia    x 2   PONV (postoperative nausea and vomiting)    nausea   RLS (restless legs syndrome)    Sleep apnea 2021   Thyroid disease    hypothyroid    PAST SURGICAL HISTORY: Past Surgical History:  Procedure Laterality Date   BRONCHIAL NEEDLE ASPIRATION BIOPSY  03/31/2020   Procedure: BRONCHIAL NEEDLE ASPIRATION BIOPSIES;  Surgeon: Garner Nash, DO;  Location: [facility];  Service: Pulmonary;;   BRONCHIAL WASHINGS  03/31/2020   Procedure: BRONCHIAL WASHINGS;  Surgeon: Garner Nash, DO;  Location: [facility];  Service: Pulmonary;;   DILATATION & CURETTAGE/HYSTEROSCOPY WITH MYOSURE N/A 03/22/2020   Procedure: [place];  Surgeon: Salvadore Dom, MD;  Location: [facility];  Service: Gynecology;  Laterality: N/A;   INTERCOSTAL NERVE BLOCK Right 06/27/2020   Procedure: INTERCOSTAL NERVE BLOCK;  Surgeon: Lajuana Matte, MD;  Location: [facility];  Service: Thoracic;  Laterality: Right;   INTRAUTERINE DEVICE (IUD) INSERTION N/A 03/22/2020   Procedure: INTRAUTERINE DEVICE (IUD) INSERTION;  Surgeon: Salvadore Dom, MD;   Location: [facility];  Service: Gynecology;  Laterality: N/A;  Mirena IUD insertion   KNEE ARTHROSCOPY Right 2007   LYMPH NODE BIOPSY Right 06/27/2020   Procedure: LYMPH NODE BIOPSY;  Surgeon: Lajuana Matte, MD;  Location: [facility];  Service: Thoracic;  Laterality: Right;   TONSILLECTOMY  2010   TONSILLECTOMY     VIDEO BRONCHOSCOPY WITH ENDOBRONCHIAL ULTRASOUND N/A 03/31/2020   Procedure: VIDEO BRONCHOSCOPY WITH ENDOBRONCHIAL ULTRASOUND;  Surgeon: Garner Nash, DO;  Location: [facility];  Service: Pulmonary;  Laterality: N/A;   WISDOM TOOTH EXTRACTION     WISDOM TOOTH EXTRACTION Bilateral    upper and lower    FAMILY HISTORY: Family History  Problem Relation Age of Onset   Depression Mother    Obesity Mother        died from "an infection" ? MRSA   Depression Father    Sleep apnea Father    Obesity Father    Arthritis Maternal Grandmother    Breast cancer Maternal Grandmother        in remission   Diabetes Maternal Grandfather    Arthritis Maternal Grandfather    Arthritis Paternal Grandmother    Arthritis Paternal Grandfather    Cancer Paternal Aunt        colon   Cancer Paternal Uncle        brain cancer   Migraines Neg Hx     SOCIAL HISTORY: Social History   Socioeconomic History   Marital status: Single    Spouse name: Not on file   Number of children: 0   Years of education: 12   Highest education level: Not on file  Occupational History   Occupation: Sales promotion account executive  employee  Tobacco Use   Smoking status: Never    Passive exposure: Never   Smokeless tobacco: Never  Vaping Use   Vaping Use: Never used  Substance and Sexual Activity   Alcohol use: Not Currently    Comment: rare social drink maybe once "every 5 blue moons"   Drug use: No   Sexual activity: Never    Comment: never sexually active  Other Topics Concern   Not on file  Social History Narrative   Unemployed   Lives alone --update 11/11/19   Seeking work   Only child    Lives in a one  story house with father, has a cat-12/26/16-sjb      Caffeine: 2-4 cups/day   Social Determinants of Radio broadcast assistant Strain: Not on file  Food Insecurity: Not on file  Transportation Needs: Not on file  Physical Activity: Not on file  Stress: Not on file  Social Connections: Not on file  Intimate Partner Violence: Not on file      PHYSICAL EXAM  Vitals:   11/02/21 1357  BP: (!) 142/84  Pulse: 86  Weight: (!) 388 lb (176 kg)  Height: 5\' 7"  (1.702 m)   Body mass index is 60.77 kg/m.  Generalized: Well developed, in no acute distress   Neurological examination  Mentation: Alert oriented to time, place, history taking. Follows all commands speech and language fluent Cranial nerve II-XII: Pupils were equal round reactive to light. Extraocular movements were full, visual field were full on confrontational test. Facial sensation and strength were normal. Uvula tongue midline. Mallampati 3+ Head turning and shoulder shrug  were normal and symmetric. Neck Circumference is 19 inches Motor: The motor testing reveals 5 over 5 strength of all 4 extremities. Good symmetric motor tone is noted throughout.  Sensory: Sensory testing is intact to soft touch on all 4 extremities. No evidence of extinction is noted.  Coordination: Cerebellar testing reveals good finger-nose-finger and heel-to-shin bilaterally.  Gait and station: Gait is normal.    DIAGNOSTIC DATA (LABS, IMAGING, TESTING) - I reviewed patient records, labs, notes, testing and imaging myself where available.  Lab Results  Component Value Date   WBC 9.0 09/25/2021   HGB 13.5 09/25/2021   HCT 40.3 09/25/2021   MCV 90.0 09/25/2021   PLT 156.0 09/25/2021      Component Value Date/Time   NA 141 09/25/2021 1118   K 4.1 09/25/2021 1118   CL 105 09/25/2021 1118   CO2 27 09/25/2021 1118   GLUCOSE 96 09/25/2021 1118   BUN 8 09/25/2021 1118   CREATININE 0.69 09/25/2021 1118   CREATININE 0.74 09/30/2019 1047    CREATININE 0.75 07/06/2016 1534   CALCIUM 8.9 09/28/2021 1052   PROT 6.2 09/25/2021 1118   ALBUMIN 3.8 09/25/2021 1118   AST 37 09/25/2021 1118   AST 27 09/30/2019 1047   ALT 32 09/25/2021 1118   ALT 14 09/30/2019 1047   ALKPHOS 82 09/25/2021 1118   BILITOT 0.7 09/25/2021 1118   BILITOT 0.4 09/30/2019 1047   GFRNONAA >60 06/28/2020 0703   GFRNONAA >60 09/30/2019 1047   GFRAA >60 09/30/2019 1047   Lab Results  Component Value Date   CHOL 150 09/03/2019   HDL 39 (L) 09/03/2019   LDLCALC 89 09/03/2019   TRIG 120 09/03/2019   CHOLHDL 4 07/21/2019   Lab Results  Component Value Date   HGBA1C 4.9 09/03/2019   Lab Results  Component Value Date   VITAMINB12 473 09/03/2019  Lab Results  Component Value Date   TSH 1.96 05/24/2021      ASSESSMENT AND PLAN 31 y.o. year old female  has a past medical history of Acid reflux, ADD (attention deficit disorder), Anemia, Anxiety, Arthritis, Back pain, Chest pain, Complication of anesthesia, Depression, Dyspnea, Exercise-induced asthma, Fatty liver, History of blood transfusion, History of chicken pox, History of kidney stones, Hypothyroidism, Joint pain, Lower extremity edema, Major depressive disorder, Migraines, Palpitations, Pneumonia, PONV (postoperative nausea and vomiting), RLS (restless legs syndrome), Sleep apnea (2021), and Thyroid disease. here with:  1.  Migraine headaches  Continue Topamax 100 mg BID  2.  Morning headaches 3.  Obstructive sleep apnea on CPAP   Home sleep test ordered Would like to switch DME company to [facility]   Follow-up after [place], MSN, NP-C 11/02/2021, 2:02 PM [facility] [address] [place],  [id number] [phone]

## 2021-11-07 DIAGNOSIS — F4323 Adjustment disorder with mixed anxiety and depressed mood: Secondary | ICD-10-CM | POA: Diagnosis not present

## 2021-11-08 ENCOUNTER — Encounter (INDEPENDENT_AMBULATORY_CARE_PROVIDER_SITE_OTHER): Payer: Self-pay

## 2021-11-14 DIAGNOSIS — F4323 Adjustment disorder with mixed anxiety and depressed mood: Secondary | ICD-10-CM | POA: Diagnosis not present

## 2021-11-16 NOTE — Progress Notes (Signed)
31 y.o. G0P0000 Single White or Caucasian Not Hispanic or Latino female here for annual exam.   H/O hysteroscopy, D&C and mirena IUD insertion in 12/21 for AUB. Pathology with benign endometrial polyp. Just occasional spotting, no regular cycles. Not sexually active.  No bowel or bladder c/o.  She has sarcoidosis. Low energy, fatigue, gets dizzy, brain fog and SOB. Not working since 2021. Trying to get disabiltiy.     No LMP recorded. (Menstrual status: IUD).          Sexually active: No.  The current method of family planning is IUD.    Exercising: No.  The patient does not participate in regular exercise at present. Smoker:  no  Health Maintenance: Pap:  05/21/19 WNL HR HPV Neg  History of abnormal Pap:  no MMG:  none  BMD:   none  Colonoscopy: none  TDaP:  02/02/15 Gardasil: not sure    reports that she has never smoked. She has never been exposed to tobacco smoke. She has never used smokeless tobacco. She reports that she does not currently use alcohol. She reports that she does not use drugs. Lives on her own. Has support locally.   Past Medical History:  Diagnosis Date   Acid reflux    ADD (attention deficit disorder)    Anemia    Anxiety    Arthritis    left ankle   Back pain    Chest pain    Complication of anesthesia    Depression    Dyspnea    with exertion   Exercise-induced asthma    as a child   Fatty liver    History of blood transfusion    History of chicken pox    History of kidney stones    passed    Hypothyroidism    Joint pain    Lower extremity edema    Major depressive disorder    Migraines    chronic   Palpitations    Pneumonia    x 2   PONV (postoperative nausea and vomiting)    nausea   RLS (restless legs syndrome)    Sleep apnea 2021   Thyroid disease    hypothyroid    Past Surgical History:  Procedure Laterality Date   BRONCHIAL NEEDLE ASPIRATION BIOPSY  03/31/2020   Procedure: BRONCHIAL NEEDLE ASPIRATION BIOPSIES;  Surgeon:  Josephine Igo, DO;  Location: [facility];  Service: Pulmonary;;   BRONCHIAL WASHINGS  03/31/2020   Procedure: BRONCHIAL WASHINGS;  Surgeon: Josephine Igo, DO;  Location: [facility];  Service: Pulmonary;;   DILATATION & CURETTAGE/HYSTEROSCOPY WITH MYOSURE N/A 03/22/2020   Procedure: DILATATION & CURETTAGE/HYSTEROSCOPY WITH MYOSURE;  Surgeon: Romualdo Bolk, MD;  [facility];  Service: Gynecology;  Laterality: N/A;   INTERCOSTAL NERVE BLOCK Right 06/27/2020   Procedure: INTERCOSTAL NERVE BLOCK;  Surgeon: Corliss Skains, MD;  Location: [facility] OR;  Service: Thoracic;  Laterality: Right;   INTRAUTERINE DEVICE (IUD) INSERTION N/A 03/22/2020   Procedure: INTRAUTERINE DEVICE (IUD) INSERTION;  Surgeon: Romualdo Bolk, MD;  Location: [facility] OR;  Service: Gynecology;  Laterality: N/A;  Mirena IUD insertion   KNEE ARTHROSCOPY Right 2007   LYMPH NODE BIOPSY Right 06/27/2020   Procedure: LYMPH NODE BIOPSY;  Surgeon: Corliss Skains, MD;  Location: [facility] OR;  Service: Thoracic;  Laterality: Right;   TONSILLECTOMY  2010   TONSILLECTOMY     VIDEO BRONCHOSCOPY WITH ENDOBRONCHIAL ULTRASOUND N/A 03/31/2020   Procedure: VIDEO BRONCHOSCOPY WITH ENDOBRONCHIAL ULTRASOUND;  Surgeon: Josephine Igo, DO;  Location: [facility];  Service: Pulmonary;  Laterality: N/A;   WISDOM TOOTH EXTRACTION     WISDOM TOOTH EXTRACTION Bilateral    upper and lower    Current Outpatient Medications  Medication Sig Dispense Refill   albuterol (VENTOLIN HFA) 108 (90 Base) MCG/ACT inhaler Inhale 2 puffs into the lungs every 6 (six) hours as needed for wheezing or shortness of breath. 8 g 2   aspirin-acetaminophen-caffeine (EXCEDRIN MIGRAINE) 250-250-65 MG tablet Take 2 tablets by mouth daily as needed for headache or migraine.     diphenhydrAMINE HCl, Sleep, (ZZZQUIL PO) Take by mouth at bedtime as needed.     folic acid (FOLVITE) 1 MG tablet Take 1 tablet (1 mg total) by mouth daily. 60 tablet 6   furosemide  (LASIX) 20 MG tablet TAKE 1 TABLET BY MOUTH TWO TIMES A WEEK 26 tablet 5   gabapentin (NEURONTIN) 300 MG capsule Take 1 capsule (300 mg total) by mouth at bedtime. 90 capsule 1   Iron-FA-B Cmp-C-Biot-Probiotic (FUSION PLUS) CAPS One capsule twice daily with orange juice. Stop your Ferrous sulfate. 30 capsule 1   levonorgestrel (MIRENA, 52 MG,) 20 MCG/DAY IUD 1 each by Intrauterine route once.     levothyroxine (SYNTHROID) 100 MCG tablet Take 1 tablet (100 mcg total) by mouth daily. 90 tablet 1   levothyroxine (SYNTHROID) 137 MCG tablet Take 1 tablet (137 mcg total) by mouth daily before breakfast. 90 tablet 1   [START ON 11/24/2021] LORazepam (ATIVAN) 0.5 MG tablet Take 1 tablet (0.5 mg total) by mouth every 8 (eight) hours as needed for anxiety. 30 tablet 1   lurasidone (LATUDA) 40 MG TABS tablet Take 1/2 tablet daily with supper for one week, then increase to 1 tablet daily 90 tablet 0   methocarbamol (ROBAXIN) 500 MG tablet Take 1 tablet (500 mg total) by mouth every 8 (eight) hours as needed for muscle spasms. 20 tablet 0   methotrexate (RHEUMATREX) 2.5 MG tablet TAKE 6 TABLETS BY MOUTH ONCE WEEKLY *PROTECT FROM LIGHT* 24 tablet 5   Multiple Vitamins-Minerals (MULTIVITAMIN WITH MINERALS) tablet Take 1 tablet by mouth daily.     nystatin cream (MYCOSTATIN) Apply 1 application topically 2 (two) times daily. Apply to affected area BID for up to 7 days. 30 g 1   omeprazole (PRILOSEC) 20 MG capsule Take 2 capsules (40 mg total) by mouth daily. 180 capsule 1   potassium chloride SA (KLOR-CON) 20 MEQ tablet Take 1 tablet (20 mEq total) by mouth 2 (two) times a week. 26 tablet 0   propranolol (INDERAL) 20 MG tablet Take 1 tablet (20 mg total) by mouth 3 (three) times daily. (Patient taking differently: Take 20 mg by mouth 2 (two) times daily.) 270 tablet 3   sertraline (ZOLOFT) 100 MG tablet Take 2 tablets (200 mg total) by mouth daily. 180 tablet 0   topiramate (TOPAMAX) 50 MG tablet Start with 50mg (1  tab) at bedtime. In 1 week increase to 100mg (2 tabs) at bedtime. 180 tablet 6   Vitamin D, Ergocalciferol, (DRISDOL) 1.25 MG (50000 UNIT) CAPS capsule Take 1 capsule (50,000 Units total) by mouth 2 (two) times a week. 12 capsule 0   Semaglutide-Weight Management (WEGOVY) 0.25 MG/0.5ML SOAJ Inject 0.25 mg into the skin once a week. (Patient not taking: Reported on 10/11/2021) 2 mL 0   No current facility-administered medications for this visit.    Family History  Problem Relation Age of Onset   Depression Mother  Obesity Mother        died from "an infection" ? MRSA   Depression Father    Sleep apnea Father    Obesity Father    Arthritis Maternal Grandmother    Breast cancer Maternal Grandmother        in remission   Diabetes Maternal Grandfather    Arthritis Maternal Grandfather    Arthritis Paternal Grandmother    Arthritis Paternal Grandfather    Cancer Paternal Aunt        colon   Cancer Paternal Uncle        brain cancer   Migraines Neg Hx     Review of Systems  All other systems reviewed and are negative.   Exam:   BP 132/70   Pulse 86   Ht 5\' 7"  (1.702 m)   Wt (!) 386 lb (175.1 kg)   SpO2 99%   BMI 60.46 kg/m   Weight change: @WEIGHTCHANGE @ Height:   Height: 5\' 7"  (170.2 cm)  Ht Readings from Last 3 Encounters:  11/23/21 5\' 7"  (1.702 m)  11/02/21 5\' 7"  (1.702 m)  10/11/21 5\' 7"  (1.702 m)    General appearance: alert, cooperative and appears stated age Head: Normocephalic, without obvious abnormality, atraumatic Neck: no adenopathy, supple, symmetrical, trachea midline and thyroid normal to inspection and palpation Lungs: clear to auscultation bilaterally Cardiovascular: regular rate and rhythm Breasts: normal appearance, no masses or tenderness Abdomen: soft, non-tender; non distended,  no masses,  no organomegaly Extremities: extremities normal, atraumatic, no cyanosis or edema Skin: erythematous, patchy rash under her right breast and under her  panus Lymph nodes: Cervical, supraclavicular, and axillary nodes normal. No abnormal inguinal nodes palpated Neurologic: Grossly normal   Pelvic: External genitalia:  no lesions              Urethra:  normal appearing urethra with no masses, tenderness or lesions              Bartholins and Skenes: normal                 Vagina: normal appearing vagina with normal color and discharge, no lesions              Cervix: no lesions, IUD strings 3-4 cm               Bimanual Exam:  Uterus:   no masses or tenderness              Adnexa: no mass, fullness, tenderness               Rectovaginal: Confirms               Anus:  normal sphincter tone, no lesions  11/25/21, CMA chaperoned for the exam.  1. Well woman exam No pap this year Labs with primary Discussed breast self exam Discussed calcium and vit D intake  2. IUD check up Doing well  3. Candidal intertrigo - nystatin cream (MYCOSTATIN); Apply 1 Application topically 2 (two) times daily. Apply to affected area BID for up to 7 days.  Dispense: 30 g; Refill: 1

## 2021-11-20 DIAGNOSIS — F4323 Adjustment disorder with mixed anxiety and depressed mood: Secondary | ICD-10-CM | POA: Diagnosis not present

## 2021-11-23 ENCOUNTER — Telehealth: Payer: Self-pay | Admitting: Adult Health

## 2021-11-23 ENCOUNTER — Encounter: Payer: Self-pay | Admitting: Obstetrics and Gynecology

## 2021-11-23 ENCOUNTER — Ambulatory Visit (INDEPENDENT_AMBULATORY_CARE_PROVIDER_SITE_OTHER): Payer: BC Managed Care – PPO | Admitting: Obstetrics and Gynecology

## 2021-11-23 VITALS — BP 132/70 | HR 86 | Ht 67.0 in | Wt 386.0 lb

## 2021-11-23 DIAGNOSIS — Z30431 Encounter for routine checking of intrauterine contraceptive device: Secondary | ICD-10-CM

## 2021-11-23 DIAGNOSIS — Z01419 Encounter for gynecological examination (general) (routine) without abnormal findings: Secondary | ICD-10-CM

## 2021-11-23 DIAGNOSIS — B372 Candidiasis of skin and nail: Secondary | ICD-10-CM

## 2021-11-23 MED ORDER — NYSTATIN 100000 UNIT/GM EX CREA: 1.0000 | TOPICAL_CREAM | Freq: Two times a day (BID) | CUTANEOUS | 1 refills | Status: DC

## 2021-11-23 NOTE — Telephone Encounter (Signed)
[employer] Berkley Harvey: [id number] (exp. 11/15/21 to 01/13/22)   left VM to schedule HST 11/23/21 [place]

## 2021-11-23 NOTE — Patient Instructions (Signed)

## 2021-11-28 DIAGNOSIS — F4323 Adjustment disorder with mixed anxiety and depressed mood: Secondary | ICD-10-CM | POA: Diagnosis not present

## 2021-11-29 ENCOUNTER — Other Ambulatory Visit: Payer: Self-pay | Admitting: Family

## 2021-11-29 DIAGNOSIS — E039 Hypothyroidism, unspecified: Secondary | ICD-10-CM

## 2021-12-18 DIAGNOSIS — F4323 Adjustment disorder with mixed anxiety and depressed mood: Secondary | ICD-10-CM | POA: Diagnosis not present

## 2021-12-18 NOTE — Telephone Encounter (Signed)
LVM for pt to call back to schedule.

## 2021-12-26 DIAGNOSIS — F4323 Adjustment disorder with mixed anxiety and depressed mood: Secondary | ICD-10-CM | POA: Diagnosis not present

## 2021-12-28 ENCOUNTER — Encounter: Payer: Self-pay | Admitting: Psychiatry

## 2021-12-28 ENCOUNTER — Ambulatory Visit (INDEPENDENT_AMBULATORY_CARE_PROVIDER_SITE_OTHER): Payer: BC Managed Care – PPO | Admitting: Psychiatry

## 2021-12-28 DIAGNOSIS — F339 Major depressive disorder, recurrent, unspecified: Secondary | ICD-10-CM | POA: Diagnosis not present

## 2021-12-28 DIAGNOSIS — G4700 Insomnia, unspecified: Secondary | ICD-10-CM | POA: Diagnosis not present

## 2021-12-28 DIAGNOSIS — F900 Attention-deficit hyperactivity disorder, predominantly inattentive type: Secondary | ICD-10-CM

## 2021-12-28 DIAGNOSIS — F411 Generalized anxiety disorder: Secondary | ICD-10-CM

## 2021-12-28 NOTE — Progress Notes (Signed)
[name] December 25, 1990 31 y.o.  Subjective:   Patient ID:  Margaret Golden is a 31 y.o. (DOB [birth year]) female.  Chief Complaint:  Chief Complaint  Patient presents with   Depression   Anxiety    HPI [name] presents to the office today for follow-up of anxiety, depression, and ADHD.   She reports that the past couple of weeks have been stressful. Family credit card account was compromised. She reports severe financial stressors. Reports some anxiety attacks. Increased  worry.   Has had worsening depression. Reports falling asleep after 5 am and awakens around 12-1:30 pm and does not immediately get out of bed. Has been having periods of "starvation alternating with binging." Energy and motivation have been low. She reports difficulty with concentration and focus.   She reports "passive suicidal ideations." She reports that she has had a few occasions of SI and that her pets have prevented suicidal intent. Denies SI today. She contracts for safety and agrees to call her friend if she feels she is not safe.   She reports that she is not taking medications consistently for the last week other than methotrexate. She reports that she forgot to set up her pill box for the week.   Reports that she has had a migraine for 2 days and headaches more often.   Father is about to go to [place]. Sees therapist most weeks and will see therapist next week.  Past medication trials: Wellbutrin- initially helpful and then was less helpful Lexapro-ineffective Cymbalta Pristiq Seroquel- does not recall any significant improvement Latuda Abilify Propranolol- prescribed for HR. Not helpful for anxiety Melatonin Vyvanse Adderall XR- not effective Concerta- Notices duration only lasts about 4 hours. No significant improvement with doses less than 54 mg.  Theadora Rama- May have caused n/v and poor concentration Horizant Lamictal-effective for mood Trazodone-effective for  insomnia but causes some excessive drowsiness Rexulti- May have caused n/v and poor concentration Ativan- helpful Armodafinil- jaw locked    [place] Office Visit from 09/28/2021 in [place] Total Score 6      GAD-7    [place] Office Visit from 09/25/2021 in [facility] at [place] Visit from 11/16/2020 in [facility] at [facility]  Total GAD-7 Score 17 19      PHQ2-9    [place] Visit from 09/25/2021 in [place] at [place] Visit from 11/16/2020 in [place] at [place] Visit from 09/03/2019 in [place] Nutrition from 02/10/2019 in [facility] Visit from 05/02/2018 in [place] at [place]  PHQ-2 Total Score 6 6 3 2 4   PHQ-9 Total Score 20 20 14  -- [facility] from 09/20/2021 in [place] Admission (Discharged) from 06/27/2020 in [facility] 4E CV SURGICAL PROGRESSIVE CARE Pre-Admission Testing 60 from 06/23/2020 in [facility] PREADMISSION TESTING  C-SSRS RISK CATEGORY No Risk No Risk No Risk        Review of Systems:  Review of Systems  Respiratory:  Positive for shortness of breath.   Musculoskeletal:  Negative for gait problem.  Neurological:  Positive for headaches.  Psychiatric/Behavioral:         Please refer to HPI   She reports poor spatial awareness and will bump  into things and drop things more on certain days.  Medications: I have reviewed the patient's current medications.  Current Outpatient Medications  Medication Sig Dispense Refill   albuterol (VENTOLIN HFA) 108 (90 Base) MCG/ACT inhaler Inhale 2 puffs into the lungs every 6 (six) hours as needed for wheezing or shortness of breath. 8 g 2   aspirin-acetaminophen-caffeine  (EXCEDRIN MIGRAINE) 250-250-65 MG tablet Take 2 tablets by mouth daily as needed for headache or migraine.     folic acid (FOLVITE) 1 MG tablet Take 1 tablet (1 mg total) by mouth daily. 60 tablet 6   furosemide (LASIX) 20 MG tablet TAKE 1 TABLET BY MOUTH TWO TIMES A WEEK 26 tablet 5   gabapentin (NEURONTIN) 300 MG capsule Take 1 capsule (300 mg total) by mouth at bedtime. 90 capsule 1   Iron-FA-B Cmp-C-Biot-Probiotic (FUSION PLUS) CAPS One capsule twice daily with orange juice. Stop your Ferrous sulfate. 30 capsule 1   levonorgestrel (MIRENA, 52 MG,) 20 MCG/DAY IUD 1 each by Intrauterine route once.     levothyroxine (SYNTHROID) 100 MCG tablet TAKE ONE TABLET BY MOUTH DAILY WITH 137MCG TABLET 30 tablet 0   levothyroxine (SYNTHROID) 137 MCG tablet TAKE ONE TABLET BY MOUTH DAILY BEFORE BREAKFAST 30 tablet 0   LORazepam (ATIVAN) 0.5 MG tablet Take 1 tablet (0.5 mg total) by mouth every 8 (eight) hours as needed for anxiety. 30 tablet 1   lurasidone (LATUDA) 40 MG TABS tablet Take 1/2 tablet daily with supper for one week, then increase to 1 tablet daily 90 tablet 0   methotrexate (RHEUMATREX) 2.5 MG tablet TAKE 6 TABLETS BY MOUTH ONCE WEEKLY *PROTECT FROM LIGHT* 24 tablet 5   Multiple Vitamins-Minerals (MULTIVITAMIN WITH MINERALS) tablet Take 1 tablet by mouth daily.     omeprazole (PRILOSEC) 20 MG capsule Take 2 capsules (40 mg total) by mouth daily. 60 capsule 0   potassium chloride SA (KLOR-CON) 20 MEQ tablet Take 1 tablet (20 mEq total) by mouth 2 (two) times a week. 26 tablet 0   propranolol (INDERAL) 20 MG tablet Take 1 tablet (20 mg total) by mouth 3 (three) times daily. (Patient taking differently: Take 20 mg by mouth 2 (two) times daily.) 270 tablet 3   sertraline (ZOLOFT) 100 MG tablet Take 2 tablets (200 mg total) by mouth daily. 180 tablet 0   topiramate (TOPAMAX) 50 MG tablet Start with 50mg (1 tab) at bedtime. In 1 week increase to 100mg (2 tabs) at bedtime. 180 tablet 6   Vitamin D,  Ergocalciferol, (DRISDOL) 1.25 MG (50000 UNIT) CAPS capsule Take 1 capsule (50,000 Units total) by mouth 2 (two) times a week. 12 capsule 0   diphenhydrAMINE HCl, Sleep, (ZZZQUIL PO) Take by mouth at bedtime as needed. (Patient not taking: Reported on 12/28/2021)     methocarbamol (ROBAXIN) 500 MG tablet Take 1 tablet (500 mg total) by mouth every 8 (eight) hours as needed for muscle spasms. (Patient not taking: Reported on 12/28/2021) 20 tablet 0   nystatin cream (MYCOSTATIN) Apply 1 Application topically 2 (two) times daily. Apply to affected area BID for up to 7 days. (Patient not taking: Reported on 12/28/2021) 30 g 1   Semaglutide-Weight Management (WEGOVY) 0.25 MG/0.5ML SOAJ Inject 0.25 mg into the skin once a week. (Patient not taking: Reported on 10/11/2021) 2 mL 0   No current facility-administered medications for this visit.    Medication Side Effects: None  Allergies:  Allergies  Allergen Reactions   Augmentin [Amoxicillin-Pot Clavulanate] Hives    Past  Medical History:  Diagnosis Date   Acid reflux    ADD (attention deficit disorder)    Anemia    Anxiety    Arthritis    left ankle   Back pain    Chest pain    Complication of anesthesia    Depression    Dyspnea    with exertion   Exercise-induced asthma    as a child   Fatty liver    History of blood transfusion    History of chicken pox    History of kidney stones    passed    Hypothyroidism    Joint pain    Lower extremity edema    Major depressive disorder    Migraines    chronic   Palpitations    Pneumonia    x 2   PONV (postoperative nausea and vomiting)    nausea   RLS (restless legs syndrome)    Sleep apnea 2021   Thyroid disease    hypothyroid    Past Medical History, Surgical history, Social history, and Family history were reviewed and updated as appropriate.   Please see review of systems for further details on the patient's review from today.   Objective:   Physical Exam:  There were no  vitals taken for this visit.  Physical Exam Constitutional:      General: She is not in acute distress. Musculoskeletal:        General: No deformity.  Neurological:     Mental Status: She is alert and oriented to person, place, and time.     Coordination: Coordination normal.  Psychiatric:        Attention and Perception: Attention and perception normal. She does not perceive auditory or visual hallucinations.        Mood and Affect: Mood is anxious and depressed. Affect is not labile, blunt, angry or inappropriate.        Speech: Speech normal.        Behavior: Behavior normal.        Thought Content: Thought content normal. Thought content is not paranoid or delusional. Thought content does not include homicidal or suicidal ideation. Thought content does not include homicidal or suicidal plan.        Cognition and Memory: Cognition and memory normal.        Judgment: Judgment normal.     Comments: Insight intact     Lab Review:     Component Value Date/Time   NA 141 09/25/2021 1118   K 4.1 09/25/2021 1118   CL 105 09/25/2021 1118   CO2 27 09/25/2021 1118   GLUCOSE 96 09/25/2021 1118   BUN 8 09/25/2021 1118   CREATININE 0.69 09/25/2021 1118   CREATININE 0.74 09/30/2019 1047   CREATININE 0.75 07/06/2016 1534   CALCIUM 8.9 09/28/2021 1052   PROT 6.2 09/25/2021 1118   ALBUMIN 3.8 09/25/2021 1118   AST 37 09/25/2021 1118   AST 27 09/30/2019 1047   ALT 32 09/25/2021 1118   ALT 14 09/30/2019 1047   ALKPHOS 82 09/25/2021 1118   BILITOT 0.7 09/25/2021 1118   BILITOT 0.4 09/30/2019 1047   GFRNONAA >60 06/28/2020 0703   GFRNONAA >60 09/30/2019 1047   GFRAA >60 09/30/2019 1047       Component Value Date/Time   WBC 9.0 09/25/2021 1118   RBC 4.48 09/25/2021 1118   HGB 13.5 09/25/2021 1118   HGB 13.1 08/14/2021 1431   HGB 9.3 (L) 08/26/2019 1045   HCT 40.3  09/25/2021 1118   HCT 33.4 (L) 08/26/2019 1045   PLT 156.0 09/25/2021 1118   PLT 131 (L) 08/14/2021 1431   PLT 234  08/26/2019 1045   MCV 90.0 09/25/2021 1118   MCV 72 (L) 08/26/2019 1045   MCH 30.1 08/14/2021 1431   MCHC 33.4 09/25/2021 1118   RDW 15.4 09/25/2021 1118   RDW 15.4 08/26/2019 1045   LYMPHSABS 2.4 09/25/2021 1118   MONOABS 0.5 09/25/2021 1118   EOSABS 0.2 09/25/2021 1118   BASOSABS 0.0 09/25/2021 1118    No results found for: "POCLITH", "LITHIUM"   No results found for: "PHENYTOIN", "PHENOBARB", "VALPROATE", "CBMZ"   .res Assessment: Plan:    Pt seen for 30 minutes and time spent discussing safety plan and pt agrees that she will contact her friend if she experiences suicidal intent and/or suicidal thoughts. Discussed that suicidal thoughts seem to occur after she has missed doses of medication and depression worsens, which leads to lower motivation and difficulty getting back on track with taking medications consistently. Discussed strategies to help avoid lapses in medication, such as asking friend to body double/ assist her with setting up weekly pill boxes every 1-2 weeks since Ryn reports that body doubling has been helpful in other areas and that she is more likely to take medication when she has prepared meds in her pill box.  She reports that she would like to re-start current medications since she has noticed meds are effective for mood and anxiety symptoms in the past and "I don't see where adding a new medication would help." She also reports some improvement in mood and anxiety after therapist was able to assist her with some acute stressors.  Discussed that she could re-start Latuda 40 mg 1/2 tab at bedtime for 6 days, then increase to 1 tab at bedtime for mood s/s.  Will re-start Sertraline for anxiety and depression.  Will also re-start Topamax. Discussed that Topamax can help prevent migraines and she reports that lapse in Topamax may have contributed to current 2-day migraine. Continue Ativan 0.5 mg prn anxiety.  Recommend continuing psychotherapy.  Pt to follow-up with  this provider in 4 weeks or sooner if clinically indicated.  Patient advised to contact office with any questions, adverse effects, or acute worsening in signs and symptoms.   Margaret Golden was seen today for depression and anxiety.  Diagnoses and all orders for this visit:  Major depression, recurrent, chronic ([place])  Generalized anxiety disorder  Attention deficit hyperactivity disorder (ADHD), predominantly inattentive type  Insomnia, unspecified type     Please see After Visit Summary for patient specific instructions.  Future Appointments  Date Time Provider [place]  01/25/2022  2:45 PM Thayer Headings, PMHNP [facility] None  02/14/2022  1:00 PM Celso Amy, NP [facility] None    No orders of the defined types were placed in this encounter.   -------------------------------

## 2021-12-29 ENCOUNTER — Other Ambulatory Visit: Payer: Self-pay | Admitting: Pulmonary Disease

## 2021-12-29 DIAGNOSIS — D869 Sarcoidosis, unspecified: Secondary | ICD-10-CM

## 2021-12-31 ENCOUNTER — Other Ambulatory Visit: Payer: Self-pay | Admitting: Family

## 2021-12-31 DIAGNOSIS — E039 Hypothyroidism, unspecified: Secondary | ICD-10-CM

## 2022-01-01 DIAGNOSIS — F4323 Adjustment disorder with mixed anxiety and depressed mood: Secondary | ICD-10-CM | POA: Diagnosis not present

## 2022-01-16 DIAGNOSIS — F4323 Adjustment disorder with mixed anxiety and depressed mood: Secondary | ICD-10-CM | POA: Diagnosis not present

## 2022-01-22 DIAGNOSIS — F4323 Adjustment disorder with mixed anxiety and depressed mood: Secondary | ICD-10-CM | POA: Diagnosis not present

## 2022-01-25 ENCOUNTER — Encounter: Payer: Self-pay | Admitting: Psychiatry

## 2022-01-25 ENCOUNTER — Ambulatory Visit (INDEPENDENT_AMBULATORY_CARE_PROVIDER_SITE_OTHER): Payer: BC Managed Care – PPO | Admitting: Psychiatry

## 2022-01-25 VITALS — BP 121/89 | HR 79

## 2022-01-25 DIAGNOSIS — F339 Major depressive disorder, recurrent, unspecified: Secondary | ICD-10-CM

## 2022-01-25 DIAGNOSIS — F900 Attention-deficit hyperactivity disorder, predominantly inattentive type: Secondary | ICD-10-CM

## 2022-01-25 DIAGNOSIS — F4010 Social phobia, unspecified: Secondary | ICD-10-CM | POA: Diagnosis not present

## 2022-01-25 MED ORDER — METHYLPHENIDATE HCL 10 MG PO TABS: ORAL_TABLET | ORAL | 0 refills | Status: DC

## 2022-01-25 NOTE — Progress Notes (Signed)
Margaret Golden [id number] [birth year] 31 y.o.  Subjective:   Patient ID:  Margaret Golden is a 31 y.o. (DOB Dec 16, 1990) female.  Chief Complaint:  Chief Complaint  Patient presents with   Follow-up    Anxiety, depression, and ADHD    HPI [name] presents to the office today for follow-up of anxiety, depression, and ADHD.  She reports that she was able to re-start medications without difficulty. She reports that she had some anxiety with submitting paperwork for SSI and waiting on her father to share some information that was needed. She has had increased anxiety and some worry. Denies any recent panic attacks.   She notices some depression with this time of year. She reports that there are a few things she is looking forward to. She has gotten involved again with a card game group. Reports energy and motivation have been "on the low end." Reports motivation "comes and goes, but not at rock bottom where it was before." She reports that she is having more physical aches with colder weather and this affects her motivation. She reports adequate sleep if she takes Zzzquil before midnight and otherwise she will stay awake until 5 am. Appetite fluctuates. Concentration is ok when she is focused on something she enjoys. Denies SI.   Father's trip to [place] was cancelled. She reports that father put in application for [place]. Father has told her he will not be coming for the holidays.   She reports that she has not needed Lorazepam prn recently.   Lorazepam last filled 10/27/21 x 2.  Past medication trials: Wellbutrin- initially helpful and then was less helpful Lexapro-ineffective Cymbalta Pristiq Seroquel- does not recall any significant improvement Latuda Abilify Propranolol- prescribed for HR. Not helpful for anxiety Melatonin Vyvanse Adderall XR- not effective Concerta- Notices duration only lasts about 4 hours. No significant improvement with doses less than 54 mg.   [name]- May have caused n/v and poor concentration Horizant Lamictal-effective for mood Trazodone-effective for insomnia but causes some excessive drowsiness Rexulti- May have caused n/v and poor concentration Ativan- helpful Armodafinil- jaw locked    AIMS    Flowsheet Row Office Visit from 09/28/2021 in [facility]  AIMS Total Score 6      GAD-7    Flowsheet Row Office Visit from 09/25/2021 in [facility] at [facility] Office Visit from 11/16/2020 in [facility]  Total GAD-7 Score 17 19      PHQ2-9    Flowsheet Row Office Visit from 09/25/2021 in [facility] Office Visit from 11/16/2020 in [facility] at [facility] Office Visit from 09/03/2019 in [facility] Nutrition from 02/10/2019 [facility] Office Visit from 05/02/2018 in [facility]  PHQ-2 Total Score 6 6 3 2 4   PHQ-9 Total Score 20 20 14  -- 15      Flowsheet Row ED from 09/20/2021 in [facility] EM[facility]Y DEPARTMENT Admission (Discharged) from 06/27/2020 in [facility] 4E CV SURGICAL PROGRESSIVE CARE Pre-Admission Testing 60 from 06/23/2020 in [facility] PREADMISSION TESTING  C-SSRS RISK CATEGORY No Risk No Risk No Risk        Review of Systems:  Review of Systems  Musculoskeletal:  Positive for arthralgias, myalgias, neck pain and neck stiffness. Negative for gait problem.  Neurological:  Negative for tremors.  Psychiatric/Behavioral:  Please refer to HPI    Medications: I have reviewed the patient's current medications.  Current Outpatient Medications  Medication Sig Dispense Refill   albuterol (VENTOLIN HFA) 108 (90 Base) MCG/ACT inhaler Inhale 2 puffs into the lungs every 6 (six) hours as needed for wheezing or shortness of breath. 8 g 2    aspirin-acetaminophen-caffeine (EXCEDRIN MIGRAINE) 250-250-65 MG tablet Take 2 tablets by mouth daily as needed for headache or migraine.     diphenhydrAMINE HCl, Sleep, (ZZZQUIL PO) Take by mouth at bedtime as needed.     folic acid (FOLVITE) 1 MG tablet Take 1 tablet (1 mg total) by mouth daily. 60 tablet 6   furosemide (LASIX) 20 MG tablet TAKE 1 TABLET BY MOUTH TWO TIMES A WEEK 26 tablet 5   gabapentin (NEURONTIN) 300 MG capsule Take 1 capsule (300 mg total) by mouth at bedtime. 90 capsule 1   Iron-FA-B Cmp-C-Biot-Probiotic (FUSION PLUS) CAPS One capsule twice daily with orange juice. Stop your Ferrous sulfate. 30 capsule 1   levonorgestrel (MIRENA, 52 MG,) 20 MCG/DAY IUD 1 each by Intrauterine route once.     levothyroxine (SYNTHROID) 100 MCG tablet TAKE 1 TABLET BY MOUTH DAILY WITH TABLET 90 tablet 1   levothyroxine (SYNTHROID) 137 MCG tablet TAKE 1 TABLET BY MOUTH DAILY BEFORE BREAKFAST WITH 100 MCG TABLET - PT NEEDS APPOINTMENT 90 tablet 1   LORazepam (ATIVAN) 0.5 MG tablet Take 1 tablet (0.5 mg total) by mouth every 8 (eight) hours as needed for anxiety. 30 tablet 1   lurasidone (LATUDA) 40 MG TABS tablet Take 1/2 tablet daily with supper for one week, then increase to 1 tablet daily 90 tablet 0   methotrexate (RHEUMATREX) 2.5 MG tablet TAKE 6 TABLETS BY MOUTH ONCE WEEKLY PROTECT FROM LIGHT 24 tablet 5   methylphenidate (RITALIN) 10 MG tablet Take 1/2-1 tab po BID prn 60 tablet 0   Multiple Vitamins-Minerals (MULTIVITAMIN WITH MINERALS) tablet Take 1 tablet by mouth daily.     omeprazole (PRILOSEC) 20 MG capsule TAKE 2 CAPSULES BY MOUTH DAILY 180 capsule `   potassium chloride SA (KLOR-CON) 20 MEQ tablet Take 1 tablet (20 mEq total) by mouth 2 (two) times a week. 26 tablet 0   propranolol (INDERAL) 20 MG tablet Take 1 tablet (20 mg total) by mouth 3 (three) times daily. (Patient taking differently: Take 20 mg by mouth 2 (two) times daily.) 270 tablet 3   sertraline (ZOLOFT) 100 MG  tablet Take 2 tablets (200 mg total) by mouth daily. 180 tablet 0   topiramate (TOPAMAX) 50 MG tablet Start with 50mg (1 tab) at bedtime. In 1 week increase to 100mg (2 tabs) at bedtime. 180 tablet 6   Vitamin D, Ergocalciferol, (DRISDOL) 1.25 MG (50000 UNIT) CAPS capsule Take 1 capsule (50,000 Units total) by mouth 2 (two) times a week. 12 capsule 0   methocarbamol (ROBAXIN) 500 MG tablet Take 1 tablet (500 mg total) by mouth every 8 (eight) hours as needed for muscle spasms. (Patient not taking: Reported on 12/28/2021) 20 tablet 0   nystatin cream (MYCOSTATIN) Apply 1 Application topically 2 (two) times daily. Apply to affected area BID for up to 7 days. (Patient not taking: Reported on 12/28/2021) 30 g 1   Semaglutide-Weight Management (WEGOVY) 0.25 MG/0.5ML SOAJ Inject 0.25 mg into the skin once a week. (Patient not taking: Reported on 10/11/2021) 2 mL 0   No current facility-administered medications for this visit.    Medication Side Effects: None  Allergies:  Allergies  Allergen Reactions   Augmentin [Amoxicillin-Pot Clavulanate] Hives    Past Medical History:  Diagnosis Date   Acid reflux    ADD (attention deficit disorder)    Anemia    Anxiety    Arthritis    left ankle   Back pain    Chest pain    Complication of anesthesia    Depression    Dyspnea    with exertion   Exercise-induced asthma    as a child   Fatty liver    History of blood transfusion    History of chicken pox    History of kidney stones    passed    Hypothyroidism    Joint pain    Lower extremity edema    Major depressive disorder    Migraines    chronic   Palpitations    Pneumonia    x 2   PONV (postoperative nausea and vomiting)    nausea   RLS (restless legs syndrome)    Sleep apnea 2021   Thyroid disease    hypothyroid    Past Medical History, Surgical history, Social history, and Family history were reviewed and updated as appropriate.   Please see review of systems for further  details on the patient's review from today.   Objective:   Physical Exam:  BP 121/89   Pulse 79   Physical Exam Constitutional:      General: She is not in acute distress. Musculoskeletal:        General: No deformity.  Neurological:     Mental Status: She is alert and oriented to person, place, and time.     Coordination: Coordination normal.  Psychiatric:        Attention and Perception: Attention and perception normal. She does not perceive auditory or visual hallucinations.        Mood and Affect: Mood normal. Mood is not anxious or depressed. Affect is not labile, blunt, angry or inappropriate.        Speech: Speech normal.        Behavior: Behavior normal.        Thought Content: Thought content normal. Thought content is not paranoid or delusional. Thought content does not include homicidal or suicidal ideation. Thought content does not include homicidal or suicidal plan.        Cognition and Memory: Cognition and memory normal.        Judgment: Judgment normal.     Comments: Insight intact     Lab Review:     Component Value Date/Time   NA 141 09/25/2021 1118   K 4.1 09/25/2021 1118   CL 105 09/25/2021 1118   CO2 27 09/25/2021 1118   GLUCOSE 96 09/25/2021 1118   BUN 8 09/25/2021 1118   CREATININE 0.69 09/25/2021 1118   CREATININE 0.74 09/30/2019 1047   CREATININE 0.75 07/06/2016 1534   CALCIUM 8.9 09/28/2021 1052   PROT 6.2 09/25/2021 1118   ALBUMIN 3.8 09/25/2021 1118   AST 37 09/25/2021 1118   AST 27 09/30/2019 1047   ALT 32 09/25/2021 1118   ALT 14 09/30/2019 1047   ALKPHOS 82 09/25/2021 1118   BILITOT 0.7 09/25/2021 1118   BILITOT 0.4 09/30/2019 1047   GFRNONAA >60 06/28/2020 0703   GFRNONAA >60 09/30/2019 1047   GFRAA >60 09/30/2019 1047       Component Value Date/Time   WBC 9.0 09/25/2021 1118   RBC 4.48 09/25/2021 1118   HGB 13.5 09/25/2021 1118   HGB  13.1 08/14/2021 1431   HGB 9.3 (L) 08/26/2019 1045   HCT 40.3 09/25/2021 1118   HCT 33.4  (L) 08/26/2019 1045   PLT 156.0 09/25/2021 1118   PLT 131 (L) 08/14/2021 1431   PLT 234 08/26/2019 1045   MCV 90.0 09/25/2021 1118   MCV 72 (L) 08/26/2019 1045   MCH 30.1 08/14/2021 1431   MCHC 33.4 09/25/2021 1118   RDW 15.4 09/25/2021 1118   RDW 15.4 08/26/2019 1045   LYMPHSABS 2.4 09/25/2021 1118   MONOABS 0.5 09/25/2021 1118   EOSABS 0.2 09/25/2021 1118   BASOSABS 0.0 09/25/2021 1118    No results found for: "POCLITH", "LITHIUM"   No results found for: "PHENYTOIN", "PHENOBARB", "VALPROATE", "CBMZ"   .res Assessment: Plan:   Pt seen for 30 minutes and time spent discussing response to resuming medications, insomnia, and ADHD s/s. She reports that she would like to resume treatment for ADHD to be taken as needed when attempting tasks that require sustained concentration and focus, such as paper work, cleaning, and organizing. Will start trial of Ritalin 10 mg 1/2-1 tab po BID prn.  Discussed trying sleep aid for several consecutive nights to help reset sleep-wake cycle and then resume using sleep med as needed. Continue Gabapentin 300 mg po QHS for RLS, insomnia, and anxiety.  Continue Sertraline 200 mg daily for anxiety and depression.  Continue Latuda 40 mg po qd for mood symptoms. Continue Ativan 0.5 mg po q 8 hours prn anxiety.  Recommend continuing psychotherapy.  Pt to follow-up with this provider in 4 weeks or sooner if clinically indicated.  Patient advised to contact office with any questions, adverse effects, or acute worsening in signs and symptoms.   Kylan was seen today for follow-up.  Diagnoses and all orders for this visit:  Attention deficit hyperactivity disorder (ADHD), predominantly inattentive type -     methylphenidate (RITALIN) 10 MG tablet; Take 1/2-1 tab po BID prn  Social phobia  Major depression, recurrent, chronic (HCC)     Please see After Visit Summary for patient specific instructions.  Future Appointments  Date Time Provider  Department Center  02/14/2022  1:00 PM Erenest Blank, NP [facility] None  02/21/2022  1:45 PM Corie Chiquito, PMHNP [facility] None    No orders of the defined types were placed in this encounter.   -------------------------------

## 2022-01-30 DIAGNOSIS — F4323 Adjustment disorder with mixed anxiety and depressed mood: Secondary | ICD-10-CM | POA: Diagnosis not present

## 2022-02-07 ENCOUNTER — Encounter: Payer: Self-pay | Admitting: Family

## 2022-02-13 ENCOUNTER — Other Ambulatory Visit: Payer: Self-pay | Admitting: Family

## 2022-02-13 DIAGNOSIS — N920 Excessive and frequent menstruation with regular cycle: Secondary | ICD-10-CM

## 2022-02-13 DIAGNOSIS — D500 Iron deficiency anemia secondary to blood loss (chronic): Secondary | ICD-10-CM

## 2022-02-14 ENCOUNTER — Encounter: Payer: Self-pay | Admitting: Family

## 2022-02-14 ENCOUNTER — Inpatient Hospital Stay: Payer: Self-pay | Attending: Family | Admitting: Family

## 2022-02-14 ENCOUNTER — Inpatient Hospital Stay (HOSPITAL_BASED_OUTPATIENT_CLINIC_OR_DEPARTMENT_OTHER): Payer: Self-pay | Admitting: Family

## 2022-02-14 VITALS — BP 116/90 | HR 89 | Temp 98.0°F | Resp 18

## 2022-02-14 DIAGNOSIS — N920 Excessive and frequent menstruation with regular cycle: Secondary | ICD-10-CM

## 2022-02-14 DIAGNOSIS — D500 Iron deficiency anemia secondary to blood loss (chronic): Secondary | ICD-10-CM

## 2022-02-14 DIAGNOSIS — Z7982 Long term (current) use of aspirin: Secondary | ICD-10-CM | POA: Insufficient documentation

## 2022-02-14 DIAGNOSIS — D509 Iron deficiency anemia, unspecified: Secondary | ICD-10-CM | POA: Insufficient documentation

## 2022-02-14 DIAGNOSIS — Z79899 Other long term (current) drug therapy: Secondary | ICD-10-CM | POA: Insufficient documentation

## 2022-02-14 LAB — CBC WITH DIFFERENTIAL (CANCER CENTER ONLY)
Abs Immature Granulocytes: 0.11 10*3/uL — ABNORMAL HIGH (ref 0.00–0.07)
Basophils Absolute: 0 10*3/uL (ref 0.0–0.1)
Basophils Relative: 0 %
Eosinophils Absolute: 0.2 10*3/uL (ref 0.0–0.5)
Eosinophils Relative: 3 %
HCT: 41.6 % (ref 36.0–46.0)
Hemoglobin: 13.4 g/dL (ref 12.0–15.0)
Immature Granulocytes: 2 %
Lymphocytes Relative: 21 %
Lymphs Abs: 1.5 10*3/uL (ref 0.7–4.0)
MCH: 29.6 pg (ref 26.0–34.0)
MCHC: 32.2 g/dL (ref 30.0–36.0)
MCV: 92 fL (ref 80.0–100.0)
Monocytes Absolute: 0.3 10*3/uL (ref 0.1–1.0)
Monocytes Relative: 4 %
Neutro Abs: 5 10*3/uL (ref 1.7–7.7)
Neutrophils Relative %: 70 %
Platelet Count: 128 10*3/uL — ABNORMAL LOW (ref 150–400)
RBC: 4.52 MIL/uL (ref 3.87–5.11)
RDW: 15.1 % (ref 11.5–15.5)
WBC Count: 7.1 10*3/uL (ref 4.0–10.5)
nRBC: 0 % (ref 0.0–0.2)

## 2022-02-14 LAB — RETICULOCYTES
Immature Retic Fract: 22.6 % — ABNORMAL HIGH (ref 2.3–15.9)
RBC.: 4.54 MIL/uL (ref 3.87–5.11)
Retic Count, Absolute: 192 10*3/uL — ABNORMAL HIGH (ref 19.0–186.0)
Retic Ct Pct: 4.2 % — ABNORMAL HIGH (ref 0.4–3.1)

## 2022-02-14 LAB — FERRITIN: Ferritin: 158 ng/mL (ref 11–307)

## 2022-02-14 NOTE — Progress Notes (Signed)
Hematology and Oncology Follow Up Visit  LENIX BENOIST [id number] September 29, 1990 30 y.o. 02/14/2022   Principle Diagnosis:  Iron deficiency anemia secondary to heavy irregular cycles   Current Therapy:        IV iron as indicated    Interim History:  Ms. Stotler is here today for annual follow-up. She is doing well but does note fatigue.  Her cycle is regular and flow remains light with IUD in place.  No other blood loss noted. No bruising or petechiae.  No fever, chills, n/v, cough, rash, dizziness, SOB, chest pain, palpitations, abdominal pain or changes in bowel or bladder habits.  Swelling in her extremities is unchanged from baseline.  Numbness and tingling comes and goes in her upper extremities.  No falls or syncope reported.  Appetite and hydration are good.  ECOG Performance Status: 1 - Symptomatic but completely ambulatory  Medications:  Allergies as of 02/14/2022       Reactions   Augmentin [amoxicillin-pot Clavulanate] Hives        Medication List        Accurate as of February 14, 2022  1:44 PM. If you have any questions, ask your nurse or doctor.          albuterol 108 (90 Base) MCG/ACT inhaler Commonly known as: VENTOLIN HFA Inhale 2 puffs into the lungs every 6 (six) hours as needed for wheezing or shortness of breath.   aspirin-acetaminophen-caffeine 250-250-65 MG tablet Commonly known as: EXCEDRIN MIGRAINE Take 2 tablets by mouth daily as needed for headache or migraine.   folic acid 1 MG tablet Commonly known as: FOLVITE Take 1 tablet (1 mg total) by mouth daily.   furosemide 20 MG tablet Commonly known as: LASIX TAKE 1 TABLET BY MOUTH TWO TIMES A WEEK   Fusion Plus Caps One capsule twice daily with orange juice. Stop your Ferrous sulfate.   gabapentin 300 MG capsule Commonly known as: Neurontin Take 1 capsule (300 mg total) by mouth at bedtime.   levothyroxine 137 MCG tablet Commonly known as: SYNTHROID TAKE 1 TABLET BY MOUTH  DAILY BEFORE BREAKFAST WITH 100 MCG TABLET - PT NEEDS APPOINTMENT   levothyroxine 100 MCG tablet Commonly known as: SYNTHROID TAKE 1 TABLET BY MOUTH DAILY WITH TABLET   LORazepam 0.5 MG tablet Commonly known as: Ativan Take 1 tablet (0.5 mg total) by mouth every 8 (eight) hours as needed for anxiety.   lurasidone 40 MG Tabs tablet Commonly known as: Latuda Take 1/2 tablet daily with supper for one week, then increase to 1 tablet daily   methocarbamol 500 MG tablet Commonly known as: Robaxin Take 1 tablet (500 mg total) by mouth every 8 (eight) hours as needed for muscle spasms.   methotrexate 2.5 MG tablet Commonly known as: RHEUMATREX TAKE 6 TABLETS BY MOUTH ONCE WEEKLY PROTECT FROM LIGHT   methylphenidate 10 MG tablet Commonly known as: RITALIN Take 1/2-1 tab po BID prn   Mirena (52 MG) 20 MCG/DAY Iud Generic drug: levonorgestrel 1 each by Intrauterine route once.   multivitamin with minerals tablet Take 1 tablet by mouth daily.   nystatin cream Commonly known as: MYCOSTATIN Apply 1 Application topically 2 (two) times daily. Apply to affected area BID for up to 7 days.   omeprazole 20 MG capsule Commonly known as: PRILOSEC TAKE 2 CAPSULES BY MOUTH DAILY   potassium chloride SA 20 MEQ tablet Commonly known as: KLOR-CON M Take 1 tablet (20 mEq total) by mouth 2 (two) times a week.  propranolol 20 MG tablet Commonly known as: INDERAL Take 1 tablet (20 mg total) by mouth 3 (three) times daily. What changed: when to take this   sertraline 100 MG tablet Commonly known as: ZOLOFT Take 2 tablets (200 mg total) by mouth daily.   topiramate 50 MG tablet Commonly known as: Topamax Start with 50mg (1 tab) at bedtime. In 1 week increase to 100mg (2 tabs) at bedtime.   Vitamin D (Ergocalciferol) 1.25 MG (50000 UNIT) Caps capsule Commonly known as: DRISDOL Take 1 capsule (50,000 Units total) by mouth 2 (two) times a week.   Wegovy 0.25 MG/0.5ML Soaj Generic  drug: Semaglutide-Weight Management Inject 0.25 mg into the skin once a week.   ZZZQUIL PO Take by mouth at bedtime as needed.        Allergies:  Allergies  Allergen Reactions   Augmentin [Amoxicillin-Pot Clavulanate] Hives    Past Medical History, Surgical history, Social history, and Family History were reviewed and updated.  Review of Systems: All other 10 point review of systems is negative.   Physical Exam:  oral temperature is 98 F (36.7 C). Her blood pressure is 116/90 (abnormal) and her pulse is 89. Her respiration is 18 and oxygen saturation is 100%.   Wt Readings from Last 3 Encounters:  11/23/21 (!) 386 lb (175.1 kg)  11/02/21 (!) 388 lb (176 kg)  10/11/21 (!) 386 lb 12.8 oz (175.5 kg)    Ocular: Sclerae unicteric, pupils equal, round and reactive to light Ear-nose-throat: Oropharynx clear, dentition fair Lymphatic: No cervical or supraclavicular adenopathy Lungs no rales or rhonchi, good excursion bilaterally Heart regular rate and rhythm, no murmur appreciated Abd soft, nontender, positive bowel sounds MSK no focal spinal tenderness, no joint edema Neuro: non-focal, well-oriented, appropriate affect Breasts: Deferred   Lab Results  Component Value Date   WBC 7.1 02/14/2022   HGB 13.4 02/14/2022   HCT 41.6 02/14/2022   MCV 92.0 02/14/2022   PLT 128 (L) 02/14/2022   Lab Results  Component Value Date   FERRITIN 143 08/14/2021   IRON 73 08/14/2021   TIBC 342 08/14/2021   UIBC 269 08/14/2021   IRONPCTSAT 21 08/14/2021   Lab Results  Component Value Date   RETICCTPCT 4.2 (H) 02/14/2022   RBC 4.54 02/14/2022   No results found for: "KPAFRELGTCHN", "LAMBDASER", "KAPLAMBRATIO" No results found for: "IGGSERUM", "IGA", "IGMSERUM" No results found for: "TOTALPROTELP", "ALBUMINELP", "A1GS", "A2GS", "BETS", "BETA2SER", "GAMS", "MSPIKE", "SPEI"   Chemistry      Component Value Date/Time   NA 141 09/25/2021 1118   K 4.1 09/25/2021 1118   CL 105  09/25/2021 1118   CO2 27 09/25/2021 1118   BUN 8 09/25/2021 1118   CREATININE 0.69 09/25/2021 1118   CREATININE 0.74 09/30/2019 1047   CREATININE 0.75 07/06/2016 1534      Component Value Date/Time   CALCIUM 8.9 09/28/2021 1052   ALKPHOS 82 09/25/2021 1118   AST 37 09/25/2021 1118   AST 27 09/30/2019 1047   ALT 32 09/25/2021 1118   ALT 14 09/30/2019 1047   BILITOT 0.7 09/25/2021 1118   BILITOT 0.4 09/30/2019 1047       Impression and Plan: Ms. Mihalic is a very pleasant 31 yo caucasian female with long history of iron deficiency anemia secondary to history of heavy irregular cycles. She also has sarcoidosis and is on treatment with Methotrexate.  Iron studies are pending. We will replace if needed.  Follow-up in 1 year.  Kennon Portela, NP 11/15/20231:44 PM

## 2022-02-15 LAB — IRON AND IRON BINDING CAPACITY (CC-WL,HP ONLY)
Iron: 196 ug/dL — ABNORMAL HIGH (ref 28–170)
Saturation Ratios: 58 % — ABNORMAL HIGH (ref 10.4–31.8)
TIBC: 337 ug/dL (ref 250–450)
UIBC: 141 ug/dL — ABNORMAL LOW (ref 148–442)

## 2022-02-16 ENCOUNTER — Encounter: Payer: Self-pay | Admitting: Family

## 2022-02-21 ENCOUNTER — Ambulatory Visit: Payer: BC Managed Care – PPO | Admitting: Psychiatry

## 2022-03-01 ENCOUNTER — Ambulatory Visit: Payer: BC Managed Care – PPO | Admitting: Psychiatry

## 2022-03-01 ENCOUNTER — Other Ambulatory Visit: Payer: Self-pay | Admitting: Psychiatry

## 2022-03-01 DIAGNOSIS — F4010 Social phobia, unspecified: Secondary | ICD-10-CM

## 2022-03-01 DIAGNOSIS — F339 Major depressive disorder, recurrent, unspecified: Secondary | ICD-10-CM

## 2022-04-09 ENCOUNTER — Ambulatory Visit: Payer: BC Managed Care – PPO | Admitting: Psychiatry

## 2022-04-09 DIAGNOSIS — F4323 Adjustment disorder with mixed anxiety and depressed mood: Secondary | ICD-10-CM | POA: Diagnosis not present

## 2022-04-16 DIAGNOSIS — F4323 Adjustment disorder with mixed anxiety and depressed mood: Secondary | ICD-10-CM | POA: Diagnosis not present

## 2022-04-30 DIAGNOSIS — F4323 Adjustment disorder with mixed anxiety and depressed mood: Secondary | ICD-10-CM | POA: Diagnosis not present

## 2022-05-07 DIAGNOSIS — F4323 Adjustment disorder with mixed anxiety and depressed mood: Secondary | ICD-10-CM | POA: Diagnosis not present

## 2022-05-08 ENCOUNTER — Other Ambulatory Visit: Payer: Self-pay | Admitting: Psychiatry

## 2022-05-08 DIAGNOSIS — F4010 Social phobia, unspecified: Secondary | ICD-10-CM

## 2022-05-08 DIAGNOSIS — F339 Major depressive disorder, recurrent, unspecified: Secondary | ICD-10-CM

## 2022-05-08 NOTE — Telephone Encounter (Signed)
Please schedule appt

## 2022-05-08 NOTE — Telephone Encounter (Signed)
Pt is scheduled 05/25/22

## 2022-05-14 DIAGNOSIS — F4323 Adjustment disorder with mixed anxiety and depressed mood: Secondary | ICD-10-CM | POA: Diagnosis not present

## 2022-05-21 DIAGNOSIS — F4323 Adjustment disorder with mixed anxiety and depressed mood: Secondary | ICD-10-CM | POA: Diagnosis not present

## 2022-05-25 ENCOUNTER — Encounter: Payer: Self-pay | Admitting: Psychiatry

## 2022-05-25 ENCOUNTER — Encounter: Payer: Self-pay | Admitting: Family

## 2022-05-25 ENCOUNTER — Ambulatory Visit (INDEPENDENT_AMBULATORY_CARE_PROVIDER_SITE_OTHER): Payer: BC Managed Care – PPO | Admitting: Psychiatry

## 2022-05-25 VITALS — BP 88/73 | HR 76

## 2022-05-25 DIAGNOSIS — F411 Generalized anxiety disorder: Secondary | ICD-10-CM | POA: Diagnosis not present

## 2022-05-25 DIAGNOSIS — F332 Major depressive disorder, recurrent severe without psychotic features: Secondary | ICD-10-CM

## 2022-05-25 DIAGNOSIS — G4700 Insomnia, unspecified: Secondary | ICD-10-CM

## 2022-05-25 DIAGNOSIS — F900 Attention-deficit hyperactivity disorder, predominantly inattentive type: Secondary | ICD-10-CM | POA: Diagnosis not present

## 2022-05-25 MED ORDER — METHYLPHENIDATE HCL 10 MG PO TABS: ORAL_TABLET | ORAL | 0 refills | Status: DC

## 2022-05-25 NOTE — Progress Notes (Signed)
Margaret Golden [id number] 1991-02-17 32 y.o.  Subjective:   Patient ID:  Margaret Golden is a 32 y.o. (DOB 03-30-1991) female.  Chief Complaint:  Chief Complaint  Patient presents with   Depression   Anxiety    HPI Margaret Golden presents to the office today for follow-up of anxiety, depression, and sleep disturbance. She reports that one of her closest childhood friends died in late 03-10-2023 of unknown causes. Friend just turned 30. She went to the [facility] services in December. "January was a blur." Reports some dissociation and "losing time." Describes mood as more "numb" instead of depressed. Reports limited range of emotion. Diminished enjoyment in things. Energy and motivation have been low and affecting med compliance, hygiene, cleaning, etc. She reports that she is having difficulty falling asleep, often 5-6 am and then wakes up briefly at 1-2 pm and then returns to sleep. She reports anticipatory anxiety with leaving home and has anxiety about getting home as soon as possible. Able to feel comfortable going to friend's house. She reports that she was socially isolating for awhile and has been intentionally trying to change this. Has had panic at times without full panic attack. Notices some increased irritability. She reports, "I'm back to either not eating or binge eating." Difficulty with concentration to include activities that are usually not an issue. Denies SI.   This past Sunday was anniversary of mother's death. Mother's side of the family is having a reunion and she may try to attend this since she has not seen them for years. Estranged relationship with mother's sister. Father is in [place] and will be having a full-knee replacement next week.   Continues to see therapist every 1-2 weeks.   Starting to take meds more consistently.   Past medication trials: Wellbutrin- initially helpful and then was less  helpful Lexapro-ineffective Cymbalta Pristiq Seroquel- does not recall any significant improvement Latuda Abilify Propranolol- prescribed for HR. Not helpful for anxiety Melatonin Vyvanse Adderall XR- not effective Concerta- Notices duration only lasts about 4 hours. No significant improvement with doses less than 54 mg.  Theadora Rama- May have caused n/v and poor concentration Horizant Lamictal-effective for mood Trazodone-effective for insomnia but causes some excessive drowsiness Rexulti- May have caused n/v and poor concentration Ativan- helpful Armodafinil- jaw locked  [place] Office Visit from 05/25/2022 in [place] Office Visit from 09/28/2021 in [place] Total Score 4 6      GAD-7    Flowsheet Row Office Visit from 09/25/2021 in [facility] at [place] Visit from 11/16/2020 in [facility] at [facility]  Total GAD-7 Score 17 19      PHQ2-9    [place] Visit from 09/25/2021 in [facility] Primary [facility] at [place] Visit from 11/16/2020 in [facility] Primary [facility] at [place] Visit from 09/03/2019 in [facility] at [place] from 02/10/2019 in [facility] at [place] from 05/02/2018 in [facility] Primary Care at [facility]  PHQ-2 Total Score '6 6 3 2 4  '$ PHQ-9 Total Score '20 20 14 '$ -- [place] ED from 09/20/2021 in [facility] Emergency Department at [facility] Admission (Discharged) from 06/27/2020 in [place]  Pre-Admission Testing 60 from 06/23/2020 in [facility] PREADMISSION TESTING  C-SSRS RISK CATEGORY No Risk No Risk No Risk        Review of Systems:  Review of Systems   Constitutional:  Positive for fatigue.  Respiratory:  Positive for chest tightness and shortness of breath.   Musculoskeletal:  Positive for arthralgias. Negative for gait problem.  Neurological:  Negative for tremors.  Psychiatric/Behavioral:         Please refer to HPI    Medications: I have reviewed the patient's current medications.  Current Outpatient Medications  Medication Sig Dispense Refill   [START ON 06/22/2022] methylphenidate (RITALIN) 10 MG tablet Take 1/2-1 tab po BID prn 60 tablet 0   albuterol (VENTOLIN HFA) 108 (90 Base) MCG/ACT inhaler Inhale 2 puffs into the lungs every 6 (six) hours as needed for wheezing or shortness of breath. 8 g 2   aspirin-acetaminophen-caffeine (EXCEDRIN MIGRAINE) 250-250-65 MG tablet Take 2 tablets by mouth daily as needed for headache or migraine.     diphenhydrAMINE HCl, Sleep, (ZZZQUIL PO) Take by mouth at bedtime as needed.     folic acid (FOLVITE) 1 MG tablet Take 1 tablet (1 mg total) by mouth daily. 60 tablet 6   furosemide (LASIX) 20 MG tablet TAKE 1 TABLET BY MOUTH TWO TIMES A WEEK 26 tablet 5   gabapentin (NEURONTIN) 300 MG capsule Take 1 capsule (300 mg total) by mouth at bedtime. 90 capsule 1   Iron-FA-B Cmp-C-Biot-Probiotic (FUSION PLUS) CAPS One capsule twice daily with orange juice. Stop your Ferrous sulfate. 30 capsule 1   levonorgestrel (MIRENA, 52 MG,) 20 MCG/DAY IUD 1 each by Intrauterine route once.     levothyroxine (SYNTHROID) 100 MCG tablet TAKE 1 TABLET BY MOUTH DAILY WITH 137MCG TABLET 90 tablet 1   levothyroxine (SYNTHROID) 137 MCG tablet TAKE 1 TABLET BY MOUTH DAILY BEFORE BREAKFAST WITH 100 MCG TABLET - PT NEEDS APPOINTMENT 90 tablet 1   LORazepam (ATIVAN) 0.5 MG tablet Take 1 tablet (0.5 mg total) by mouth every 8 (eight) hours as needed for anxiety. 30 tablet 1   lurasidone (LATUDA) 40 MG TABS tablet Take 1/2 tablet daily with supper for one week, then increase to 1 tablet daily 90 tablet 0   methocarbamol (ROBAXIN)  500 MG tablet Take 1 tablet (500 mg total) by mouth every 8 (eight) hours as needed for muscle spasms. 20 tablet 0   methotrexate (RHEUMATREX) 2.5 MG tablet TAKE 6 TABLETS BY MOUTH ONCE WEEKLY PROTECT FROM LIGHT 24 tablet 5   methylphenidate (RITALIN) 10 MG tablet Take 1/2-1 tab po BID prn 60 tablet 0   Multiple Vitamins-Minerals (MULTIVITAMIN WITH MINERALS) tablet Take 1 tablet by mouth daily.     nystatin cream (MYCOSTATIN) Apply 1 Application topically 2 (two) times daily. Apply to affected area BID for up to 7 days. 30 g 1   omeprazole (PRILOSEC) 20 MG capsule TAKE 2 CAPSULES BY MOUTH DAILY 180 capsule `   potassium chloride SA (KLOR-CON) 20 MEQ tablet Take 1 tablet (20 mEq total) by mouth 2 (two) times a week. 26 tablet 0   propranolol (INDERAL) 20 MG tablet Take 1 tablet (20 mg total) by mouth 3 (three) times daily. (Patient taking differently: Take 20 mg by mouth 2 (two) times daily.) 270 tablet 3   Semaglutide-Weight Management (WEGOVY) 0.25 MG/0.5ML SOAJ Inject 0.25 mg into the skin once a week. 2 mL 0   sertraline (ZOLOFT) 100 MG tablet TAKE 2 TABLETS BY MOUTH  DAILY 120 tablet 0   topiramate (TOPAMAX) 50 MG tablet Start with '50mg'$ (1 tab) at bedtime. In 1 week increase to '100mg'$ (2 tabs) at bedtime. 180 tablet 6   Vitamin D, Ergocalciferol, (DRISDOL) 1.25 MG (50000 UNIT) CAPS capsule Take 1 capsule (50,000 Units total) by mouth 2 (two) times a week. 12 capsule 0   No current facility-administered medications for this visit.    Medication Side Effects: None  Allergies:  Allergies  Allergen Reactions   Augmentin [Amoxicillin-Pot Clavulanate] Hives    Past Medical History:  Diagnosis Date   Acid reflux    ADD (attention deficit disorder)    Anemia    Anxiety    Arthritis    left ankle   Back pain    Chest pain    Complication of anesthesia    Depression    Dyspnea    with exertion   Exercise-induced asthma    as a child   Fatty liver    History of blood transfusion     History of chicken pox    History of kidney stones    passed    Hypothyroidism    Joint pain    Lower extremity edema    Major depressive disorder    Migraines    chronic   Palpitations    Pneumonia    x 2   PONV (postoperative nausea and vomiting)    nausea   RLS (restless legs syndrome)    Sleep apnea 2021   Thyroid disease    hypothyroid    Past Medical History, Surgical history, Social history, and Family history were reviewed and updated as appropriate.   Please see review of systems for further details on the patient's review from today.   Objective:   Physical Exam:  BP (!) 88/73   Pulse 76   Physical Exam Constitutional:      General: She is not in acute distress. Musculoskeletal:        General: No deformity.  Neurological:     Mental Status: She is alert and oriented to person, place, and time.     Coordination: Coordination normal.  Psychiatric:        Attention and Perception: Attention and perception normal. She does not perceive auditory or visual hallucinations.        Mood and Affect: Mood is anxious and depressed. Affect is not labile, blunt, angry or inappropriate.        Speech: Speech normal.        Behavior: Behavior normal.        Thought Content: Thought content normal. Thought content is not paranoid or delusional. Thought content does not include homicidal or suicidal ideation. Thought content does not include homicidal or suicidal plan.        Cognition and Memory: Cognition and memory normal.        Judgment: Judgment normal.     Comments: Insight intact     Lab Review:     Component Value Date/Time   NA 141 09/25/2021 1118   K 4.1 09/25/2021 1118   CL 105 09/25/2021 1118   CO2 27 09/25/2021 1118   GLUCOSE 96 09/25/2021 1118   BUN 8 09/25/2021 1118   CREATININE 0.69 09/25/2021 1118   CREATININE 0.74 09/30/2019 1047   CREATININE 0.75 07/06/2016 1534   CALCIUM 8.9 09/28/2021 1052   PROT 6.2 09/25/2021 1118   ALBUMIN 3.8  09/25/2021 1118   AST 37 09/25/2021 1118   AST 27 09/30/2019 1047  ALT 32 09/25/2021 1118   ALT 14 09/30/2019 1047   ALKPHOS 82 09/25/2021 1118   BILITOT 0.7 09/25/2021 1118   BILITOT 0.4 09/30/2019 1047   GFRNONAA >60 06/28/2020 0703   GFRNONAA >60 09/30/2019 1047   GFRAA >60 09/30/2019 1047       Component Value Date/Time   WBC 7.1 02/14/2022 1253   WBC 9.0 09/25/2021 1118   RBC 4.54 02/14/2022 1254   RBC 4.52 02/14/2022 1253   HGB 13.4 02/14/2022 1253   HGB 9.3 (L) 08/26/2019 1045   HCT 41.6 02/14/2022 1253   HCT 33.4 (L) 08/26/2019 1045   PLT 128 (L) 02/14/2022 1253   PLT 234 08/26/2019 1045   MCV 92.0 02/14/2022 1253   MCV 72 (L) 08/26/2019 1045   MCH 29.6 02/14/2022 1253   MCHC 32.2 02/14/2022 1253   RDW 15.1 02/14/2022 1253   RDW 15.4 08/26/2019 1045   LYMPHSABS 1.5 02/14/2022 1253   MONOABS 0.3 02/14/2022 1253   EOSABS 0.2 02/14/2022 1253   BASOSABS 0.0 02/14/2022 1253    No results found for: "POCLITH", "LITHIUM"   No results found for: "PHENYTOIN", "PHENOBARB", "VALPROATE", "CBMZ"   .res Assessment: Plan:    Pt seen for 25 minutes and time spent discussing recent triggers to anxiety and depression. She reports that she prefers not to change any of her medications at this time and prefers to take her medications more consistently. She reports that she cannot recall exact response to Ritalin and would like to re-try it.  Will continue Ritalin 10 mg 1/2-1 tab po BID for ADHD.  Continue Sertraline 200 mg po qd for anxiety and depression.  Continue latuda 40 mg daily for mood symptoms. Continue Lorazepam prn.  Pt to follow-up in 2 months or sooner if clinically indicated.  Patient advised to contact office with any questions, adverse effects, or acute worsening in signs and symptoms. Recommend continuing psychotherapy.    Margaret Golden was seen today for depression and anxiety.  Diagnoses and all orders for this visit:  Severe episode of recurrent major  depressive disorder, without psychotic features ([place])  Generalized anxiety disorder  Attention deficit hyperactivity disorder (ADHD), predominantly inattentive type -     methylphenidate (RITALIN) 10 MG tablet; Take 1/2-1 tab po BID prn -     methylphenidate (RITALIN) 10 MG tablet; Take 1/2-1 tab po BID prn  Insomnia, unspecified type     Please see After Visit Summary for patient specific instructions.  Future Appointments  Date Time Provider [place]  07/24/2022 12:45 PM Thayer Headings, PMHNP [facility] None  02/13/2023  1:00 PM [facility] None  02/13/2023  1:15 PM Celso Amy, NP [facility] None    No orders of the defined types were placed in this encounter.   -------------------------------

## 2022-05-28 DIAGNOSIS — F4323 Adjustment disorder with mixed anxiety and depressed mood: Secondary | ICD-10-CM | POA: Diagnosis not present

## 2022-06-04 DIAGNOSIS — F4323 Adjustment disorder with mixed anxiety and depressed mood: Secondary | ICD-10-CM | POA: Diagnosis not present

## 2022-06-11 DIAGNOSIS — F4323 Adjustment disorder with mixed anxiety and depressed mood: Secondary | ICD-10-CM | POA: Diagnosis not present

## 2022-06-18 DIAGNOSIS — F4323 Adjustment disorder with mixed anxiety and depressed mood: Secondary | ICD-10-CM | POA: Diagnosis not present

## 2022-06-20 ENCOUNTER — Encounter: Payer: Self-pay | Admitting: Family

## 2022-06-20 ENCOUNTER — Ambulatory Visit (INDEPENDENT_AMBULATORY_CARE_PROVIDER_SITE_OTHER): Payer: BC Managed Care – PPO | Admitting: Family Medicine

## 2022-06-20 ENCOUNTER — Encounter: Payer: Self-pay | Admitting: Family Medicine

## 2022-06-20 VITALS — BP 126/66 | HR 92 | Resp 18 | Ht 67.0 in | Wt 378.0 lb

## 2022-06-20 DIAGNOSIS — F32A Depression, unspecified: Secondary | ICD-10-CM

## 2022-06-20 DIAGNOSIS — R1011 Right upper quadrant pain: Secondary | ICD-10-CM

## 2022-06-20 DIAGNOSIS — R112 Nausea with vomiting, unspecified: Secondary | ICD-10-CM | POA: Diagnosis not present

## 2022-06-20 DIAGNOSIS — R1013 Epigastric pain: Secondary | ICD-10-CM | POA: Diagnosis not present

## 2022-06-20 DIAGNOSIS — F419 Anxiety disorder, unspecified: Secondary | ICD-10-CM

## 2022-06-20 MED ORDER — ONDANSETRON HCL 4 MG PO TABS: 4.0000 mg | ORAL_TABLET | Freq: Three times a day (TID) | ORAL | 0 refills | Status: AC | PRN

## 2022-06-20 NOTE — Patient Instructions (Signed)
Labs and ultrasound ordered Zofran added for nausea Continue with gentle/bland diet for now   Please contact office for follow-up if symptoms do not improve or worsen. Seek emergency care if symptoms become severe.

## 2022-06-20 NOTE — Progress Notes (Unsigned)
Acute Office Visit  Subjective:     Patient ID: Margaret Golden, female    DOB: December 11, 1990, 32 y.o.   MRN: [id number]  Chief Complaint  Patient presents with   Nausea    Onset 2-3 weeks  Diarrhea, nausea and vomiting  Dizziness     HPI Patient is in today for GI complaints.  Patient reports she initially had some nausea, vomiting, and diarrhea for a few days 2 weeks ago - reports it was after eating Janine Limbo so she thought it may have been related to that. She did have some chills and body aches the first couple of days but most symptoms resolved. Only ongoing symptoms now is nausea and occasional vomiting. Nausea is intermittent throughout the day, typically worse first in the morning, eases during the day, and then intensifies in the early evening after trying to eat dinner. States she has been trying to eat mostly bland foods the past few days and that seems to be helping somewhat. Last vomiting episode was 2-3 days ago. She does have some intermittent generalized abdominal discomfort, but no severe pain. States she is not sexually active and no change of pregnancy.     ROS All review of systems negative except what is listed in the HPI      Objective:    BP 126/66 (BP Location: Left Arm, Patient Position: Sitting, Cuff Size: Large)   Pulse 92   Resp 18   Ht 5\' 7"  (1.702 m)   Wt (!) 378 lb (171.5 kg)   LMP 06/18/2022 (Exact Date)   SpO2 99%   BMI 59.20 kg/m    Physical Exam Vitals reviewed.  Constitutional:      Appearance: Normal appearance. She is obese.  Cardiovascular:     Rate and Rhythm: Normal rate and regular rhythm.     Pulses: Normal pulses.     Heart sounds: Normal heart sounds.  Pulmonary:     Effort: Pulmonary effort is normal.     Breath sounds: Normal breath sounds.  Abdominal:     General: There is no distension.     Palpations: There is no mass.     Tenderness: There is abdominal tenderness in the right upper quadrant. There is no  rebound. Positive signs include Murphy's sign. Negative signs include McBurney's sign.  Skin:    General: Skin is warm and dry.  Neurological:     Mental Status: She is alert and oriented to person, place, and time.  Psychiatric:        Mood and Affect: Mood normal.        Behavior: Behavior normal.        Thought Content: Thought content normal.        Judgment: Judgment normal.     No results found for any visits on 06/20/22.      Assessment & Plan:   1. Epigastric pain 2. RUQ pain 3. Nausea and vomiting, unspecified vomiting type Labs and ultrasound ordered Zofran added for nausea Continue with gentle/bland diet for now  Patient aware of signs/symptoms requiring further/urgent evaluation.   - CBC with Differential/Platelet - Comprehensive metabolic panel - Lipase - H. pylori breath test - US Abdomen Limited RUQ (LIVER/GB); Future - ondansetron (ZOFRAN) 4 MG tablet; Take 1 tablet (4 mg total) by mouth every 8 (eight) hours as needed for nausea or vomiting.  Dispense: 20 tablet; Refill: 0  4. Anxiety and depression Due for phq/gad today - both elevated. States she is managing  fine and is scheduled to see psych in a few weeks. Gets counseling at least every other week. No SI/HI.    Meds ordered this encounter  Medications   ondansetron (ZOFRAN) 4 MG tablet    Sig: Take 1 tablet (4 mg total) by mouth every 8 (eight) hours as needed for nausea or vomiting.    Dispense:  20 tablet    Refill:  0    Order Specific Question:   Supervising Provider    Answer:   Penni Homans A [[id number]]    Return if symptoms worsen or fail to improve.  Terrilyn Saver, NP

## 2022-06-21 ENCOUNTER — Ambulatory Visit (HOSPITAL_BASED_OUTPATIENT_CLINIC_OR_DEPARTMENT_OTHER)
Admission: RE | Admit: 2022-06-21 | Discharge: 2022-06-21 | Disposition: A | Discharge status: Home / Self Care | Payer: BC Managed Care – PPO | Source: Ambulatory Visit | Attending: Family Medicine | Admitting: Family Medicine

## 2022-06-21 DIAGNOSIS — R112 Nausea with vomiting, unspecified: Secondary | ICD-10-CM | POA: Insufficient documentation

## 2022-06-21 DIAGNOSIS — R1011 Right upper quadrant pain: Secondary | ICD-10-CM | POA: Diagnosis not present

## 2022-06-21 DIAGNOSIS — R1013 Epigastric pain: Secondary | ICD-10-CM | POA: Diagnosis not present

## 2022-06-21 LAB — CBC WITH DIFFERENTIAL/PLATELET
Basophils Absolute: 0 10*3/uL (ref 0.0–0.1)
Basophils Relative: 0.4 % (ref 0.0–3.0)
Eosinophils Absolute: 0.2 10*3/uL (ref 0.0–0.7)
Eosinophils Relative: 2.6 % (ref 0.0–5.0)
HCT: 40.2 % (ref 36.0–46.0)
Hemoglobin: 13.5 g/dL (ref 12.0–15.0)
Lymphocytes Relative: 17.1 % (ref 12.0–46.0)
Lymphs Abs: 1 10*3/uL (ref 0.7–4.0)
MCHC: 33.5 g/dL (ref 30.0–36.0)
MCV: 90.9 fl (ref 78.0–100.0)
Monocytes Absolute: 0.3 10*3/uL (ref 0.1–1.0)
Monocytes Relative: 4.8 % (ref 3.0–12.0)
Neutro Abs: 4.5 10*3/uL (ref 1.4–7.7)
Neutrophils Relative %: 75.1 % (ref 43.0–77.0)
Platelets: 138 10*3/uL — ABNORMAL LOW (ref 150.0–400.0)
RBC: 4.42 Mil/uL (ref 3.87–5.11)
RDW: 16.9 % — ABNORMAL HIGH (ref 11.5–15.5)
WBC: 6 10*3/uL (ref 4.0–10.5)

## 2022-06-21 LAB — COMPREHENSIVE METABOLIC PANEL
ALT: 38 U/L — ABNORMAL HIGH (ref 0–35)
AST: 78 U/L — ABNORMAL HIGH (ref 0–37)
Albumin: 4.2 g/dL (ref 3.5–5.2)
Alkaline Phosphatase: 83 U/L (ref 39–117)
BUN: 12 mg/dL (ref 6–23)
CO2: 26 mEq/L (ref 19–32)
Calcium: 8.8 mg/dL (ref 8.4–10.5)
Chloride: 105 mEq/L (ref 96–112)
Creatinine, Ser: 0.77 mg/dL (ref 0.40–1.20)
GFR: 102.17 mL/min (ref >60.00)
Glucose, Bld: 94 mg/dL (ref 70–99)
Potassium: 4.1 mEq/L (ref 3.5–5.1)
Sodium: 140 mEq/L (ref 135–145)
Total Bilirubin: 1.1 mg/dL (ref 0.2–1.2)
Total Protein: 7 g/dL (ref 6.0–8.3)

## 2022-06-21 LAB — H. PYLORI ANTIBODY, IGG: H Pylori IgG: NEGATIVE

## 2022-06-21 LAB — LIPASE: Lipase: 9 U/L — ABNORMAL LOW (ref 11.0–59.0)

## 2022-06-26 DIAGNOSIS — F4323 Adjustment disorder with mixed anxiety and depressed mood: Secondary | ICD-10-CM | POA: Diagnosis not present

## 2022-07-02 ENCOUNTER — Other Ambulatory Visit: Payer: Self-pay | Admitting: Pulmonary Disease

## 2022-07-02 DIAGNOSIS — D869 Sarcoidosis, unspecified: Secondary | ICD-10-CM

## 2022-07-02 DIAGNOSIS — F4323 Adjustment disorder with mixed anxiety and depressed mood: Secondary | ICD-10-CM | POA: Diagnosis not present

## 2022-07-04 ENCOUNTER — Other Ambulatory Visit: Payer: Self-pay | Admitting: Family

## 2022-07-04 DIAGNOSIS — E039 Hypothyroidism, unspecified: Secondary | ICD-10-CM

## 2022-07-09 DIAGNOSIS — F4323 Adjustment disorder with mixed anxiety and depressed mood: Secondary | ICD-10-CM | POA: Diagnosis not present

## 2022-07-16 ENCOUNTER — Encounter: Payer: Self-pay | Admitting: *Deleted

## 2022-07-16 ENCOUNTER — Other Ambulatory Visit: Payer: Self-pay | Admitting: Psychiatry

## 2022-07-16 ENCOUNTER — Other Ambulatory Visit: Payer: Self-pay | Admitting: Pulmonary Disease

## 2022-07-16 DIAGNOSIS — F339 Major depressive disorder, recurrent, unspecified: Secondary | ICD-10-CM

## 2022-07-16 DIAGNOSIS — F4323 Adjustment disorder with mixed anxiety and depressed mood: Secondary | ICD-10-CM | POA: Diagnosis not present

## 2022-07-16 DIAGNOSIS — D869 Sarcoidosis, unspecified: Secondary | ICD-10-CM

## 2022-07-16 DIAGNOSIS — F4010 Social phobia, unspecified: Secondary | ICD-10-CM

## 2022-07-23 DIAGNOSIS — F4323 Adjustment disorder with mixed anxiety and depressed mood: Secondary | ICD-10-CM | POA: Diagnosis not present

## 2022-07-24 ENCOUNTER — Encounter: Payer: Self-pay | Admitting: Psychiatry

## 2022-07-24 ENCOUNTER — Ambulatory Visit (INDEPENDENT_AMBULATORY_CARE_PROVIDER_SITE_OTHER): Payer: BC Managed Care – PPO | Admitting: Psychiatry

## 2022-07-24 DIAGNOSIS — F339 Major depressive disorder, recurrent, unspecified: Secondary | ICD-10-CM | POA: Diagnosis not present

## 2022-07-24 DIAGNOSIS — F900 Attention-deficit hyperactivity disorder, predominantly inattentive type: Secondary | ICD-10-CM

## 2022-07-24 DIAGNOSIS — G2581 Restless legs syndrome: Secondary | ICD-10-CM | POA: Diagnosis not present

## 2022-07-24 DIAGNOSIS — F4010 Social phobia, unspecified: Secondary | ICD-10-CM

## 2022-07-24 MED ORDER — METHYLPHENIDATE HCL 10 MG PO TABS
ORAL_TABLET | ORAL | 0 refills | Status: DC
Start: 2022-08-21 — End: 2022-09-25

## 2022-07-24 MED ORDER — GABAPENTIN 300 MG PO CAPS
300.0000 mg | ORAL_CAPSULE | Freq: Every day | ORAL | 1 refills | Status: DC
Start: 2022-07-24 — End: 2022-09-25

## 2022-07-24 MED ORDER — LURASIDONE HCL 40 MG PO TABS: 40.0000 mg | ORAL_TABLET | Freq: Every day | ORAL | 1 refills | Status: DC

## 2022-07-24 NOTE — Progress Notes (Unsigned)
Margaret Golden [id number] 1990/08/21 31 y.o.  Subjective:   Patient ID:  Margaret Golden is a 32 y.o. (DOB 18-Jan-1991) female.  Chief Complaint: No chief complaint on file.   HPI Margaret Golden presents to the office today for follow-up of depression, anxiety, and ADHD.   She reports that she recently had about a week long period of severe dissociation. She reports financial stress and stress related to awaiting disability determination. She reports some worry and rumination. She reports that she has had more panic and anxiety- "but I don't think I have had a full blown panic attack." Reports increased irritability. Reports, "it hasn't felt like the depression I am used to" and feels more irritable. Energy and motivation have been low. Reports that anxiety interferes with completing some tasks. Reports that house is in disarray and feels "scattered." Reports waking up at a consistent time regardless of when going to sleep. Difficulty falling asleep. Reports that sleep varies overall. Has been lying down to rest daily- "I can't get through my day without laying down." Appetite is "up and down." Denies any recent binge eating. Reports eating some, even when not wanting to eat. Denies SI.   Reports some hyper-focus at times.   Has been taking Ritalin 5 mg as needed and feels that this may be helpful.   Reports that changes in seasons seem to affect mood.   Continues to see therapist.   Past medication trials: Wellbutrin- initially helpful and then was less helpful Lexapro-ineffective Cymbalta Pristiq Seroquel- does not recall any significant improvement Latuda Abilify Propranolol- prescribed for HR. Not helpful for anxiety Melatonin Vyvanse Adderall XR- not effective Concerta- Notices duration only lasts about 4 hours. No significant improvement with doses less than 54 mg.  [name]- May have caused n/v and poor concentration Horizant Lamictal-effective for  mood Trazodone-effective for insomnia but causes some excessive drowsiness Rexulti- May have caused n/v and poor concentration Ativan- helpful Armodafinil- jaw locked  AIMS    Flowsheet Row Office Visit from 05/25/2022 in [facility] Office Visit from 09/28/2021 in [facility]  AIMS Total Score 4 6      GAD-7    Flowsheet Row Office Visit from 06/20/2022 in [facility] at [facility] Office Visit from 09/25/2021 in [facility] at [facility] Office Visit from 11/16/2020 in [facility] Primary Care at [facility]  Total GAD-7 Score 14 17 19       PHQ2-9    Flowsheet Row Office Visit from 06/20/2022 in [facility] Primary Care at [facility] Office Visit from 09/25/2021 in [facility] Primary Care at [facility] Office Visit from 11/16/2020 in [name] Primary Care at [facility] Office Visit from 09/03/2019 in [facility] Nutrition from 02/10/2019 in [facility] at [facility] Total Score 4 6 6 3 2   PHQ-9 Total Score 19 20 20 14  --      Flowsheet Row ED from 09/20/2021 in [facility] Emergency Department at [facility] Admission (Discharged) from 06/27/2020 in [facility] 4E CV SURGICAL PROGRESSIVE CARE Pre-Admission Testing 60 from 06/23/2020 in [facility] PREADMISSION TESTING  C-SSRS RISK CATEGORY No Risk No Risk No Risk        Review of Systems:  Review of Systems  Constitutional:  Positive for fatigue.  Cardiovascular:  Positive for palpitations.  Gastrointestinal:  Positive for nausea.  Musculoskeletal:  Negative for gait problem.  Neurological:  Positive for light-headedness.  Psychiatric/Behavioral:         Please refer to HPI   Reports that she has not had Methotrexate for about a month  after med was designed.   Medications: I have reviewed the patient's current medications.  Current Outpatient Medications  Medication Sig Dispense Refill   albuterol (VENTOLIN HFA) 108 (90 Base) MCG/ACT inhaler Inhale 2 puffs into the lungs every 6 (six) hours as needed for wheezing or shortness of breath. 8 g 2   aspirin-acetaminophen-caffeine (EXCEDRIN MIGRAINE) 250-250-65 MG tablet Take 2 tablets by mouth daily as needed for headache or migraine.     diphenhydrAMINE HCl, Sleep, (ZZZQUIL PO) Take by mouth at bedtime as needed.     folic acid (FOLVITE) 1 MG tablet Take 1 tablet (1 mg total) by mouth daily. 60 tablet 6   furosemide (LASIX) 20 MG tablet TAKE 1 TABLET BY MOUTH TWO TIMES A WEEK 26 tablet 5   gabapentin (NEURONTIN) 300 MG capsule Take 1 capsule (300 mg total) by mouth at bedtime. 90 capsule 1   Iron-FA-B Cmp-C-Biot-Probiotic (FUSION PLUS) CAPS One capsule twice daily with orange juice. Stop your Ferrous sulfate. 30 capsule 1   levonorgestrel (MIRENA, 52 MG,) 20 MCG/DAY IUD 1 each by Intrauterine route once.     levothyroxine (SYNTHROID) 100 MCG tablet TAKE 1 TABLET BY MOUTH DAILY WITH 137 MCG TABLET 90 tablet 1   levothyroxine (SYNTHROID) 137 MCG tablet TAKE 1 TABLET BY MOUTH DAILY BEFORE BREAKFAST WITH 100 MCG TABLET 90 tablet 1   LORazepam (ATIVAN) 0.5 MG tablet Take 1 tablet (0.5 mg total) by mouth every 8 (eight) hours as needed for anxiety. 30 tablet 1   lurasidone (LATUDA) 40 MG TABS tablet Take 1/2 tablet daily with supper for one week, then increase to 1 tablet daily 90 tablet 0   methocarbamol (ROBAXIN) 500 MG tablet Take 1 tablet (500 mg total) by mouth every 8 (eight) hours as needed for muscle spasms. 20 tablet 0   methotrexate (RHEUMATREX) 2.5 MG tablet TAKE 6 TABLETS BY MOUTH ONCE WEEKLY PROTECT FROM LIGHT 24 tablet 5   methylphenidate (RITALIN) 10 MG tablet Take 1/2-1 tab po BID prn 60 tablet 0   methylphenidate (RITALIN) 10 MG tablet Take 1/2-1 tab po BID prn 60  tablet 0   Multiple Vitamins-Minerals (MULTIVITAMIN WITH MINERALS) tablet Take 1 tablet by mouth daily.     nystatin cream (MYCOSTATIN) Apply 1 Application topically 2 (two) times daily. Apply to affected area BID for up to 7 days. (Patient not taking: Reported on 06/20/2022) 30 g 1   omeprazole (PRILOSEC) 20 MG capsule TAKE 2 CAPSULES BY MOUTH DAILY 180 capsule `   ondansetron (ZOFRAN) 4 MG tablet Take 1 tablet (4 mg total) by mouth every 8 (eight) hours as needed for nausea or vomiting. 20 tablet 0   potassium chloride SA (KLOR-CON) 20 MEQ tablet Take 1 tablet (20 mEq total) by mouth 2 (two) times a week. 26 tablet 0   propranolol (INDERAL) 20 MG tablet Take 1 tablet (20 mg total) by mouth 3 (three) times daily. (Patient taking differently: Take 20 mg by mouth 2 (two) times daily.) 270 tablet 3   Semaglutide-Weight Management (WEGOVY) 0.25 MG/0.5ML SOAJ Inject 0.25 mg into the skin once a week. (Patient not taking: Reported on 06/20/2022) 2 mL 0   sertraline (ZOLOFT) 100 MG tablet TAKE 2 TABLETS  BY MOUTH DAILY 60 tablet 0   topiramate (TOPAMAX) 50 MG tablet Start with 50mg (1 tab) at bedtime. In 1 week increase to 100mg (2 tabs) at bedtime. 180 tablet 6   Vitamin D, Ergocalciferol, (DRISDOL) 1.25 MG (50000 UNIT) CAPS capsule Take 1 capsule (50,000 Units total) by mouth 2 (two) times a week. 12 capsule 0   No current facility-administered medications for this visit.    Medication Side Effects: None  Allergies:  Allergies  Allergen Reactions   Augmentin [Amoxicillin-Pot Clavulanate] Hives    Past Medical History:  Diagnosis Date   Acid reflux    ADD (attention deficit disorder)    Anemia    Anxiety    Arthritis    left ankle   Back pain    Chest pain    Complication of anesthesia    Depression    Dyspnea    with exertion   Exercise-induced asthma    as a child   Fatty liver    History of blood transfusion    History of chicken pox    History of kidney stones    passed     Hypothyroidism    Joint pain    Lower extremity edema    Major depressive disorder    Migraines    chronic   Palpitations    Pneumonia    x 2   PONV (postoperative nausea and vomiting)    nausea   RLS (restless legs syndrome)    Sleep apnea 2021   Thyroid disease    hypothyroid    Past Medical History, Surgical history, Social history, and Family history were reviewed and updated as appropriate.   Please see review of systems for further details on the patient's review from today.   Objective:   Physical Exam:  There were no vitals taken for this visit.  Physical Exam  Lab Review:     Component Value Date/Time   NA 140 06/20/2022 1613   K 4.1 06/20/2022 1613   CL 105 06/20/2022 1613   CO2 26 06/20/2022 1613   GLUCOSE 94 06/20/2022 1613   BUN 12 06/20/2022 1613   CREATININE 0.77 06/20/2022 1613   CREATININE 0.74 09/30/2019 1047   CREATININE 0.75 07/06/2016 1534   CALCIUM 8.8 06/20/2022 1613   PROT 7.0 06/20/2022 1613   ALBUMIN 4.2 06/20/2022 1613   AST 78 (H) 06/20/2022 1613   AST 27 09/30/2019 1047   ALT 38 (H) 06/20/2022 1613   ALT 14 09/30/2019 1047   ALKPHOS 83 06/20/2022 1613   BILITOT 1.1 06/20/2022 1613   BILITOT 0.4 09/30/2019 1047   GFRNONAA >60 06/28/2020 0703   GFRNONAA >60 09/30/2019 1047   GFRAA >60 09/30/2019 1047       Component Value Date/Time   WBC 6.0 06/20/2022 1613   RBC 4.42 06/20/2022 1613   HGB 13.5 06/20/2022 1613   HGB 13.4 02/14/2022 1253   HGB 9.3 (L) 08/26/2019 1045   HCT 40.2 06/20/2022 1613   HCT 33.4 (L) 08/26/2019 1045   PLT 138.0 (L) 06/20/2022 1613   PLT 128 (L) 02/14/2022 1253   PLT 234 08/26/2019 1045   MCV 90.9 06/20/2022 1613   MCV 72 (L) 08/26/2019 1045   MCH 29.6 02/14/2022 1253   MCHC 33.5 06/20/2022 1613   RDW 16.9 (H) 06/20/2022 1613   RDW 15.4 08/26/2019 1045   LYMPHSABS 1.0 06/20/2022 1613   MONOABS 0.3 06/20/2022 1613   EOSABS 0.2 06/20/2022 1613   BASOSABS 0.0 06/20/2022 1613  No results  found for: "POCLITH", "LITHIUM"   No results found for: "PHENYTOIN", "PHENOBARB", "VALPROATE", "CBMZ"   .res Assessment: Plan:    There are no diagnoses linked to this encounter.   Please see After Visit Summary for patient specific instructions.  Future Appointments  Date Time Provider Department Center  07/31/2022  2:30 PM Glenford Bayley, NP [facility] None  02/13/2023  1:00 PM [facility] None  02/13/2023  1:15 PM Erenest Blank, NP [facility] None    No orders of the defined types were placed in this encounter.   -------------------------------

## 2022-07-30 NOTE — Progress Notes (Signed)
@Patient  ID: Margaret Golden, female    DOB: 02/16/1991, 32 y.o.   MRN: [id number]  Chief Complaint  Patient presents with   Follow-up    Pt states she is about the same since last visit.    Referring provider: [name], NP  HPI: 32 year old female, never smoked.  Past medical history significant for hypertension, mediastinal lymphadenopathy, sarcoidosis and obesity.  Patient of Dr. Francine Graven, last seen in office on 06/27/2021.  Patient was diagnosed with sarcoidosis via mediastinoscopy on 06/27/20.  Previous [facility] pulmonary encounter: 06/27/21- Dr. Jamey Reas Bramlet is a 32 year old woman, non-smoker with history of anemia, hypertension and obesity who returns to pulmonary clinic for follow up of sarcoidosis.    She continues on 15mg  methotrexate weekly since last fall. She denies any improvement in her chest pains or dyspnea. No updated lab work since 01/2021. She has gained 14lbs since last visit. She complains of feeling sluggish and having brain fog for 2-3 days after taking her weekly dose of methotrexate.   She is not using CPAP.   OV 08/2020 She was started on methotrexate 5mg  weekly at last visit. She has tolerated the medication well thus far. She does not notice any change in her dyspnea on exertion. She continues to have intermittent chest pains.   Pulmonary function tests are normal overall but they show increased diffusion capacity.   OV 07/26/20 She developed chest discomfort and shortness of breath about 1 year ago. She was hospitalized for symptomatic anemia due to heavy menses. Her hemoglobin has remained in the normal range since but she continued to have the above symptoms. She was evaluated by Cardiology prior to hysteroscopy who performed an ECHO which was normal and a CTA coronary scan which showed mediastinal adenopathy and pulmonary nodules.   We have followed her thoracic adenopathy with serial CT chest scans which demonstrated progression of the  adenopathy prompting a PET scan on 04/27/20 which demonstrated increased uptake in her lymph nodes and also the right upper lobe nodule. She was taken for EBUS on 03/31/20 with sampling of the station 7 lymph node with non-diagnostic results. She was then referred to Thoracic Surgery who performed mediastinoscopy on 06/27/20 with lymph node samples  with non-caseating granulomas consistent with sarcoidosis.     She denies any vision changes, joint pains or skin rashes. She continues to have exertional dyspnea, chest pressure and has started to wake up short of breath at night at times.   No family members with sarcoidosis.   10/11/2021 Patient presents today for a 7-month follow-up. She feels she is doing some worse since last visit.  She is needing more help around her house to complete ADLs. She experiences chest tightness and gets very short winded with any activity. She also reports right sided pleuritic pain when bending over or coughing since having biopsy. She is taking 300mg  Neurontin at bedtime for restless leg. She is maintained on 15mg  methotrexate weekly for her sarcoidosis. She reports experiencing symptoms of severe fatigue and headache after taking methotrexate dose. These symptoms will last on average 24 hours but fatigue can last up to 6 days. Pharmacy did not recommend splitting up methotrexate dose. She currently has an IUD for birthcontrol. She had CMET and CBC with PCP in June which were normal. CT chest in April 2023  showed decrease in prominence of mediastinal lymph nodes seen previously. Resolution pulmonary nodules.   07/31/2022- Interim hx  Patient presents today for regular follow-up. Patient was  diagnosed with sarcoidosis via mediastinoscopy on 06/27/20. CT chest in April 2023  showed decrease in prominence of mediastinal lymph nodes seen previously. Resolution pulmonary nodules.  She is doing alright today. She is feeling a little more short winded. She has chest pain first  thing in the morning. She has not had cholesterol panel checked in awhile. She is overdue for physical and will schedule. She ran out of methotrexate 4 weeks ago, needs refill. She is taking folic acid. She had normal pulmonary function testing in June 2022. Hx severe sleep apnea. PSG in November 2021, AHI 48.9/hour with SpO2 low 87% (baseline was 97%). She did not tolerate CPAP. She is open to repeating sleep study. She has an adjustable bed frame which she uses at an incline. Echocardiogram in June 2021 was normal.    Allergies  Allergen Reactions   Augmentin [Amoxicillin-Pot Clavulanate] Hives    Immunization History  Administered Date(s) Administered   Influenza,inj,Quad PF,6+ Mos 01/03/2015, 11/28/2015, 04/19/2017, 12/12/2018, 01/20/2020   PFIZER(Purple Top)SARS-COV-2 Vaccination 06/25/2019, 07/20/2019   PNEUMOCOCCAL CONJUGATE-20 11/16/2020   Tdap 02/02/2015    Past Medical History:  Diagnosis Date   Acid reflux    ADD (attention deficit disorder)    Anemia    Anxiety    Arthritis    left ankle   Back pain    Chest pain    Complication of anesthesia    Depression    Dyspnea    with exertion   Exercise-induced asthma    as a child   Fatty liver    History of blood transfusion    History of chicken pox    History of kidney stones    passed    Hypothyroidism    Joint pain    Lower extremity edema    Major depressive disorder    Migraines    chronic   Palpitations    Pneumonia    x 2   PONV (postoperative nausea and vomiting)    nausea   RLS (restless legs syndrome)    Sleep apnea 2021   Thyroid disease    hypothyroid    Tobacco History: Social History   Tobacco Use  Smoking Status Never   Passive exposure: Never  Smokeless Tobacco Never   Counseling given: Not Answered   Outpatient Medications Prior to Visit  Medication Sig Dispense Refill   albuterol (VENTOLIN HFA) 108 (90 Base) MCG/ACT inhaler Inhale 2 puffs into the lungs every 6 (six) hours  as needed for wheezing or shortness of breath. 8 g 2   aspirin-acetaminophen-caffeine (EXCEDRIN MIGRAINE) 250-250-65 MG tablet Take 2 tablets by mouth daily as needed for headache or migraine.     diphenhydrAMINE HCl, Sleep, (ZZZQUIL PO) Take by mouth at bedtime as needed.     folic acid (FOLVITE) 1 MG tablet Take 1 tablet (1 mg total) by mouth daily. 60 tablet 6   furosemide (LASIX) 20 MG tablet TAKE 1 TABLET BY MOUTH TWO TIMES A WEEK 26 tablet 5   gabapentin (NEURONTIN) 300 MG capsule Take 1 capsule (300 mg total) by mouth at bedtime. 90 capsule 1   Iron-FA-B Cmp-C-Biot-Probiotic (FUSION PLUS) CAPS One capsule twice daily with orange juice. Stop your Ferrous sulfate. 30 capsule 1   levonorgestrel (MIRENA, 52 MG,) 20 MCG/DAY IUD 1 each by Intrauterine route once.     levothyroxine (SYNTHROID) 100 MCG tablet TAKE 1 TABLET BY MOUTH DAILY WITH 137 MCG TABLET 90 tablet 1   levothyroxine (SYNTHROID) 137 MCG tablet TAKE 1  TABLET BY MOUTH DAILY BEFORE BREAKFAST WITH 100 MCG TABLET 90 tablet 1   LORazepam (ATIVAN) 0.5 MG tablet Take 1 tablet (0.5 mg total) by mouth every 8 (eight) hours as needed for anxiety. 30 tablet 1   lurasidone (LATUDA) 40 MG TABS tablet Take 1 tablet (40 mg total) by mouth daily with supper. Take 1/2 tablet daily with supper for one week, then increase to 1 tablet daily 90 tablet 1   methocarbamol (ROBAXIN) 500 MG tablet Take 1 tablet (500 mg total) by mouth every 8 (eight) hours as needed for muscle spasms. 20 tablet 0   methotrexate (RHEUMATREX) 2.5 MG tablet TAKE 6 TABLETS BY MOUTH ONCE WEEKLY PROTECT FROM LIGHT 24 tablet 5   methylphenidate (RITALIN) 10 MG tablet Take 1/2-1 tab po BID prn 60 tablet 0   [START ON 08/21/2022] methylphenidate (RITALIN) 10 MG tablet Take 1/2-1 tab po BID prn 60 tablet 0   Multiple Vitamins-Minerals (MULTIVITAMIN WITH MINERALS) tablet Take 1 tablet by mouth daily.     nystatin cream (MYCOSTATIN) Apply 1 Application topically 2 (two) times daily. Apply  to affected area BID for up to 7 days. 30 g 1   omeprazole (PRILOSEC) 20 MG capsule TAKE 2 CAPSULES BY MOUTH DAILY 180 capsule `   ondansetron (ZOFRAN) 4 MG tablet Take 1 tablet (4 mg total) by mouth every 8 (eight) hours as needed for nausea or vomiting. 20 tablet 0   potassium chloride SA (KLOR-CON) 20 MEQ tablet Take 1 tablet (20 mEq total) by mouth 2 (two) times a week. 26 tablet 0   propranolol (INDERAL) 20 MG tablet Take 1 tablet (20 mg total) by mouth 3 (three) times daily. (Patient taking differently: Take 20 mg by mouth 2 (two) times daily.) 270 tablet 3   Semaglutide-Weight Management (WEGOVY) 0.25 MG/0.5ML SOAJ Inject 0.25 mg into the skin once a week. 2 mL 0   sertraline (ZOLOFT) 100 MG tablet TAKE 2 TABLETS BY MOUTH DAILY 60 tablet 0   topiramate (TOPAMAX) 50 MG tablet Start with 50mg (1 tab) at bedtime. In 1 week increase to 100mg (2 tabs) at bedtime. 180 tablet 6   Vitamin D, Ergocalciferol, (DRISDOL) 1.25 MG (50000 UNIT) CAPS capsule Take 1 capsule (50,000 Units total) by mouth 2 (two) times a week. 12 capsule 0   No facility-administered medications prior to visit.   Review of Systems  Review of Systems  Constitutional: Negative.   HENT: Negative.    Respiratory:  Positive for shortness of breath. Negative for cough.   Cardiovascular:  Positive for chest pain and palpitations.   Physical Exam  BP 130/80 (BP Location: Right Wrist, Patient Position: Sitting, Cuff Size: Normal)   Pulse 94   Temp 98.1 F (36.7 C) (Oral)   Ht 5\' 7"  (1.702 m)   Wt (!) 381 lb 9.6 oz (173.1 kg)   SpO2 96% Comment: RA  BMI 59.77 kg/m  Physical Exam Constitutional:      General: She is not in acute distress.    Appearance: Normal appearance. She is obese. She is not ill-appearing.  HENT:     Head: Normocephalic and atraumatic.  Cardiovascular:     Rate and Rhythm: Normal rate and regular rhythm.  Pulmonary:     Effort: Pulmonary effort is normal.     Breath sounds: Normal breath sounds.  No wheezing, rhonchi or rales.  Musculoskeletal:        General: Normal range of motion.  Skin:    General: Skin is warm and dry.  Neurological:     General: No focal deficit present.     Mental Status: She is alert and oriented to person, place, and time. Mental status is at baseline.  Psychiatric:        Mood and Affect: Mood normal.        Behavior: Behavior normal.        Thought Content: Thought content normal.        Judgment: Judgment normal.      Lab Results:  CBC    Component Value Date/Time   WBC 6.0 06/20/2022 1613   RBC 4.42 06/20/2022 1613   HGB 13.5 06/20/2022 1613   HGB 13.4 02/14/2022 1253   HGB 9.3 (L) 08/26/2019 1045   HCT 40.2 06/20/2022 1613   HCT 33.4 (L) 08/26/2019 1045   PLT 138.0 (L) 06/20/2022 1613   PLT 128 (L) 02/14/2022 1253   PLT 234 08/26/2019 1045   MCV 90.9 06/20/2022 1613   MCV 72 (L) 08/26/2019 1045   MCH 29.6 02/14/2022 1253   MCHC 33.5 06/20/2022 1613   RDW 16.9 (H) 06/20/2022 1613   RDW 15.4 08/26/2019 1045   LYMPHSABS 1.0 06/20/2022 1613   MONOABS 0.3 06/20/2022 1613   EOSABS 0.2 06/20/2022 1613   BASOSABS 0.0 06/20/2022 1613    BMET    Component Value Date/Time   NA 140 06/20/2022 1613   K 4.1 06/20/2022 1613   CL 105 06/20/2022 1613   CO2 26 06/20/2022 1613   GLUCOSE 94 06/20/2022 1613   BUN 12 06/20/2022 1613   CREATININE 0.77 06/20/2022 1613   CREATININE 0.74 09/30/2019 1047   CREATININE 0.75 07/06/2016 1534   CALCIUM 8.8 06/20/2022 1613   GFRNONAA >60 06/28/2020 0703   GFRNONAA >60 09/30/2019 1047   GFRAA >60 09/30/2019 1047    BNP No results found for: "BNP"  ProBNP No results found for: "PROBNP"  Imaging: No results found.   Assessment & Plan:   Sarcoidosis - Patient was diagnosed with sarcoidosis via mediastinoscopy on 06/27/20.  CT chest in April 2023 showed decrease in prominence of lymphadenopathy and resolution of bilateral pulmonary nodules.  She had normal pulmonary function testing in June  2022.  She has been noticing more dyspnea symptoms with exertion and chest pain. She ran out of methotrexate 4 weeks ago. We will check routine CXR today along with CBC/CMET/ACE level and resume MXT at lower dose and increase to target dose 15mg  weekly. FU in 1 month.   OSA (obstructive sleep apnea) - Patient has a history of severe sleep apnea. Polysomnography in November 2021 >> AHI 48.9/hour with spo2 low 87% (baseline 97%).  She was intolerant to CPAP.  She is open to repeating sleep study.  We have placed an order for patient to have split-night sleep study.   Preventative health care - Patient is overdue for physical, advised patient reach out to her primary care to schedule  Atypical chest pain - Chronic in nature. She has been off methotrexate for the last month. Getting updated testing for sarcoid. Refer back to cardiology    Glenford Bayley, NP 07/31/2022

## 2022-07-31 ENCOUNTER — Ambulatory Visit (INDEPENDENT_AMBULATORY_CARE_PROVIDER_SITE_OTHER): Payer: BC Managed Care – PPO

## 2022-07-31 ENCOUNTER — Ambulatory Visit: Payer: BC Managed Care – PPO | Admitting: Primary Care

## 2022-07-31 ENCOUNTER — Encounter: Payer: Self-pay | Admitting: Primary Care

## 2022-07-31 VITALS — BP 130/80 | HR 94 | Temp 98.1°F | Ht 67.0 in | Wt 381.6 lb

## 2022-07-31 DIAGNOSIS — G4730 Sleep apnea, unspecified: Secondary | ICD-10-CM

## 2022-07-31 DIAGNOSIS — D869 Sarcoidosis, unspecified: Secondary | ICD-10-CM

## 2022-07-31 DIAGNOSIS — R0781 Pleurodynia: Secondary | ICD-10-CM

## 2022-07-31 DIAGNOSIS — R0789 Other chest pain: Secondary | ICD-10-CM

## 2022-07-31 DIAGNOSIS — G4733 Obstructive sleep apnea (adult) (pediatric): Secondary | ICD-10-CM

## 2022-07-31 DIAGNOSIS — R0602 Shortness of breath: Secondary | ICD-10-CM

## 2022-07-31 DIAGNOSIS — Z0000 Encounter for general adult medical examination without abnormal findings: Secondary | ICD-10-CM

## 2022-07-31 LAB — CBC
HCT: 42.7 % (ref 36.0–46.0)
Hemoglobin: 14.6 g/dL (ref 12.0–15.0)
MCHC: 34.3 g/dL (ref 30.0–36.0)
MCV: 87.5 fl (ref 78.0–100.0)
Platelets: 188 10*3/uL (ref 150.0–400.0)
RBC: 4.88 Mil/uL (ref 3.87–5.11)
RDW: 14 % (ref 11.5–15.5)
WBC: 7.9 10*3/uL (ref 4.0–10.5)

## 2022-07-31 LAB — COMPREHENSIVE METABOLIC PANEL
ALT: 28 U/L (ref 0–35)
AST: 54 U/L — ABNORMAL HIGH (ref 0–37)
Albumin: 4.2 g/dL (ref 3.5–5.2)
Alkaline Phosphatase: 82 U/L (ref 39–117)
BUN: 9 mg/dL (ref 6–23)
CO2: 25 mEq/L (ref 19–32)
Calcium: 9.4 mg/dL (ref 8.4–10.5)
Chloride: 101 mEq/L (ref 96–112)
Creatinine, Ser: 0.71 mg/dL (ref 0.40–1.20)
GFR: 112.53 mL/min (ref >60.00)
Glucose, Bld: 101 mg/dL — ABNORMAL HIGH (ref 70–99)
Potassium: 3.9 mEq/L (ref 3.5–5.1)
Sodium: 136 mEq/L (ref 135–145)
Total Bilirubin: 0.9 mg/dL (ref 0.2–1.2)
Total Protein: 7.8 g/dL (ref 6.0–8.3)

## 2022-07-31 NOTE — Assessment & Plan Note (Signed)
-   Patient has a history of severe sleep apnea. Polysomnography in November 2021 >> AHI 48.9/hour with spo2 low 87% (baseline 97%).  She was intolerant to CPAP.  She is open to repeating sleep study.  We have placed an order for patient to have split-night sleep study.

## 2022-07-31 NOTE — Assessment & Plan Note (Addendum)
-   Patient is overdue for physical, advised patient reach out to her primary care to schedule

## 2022-07-31 NOTE — Assessment & Plan Note (Addendum)
-   Patient was diagnosed with sarcoidosis via mediastinoscopy on 06/27/20.  CT chest in April 2023 showed decrease in prominence of lymphadenopathy and resolution of bilateral pulmonary nodules.  She had normal pulmonary function testing in June 2022.  She has been noticing more dyspnea symptoms with exertion and chest pain. She ran out of methotrexate 4 weeks ago. We will check routine CXR today along with CBC/CMET/ACE level and resume MXT at lower dose and increase to target dose 15mg  weekly. FU in 1 month.

## 2022-07-31 NOTE — Assessment & Plan Note (Signed)
-   Chronic in nature. She has been off methotrexate for the last month. Getting updated testing for sarcoid. Refer back to cardiology

## 2022-07-31 NOTE — Patient Instructions (Addendum)
Recommendations: We will restart methotrexate at lower dose and increase to target Sleep with head of bed elevated 30 degrees  Work on weight loss as able, do not drink alcohol/take sedating medication prior to bedtime  Please schedule physical with primary care  Orders: Sleep study in lab re: OSA (ordered)  Labs and CXR today (ordered)   Refer: Cardiology re: chest pain/palpitations (ordered)   Follow-up: 1 month with Dr. Francine Graven   Sarcoidosis  Sarcoidosis is a disease that can cause inflammation in many areas of the body. It most often affects the lungs (pulmonary sarcoidosis). Sarcoidosis can also affect the lymph nodes, liver, eyes, skin, heart, or any other body tissue. Normally, cells that are part of the body's disease-fighting system (immune system) attack harmful substances in the body, such as germs. This immune system response causes inflammation. After the harmful substance is destroyed, the inflammation goes away. When you have sarcoidosis, your immune system causes inflammation even when there are no harmful substances, and the inflammation does not go away. Sarcoidosis also causes cells from your immune system to form small lumps (granulomas) in the affected area of your body. What are the causes? The exact cause of sarcoidosis is not known.  If you have a family history of this disease (genetic predisposition), the immune system response that leads to inflammation may be triggered by something in your environment, such as: Bacteria or viruses. Metals. Chemicals. Dust. Mold or mildew. What increases the risk? You may be more likely to develop this condition if you: Have a family history of the disease. Are African American. Are of Northern European descent. Are 12-48 years old. Are female. Work as a IT sales professional. Work in an environment where you are exposed to metals, chemicals, mold or mildew, or insecticides. What are the signs or symptoms? Some people with  sarcoidosis have no symptoms. Others have very mild symptoms. The symptoms usually depend on the organ that is affected. Sarcoidosis most often affects the lungs, which may lead to symptoms such as: Chest pain. Coughing. Wheezing. Shortness of breath. Other common symptoms include: Night sweats. Fever. Weight loss. Tiredness (fatigue). Swollen lymph nodes. Joint pain. How is this diagnosed? This condition may be diagnosed based on: Your symptoms and medical history. A physical exam. Imaging tests such as: Chest X-ray. CT scan. MRI. PET scan. Lung function tests. These tests evaluate your breathing and check for problems that may be related to sarcoidosis. A procedure to remove a tissue sample for testing (biopsy). You may have a biopsy of lung tissue if that is where you are having symptoms. You may have tests to check for any complications of the condition. These tests may include: Eye exams. MRI of the heart or brain. Echocardiogram. ECG (electrocardiogram). How is this treated? In some cases, sarcoidosis does not require a specific treatment because it causes no symptoms or only mild symptoms. If your symptoms bother you or are severe, you may be prescribed medicines to reduce inflammation or relieve symptoms. These medicines may include: Prednisone. This is a steroid that reduces inflammation related to sarcoidosis. Hydroxychloroquine. This may be used to treat sarcoidosis that affects the skin, eyes, or brain. Certain medicines that affect the immune system. These can help with sarcoidosis in the joints, eyes, skin, or lungs. Medicines that you breathe in (inhalers). Inhalers can help you breathe if sarcoidosis affects your lungs. Follow these instructions at home:  Do not use any products that contain nicotine or tobacco. These products include cigarettes, chewing tobacco, and vaping  devices, such as e-cigarettes. If you need help quitting, ask your health care  provider. Avoid secondhand smoke and irritating dust or chemicals. Stay indoors on days when air quality is poor in your area. Return to your normal activities as told by your health care provider. Ask your health care provider what activities are safe for you. Take or use over-the-counter and prescription medicines only as told by your health care provider. Keep all follow-up visits. This is important. Where to find more information National Heart, Lung, and [employer] Institute: [employer]is Contact a health care provider if: You have vision problems. You have a dry cough that does not go away. You have an irregular heartbeat. You have pain or aches in your joints, hands, or feet. You have an unexplained rash. Get help right away if: You have chest pain. You have trouble breathing. These symptoms may represent a serious problem that is an emergency. Do not wait to see if the symptoms will go away. Get medical help right away. Call your local emergency services (911 in the [place] Do not drive yourself to the hospital. Summary Sarcoidosis is a disease that can cause inflammation in many body areas of the body. It most often affects the lungs (pulmonary sarcoidosis). It can also affect the lymph nodes, liver, eyes, skin, heart, or any other body tissue. When you have sarcoidosis, cells from your immune system form small lumps (granulomas) in the affected area of your body. Sarcoidosis sometimes does not require a specific treatment because it causes no symptoms or only mild symptoms. If your symptoms bother you or are severe, you may be prescribed medicines to reduce inflammation or relieve symptoms. This information is not intended to replace advice given to you by your health care provider. Make sure you discuss any questions you have with your health care provider. Document Revised: 01/19/2020 Document Reviewed: 01/19/2020 Elsevier Patient Education  2023 [employer].

## 2022-08-01 LAB — ANGIOTENSIN CONVERTING ENZYME: Angiotensin-Converting Enzyme: 68 U/L — ABNORMAL HIGH (ref 9–67)

## 2022-08-02 MED ORDER — METHOTREXATE SODIUM 2.5 MG PO TABS: ORAL_TABLET | ORAL | 5 refills | Status: DC

## 2022-08-02 NOTE — Addendum Note (Signed)
Addended by: Glenford Bayley on: 08/02/2022 09:11 AM   Modules accepted: Orders

## 2022-08-02 NOTE — Progress Notes (Signed)
Please let patient know ACE level was elevated. I have sent refill of her methotrexate.   Start methotrexate 2.5mg  2 tablets weekly (take medication the same day each week). After 2 weeks, if not experiencing side effects then increase to 4 tablets per week x 2 weeks; then increase to 6 tablets per week and stay on this dose

## 2022-08-15 NOTE — Progress Notes (Unsigned)
Cardiology Clinic Note   Date: 08/16/2022 ID: [name], DOB June 03, 1990, MRN [id number]  Primary Cardiologist:  Thomasene Ripple, DO  Patient Profile    Margaret Golden is a 32 y.o. female who presents to the clinic today for evaluation of chest tightness and palpitations.   Past medical history significant for: Palpitations/NSVT.  7-day ZIO 08/28/2019: Min HR 62 bpm, max HR 207 bpm, average HR 94 bpm.  Predominant underlying rhythm is sinus rhythm.  2 V. tach runs, fastest 4 beats with max rate 207 bpm, longest 4 beats average 117 bpm rare PACs/PVCs.  No SVT, pauses, AV block, A-fib.  All patient triggered/diary events associated with sinus rhythm. Chest tightness/shortness of breath: Echo 09/14/2019: EF 60 to 65%.  Trivial MR. Coronary CTA 11/23/2019: Coronary calcium score 0. Hypertension.  Migraines.  OSA.  GERD.  Hypothyroidism.  Anemia. Anxiety disorder. Pulmonary sarcoidosis. Confirmed with biopsy.  On methotrexate.   History of Present Illness    Margaret Golden was first evaluated by Dr. Servando Salina on 08/26/2019 with complaints of palpitations, shortness of breath and chest tightness.  She reported hospital admission January 2021 in which she was found to be anemic and received a blood transfusion.  She presented to the ED again at the end of March for shortness of breath and chest tightness.  She had an unremarkable workup at that time.  She continues to be followed for the above outlined history.  Patient was last seen in the office by Dr. Servando Salina on 12/08/2020.  For routine follow-up.  She was doing well at that time and no medication changes were made.  Today, patient is here alone.  She reports episodes of central chest tightness mostly with light activities such as getting out of bed or standing up from sitting or talking for long periods of time.  Pain is nonradiating and associated with palpitations described as racing.  Discomfort lasts minutes and resolves with sitting down  or lying down to rest.  She is also very concerned about palpitations and wonders if she has POTS as she feels as though her heart races and she notices when she is in the shower and raises her hands above her head she has palpitations as well.  Chest tightness and palpitations always occur together but it varies which symptoms come first.  She has dyspnea with exertion talking.  Complete rest such as sitting down to read a book will resolve shortness of breath.  She notes that she ran out of methotrexate for about 5 weeks and is currently restarting titration to full dose.  She reports edema bilateral feet and ankles that is normal in the a.m. and increases throughout the day.  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  Discussed compression socks, however that may be difficult secondary to body habitus.  She is going to talk to her dad about the kind he uses.  Discussed with patient that there may be many elements going on at once with her.  There is consideration of her morbid obesity and deconditioned state as well as her pulmonary sarcoidosis that was untreated for 5 weeks until recently starting retitration of methotrexate.  She does admit chest tightness and palpitations were definitely more intense when she was off her medicine.    ROS: All other systems reviewed and are otherwise negative except as noted in History of Present Illness.  Studies Reviewed    ECG personally reviewed  by me today: NSR, 94 bpm.  No significant changes from 09/20/2021.   Physical Exam    VS:  BP 120/70 (BP Location: Right Arm, Patient Position: Sitting, Cuff Size: Large)   Pulse 94   Ht 5\' 7"  (1.702 m)   Wt (!) 389 lb 9.6 oz (176.7 kg)   BMI 61.02 kg/m  , BMI Body mass index is 61.02 kg/m.  GEN: Well nourished, well developed, in no acute distress. Neck: No JVD or carotid bruits. Cardiac:  RRR. No murmurs. No rubs or gallops.   Respiratory:  Respirations  regular and unlabored. Clear to auscultation without rales, wheezing or rhonchi. GI: Soft, nontender, nondistended. Extremities: Radials/DP/PT 2+ and equal bilaterally. No clubbing or cyanosis. No edema appreciated but difficult to assess secondary to body habitus.   Skin: Warm and dry, no rash. Neuro: Strength intact.  Assessment & Plan    Palpitations/NSVT/chest tightness/DOE.  7-day ZIO May 2021 showed 2 runs of NSVT.  Patient reports increased palpitations described as racing with any activity and raising her hands above her head.  She is concerned she may have POTS.  She does report associated chest tightness also occurring with very light activity such as getting out of bed or standing up from sitting.  It varies if chest tightness comes first or palpitations but they always occur together.  Symptoms last for minutes and resolve with laying down or sitting to rest.  Also reports shortness of breath with activity talking for long periods of time.  The shortness of breath does not always occur with chest tightness and palpitations.  Complete rest such as sitting and reading a book will resolve symptoms. She recently ran out of methotrexate treating her sarcoidosis for about 5 weeks.  She was recently seen by pulmonology and restarted on titration of medication.  She admits chest tightness and palpitations increased in intensity when she was off the medication.  Given symptoms are essentially unchanged from when she was first evaluated with increase in intensity in the setting of untreated pulmonary sarcoidosis for 5 weeks and current up titration of methotrexate will defer ischemic evaluation. Will repeat 2-week ZIO for evaluation of palpitations and obtain echo for evaluation of DOE.   Continue propranolol. Hypertension. BP today 120/70.   Continue propranolol.  Disposition: 2-week ZIO for palpitations.  Echo for DOE.  Return in 8-10 weeks or sooner as needed.         Signed, Etta Grandchild.  Margaret Valencia, DNP, NP-C

## 2022-08-16 ENCOUNTER — Encounter: Payer: Self-pay | Admitting: Student

## 2022-08-16 ENCOUNTER — Ambulatory Visit: Payer: BC Managed Care – PPO | Attending: Internal Medicine | Admitting: Student

## 2022-08-16 ENCOUNTER — Ambulatory Visit (INDEPENDENT_AMBULATORY_CARE_PROVIDER_SITE_OTHER): Payer: BC Managed Care – PPO

## 2022-08-16 VITALS — BP 120/70 | HR 94 | Ht 67.0 in | Wt 389.6 lb

## 2022-08-16 DIAGNOSIS — R0609 Other forms of dyspnea: Secondary | ICD-10-CM

## 2022-08-16 DIAGNOSIS — I10 Essential (primary) hypertension: Secondary | ICD-10-CM

## 2022-08-16 DIAGNOSIS — R0789 Other chest pain: Secondary | ICD-10-CM

## 2022-08-16 DIAGNOSIS — R002 Palpitations: Secondary | ICD-10-CM

## 2022-08-16 NOTE — Progress Notes (Unsigned)
Enrolled patient for a 14 day Zio XT monitor to be mailed to patients home   Dr Tobb to read 

## 2022-08-16 NOTE — Patient Instructions (Signed)
Medication Instructions:  Your physician recommends that you continue on your current medications as directed. Please refer to the Current Medication list given to you today.  *If you need a refill on your cardiac medications before your next appointment, please call your pharmacy*   Lab Work: NONE If you have labs (blood work) drawn today and your tests are completely normal, you will receive your results only by: MyChart Message (if you have MyChart) OR A paper copy in the mail If you have any lab test that is abnormal or we need to change your treatment, we will call you to review the results.   Testing/Procedures:  Your physician has requested that you have an echocardiogram. Echocardiography is a painless test that uses sound waves to create images of your heart. It provides your doctor with information about the size and shape of your heart and how well your heart's chambers and valves are working. This procedure takes approximately one hour. There are no restrictions for this procedure. Please do NOT wear cologne, perfume, aftershave, or lotions (deodorant is allowed). Please arrive 15 minutes prior to your appointment time.  ZIO XT- Long Term Monitor Instructions  Your physician has requested you wear a ZIO patch monitor for 14 days.  This is a single patch monitor. [employer] supplies one patch monitor per enrollment. Additional stickers are not available. Please do not apply patch if you will be having a Nuclear Stress Test,  Echocardiogram, Cardiac CT, MRI, or Chest Xray during the period you would be wearing the  monitor. The patch cannot be worn during these tests. You cannot remove and re-apply the  ZIO XT patch monitor.  Your ZIO patch monitor will be mailed 3 day USPS to your address on file. It may take 3-5 days  to receive your monitor after you have been enrolled.  Once you have received your monitor, please review the enclosed instructions. Your monitor  has already  been registered assigning a specific monitor serial # to you.  Billing and Patient Assistance Program Information  We have supplied [employer] with any of your insurance information on file for billing purposes. [employer] offers a sliding scale Patient Assistance Program for patients that do not have  insurance, or whose insurance does not completely cover the cost of the ZIO monitor.  You must apply for the Patient Assistance Program to qualify for this discounted rate.  To apply, please call [employer] at [phone], select option 4, select option 2, ask to apply for  Patient Assistance Program. Meredeth Ide will ask your household income, and how many people  are in your household. They will quote your out-of-pocket cost based on that information.  [employer] will also be able to set up a 50-month, interest-free payment plan if needed.  Applying the monitor   Shave hair from upper left chest.  Hold abrader disc by orange tab. Rub abrader in 40 strokes over the upper left chest as  indicated in your monitor instructions.  Clean area with 4 enclosed alcohol pads. Let dry.  Apply patch as indicated in monitor instructions. Patch will be placed under collarbone on left  side of chest with arrow pointing upward.  Rub patch adhesive wings for 2 minutes. Remove white label marked "1". Remove the white  label marked "2". Rub patch adhesive wings for 2 additional minutes.  While looking in a mirror, press and release button in center of patch. A small green light will  flash 3-4 times. This will be your only indicator  that the monitor has been turned on.  Do not shower for the first 24 hours. You may shower after the first 24 hours.  Press the button if you feel a symptom. You will hear a small click. Record Date, Time and  Symptom in the Patient Logbook.  When you are ready to remove the patch, follow instructions on the last 2 pages of Patient  Logbook. Stick patch monitor onto the last page of Patient  Logbook.  Place Patient Logbook in the blue and white box. Use locking tab on box and tape box closed  securely. The blue and white box has prepaid postage on it. Please place it in the mailbox as  soon as possible. Your physician should have your test results approximately 7 days after the  monitor has been mailed back to [facility].  Call [facility] Customer Care at [phone] if you have questions regarding  your ZIO XT patch monitor. Call them immediately if you see an orange light blinking on your  monitor.  If your monitor falls off in less than 4 days, contact our Monitor department at [phone].  If your monitor becomes loose or falls off after 4 days call [employer] at [phone] for  suggestions on securing your monitor    Follow-Up: At [facility], you and your health needs are our priority.  As part of our continuing mission to provide you with exceptional heart care, we have created designated Provider Care Teams.  These Care Teams include your primary Cardiologist (physician) and Advanced Practice Providers (APPs -  Physician Assistants and Nurse Practitioners) who all work together to provide you with the care you need, when you need it.  We recommend signing up for the patient portal called "MyChart".  Sign up information is provided on this After Visit Summary.  MyChart is used to connect with patients for Virtual Visits (Telemedicine).  Patients are able to view lab/test results, encounter notes, upcoming appointments, etc.  Non-urgent messages can be sent to your provider as well.   To learn more about what you can do with MyChart, go to [email or web address].    Your next appointment:   8-10 week(s)  Provider:   Carlos Levering, NP

## 2022-08-19 DIAGNOSIS — R002 Palpitations: Secondary | ICD-10-CM | POA: Diagnosis not present

## 2022-08-20 DIAGNOSIS — F4323 Adjustment disorder with mixed anxiety and depressed mood: Secondary | ICD-10-CM | POA: Diagnosis not present

## 2022-08-25 ENCOUNTER — Other Ambulatory Visit: Payer: Self-pay | Admitting: Psychiatry

## 2022-08-25 DIAGNOSIS — F339 Major depressive disorder, recurrent, unspecified: Secondary | ICD-10-CM

## 2022-08-25 DIAGNOSIS — F4010 Social phobia, unspecified: Secondary | ICD-10-CM

## 2022-08-28 DIAGNOSIS — F4323 Adjustment disorder with mixed anxiety and depressed mood: Secondary | ICD-10-CM | POA: Diagnosis not present

## 2022-08-31 ENCOUNTER — Ambulatory Visit: Payer: BC Managed Care – PPO | Admitting: Pulmonary Disease

## 2022-08-31 ENCOUNTER — Encounter: Payer: Self-pay | Admitting: Pulmonary Disease

## 2022-08-31 DIAGNOSIS — D869 Sarcoidosis, unspecified: Secondary | ICD-10-CM

## 2022-08-31 MED ORDER — FOLIC ACID 1 MG PO TABS: 2.0000 mg | ORAL_TABLET | Freq: Every day | ORAL | 6 refills | Status: DC

## 2022-08-31 NOTE — Progress Notes (Signed)
Synopsis: Referred in 12/2019 by Margaret Craze, NP for mediastinal adenopathy  Subjective:   PATIENT ID: Margaret Golden GENDER: female DOB: 10/14/1990, MRN: [id number]  HPI  Chief Complaint  Patient presents with   Follow-up    4 wk f/u after restarting the methotrexate.    Margaret Golden is a 32 year old woman, non-smoker with history of anemia, hypertension and obesity who returns to pulmonary clinic for follow up of sarcoidosis.    She was seen by Margaret Golden 10/11/21 and 07/31/22 for follow up. She was ordered for split night sleep study at last visit. She has been resumed on methotrexate after missing her doses for 4-5 weeks. She continues to have sluggish feeling and brain fog after taking her weekly doses. Otherwise she is doing ok.  OV 05/2021 She continues on 15mg  methotrexate weekly since last fall. She denies any improvement in her chest pains or dyspnea. No updated lab work since 01/2021. She has gained 14lbs since last visit. She complains of feeling sluggish and having brain fog for 2-3 days after taking her weekly dose of methotrexate.   She is not using CPAP.   OV 08/2020 She was started on methotrexate 5mg  weekly at last visit. She has tolerated the medication well thus far. She does not notice any change in her dyspnea on exertion. She continues to have intermittent chest pains.   Pulmonary function tests are normal overall but they show increased diffusion capacity.   OV 07/26/20 She developed chest discomfort and shortness of breath about 1 year ago. She was hospitalized for symptomatic anemia due to heavy menses. Her hemoglobin has remained in the normal range since but she continued to have the above symptoms. She was evaluated by Cardiology prior to hysteroscopy who performed an ECHO which was normal and a CTA coronary scan which showed mediastinal adenopathy and pulmonary nodules.   We have followed her thoracic adenopathy with serial CT chest scans which  demonstrated progression of the adenopathy prompting a PET scan on 04/27/20 which demonstrated increased uptake in her lymph nodes and also the right upper lobe nodule. She was taken for EBUS on 03/31/20 with sampling of the station 7 lymph node with non-diagnostic results. She was then referred to Thoracic Surgery who performed mediastinoscopy on 06/27/20 with lymph node samples  with non-caseating granulomas consistent with sarcoidosis.     She denies any vision changes, joint pains or skin rashes. She continues to have exertional dyspnea, chest pressure and has started to wake up short of breath at night at times.   No family members with sarcoidosis.   Past Medical History:  Diagnosis Date   Acid reflux    ADD (attention deficit disorder)    Anemia    Anxiety    Arthritis    left ankle   Back pain    Chest pain    Complication of anesthesia    Depression    Dyspnea    with exertion   Exercise-induced asthma    as a child   Fatty liver    History of blood transfusion    History of chicken pox    History of kidney stones    passed    Hypothyroidism    Joint pain    Lower extremity edema    Major depressive disorder    Migraines    chronic   Palpitations    Pneumonia    x 2   PONV (postoperative nausea and vomiting)    nausea  RLS (restless legs syndrome)    Sleep apnea 2021   Thyroid disease    hypothyroid     Family History  Problem Relation Age of Onset   Depression Mother    Obesity Mother        died from "an infection" ? MRSA   Depression Father    Sleep apnea Father    Obesity Father    Arthritis Maternal Grandmother    Breast cancer Maternal Grandmother        in remission   Diabetes Maternal Grandfather    Arthritis Maternal Grandfather    Arthritis Paternal Grandmother    Arthritis Paternal Grandfather    Cancer Paternal Aunt        colon   Cancer Paternal Uncle        brain cancer   Migraines Neg Hx      Social History   Socioeconomic  History   Marital status: Single    Spouse name: Not on file   Number of children: 0   Years of education: 12   Highest education level: Not on file  Occupational History   Occupation: Conservation officer, historic buildings  Tobacco Use   Smoking status: Never    Passive exposure: Never   Smokeless tobacco: Never  Vaping Use   Vaping Use: Never used  Substance and Sexual Activity   Alcohol use: Not Currently    Comment: rare social drink maybe once "every 5 blue moons"   Drug use: No   Sexual activity: Never    Comment: never sexually active  Other Topics Concern   Not on file  Social History Narrative   Unemployed   Lives alone --update 11/11/19   Seeking work   Only child    Lives in a one story house with father, has a cat-12/26/16-sjb      Caffeine: 2-4 cups/day   Social Determinants of Corporate investment banker Strain: Not on file  Food Insecurity: Not on file  Transportation Needs: Not on file  Physical Activity: Not on file  Stress: Not on file  Social Connections: Not on file  Intimate Partner Violence: Not on file     Allergies  Allergen Reactions   Augmentin [Amoxicillin-Pot Clavulanate] Hives     Outpatient Medications Prior to Visit  Medication Sig Dispense Refill   albuterol (VENTOLIN HFA) 108 (90 Base) MCG/ACT inhaler Inhale 2 puffs into the lungs every 6 (six) hours as needed for wheezing or shortness of breath. 8 g 2   aspirin-acetaminophen-caffeine (EXCEDRIN MIGRAINE) 250-250-65 MG tablet Take 2 tablets by mouth daily as needed for headache or migraine.     diphenhydrAMINE HCl, Sleep, (ZZZQUIL PO) Take by mouth at bedtime as needed.     furosemide (LASIX) 20 MG tablet TAKE 1 TABLET BY MOUTH TWO TIMES A WEEK 26 tablet 5   gabapentin (NEURONTIN) 300 MG capsule Take 1 capsule (300 mg total) by mouth at bedtime. 90 capsule 1   Iron-FA-B Cmp-C-Biot-Probiotic (FUSION PLUS) CAPS One capsule twice daily with orange juice. Stop your Ferrous sulfate. 30 capsule 1    levonorgestrel (MIRENA, 52 MG,) 20 MCG/DAY IUD 1 each by Intrauterine route once.     levothyroxine (SYNTHROID) 100 MCG tablet TAKE 1 TABLET BY MOUTH DAILY WITH 137 MCG TABLET 90 tablet 1   levothyroxine (SYNTHROID) 137 MCG tablet TAKE 1 TABLET BY MOUTH DAILY BEFORE BREAKFAST WITH 100 MCG TABLET 90 tablet 1   LORazepam (ATIVAN) 0.5 MG tablet Take 1 tablet (0.5 mg total) by  mouth every 8 (eight) hours as needed for anxiety. 30 tablet 1   lurasidone (LATUDA) 40 MG TABS tablet Take 1 tablet (40 mg total) by mouth daily with supper. Take 1/2 tablet daily with supper for one week, then increase to 1 tablet daily 90 tablet 1   methocarbamol (ROBAXIN) 500 MG tablet Take 1 tablet (500 mg total) by mouth every 8 (eight) hours as needed for muscle spasms. 20 tablet 0   methotrexate (RHEUMATREX) 2.5 MG tablet TAKE 6 TABLETS BY MOUTH ONCE WEEKLY PROTECT FROM LIGHT 24 tablet 5   methylphenidate (RITALIN) 10 MG tablet Take 1/2-1 tab po BID prn 60 tablet 0   methylphenidate (RITALIN) 10 MG tablet Take 1/2-1 tab po BID prn 60 tablet 0   Multiple Vitamins-Minerals (MULTIVITAMIN WITH MINERALS) tablet Take 1 tablet by mouth daily.     nystatin cream (MYCOSTATIN) Apply 1 Application topically 2 (two) times daily. Apply to affected area BID for up to 7 days. 30 g 1   omeprazole (PRILOSEC) 20 MG capsule TAKE 2 CAPSULES BY MOUTH DAILY 180 capsule `   ondansetron (ZOFRAN) 4 MG tablet Take 1 tablet (4 mg total) by mouth every 8 (eight) hours as needed for nausea or vomiting. 20 tablet 0   potassium chloride SA (KLOR-CON) 20 MEQ tablet Take 1 tablet (20 mEq total) by mouth 2 (two) times a week. 26 tablet 0   propranolol (INDERAL) 20 MG tablet Take 1 tablet (20 mg total) by mouth 3 (three) times daily. (Patient taking differently: Take 20 mg by mouth 2 (two) times daily.) 270 tablet 3   Semaglutide-Weight Management (WEGOVY) 0.25 MG/0.5ML SOAJ Inject 0.25 mg into the skin once a week. 2 mL 0   sertraline (ZOLOFT) 100 MG tablet  TAKE 2 TABLETS BY MOUTH DAILY 60 tablet 0   topiramate (TOPAMAX) 50 MG tablet Start with 50mg (1 tab) at bedtime. In 1 week increase to 100mg (2 tabs) at bedtime. 180 tablet 6   Vitamin D, Ergocalciferol, (DRISDOL) 1.25 MG (50000 UNIT) CAPS capsule Take 1 capsule (50,000 Units total) by mouth 2 (two) times a week. 12 capsule 0   folic acid (FOLVITE) 1 MG tablet Take 1 tablet (1 mg total) by mouth daily. 60 tablet 6   No facility-administered medications prior to visit.    Review of Systems  Constitutional:  Positive for malaise/fatigue. Negative for chills, fever and weight loss.  HENT:  Negative for congestion and sore throat.   Respiratory:  Positive for shortness of breath. Negative for cough, hemoptysis, sputum production and wheezing.   Cardiovascular:  Positive for chest pain. Negative for palpitations, orthopnea, claudication, leg swelling and PND.  Gastrointestinal:  Negative for abdominal pain, heartburn and vomiting.  Genitourinary: Negative.   Musculoskeletal:  Negative for joint pain and myalgias.  Neurological:  Negative for dizziness, focal weakness and headaches.  Endo/Heme/Allergies: Negative.   Psychiatric/Behavioral: Negative.      Objective:   Vitals:   08/31/22 1521  BP: 122/76  Pulse: 90  SpO2: 99%  Weight: (!) 382 lb (173.3 kg)  Height: 5\' 7"  (1.702 m)     Physical Exam Constitutional:      General: She is not in acute distress.    Appearance: She is obese. She is not ill-appearing.  HENT:     Head: Normocephalic and atraumatic.  Eyes:     General: No scleral icterus.    Conjunctiva/sclera: Conjunctivae normal.  Cardiovascular:     Rate and Rhythm: Normal rate and regular rhythm.  Pulses: Normal pulses.     Heart sounds: Normal heart sounds. No murmur heard. Pulmonary:     Effort: Pulmonary effort is normal.     Breath sounds: No decreased breath sounds, wheezing, rhonchi or rales.  Musculoskeletal:     Right lower leg: No edema.     Left  lower leg: No edema.  Skin:    General: Skin is warm and dry.  Neurological:     General: No focal deficit present.     Mental Status: She is alert.    CBC    Component Value Date/Time   WBC 7.9 07/31/2022 1501   RBC 4.88 07/31/2022 1501   HGB 14.6 07/31/2022 1501   HGB 13.4 02/14/2022 1253   HGB 9.3 (L) 08/26/2019 1045   HCT 42.7 07/31/2022 1501   HCT 33.4 (L) 08/26/2019 1045   PLT 188.0 07/31/2022 1501   PLT 128 (L) 02/14/2022 1253   PLT 234 08/26/2019 1045   MCV 87.5 07/31/2022 1501   MCV 72 (L) 08/26/2019 1045   MCH 29.6 02/14/2022 1253   MCHC 34.3 07/31/2022 1501   RDW 14.0 07/31/2022 1501   RDW 15.4 08/26/2019 1045   LYMPHSABS 1.0 06/20/2022 1613   MONOABS 0.3 06/20/2022 1613   EOSABS 0.2 06/20/2022 1613   BASOSABS 0.0 06/20/2022 1613      Latest Ref Rng & Units 07/31/2022    3:01 PM 06/20/2022    4:13 PM 09/28/2021   10:52 AM  BMP  Glucose 70 - 99 mg/dL 161  94    BUN 6 - 23 mg/dL 9  12    Creatinine 0.96 - 1.20 mg/dL 0.45  4.09    Sodium 811 - 145 mEq/L 136  140    Potassium 3.5 - 5.1 mEq/L 3.9  4.1    Chloride 96 - 112 mEq/L 101  105    CO2 19 - 32 mEq/L 25  26    Calcium 8.4 - 10.5 mg/dL 9.4  8.8  8.9    Chest imaging: CT Chest 07/03/21 1. Significant improvement in the mediastinal lymphadenopathy seen previously, with index lesions described above. No pathologic adenopathy identified on this exam. Please note limitations for evaluation of hilar adenopathy without IV contrast. 2. Interval resolution of the bilateral pulmonary nodules. No new nodules or masses. 3. Stable splenomegaly, only partially visualized on this study due to slice selection. 4. Small hiatal hernia.  PET Scan 04/27/20 Multifocal hypermetabolic nodal activity throughout the mediastinum, hilar regions and porta hepatis. Multiple nonspecific small pulmonary nodules are again noted bilaterally, stable from recent studies, but also new from baseline exam. The largest nodule in the  right upper lobe is hypermetabolic  CT Chest w contrast 91/47/82 Mild interval progression of thoracic adenopathy. Numerous pulmonary nodules that remain stable  CTA 12/17/19 Mediastinal and hilar adenopathy similar to scan on 11/23/19 Stable scattered pulmonary nodules  CTA Coronary 11/23/19 Enlarged subcarinal node. Bilateral hilar and infrahilar boderlinr to mild adenopathy. Bilateral pulmonary nodules, subpleural distribution.   CTA Chest 06/29/19 Mediastinum/Nodes: No enlarged mediastinal, hilar, or axillary lymph nodes. Thyroid gland, trachea, and esophagus demonstrate no significant findings. Lungs/Pleura: No acute pleural or parenchymal lung disease. Central airways are widely patent.  PFT:    Latest Ref Rng & Units 09/27/2020   12:58 PM  PFT Results  FVC-Pre L 4.60   FVC-Predicted Pre % 110   FVC-Post L 4.45   FVC-Predicted Post % 107   Pre FEV1/FVC % % 78   Post FEV1/FCV % %  79   FEV1-Pre L 3.60   FEV1-Predicted Pre % 103   FEV1-Post L 3.54   DLCO uncorrected ml/min/mmHg 30.14   DLCO UNC% % 121   DLCO corrected ml/min/mmHg 29.61   DLCO COR %Predicted % 119   DLVA Predicted % 109   PFT 2022: Normal Spirometry and lung volumes, increased diffusion  Labs: 07/26/20 - Hep B & C negative  Path: 06/27/20 - Surgical Path: LN 7, 10R show well formed granulomas concerning for sarcoidosis. Flow pathology negative.  Echo 09/14/19: EF 60-65%, Normal LV function. RV systolic function is normal. No valvular abnormalities.   Sleep Study: 01/2016 - AHI 3.5/hr -No central sleep apneas - Minimal to no oxygen desaturations    Assessment & Plan:   Sarcoidosis - Plan: folic acid (FOLVITE) 1 MG tablet  Discussion: Treva Nase is a 32 year old woman, non-smoker with history of anemia, hypertension and obesity who returns to pulmonary clinic for chest discomfort, shortness of breath and mediastinal lymphadenopathy found to have sarcoidosis via mediastinoscopy on 06/27/20.    She is to continue methotrexate therapy with goal of 15mg  weekly. She is to continue uptitrating her weekly dose back to baseline. She is to take 2mg  daily of folic acid and monitor for reduction in her side effects the days after taking her dose. If she continues to have issues with oral methotrexate we could consider injectable methotrexate if this would have less side effect profile.   Follow up in 6 months.   Melody Comas, MD [facility] Office: [phone]   Current Outpatient Medications:    albuterol (VENTOLIN HFA) 108 (90 Base) MCG/ACT inhaler, Inhale 2 puffs into the lungs every 6 (six) hours as needed for wheezing or shortness of breath., Disp: 8 g, Rfl: 2   aspirin-acetaminophen-caffeine (EXCEDRIN MIGRAINE) 250-250-65 MG tablet, Take 2 tablets by mouth daily as needed for headache or migraine., Disp: , Rfl:    diphenhydrAMINE HCl, Sleep, (ZZZQUIL PO), Take by mouth at bedtime as needed., Disp: , Rfl:    furosemide (LASIX) 20 MG tablet, TAKE 1 TABLET BY MOUTH TWO TIMES A WEEK, Disp: 26 tablet, Rfl: 5   gabapentin (NEURONTIN) 300 MG capsule, Take 1 capsule (300 mg total) by mouth at bedtime., Disp: 90 capsule, Rfl: 1   Iron-FA-B Cmp-C-Biot-Probiotic (FUSION PLUS) CAPS, One capsule twice daily with orange juice. Stop your Ferrous sulfate., Disp: 30 capsule, Rfl: 1   levonorgestrel (MIRENA, 52 MG,) 20 MCG/DAY IUD, 1 each by Intrauterine route once., Disp: , Rfl:    levothyroxine (SYNTHROID) 100 MCG tablet, TAKE 1 TABLET BY MOUTH DAILY WITH 137 MCG TABLET, Disp: 90 tablet, Rfl: 1   levothyroxine (SYNTHROID) 137 MCG tablet, TAKE 1 TABLET BY MOUTH DAILY BEFORE BREAKFAST WITH 100 MCG TABLET, Disp: 90 tablet, Rfl: 1   LORazepam (ATIVAN) 0.5 MG tablet, Take 1 tablet (0.5 mg total) by mouth every 8 (eight) hours as needed for anxiety., Disp: 30 tablet, Rfl: 1   lurasidone (LATUDA) 40 MG TABS tablet, Take 1 tablet (40 mg total) by mouth daily with supper. Take 1/2  tablet daily with supper for one week, then increase to 1 tablet daily, Disp: 90 tablet, Rfl: 1   methocarbamol (ROBAXIN) 500 MG tablet, Take 1 tablet (500 mg total) by mouth every 8 (eight) hours as needed for muscle spasms., Disp: 20 tablet, Rfl: 0   methotrexate (RHEUMATREX) 2.5 MG tablet, TAKE 6 TABLETS BY MOUTH ONCE WEEKLY PROTECT FROM LIGHT, Disp: 24 tablet, Rfl: 5  methylphenidate (RITALIN) 10 MG tablet, Take 1/2-1 tab po BID prn, Disp: 60 tablet, Rfl: 0   methylphenidate (RITALIN) 10 MG tablet, Take 1/2-1 tab po BID prn, Disp: 60 tablet, Rfl: 0   Multiple Vitamins-Minerals (MULTIVITAMIN WITH MINERALS) tablet, Take 1 tablet by mouth daily., Disp: , Rfl:    nystatin cream (MYCOSTATIN), Apply 1 Application topically 2 (two) times daily. Apply to affected area BID for up to 7 days., Disp: 30 g, Rfl: 1   omeprazole (PRILOSEC) 20 MG capsule, TAKE 2 CAPSULES BY MOUTH DAILY, Disp: 180 capsule, Rfl: `   ondansetron (ZOFRAN) 4 MG tablet, Take 1 tablet (4 mg total) by mouth every 8 (eight) hours as needed for nausea or vomiting., Disp: 20 tablet, Rfl: 0   potassium chloride SA (KLOR-CON) 20 MEQ tablet, Take 1 tablet (20 mEq total) by mouth 2 (two) times a week., Disp: 26 tablet, Rfl: 0   propranolol (INDERAL) 20 MG tablet, Take 1 tablet (20 mg total) by mouth 3 (three) times daily. (Patient taking differently: Take 20 mg by mouth 2 (two) times daily.), Disp: 270 tablet, Rfl: 3   Semaglutide-Weight Management (WEGOVY) 0.25 MG/0.5ML SOAJ, Inject 0.25 mg into the skin once a week., Disp: 2 mL, Rfl: 0   sertraline (ZOLOFT) 100 MG tablet, TAKE 2 TABLETS BY MOUTH DAILY, Disp: 60 tablet, Rfl: 0   topiramate (TOPAMAX) 50 MG tablet, Start with 50mg (1 tab) at bedtime. In 1 week increase to 100mg (2 tabs) at bedtime., Disp: 180 tablet, Rfl: 6   Vitamin D, Ergocalciferol, (DRISDOL) 1.25 MG (50000 UNIT) CAPS capsule, Take 1 capsule (50,000 Units total) by mouth 2 (two) times a week., Disp: 12 capsule, Rfl: 0   folic  acid (FOLVITE) 1 MG tablet, Take 2 tablets (2 mg total) by mouth daily., Disp: 60 tablet, Rfl: 6

## 2022-08-31 NOTE — Patient Instructions (Addendum)
Take 2mg  of folic acid daily and monitor for any changes with the side effects on the days you take the methotrexate  Continue methotrexate weekly, goal dose 15mg  per week  Continue albuterol inhaler 1-2 puffs every 4-6 hours as needed  Follow up in 6 months

## 2022-09-03 DIAGNOSIS — F4323 Adjustment disorder with mixed anxiety and depressed mood: Secondary | ICD-10-CM | POA: Diagnosis not present

## 2022-09-07 ENCOUNTER — Ambulatory Visit: Payer: BC Managed Care – PPO | Admitting: Internal Medicine

## 2022-09-07 DIAGNOSIS — R002 Palpitations: Secondary | ICD-10-CM | POA: Diagnosis not present

## 2022-09-10 DIAGNOSIS — F4323 Adjustment disorder with mixed anxiety and depressed mood: Secondary | ICD-10-CM | POA: Diagnosis not present

## 2022-09-17 DIAGNOSIS — F4323 Adjustment disorder with mixed anxiety and depressed mood: Secondary | ICD-10-CM | POA: Diagnosis not present

## 2022-09-19 ENCOUNTER — Ambulatory Visit (HOSPITAL_COMMUNITY): Payer: BC Managed Care – PPO | Attending: Cardiology

## 2022-09-19 DIAGNOSIS — R0609 Other forms of dyspnea: Secondary | ICD-10-CM | POA: Insufficient documentation

## 2022-09-19 LAB — ECHOCARDIOGRAM COMPLETE
Area-P 1/2: 4.21 cm2
S' Lateral: 2.6 cm

## 2022-09-19 MED ORDER — PERFLUTREN LIPID MICROSPHERE
1.0000 mL | INTRAVENOUS | Status: AC | PRN
Start: 2022-09-19 — End: 2022-09-19
  Administered 2022-09-19: 2 mL via INTRAVENOUS

## 2022-09-25 ENCOUNTER — Ambulatory Visit (INDEPENDENT_AMBULATORY_CARE_PROVIDER_SITE_OTHER): Payer: BC Managed Care – PPO | Admitting: Psychiatry

## 2022-09-25 ENCOUNTER — Encounter: Payer: Self-pay | Admitting: Psychiatry

## 2022-09-25 DIAGNOSIS — F4010 Social phobia, unspecified: Secondary | ICD-10-CM

## 2022-09-25 DIAGNOSIS — G2581 Restless legs syndrome: Secondary | ICD-10-CM

## 2022-09-25 DIAGNOSIS — F339 Major depressive disorder, recurrent, unspecified: Secondary | ICD-10-CM

## 2022-09-25 DIAGNOSIS — F4323 Adjustment disorder with mixed anxiety and depressed mood: Secondary | ICD-10-CM | POA: Diagnosis not present

## 2022-09-25 MED ORDER — LORAZEPAM 0.5 MG PO TABS
0.5000 mg | ORAL_TABLET | Freq: Three times a day (TID) | ORAL | 1 refills | Status: DC | PRN
Start: 2022-09-25 — End: 2023-03-05

## 2022-09-25 MED ORDER — GABAPENTIN 300 MG PO CAPS
300.0000 mg | ORAL_CAPSULE | Freq: Every day | ORAL | 1 refills | Status: DC
Start: 2022-09-25 — End: 2022-12-25

## 2022-09-25 MED ORDER — SERTRALINE HCL 100 MG PO TABS
200.0000 mg | ORAL_TABLET | Freq: Every day | ORAL | 1 refills | Status: DC
Start: 2022-09-25 — End: 2023-03-05

## 2022-09-25 NOTE — Progress Notes (Signed)
Margaret Golden [id number] [birth year] 31 y.o.  Subjective:   Patient ID:  Margaret Golden is a 32 y.o. (DOB [birth year]) female.  Chief Complaint:  Chief Complaint  Patient presents with   Anxiety   Depression    HPI Margaret Golden presents to the office today for follow-up of anxiety, depression, ADHD, and insomnia. She reports that things have been "Up and down" with her health and other stressors. She reports that she has had shortness of breath and has had to see specialists and have some tests completed. She reports worry and rumination about her health. She reports some possible occasional dissociation- "I've been somewhat in control of dissociating and zoning out" and is not dissociating when away from home or when doing stressful. She reports some sad mood or mood is "neutral or numb." She reports some irritablity. Energy and motivation have been low. She reports "exhaustion." Difficulty falling asleep. She reports that she typically stays asleep once she falls asleep. She reports that she has had more days recently where she feels like she is "not hungry enough" and other times she is hungry but food does not look appealing. She notices difficulty with "brain fog." She reports that she is periodically taking Ritalin and feels that it helps with brain fog. Denies SI.   She reports some "good things," to include interaction with friends and this has been positive.   Father is doing ok and has recovered from knee replacement surgery. Has not see father recently.   She has not taken Lorazepam prn recently.   She reports that she is "striving to do better" with taking medications consistently and has been taking medications regularly.   Seeing therapist weekly.   Past medication trials: Wellbutrin- initially helpful and then was less helpful Lexapro-ineffective Cymbalta Pristiq Seroquel- does not recall any significant improvement Latuda Abilify Propranolol- prescribed for  HR. Not helpful for anxiety Melatonin Vyvanse Adderall XR- not effective Concerta- Notices duration only lasts about 4 hours. No significant improvement with doses less than 54 mg.  [name]- May have caused n/v and poor concentration Horizant Lamictal-effective for mood Trazodone-effective for insomnia but causes some excessive drowsiness Rexulti- May have caused n/v and poor concentration Ativan- helpful Armodafinil- jaw locked  AIMS    Flowsheet Row Office Visit from 05/25/2022 in [facility] Office Visit from 09/28/2021 in [facility]  AIMS Total Score 4 6      GAD-7    Flowsheet Row Office Visit from 06/20/2022 in [facility] at [facility] Office Visit from 09/25/2021 in [facility] at [name] Office Visit from 11/16/2020 in [facility] at [facility]  Total GAD-7 Score 14 17 19       PHQ2-9    Flowsheet Row Office Visit from 06/20/2022 in [facility] at [facility] Office Visit from 09/25/2021 in [facility] Primary Care at [facility] Office Visit from 11/16/2020 in [facility] Primary Care at [facility] Office Visit from 09/03/2019 in [facility] Nutrition from 02/10/2019 in [facility] Services at [facility] Total Score 4 6 6 3 2   PHQ-9 Total Score 19 20 20 14  --      Flowsheet Row ED from 09/20/2021 in [facility] Emergency Department at [facility] Admission (Discharged) from 06/27/2020 in [facility] 4E  CV SURGICAL PROGRESSIVE CARE Pre-Admission Testing 60 from 06/23/2020 in [facility] PREADMISSION TESTING  C-SSRS RISK CATEGORY No Risk No Risk No Risk        Review of Systems:  Review of Systems  Respiratory:  Positive for shortness of breath.   Cardiovascular:   Positive for palpitations.       She reports palpitations upon standing. Denies any worsening palpitations with Methylphenidate  Gastrointestinal:  Positive for nausea.  Musculoskeletal:  Negative for gait problem.  Neurological:  Positive for headaches.  Psychiatric/Behavioral:         Please refer to HPI    Medications: I have reviewed the patient's current medications.  Current Outpatient Medications  Medication Sig Dispense Refill   aspirin-acetaminophen-caffeine (EXCEDRIN MIGRAINE) 250-250-65 MG tablet Take 2 tablets by mouth daily as needed for headache or migraine.     diphenhydrAMINE HCl, Sleep, (ZZZQUIL PO) Take by mouth at bedtime as needed.     folic acid (FOLVITE) 1 MG tablet Take 2 tablets (2 mg total) by mouth daily. 60 tablet 6   furosemide (LASIX) 20 MG tablet TAKE 1 TABLET BY MOUTH TWO TIMES A WEEK 26 tablet 5   levothyroxine (SYNTHROID) 100 MCG tablet TAKE 1 TABLET BY MOUTH DAILY WITH 137 MCG TABLET 90 tablet 1   levothyroxine (SYNTHROID) 137 MCG tablet TAKE 1 TABLET BY MOUTH DAILY BEFORE BREAKFAST WITH 100 MCG TABLET 90 tablet 1   lurasidone (LATUDA) 40 MG TABS tablet Take 1 tablet (40 mg total) by mouth daily with supper. Take 1/2 tablet daily with supper for one week, then increase to 1 tablet daily 90 tablet 1   methocarbamol (ROBAXIN) 500 MG tablet Take 1 tablet (500 mg total) by mouth every 8 (eight) hours as needed for muscle spasms. 20 tablet 0   methotrexate (RHEUMATREX) 2.5 MG tablet TAKE 6 TABLETS BY MOUTH ONCE WEEKLY PROTECT FROM LIGHT 24 tablet 5   Multiple Vitamins-Minerals (MULTIVITAMIN WITH MINERALS) tablet Take 1 tablet by mouth daily.     omeprazole (PRILOSEC) 20 MG capsule TAKE 2 CAPSULES BY MOUTH DAILY 180 capsule `   potassium chloride SA (KLOR-CON) 20 MEQ tablet Take 1 tablet (20 mEq total) by mouth 2 (two) times a week. 26 tablet 0   propranolol (INDERAL) 20 MG tablet Take 1 tablet (20 mg total) by mouth 3 (three) times daily. (Patient taking  differently: Take 20 mg by mouth 2 (two) times daily.) 270 tablet 3   Vitamin D, Ergocalciferol, (DRISDOL) 1.25 MG (50000 UNIT) CAPS capsule Take 1 capsule (50,000 Units total) by mouth 2 (two) times a week. 12 capsule 0   albuterol (VENTOLIN HFA) 108 (90 Base) MCG/ACT inhaler Inhale 2 puffs into the lungs every 6 (six) hours as needed for wheezing or shortness of breath. (Patient not taking: Reported on 09/25/2022) 8 g 2   gabapentin (NEURONTIN) 300 MG capsule Take 1 capsule (300 mg total) by mouth at bedtime. 90 capsule 1   Iron-FA-B Cmp-C-Biot-Probiotic (FUSION PLUS) CAPS One capsule twice daily with orange juice. Stop your Ferrous sulfate. (Patient not taking: Reported on 09/25/2022) 30 capsule 1   levonorgestrel (MIRENA, 52 MG,) 20 MCG/DAY IUD 1 each by Intrauterine route once.     LORazepam (ATIVAN) 0.5 MG tablet Take 1 tablet (0.5 mg total) by mouth every 8 (eight) hours as needed for anxiety. 30 tablet 1   nystatin cream (MYCOSTATIN) Apply 1 Application topically 2 (two) times daily. Apply to affected area BID for up to 7 days. (Patient  not taking: Reported on 09/25/2022) 30 g 1   ondansetron (ZOFRAN) 4 MG tablet Take 1 tablet (4 mg total) by mouth every 8 (eight) hours as needed for nausea or vomiting. (Patient not taking: Reported on 09/25/2022) 20 tablet 0   Semaglutide-Weight Management (WEGOVY) 0.25 MG/0.5ML SOAJ Inject 0.25 mg into the skin once a week. 2 mL 0   sertraline (ZOLOFT) 100 MG tablet Take 2 tablets (200 mg total) by mouth daily. 180 tablet 1   topiramate (TOPAMAX) 50 MG tablet Start with 50mg (1 tab) at bedtime. In 1 week increase to 100mg (2 tabs) at bedtime. (Patient not taking: Reported on 09/25/2022) 180 tablet 6   No current facility-administered medications for this visit.    Medication Side Effects: Other: Reports methotrexate side effects have improved some.  Allergies:  Allergies  Allergen Reactions   Augmentin [Amoxicillin-Pot Clavulanate] Hives    Past Medical  History:  Diagnosis Date   Acid reflux    ADD (attention deficit disorder)    Anemia    Anxiety    Arthritis    left ankle   Back pain    Chest pain    Complication of anesthesia    Depression    Dyspnea    with exertion   Exercise-induced asthma    as a child   Fatty liver    History of blood transfusion    History of chicken pox    History of kidney stones    passed    Hypothyroidism    Joint pain    Lower extremity edema    Major depressive disorder    Migraines    chronic   Palpitations    Pneumonia    x 2   PONV (postoperative nausea and vomiting)    nausea   RLS (restless legs syndrome)    Sleep apnea 2021   Thyroid disease    hypothyroid    Past Medical History, Surgical history, Social history, and Family history were reviewed and updated as appropriate.   Please see review of systems for further details on the patient's review from today.   Objective:   Physical Exam:  There were no vitals taken for this visit.  Physical Exam Constitutional:      General: She is not in acute distress. Musculoskeletal:        General: No deformity.  Neurological:     Mental Status: She is alert and oriented to person, place, and time.     Coordination: Coordination normal.  Psychiatric:        Attention and Perception: Attention and perception normal. She does not perceive auditory or visual hallucinations.        Mood and Affect: Affect is not labile, blunt, angry or inappropriate.        Speech: Speech normal.        Behavior: Behavior normal.        Thought Content: Thought content normal. Thought content is not paranoid or delusional. Thought content does not include homicidal or suicidal ideation. Thought content does not include homicidal or suicidal plan.        Cognition and Memory: Cognition and memory normal.        Judgment: Judgment normal.     Comments: Insight intact Mood is anxious in response to health issues. Mood is mildly depressed.      Lab Review:     Component Value Date/Time   NA 136 07/31/2022 1501   K 3.9 07/31/2022 1501   CL 101  07/31/2022 1501   CO2 25 07/31/2022 1501   GLUCOSE 101 (H) 07/31/2022 1501   BUN 9 07/31/2022 1501   CREATININE 0.71 07/31/2022 1501   CREATININE 0.74 09/30/2019 1047   CREATININE 0.75 07/06/2016 1534   CALCIUM 9.4 07/31/2022 1501   PROT 7.8 07/31/2022 1501   ALBUMIN 4.2 07/31/2022 1501   AST 54 (H) 07/31/2022 1501   AST 27 09/30/2019 1047   ALT 28 07/31/2022 1501   ALT 14 09/30/2019 1047   ALKPHOS 82 07/31/2022 1501   BILITOT 0.9 07/31/2022 1501   BILITOT 0.4 09/30/2019 1047   GFRNONAA >60 06/28/2020 0703   GFRNONAA >60 09/30/2019 1047   GFRAA >60 09/30/2019 1047       Component Value Date/Time   WBC 7.9 07/31/2022 1501   RBC 4.88 07/31/2022 1501   HGB 14.6 07/31/2022 1501   HGB 13.4 02/14/2022 1253   HGB 9.3 (L) 08/26/2019 1045   HCT 42.7 07/31/2022 1501   HCT 33.4 (L) 08/26/2019 1045   PLT 188.0 07/31/2022 1501   PLT 128 (L) 02/14/2022 1253   PLT 234 08/26/2019 1045   MCV 87.5 07/31/2022 1501   MCV 72 (L) 08/26/2019 1045   MCH 29.6 02/14/2022 1253   MCHC 34.3 07/31/2022 1501   RDW 14.0 07/31/2022 1501   RDW 15.4 08/26/2019 1045   LYMPHSABS 1.0 06/20/2022 1613   MONOABS 0.3 06/20/2022 1613   EOSABS 0.2 06/20/2022 1613   BASOSABS 0.0 06/20/2022 1613    No results found for: "POCLITH", "LITHIUM"   No results found for: "PHENYTOIN", "PHENOBARB", "VALPROATE", "CBMZ"   .res Assessment: Plan:   I spent 33 minutes dedicated to the care of this patient on the date of this encounter to include pre-visit review of records, face-to-face time with the patient discussing holding Ritalin at this time until medical work-up of palpitations is completed, ordering of medication, and post visit documentation. Pt is in agreement with not taking Ritalin at this time until medical work-up of palpitations is completed. Discussed not making any other medication changes at  this time since anxiety seems to be primarily in response to recent health issues and she reports some benefit with current medications. Recommend continuing psychotherapy.  Continue Latuda 40 mg daily for mood symptoms. Continue Sertraline 200 mg daily for anxiety and depression.  Continue Lorazepam prn anxiety.  Continue Gabapentin 300 mg in the evening for RLS and sleep disturbance.  Pt to follow-up in 3 months or sooner if clinically indicated.  Patient advised to contact office with any questions, adverse effects, or acute worsening in signs and symptoms.   Farra was seen today for anxiety and depression.  Diagnoses and all orders for this visit:  Social phobia -     sertraline (ZOLOFT) 100 MG tablet; Take 2 tablets (200 mg total) by mouth daily. -     LORazepam (ATIVAN) 0.5 MG tablet; Take 1 tablet (0.5 mg total) by mouth every 8 (eight) hours as needed for anxiety. -     gabapentin (NEURONTIN) 300 MG capsule; Take 1 capsule (300 mg total) by mouth at bedtime.  Major depression, recurrent, chronic (HCC) -     sertraline (ZOLOFT) 100 MG tablet; Take 2 tablets (200 mg total) by mouth daily.  RLS (restless legs syndrome) -     gabapentin (NEURONTIN) 300 MG capsule; Take 1 capsule (300 mg total) by mouth at bedtime.     Please see After Visit Summary for patient specific instructions.  Future Appointments  Date Time Provider Department  Center  10/01/2022  8:00 PM Coralyn Helling, MD [facility]  10/23/2022  3:10 PM Carlos Levering, NP [facility] None  12/26/2022  1:15 PM Corie Chiquito, PMHNP [facility] None  02/13/2023  1:00 PM [facility] None  02/13/2023  1:15 PM Erenest Blank, NP [facility] None    No orders of the defined types were placed in this encounter.   -------------------------------

## 2022-09-27 ENCOUNTER — Telehealth: Payer: Self-pay

## 2022-09-27 NOTE — Telephone Encounter (Addendum)
Left voice message for patient to give office a call back for monitor results. Will try calling again.   ----- Message from Margaret Levering, NP sent at 09/25/2022  7:29 AM EDT ----- Please let patient know heart monitor shows rare instances of premature beats generating from the bottom of her heart. Many triggered events were when HR was elevated and a few when HR was normal. No concerning arrhythmia. Can discuss further at follow up.   Thank you!  DW

## 2022-09-29 ENCOUNTER — Other Ambulatory Visit: Payer: Self-pay | Admitting: Psychiatry

## 2022-09-29 DIAGNOSIS — F339 Major depressive disorder, recurrent, unspecified: Secondary | ICD-10-CM

## 2022-09-29 DIAGNOSIS — F4010 Social phobia, unspecified: Secondary | ICD-10-CM

## 2022-10-01 ENCOUNTER — Encounter (HOSPITAL_BASED_OUTPATIENT_CLINIC_OR_DEPARTMENT_OTHER): Payer: BC Managed Care – PPO | Admitting: Pulmonary Disease

## 2022-10-01 DIAGNOSIS — F4323 Adjustment disorder with mixed anxiety and depressed mood: Secondary | ICD-10-CM | POA: Diagnosis not present

## 2022-10-11 DIAGNOSIS — F4323 Adjustment disorder with mixed anxiety and depressed mood: Secondary | ICD-10-CM | POA: Diagnosis not present

## 2022-10-21 NOTE — Progress Notes (Unsigned)
Cardiology Clinic Note   Date: 10/23/2022 ID: [name], DOB 27-Apr-1990, MRN [id number]  Primary Cardiologist:  Thomasene Ripple, DO  Patient Profile    Margaret Golden is a 32 y.o. female who presents to the clinic today for follow up after testing.     Past medical history significant for: Palpitations/NSVT.  14-day ZIO 09/10/2022: Min HR 53 bpm, max HR 165 bpm, average HR 88 bpm.  Rare PVCs. Chest tightness/shortness of breath: Coronary CTA 11/23/2019: Coronary calcium score 0. Echo 09/19/2022: EF 60 to 65%.  Normal diastolic parameters.  Normal RV function.  No significant valvular abnormalities.  Unchanged from prior study June 2021. Hypertension.  Migraines.  OSA.  GERD.  Hypothyroidism.  Anemia. Anxiety disorder. Pulmonary sarcoidosis. Confirmed with biopsy. On methotrexate.     History of Present Illness    Margaret Golden was first evaluated by Dr. Servando Salina on 08/26/2019 with complaints of palpitations, shortness of breath and chest tightness.  She reported hospital admission January 2021 in which she was found to be anemic and received a blood transfusion.  She presented to the ED again at the end of March for shortness of breath and chest tightness.  She had an unremarkable workup at that time.  She continues to be followed for the above outlined history.   Patient was last seen in the office by me on 08/16/2022 for complaints of chest tightness with light exertion and associated with palpitations described as racing.  Discomfort lasts minutes and resolves with sitting down or lying down to rest.  Chest tightness and palpitations are described as always occurring together but varies which symptom comes first.  She also reported DOE.  At the time of her visit she had been off methotrexate for her pulmonary sarcoidosis for approximately 5 weeks and had just recently started retitration of the medication.  She admitted chest tightness and palpitations for more than send since  being off the medication.  14-day ZIO showed rare PVCs.  Echo showed normal LV/RV function and no significant valvular abnormalities.  Today, patient reports symptoms are about the same. She has not started wearing compression socks stating "I knew I was forgetting something." She reports her Apple Watch will alert that her heart rate is elevated when she is just sitting doing nothing. She also reports increased heart rate to the 150s with walking in the grocery store. She does not stay well hydrating drinking 16-32 oz most days. She is taking propranolol twice a day instead of three times a day because she often cannot fit in the 3rd dose based on when she gets up in the morning. She reports continued fatigue that she relates to methotrexate for pulmonary sarcoidosis. Discussed with patient the element of body habitus and deconditioning contributing to some of her symptoms.        ROS: All other systems reviewed and are otherwise negative except as noted in History of Present Illness.  Studies Reviewed     EKG not ordered today.      Physical Exam    VS:  BP (!) 142/88   Pulse 95   Ht 5\' 7"  (1.702 m)   Wt (!) 385 lb (174.6 kg)   SpO2 99%   BMI 60.30 kg/m  , BMI Body mass index is 60.3 kg/m.  GEN: Well nourished, well developed, in no acute distress. Neck: No JVD or carotid bruits. Cardiac:  RRR. No murmurs. No rubs or gallops.   Respiratory:  Respirations regular and unlabored.  Clear to auscultation without rales, wheezing or rhonchi. GI: Soft, nontender, nondistended. Extremities: Radials/DP/PT 2+ and equal bilaterally. No clubbing or cyanosis. Difficult to assess edema secondary to body habitus.   Skin: Warm and dry, no rash. Neuro: Strength intact.  Assessment & Plan    Palpitations/chest tightness/DOE.  14-day ZIO June 2024 showed rare PVCs.  Patient reports symptoms are unchanged. She has not started wearing compression socks. She admits to poor hydration drinking only  16-32 oz most days. She has been taking propranolol twice a day instead of three times a day because she often cannot get the third dose in based on the time she wakes up in the morning. Discussed getting compression socks and the importance of increasing hydration. Suggested patient set an alarm to take propranolol earlier so she can fit in the third dose. She agrees to do this.  Hypertension. BP today 142/80.   Continue propranolol.  Disposition: Begin wearing compression socks. Increase hydration. Take propranolol 3 times a day. Return in 5-6 months or sooner as needed.          Signed, Etta Grandchild. Zacarias Krauter, DNP, NP-C

## 2022-10-22 DIAGNOSIS — F4323 Adjustment disorder with mixed anxiety and depressed mood: Secondary | ICD-10-CM | POA: Diagnosis not present

## 2022-10-23 ENCOUNTER — Encounter: Payer: Self-pay | Admitting: Student

## 2022-10-23 ENCOUNTER — Ambulatory Visit: Payer: BC Managed Care – PPO | Attending: Student | Admitting: Student

## 2022-10-23 VITALS — BP 142/88 | HR 95 | Ht 67.0 in | Wt 385.0 lb

## 2022-10-23 DIAGNOSIS — R0609 Other forms of dyspnea: Secondary | ICD-10-CM | POA: Diagnosis not present

## 2022-10-23 DIAGNOSIS — R002 Palpitations: Secondary | ICD-10-CM

## 2022-10-23 DIAGNOSIS — R0789 Other chest pain: Secondary | ICD-10-CM | POA: Diagnosis not present

## 2022-10-23 DIAGNOSIS — I10 Essential (primary) hypertension: Secondary | ICD-10-CM

## 2022-10-23 NOTE — Patient Instructions (Addendum)
Medication Instructions:  Propranolol ( Take 1 Tablet Three (3) Time Daily). *If you need a refill on your cardiac medications before your next appointment, please call your pharmacy*   Lab Work: No labs If you have labs (blood work) drawn today and your tests are completely normal, you will receive your results only by: MyChart Message (if you have MyChart) OR A paper copy in the mail If you have any lab test that is abnormal or we need to change your treatment, we will call you to review the results.   Testing/Procedures: No Testing   Follow-Up: At [facility], you and your health needs are our priority.  As part of our continuing mission to provide you with exceptional heart care, we have created designated Provider Care Teams.  These Care Teams include your primary Cardiologist (physician) and Advanced Practice Providers (APPs -  Physician Assistants and Nurse Practitioners) who all work together to provide you with the care you need, when you need it.  We recommend signing up for the patient portal called "MyChart".  Sign up information is provided on this After Visit Summary.  MyChart is used to connect with patients for Virtual Visits (Telemedicine).  Patients are able to view lab/test results, encounter notes, upcoming appointments, etc.  Non-urgent messages can be sent to your provider as well.   To learn more about what you can do with MyChart, go to [email or web address].    Your next appointment:   5-6 month(s)  Provider:   Thomasene Ripple, DO     Recommended Increase Hydration. Recommended to Wear Compression Stockings

## 2022-11-05 DIAGNOSIS — F4323 Adjustment disorder with mixed anxiety and depressed mood: Secondary | ICD-10-CM | POA: Diagnosis not present

## 2022-11-12 DIAGNOSIS — F4323 Adjustment disorder with mixed anxiety and depressed mood: Secondary | ICD-10-CM | POA: Diagnosis not present

## 2022-11-19 DIAGNOSIS — F4323 Adjustment disorder with mixed anxiety and depressed mood: Secondary | ICD-10-CM | POA: Diagnosis not present

## 2022-11-25 IMAGING — CT CT CHEST W/ CM
2 of 3 series · 15 of 36 positions shown, 18 images · IV contrast (APPLIED)
Comparison: 12/17/2019, 06/29/2019

CLINICAL DATA: Lymphadenopathy, chronic dyspnea, pulmonary nodule

EXAM:
CT CHEST WITH CONTRAST
TECHNIQUE: Multidetector CT imaging of the chest was performed during
intravenous contrast administration.
CONTRAST:  75mL OMNIPAQUE IOHEXOL 300 MG/ML  SOLN

[Series 3: thorax 2.0 i31f 2 · axial · 0.98mm/px · z∈[+1028,+1294]mm · 12 of 157 slices shown, 15 images]
[im 12/157  mediastinal]
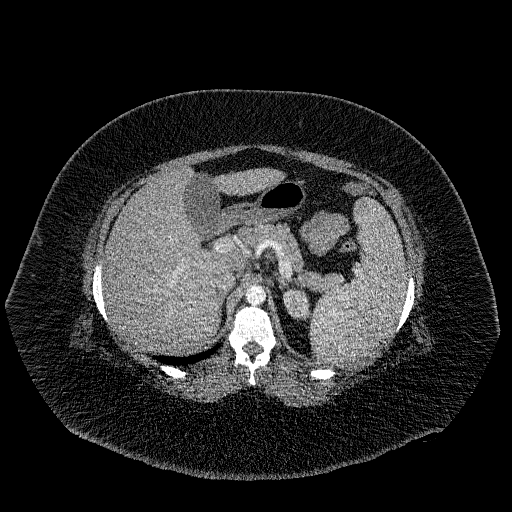
[im 12/157  lung]
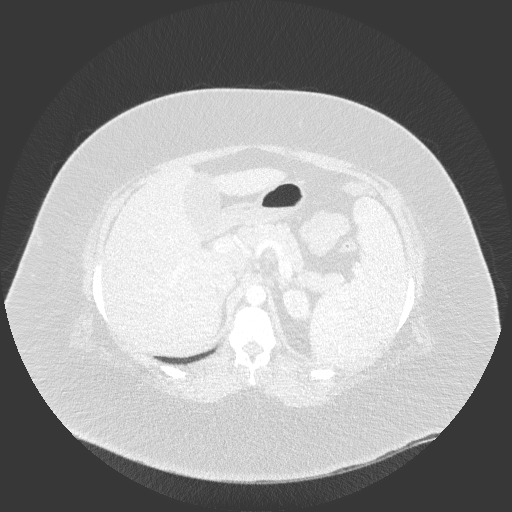
[im 24/157  lung]
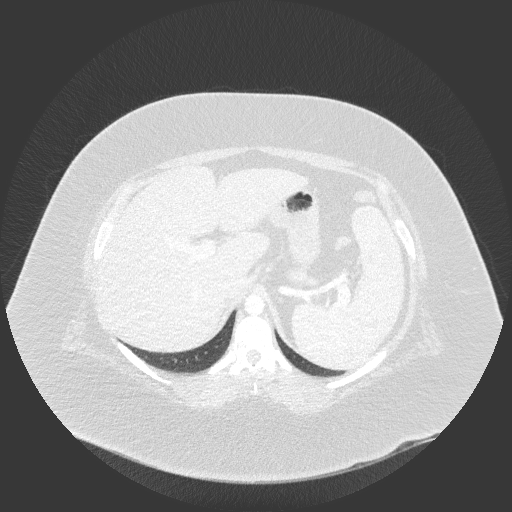
[im 35/157  lung]
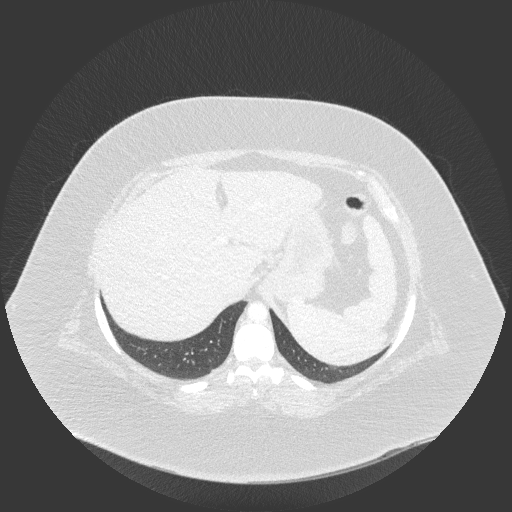
[im 47/157  lung]
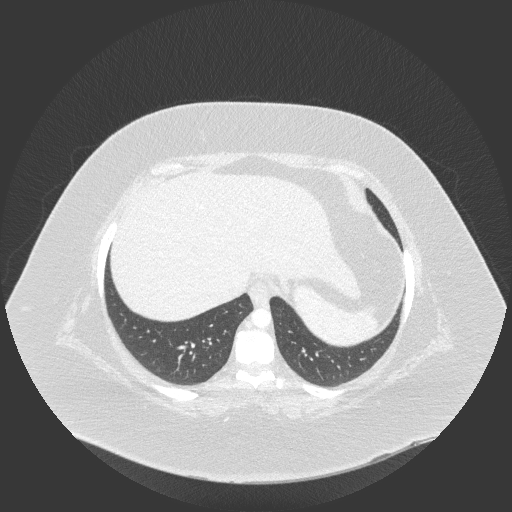
[im 58/157  mediastinal]
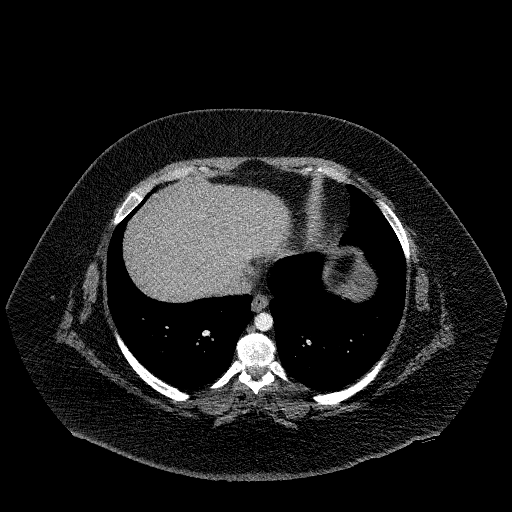
[im 58/157  lung]
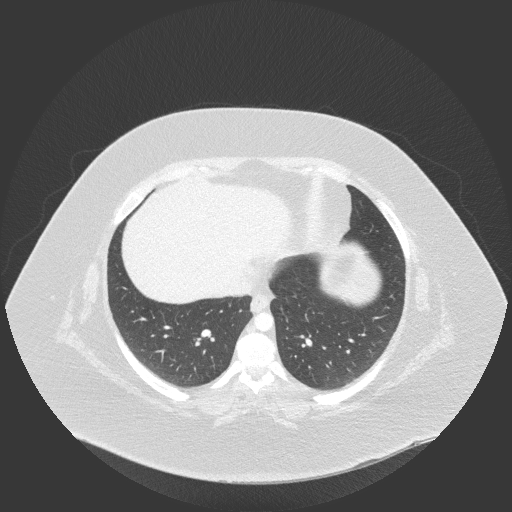
[im 70/157  lung]
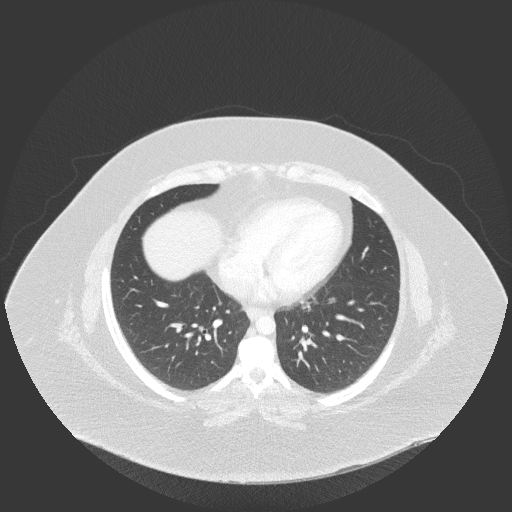
[im 87/157  lung]
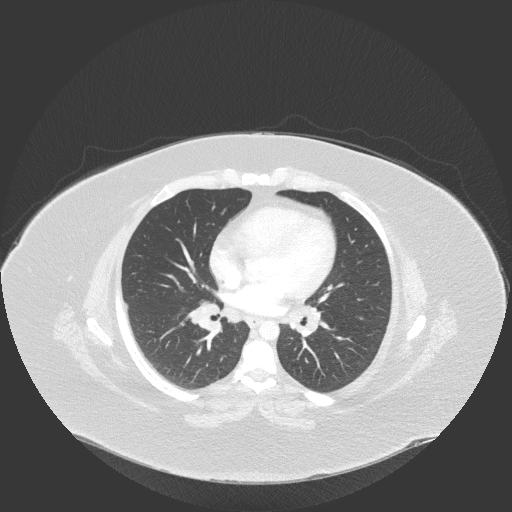
[im 99/157  lung]
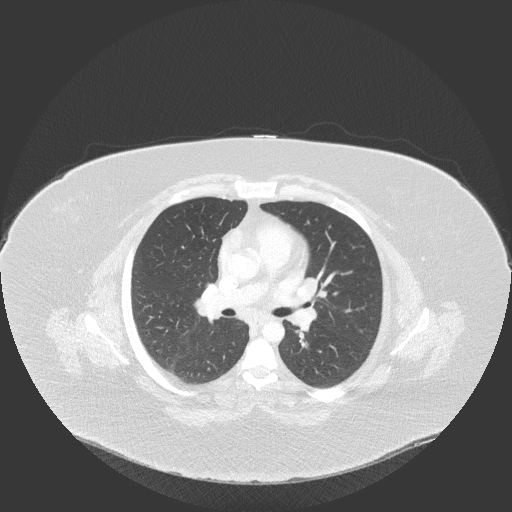
[im 110/157  mediastinal]
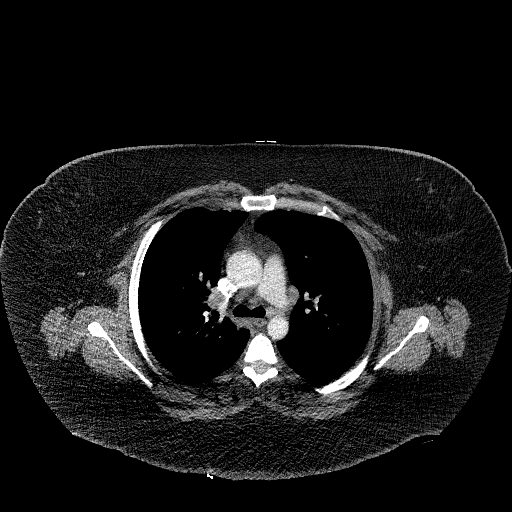
[im 110/157  lung]
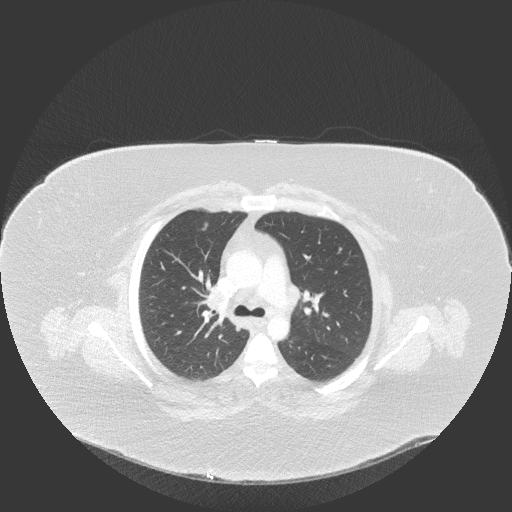
[im 122/157  lung]
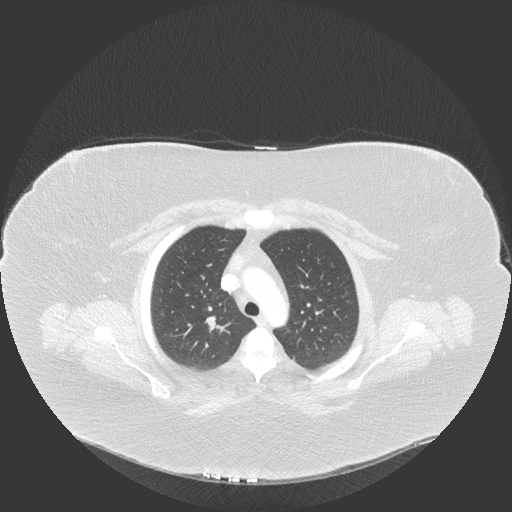
[im 133/157  lung]
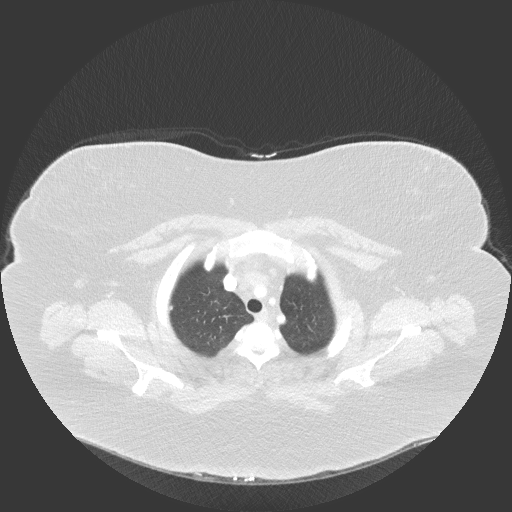
[im 145/157  lung]
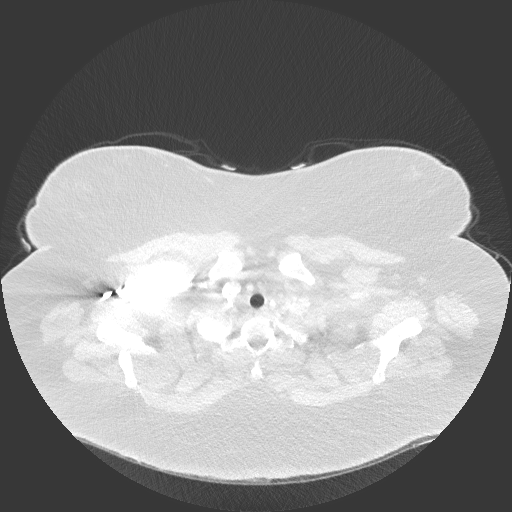

[Series 6: coronal · coronal · 0.61mm/px · 3 of 168 slices shown]
[im 34/168  lung]
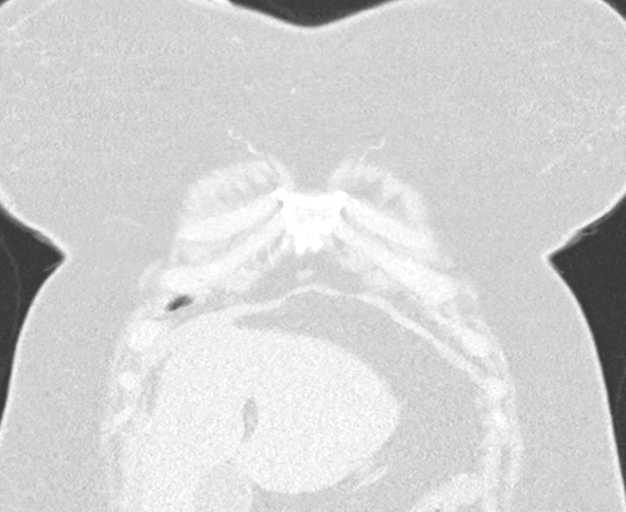
[im 67/168  lung]
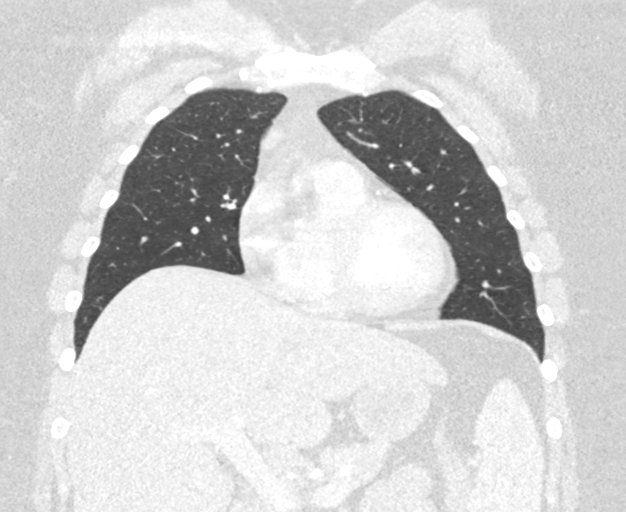
[im 101/168  lung]
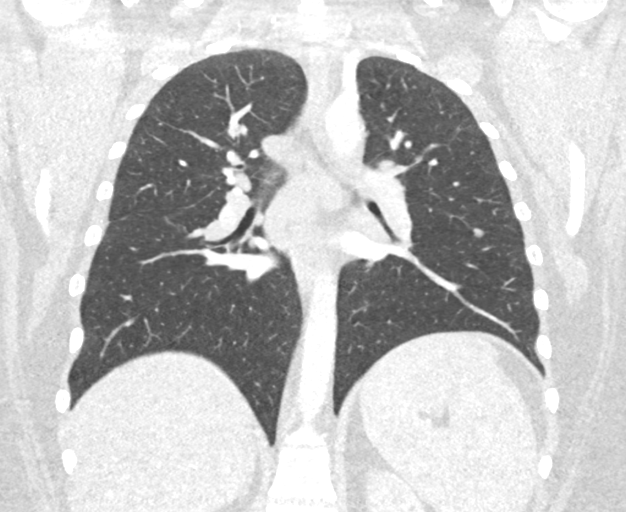

[15 of 36 positions shown; findings below may reference images not displayed]

FINDINGS: Cardiovascular: No significant coronary artery calcification. Global
cardiac size within normal limits. No pericardial effusion. Central
pulmonary arteries are of normal caliber. The thoracic aorta is
unremarkable.

Mediastinum/Nodes: Mediastinal, bilateral hilar, paraesophageal and
pericardial adenopathy is again identified. Since the prior
examination, there has been slight, but uniform interval enlargement
of these lymph nodes. By example, right pre-vascular lymph node,
axial image # 38, measures 12 mm (previously measuring 11 mm). Right
hilar lymph node, image # 64, measures 12 mm (previously measuring
10 mm). Pericardial lymph node, axial image # 86, measures 12 mm
(previously measuring 10 mm). Thyroid unremarkable. Esophagus
unremarkable.

Lungs/Pleura: There are multiple pulmonary nodules are again
identified bilaterally which are unchanged in size and number. The
dominant nodules are seen within the right upper lobe, axial image #
37 (8 mm) and axial image # 40 (7 mm), right lower lobe axial image
# 83 (6 mm), and left lower lobe image # 63 (8 mm). No superimposed
confluent pulmonary infiltrate. No pneumothorax or pleural effusion.
The central airways are widely patent.

Upper Abdomen: There is moderate splenomegaly again identified with
the spleen measuring up to 16.5 cm in greatest dimension. No acute
abnormality.

Musculoskeletal: No acute bone abnormality.
IMPRESSION: Mild interval progression of thoracic adenopathy. This can be
reactive, as can be seen in atypical infection such as
mononucleosis, inflammatory, as can be seen with sarcoidosis, or
lymphoproliferative in nature. PET CT examination may be helpful for
further evaluation as well as potential identification of a a
optimal biopsy target, if desired.

Numerous pulmonary nodules, stable in both size and number.
Appropriate follow-up guidelines have not been established in a
patient of this age, however, in a patient without significant risk
factors for malignancy, a follow-up examination in 1 year may be
helpful to document stability and confirm an underlying benign
etiology.

Stable moderate splenomegaly

## 2022-11-26 ENCOUNTER — Telehealth: Payer: Self-pay | Admitting: Family

## 2022-11-26 ENCOUNTER — Ambulatory Visit: Payer: BC Managed Care – PPO | Admitting: Family

## 2022-11-26 VITALS — BP 109/62 | HR 85 | Temp 98.7°F | Wt 383.0 lb

## 2022-11-26 DIAGNOSIS — F329 Major depressive disorder, single episode, unspecified: Secondary | ICD-10-CM | POA: Diagnosis not present

## 2022-11-26 DIAGNOSIS — G5601 Carpal tunnel syndrome, right upper limb: Secondary | ICD-10-CM | POA: Diagnosis not present

## 2022-11-26 DIAGNOSIS — Z6841 Body Mass Index (BMI) 40.0 and over, adult: Secondary | ICD-10-CM

## 2022-11-26 DIAGNOSIS — E039 Hypothyroidism, unspecified: Secondary | ICD-10-CM | POA: Diagnosis not present

## 2022-11-26 DIAGNOSIS — E88819 Insulin resistance, unspecified: Secondary | ICD-10-CM | POA: Diagnosis not present

## 2022-11-26 DIAGNOSIS — F4323 Adjustment disorder with mixed anxiety and depressed mood: Secondary | ICD-10-CM | POA: Diagnosis not present

## 2022-11-26 DIAGNOSIS — D869 Sarcoidosis, unspecified: Secondary | ICD-10-CM

## 2022-11-26 LAB — HEMOGLOBIN A1C: Hgb A1c MFr Bld: 4.3 % — ABNORMAL LOW (ref 4.6–6.5)

## 2022-11-26 LAB — TSH: TSH: 0.25 u[IU]/mL — ABNORMAL LOW (ref 0.35–5.50)

## 2022-11-26 MED ORDER — ZEPBOUND 2.5 MG/0.5ML ~~LOC~~ SOAJ: 2.5000 mg | SUBCUTANEOUS | 2 refills | Status: DC

## 2022-11-26 MED ORDER — MELOXICAM 7.5 MG PO TABS: 7.5000 mg | ORAL_TABLET | Freq: Every day | ORAL | 0 refills | Status: DC

## 2022-11-26 MED ORDER — LEVOTHYROXINE SODIUM 125 MCG PO TABS
125.0000 ug | ORAL_TABLET | Freq: Every day | ORAL | 0 refills | Status: DC
Start: 2022-11-26 — End: 2023-01-09

## 2022-11-26 NOTE — Patient Instructions (Signed)
VISIT SUMMARY:  During your visit, we discussed your ongoing conditions of sarcoidosis and hypothyroidism, as well as your recent symptoms of tingling in your fingers and discomfort in your wrist. We also talked about your efforts to manage your weight and your mental health.  YOUR PLAN:  -SARCOIDOSIS: Sarcoidosis is a disease that leads to inflammation, usually in your lungs, skin, or lymph nodes. Please continue your current treatment with Methotrexate- per your pulmonologist.   -HYPOTHYROIDISM: Hypothyroidism is a condition where your thyroid gland doesn't produce enough thyroid hormones. We will check your thyroid function tests today to ensure your current medication, Synthroid, is working well.  -DEPRESSION: Depression is a mood disorder that causes a persistent feeling of sadness and loss of interest. You will continue your current medications, Latuda and Sertraline, as they seem to be helping improve your mood.  -CARPAL TUNNEL SYNDROME: Carpal Tunnel Syndrome is a condition that causes numbness, tingling, or weakness in your hand. We will start you on Meloxicam for two weeks and recommend purchasing a wrist splint to wear at night and during rest periods. If there's no improvement, we may consider referring you to sports medicine.  -WEIGHT MANAGEMENT: Managing your weight involves a combination of diet, exercise, and sometimes medication. We will attempt to obtain insurance coverage for Zepbound, a weight loss medication, and encourage you to continue with your lifestyle modifications.  INSTRUCTIONS:  Please continue taking your medications as prescribed. Start taking Meloxicam for your Carpal Tunnel Syndrome and consider purchasing a wrist splint. We will also be checking your thyroid function and diabetes screening tests today. Please schedule your annual physical exam as well.

## 2022-11-26 NOTE — Telephone Encounter (Signed)
Lab work shows thyroid medication should be reduced.  Please change the synthroid 137 cmg to and continue the once daily. We can repeat TSH in 6 weeks.

## 2022-11-26 NOTE — Assessment & Plan Note (Signed)
  Stable on Synthroid. -Check thyroid function tests today.

## 2022-11-26 NOTE — Assessment & Plan Note (Signed)
Unchanged. Insurance would not cover Wegovy last year, will see if we can get Zepbound approved. Continue with lifestyle modification.

## 2022-11-26 NOTE — Assessment & Plan Note (Signed)
Clinically stable on methotrexate- management per pulmonology.

## 2022-11-26 NOTE — Progress Notes (Signed)
Subjective:     Patient ID: Margaret Golden, female    DOB: [birth year], 32 y.o.   MRN: [id number]  Chief Complaint  Patient presents with   Hypothyroidism    Here for follow up   Numbness    Complains of tingling on right hand    HPI  Discussed the use of AI scribe software for clinical note transcription with the patient, who gave verbal consent to proceed.  The patient, with a history of morbid obesity, sarcoidosis and hypothyroidism, presents with tingling in her right 1st, 2nd and 3rd fingers fingers, especially when holding a pen, utensils, or using a computer mouse. She also reports a sensation of tenseness and tightness in her right hand, particularly in the first three fingers, sometimes extending to the fourth. She also experiences neck pain and stiffness on the right side.  She denies pain shooting down the right arm.  Regarding her sarcoidosis, she is currently on methotrexate, which she takes weekly, splitting the dose over two days. This regimen leaves her feeling exhausted for about two days after administration, but she reports that the fatigue is not as severe as before. She continues to experience shortness of breath, a symptom she attributes to her sarcoidosis, and rarely coughs. She reports feeling breathless and tight in the chest when moving around too much or doing errands, especially towards the end of the day. She is followed by pulmonology for her sarcoidosis.  Her hypothyroidism is managed with Synthroid, which she reports feeling okay on. She is also on Latuda and sertraline for mental health management, and reports that her mood has improved.  The patient has been making lifestyle changes to manage her weight, including walking more, drinking more water, and opting for leaner meats. However, she has noticed some weight gain and is considering additional interventions. Wt Readings from Last 3 Encounters:  11/26/22 (!) 383 lb (173.7 kg)  10/23/22 (!) 385 lb  (174.6 kg)  08/31/22 (!) 382 lb (173.3 kg)   History of Present Illness   The patient, with a history of morbid obesity, sarcoidosis and hypothyroidism, presents with tingling in her right 1st, 2nd and 3rd fingers fingers, especially when holding a pen, utensils, or using a computer mouse. She also reports a sensation of tenseness and tightness in her right hand, particularly in the first three fingers, sometimes extending to the fourth. She also experiences neck pain and stiffness on the right side.  She denies pain shooting down the right arm.  Regarding her sarcoidosis, she is currently on methotrexate, which she takes weekly, splitting the dose over two days. This regimen leaves her feeling exhausted for about two days after administration, but she reports that the fatigue is not as severe as before. She continues to experience shortness of breath, a symptom she attributes to her sarcoidosis, and rarely coughs. She reports feeling breathless and tight in the chest when moving around too much or doing errands, especially towards the end of the day. She is followed by pulmonology for her sarcoidosis.  Her hypothyroidism is managed with Synthroid, which she reports feeling okay on. She is also on Latuda and sertraline for mental health management, and reports that her mood has improved.  The patient has been making lifestyle changes to manage her weight, including walking more, drinking more water, and opting for leaner meats. However, she has noticed some weight gain and is considering additional interventions.       Lab Results  Component Value Date  TSH 1.96 05/24/2021      Health Maintenance Due  Topic Date Due   Diabetic kidney evaluation - Urine ACR  Never done   COVID-19 Vaccine (3 - Pfizer risk series) 08/17/2019   PAP SMEAR-Modifier  05/18/2022   INFLUENZA VACCINE  11/01/2022    Past Medical History:  Diagnosis Date   Acid reflux    ADD (attention deficit disorder)     Anemia    Anxiety    Arthritis    left ankle   Back pain    Chest pain    Complication of anesthesia    Depression    Dyspnea    with exertion   Exercise-induced asthma    as a child   Fatty liver    History of blood transfusion    History of chicken pox    History of kidney stones    passed    Hypothyroidism    Joint pain    Lower extremity edema    Major depressive disorder    Migraines    chronic   Palpitations    Pneumonia    x 2   PONV (postoperative nausea and vomiting)    nausea   RLS (restless legs syndrome)    Sleep apnea 2021   Thyroid disease    hypothyroid    Past Surgical History:  Procedure Laterality Date   BRONCHIAL NEEDLE ASPIRATION BIOPSY  03/31/2020   Procedure: BRONCHIAL NEEDLE ASPIRATION BIOPSIES;  Surgeon: Josephine Igo, DO;  Location: [facility];  Service: Pulmonary;;   BRONCHIAL WASHINGS  03/31/2020   Procedure: BRONCHIAL WASHINGS;  Surgeon: Josephine Igo, DO;  Location: [facility];  Service: Pulmonary;;   DILATATION & CURETTAGE/HYSTEROSCOPY WITH MYOSURE N/A 03/22/2020   Procedure: DILATATION & CURETTAGE/HYSTEROSCOPY WITH MYOSURE;  Surgeon: Romualdo Bolk, MD;  Location: [facility] OR;  Service: Gynecology;  Laterality: N/A;   INTERCOSTAL NERVE BLOCK Right 06/27/2020   Procedure: INTERCOSTAL NERVE BLOCK;  Surgeon: Corliss Skains, MD;  Location: [facility] OR;  Service: Thoracic;  Laterality: Right;   INTRAUTERINE DEVICE (IUD) INSERTION N/A 03/22/2020   Procedure: INTRAUTERINE DEVICE (IUD) INSERTION;  Surgeon: Romualdo Bolk, MD;  Location: [facility] OR;  Service: Gynecology;  Laterality: N/A;  Mirena IUD insertion   KNEE ARTHROSCOPY Right 2007   LYMPH NODE BIOPSY Right 06/27/2020   Procedure: LYMPH NODE BIOPSY;  Surgeon: Corliss Skains, MD;  Location: [facility] OR;  Service: Thoracic;  Laterality: Right;   TONSILLECTOMY  2010   TONSILLECTOMY     VIDEO BRONCHOSCOPY WITH ENDOBRONCHIAL ULTRASOUND N/A 03/31/2020   Procedure: VIDEO BRONCHOSCOPY  WITH ENDOBRONCHIAL ULTRASOUND;  Surgeon: Josephine Igo, DO;  Location: [facility];  Service: Pulmonary;  Laterality: N/A;   WISDOM TOOTH EXTRACTION     WISDOM TOOTH EXTRACTION Bilateral    upper and lower    Family History  Problem Relation Age of Onset   Depression Mother    Obesity Mother        died from "an infection" ? MRSA   Depression Father    Sleep apnea Father    Obesity Father    Arthritis Maternal Grandmother    Breast cancer Maternal Grandmother        in remission   Diabetes Maternal Grandfather    Arthritis Maternal Grandfather    Arthritis Paternal Grandmother    Arthritis Paternal Grandfather    Cancer Paternal Aunt        colon   Cancer Paternal Uncle  brain cancer   Migraines Neg Hx     Social History   Socioeconomic History   Marital status: Single    Spouse name: Not on file   Number of children: 0   Years of education: 12   Highest education level: Not on file  Occupational History   Occupation: Conservation officer, historic buildings  Tobacco Use   Smoking status: Never    Passive exposure: Never   Smokeless tobacco: Never  Vaping Use   Vaping status: Never Used  Substance and Sexual Activity   Alcohol use: Not Currently    Comment: rare social drink maybe once "every 5 blue moons"   Drug use: No   Sexual activity: Never    Comment: never sexually active  Other Topics Concern   Not on file  Social History Narrative   Unemployed   Lives alone --update 11/11/19   Seeking work   Only child    Lives in a one story house with father, has a cat-12/26/16-sjb      Caffeine: 2-4 cups/day   Social Determinants of Corporate investment banker Strain: Not on file  Food Insecurity: Not on file  Transportation Needs: Not on file  Physical Activity: Not on file  Stress: Not on file  Social Connections: Not on file  Intimate Partner Violence: Not on file    Outpatient Medications Prior to Visit  Medication Sig Dispense Refill   albuterol  (VENTOLIN HFA) 108 (90 Base) MCG/ACT inhaler Inhale 2 puffs into the lungs every 6 (six) hours as needed for wheezing or shortness of breath. 8 g 2   aspirin-acetaminophen-caffeine (EXCEDRIN MIGRAINE) 250-250-65 MG tablet Take 2 tablets by mouth daily as needed for headache or migraine.     diphenhydrAMINE HCl, Sleep, (ZZZQUIL PO) Take by mouth at bedtime as needed.     folic acid (FOLVITE) 1 MG tablet Take 2 tablets (2 mg total) by mouth daily. 60 tablet 6   furosemide (LASIX) 20 MG tablet TAKE 1 TABLET BY MOUTH TWO TIMES A WEEK 26 tablet 5   gabapentin (NEURONTIN) 300 MG capsule Take 1 capsule (300 mg total) by mouth at bedtime. 90 capsule 1   Iron-FA-B Cmp-C-Biot-Probiotic (FUSION PLUS) CAPS One capsule twice daily with orange juice. Stop your Ferrous sulfate. 30 capsule 1   levonorgestrel (MIRENA, 52 MG,) 20 MCG/DAY IUD 1 each by Intrauterine route once.     levothyroxine (SYNTHROID) 100 MCG tablet TAKE 1 TABLET BY MOUTH DAILY WITH 137 MCG TABLET 90 tablet 1   levothyroxine (SYNTHROID) 137 MCG tablet TAKE 1 TABLET BY MOUTH DAILY BEFORE BREAKFAST WITH 100 MCG TABLET 90 tablet 1   LORazepam (ATIVAN) 0.5 MG tablet Take 1 tablet (0.5 mg total) by mouth every 8 (eight) hours as needed for anxiety. 30 tablet 1   lurasidone (LATUDA) 40 MG TABS tablet Take 1 tablet (40 mg total) by mouth daily with supper. Take 1/2 tablet daily with supper for one week, then increase to 1 tablet daily 90 tablet 1   methocarbamol (ROBAXIN) 500 MG tablet Take 1 tablet (500 mg total) by mouth every 8 (eight) hours as needed for muscle spasms. 20 tablet 0   methotrexate (RHEUMATREX) 2.5 MG tablet TAKE 6 TABLETS BY MOUTH ONCE WEEKLY PROTECT FROM LIGHT 24 tablet 5   Multiple Vitamins-Minerals (MULTIVITAMIN WITH MINERALS) tablet Take 1 tablet by mouth daily.     nystatin cream (MYCOSTATIN) Apply 1 Application topically 2 (two) times daily. Apply to affected area BID for up to 7 days.  30 g 1   omeprazole (PRILOSEC) 20 MG capsule  TAKE 2 CAPSULES BY MOUTH DAILY 180 capsule `   ondansetron (ZOFRAN) 4 MG tablet Take 1 tablet (4 mg total) by mouth every 8 (eight) hours as needed for nausea or vomiting. 20 tablet 0   potassium chloride SA (KLOR-CON) 20 MEQ tablet Take 1 tablet (20 mEq total) by mouth 2 (two) times a week. 26 tablet 0   propranolol (INDERAL) 20 MG tablet Take 1 tablet (20 mg total) by mouth 3 (three) times daily. (Patient taking differently: Take 20 mg by mouth 2 (two) times daily.) 270 tablet 3   sertraline (ZOLOFT) 100 MG tablet Take 2 tablets (200 mg total) by mouth daily. 180 tablet 1   topiramate (TOPAMAX) 50 MG tablet Start with 50mg (1 tab) at bedtime. In 1 week increase to 100mg (2 tabs) at bedtime. 180 tablet 6   Vitamin D, Ergocalciferol, (DRISDOL) 1.25 MG (50000 UNIT) CAPS capsule Take 1 capsule (50,000 Units total) by mouth 2 (two) times a week. 12 capsule 0   Semaglutide-Weight Management (WEGOVY) 0.25 MG/0.5ML SOAJ Inject 0.25 mg into the skin once a week. 2 mL 0   No facility-administered medications prior to visit.    Allergies  Allergen Reactions   Augmentin [Amoxicillin-Pot Clavulanate] Hives    ROS See HPI    Objective:    Physical Exam Constitutional:      General: She is not in acute distress.    Appearance: Normal appearance. She is well-developed.  HENT:     Head: Normocephalic and atraumatic.     Right Ear: External ear normal.     Left Ear: External ear normal.  Eyes:     General: No scleral icterus. Neck:     Thyroid: No thyromegaly.  Cardiovascular:     Rate and Rhythm: Normal rate and regular rhythm.     Heart sounds: Normal heart sounds. No murmur heard. Pulmonary:     Effort: Pulmonary effort is normal. No respiratory distress.     Breath sounds: Normal breath sounds. No wheezing.  Musculoskeletal:     Cervical back: Neck supple.  Skin:    General: Skin is warm and dry.  Neurological:     Mental Status: She is alert and oriented to person, place, and time.      Comments: + tinnels right , negative on left Bilateral hand grasps 5/5  Psychiatric:        Mood and Affect: Mood normal.        Behavior: Behavior normal.        Thought Content: Thought content normal.        Judgment: Judgment normal.      BP 109/62 (BP Location: Right Arm, Patient Position: Sitting, Cuff Size: Large)   Pulse 85   Temp 98.7 F (37.1 C) (Oral)   Wt (!) 383 lb (173.7 kg)   SpO2 100%   BMI 59.99 kg/m  Wt Readings from Last 3 Encounters:  11/26/22 (!) 383 lb (173.7 kg)  10/23/22 (!) 385 lb (174.6 kg)  08/31/22 (!) 382 lb (173.3 kg)       Assessment & Plan:   Problem List Items Addressed This Visit       Unprioritized   Sarcoidosis    Clinically stable on methotrexate- management per pulmonology.       Major depressive disorder    Stable on Latuda and Sertraline. -Continue current medications per psychiatry.      Insulin resistance   Relevant Orders  HgB A1c   Hypothyroidism - Primary     Stable on Synthroid. -Check thyroid function tests today.       Relevant Orders   TSH   Carpal tunnel syndrome of right wrist    New.   New onset of tingling in fingers and wrist discomfort, especially with use. -Start Meloxicam for two weeks. -Recommend purchasing a wrist splint to wear at night and during rest periods. -If no improvement, consider referral to sports medicine. - I don't think her symptoms are secondary to cervical radiculopathy, but we discussed that the meloxicam can also be helpful with her neck pain.       Relevant Medications   tirzepatide (ZEPBOUND) 2.5 MG/0.5ML Pen   BMI 50.0-59.9, adult (HCC)    Unchanged. Insurance would not cover Wegovy last year, will see if we can get Zepbound approved. Continue with lifestyle modification.       Relevant Medications   tirzepatide (ZEPBOUND) 2.5 MG/0.5ML Pen   Other Visit Diagnoses     Morbid obesity with BMI of 50.0-59.9, adult (HCC)       Relevant Medications   tirzepatide  (ZEPBOUND) 2.5 MG/0.5ML Pen       I have discontinued Margaret Golden [employer]. I am also having her start on meloxicam and Zepbound. Additionally, I am having her maintain her multivitamin with minerals, Fusion Plus, topiramate, potassium chloride SA, propranolol, aspirin-acetaminophen-caffeine, furosemide, methocarbamol, Mirena (52 MG), (diphenhydrAMINE HCl, Sleep, (ZZZQUIL PO)), Vitamin D (Ergocalciferol), albuterol, nystatin cream, omeprazole, ondansetron, levothyroxine, levothyroxine, lurasidone, methotrexate, folic acid, sertraline, LORazepam, and gabapentin.  Meds ordered this encounter  Medications   meloxicam (MOBIC) 7.5 MG tablet    Sig: Take 1 tablet (7.5 mg total) by mouth daily.    Dispense:  14 tablet    Refill:  0    Order Specific Question:   Supervising Provider    Answer:   Danise Edge A [[id number]]   tirzepatide (ZEPBOUND) 2.5 MG/0.5ML Pen    Sig: Inject 2.5 mg into the skin once a week.    Dispense:  2 mL    Refill:  2    Order Specific Question:   Supervising Provider    Answer:   Danise Edge A [id number]

## 2022-11-26 NOTE — Assessment & Plan Note (Signed)
New.   New onset of tingling in fingers and wrist discomfort, especially with use. -Start Meloxicam for two weeks. -Recommend purchasing a wrist splint to wear at night and during rest periods. -If no improvement, consider referral to sports medicine. - I don't think her symptoms are secondary to cervical radiculopathy, but we discussed that the meloxicam can also be helpful with her neck pain.

## 2022-11-26 NOTE — Assessment & Plan Note (Signed)
Stable on Latuda and Sertraline. -Continue current medications per psychiatry.

## 2022-11-27 ENCOUNTER — Telehealth: Payer: Self-pay | Admitting: Family

## 2022-11-27 ENCOUNTER — Encounter: Payer: Self-pay | Admitting: Family

## 2022-11-27 ENCOUNTER — Ambulatory Visit (INDEPENDENT_AMBULATORY_CARE_PROVIDER_SITE_OTHER): Payer: BC Managed Care – PPO | Admitting: Family

## 2022-11-27 VITALS — BP 124/67 | HR 74 | Temp 98.9°F | Resp 16 | Ht 67.0 in | Wt 382.0 lb

## 2022-11-27 DIAGNOSIS — Z0000 Encounter for general adult medical examination without abnormal findings: Secondary | ICD-10-CM

## 2022-11-27 DIAGNOSIS — F329 Major depressive disorder, single episode, unspecified: Secondary | ICD-10-CM | POA: Diagnosis not present

## 2022-11-27 DIAGNOSIS — E039 Hypothyroidism, unspecified: Secondary | ICD-10-CM

## 2022-11-27 DIAGNOSIS — G43809 Other migraine, not intractable, without status migrainosus: Secondary | ICD-10-CM

## 2022-11-27 DIAGNOSIS — R7989 Other specified abnormal findings of blood chemistry: Secondary | ICD-10-CM | POA: Diagnosis not present

## 2022-11-27 DIAGNOSIS — H4710 Unspecified papilledema: Secondary | ICD-10-CM

## 2022-11-27 DIAGNOSIS — G4733 Obstructive sleep apnea (adult) (pediatric): Secondary | ICD-10-CM | POA: Diagnosis not present

## 2022-11-27 NOTE — Telephone Encounter (Signed)
Patient notified at ov today.

## 2022-11-27 NOTE — Assessment & Plan Note (Addendum)
She saw neurology for consultation. Had MR brain which was unremarkable back in 2021. Will try to obtain copy of her most recent eye exam.

## 2022-11-27 NOTE — Assessment & Plan Note (Signed)
Patient reports that her mood is improved overall. She has made some friends locally and also is being managed by psychiatry.

## 2022-11-27 NOTE — Patient Instructions (Signed)
VISIT SUMMARY:  During your annual physical, we discussed your ongoing conditions including sarcoidosis, severe sleep apnea, and arthritis. We also talked about your dietary changes, emotional binge eating, and your efforts to increase physical activity. We noted your inconsistent periods, which you attribute to your IUD, and your living situation with two cats, one of which is an emotional support animal.  YOUR PLAN:  -OBSTRUCTIVE SLEEP APNEA: This is a condition where your breathing stops and starts while you sleep. It's important to manage this condition to ensure you're getting enough oxygen during sleep. We recommend reaching out to the pulmonary team for a follow-up sleep study in the lab to adjust your CPAP settings and explore different mask options.  -SARCOIDOSIS: This is a disease involving abnormal collections of inflammatory cells that can form as nodules in multiple organs. We will continue with your current management plan for this condition.  -HYPOTHYROIDISM: This is a condition where your thyroid gland doesn't produce enough thyroid hormones. We will reduce your levothyroxine dose from to daily and repeat thyroid function tests in six weeks.  -MIGRAINES: These are severe headaches often accompanied by nausea and sensitivity to light and sound. We will continue with your current management plan with Excedrin Migraine as needed. If over-the-counter medication becomes ineffective, we may consider prescription therapy.  INSTRUCTIONS:  For your general health maintenance, we recommend obtaining a flu shot and COVID-19 booster at your local pharmacy. Due to your underlying lung condition, you should also consider getting an RSV vaccine. We will repeat liver function tests at your next lab visit in six weeks. Please schedule a follow-up visit in six months.

## 2022-11-27 NOTE — Assessment & Plan Note (Signed)
Lab Results  Component Value Date   TSH 0.25 (L) 11/26/2022   TSH low. Advised pt to decrease synthroid from 137 mcg + 100 mcg daily to 125 mcg + 100 mcg daily. Repeat TSH in 6 weeks.

## 2022-11-27 NOTE — Progress Notes (Signed)
Subjective:     Patient ID: Margaret Golden, female    DOB: Feb 09, 1991, 32 y.o.   MRN: [id number]  No chief complaint on file.   HPI  Discussed the use of AI scribe software for clinical note transcription with the patient, who gave verbal consent to proceed.  History of Present Illness   The patient, with a history of sarcoidosis, severe sleep apnea, and arthritis, presents for an annual physical. She reports wearing a brace for an unspecified condition and experiencing daily leg swelling. She has not followed up on a recommended sleep study for her severe sleep apnea and has not been using a CPAP machine. She reports occasional headaches, about once a week, which she manages with Excedrin Migraine. She also reports migraines about once a week.  The patient has been making dietary changes, focusing on portion control and being mindful of what and when she eats. She reports a history of emotional binge eating, but this has improved with better mental health and a supportive network of friends. She is trying to increase her physical activity, but arthritis in her ankles makes this challenging. She is considering finding a local pool for low-impact exercise.  The patient also reports inconsistent periods, which she attributes to her IUD. Her menstrual bleeding is no longer heavy. She has two cats at home, one of which is an emotional support animal. She reports no changes to family medical history and is not currently working.     Immunizations: Diet: reports that she is no longer calorie counting.   Exercise:  some walking Pap Smear: will schedule Vision: reports up to date Dental: up to date    Health Maintenance Due  Topic Date Due   COVID-19 Vaccine (3 - Pfizer risk series) 08/17/2019   INFLUENZA VACCINE  11/01/2022    Past Medical History:  Diagnosis Date   Acid reflux    ADD (attention deficit disorder)    Anemia    Anxiety    Arthritis    left ankle   Back pain     Chest pain    Complication of anesthesia    Depression    Dyspnea    with exertion   Exercise-induced asthma    as a child   Fatty liver    History of blood transfusion    History of chicken pox    History of kidney stones    passed    Hypothyroidism    Joint pain    Lower extremity edema    Major depressive disorder    Migraines    chronic   Palpitations    Pneumonia    x 2   PONV (postoperative nausea and vomiting)    nausea   RLS (restless legs syndrome)    Sleep apnea 2021   Thyroid disease    hypothyroid    Past Surgical History:  Procedure Laterality Date   BRONCHIAL NEEDLE ASPIRATION BIOPSY  03/31/2020   Procedure: BRONCHIAL NEEDLE ASPIRATION BIOPSIES;  Surgeon: Josephine Igo, DO;  Location: [facility];  Service: Pulmonary;;   BRONCHIAL WASHINGS  03/31/2020   Procedure: BRONCHIAL WASHINGS;  Surgeon: Josephine Igo, DO;  Location: [facility];  Service: Pulmonary;;   DILATATION & CURETTAGE/HYSTEROSCOPY WITH MYOSURE N/A 03/22/2020   Procedure: DILATATION & CURETTAGE/HYSTEROSCOPY WITH MYOSURE;  Surgeon: Romualdo Bolk, MD;  Location: [facility] OR;  Service: Gynecology;  Laterality: N/A;   INTERCOSTAL NERVE BLOCK Right 06/27/2020   Procedure: INTERCOSTAL NERVE BLOCK;  Surgeon: Brynda Greathouse  O, MD;  Location: [facility] OR;  Service: Thoracic;  Laterality: Right;   INTRAUTERINE DEVICE (IUD) INSERTION N/A 03/22/2020   Procedure: INTRAUTERINE DEVICE (IUD) INSERTION;  Surgeon: Romualdo Bolk, MD;  Location: [facility] OR;  Service: Gynecology;  Laterality: N/A;  Mirena IUD insertion   KNEE ARTHROSCOPY Right 2007   LYMPH NODE BIOPSY Right 06/27/2020   Procedure: LYMPH NODE BIOPSY;  Surgeon: Corliss Skains, MD;  Location: [facility] OR;  Service: Thoracic;  Laterality: Right;   TONSILLECTOMY  2010   TONSILLECTOMY     VIDEO BRONCHOSCOPY WITH ENDOBRONCHIAL ULTRASOUND N/A 03/31/2020   Procedure: VIDEO BRONCHOSCOPY WITH ENDOBRONCHIAL ULTRASOUND;  Surgeon: Josephine Igo, DO;   Location: [facility];  Service: Pulmonary;  Laterality: N/A;   WISDOM TOOTH EXTRACTION     WISDOM TOOTH EXTRACTION Bilateral    upper and lower    Family History  Problem Relation Age of Onset   Depression Mother    Obesity Mother        died from "an infection" ? MRSA   Depression Father    Sleep apnea Father    Obesity Father    Arthritis Maternal Grandmother    Breast cancer Maternal Grandmother        in remission   Diabetes Maternal Grandfather    Arthritis Maternal Grandfather    Arthritis Paternal Grandmother    Arthritis Paternal Grandfather    Cancer Paternal Aunt        colon   Cancer Paternal Uncle        brain cancer   Migraines Neg Hx     Social History   Socioeconomic History   Marital status: Single    Spouse name: Not on file   Number of children: 0   Years of education: 12   Highest education level: Not on file  Occupational History   Occupation: Conservation officer, historic buildings  Tobacco Use   Smoking status: Never    Passive exposure: Never   Smokeless tobacco: Never  Vaping Use   Vaping status: Never Used  Substance and Sexual Activity   Alcohol use: Not Currently    Comment: rare social drink maybe once "every 5 blue moons"   Drug use: No   Sexual activity: Never    Comment: never sexually active  Other Topics Concern   Not on file  Social History Narrative   Lives alone --update 11/11/19   Seeking work   Only child    Lives in a one story house, has 2 cats      Caffeine: 2-4 cups/day   Social Determinants of Corporate investment banker Strain: Not on file  Food Insecurity: Not on file  Transportation Needs: Not on file  Physical Activity: Not on file  Stress: Not on file  Social Connections: Not on file  Intimate Partner Violence: Not on file    Outpatient Medications Prior to Visit  Medication Sig Dispense Refill   albuterol (VENTOLIN HFA) 108 (90 Base) MCG/ACT inhaler Inhale 2 puffs into the lungs every 6 (six) hours as needed for  wheezing or shortness of breath. 8 g 2   aspirin-acetaminophen-caffeine (EXCEDRIN MIGRAINE) 250-250-65 MG tablet Take 2 tablets by mouth daily as needed for headache or migraine.     diphenhydrAMINE HCl, Sleep, (ZZZQUIL PO) Take by mouth at bedtime as needed.     folic acid (FOLVITE) 1 MG tablet Take 2 tablets (2 mg total) by mouth daily. 60 tablet 6   furosemide (LASIX) 20 MG tablet TAKE 1  TABLET BY MOUTH TWO TIMES A WEEK 26 tablet 5   gabapentin (NEURONTIN) 300 MG capsule Take 1 capsule (300 mg total) by mouth at bedtime. 90 capsule 1   Iron-FA-B Cmp-C-Biot-Probiotic (FUSION PLUS) CAPS One capsule twice daily with orange juice. Stop your Ferrous sulfate. 30 capsule 1   levonorgestrel (MIRENA, 52 MG,) 20 MCG/DAY IUD 1 each by Intrauterine route once.     levothyroxine (SYNTHROID) 100 MCG tablet TAKE 1 TABLET BY MOUTH DAILY WITH 137 MCG TABLET 90 tablet 1   levothyroxine (SYNTHROID) 125 MCG tablet Take 1 tablet (125 mcg total) by mouth daily. 90 tablet 0   LORazepam (ATIVAN) 0.5 MG tablet Take 1 tablet (0.5 mg total) by mouth every 8 (eight) hours as needed for anxiety. 30 tablet 1   lurasidone (LATUDA) 40 MG TABS tablet Take 1 tablet (40 mg total) by mouth daily with supper. Take 1/2 tablet daily with supper for one week, then increase to 1 tablet daily 90 tablet 1   meloxicam (MOBIC) 7.5 MG tablet Take 1 tablet (7.5 mg total) by mouth daily. 14 tablet 0   methocarbamol (ROBAXIN) 500 MG tablet Take 1 tablet (500 mg total) by mouth every 8 (eight) hours as needed for muscle spasms. 20 tablet 0   methotrexate (RHEUMATREX) 2.5 MG tablet TAKE 6 TABLETS BY MOUTH ONCE WEEKLY PROTECT FROM LIGHT 24 tablet 5   Multiple Vitamins-Minerals (MULTIVITAMIN WITH MINERALS) tablet Take 1 tablet by mouth daily.     nystatin cream (MYCOSTATIN) Apply 1 Application topically 2 (two) times daily. Apply to affected area BID for up to 7 days. 30 g 1   omeprazole (PRILOSEC) 20 MG capsule TAKE 2 CAPSULES BY MOUTH DAILY 180  capsule `   ondansetron (ZOFRAN) 4 MG tablet Take 1 tablet (4 mg total) by mouth every 8 (eight) hours as needed for nausea or vomiting. 20 tablet 0   potassium chloride SA (KLOR-CON) 20 MEQ tablet Take 1 tablet (20 mEq total) by mouth 2 (two) times a week. 26 tablet 0   propranolol (INDERAL) 20 MG tablet Take 1 tablet (20 mg total) by mouth 3 (three) times daily. (Patient taking differently: Take 20 mg by mouth 2 (two) times daily.) 270 tablet 3   sertraline (ZOLOFT) 100 MG tablet Take 2 tablets (200 mg total) by mouth daily. 180 tablet 1   tirzepatide (ZEPBOUND) 2.5 MG/0.5ML Pen Inject 2.5 mg into the skin once a week. 2 mL 2   topiramate (TOPAMAX) 50 MG tablet Start with 50mg (1 tab) at bedtime. In 1 week increase to 100mg (2 tabs) at bedtime. 180 tablet 6   Vitamin D, Ergocalciferol, (DRISDOL) 1.25 MG (50000 UNIT) CAPS capsule Take 1 capsule (50,000 Units total) by mouth 2 (two) times a week. 12 capsule 0   No facility-administered medications prior to visit.    Allergies  Allergen Reactions   Augmentin [Amoxicillin-Pot Clavulanate] Hives    Review of Systems  Constitutional:  Negative for weight loss.  HENT:  Positive for congestion (seasonal allergy drainage). Negative for hearing loss.   Eyes:  Negative for blurred vision.  Respiratory:  Negative for cough.   Cardiovascular:  Positive for leg swelling.  Gastrointestinal:  Negative for constipation and diarrhea.  Genitourinary:  Negative for dysuria and frequency.  Musculoskeletal:  Positive for joint pain (ankles).  Neurological:  Positive for headaches (migraines about once a week, HA's about 3-4 times a week.).  Endo/Heme/Allergies:  Positive for environmental allergies.  Psychiatric/Behavioral:  Denies current depression/anxiety       Objective:    Physical Exam   BP 124/67 (BP Location: Right Arm, Patient Position: Sitting, Cuff Size: Large)   Pulse 74   Temp 98.9 F (37.2 C) (Oral)   Resp 16   Ht 5\' 7"   (1.702 m)   Wt (!) 382 lb (173.3 kg)   SpO2 100%   BMI 59.83 kg/m  Wt Readings from Last 3 Encounters:  11/27/22 (!) 382 lb (173.3 kg)  11/26/22 (!) 383 lb (173.7 kg)  10/23/22 (!) 385 lb (174.6 kg)   Physical Exam  Constitutional: She is oriented to person, place, and time. She appears well-developed and well-nourished. No distress.  HENT:  Head: Normocephalic and atraumatic.  Right Ear: Tympanic membrane and ear canal normal.  Left Ear: Tympanic membrane and ear canal normal.  Mouth/Throat: Oropharynx is clear and moist.  Eyes: Pupils are equal, round, and reactive to light. No scleral icterus.  Neck: Normal range of motion. No thyromegaly present. (Exam limited by habitus) Cardiovascular: Normal rate and regular rhythm.   No murmur heard. Pulmonary/Chest: Effort normal and breath sounds normal. No respiratory distress. He has no wheezes. She has no rales. She exhibits no tenderness.  Abdominal: Soft. Bowel sounds are normal. She exhibits no distension and no mass. There is no tenderness. There is no rebound and no guarding.  Musculoskeletal: She exhibits no edema.  Lymphadenopathy:    She has no cervical adenopathy.  Neurological: She is alert and oriented to person, place, and time. She has normal patellar reflexes. She exhibits normal muscle tone. Coordination normal.  Skin: Skin is warm and dry.  Psychiatric: She has a normal mood and affect. Her behavior is normal. Judgment and thought content normal.  Breast/pelvic: deferred       Assessment & Plan:       Assessment & Plan:   Problem List Items Addressed This Visit       Unprioritized   Preventative health care     -Recommend obtaining flu shot and COVID-19 booster at local pharmacy. -Consider RSV vaccine due to underlying lung condition. -Schedule pap smear with GYN. -Repeat liver function tests/tsh at next lab visit in six weeks. -Schedule follow-up visit in six months.      Papilledema    She saw  neurology for consultation. Had MR brain which was unremarkable back in 2021. Will try to obtain copy of her most recent eye exam.       OSA (obstructive sleep apnea)     Severe sleep apnea noted on previous sleep study. Did not initially tolerate CPAP therapy. -Recommend reaching out to pulmonary team to reschedule follow up split night study to adjust CPAP settings and explore different mask options.      Migraines     Occurring approximately once a week. -Continue current management with Excedrin Migraine as needed. -Consider prescription therapy if over-the-counter medication becomes ineffective. - also, needs treatment for OSA which is a likely contributor.       Major depressive disorder    Patient reports that her mood is improved overall. She has made some friends locally and also is being managed by psychiatry.       Hypothyroidism - Primary    Lab Results  Component Value Date   TSH 0.25 (L) 11/26/2022   TSH low. Advised pt to decrease synthroid from 137 mcg + 100 mcg daily to 125 mcg + 100 mcg daily. Repeat TSH in 6 weeks.  Relevant Orders   TSH   Other Visit Diagnoses     Abnormal LFTs       Relevant Orders   Comp Met (CMET)       I am having Makaelah A. Ammons maintain her multivitamin with minerals, Fusion Plus, topiramate, potassium chloride SA, propranolol, aspirin-acetaminophen-caffeine, furosemide, methocarbamol, Mirena (52 MG), (diphenhydrAMINE HCl, Sleep, (ZZZQUIL PO)), Vitamin D (Ergocalciferol), albuterol, nystatin cream, omeprazole, ondansetron, levothyroxine, lurasidone, methotrexate, folic acid, sertraline, LORazepam, gabapentin, meloxicam, Zepbound, and levothyroxine.  No orders of the defined types were placed in this encounter.

## 2022-11-27 NOTE — Assessment & Plan Note (Addendum)
  Severe sleep apnea noted on previous sleep study. Did not initially tolerate CPAP therapy. -Recommend reaching out to pulmonary team to reschedule follow up split night study to adjust CPAP settings and explore different mask options.

## 2022-11-27 NOTE — Assessment & Plan Note (Signed)
  Occurring approximately once a week. -Continue current management with Excedrin Migraine as needed. -Consider prescription therapy if over-the-counter medication becomes ineffective. - also, needs treatment for OSA which is a likely contributor.

## 2022-11-27 NOTE — Assessment & Plan Note (Signed)
-  Recommend obtaining flu shot and COVID-19 booster at local pharmacy. -Consider RSV vaccine due to underlying lung condition. -Schedule pap smear with GYN. -Repeat liver function tests/tsh at next lab visit in six weeks. -Schedule follow-up visit in six months.

## 2022-11-27 NOTE — Telephone Encounter (Signed)
See mychart.  

## 2022-12-05 ENCOUNTER — Other Ambulatory Visit: Payer: Self-pay | Admitting: Cardiology

## 2022-12-05 DIAGNOSIS — F4323 Adjustment disorder with mixed anxiety and depressed mood: Secondary | ICD-10-CM | POA: Diagnosis not present

## 2022-12-10 DIAGNOSIS — F4323 Adjustment disorder with mixed anxiety and depressed mood: Secondary | ICD-10-CM | POA: Diagnosis not present

## 2022-12-17 DIAGNOSIS — F4323 Adjustment disorder with mixed anxiety and depressed mood: Secondary | ICD-10-CM | POA: Diagnosis not present

## 2022-12-24 DIAGNOSIS — F4323 Adjustment disorder with mixed anxiety and depressed mood: Secondary | ICD-10-CM | POA: Diagnosis not present

## 2022-12-25 ENCOUNTER — Encounter: Payer: Self-pay | Admitting: Psychiatry

## 2022-12-25 ENCOUNTER — Ambulatory Visit (INDEPENDENT_AMBULATORY_CARE_PROVIDER_SITE_OTHER): Payer: BC Managed Care – PPO | Admitting: Psychiatry

## 2022-12-25 VITALS — BP 148/83 | HR 70

## 2022-12-25 DIAGNOSIS — F4010 Social phobia, unspecified: Secondary | ICD-10-CM | POA: Diagnosis not present

## 2022-12-25 DIAGNOSIS — F339 Major depressive disorder, recurrent, unspecified: Secondary | ICD-10-CM | POA: Diagnosis not present

## 2022-12-25 DIAGNOSIS — G2581 Restless legs syndrome: Secondary | ICD-10-CM | POA: Diagnosis not present

## 2022-12-25 DIAGNOSIS — F900 Attention-deficit hyperactivity disorder, predominantly inattentive type: Secondary | ICD-10-CM

## 2022-12-25 MED ORDER — METHYLPHENIDATE HCL 10 MG PO TABS
ORAL_TABLET | ORAL | 0 refills | Status: DC
Start: 2022-12-25 — End: 2023-03-05

## 2022-12-25 MED ORDER — METHYLPHENIDATE HCL 10 MG PO TABS
ORAL_TABLET | ORAL | 0 refills | Status: AC
Start: 2023-01-22 — End: ?

## 2022-12-25 MED ORDER — GABAPENTIN 300 MG PO CAPS
300.0000 mg | ORAL_CAPSULE | Freq: Two times a day (BID) | ORAL | 0 refills | Status: DC
Start: 2022-12-25 — End: 2023-03-05

## 2022-12-25 MED ORDER — LURASIDONE HCL 40 MG PO TABS
40.0000 mg | ORAL_TABLET | Freq: Every day | ORAL | 1 refills | Status: DC
Start: 2022-12-25 — End: 2023-03-05

## 2022-12-25 NOTE — Progress Notes (Signed)
Margaret Golden [id number] 1990-07-07 32 y.o.  Subjective:   Patient ID:  Margaret Golden is a 32 y.o. (DOB 12/24/1990) female.  Chief Complaint:  Chief Complaint  Patient presents with   Follow-up    Anxiety, depression, insomnia   Other    RLS    HPI Margaret Golden presents to the office today for follow-up of anxiety, depression, ADHD, and RLS.   She reports that she has been taking medications more consistently with setting up medication boxes. She reports, "things definitely feel more consistent." She reports that her therapist has commented that her mood seems improved and that notices she has been initiating conversation more, has more energy and a more positive mood and affect. She reports a "little bit" of depression with anniversary of mother's death and thinking about friend that passed away's birthday. Had anxiety due to "drama in my friend group" and took Lorazepam prn. Reports Lorazepam prn caused drowsiness. She reports anxiety has been "more under control." Reports sleep varies. Notices middle of the night awakenings if she goes to bed early. "Mental energy is better" and reports that physical energy is lower. Some slight improvement in motivation. Appetite has been better. Concentration improved with taking Methylphenidate. Reports taking Methylphenidate prn when there are multiple tasks to be completed. Denies SI.   Father came to visit for about a week.   Gabapentin last filled 10/30/22. Lorazepam last filled 09/25/22. Methylphenidate last filled 05/25/22.  Past medication trials: Wellbutrin- initially helpful and then was less helpful Lexapro-ineffective Cymbalta Pristiq Seroquel- does not recall any significant improvement Latuda Abilify Propranolol- prescribed for HR. Not helpful for anxiety Melatonin Vyvanse Adderall XR- not effective Concerta- Notices duration only lasts about 4 hours. No significant improvement with doses less than 54 mg.  [name]-  May have caused n/v and poor concentration Horizant Lamictal-effective for mood Trazodone-effective for insomnia but causes some excessive drowsiness Rexulti- May have caused n/v and poor concentration Ativan- helpful Armodafinil- jaw locked  AIMS    Flowsheet Row Office Visit from 05/25/2022 in [facility] Office Visit from 09/28/2021 in [facility]  AIMS Total Score 4 6      GAD-7    Flowsheet Row Office Visit from 06/20/2022 in [facility] Office Visit from 09/25/2021 in [facility] at [facility] Office Visit from 11/16/2020 in [facility] at [facility]  Total GAD-7 Score 14 17 19       PHQ2-9    Flowsheet Row Office Visit from 11/26/2022 in [facility] Primary [facility] at [facility] Office Visit from 06/20/2022 in [facility] Care at [facility] Office Visit from 09/25/2021 in [facility] at [facility] Office Visit from 11/16/2020 in [facility] Primary Care at [facility] Office Visit from 09/03/2019 in [facility] Total Score 1 4 6 6 3   PHQ-9 Total Score 5 19 20 20 14       Flowsheet Row ED from 09/20/2021 in [facility] Emergency Department at [facility] Admission (Discharged) from 06/27/2020 in [facility] 4E CV SURGICAL PROGRESSIVE CARE Pre-Admission Testing 60 from 06/23/2020 in [facility] PREADMISSION TESTING  C-SSRS RISK CATEGORY No Risk No Risk No Risk        Review of Systems:  Review of Systems  Respiratory:  Positive  for shortness of breath.   Cardiovascular:  Negative for palpitations.  Musculoskeletal:  Negative for gait problem.       Ankle pain  Neurological:  Negative for tremors.  Psychiatric/Behavioral:         Please refer to HPI    Medications: I  have reviewed the patient's current medications.  Current Outpatient Medications  Medication Sig Dispense Refill   levothyroxine (SYNTHROID) 100 MCG tablet TAKE 1 TABLET BY MOUTH DAILY WITH 137 MCG TABLET 90 tablet 1   levothyroxine (SYNTHROID) 125 MCG tablet Take 1 tablet (125 mcg total) by mouth daily. 90 tablet 0   [START ON 01/22/2023] methylphenidate (RITALIN) 10 MG tablet Take 1/2-1 tablet daily as needed 30 tablet 0   albuterol (VENTOLIN HFA) 108 (90 Base) MCG/ACT inhaler Inhale 2 puffs into the lungs every 6 (six) hours as needed for wheezing or shortness of breath. 8 g 2   aspirin-acetaminophen-caffeine (EXCEDRIN MIGRAINE) 250-250-65 MG tablet Take 2 tablets by mouth daily as needed for headache or migraine.     diphenhydrAMINE HCl, Sleep, (ZZZQUIL PO) Take by mouth at bedtime as needed.     folic acid (FOLVITE) 1 MG tablet Take 2 tablets (2 mg total) by mouth daily. 60 tablet 6   furosemide (LASIX) 20 MG tablet TAKE 1 TABLET BY MOUTH TWO TIMES A WEEK 26 tablet 5   gabapentin (NEURONTIN) 300 MG capsule Take 1 capsule (300 mg total) by mouth 2 (two) times daily. 180 capsule 0   Iron-FA-B Cmp-C-Biot-Probiotic (FUSION PLUS) CAPS One capsule twice daily with orange juice. Stop your Ferrous sulfate. 30 capsule 1   levonorgestrel (MIRENA, 52 MG,) 20 MCG/DAY IUD 1 each by Intrauterine route once.     LORazepam (ATIVAN) 0.5 MG tablet Take 1 tablet (0.5 mg total) by mouth every 8 (eight) hours as needed for anxiety. 30 tablet 1   lurasidone (LATUDA) 40 MG TABS tablet Take 1 tablet (40 mg total) by mouth daily with supper. 90 tablet 1   meloxicam (MOBIC) 7.5 MG tablet Take 1 tablet (7.5 mg total) by mouth daily. 14 tablet 0   methocarbamol (ROBAXIN) 500 MG tablet Take 1 tablet (500 mg total) by mouth every 8 (eight) hours as needed for muscle spasms. 20 tablet 0   methotrexate (RHEUMATREX) 2.5 MG tablet TAKE 6 TABLETS BY MOUTH ONCE WEEKLY PROTECT FROM LIGHT 24 tablet 5   methylphenidate (RITALIN)  10 MG tablet Take 1/2-1 tablet daily as needed 30 tablet 0   Multiple Vitamins-Minerals (MULTIVITAMIN WITH MINERALS) tablet Take 1 tablet by mouth daily.     nystatin cream (MYCOSTATIN) Apply 1 Application topically 2 (two) times daily. Apply to affected area BID for up to 7 days. 30 g 1   omeprazole (PRILOSEC) 20 MG capsule TAKE 2 CAPSULES BY MOUTH DAILY 180 capsule `   ondansetron (ZOFRAN) 4 MG tablet Take 1 tablet (4 mg total) by mouth every 8 (eight) hours as needed for nausea or vomiting. 20 tablet 0   potassium chloride SA (KLOR-CON) 20 MEQ tablet Take 1 tablet (20 mEq total) by mouth 2 (two) times a week. 26 tablet 0   propranolol (INDERAL) 20 MG tablet TAKE 1 TABLET (20 MG TOTAL) BY MOUTH 3 (THREE) TIMES DAILY. 270 tablet 3   sertraline (ZOLOFT) 100 MG tablet Take 2 tablets (200 mg total) by mouth daily. 180 tablet 1   tirzepatide (ZEPBOUND) 2.5 MG/0.5ML Pen Inject 2.5 mg into the skin once a week. (Patient not taking: Reported on 12/25/2022) 2 mL  2   topiramate (TOPAMAX) 50 MG tablet Start with 50mg (1 tab) at bedtime. In 1 week increase to 100mg (2 tabs) at bedtime. 180 tablet 6   Vitamin D, Ergocalciferol, (DRISDOL) 1.25 MG (50000 UNIT) CAPS capsule Take 1 capsule (50,000 Units total) by mouth 2 (two) times a week. 12 capsule 0   No current facility-administered medications for this visit.    Medication Side Effects: None  Allergies:  Allergies  Allergen Reactions   Augmentin [Amoxicillin-Pot Clavulanate] Hives    Past Medical History:  Diagnosis Date   Acid reflux    ADD (attention deficit disorder)    Anemia    Anxiety    Arthritis    left ankle   Back pain    Chest pain    Complication of anesthesia    Depression    Dyspnea    with exertion   Exercise-induced asthma    as a child   Fatty liver    History of blood transfusion    History of chicken pox    History of kidney stones    passed    Hypothyroidism    Joint pain    Lower extremity edema    Major  depressive disorder    Migraines    chronic   Palpitations    Pneumonia    x 2   PONV (postoperative nausea and vomiting)    nausea   RLS (restless legs syndrome)    Sleep apnea 2021   Thyroid disease    hypothyroid    Past Medical History, Surgical history, Social history, and Family history were reviewed and updated as appropriate.   Please see review of systems for further details on the patient's review from today.   Objective:   Physical Exam:  BP (!) 148/83   Pulse 70   Physical Exam Constitutional:      General: She is not in acute distress. Musculoskeletal:        General: No deformity.  Neurological:     Mental Status: She is alert and oriented to person, place, and time.     Coordination: Coordination normal.  Psychiatric:        Attention and Perception: Attention and perception normal. She does not perceive auditory or visual hallucinations.        Mood and Affect: Affect is not labile, blunt, angry or inappropriate.        Speech: Speech normal.        Behavior: Behavior normal.        Thought Content: Thought content normal. Thought content is not paranoid or delusional. Thought content does not include homicidal or suicidal ideation. Thought content does not include homicidal or suicidal plan.        Cognition and Memory: Cognition and memory normal.        Judgment: Judgment normal.     Comments: Insight intact Mood presents as less depressed and less anxious compared to previous exams     Lab Review:     Component Value Date/Time   NA 136 07/31/2022 1501   K 3.9 07/31/2022 1501   CL 101 07/31/2022 1501   CO2 25 07/31/2022 1501   GLUCOSE 101 (H) 07/31/2022 1501   BUN 9 07/31/2022 1501   CREATININE 0.71 07/31/2022 1501   CREATININE 0.74 09/30/2019 1047   CREATININE 0.75 07/06/2016 1534   CALCIUM 9.4 07/31/2022 1501   PROT 7.8 07/31/2022 1501   ALBUMIN 4.2 07/31/2022 1501   AST 54 (H) 07/31/2022 1501  AST 27 09/30/2019 1047   ALT 28  07/31/2022 1501   ALT 14 09/30/2019 1047   ALKPHOS 82 07/31/2022 1501   BILITOT 0.9 07/31/2022 1501   BILITOT 0.4 09/30/2019 1047   GFRNONAA >60 06/28/2020 0703   GFRNONAA >60 09/30/2019 1047   GFRAA >60 09/30/2019 1047       Component Value Date/Time   WBC 7.9 07/31/2022 1501   RBC 4.88 07/31/2022 1501   HGB 14.6 07/31/2022 1501   HGB 13.4 02/14/2022 1253   HGB 9.3 (L) 08/26/2019 1045   HCT 42.7 07/31/2022 1501   HCT 33.4 (L) 08/26/2019 1045   PLT 188.0 07/31/2022 1501   PLT 128 (L) 02/14/2022 1253   PLT 234 08/26/2019 1045   MCV 87.5 07/31/2022 1501   MCV 72 (L) 08/26/2019 1045   MCH 29.6 02/14/2022 1253   MCHC 34.3 07/31/2022 1501   RDW 14.0 07/31/2022 1501   RDW 15.4 08/26/2019 1045   LYMPHSABS 1.0 06/20/2022 1613   MONOABS 0.3 06/20/2022 1613   EOSABS 0.2 06/20/2022 1613   BASOSABS 0.0 06/20/2022 1613    No results found for: "POCLITH", "LITHIUM"   No results found for: "PHENYTOIN", "PHENOBARB", "VALPROATE", "CBMZ"   .res Assessment: Plan:    Will increase Gabapentin to 300 mg twice daily to improve RLS during the daytime.  Continue Ritalin 10 mg 1/2-1 tablet daily as needed since she reports that this is helpful on days that she has activities and tasks that require more focus.  Continue Latuda 40 mg daily with supper for mood symptoms.  Continue Sertraline 200 mg daily for depression and anxiety.  Continue Lorazepam prn anxiety. Discussed that she may wish to try taking 1/2 tablet instead of 1 tablet since she reports that one 0.5 mg tablet recently caused drowsiness.  Recommend continuing psychotherapy.  Pt to follow-up in 2 months or sooner if clinically indicated.  Patient advised to contact office with any questions, adverse effects, or acute worsening in signs and symptoms.   Etsuko was seen today for follow-up and other.  Diagnoses and all orders for this visit:  Attention deficit hyperactivity disorder (ADHD), predominantly inattentive type -      methylphenidate (RITALIN) 10 MG tablet; Take 1/2-1 tablet daily as needed -     methylphenidate (RITALIN) 10 MG tablet; Take 1/2-1 tablet daily as needed  Social phobia -     gabapentin (NEURONTIN) 300 MG capsule; Take 1 capsule (300 mg total) by mouth 2 (two) times daily.  RLS (restless legs syndrome) -     gabapentin (NEURONTIN) 300 MG capsule; Take 1 capsule (300 mg total) by mouth 2 (two) times daily.  Major depression, recurrent, chronic (HCC) -     lurasidone (LATUDA) 40 MG TABS tablet; Take 1 tablet (40 mg total) by mouth daily with supper.     Please see After Visit Summary for patient specific instructions.  Future Appointments  Date Time Provider Department Center  01/01/2023  1:30 PM Darrell Jewel V, MD [facility] None  01/08/2023  1:15 PM [facility]  02/13/2023  1:00 PM [facility] None  02/13/2023  1:15 PM Rushie Chestnut, PA-C [facility] None  03/05/2023  1:15 PM Corie Chiquito, PMHNP [facility] None  05/31/2023  1:20 PM [name], NP [facility]    No orders of the defined types were placed in this encounter.   -------------------------------

## 2022-12-26 ENCOUNTER — Ambulatory Visit: Payer: BC Managed Care – PPO | Admitting: Psychiatry

## 2022-12-31 DIAGNOSIS — F4323 Adjustment disorder with mixed anxiety and depressed mood: Secondary | ICD-10-CM | POA: Diagnosis not present

## 2023-01-01 ENCOUNTER — Encounter: Payer: Self-pay | Admitting: Obstetrics and Gynecology

## 2023-01-01 ENCOUNTER — Ambulatory Visit (INDEPENDENT_AMBULATORY_CARE_PROVIDER_SITE_OTHER): Payer: BC Managed Care – PPO | Admitting: Obstetrics and Gynecology

## 2023-01-01 VITALS — BP 128/74 | HR 78 | Ht 67.0 in | Wt 374.4 lb

## 2023-01-01 DIAGNOSIS — Z30431 Encounter for routine checking of intrauterine contraceptive device: Secondary | ICD-10-CM | POA: Insufficient documentation

## 2023-01-01 DIAGNOSIS — Z01419 Encounter for gynecological examination (general) (routine) without abnormal findings: Secondary | ICD-10-CM | POA: Diagnosis not present

## 2023-01-01 DIAGNOSIS — B372 Candidiasis of skin and nail: Secondary | ICD-10-CM | POA: Insufficient documentation

## 2023-01-01 MED ORDER — NYSTATIN 100000 UNIT/GM EX POWD
1.0000 | Freq: Two times a day (BID) | CUTANEOUS | 1 refills | Status: AC | PRN
Start: 2023-01-01 — End: ?

## 2023-01-01 MED ORDER — CLOTRIMAZOLE-BETAMETHASONE 1-0.05 % EX CREA
1.0000 | TOPICAL_CREAM | Freq: Two times a day (BID) | CUTANEOUS | 1 refills | Status: AC
Start: 2023-01-01 — End: 2023-01-15

## 2023-01-01 NOTE — Assessment & Plan Note (Signed)
Extensive rash of the inguinal fold/pannus. Topical therapy prescribed RTO in 2 wk to follow-up

## 2023-01-01 NOTE — Progress Notes (Signed)
32 y.o. G0P0000 female with sarcoidosis, morbid obesity, Mirena IUD (placed 2021 for AUB) and multiple other comorbidites here for annual exam.  She states that she has a " Heat rash" is her vaginal area. Using OTC antifungal cream.  No LMP recorded. (Menstrual status: IUD).  She is having irregular periods with her IUD.   Pelvic discharge: none Pelvic pain: none Birth control: Mirena IUD  Sexually active: not  Exercising: none     Component Value Date/Time   DIAGPAP  05/19/2019 1356    - Negative for intraepithelial lesion or malignancy (NILM)   HPVHIGH Negative 05/19/2019 1356   ADEQPAP  05/19/2019 1356    Satisfactory for evaluation; transformation zone component PRESENT.    GYN HISTORY:    Component Value Date/Time   DIAGPAP  05/19/2019 1356    - Negative for intraepithelial lesion or malignancy (NILM)   HPVHIGH Negative 05/19/2019 1356   ADEQPAP  05/19/2019 1356    Satisfactory for evaluation; transformation zone component PRESENT.    OB History  Gravida Para Term Preterm AB Living  0 0 0 0 0 0  SAB IAB Ectopic Multiple Live Births  0 0 0 0 0    Past Medical History:  Diagnosis Date   Acid reflux    ADD (attention deficit disorder)    Anemia    Anxiety    Arthritis    left ankle   Back pain    Chest pain    Complication of anesthesia    Depression    Dyspnea    with exertion   Exercise-induced asthma    as a child   Fatty liver    History of blood transfusion    History of chicken pox    History of kidney stones    passed    Hypothyroidism    Joint pain    Lower extremity edema    Major depressive disorder    Migraines    chronic   Palpitations    Pneumonia    x 2   PONV (postoperative nausea and vomiting)    nausea   RLS (restless legs syndrome)    Sleep apnea 2021   Thyroid disease    hypothyroid    Past Surgical History:  Procedure Laterality Date   BRONCHIAL NEEDLE ASPIRATION BIOPSY  03/31/2020   Procedure: BRONCHIAL NEEDLE  ASPIRATION BIOPSIES;  Surgeon: Josephine Igo, DO;  Location: [facility];  Service: Pulmonary;;   BRONCHIAL WASHINGS  03/31/2020   Procedure: BRONCHIAL WASHINGS;  Surgeon: Josephine Igo, DO;  Location: [facility];  Service: Pulmonary;;   DILATATION & CURETTAGE/HYSTEROSCOPY WITH MYOSURE N/A 03/22/2020   Procedure: DILATATION & CURETTAGE/HYSTEROSCOPY WITH MYOSURE;  Surgeon: Romualdo Bolk, MD;  Location: [facility] OR;  Service: Gynecology;  Laterality: N/A;   INTERCOSTAL NERVE BLOCK Right 06/27/2020   Procedure: INTERCOSTAL NERVE BLOCK;  Surgeon: Corliss Skains, MD;  Location: [facility] OR;  Service: Thoracic;  Laterality: Right;   INTRAUTERINE DEVICE (IUD) INSERTION N/A 03/22/2020   Procedure: INTRAUTERINE DEVICE (IUD) INSERTION;  Surgeon: Romualdo Bolk, MD;  [facility] OR;  Service: Gynecology;  Laterality: N/A;  Mirena IUD insertion   KNEE ARTHROSCOPY Right 2007   LYMPH NODE BIOPSY Right 06/27/2020   Procedure: LYMPH NODE BIOPSY;  Surgeon: Corliss Skains, MD;  Location: [facility] OR;  Service: Thoracic;  Laterality: Right;   TONSILLECTOMY  2010   TONSILLECTOMY     VIDEO BRONCHOSCOPY WITH ENDOBRONCHIAL ULTRASOUND N/A 03/31/2020   Procedure: VIDEO BRONCHOSCOPY WITH  ENDOBRONCHIAL ULTRASOUND;  Surgeon: Josephine Igo, DO;  Location: [facility];  Service: Pulmonary;  Laterality: N/A;   WISDOM TOOTH EXTRACTION     WISDOM TOOTH EXTRACTION Bilateral    upper and lower    Current Outpatient Medications on File Prior to Visit  Medication Sig Dispense Refill   albuterol (VENTOLIN HFA) 108 (90 Base) MCG/ACT inhaler Inhale 2 puffs into the lungs every 6 (six) hours as needed for wheezing or shortness of breath. 8 g 2   aspirin-acetaminophen-caffeine (EXCEDRIN MIGRAINE) 250-250-65 MG tablet Take 2 tablets by mouth daily as needed for headache or migraine.     diphenhydrAMINE HCl, Sleep, (ZZZQUIL PO) Take by mouth at bedtime as needed.     folic acid (FOLVITE) 1 MG tablet Take 2 tablets  (2 mg total) by mouth daily. 60 tablet 6   furosemide (LASIX) 20 MG tablet TAKE 1 TABLET BY MOUTH TWO TIMES A WEEK 26 tablet 5   gabapentin (NEURONTIN) 300 MG capsule Take 1 capsule (300 mg total) by mouth 2 (two) times daily. 180 capsule 0   Iron-FA-B Cmp-C-Biot-Probiotic (FUSION PLUS) CAPS One capsule twice daily with orange juice. Stop your Ferrous sulfate. 30 capsule 1   levonorgestrel (MIRENA, 52 MG,) 20 MCG/DAY IUD 1 each by Intrauterine route once.     levothyroxine (SYNTHROID) 100 MCG tablet TAKE 1 TABLET BY MOUTH DAILY WITH 137 MCG TABLET 90 tablet 1   levothyroxine (SYNTHROID) 125 MCG tablet Take 1 tablet (125 mcg total) by mouth daily. 90 tablet 0   LORazepam (ATIVAN) 0.5 MG tablet Take 1 tablet (0.5 mg total) by mouth every 8 (eight) hours as needed for anxiety. 30 tablet 1   lurasidone (LATUDA) 40 MG TABS tablet Take 1 tablet (40 mg total) by mouth daily with supper. 90 tablet 1   meloxicam (MOBIC) 7.5 MG tablet Take 1 tablet (7.5 mg total) by mouth daily. 14 tablet 0   methocarbamol (ROBAXIN) 500 MG tablet Take 1 tablet (500 mg total) by mouth every 8 (eight) hours as needed for muscle spasms. 20 tablet 0   methotrexate (RHEUMATREX) 2.5 MG tablet TAKE 6 TABLETS BY MOUTH ONCE WEEKLY PROTECT FROM LIGHT 24 tablet 5   methylphenidate (RITALIN) 10 MG tablet Take 1/2-1 tablet daily as needed 30 tablet 0   Multiple Vitamins-Minerals (MULTIVITAMIN WITH MINERALS) tablet Take 1 tablet by mouth daily.     omeprazole (PRILOSEC) 20 MG capsule TAKE 2 CAPSULES BY MOUTH DAILY 180 capsule `   ondansetron (ZOFRAN) 4 MG tablet Take 1 tablet (4 mg total) by mouth every 8 (eight) hours as needed for nausea or vomiting. 20 tablet 0   potassium chloride SA (KLOR-CON) 20 MEQ tablet Take 1 tablet (20 mEq total) by mouth 2 (two) times a week. 26 tablet 0   propranolol (INDERAL) 20 MG tablet TAKE 1 TABLET (20 MG TOTAL) BY MOUTH 3 (THREE) TIMES DAILY. 270 tablet 3   sertraline (ZOLOFT) 100 MG tablet Take 2  tablets (200 mg total) by mouth daily. 180 tablet 1   topiramate (TOPAMAX) 50 MG tablet Start with 50mg (1 tab) at bedtime. In 1 week increase to 100mg (2 tabs) at bedtime. 180 tablet 6   Vitamin D, Ergocalciferol, (DRISDOL) 1.25 MG (50000 UNIT) CAPS capsule Take 1 capsule (50,000 Units total) by mouth 2 (two) times a week. 12 capsule 0   No current facility-administered medications on file prior to visit.    Social History   Socioeconomic History   Marital status: Single    Spouse  name: Not on file   Number of children: 0   Years of education: 12   Highest education level: Not on file  Occupational History   Occupation: Conservation officer, historic buildings  Tobacco Use   Smoking status: Never    Passive exposure: Never   Smokeless tobacco: Never  Vaping Use   Vaping status: Never Used  Substance and Sexual Activity   Alcohol use: Not Currently    Comment: rare social drink maybe once "every 5 blue moons"   Drug use: No   Sexual activity: Never    Comment: never sexually active  Other Topics Concern   Not on file  Social History Narrative   Lives alone --update 11/11/19   Seeking work   Only child    Lives in a one story house, has 2 cats      Caffeine: 2-4 cups/day   Social Determinants of Corporate investment banker Strain: Not on file  Food Insecurity: Not on file  Transportation Needs: Not on file  Physical Activity: Not on file  Stress: Not on file  Social Connections: Not on file  Intimate Partner Violence: Not on file    Family History  Problem Relation Age of Onset   Depression Mother    Obesity Mother        died from "an infection" ? MRSA   Depression Father    Sleep apnea Father    Obesity Father    Arthritis Maternal Grandmother    Breast cancer Maternal Grandmother        in remission   Diabetes Maternal Grandfather    Arthritis Maternal Grandfather    Arthritis Paternal Grandmother    Arthritis Paternal Grandfather    Cancer Paternal Aunt        colon    Cancer Paternal Uncle        brain cancer   Migraines Neg Hx     Allergies  Allergen Reactions   Augmentin [Amoxicillin-Pot Clavulanate] Hives      PE Today's Vitals   01/01/23 1332  BP: 128/74  Pulse: 78  SpO2: 100%  Weight: (!) 374 lb 6.4 oz (169.8 kg)  Height: 5\' 7"  (1.702 m)   Body mass index is 58.64 kg/m.  Physical Exam Vitals reviewed. Exam conducted with a chaperone present.  Constitutional:      General: She is not in acute distress.    Appearance: Normal appearance.  HENT:     Head: Normocephalic and atraumatic.     Nose: Nose normal.  Eyes:     Extraocular Movements: Extraocular movements intact.     Conjunctiva/sclera: Conjunctivae normal.  Pulmonary:     Effort: Pulmonary effort is normal.  Chest:     Chest wall: No mass or tenderness.  Breasts:    Right: Normal. No swelling, mass, nipple discharge or tenderness.     Left: Normal. No swelling, mass, nipple discharge or tenderness.  Abdominal:     General: There is no distension.     Palpations: Abdomen is soft.     Tenderness: There is no abdominal tenderness.    Genitourinary:    General: Normal vulva.     Exam position: Lithotomy position.     Labia:        Right: No rash, tenderness or lesion.        Left: No rash, tenderness or lesion.      Urethra: No prolapse.     Vagina: Normal. No vaginal discharge or bleeding.  Cervix: Normal. No lesion.     Uterus: Normal. Not tender.      Adnexa: Right adnexa normal and left adnexa normal.     Comments: IUD strings present. Bimanual exam limited by habitus. Musculoskeletal:        General: Normal range of motion.     Cervical back: Normal range of motion.  Lymphadenopathy:     Lower Body: No right inguinal adenopathy. No left inguinal adenopathy.  Skin:    General: Skin is warm and dry.  Neurological:     General: No focal deficit present.     Mental Status: She is alert.  Psychiatric:        Mood and Affect: Mood normal.         Behavior: Behavior normal.      Assessment and Plan:        Well woman exam with routine gynecological exam Assessment & Plan: Pap smear UTD MMG, colonoscopy, DXA N/A Labs and immunizations with her primary Discussed breast self exams Encouraged safe sexual practices and enouraged healthy lifestyle practices with diet and exercise    IUD check up Assessment & Plan: Normal string check   Candidal intertrigo -     Nystatin; Apply 1 Application topically 2 (two) times daily as needed.  Dispense: 60 g; Refill: 1 -     Clotrimazole-Betamethasone; Apply 1 Application topically 2 (two) times daily for 14 days. To abdominal fold  Dispense: 45 g; Refill: 1    Rosalyn Gess, MD

## 2023-01-01 NOTE — Assessment & Plan Note (Addendum)
Normal string check Continue Mirena 5-8y.

## 2023-01-01 NOTE — Assessment & Plan Note (Signed)
Pap smear UTD MMG, colonoscopy, DXA N/A Labs and immunizations with her primary Discussed breast self exams Encouraged safe sexual practices and enouraged healthy lifestyle practices with diet and exercise

## 2023-01-01 NOTE — Patient Instructions (Signed)

## 2023-01-03 ENCOUNTER — Other Ambulatory Visit: Payer: Self-pay | Admitting: Family

## 2023-01-07 DIAGNOSIS — F4323 Adjustment disorder with mixed anxiety and depressed mood: Secondary | ICD-10-CM | POA: Diagnosis not present

## 2023-01-08 ENCOUNTER — Other Ambulatory Visit (INDEPENDENT_AMBULATORY_CARE_PROVIDER_SITE_OTHER): Payer: BC Managed Care – PPO

## 2023-01-08 DIAGNOSIS — E039 Hypothyroidism, unspecified: Secondary | ICD-10-CM

## 2023-01-08 DIAGNOSIS — R7989 Other specified abnormal findings of blood chemistry: Secondary | ICD-10-CM | POA: Diagnosis not present

## 2023-01-09 ENCOUNTER — Telehealth: Payer: Self-pay | Admitting: Family

## 2023-01-09 DIAGNOSIS — E039 Hypothyroidism, unspecified: Secondary | ICD-10-CM

## 2023-01-09 LAB — COMPREHENSIVE METABOLIC PANEL
ALT: 27 U/L (ref 0–35)
AST: 46 U/L — ABNORMAL HIGH (ref 0–37)
Albumin: 3.9 g/dL (ref 3.5–5.2)
Alkaline Phosphatase: 100 U/L (ref 39–117)
BUN: 11 mg/dL (ref 6–23)
CO2: 27 meq/L (ref 19–32)
Calcium: 8.9 mg/dL (ref 8.4–10.5)
Chloride: 103 meq/L (ref 96–112)
Creatinine, Ser: 0.73 mg/dL (ref 0.40–1.20)
GFR: 108.5 mL/min (ref >60.00)
Glucose, Bld: 123 mg/dL — ABNORMAL HIGH (ref 70–99)
Potassium: 3.7 meq/L (ref 3.5–5.1)
Sodium: 140 meq/L (ref 135–145)
Total Bilirubin: 0.5 mg/dL (ref 0.2–1.2)
Total Protein: 6.2 g/dL (ref 6.0–8.3)

## 2023-01-09 LAB — TSH: TSH: 0.02 u[IU]/mL — ABNORMAL LOW (ref 0.35–5.50)

## 2023-01-09 MED ORDER — LEVOTHYROXINE SODIUM 200 MCG PO TABS
200.0000 ug | ORAL_TABLET | Freq: Every day | ORAL | 0 refills | Status: DC
Start: 2023-01-09 — End: 2023-04-17

## 2023-01-09 NOTE — Telephone Encounter (Signed)
Please advise pt that her lab work shows that her thyroid medicine is still too strong.  She is currently on synthroid 100 mcg + synthroid 125 mcg.  I would like for her to change to once daily and repeat tsh in 6 weeks.

## 2023-01-10 NOTE — Telephone Encounter (Signed)
Patient notified of results, new dose adjustment and scheduled to follow up in 6 weeks.

## 2023-01-15 DIAGNOSIS — F4323 Adjustment disorder with mixed anxiety and depressed mood: Secondary | ICD-10-CM | POA: Diagnosis not present

## 2023-01-16 ENCOUNTER — Ambulatory Visit: Payer: BC Managed Care – PPO | Admitting: Obstetrics and Gynecology

## 2023-01-16 NOTE — Progress Notes (Deleted)
32 y.o. G0P0000 female with sarcoidosis, morbid obesity, Mirena IUD (placed 2021 for AUB) and multiple other comorbidites here for f/u candidal intertrigo of pannus. No LMP recorded. (Menstrual status: IUD).     GYN HISTORY:    Component Value Date/Time   DIAGPAP  05/19/2019 1356    - Negative for intraepithelial lesion or malignancy (NILM)   HPVHIGH Negative 05/19/2019 1356   ADEQPAP  05/19/2019 1356    Satisfactory for evaluation; transformation zone component PRESENT.    OB History  Gravida Para Term Preterm AB Living  0 0 0 0 0 0  SAB IAB Ectopic Multiple Live Births  0 0 0 0 0    Past Medical History:  Diagnosis Date   Acid reflux    ADD (attention deficit disorder)    Anemia    Anxiety    Arthritis    left ankle   Back pain    Chest pain    Complication of anesthesia    Depression    Dyspnea    with exertion   Exercise-induced asthma    as a child   Fatty liver    History of blood transfusion    History of chicken pox    History of kidney stones    passed    Hypothyroidism    Joint pain    Lower extremity edema    Major depressive disorder    Migraines    chronic   Palpitations    Pneumonia    x 2   PONV (postoperative nausea and vomiting)    nausea   RLS (restless legs syndrome)    Sleep apnea 2021   Thyroid disease    hypothyroid    Past Surgical History:  Procedure Laterality Date   BRONCHIAL NEEDLE ASPIRATION BIOPSY  03/31/2020   Procedure: BRONCHIAL NEEDLE ASPIRATION BIOPSIES;  Surgeon: Josephine Igo, DO;  Location: [facility];  Service: Pulmonary;;   BRONCHIAL WASHINGS  03/31/2020   Procedure: BRONCHIAL WASHINGS;  Surgeon: Josephine Igo, DO;  Location: [facility];  Service: Pulmonary;;   DILATATION & CURETTAGE/HYSTEROSCOPY WITH MYOSURE N/A 03/22/2020   Procedure: DILATATION & CURETTAGE/HYSTEROSCOPY WITH MYOSURE;  Surgeon: Romualdo Bolk, MD;  Location: [facility];  Service: Gynecology;  Laterality: N/A;   INTERCOSTAL NERVE  BLOCK Right 06/27/2020   Procedure: INTERCOSTAL NERVE BLOCK;  Surgeon: Corliss Skains, MD;  Location: [facility];  Service: Thoracic;  Laterality: Right;   INTRAUTERINE DEVICE (IUD) INSERTION N/A 03/22/2020   Procedure: INTRAUTERINE DEVICE (IUD) INSERTION;  Surgeon: Romualdo Bolk, MD;  Location: [facility] OR;  Service: Gynecology;  Laterality: N/A;  Mirena IUD insertion   KNEE ARTHROSCOPY Right 2007   LYMPH NODE BIOPSY Right 06/27/2020   Procedure: LYMPH NODE BIOPSY;  Surgeon: Corliss Skains, MD;  Location: [facility];  Service: Thoracic;  Laterality: Right;   TONSILLECTOMY  2010   TONSILLECTOMY     VIDEO BRONCHOSCOPY WITH ENDOBRONCHIAL ULTRASOUND N/A 03/31/2020   Procedure: VIDEO BRONCHOSCOPY WITH ENDOBRONCHIAL ULTRASOUND;  Surgeon: Josephine Igo, DO;  Location: [facility];  Service: Pulmonary;  Laterality: N/A;   WISDOM TOOTH EXTRACTION     WISDOM TOOTH EXTRACTION Bilateral    upper and lower    Current Outpatient Medications on File Prior to Visit  Medication Sig Dispense Refill   albuterol (VENTOLIN HFA) 108 (90 Base) MCG/ACT inhaler Inhale 2 puffs into the lungs every 6 (six) hours as needed for wheezing or shortness of breath. 8 g 2   aspirin-acetaminophen-caffeine (EXCEDRIN MIGRAINE)  250-250-65 MG tablet Take 2 tablets by mouth daily as needed for headache or migraine.     diphenhydrAMINE HCl, Sleep, (ZZZQUIL PO) Take by mouth at bedtime as needed.     folic acid (FOLVITE) 1 MG tablet Take 2 tablets (2 mg total) by mouth daily. 60 tablet 6   furosemide (LASIX) 20 MG tablet TAKE 1 TABLET BY MOUTH TWO TIMES A WEEK 26 tablet 5   gabapentin (NEURONTIN) 300 MG capsule Take 1 capsule (300 mg total) by mouth 2 (two) times daily. 180 capsule 0   Iron-FA-B Cmp-C-Biot-Probiotic (FUSION PLUS) CAPS One capsule twice daily with orange juice. Stop your Ferrous sulfate. 30 capsule 1   levonorgestrel (MIRENA, 52 MG,) 20 MCG/DAY IUD 1 each by Intrauterine route once.     levothyroxine  (SYNTHROID) 200 MCG tablet Take 1 tablet (200 mcg total) by mouth daily. 90 tablet 0   LORazepam (ATIVAN) 0.5 MG tablet Take 1 tablet (0.5 mg total) by mouth every 8 (eight) hours as needed for anxiety. 30 tablet 1   lurasidone (LATUDA) 40 MG TABS tablet Take 1 tablet (40 mg total) by mouth daily with supper. 90 tablet 1   meloxicam (MOBIC) 7.5 MG tablet Take 1 tablet (7.5 mg total) by mouth daily. 14 tablet 0   methocarbamol (ROBAXIN) 500 MG tablet Take 1 tablet (500 mg total) by mouth every 8 (eight) hours as needed for muscle spasms. 20 tablet 0   methotrexate (RHEUMATREX) 2.5 MG tablet TAKE 6 TABLETS BY MOUTH ONCE WEEKLY PROTECT FROM LIGHT 24 tablet 5   methylphenidate (RITALIN) 10 MG tablet Take 1/2-1 tablet daily as needed 30 tablet 0   Multiple Vitamins-Minerals (MULTIVITAMIN WITH MINERALS) tablet Take 1 tablet by mouth daily.     nystatin (MYCOSTATIN/NYSTOP) powder Apply 1 Application topically 2 (two) times daily as needed. 60 g 1   omeprazole (PRILOSEC) 20 MG capsule Take 2 capsules (40 mg total) by mouth daily. 180 capsule 1   ondansetron (ZOFRAN) 4 MG tablet Take 1 tablet (4 mg total) by mouth every 8 (eight) hours as needed for nausea or vomiting. 20 tablet 0   potassium chloride SA (KLOR-CON) 20 MEQ tablet Take 1 tablet (20 mEq total) by mouth 2 (two) times a week. 26 tablet 0   propranolol (INDERAL) 20 MG tablet TAKE 1 TABLET (20 MG TOTAL) BY MOUTH 3 (THREE) TIMES DAILY. 270 tablet 3   sertraline (ZOLOFT) 100 MG tablet Take 2 tablets (200 mg total) by mouth daily. 180 tablet 1   topiramate (TOPAMAX) 50 MG tablet Start with 50mg (1 tab) at bedtime. In 1 week increase to 100mg (2 tabs) at bedtime. 180 tablet 6   Vitamin D, Ergocalciferol, (DRISDOL) 1.25 MG (50000 UNIT) CAPS capsule Take 1 capsule (50,000 Units total) by mouth 2 (two) times a week. 12 capsule 0   No current facility-administered medications on file prior to visit.    Allergies  Allergen Reactions   Augmentin  [Amoxicillin-Pot Clavulanate] Hives     PE There were no vitals filed for this visit.  There is no height or weight on file to calculate BMI.  Physical Exam Vitals reviewed.  Constitutional:      General: She is not in acute distress.    Appearance: Normal appearance.  HENT:     Head: Normocephalic and atraumatic.     Nose: Nose normal.  Eyes:     Extraocular Movements: Extraocular movements intact.     Conjunctiva/sclera: Conjunctivae normal.  Pulmonary:     Effort: Pulmonary  effort is normal.  Chest:  Breasts:    Right: Normal.  Abdominal:     General: There is no distension.     Palpations: Abdomen is soft.     Tenderness: There is no abdominal tenderness.    Musculoskeletal:        General: Normal range of motion.     Cervical back: Normal range of motion.  Skin:    General: Skin is warm and dry.  Neurological:     General: No focal deficit present.     Mental Status: She is alert.  Psychiatric:        Mood and Affect: Mood normal.        Behavior: Behavior normal.      Assessment and Plan:        Candidal intertrigo     Rosalyn Gess, MD

## 2023-01-21 DIAGNOSIS — F4323 Adjustment disorder with mixed anxiety and depressed mood: Secondary | ICD-10-CM | POA: Diagnosis not present

## 2023-01-22 ENCOUNTER — Ambulatory Visit (INDEPENDENT_AMBULATORY_CARE_PROVIDER_SITE_OTHER): Payer: BC Managed Care – PPO | Admitting: Obstetrics and Gynecology

## 2023-01-22 ENCOUNTER — Encounter: Payer: Self-pay | Admitting: Obstetrics and Gynecology

## 2023-01-22 VITALS — BP 126/84

## 2023-01-22 DIAGNOSIS — B372 Candidiasis of skin and nail: Secondary | ICD-10-CM | POA: Diagnosis not present

## 2023-01-22 NOTE — Assessment & Plan Note (Addendum)
Extensive rash of the inguinal fold/pannus, improving Continue topical therapies as prescribed x2 additional wk, then as needed F/u PRN

## 2023-01-22 NOTE — Progress Notes (Signed)
32 y.o. G0P0000 female with sarcoidosis, morbid obesity, Mirena IUD (placed 2021 for AUB) and multiple other comorbidites here for f/u of yeast rash. Reports that rash is improving.  No LMP recorded. (Menstrual status: IUD).   OB History  Gravida Para Term Preterm AB Living  0 0 0 0 0 0  SAB IAB Ectopic Multiple Live Births  0 0 0 0 0    Past Medical History:  Diagnosis Date   Acid reflux    ADD (attention deficit disorder)    Anemia    Anxiety    Arthritis    left ankle   Back pain    Chest pain    Complication of anesthesia    Depression    Dyspnea    with exertion   Exercise-induced asthma    as a child   Fatty liver    History of blood transfusion    History of chicken pox    History of kidney stones    passed    Hypothyroidism    Joint pain    Lower extremity edema    Major depressive disorder    Migraines    chronic   Palpitations    Pneumonia    x 2   PONV (postoperative nausea and vomiting)    nausea   RLS (restless legs syndrome)    Sleep apnea 2021   Thyroid disease    hypothyroid    Past Surgical History:  Procedure Laterality Date   BRONCHIAL NEEDLE ASPIRATION BIOPSY  03/31/2020   Procedure: BRONCHIAL NEEDLE ASPIRATION BIOPSIES;  Surgeon: Josephine Igo, DO;  Location: [facility];  Service: Pulmonary;;   BRONCHIAL WASHINGS  03/31/2020   Procedure: BRONCHIAL WASHINGS;  Surgeon: Josephine Igo, DO;  Location: [facility];  Service: Pulmonary;;   DILATATION & CURETTAGE/HYSTEROSCOPY WITH MYOSURE N/A 03/22/2020   Procedure: DILATATION & CURETTAGE/HYSTEROSCOPY WITH MYOSURE;  Surgeon: Romualdo Bolk, MD;  Location: [facility];  Service: Gynecology;  Laterality: N/A;   INTERCOSTAL NERVE BLOCK Right 06/27/2020   Procedure: INTERCOSTAL NERVE BLOCK;  Surgeon: Corliss Skains, MD;  Location: [facility];  Service: Thoracic;  Laterality: Right;   INTRAUTERINE DEVICE (IUD) INSERTION N/A 03/22/2020   Procedure: INTRAUTERINE DEVICE (IUD) INSERTION;   Surgeon: Romualdo Bolk, MD;  Location: [facility] OR;  Service: Gynecology;  Laterality: N/A;  Mirena IUD insertion   KNEE ARTHROSCOPY Right 2007   LYMPH NODE BIOPSY Right 06/27/2020   Procedure: LYMPH NODE BIOPSY;  Surgeon: Corliss Skains, MD;  Location: [facility];  Service: Thoracic;  Laterality: Right;   TONSILLECTOMY  2010   TONSILLECTOMY     VIDEO BRONCHOSCOPY WITH ENDOBRONCHIAL ULTRASOUND N/A 03/31/2020   Procedure: VIDEO BRONCHOSCOPY WITH ENDOBRONCHIAL ULTRASOUND;  Surgeon: Josephine Igo, DO;  Location: [facility];  Service: Pulmonary;  Laterality: N/A;   WISDOM TOOTH EXTRACTION     WISDOM TOOTH EXTRACTION Bilateral    upper and lower    Current Outpatient Medications on File Prior to Visit  Medication Sig Dispense Refill   albuterol (VENTOLIN HFA) 108 (90 Base) MCG/ACT inhaler Inhale 2 puffs into the lungs every 6 (six) hours as needed for wheezing or shortness of breath. 8 g 2   aspirin-acetaminophen-caffeine (EXCEDRIN MIGRAINE) 250-250-65 MG tablet Take 2 tablets by mouth daily as needed for headache or migraine.     clotrimazole-betamethasone (LOTRISONE) cream Apply topically.     diphenhydrAMINE HCl, Sleep, (ZZZQUIL PO) Take by mouth at bedtime as needed.     folic acid (FOLVITE)  1 MG tablet Take 2 tablets (2 mg total) by mouth daily. 60 tablet 6   furosemide (LASIX) 20 MG tablet TAKE 1 TABLET BY MOUTH TWO TIMES A WEEK 26 tablet 5   gabapentin (NEURONTIN) 300 MG capsule Take 1 capsule (300 mg total) by mouth 2 (two) times daily. 180 capsule 0   Iron-FA-B Cmp-C-Biot-Probiotic (FUSION PLUS) CAPS One capsule twice daily with orange juice. Stop your Ferrous sulfate. 30 capsule 1   levonorgestrel (MIRENA, 52 MG,) 20 MCG/DAY IUD 1 each by Intrauterine route once.     levothyroxine (SYNTHROID) 200 MCG tablet Take 1 tablet (200 mcg total) by mouth daily. 90 tablet 0   LORazepam (ATIVAN) 0.5 MG tablet Take 1 tablet (0.5 mg total) by mouth every 8 (eight) hours as needed for anxiety.  30 tablet 1   lurasidone (LATUDA) 40 MG TABS tablet Take 1 tablet (40 mg total) by mouth daily with supper. 90 tablet 1   meloxicam (MOBIC) 7.5 MG tablet Take 1 tablet (7.5 mg total) by mouth daily. 14 tablet 0   methocarbamol (ROBAXIN) 500 MG tablet Take 1 tablet (500 mg total) by mouth every 8 (eight) hours as needed for muscle spasms. 20 tablet 0   methotrexate (RHEUMATREX) 2.5 MG tablet TAKE 6 TABLETS BY MOUTH ONCE WEEKLY PROTECT FROM LIGHT 24 tablet 5   methylphenidate (RITALIN) 10 MG tablet Take 1/2-1 tablet daily as needed 30 tablet 0   Multiple Vitamins-Minerals (MULTIVITAMIN WITH MINERALS) tablet Take 1 tablet by mouth daily.     nystatin (MYCOSTATIN/NYSTOP) powder Apply 1 Application topically 2 (two) times daily as needed. 60 g 1   omeprazole (PRILOSEC) 20 MG capsule Take 2 capsules (40 mg total) by mouth daily. 180 capsule 1   ondansetron (ZOFRAN) 4 MG tablet Take 1 tablet (4 mg total) by mouth every 8 (eight) hours as needed for nausea or vomiting. 20 tablet 0   potassium chloride SA (KLOR-CON) 20 MEQ tablet Take 1 tablet (20 mEq total) by mouth 2 (two) times a week. 26 tablet 0   propranolol (INDERAL) 20 MG tablet TAKE 1 TABLET (20 MG TOTAL) BY MOUTH 3 (THREE) TIMES DAILY. 270 tablet 3   sertraline (ZOLOFT) 100 MG tablet Take 2 tablets (200 mg total) by mouth daily. 180 tablet 1   topiramate (TOPAMAX) 50 MG tablet Start with 50mg (1 tab) at bedtime. In 1 week increase to 100mg (2 tabs) at bedtime. 180 tablet 6   Vitamin D, Ergocalciferol, (DRISDOL) 1.25 MG (50000 UNIT) CAPS capsule Take 1 capsule (50,000 Units total) by mouth 2 (two) times a week. 12 capsule 0   No current facility-administered medications on file prior to visit.    Social History   Socioeconomic History   Marital status: Single    Spouse name: Not on file   Number of children: 0   Years of education: 12   Highest education level: Not on file  Occupational History   Occupation: Conservation officer, historic buildings  Tobacco  Use   Smoking status: Never    Passive exposure: Never   Smokeless tobacco: Never  Vaping Use   Vaping status: Never Used  Substance and Sexual Activity   Alcohol use: Not Currently    Comment: rare social drink maybe once "every 5 blue moons"   Drug use: No   Sexual activity: Never    Comment: never sexually active  Other Topics Concern   Not on file  Social History Narrative   Lives alone --update 11/11/19   Seeking work  Only child    Lives in a one story house, has 2 cats      Caffeine: 2-4 cups/day   Social Determinants of Health   Financial Resource Strain: Not on file  Food Insecurity: Not on file  Transportation Needs: Not on file  Physical Activity: Not on file  Stress: Not on file  Social Connections: Not on file  Intimate Partner Violence: Not on file    Family History  Problem Relation Age of Onset   Depression Mother    Obesity Mother        died from "an infection" ? MRSA   Depression Father    Sleep apnea Father    Obesity Father    Arthritis Maternal Grandmother    Breast cancer Maternal Grandmother        in remission   Diabetes Maternal Grandfather    Arthritis Maternal Grandfather    Arthritis Paternal Grandmother    Arthritis Paternal Grandfather    Cancer Paternal Aunt        colon   Cancer Paternal Uncle        brain cancer   Migraines Neg Hx     Allergies  Allergen Reactions   Augmentin [Amoxicillin-Pot Clavulanate] Hives      PE Today's Vitals   01/22/23 1543  BP: 126/84   There is no height or weight on file to calculate BMI.  Physical Exam Vitals reviewed.  Constitutional:      General: She is not in acute distress.    Appearance: Normal appearance.  HENT:     Head: Normocephalic and atraumatic.     Nose: Nose normal.  Eyes:     Extraocular Movements: Extraocular movements intact.     Conjunctiva/sclera: Conjunctivae normal.  Pulmonary:     Effort: Pulmonary effort is normal.  Abdominal:     General: There is  no distension.     Palpations: Abdomen is soft.     Tenderness: There is no abdominal tenderness.    Musculoskeletal:        General: Normal range of motion.     Cervical back: Normal range of motion.  Skin:    General: Skin is warm and dry.  Neurological:     General: No focal deficit present.     Mental Status: She is alert.  Psychiatric:        Mood and Affect: Mood normal.        Behavior: Behavior normal.      Assessment and Plan:        Candidal intertrigo Assessment & Plan: Extensive rash of the inguinal fold/pannus, improving Continue topical therapies as prescribed x2 additional wk, then as needed F/u PRN   -clotrimazole-betamethasone (LOTRISONE) cream, Apply topically.,  -nystatin (MYCOSTATIN/NYSTOP) powder, Apply 1 Application topically 2 (two) times daily as needed.   Rosalyn Gess, MD

## 2023-01-28 DIAGNOSIS — F4323 Adjustment disorder with mixed anxiety and depressed mood: Secondary | ICD-10-CM | POA: Diagnosis not present

## 2023-02-04 DIAGNOSIS — F4323 Adjustment disorder with mixed anxiety and depressed mood: Secondary | ICD-10-CM | POA: Diagnosis not present

## 2023-02-11 DIAGNOSIS — F4323 Adjustment disorder with mixed anxiety and depressed mood: Secondary | ICD-10-CM | POA: Diagnosis not present

## 2023-02-13 ENCOUNTER — Encounter: Payer: Self-pay | Admitting: Psychiatry

## 2023-02-13 ENCOUNTER — Inpatient Hospital Stay (HOSPITAL_BASED_OUTPATIENT_CLINIC_OR_DEPARTMENT_OTHER): Payer: BC Managed Care – PPO | Admitting: Medical Oncology

## 2023-02-13 ENCOUNTER — Encounter: Payer: Self-pay | Admitting: Medical Oncology

## 2023-02-13 ENCOUNTER — Other Ambulatory Visit: Payer: Self-pay

## 2023-02-13 ENCOUNTER — Inpatient Hospital Stay: Payer: BC Managed Care – PPO | Attending: Hematology & Oncology

## 2023-02-13 VITALS — BP 123/69 | HR 69 | Temp 99.5°F | Resp 19 | Ht 67.0 in | Wt 371.0 lb

## 2023-02-13 DIAGNOSIS — N920 Excessive and frequent menstruation with regular cycle: Secondary | ICD-10-CM | POA: Insufficient documentation

## 2023-02-13 DIAGNOSIS — Z79899 Other long term (current) drug therapy: Secondary | ICD-10-CM | POA: Diagnosis not present

## 2023-02-13 DIAGNOSIS — D869 Sarcoidosis, unspecified: Secondary | ICD-10-CM | POA: Diagnosis not present

## 2023-02-13 DIAGNOSIS — D500 Iron deficiency anemia secondary to blood loss (chronic): Secondary | ICD-10-CM | POA: Diagnosis not present

## 2023-02-13 LAB — IRON AND IRON BINDING CAPACITY (CC-WL,HP ONLY)
Iron: 102 ug/dL (ref 28–170)
Saturation Ratios: 25 % (ref 10.4–31.8)
TIBC: 407 ug/dL (ref 250–450)
UIBC: 305 ug/dL (ref 148–442)

## 2023-02-13 LAB — CBC WITH DIFFERENTIAL (CANCER CENTER ONLY)
Abs Immature Granulocytes: 0.07 10*3/uL (ref 0.00–0.07)
Basophils Absolute: 0 10*3/uL (ref 0.0–0.1)
Basophils Relative: 0 %
Eosinophils Absolute: 0.2 10*3/uL (ref 0.0–0.5)
Eosinophils Relative: 2 %
HCT: 44.1 % (ref 36.0–46.0)
Hemoglobin: 14.6 g/dL (ref 12.0–15.0)
Immature Granulocytes: 1 %
Lymphocytes Relative: 20 %
Lymphs Abs: 1.9 10*3/uL (ref 0.7–4.0)
MCH: 30.1 pg (ref 26.0–34.0)
MCHC: 33.1 g/dL (ref 30.0–36.0)
MCV: 90.9 fL (ref 80.0–100.0)
Monocytes Absolute: 0.4 10*3/uL (ref 0.1–1.0)
Monocytes Relative: 4 %
Neutro Abs: 7.1 10*3/uL (ref 1.7–7.7)
Neutrophils Relative %: 73 %
Platelet Count: 133 10*3/uL — ABNORMAL LOW (ref 150–400)
RBC: 4.85 MIL/uL (ref 3.87–5.11)
RDW: 14.6 % (ref 11.5–15.5)
Smear Review: NORMAL
WBC Count: 9.6 10*3/uL (ref 4.0–10.5)
nRBC: 0 % (ref 0.0–0.2)

## 2023-02-13 LAB — RETICULOCYTES
Immature Retic Fract: 18.1 % — ABNORMAL HIGH (ref 2.3–15.9)
RBC.: 4.82 MIL/uL (ref 3.87–5.11)
Retic Count, Absolute: 101.7 10*3/uL (ref 19.0–186.0)
Retic Ct Pct: 2.1 % (ref 0.4–3.1)

## 2023-02-13 LAB — FERRITIN: Ferritin: 137 ng/mL (ref 11–307)

## 2023-02-13 NOTE — Progress Notes (Signed)
Hematology and Oncology Follow Up Visit  Margaret Golden [id number] 1991/03/04 31 y.o. 02/13/2023   Principle Diagnosis:  Iron deficiency anemia secondary to heavy irregular cycles   Current Therapy:        IV iron as indicated - Venofer- last dose was on 05/10/2020   Interim History:  Margaret Golden is here today for annual follow-up.   Today she states that she is doing well. She has no concerns at this time.  She has an IUD- menstrual cycles are light and now irregular  No other blood loss noted. No bruising or petechiae.  No fever, chills, n/v, cough, rash, dizziness, SOB, chest pain, palpitations, abdominal pain or changes in bowel or bladder habits.  Swelling in her extremities is unchanged from baseline.  Numbness and tingling comes and goes in her upper extremities.  No falls or syncope reported.  Appetite and hydration are good.  ECOG Performance Status: 1 - Symptomatic but completely ambulatory  Medications:  Allergies as of 02/13/2023       Reactions   Augmentin [amoxicillin-pot Clavulanate] Hives        Medication List        Accurate as of February 13, 2023  1:44 PM. If you have any questions, ask your nurse or doctor.          albuterol 108 (90 Base) MCG/ACT inhaler Commonly known as: VENTOLIN HFA Inhale 2 puffs into the lungs every 6 (six) hours as needed for wheezing or shortness of breath.   aspirin-acetaminophen-caffeine 250-250-65 MG tablet Commonly known as: EXCEDRIN MIGRAINE Take 2 tablets by mouth daily as needed for headache or migraine.   clotrimazole-betamethasone cream Commonly known as: LOTRISONE Apply topically.   folic acid 1 MG tablet Commonly known as: FOLVITE Take 2 tablets (2 mg total) by mouth daily.   furosemide 20 MG tablet Commonly known as: LASIX TAKE 1 TABLET BY MOUTH TWO TIMES A WEEK   Fusion Plus Caps One capsule twice daily with orange juice. Stop your Ferrous sulfate.   gabapentin 300 MG capsule Commonly  known as: Neurontin Take 1 capsule (300 mg total) by mouth 2 (two) times daily.   levothyroxine 200 MCG tablet Commonly known as: SYNTHROID Take 1 tablet (200 mcg total) by mouth daily.   LORazepam 0.5 MG tablet Commonly known as: Ativan Take 1 tablet (0.5 mg total) by mouth every 8 (eight) hours as needed for anxiety.   lurasidone 40 MG Tabs tablet Commonly known as: Latuda Take 1 tablet (40 mg total) by mouth daily with supper.   meloxicam 7.5 MG tablet Commonly known as: MOBIC Take 1 tablet (7.5 mg total) by mouth daily.   methocarbamol 500 MG tablet Commonly known as: Robaxin Take 1 tablet (500 mg total) by mouth every 8 (eight) hours as needed for muscle spasms.   methotrexate 2.5 MG tablet Commonly known as: RHEUMATREX TAKE 6 TABLETS BY MOUTH ONCE WEEKLY PROTECT FROM LIGHT   methylphenidate 10 MG tablet Commonly known as: RITALIN Take 1/2-1 tablet daily as needed   Mirena (52 MG) 20 MCG/DAY Iud Generic drug: levonorgestrel 1 each by Intrauterine route once.   multivitamin with minerals tablet Take 1 tablet by mouth daily.   nystatin powder Commonly known as: MYCOSTATIN/NYSTOP Apply 1 Application topically 2 (two) times daily as needed.   omeprazole 20 MG capsule Commonly known as: PRILOSEC Take 2 capsules (40 mg total) by mouth daily.   ondansetron 4 MG tablet Commonly known as: Zofran Take 1 tablet (4 mg total)  by mouth every 8 (eight) hours as needed for nausea or vomiting.   potassium chloride SA 20 MEQ tablet Commonly known as: KLOR-CON M Take 1 tablet (20 mEq total) by mouth 2 (two) times a week.   propranolol 20 MG tablet Commonly known as: INDERAL TAKE 1 TABLET (20 MG TOTAL) BY MOUTH 3 (THREE) TIMES DAILY.   sertraline 100 MG tablet Commonly known as: ZOLOFT Take 2 tablets (200 mg total) by mouth daily.   topiramate 50 MG tablet Commonly known as: Topamax Start with 50mg (1 tab) at bedtime. In 1 week increase to 100mg (2 tabs) at bedtime.    Vitamin D (Ergocalciferol) 1.25 MG (50000 UNIT) Caps capsule Commonly known as: DRISDOL Take 1 capsule (50,000 Units total) by mouth 2 (two) times a week.   ZZZQUIL PO Take by mouth at bedtime as needed.        Allergies:  Allergies  Allergen Reactions   Augmentin [Amoxicillin-Pot Clavulanate] Hives    Past Medical History, Surgical history, Social history, and Family History were reviewed and updated.  Review of Systems: All other 10 point review of systems is negative.   Physical Exam:  height is 5\' 7"  (1.702 m) and weight is 371 lb 0.6 oz (168.3 kg) (abnormal). Her oral temperature is 99.5 F (37.5 C). Her blood pressure is 123/69 and her pulse is 69. Her respiration is 19 and oxygen saturation is 98%.   Wt Readings from Last 3 Encounters:  02/13/23 (!) 371 lb 0.6 oz (168.3 kg)  01/01/23 (!) 374 lb 6.4 oz (169.8 kg)  11/27/22 (!) 382 lb (173.3 kg)    Ocular: Sclerae unicteric, pupils equal, round and reactive to light Ear-nose-throat: Oropharynx clear, dentition fair Lymphatic: No cervical or supraclavicular adenopathy Lungs no rales or rhonchi, good excursion bilaterally Heart regular rate and rhythm, no murmur appreciated Abd soft, nontender, positive bowel sounds MSK no focal spinal tenderness, no joint edema Neuro: non-focal, well-oriented, appropriate affect  Lab Results  Component Value Date   WBC 9.6 02/13/2023   HGB 14.6 02/13/2023   HCT 44.1 02/13/2023   MCV 90.9 02/13/2023   PLT 133 (L) 02/13/2023   Lab Results  Component Value Date   FERRITIN 158 02/14/2022   IRON 196 (H) 02/14/2022   TIBC 337 02/14/2022   UIBC 141 (L) 02/14/2022   IRONPCTSAT 58 (H) 02/14/2022   Lab Results  Component Value Date   RETICCTPCT 2.1 02/13/2023   RBC 4.82 02/13/2023   RBC 4.85 02/13/2023   No results found for: "KPAFRELGTCHN", "LAMBDASER", "KAPLAMBRATIO" No results found for: "IGGSERUM", "IGA", "IGMSERUM" No results found for: "TOTALPROTELP", "ALBUMINELP",  "A1GS", "A2GS", "BETS", "BETA2SER", "GAMS", "MSPIKE", "SPEI"   Chemistry      Component Value Date/Time   NA 140 01/08/2023 1305   K 3.7 01/08/2023 1305   CL 103 01/08/2023 1305   CO2 27 01/08/2023 1305   BUN 11 01/08/2023 1305   CREATININE 0.73 01/08/2023 1305   CREATININE 0.74 09/30/2019 1047   CREATININE 0.75 07/06/2016 1534      Component Value Date/Time   CALCIUM 8.9 01/08/2023 1305   ALKPHOS 100 01/08/2023 1305   AST 46 (H) 01/08/2023 1305   AST 27 09/30/2019 1047   ALT 27 01/08/2023 1305   ALT 14 09/30/2019 1047   BILITOT 0.5 01/08/2023 1305   BILITOT 0.4 09/30/2019 1047      Encounter Diagnoses  Name Primary?   Iron deficiency anemia due to chronic blood loss Yes   Menorrhagia with regular cycle  Impression and Plan: Margaret Golden is a very pleasant 32 yo caucasian female with long history of iron deficiency anemia secondary to history of heavy irregular cycles. Now has an IUD which has helped lighten menses. She also has sarcoidosis and is on treatment with Methotrexate.   CBC shows a Hgb of 14.6. MCV of 90.9. Platelets are down slightly at 133 Iron studies are pending. We will replace if needed.   RTC 1 year APP, labs (CBC, iron, ferritin)  Rushie Chestnut, PA-C 11/13/20241:44 PM

## 2023-02-14 ENCOUNTER — Other Ambulatory Visit: Payer: Self-pay | Admitting: Primary Care

## 2023-02-18 DIAGNOSIS — F4323 Adjustment disorder with mixed anxiety and depressed mood: Secondary | ICD-10-CM | POA: Diagnosis not present

## 2023-02-25 DIAGNOSIS — F4323 Adjustment disorder with mixed anxiety and depressed mood: Secondary | ICD-10-CM | POA: Diagnosis not present

## 2023-02-26 ENCOUNTER — Other Ambulatory Visit (INDEPENDENT_AMBULATORY_CARE_PROVIDER_SITE_OTHER): Payer: BC Managed Care – PPO

## 2023-02-26 DIAGNOSIS — E039 Hypothyroidism, unspecified: Secondary | ICD-10-CM | POA: Diagnosis not present

## 2023-02-27 LAB — TSH: TSH: 4.86 u[IU]/mL (ref 0.35–5.50)

## 2023-03-01 ENCOUNTER — Other Ambulatory Visit: Payer: Self-pay | Admitting: Family

## 2023-03-04 DIAGNOSIS — F4323 Adjustment disorder with mixed anxiety and depressed mood: Secondary | ICD-10-CM | POA: Diagnosis not present

## 2023-03-05 ENCOUNTER — Encounter: Payer: Self-pay | Admitting: Psychiatry

## 2023-03-05 ENCOUNTER — Ambulatory Visit (INDEPENDENT_AMBULATORY_CARE_PROVIDER_SITE_OTHER): Payer: BC Managed Care – PPO | Admitting: Psychiatry

## 2023-03-05 DIAGNOSIS — F900 Attention-deficit hyperactivity disorder, predominantly inattentive type: Secondary | ICD-10-CM

## 2023-03-05 DIAGNOSIS — F339 Major depressive disorder, recurrent, unspecified: Secondary | ICD-10-CM | POA: Diagnosis not present

## 2023-03-05 DIAGNOSIS — F4010 Social phobia, unspecified: Secondary | ICD-10-CM

## 2023-03-05 DIAGNOSIS — G2581 Restless legs syndrome: Secondary | ICD-10-CM

## 2023-03-05 MED ORDER — METHYLPHENIDATE HCL 10 MG PO TABS: ORAL_TABLET | ORAL | 0 refills | Status: AC

## 2023-03-05 MED ORDER — LURASIDONE HCL 40 MG PO TABS: 40.0000 mg | ORAL_TABLET | Freq: Every day | ORAL | 1 refills | Status: AC

## 2023-03-05 MED ORDER — LORAZEPAM 0.5 MG PO TABS: 0.5000 mg | ORAL_TABLET | Freq: Three times a day (TID) | ORAL | 4 refills | Status: AC | PRN

## 2023-03-05 MED ORDER — GABAPENTIN 300 MG PO CAPS
300.0000 mg | ORAL_CAPSULE | Freq: Two times a day (BID) | ORAL | 1 refills | Status: AC
Start: 2023-03-25 — End: 2023-11-08

## 2023-03-05 MED ORDER — SERTRALINE HCL 100 MG PO TABS: 200.0000 mg | ORAL_TABLET | Freq: Every day | ORAL | 1 refills | Status: AC

## 2023-03-05 NOTE — Progress Notes (Signed)
Margaret Golden [id number] 1990/12/10 32 y.o.  Subjective:   Patient ID:  Margaret Golden is a 32 y.o. (DOB 1990-07-05) female.  Chief Complaint:  Chief Complaint  Patient presents with   Follow-up    Depression, anxiety, and insomnia    HPI GIAH CHUE presents to the office today for follow-up of anxiety, depression, and insomnia. She reports, "Ups and downs because of the time of the year." She reports that this November went better than previous years because she spent Thanksgiving and more time with friends. She anticipates the end of December will be difficult. Mood has been depressed. Anxiety has been "ok." Reports some generalized anxiety. Denies any recent panic. She reports disrupted sleep with time of the year and her cats keeping her from falling asleep at times. Energy and motivation have been lower due to seasonal depression. Appetite varies. Reports, "overall I have a pretty steady appetite." Difficulty with concentration at times. Reports starting tasks and then going to another task before completing the first task. Asks a friend to help clean to stay on task. Denies SI.   Father started a new job and may be visiting around the holidays.   Kitchen flooded and unable to use kitchen sink right now. Waiting for [employer] Team to come.   Reports taking medications consistently.   Reports increase in Gabapentin has been helpful for RLS.   Has not been taking Lorazepam prn very often.   Continues to see therapist.   Gabapentin last filled 12/26/22. Ritalin last filled 12/25/22.  Lorazepam last filled 09/25/22.   Past medication trials: Wellbutrin- initially helpful and then was less helpful Lexapro-ineffective Cymbalta Pristiq Seroquel- does not recall any significant improvement Latuda Abilify Propranolol- prescribed for HR. Not helpful for anxiety Melatonin Vyvanse Adderall XR- not effective Concerta- Notices duration only lasts about 4 hours. No  significant improvement with doses less than 54 mg.  [name]- May have caused n/v and poor concentration Horizant Lamictal-effective for mood Trazodone-effective for insomnia but causes some excessive drowsiness Rexulti- May have caused n/v and poor concentration Ativan- helpful Armodafinil- jaw locked    AIMS    Flowsheet Row Office Visit from 03/05/2023 in [facility] Office Visit from 05/25/2022 in [facility] Office Visit from 09/28/2021 in [facility]  AIMS Total Score 0 4 6      GAD-7    Flowsheet Row Office Visit from 06/20/2022 in [facility] Office Visit from 09/25/2021 in [facility] at [facility] Office Visit from 11/16/2020 in [facility] at [facility]  Total GAD-7 Score 14 17 19       PHQ2-9    Flowsheet Row Office Visit from 11/26/2022 in [facility] at [facility] Office Visit from 06/20/2022 in [facility] at [facility] Office Visit from 09/25/2021 in [facility] at [facility] Office Visit from 11/16/2020 in [facility] Primary Care at [facility] Office Visit from 09/03/2019 in [facility] Total Score 1 4 6 6 3   PHQ-9 Total Score 5 19 20 20 14       Flowsheet Row ED from 09/20/2021 in [facility] Emergency Department at [facility] Admission (Discharged) from 06/27/2020 in [facility] 4E CV SURGICAL PROGRESSIVE CARE Pre-Admission Testing 60 from 06/23/2020 in  [facility] PREADMISSION TESTING  C-SSRS RISK CATEGORY No Risk No Risk No Risk        Review of Systems:  Review of Systems  HENT:  Positive for congestion.   Respiratory:  Positive for shortness of breath.   Musculoskeletal:  Negative for gait problem.   Neurological:  Negative for tremors.  Psychiatric/Behavioral:         Please refer to HPI    Medications: I have reviewed the patient's current medications.  Current Outpatient Medications  Medication Sig Dispense Refill   albuterol (VENTOLIN HFA) 108 (90 Base) MCG/ACT inhaler Inhale 2 puffs into the lungs every 6 (six) hours as needed for wheezing or shortness of breath. 8 g 2   aspirin-acetaminophen-caffeine (EXCEDRIN MIGRAINE) 250-250-65 MG tablet Take 2 tablets by mouth daily as needed for headache or migraine.     diphenhydrAMINE HCl, Sleep, (ZZZQUIL PO) Take by mouth at bedtime as needed.     folic acid (FOLVITE) 1 MG tablet Take 2 tablets (2 mg total) by mouth daily. 60 tablet 6   furosemide (LASIX) 20 MG tablet TAKE 1 TABLET BY MOUTH TWO TIMES A WEEK 26 tablet 5   levonorgestrel (MIRENA, 52 MG,) 20 MCG/DAY IUD 1 each by Intrauterine route once.     levothyroxine (SYNTHROID) 200 MCG tablet Take 1 tablet (200 mcg total) by mouth daily. 90 tablet 0   meloxicam (MOBIC) 7.5 MG tablet Take 1 tablet (7.5 mg total) by mouth daily. 14 tablet 0   methotrexate (RHEUMATREX) 2.5 MG tablet TAKE 6 TABLETS BY MOUTH ONCE A WEEK (PROTECT MEDICINE FROM LIGHT) 24 tablet 5   Multiple Vitamins-Minerals (MULTIVITAMIN WITH MINERALS) tablet Take 1 tablet by mouth daily.     nystatin (MYCOSTATIN/NYSTOP) powder Apply 1 Application topically 2 (two) times daily as needed. 60 g 1   omeprazole (PRILOSEC) 20 MG capsule Take 2 capsules (40 mg total) by mouth daily. 180 capsule 1   ondansetron (ZOFRAN) 4 MG tablet Take 1 tablet (4 mg total) by mouth every 8 (eight) hours as needed for nausea or vomiting. 20 tablet 0   potassium chloride SA (KLOR-CON) 20 MEQ tablet Take 1 tablet (20 mEq total) by mouth 2 (two) times a week. 26 tablet 0   propranolol (INDERAL) 20 MG tablet TAKE 1 TABLET (20 MG TOTAL) BY MOUTH 3 (THREE) TIMES DAILY. 270 tablet 3   topiramate (TOPAMAX) 50 MG tablet Start with 50mg (1 tab) at bedtime. In  1 week increase to 100mg (2 tabs) at bedtime. 180 tablet 6   Vitamin D, Ergocalciferol, (DRISDOL) 1.25 MG (50000 UNIT) CAPS capsule Take 1 capsule (50,000 Units total) by mouth 2 (two) times a week. 12 capsule 0   clotrimazole-betamethasone (LOTRISONE) cream Apply topically. (Patient not taking: Reported on 03/05/2023)     [START ON 03/25/2023] gabapentin (NEURONTIN) 300 MG capsule Take 1 capsule (300 mg total) by mouth 2 (two) times daily. 180 capsule 1   Iron-FA-B Cmp-C-Biot-Probiotic (FUSION PLUS) CAPS One capsule twice daily with orange juice. Stop your Ferrous sulfate. 30 capsule 1   [START ON 04/02/2023] LORazepam (ATIVAN) 0.5 MG tablet Take 1 tablet (0.5 mg total) by mouth every 8 (eight) hours as needed for anxiety. 30 tablet 4   lurasidone (LATUDA) 40 MG TABS tablet Take 1 tablet (40 mg total) by mouth daily with supper. 90 tablet 1   methocarbamol (ROBAXIN) 500 MG tablet Take 1 tablet (500 mg total) by mouth every 8 (eight) hours as needed for muscle spasms. 20 tablet 0  methylphenidate (RITALIN) 10 MG tablet Take 1/2-1 tablet daily as needed 30 tablet 0   sertraline (ZOLOFT) 100 MG tablet Take 2 tablets (200 mg total) by mouth daily. 180 tablet 1   No current facility-administered medications for this visit.    Medication Side Effects: None  Allergies:  Allergies  Allergen Reactions   Augmentin [Amoxicillin-Pot Clavulanate] Hives    Past Medical History:  Diagnosis Date   Acid reflux    ADD (attention deficit disorder)    Anemia    Anxiety    Arthritis    left ankle   Back pain    Chest pain    Complication of anesthesia    Depression    Dyspnea    with exertion   Exercise-induced asthma    as a child   Fatty liver    History of blood transfusion    History of chicken pox    History of kidney stones    passed    Hypothyroidism    Joint pain    Lower extremity edema    Major depressive disorder    Migraines    chronic   Palpitations    Pneumonia    x 2    PONV (postoperative nausea and vomiting)    nausea   RLS (restless legs syndrome)    Sleep apnea 2021   Thyroid disease    hypothyroid    Past Medical History, Surgical history, Social history, and Family history were reviewed and updated as appropriate.   Please see review of systems for further details on the patient's review from today.   Objective:   Physical Exam:  There were no vitals taken for this visit.  Physical Exam Constitutional:      General: She is not in acute distress. Musculoskeletal:        General: No deformity.  Neurological:     Mental Status: She is alert and oriented to person, place, and time.     Coordination: Coordination normal.  Psychiatric:        Attention and Perception: Attention and perception normal. She does not perceive auditory or visual hallucinations.        Mood and Affect: Mood is not anxious. Affect is not labile, blunt, angry or inappropriate.        Speech: Speech normal.        Behavior: Behavior normal.        Thought Content: Thought content normal. Thought content is not paranoid or delusional. Thought content does not include homicidal or suicidal ideation. Thought content does not include homicidal or suicidal plan.        Cognition and Memory: Cognition and memory normal.        Judgment: Judgment normal.     Comments: Insight intact Mood is mildly depressed     Lab Review:     Component Value Date/Time   NA 140 01/08/2023 1305   K 3.7 01/08/2023 1305   CL 103 01/08/2023 1305   CO2 27 01/08/2023 1305   GLUCOSE 123 (H) 01/08/2023 1305   BUN 11 01/08/2023 1305   CREATININE 0.73 01/08/2023 1305   CREATININE 0.74 09/30/2019 1047   CREATININE 0.75 07/06/2016 1534   CALCIUM 8.9 01/08/2023 1305   PROT 6.2 01/08/2023 1305   ALBUMIN 3.9 01/08/2023 1305   AST 46 (H) 01/08/2023 1305   AST 27 09/30/2019 1047   ALT 27 01/08/2023 1305   ALT 14 09/30/2019 1047   ALKPHOS 100 01/08/2023 1305   BILITOT  0.5 01/08/2023 1305    BILITOT 0.4 09/30/2019 1047   GFRNONAA >60 06/28/2020 0703   GFRNONAA >60 09/30/2019 1047   GFRAA >60 09/30/2019 1047       Component Value Date/Time   WBC 9.6 02/13/2023 1241   WBC 7.9 07/31/2022 1501   RBC 4.82 02/13/2023 1241   RBC 4.85 02/13/2023 1241   HGB 14.6 02/13/2023 1241   HGB 9.3 (L) 08/26/2019 1045   HCT 44.1 02/13/2023 1241   HCT 33.4 (L) 08/26/2019 1045   PLT 133 (L) 02/13/2023 1241   PLT 234 08/26/2019 1045   MCV 90.9 02/13/2023 1241   MCV 72 (L) 08/26/2019 1045   MCH 30.1 02/13/2023 1241   MCHC 33.1 02/13/2023 1241   RDW 14.6 02/13/2023 1241   RDW 15.4 08/26/2019 1045   LYMPHSABS 1.9 02/13/2023 1241   MONOABS 0.4 02/13/2023 1241   EOSABS 0.2 02/13/2023 1241   BASOSABS 0.0 02/13/2023 1241    No results found for: "POCLITH", "LITHIUM"   No results found for: "PHENYTOIN", "PHENOBARB", "VALPROATE", "CBMZ"   .res Assessment: Plan:    32 minutes spent dedicated to the care of this patient on the date of this encounter to include pre-visit review of records, ordering of medication, post visit documentation, and face-to-face time with the patient discussing transition of care with provider leaving the practice. Pt reports that she would like to continue care with this provider. She reports that she would like to continue current medications without changes at this time.  Continue Gabapentin 300 mg twice daily for anxiety.  Continue Sertraline 200 mg daily for depression and anxiety.  Continue Latuda 40 mg daily with supper for mood symptoms.  Continue Lorazepam 0.5 mg as needed for anxiety.  Continue Methylphenidate 10 mg 1/2-1 tablet daily as needed.  Recommend continuing psychotherapy.  Patient advised to contact office with any questions, adverse effects, or acute worsening in signs and symptoms.  Susann was seen today for follow-up.  Diagnoses and all orders for this visit:  Social phobia -     gabapentin (NEURONTIN) 300 MG capsule; Take 1 capsule (300  mg total) by mouth 2 (two) times daily. -     LORazepam (ATIVAN) 0.5 MG tablet; Take 1 tablet (0.5 mg total) by mouth every 8 (eight) hours as needed for anxiety. -     sertraline (ZOLOFT) 100 MG tablet; Take 2 tablets (200 mg total) by mouth daily.  RLS (restless legs syndrome) -     gabapentin (NEURONTIN) 300 MG capsule; Take 1 capsule (300 mg total) by mouth 2 (two) times daily.  Major depression, recurrent, chronic (HCC) -     lurasidone (LATUDA) 40 MG TABS tablet; Take 1 tablet (40 mg total) by mouth daily with supper. -     sertraline (ZOLOFT) 100 MG tablet; Take 2 tablets (200 mg total) by mouth daily.  Attention deficit hyperactivity disorder (ADHD), predominantly inattentive type -     methylphenidate (RITALIN) 10 MG tablet; Take 1/2-1 tablet daily as needed     Please see After Visit Summary for patient specific instructions.  Future Appointments  Date Time Provider Department Center  05/31/2023  1:20 PM [name], NP [facility]  02/13/2024  1:45 PM [facility] None  02/13/2024  2:00 PM Covington, Sarah M, PA-C [facility] None    No orders of the defined types were placed in this encounter.   -------------------------------

## 2023-03-11 DIAGNOSIS — F4323 Adjustment disorder with mixed anxiety and depressed mood: Secondary | ICD-10-CM | POA: Diagnosis not present

## 2023-03-18 DIAGNOSIS — F4323 Adjustment disorder with mixed anxiety and depressed mood: Secondary | ICD-10-CM | POA: Diagnosis not present

## 2023-03-25 DIAGNOSIS — F4323 Adjustment disorder with mixed anxiety and depressed mood: Secondary | ICD-10-CM | POA: Diagnosis not present

## 2023-04-01 DIAGNOSIS — F4323 Adjustment disorder with mixed anxiety and depressed mood: Secondary | ICD-10-CM | POA: Diagnosis not present

## 2023-04-04 ENCOUNTER — Other Ambulatory Visit: Payer: Self-pay | Admitting: Pulmonary Disease

## 2023-04-04 DIAGNOSIS — D869 Sarcoidosis, unspecified: Secondary | ICD-10-CM

## 2023-04-08 DIAGNOSIS — F4323 Adjustment disorder with mixed anxiety and depressed mood: Secondary | ICD-10-CM | POA: Diagnosis not present

## 2023-04-15 DIAGNOSIS — F4323 Adjustment disorder with mixed anxiety and depressed mood: Secondary | ICD-10-CM | POA: Diagnosis not present

## 2023-04-17 ENCOUNTER — Other Ambulatory Visit: Payer: Self-pay | Admitting: Family

## 2023-04-17 DIAGNOSIS — E039 Hypothyroidism, unspecified: Secondary | ICD-10-CM

## 2023-04-22 DIAGNOSIS — F4323 Adjustment disorder with mixed anxiety and depressed mood: Secondary | ICD-10-CM | POA: Diagnosis not present

## 2023-05-06 DIAGNOSIS — F4323 Adjustment disorder with mixed anxiety and depressed mood: Secondary | ICD-10-CM | POA: Diagnosis not present

## 2023-05-09 ENCOUNTER — Ambulatory Visit: Payer: BC Managed Care – PPO | Admitting: Pulmonary Disease

## 2023-05-09 ENCOUNTER — Encounter: Payer: Self-pay | Admitting: Pulmonary Disease

## 2023-05-09 VITALS — BP 118/74 | HR 96 | Ht 67.0 in | Wt 380.0 lb

## 2023-05-09 DIAGNOSIS — Z5181 Encounter for therapeutic drug level monitoring: Secondary | ICD-10-CM

## 2023-05-09 DIAGNOSIS — D869 Sarcoidosis, unspecified: Secondary | ICD-10-CM | POA: Diagnosis not present

## 2023-05-09 NOTE — Patient Instructions (Addendum)
 We will repeat a CT Chest scan to monitor your sarcoidosis findings  Continue methotrexate  15mg  weekly  Continue 2mg  folic acid  daily  We will check labs today  Follow up in 3 months

## 2023-05-09 NOTE — Progress Notes (Signed)
 Synopsis: Referred in 12/2019 by Eleanor Ponto, NP for mediastinal adenopathy  Subjective:   PATIENT ID: Margaret Golden GENDER: female DOB: [birth year], MRN: [id number]  HPI  Chief Complaint  Patient presents with   Follow-up   [name] is a 33 year old woman, non-smoker with history of anemia, hypertension and obesity who returns to pulmonary clinic for follow up of sarcoidosis.    She has been doing well since last visit. No major changes in her dyspnea and chest discomfort with exertion. She has noted reduced side effects after taking methotrexate  with taking 2mg  of folic acid  daily.    OV 08/31/22 She was seen by Landry Ferrari 10/11/21 and 07/31/22 for follow up. She was ordered for split night sleep study at last visit. She has been resumed on methotrexate  after missing her doses for 4-5 weeks. She continues to have sluggish feeling and brain fog after taking her weekly doses. Otherwise she is doing ok.  OV 05/2021 She continues on 15mg  methotrexate  weekly since last fall. She denies any improvement in her chest pains or dyspnea. No updated lab work since 01/2021. She has gained 14lbs since last visit. She complains of feeling sluggish and having brain fog for 2-3 days after taking her weekly dose of methotrexate .   She is not using CPAP.   OV 08/2020 She was started on methotrexate  5mg  weekly at last visit. She has tolerated the medication well thus far. She does not notice any change in her dyspnea on exertion. She continues to have intermittent chest pains.   Pulmonary function tests are normal overall but they show increased diffusion capacity.   OV 07/26/20 She developed chest discomfort and shortness of breath about 1 year ago. She was hospitalized for symptomatic anemia due to heavy menses. Her hemoglobin has remained in the normal range since but she continued to have the above symptoms. She was evaluated by Cardiology prior to hysteroscopy who performed an ECHO which  was normal and a CTA coronary scan which showed mediastinal adenopathy and pulmonary nodules.   We have followed her thoracic adenopathy with serial CT chest scans which demonstrated progression of the adenopathy prompting a PET scan on 04/27/20 which demonstrated increased uptake in her lymph nodes and also the right upper lobe nodule. She was taken for EBUS on 03/31/20 with sampling of the station 7 lymph node with non-diagnostic results. She was then referred to Thoracic Surgery who performed mediastinoscopy on 06/27/20 with lymph node samples  with non-caseating granulomas consistent with sarcoidosis.     She denies any vision changes, joint pains or skin rashes. She continues to have exertional dyspnea, chest pressure and has started to wake up short of breath at night at times.   No family members with sarcoidosis.   Past Medical History:  Diagnosis Date   Acid reflux    ADD (attention deficit disorder)    Anemia    Anxiety    Arthritis    left ankle   Back pain    Chest pain    Complication of anesthesia    Depression    Dyspnea    with exertion   Exercise-induced asthma    as a child   Fatty liver    History of blood transfusion    History of chicken pox    History of kidney stones    passed    Hypothyroidism    Joint pain    Lower extremity edema    Major depressive disorder  Migraines    chronic   Palpitations    Pneumonia    x 2   PONV (postoperative nausea and vomiting)    nausea   RLS (restless legs syndrome)    Sleep apnea 2021   Thyroid  disease    hypothyroid     Family History  Problem Relation Age of Onset   Depression Mother    Obesity Mother        died from an infection ? MRSA   Depression Father    Sleep apnea Father    Obesity Father    Arthritis Maternal Grandmother    Breast cancer Maternal Grandmother        in remission   Diabetes Maternal Grandfather    Arthritis Maternal Grandfather    Arthritis Paternal Grandmother     Arthritis Paternal Grandfather    Cancer Paternal Aunt        colon   Cancer Paternal Uncle        brain cancer   Migraines Neg Hx      Social History   Socioeconomic History   Marital status: Single    Spouse name: Not on file   Number of children: 0   Years of education: 12   Highest education level: Not on file  Occupational History   Occupation: Conservation officer, historic buildings  Tobacco Use   Smoking status: Never    Passive exposure: Never   Smokeless tobacco: Never  Vaping Use   Vaping status: Never Used  Substance and Sexual Activity   Alcohol use: Not Currently    Comment: rare social drink maybe once every 5 blue moons   Drug use: No   Sexual activity: Never    Comment: never sexually active  Other Topics Concern   Not on file  Social History Narrative   Lives alone --update 11/11/19   Seeking work   Only child    Lives in a one story house, has 2 cats      Caffeine : 2-4 cups/day   Social Drivers of Corporate Investment Banker Strain: Not on file  Food Insecurity: Not on file  Transportation Needs: Not on file  Physical Activity: Not on file  Stress: Not on file  Social Connections: Not on file  Intimate Partner Violence: Not on file     Allergies  Allergen Reactions   Augmentin [Amoxicillin -Pot Clavulanate] Hives     Outpatient Medications Prior to Visit  Medication Sig Dispense Refill   albuterol  (VENTOLIN  HFA) 108 (90 Base) MCG/ACT inhaler Inhale 2 puffs into the lungs every 6 (six) hours as needed for wheezing or shortness of breath. 8 g 2   aspirin -acetaminophen -caffeine  (EXCEDRIN  MIGRAINE) 250-250-65 MG tablet Take 2 tablets by mouth daily as needed for headache or migraine.     clotrimazole -betamethasone  (LOTRISONE ) cream Apply topically.     diphenhydrAMINE  HCl, Sleep, (ZZZQUIL PO) Take by mouth at bedtime as needed.     folic acid  (FOLVITE ) 1 MG tablet TAKE 2 TABLETS BY MOUTH DAILY 60 tablet 3   furosemide  (LASIX ) 20 MG tablet TAKE 1 TABLET BY  MOUTH TWO TIMES A WEEK 26 tablet 5   gabapentin  (NEURONTIN ) 300 MG capsule Take 1 capsule (300 mg total) by mouth 2 (two) times daily. 180 capsule 1   Iron -FA-B Cmp-C-Biot-Probiotic (FUSION PLUS) CAPS One capsule twice daily with orange juice. Stop your Ferrous sulfate . 30 capsule 1   levonorgestrel  (MIRENA , 52 MG,) 20 MCG/DAY IUD 1 each by Intrauterine route once.     levothyroxine  (  SYNTHROID ) 200 MCG tablet TAKE 1 TABLET BY MOUTH DAILY 90 tablet 0   LORazepam  (ATIVAN ) 0.5 MG tablet Take 1 tablet (0.5 mg total) by mouth every 8 (eight) hours as needed for anxiety. 30 tablet 4   lurasidone  (LATUDA ) 40 MG TABS tablet Take 1 tablet (40 mg total) by mouth daily with supper. 90 tablet 1   meloxicam  (MOBIC ) 7.5 MG tablet Take 1 tablet (7.5 mg total) by mouth daily. 14 tablet 0   methocarbamol  (ROBAXIN ) 500 MG tablet Take 1 tablet (500 mg total) by mouth every 8 (eight) hours as needed for muscle spasms. 20 tablet 0   methotrexate  (RHEUMATREX) 2.5 MG tablet TAKE 6 TABLETS BY MOUTH ONCE A WEEK (PROTECT MEDICINE FROM LIGHT) 24 tablet 5   methylphenidate  (RITALIN ) 10 MG tablet Take 1/2-1 tablet daily as needed 30 tablet 0   Multiple Vitamins-Minerals (MULTIVITAMIN WITH MINERALS) tablet Take 1 tablet by mouth daily.     nystatin  (MYCOSTATIN /NYSTOP ) powder Apply 1 Application topically 2 (two) times daily as needed. 60 g 1   omeprazole  (PRILOSEC) 20 MG capsule Take 2 capsules (40 mg total) by mouth daily. 180 capsule 1   ondansetron  (ZOFRAN ) 4 MG tablet Take 1 tablet (4 mg total) by mouth every 8 (eight) hours as needed for nausea or vomiting. 20 tablet 0   potassium chloride  SA (KLOR-CON ) 20 MEQ tablet Take 1 tablet (20 mEq total) by mouth 2 (two) times a week. 26 tablet 0   propranolol  (INDERAL ) 20 MG tablet TAKE 1 TABLET (20 MG TOTAL) BY MOUTH 3 (THREE) TIMES DAILY. 270 tablet 3   sertraline  (ZOLOFT ) 100 MG tablet Take 2 tablets (200 mg total) by mouth daily. 180 tablet 1   topiramate  (TOPAMAX ) 50 MG  tablet Start with 50mg (1 tab) at bedtime. In 1 week increase to 100mg (2 tabs) at bedtime. 180 tablet 6   Vitamin D , Ergocalciferol , (DRISDOL ) 1.25 MG (50000 UNIT) CAPS capsule Take 1 capsule (50,000 Units total) by mouth 2 (two) times a week. 12 capsule 0   No facility-administered medications prior to visit.    Review of Systems  Constitutional:  Positive for malaise/fatigue. Negative for chills, fever and weight loss.  HENT:  Negative for congestion and sore throat.   Respiratory:  Positive for shortness of breath. Negative for cough, hemoptysis, sputum production and wheezing.   Cardiovascular:  Positive for chest pain. Negative for palpitations, orthopnea, claudication, leg swelling and PND.  Gastrointestinal:  Negative for abdominal pain, heartburn and vomiting.  Genitourinary: Negative.   Musculoskeletal:  Negative for joint pain and myalgias.  Neurological:  Negative for dizziness, focal weakness and headaches.  Endo/Heme/Allergies: Negative.   Psychiatric/Behavioral: Negative.      Objective:   Vitals:   05/09/23 1524  BP: 118/74  Pulse: 96  SpO2: 98%  Weight: (!) 380 lb (172.4 kg)  Height: 5' 7 (1.702 m)    Physical Exam Constitutional:      General: She is not in acute distress.    Appearance: She is obese. She is not ill-appearing.  HENT:     Head: Normocephalic and atraumatic.  Eyes:     Conjunctiva/sclera: Conjunctivae normal.  Cardiovascular:     Rate and Rhythm: Normal rate and regular rhythm.     Pulses: Normal pulses.     Heart sounds: Normal heart sounds. No murmur heard. Pulmonary:     Effort: Pulmonary effort is normal.     Breath sounds: No decreased breath sounds, wheezing, rhonchi or rales.  Musculoskeletal:  Right lower leg: No edema.     Left lower leg: No edema.  Skin:    General: Skin is warm and dry.  Neurological:     Mental Status: She is alert.    CBC    Component Value Date/Time   WBC 8.9 05/09/2023 1627   RBC 4.69  05/09/2023 1627   HGB 14.4 05/09/2023 1627   HGB 14.6 02/13/2023 1241   HGB 9.3 (L) 08/26/2019 1045   HCT 43.2 05/09/2023 1627   HCT 33.4 (L) 08/26/2019 1045   PLT 161.0 05/09/2023 1627   PLT 133 (L) 02/13/2023 1241   PLT 234 08/26/2019 1045   MCV 92.0 05/09/2023 1627   MCV 72 (L) 08/26/2019 1045   MCH 30.1 02/13/2023 1241   MCHC 33.3 05/09/2023 1627   RDW 16.2 (H) 05/09/2023 1627   RDW 15.4 08/26/2019 1045   LYMPHSABS 1.3 05/09/2023 1627   MONOABS 0.2 05/09/2023 1627   EOSABS 0.1 05/09/2023 1627   BASOSABS 0.1 05/09/2023 1627      Latest Ref Rng & Units 05/09/2023    4:27 PM 01/08/2023    1:05 PM 07/31/2022    3:01 PM  BMP  Glucose 70 - 99 mg/dL 85  876  898   BUN 6 - 23 mg/dL 8  11  9    Creatinine 0.40 - 1.20 mg/dL 9.28  9.26  9.28   Sodium 135 - 145 mEq/L 138  140  136   Potassium 3.5 - 5.1 mEq/L 4.4  3.7  3.9   Chloride 96 - 112 mEq/L 103  103  101   CO2 19 - 32 mEq/L 25  27  25    Calcium 8.4 - 10.5 mg/dL 8.9  8.9  9.4    Chest imaging: CXR 07/31/22 The heart size and mediastinal contours are within normal limits. Both lungs are clear. The visualized skeletal structures are unremarkable.  CT Chest 07/03/21 1. Significant improvement in the mediastinal lymphadenopathy seen previously, with index lesions described above. No pathologic adenopathy identified on this exam. Please note limitations for evaluation of hilar adenopathy without IV contrast. 2. Interval resolution of the bilateral pulmonary nodules. No new nodules or masses. 3. Stable splenomegaly, only partially visualized on this study due to slice selection. 4. Small hiatal hernia.  PET Scan 04/27/20 Multifocal hypermetabolic nodal activity throughout the mediastinum, hilar regions and porta hepatis. Multiple nonspecific small pulmonary nodules are again noted bilaterally, stable from recent studies, but also new from baseline exam. The largest nodule in the right upper lobe is hypermetabolic  CT Chest w  contrast 03/14/20 Mild interval progression of thoracic adenopathy. Numerous pulmonary nodules that remain stable  CTA 12/17/19 Mediastinal and hilar adenopathy similar to scan on 11/23/19 Stable scattered pulmonary nodules  CTA Coronary 11/23/19 Enlarged subcarinal node. Bilateral hilar and infrahilar boderlinr to mild adenopathy. Bilateral pulmonary nodules, subpleural distribution.   CTA Chest 06/29/19 Mediastinum/Nodes: No enlarged mediastinal, hilar, or axillary lymph nodes. Thyroid  gland, trachea, and esophagus demonstrate no significant findings. Lungs/Pleura: No acute pleural or parenchymal lung disease. Central airways are widely patent.  PFT:    Latest Ref Rng & Units 09/27/2020   12:58 PM  PFT Results  FVC-Pre L 4.60   FVC-Predicted Pre % 110   FVC-Post L 4.45   FVC-Predicted Post % 107   Pre FEV1/FVC % % 78   Post FEV1/FCV % % 79   FEV1-Pre L 3.60   FEV1-Predicted Pre % 103   FEV1-Post L 3.54   DLCO uncorrected ml/min/mmHg  30.14   DLCO UNC% % 121   DLCO corrected ml/min/mmHg 29.61   DLCO COR %Predicted % 119   DLVA Predicted % 109   PFT 2022: Normal Spirometry and lung volumes, increased diffusion  Labs: 07/26/20 - Hep B & C negative  Path: 06/27/20 - Surgical Path: LN 7, 10R show well formed granulomas concerning for sarcoidosis. Flow pathology negative.  Echo 09/14/19: EF 60-65%, Normal LV function. RV systolic function is normal. No valvular abnormalities.   Sleep Study: 01/2016 - AHI 3.5/hr -No central sleep apneas - Minimal to no oxygen desaturations    Assessment & Plan:   Sarcoidosis - Plan: CT Chest Wo Contrast  Therapeutic drug monitoring - Plan: CBC with Differential/Platelet, Comp Met (CMET), Comp Met (CMET), CBC with Differential/Platelet  Discussion: Redonna Wilbert is a 33 year old woman, non-smoker with history of anemia, hypertension and obesity who returns to pulmonary clinic for chest discomfort, shortness of breath and mediastinal  lymphadenopathy found to have sarcoidosis via mediastinoscopy on 06/27/20.   She is to continue methotrexate  therapy with goal of 15mg  weekly. She is to take 2mg  daily of folic acid .  We will repeat CT Chest scan, if findings remain stable will work on getting her off methotrexate  and monitor off therapy.   Check labs today.  Follow up in 3 months.   Dorn Chill, MD [facility] Office: [phone]   Current Outpatient Medications:    albuterol  (VENTOLIN  HFA) 108 (90 Base) MCG/ACT inhaler, Inhale 2 puffs into the lungs every 6 (six) hours as needed for wheezing or shortness of breath., Disp: 8 g, Rfl: 2   aspirin -acetaminophen -caffeine  (EXCEDRIN  MIGRAINE) 250-250-65 MG tablet, Take 2 tablets by mouth daily as needed for headache or migraine., Disp: , Rfl:    clotrimazole -betamethasone  (LOTRISONE ) cream, Apply topically., Disp: , Rfl:    diphenhydrAMINE  HCl, Sleep, (ZZZQUIL PO), Take by mouth at bedtime as needed., Disp: , Rfl:    folic acid  (FOLVITE ) 1 MG tablet, TAKE 2 TABLETS BY MOUTH DAILY, Disp: 60 tablet, Rfl: 3   furosemide  (LASIX ) 20 MG tablet, TAKE 1 TABLET BY MOUTH TWO TIMES A WEEK, Disp: 26 tablet, Rfl: 5   gabapentin  (NEURONTIN ) 300 MG capsule, Take 1 capsule (300 mg total) by mouth 2 (two) times daily., Disp: 180 capsule, Rfl: 1   Iron -FA-B Cmp-C-Biot-Probiotic (FUSION PLUS) CAPS, One capsule twice daily with orange juice. Stop your Ferrous sulfate ., Disp: 30 capsule, Rfl: 1   levonorgestrel  (MIRENA , 52 MG,) 20 MCG/DAY IUD, 1 each by Intrauterine route once., Disp: , Rfl:    levothyroxine  (SYNTHROID ) 200 MCG tablet, TAKE 1 TABLET BY MOUTH DAILY, Disp: 90 tablet, Rfl: 0   LORazepam  (ATIVAN ) 0.5 MG tablet, Take 1 tablet (0.5 mg total) by mouth every 8 (eight) hours as needed for anxiety., Disp: 30 tablet, Rfl: 4   lurasidone  (LATUDA ) 40 MG TABS tablet, Take 1 tablet (40 mg total) by mouth daily with supper., Disp: 90 tablet, Rfl: 1   meloxicam  (MOBIC )  7.5 MG tablet, Take 1 tablet (7.5 mg total) by mouth daily., Disp: 14 tablet, Rfl: 0   methocarbamol  (ROBAXIN ) 500 MG tablet, Take 1 tablet (500 mg total) by mouth every 8 (eight) hours as needed for muscle spasms., Disp: 20 tablet, Rfl: 0   methotrexate  (RHEUMATREX) 2.5 MG tablet, TAKE 6 TABLETS BY MOUTH ONCE A WEEK (PROTECT MEDICINE FROM LIGHT), Disp: 24 tablet, Rfl: 5   methylphenidate  (RITALIN ) 10 MG tablet, Take 1/2-1 tablet daily as needed, Disp: 30 tablet, Rfl:  0   Multiple Vitamins-Minerals (MULTIVITAMIN WITH MINERALS) tablet, Take 1 tablet by mouth daily., Disp: , Rfl:    nystatin  (MYCOSTATIN /NYSTOP ) powder, Apply 1 Application topically 2 (two) times daily as needed., Disp: 60 g, Rfl: 1   omeprazole  (PRILOSEC) 20 MG capsule, Take 2 capsules (40 mg total) by mouth daily., Disp: 180 capsule, Rfl: 1   ondansetron  (ZOFRAN ) 4 MG tablet, Take 1 tablet (4 mg total) by mouth every 8 (eight) hours as needed for nausea or vomiting., Disp: 20 tablet, Rfl: 0   potassium chloride  SA (KLOR-CON ) 20 MEQ tablet, Take 1 tablet (20 mEq total) by mouth 2 (two) times a week., Disp: 26 tablet, Rfl: 0   propranolol  (INDERAL ) 20 MG tablet, TAKE 1 TABLET (20 MG TOTAL) BY MOUTH 3 (THREE) TIMES DAILY., Disp: 270 tablet, Rfl: 3   sertraline  (ZOLOFT ) 100 MG tablet, Take 2 tablets (200 mg total) by mouth daily., Disp: 180 tablet, Rfl: 1   topiramate  (TOPAMAX ) 50 MG tablet, Start with 50mg (1 tab) at bedtime. In 1 week increase to 100mg (2 tabs) at bedtime., Disp: 180 tablet, Rfl: 6   Vitamin D , Ergocalciferol , (DRISDOL ) 1.25 MG (50000 UNIT) CAPS capsule, Take 1 capsule (50,000 Units total) by mouth 2 (two) times a week., Disp: 12 capsule, Rfl: 0

## 2023-05-10 ENCOUNTER — Ambulatory Visit (INDEPENDENT_AMBULATORY_CARE_PROVIDER_SITE_OTHER): Payer: BC Managed Care – PPO | Admitting: Family

## 2023-05-10 ENCOUNTER — Encounter: Payer: Self-pay | Admitting: Pulmonary Disease

## 2023-05-10 VITALS — BP 122/80 | HR 89 | Temp 98.7°F | Resp 16 | Ht 67.0 in | Wt 279.0 lb

## 2023-05-10 DIAGNOSIS — G43909 Migraine, unspecified, not intractable, without status migrainosus: Secondary | ICD-10-CM | POA: Diagnosis not present

## 2023-05-10 DIAGNOSIS — M545 Low back pain, unspecified: Secondary | ICD-10-CM | POA: Diagnosis not present

## 2023-05-10 LAB — COMPREHENSIVE METABOLIC PANEL
ALT: 33 U/L (ref 0–35)
AST: 69 U/L — ABNORMAL HIGH (ref 0–37)
Albumin: 4.5 g/dL (ref 3.5–5.2)
Alkaline Phosphatase: 80 U/L (ref 39–117)
BUN: 8 mg/dL (ref 6–23)
CO2: 25 meq/L (ref 19–32)
Calcium: 8.9 mg/dL (ref 8.4–10.5)
Chloride: 103 meq/L (ref 96–112)
Creatinine, Ser: 0.71 mg/dL (ref 0.40–1.20)
GFR: 111.92 mL/min (ref >60.00)
Glucose, Bld: 85 mg/dL (ref 70–99)
Potassium: 4.4 meq/L (ref 3.5–5.1)
Sodium: 138 meq/L (ref 135–145)
Total Bilirubin: 1.2 mg/dL (ref 0.2–1.2)
Total Protein: 7.8 g/dL (ref 6.0–8.3)

## 2023-05-10 LAB — CBC WITH DIFFERENTIAL/PLATELET
Basophils Absolute: 0.1 10*3/uL (ref 0.0–0.1)
Basophils Relative: 0.6 % (ref 0.0–3.0)
Eosinophils Absolute: 0.1 10*3/uL (ref 0.0–0.7)
Eosinophils Relative: 1.5 % (ref 0.0–5.0)
HCT: 43.2 % (ref 36.0–46.0)
Hemoglobin: 14.4 g/dL (ref 12.0–15.0)
Lymphocytes Relative: 14.4 % (ref 12.0–46.0)
Lymphs Abs: 1.3 10*3/uL (ref 0.7–4.0)
MCHC: 33.3 g/dL (ref 30.0–36.0)
MCV: 92 fL (ref 78.0–100.0)
Monocytes Absolute: 0.2 10*3/uL (ref 0.1–1.0)
Monocytes Relative: 1.7 % — ABNORMAL LOW (ref 3.0–12.0)
Neutro Abs: 7.3 10*3/uL (ref 1.4–7.7)
Neutrophils Relative %: 81.8 % — ABNORMAL HIGH (ref 43.0–77.0)
Platelets: 161 10*3/uL (ref 150.0–400.0)
RBC: 4.69 Mil/uL (ref 3.87–5.11)
RDW: 16.2 % — ABNORMAL HIGH (ref 11.5–15.5)
WBC: 8.9 10*3/uL (ref 4.0–10.5)

## 2023-05-10 MED ORDER — METHOCARBAMOL 500 MG PO TABS: 500.0000 mg | ORAL_TABLET | Freq: Three times a day (TID) | ORAL | 0 refills | Status: AC | PRN

## 2023-05-10 MED ORDER — METHYLPREDNISOLONE 4 MG PO TBPK
ORAL_TABLET | ORAL | 0 refills | Status: DC
Start: 2023-05-10 — End: 2023-05-24

## 2023-05-10 MED ORDER — RIZATRIPTAN BENZOATE 10 MG PO TABS: 10.0000 mg | ORAL_TABLET | ORAL | 0 refills | Status: DC | PRN

## 2023-05-10 MED ORDER — ONDANSETRON 4 MG PO TBDP: 4.0000 mg | ORAL_TABLET | Freq: Three times a day (TID) | ORAL | 0 refills | Status: AC | PRN

## 2023-05-10 NOTE — Progress Notes (Addendum)
 Subjective:     Patient ID: Margaret Golden, female    DOB: 1990/08/25, 33 y.o.   MRN: [id number]  Chief Complaint  Patient presents with   Headache    Patient complains of headache for about one week   Back Pain    Patient complains of back pain for a few weeks   Nausea    Complains of nausea for about a week, vomited twice in the past week    Headache  Associated symptoms include back pain.  Back Pain Associated symptoms include headaches.    Discussed the use of AI scribe software for clinical note transcription with the patient, who gave verbal consent to proceed.  History of Present Illness         Patient presents today to discuss several concerns.    She reports a 1 week history of headache which she describes as mostly pain behind her eyes.  Reports + phonophobia and photophobia. Using excedrin  migraine with minimal relief. Also has associated nausea intermittently over the last 1 week. Has vomitted twice.   Back pain- notes low back pain x several weeks. Non-radiating.  No injury, just wok up with the pain.      Health Maintenance Due  Topic Date Due   Cervical Cancer Screening (HPV/Pap Cotest)  05/18/2022    Past Medical History:  Diagnosis Date   Acid reflux    ADD (attention deficit disorder)    Anemia    Anxiety    Arthritis    left ankle   Back pain    Chest pain    Complication of anesthesia    Depression    Dyspnea    with exertion   Exercise-induced asthma    as a child   Fatty liver    History of blood transfusion    History of chicken pox    History of kidney stones    passed    Hypothyroidism    Joint pain    Lower extremity edema    Major depressive disorder    Migraines    chronic   Palpitations    Pneumonia    x 2   PONV (postoperative nausea and vomiting)    nausea   RLS (restless legs syndrome)    Sleep apnea 2021   Thyroid  disease    hypothyroid    Past Surgical History:  Procedure Laterality Date   BRONCHIAL  NEEDLE ASPIRATION BIOPSY  03/31/2020   Procedure: BRONCHIAL NEEDLE ASPIRATION BIOPSIES;  Surgeon: Brenna Adine CROME, DO;  Location: [facility];  Service: Pulmonary;;   BRONCHIAL WASHINGS  03/31/2020   Procedure: BRONCHIAL WASHINGS;  Surgeon: Brenna Adine CROME, DO;  Location: [facility];  Service: Pulmonary;;   DILATATION & CURETTAGE/HYSTEROSCOPY WITH MYOSURE N/A 03/22/2020   Procedure: DILATATION & CURETTAGE/HYSTEROSCOPY WITH MYOSURE;  Surgeon: Jannis Kate Norris, MD;  Location[facility] OR;  Service: Gynecology;  Laterality: N/A;   INTERCOSTAL NERVE BLOCK Right 06/27/2020   Procedure: INTERCOSTAL NERVE BLOCK;  Surgeon: Shyrl Linnie KIDD, MD;  Location: [facility] OR;  Service: Thoracic;  Laterality: Right;   INTRAUTERINE DEVICE (IUD) INSERTION N/A 03/22/2020   Procedure: INTRAUTERINE DEVICE (IUD) INSERTION;  Surgeon: Jannis Kate Norris, MD;  Location: [facility] OR;  Service: Gynecology;  Laterality: N/A;  Mirena  IUD insertion   KNEE ARTHROSCOPY Right 2007   LYMPH NODE BIOPSY Right 06/27/2020   Procedure: LYMPH NODE BIOPSY;  Surgeon: Shyrl Linnie KIDD, MD;  Location: [facility] OR;  Service: Thoracic;  Laterality: Right;   TONSILLECTOMY  2010   TONSILLECTOMY     VIDEO BRONCHOSCOPY WITH ENDOBRONCHIAL ULTRASOUND N/A 03/31/2020   Procedure: VIDEO BRONCHOSCOPY WITH ENDOBRONCHIAL ULTRASOUND;  Surgeon: Brenna Adine CROME, DO;  Location: [facility];  Service: Pulmonary;  Laterality: N/A;   WISDOM TOOTH EXTRACTION     WISDOM TOOTH EXTRACTION Bilateral    upper and lower    Family History  Problem Relation Age of Onset   Depression Mother    Obesity Mother        died from an infection ? MRSA   Depression Father    Sleep apnea Father    Obesity Father    Arthritis Maternal Grandmother    Breast cancer Maternal Grandmother        in remission   Diabetes Maternal Grandfather    Arthritis Maternal Grandfather    Arthritis Paternal Grandmother    Arthritis Paternal Grandfather    Cancer Paternal Aunt         colon   Cancer Paternal Uncle        brain cancer   Migraines Neg Hx     Social History   Socioeconomic History   Marital status: Single    Spouse name: Not on file   Number of children: 0   Years of education: 12   Highest education level: Not on file  Occupational History   Occupation: Conservation officer, historic buildings  Tobacco Use   Smoking status: Never    Passive exposure: Never   Smokeless tobacco: Never  Vaping Use   Vaping status: Never Used  Substance and Sexual Activity   Alcohol use: Not Currently    Comment: rare social drink maybe once every 5 blue moons   Drug use: No   Sexual activity: Never    Comment: never sexually active  Other Topics Concern   Not on file  Social History Narrative   Lives alone --update 11/11/19   Seeking work   Only child    Lives in a one story house, has 2 cats      Caffeine : 2-4 cups/day   Social Drivers of Corporate Investment Banker Strain: Not on file  Food Insecurity: Not on file  Transportation Needs: Not on file  Physical Activity: Not on file  Stress: Not on file  Social Connections: Not on file  Intimate Partner Violence: Not on file    Outpatient Medications Prior to Visit  Medication Sig Dispense Refill   albuterol  (VENTOLIN  HFA) 108 (90 Base) MCG/ACT inhaler Inhale 2 puffs into the lungs every 6 (six) hours as needed for wheezing or shortness of breath. 8 g 2   aspirin -acetaminophen -caffeine  (EXCEDRIN  MIGRAINE) 250-250-65 MG tablet Take 2 tablets by mouth daily as needed for headache or migraine.     clotrimazole -betamethasone  (LOTRISONE ) cream Apply topically.     diphenhydrAMINE  HCl, Sleep, (ZZZQUIL PO) Take by mouth at bedtime as needed.     folic acid  (FOLVITE ) 1 MG tablet TAKE 2 TABLETS BY MOUTH DAILY 60 tablet 3   furosemide  (LASIX ) 20 MG tablet TAKE 1 TABLET BY MOUTH TWO TIMES A WEEK 26 tablet 5   gabapentin  (NEURONTIN ) 300 MG capsule Take 1 capsule (300 mg total) by mouth 2 (two) times daily. 180 capsule 1    Iron -FA-B Cmp-C-Biot-Probiotic (FUSION PLUS) CAPS One capsule twice daily with orange juice. Stop your Ferrous sulfate . 30 capsule 1   levonorgestrel  (MIRENA , 52 MG,) 20 MCG/DAY IUD 1 each by Intrauterine route once.     levothyroxine  (SYNTHROID ) 200 MCG tablet TAKE 1 TABLET  BY MOUTH DAILY 90 tablet 0   LORazepam  (ATIVAN ) 0.5 MG tablet Take 1 tablet (0.5 mg total) by mouth every 8 (eight) hours as needed for anxiety. 30 tablet 4   lurasidone  (LATUDA ) 40 MG TABS tablet Take 1 tablet (40 mg total) by mouth daily with supper. 90 tablet 1   meloxicam  (MOBIC ) 7.5 MG tablet Take 1 tablet (7.5 mg total) by mouth daily. 14 tablet 0   methotrexate  (RHEUMATREX) 2.5 MG tablet TAKE 6 TABLETS BY MOUTH ONCE A WEEK (PROTECT MEDICINE FROM LIGHT) 24 tablet 5   methylphenidate  (RITALIN ) 10 MG tablet Take 1/2-1 tablet daily as needed 30 tablet 0   Multiple Vitamins-Minerals (MULTIVITAMIN WITH MINERALS) tablet Take 1 tablet by mouth daily.     nystatin  (MYCOSTATIN /NYSTOP ) powder Apply 1 Application topically 2 (two) times daily as needed. 60 g 1   omeprazole  (PRILOSEC) 20 MG capsule Take 2 capsules (40 mg total) by mouth daily. 180 capsule 1   ondansetron  (ZOFRAN ) 4 MG tablet Take 1 tablet (4 mg total) by mouth every 8 (eight) hours as needed for nausea or vomiting. 20 tablet 0   potassium chloride  SA (KLOR-CON ) 20 MEQ tablet Take 1 tablet (20 mEq total) by mouth 2 (two) times a week. 26 tablet 0   propranolol  (INDERAL ) 20 MG tablet TAKE 1 TABLET (20 MG TOTAL) BY MOUTH 3 (THREE) TIMES DAILY. 270 tablet 3   sertraline  (ZOLOFT ) 100 MG tablet Take 2 tablets (200 mg total) by mouth daily. 180 tablet 1   topiramate  (TOPAMAX ) 50 MG tablet Start with 50mg (1 tab) at bedtime. In 1 week increase to 100mg (2 tabs) at bedtime. 180 tablet 6   Vitamin D , Ergocalciferol , (DRISDOL ) 1.25 MG (50000 UNIT) CAPS capsule Take 1 capsule (50,000 Units total) by mouth 2 (two) times a week. 12 capsule 0   methocarbamol  (ROBAXIN ) 500 MG tablet  Take 1 tablet (500 mg total) by mouth every 8 (eight) hours as needed for muscle spasms. 20 tablet 0   No facility-administered medications prior to visit.    Allergies  Allergen Reactions   Augmentin [Amoxicillin -Pot Clavulanate] Hives    Review of Systems  Musculoskeletal:  Positive for back pain.  Neurological:  Positive for headaches.       Objective:    Physical Exam Constitutional:      General: She is not in acute distress.    Appearance: Normal appearance. She is well-developed.  HENT:     Head: Normocephalic and atraumatic.     Right Ear: External ear normal.     Left Ear: External ear normal.  Eyes:     General: No scleral icterus. Neck:     Thyroid : No thyromegaly.  Cardiovascular:     Rate and Rhythm: Normal rate and regular rhythm.     Heart sounds: Normal heart sounds. No murmur heard. Pulmonary:     Effort: Pulmonary effort is normal. No respiratory distress.     Breath sounds: Normal breath sounds. No wheezing.  Musculoskeletal:     Cervical back: Neck supple.  Skin:    General: Skin is warm and dry.  Neurological:     Mental Status: She is alert and oriented to person, place, and time.     Deep Tendon Reflexes:     Reflex Scores:      Patellar reflexes are 1+ on the right side and 1+ on the left side.    Comments: Bilateral LE strength is 5/5   Psychiatric:        Mood  and Affect: Mood normal.        Behavior: Behavior normal.        Thought Content: Thought content normal.        Judgment: Judgment normal.      BP 122/80 (BP Location: Right Arm, Patient Position: Sitting, Cuff Size: Large)   Pulse 89   Temp 98.7 F (37.1 C) (Oral)   Resp 16   Ht 5' 7 (1.702 m)   Wt (!) 379 lb (171.9 kg)   SpO2 97%   BMI 59.36 kg/m  Wt Readings from Last 3 Encounters:  05/10/23 (!) 379 lb (171.9 kg)  05/09/23 (!) 380 lb (172.4 kg)  02/13/23 (!) 371 lb 0.6 oz (168.3 kg)       Assessment & Plan:   Problem List Items Addressed This Visit        Unprioritized   Migraines - Primary   Uncontolled. Recommended trial of maxalt - notes that it doesn't work as fast as excedrin , but may take together and then can repeat maxalt  once in 2 hours as needed. Add zofran  as needed for nausea. Offered toradol  injection, pt declines.       Relevant Medications   rizatriptan  (MAXALT ) 10 MG tablet   ondansetron  (ZOFRAN -ODT) 4 MG disintegrating tablet   methocarbamol  (ROBAXIN ) 500 MG tablet   Back pain   Uncontrolled. Really impacting her ADL's. Will rx with medrol  dose pak and robaxin  prn.       Relevant Medications   methocarbamol  (ROBAXIN ) 500 MG tablet   methylPREDNISolone  (MEDROL  DOSEPAK) 4 MG TBPK tablet    I am having Margaret Golden start on rizatriptan , ondansetron , and methylPREDNISolone . I am also having her maintain her multivitamin with minerals, Fusion Plus, topiramate , potassium chloride  SA, aspirin -acetaminophen -caffeine , furosemide , Mirena  (52 MG), (diphenhydrAMINE  HCl, Sleep, (ZZZQUIL PO)), Vitamin D  (Ergocalciferol ), albuterol , ondansetron , meloxicam , propranolol , nystatin , omeprazole , clotrimazole -betamethasone , methotrexate , gabapentin , LORazepam , lurasidone , methylphenidate , sertraline , folic acid , levothyroxine , and methocarbamol .  Meds ordered this encounter  Medications   rizatriptan  (MAXALT ) 10 MG tablet    Sig: Take 1 tablet (10 mg total) by mouth as needed for migraine. May repeat in 2 hours if needed    Dispense:  10 tablet    Refill:  0    Supervising Provider:   DOMENICA BLACKBIRD A [[id number]]   ondansetron  (ZOFRAN -ODT) 4 MG disintegrating tablet    Sig: Take 1 tablet (4 mg total) by mouth every 8 (eight) hours as needed for nausea or vomiting.    Dispense:  20 tablet    Refill:  0    Supervising Provider:   DOMENICA BLACKBIRD A [[id number]]   methocarbamol  (ROBAXIN ) 500 MG tablet    Sig: Take 1 tablet (500 mg total) by mouth every 8 (eight) hours as needed for muscle spasms.    Dispense:  20 tablet    Refill:  0     Supervising Provider:   DOMENICA BLACKBIRD A [[id number]]   methylPREDNISolone  (MEDROL  DOSEPAK) 4 MG TBPK tablet    Sig: Please take per package instructions    Dispense:  21 tablet    Refill:  0    Supervising Provider:   DOMENICA BLACKBIRD A [[id number]]

## 2023-05-11 NOTE — Assessment & Plan Note (Addendum)
 Uncontolled. Recommended trial of maxalt - notes that it doesn't work as fast as excedrin , but may take together and then can repeat maxalt  once in 2 hours as needed. Add zofran  as needed for nausea. Offered toradol  injection, pt declines.

## 2023-05-11 NOTE — Assessment & Plan Note (Signed)
 Uncontrolled. Really impacting her ADL's. Will rx with medrol  dose pak and robaxin  prn.

## 2023-05-13 DIAGNOSIS — F4323 Adjustment disorder with mixed anxiety and depressed mood: Secondary | ICD-10-CM | POA: Diagnosis not present

## 2023-05-20 DIAGNOSIS — F4323 Adjustment disorder with mixed anxiety and depressed mood: Secondary | ICD-10-CM | POA: Diagnosis not present

## 2023-05-24 ENCOUNTER — Ambulatory Visit: Payer: BC Managed Care – PPO | Admitting: Family

## 2023-05-24 VITALS — BP 134/85 | HR 83 | Temp 98.5°F | Resp 16 | Ht 67.0 in | Wt 285.0 lb

## 2023-05-24 DIAGNOSIS — G43909 Migraine, unspecified, not intractable, without status migrainosus: Secondary | ICD-10-CM

## 2023-05-24 DIAGNOSIS — M19071 Primary osteoarthritis, right ankle and foot: Secondary | ICD-10-CM

## 2023-05-24 MED ORDER — TOPIRAMATE 50 MG PO TABS: ORAL_TABLET | ORAL | 0 refills | Status: DC

## 2023-05-24 MED ORDER — KETOROLAC TROMETHAMINE 60 MG/2ML IM SOLN
60.0000 mg | Freq: Once | INTRAMUSCULAR | Status: AC
  Administered 2023-05-24: 60 mg via INTRAMUSCULAR

## 2023-05-24 NOTE — Assessment & Plan Note (Signed)
  Persistent headache located above and behind the eyes, with associated photophobia and nausea. Partial response to excedrin and Maxalt. Topamax previously prescribed but not currently in use. -Administer Toradol injection today to attempt to break the migraine cycle. -Resume Topamax 100mg  at bedtime for migraine prophylaxis, prescription sent to Karin Golden. -If no improvement in 1 week, consider referral back to [facility].

## 2023-05-24 NOTE — Progress Notes (Signed)
Subjective:     Patient ID: Margaret Golden, female    DOB: 06-14-1990, 33 y.o.   MRN: [id number]  Chief Complaint  Patient presents with   Migraine    Here for follow up, not better with Maxalt    HPI  Discussed the use of AI scribe software for clinical note transcription with the patient, who gave verbal consent to proceed.  History of Present Illness              Health Maintenance Due  Topic Date Due   Cervical Cancer Screening (HPV/Pap Cotest)  05/18/2022   COVID-19 Vaccine (4 - 2024-25 season) 05/24/2023    Past Medical History:  Diagnosis Date   Acid reflux    ADD (attention deficit disorder)    Anemia    Anxiety    Arthritis    left ankle   Back pain    Chest pain    Complication of anesthesia    Depression    Dyspnea    with exertion   Exercise-induced asthma    as a child   Fatty liver    History of blood transfusion    History of chicken pox    History of kidney stones    passed    Hypothyroidism    Joint pain    Lower extremity edema    Major depressive disorder    Migraines    chronic   Palpitations    Pneumonia    x 2   PONV (postoperative nausea and vomiting)    nausea   RLS (restless legs syndrome)    Sleep apnea 2021   Thyroid disease    hypothyroid    Past Surgical History:  Procedure Laterality Date   BRONCHIAL NEEDLE ASPIRATION BIOPSY  03/31/2020   Procedure: BRONCHIAL NEEDLE ASPIRATION BIOPSIES;  Surgeon: Josephine Igo, DO;  Location: [facility];  Service: Pulmonary;;   BRONCHIAL WASHINGS  03/31/2020   Procedure: BRONCHIAL WASHINGS;  Surgeon: Josephine Igo, DO;  Location: [facility];  Service: Pulmonary;;   DILATATION & CURETTAGE/HYSTEROSCOPY WITH MYOSURE N/A 03/22/2020   Procedure: DILATATION & CURETTAGE/HYSTEROSCOPY WITH MYOSURE;  Surgeon: Romualdo Bolk, MD;  Location: [facility] OR;  Service: Gynecology;  Laterality: N/A;   INTERCOSTAL NERVE BLOCK Right 06/27/2020   Procedure: INTERCOSTAL NERVE BLOCK;   Surgeon: Corliss Skains, MD;  Location: [facility] OR;  Service: Thoracic;  Laterality: Right;   INTRAUTERINE DEVICE (IUD) INSERTION N/A 03/22/2020   Procedure: INTRAUTERINE DEVICE (IUD) INSERTION;  Surgeon: Romualdo Bolk, MD;  Location: [facility] OR;  Service: Gynecology;  Laterality: N/A;  Mirena IUD insertion   KNEE ARTHROSCOPY Right 2007   LYMPH NODE BIOPSY Right 06/27/2020   Procedure: LYMPH NODE BIOPSY;  Surgeon: Corliss Skains, MD;  Location: [facility] OR;  Service: Thoracic;  Laterality: Right;   TONSILLECTOMY  2010   TONSILLECTOMY     VIDEO BRONCHOSCOPY WITH ENDOBRONCHIAL ULTRASOUND N/A 03/31/2020   Procedure: VIDEO BRONCHOSCOPY WITH ENDOBRONCHIAL ULTRASOUND;  Surgeon: Josephine Igo, DO;  Location: [facility];  Service: Pulmonary;  Laterality: N/A;   WISDOM TOOTH EXTRACTION     WISDOM TOOTH EXTRACTION Bilateral    upper and lower    Family History  Problem Relation Age of Onset   Depression Mother    Obesity Mother        died from "an infection" ? MRSA   Depression Father    Sleep apnea Father    Obesity Father    Arthritis Maternal Grandmother  Breast cancer Maternal Grandmother        in remission   Diabetes Maternal Grandfather    Arthritis Maternal Grandfather    Arthritis Paternal Grandmother    Arthritis Paternal Grandfather    Cancer Paternal Aunt        colon   Cancer Paternal Uncle        brain cancer   Migraines Neg Hx     Social History   Socioeconomic History   Marital status: Single    Spouse name: Not on file   Number of children: 0   Years of education: 12   Highest education level: Not on file  Occupational History   Occupation: Conservation officer, historic buildings  Tobacco Use   Smoking status: Never    Passive exposure: Never   Smokeless tobacco: Never  Vaping Use   Vaping status: Never Used  Substance and Sexual Activity   Alcohol use: Not Currently    Comment: rare social drink maybe once "every 5 blue moons"   Drug use: No   Sexual activity:  Never    Comment: never sexually active  Other Topics Concern   Not on file  Social History Narrative   Lives alone --update 11/11/19   Seeking work   Only child    Lives in a one story house, has 2 cats      Caffeine: 2-4 cups/day   Social Drivers of Corporate investment banker Strain: Not on file  Food Insecurity: Not on file  Transportation Needs: Not on file  Physical Activity: Not on file  Stress: Not on file  Social Connections: Not on file  Intimate Partner Violence: Not on file    Outpatient Medications Prior to Visit  Medication Sig Dispense Refill   albuterol (VENTOLIN HFA) 108 (90 Base) MCG/ACT inhaler Inhale 2 puffs into the lungs every 6 (six) hours as needed for wheezing or shortness of breath. 8 g 2   aspirin-acetaminophen-caffeine (EXCEDRIN MIGRAINE) 250-250-65 MG tablet Take 2 tablets by mouth daily as needed for headache or migraine.     clotrimazole-betamethasone (LOTRISONE) cream Apply topically.     diphenhydrAMINE HCl, Sleep, (ZZZQUIL PO) Take by mouth at bedtime as needed.     folic acid (FOLVITE) 1 MG tablet TAKE 2 TABLETS BY MOUTH DAILY 60 tablet 3   furosemide (LASIX) 20 MG tablet TAKE 1 TABLET BY MOUTH TWO TIMES A WEEK 26 tablet 5   gabapentin (NEURONTIN) 300 MG capsule Take 1 capsule (300 mg total) by mouth 2 (two) times daily. 180 capsule 1   Iron-FA-B Cmp-C-Biot-Probiotic (FUSION PLUS) CAPS One capsule twice daily with orange juice. Stop your Ferrous sulfate. 30 capsule 1   levonorgestrel (MIRENA, 52 MG,) 20 MCG/DAY IUD 1 each by Intrauterine route once.     levothyroxine (SYNTHROID) 200 MCG tablet TAKE 1 TABLET BY MOUTH DAILY 90 tablet 0   LORazepam (ATIVAN) 0.5 MG tablet Take 1 tablet (0.5 mg total) by mouth every 8 (eight) hours as needed for anxiety. 30 tablet 4   lurasidone (LATUDA) 40 MG TABS tablet Take 1 tablet (40 mg total) by mouth daily with supper. 90 tablet 1   meloxicam (MOBIC) 7.5 MG tablet Take 1 tablet (7.5 mg total) by mouth daily.  14 tablet 0   methocarbamol (ROBAXIN) 500 MG tablet Take 1 tablet (500 mg total) by mouth every 8 (eight) hours as needed for muscle spasms. 20 tablet 0   methotrexate (RHEUMATREX) 2.5 MG tablet TAKE 6 TABLETS BY MOUTH ONCE A WEEK (  PROTECT MEDICINE FROM LIGHT) 24 tablet 5   methylphenidate (RITALIN) 10 MG tablet Take 1/2-1 tablet daily as needed 30 tablet 0   Multiple Vitamins-Minerals (MULTIVITAMIN WITH MINERALS) tablet Take 1 tablet by mouth daily.     nystatin (MYCOSTATIN/NYSTOP) powder Apply 1 Application topically 2 (two) times daily as needed. 60 g 1   omeprazole (PRILOSEC) 20 MG capsule Take 2 capsules (40 mg total) by mouth daily. 180 capsule 1   ondansetron (ZOFRAN) 4 MG tablet Take 1 tablet (4 mg total) by mouth every 8 (eight) hours as needed for nausea or vomiting. 20 tablet 0   ondansetron (ZOFRAN-ODT) 4 MG disintegrating tablet Take 1 tablet (4 mg total) by mouth every 8 (eight) hours as needed for nausea or vomiting. 20 tablet 0   potassium chloride SA (KLOR-CON) 20 MEQ tablet Take 1 tablet (20 mEq total) by mouth 2 (two) times a week. 26 tablet 0   propranolol (INDERAL) 20 MG tablet TAKE 1 TABLET (20 MG TOTAL) BY MOUTH 3 (THREE) TIMES DAILY. 270 tablet 3   rizatriptan (MAXALT) 10 MG tablet Take 1 tablet (10 mg total) by mouth as needed for migraine. May repeat in 2 hours if needed 10 tablet 0   sertraline (ZOLOFT) 100 MG tablet Take 2 tablets (200 mg total) by mouth daily. 180 tablet 1   Vitamin D, Ergocalciferol, (DRISDOL) 1.25 MG (50000 UNIT) CAPS capsule Take 1 capsule (50,000 Units total) by mouth 2 (two) times a week. 12 capsule 0   topiramate (TOPAMAX) 50 MG tablet Start with 50mg (1 tab) at bedtime. In 1 week increase to 100mg (2 tabs) at bedtime. 180 tablet 6   methylPREDNISolone (MEDROL DOSEPAK) 4 MG TBPK tablet Please take per package instructions 21 tablet 0   No facility-administered medications prior to visit.    Allergies  Allergen Reactions   Augmentin  [Amoxicillin-Pot Clavulanate] Hives    ROS     Objective:    Physical Exam Constitutional:      General: She is not in acute distress.    Appearance: Normal appearance. She is well-developed.  HENT:     Head: Normocephalic and atraumatic.     Right Ear: External ear normal.     Left Ear: External ear normal.  Eyes:     General: No scleral icterus. Neck:     Thyroid: No thyromegaly.  Cardiovascular:     Rate and Rhythm: Normal rate and regular rhythm.     Heart sounds: Normal heart sounds. No murmur heard. Pulmonary:     Effort: Pulmonary effort is normal. No respiratory distress.     Breath sounds: Normal breath sounds. No wheezing.  Musculoskeletal:     Cervical back: Neck supple.  Skin:    General: Skin is warm and dry.  Neurological:     General: No focal deficit present.     Mental Status: She is alert and oriented to person, place, and time.     Cranial Nerves: No cranial nerve deficit.  Psychiatric:        Mood and Affect: Mood normal.        Behavior: Behavior normal.        Thought Content: Thought content normal.        Judgment: Judgment normal.      BP 134/85 (BP Location: Right Arm, Patient Position: Sitting)   Pulse 83   Temp 98.5 F (36.9 C) (Oral)   Resp 16   Ht 5\' 7"  (1.702 m)   Wt 285 lb (129.3 kg)  SpO2 100%   BMI 44.64 kg/m  Wt Readings from Last 3 Encounters:  05/24/23 285 lb (129.3 kg)  05/10/23 279 lb (126.6 kg)  05/09/23 (!) 380 lb (172.4 kg)       Assessment & Plan:   Problem List Items Addressed This Visit       Unprioritized   Migraines - Primary    Persistent headache located above and behind the eyes, with associated photophobia and nausea. Partial response to excedrin and Maxalt. Topamax previously prescribed but not currently in use. -Administer Toradol injection today to attempt to break the migraine cycle. -Resume Topamax 100mg  at bedtime for migraine prophylaxis, prescription sent to Karin Golden. -If no  improvement in 1 week, consider referral back to [facility].      Relevant Medications   topiramate (TOPAMAX) 50 MG tablet   Arthritis of right subtalar joint    Request for handicap placard due to worsening symptoms. -Complete paperwork for handicap placard.       I have discontinued [name] A. Doom's methylPREDNISolone. I am also having her maintain her multivitamin with minerals, Fusion Plus, potassium chloride SA, aspirin-acetaminophen-caffeine, furosemide, Mirena (52 MG), (diphenhydrAMINE HCl, Sleep, (ZZZQUIL PO)), Vitamin D (Ergocalciferol), albuterol, ondansetron, meloxicam, propranolol, nystatin, omeprazole, clotrimazole-betamethasone, methotrexate, gabapentin, LORazepam, lurasidone, methylphenidate, sertraline, folic acid, levothyroxine, rizatriptan, ondansetron, methocarbamol, and topiramate. We administered ketorolac.  Meds ordered this encounter  Medications   topiramate (TOPAMAX) 50 MG tablet    Sig: Start with 50mg (1 tab) at bedtime. In 1 week increase to 100mg (2 tabs) at bedtime.    Dispense:  180 tablet    Refill:  0    Supervising Provider:   Danise Edge A [[id number]]   ketorolac (TORADOL) injection 60 mg

## 2023-05-24 NOTE — Assessment & Plan Note (Signed)
  Request for handicap placard due to worsening symptoms. -Complete paperwork for handicap placard.

## 2023-05-24 NOTE — Patient Instructions (Signed)
VISIT SUMMARY:  Today, we addressed your persistent migraine headaches, which have not improved since your last visit. We also discussed your mild carpal tunnel syndrome and worsening ankle arthritis.  YOUR PLAN:  -MIGRAINE: A migraine is a type of headache characterized by intense pain, often accompanied by nausea and sensitivity to light. To help break the migraine cycle, you received a Toradol injection today. You should resume taking Topamax 100mg  at bedtime to help prevent future migraines. If your symptoms do not improve within a week, we may refer you back to [facility].  -CARPAL TUNNEL SYNDROME: Carpal tunnel syndrome is a condition that causes pain, numbness, and tingling in the hand and arm. Continue to manage your symptoms with the wrist brace as needed.  -ANKLE ARTHRITIS: Ankle arthritis is a condition that causes pain and stiffness in the ankle joint. We will complete the paperwork for a handicap placard to assist with your mobility.  INSTRUCTIONS:  Please follow up in one week if your migraine symptoms do not improve. Continue using your wrist brace for carpal tunnel syndrome as needed. We will complete the paperwork for your handicap placard for ankle arthritis.

## 2023-05-27 DIAGNOSIS — F4323 Adjustment disorder with mixed anxiety and depressed mood: Secondary | ICD-10-CM | POA: Diagnosis not present

## 2023-05-31 ENCOUNTER — Ambulatory Visit: Payer: BC Managed Care – PPO | Attending: Family

## 2023-05-31 ENCOUNTER — Ambulatory Visit: Payer: BC Managed Care – PPO | Admitting: Family

## 2023-05-31 VITALS — BP 114/65 | HR 80 | Temp 98.0°F | Resp 18 | Ht 67.0 in | Wt 383.0 lb

## 2023-05-31 DIAGNOSIS — E662 Morbid (severe) obesity with alveolar hypoventilation: Secondary | ICD-10-CM

## 2023-05-31 DIAGNOSIS — G43809 Other migraine, not intractable, without status migrainosus: Secondary | ICD-10-CM

## 2023-05-31 DIAGNOSIS — R000 Tachycardia, unspecified: Secondary | ICD-10-CM

## 2023-05-31 DIAGNOSIS — G4733 Obstructive sleep apnea (adult) (pediatric): Secondary | ICD-10-CM

## 2023-05-31 DIAGNOSIS — R7989 Other specified abnormal findings of blood chemistry: Secondary | ICD-10-CM

## 2023-05-31 NOTE — Assessment & Plan Note (Signed)
  Likely due to fatty liver. Stable and unchanged. -Continue monitoring.

## 2023-05-31 NOTE — Assessment & Plan Note (Signed)
  Patient has been diagnosed but is non-compliant with CPAP due to discomfort and lack of perceived benefit. Discussed potential for headaches and long-term cardiac risks with untreated sleep apnea. -Consider alternative mask styles or devices for sleep apnea treatment.

## 2023-05-31 NOTE — Progress Notes (Unsigned)
 EP to read.

## 2023-05-31 NOTE — Patient Instructions (Signed)
 VISIT SUMMARY:  Today, we discussed your persistent headaches, elevated heart rate, sleep apnea, weight management, and liver health. We have outlined a plan to address each of these issues and will follow up in six months to monitor your progress.  YOUR PLAN:  -PERSISTENT HEADACHE: Your migraine has changed into a persistent headache that feels like a 'tight headband' on the top of your head. If there is no improvement within a week, please call neurology to schedule a follow-up appointment.  -TACHYCARDIA: You have been experiencing episodes of elevated heart rate, sometimes reaching up to 160 bpm with minimal activity. We will perform an EKG today to check your heart rhythm and order a 7-day heart monitor to look for any irregularities.  -OBSTRUCTIVE SLEEP APNEA: Your sleep apnea is currently untreated due to discomfort with the CPAP mask. Untreated sleep apnea can cause headaches and long-term heart issues. We will explore alternative mask styles or devices to improve your treatment.  -OBESITY: You are having difficulty losing weight despite diet and exercise. We discussed the possibility of bariatric surgery or weight loss injections. We can refer you to bariatric surgery for a consultation and consider weight loss injections from online pharmacies.  -ELEVATED LIVER ENZYMES: Your liver enzyme levels are elevated, likely due to fatty liver, but they are stable and unchanged. We will continue to monitor this.  INSTRUCTIONS:  Please follow up with neurology if your headache does not improve within a week. We will perform an EKG today and you will use a 7-day heart monitor to assess your heart rhythm. Consider alternative CPAP masks or devices for your sleep apnea. We can refer you to bariatric surgery for a consultation and consider weight loss injections. Continue monitoring your liver enzyme levels. Follow up in 6 months.

## 2023-05-31 NOTE — Assessment & Plan Note (Signed)
  Migraine has evolved into a persistent headache. I suspect OSA is a contributing factor for her headaches.   -Pt advised to call neurology for follow-up appointment.

## 2023-05-31 NOTE — Progress Notes (Signed)
 Subjective:     Patient ID: Margaret Golden, female    DOB: June 13, 1990, 33 y.o.   MRN: [id number]  Chief Complaint  Patient presents with   6 month follow up    Concerns/ questions: pt says she still has the HA and her HR has been around 140.     HPI  Discussed the use of AI scribe software for clinical note transcription with the patient, who gave verbal consent to proceed.  History of Present Illness Margaret Golden is a 33 year old female who presents with persistent headaches and elevated heart rate.  She has been experiencing a persistent headache for over a week, initially presenting as a migraine but now described as a 'tight headband' sensation on the top of her head. The headache has not improved despite its change in character and location.  She reports episodes of elevated heart rate that began approximately two weeks ago. Initially noticed during her bedtime routine, she experienced a heart rate of 110 bpm while lying down. Subsequent monitoring revealed heart rates exceeding 100 bpm, and at times over 160 bpm, with minimal activity such as standing or walking short distances. These episodes occur without exertion and are concerning to her.  She has a history of sleep apnea and has attempted CPAP therapy, but discontinued due to discomfort and physical injury from the mask. Despite improved breathing metrics, she did not feel rested while using CPAP. She is open to trying new mask styles if available.  She is aware of her insulin resistance and has attempted weight loss through diet and exercise without significant results. Weighing 383 pounds, she is concerned about her weight impacting her health, including sleep apnea and joint pain. She has considered weight loss medications but faces insurance coverage issues and is hesitant about bariatric surgery due to past negative experiences with anesthesia.  One of her liver tests remains elevated, likely due to fatty liver,  consistent with previous results.  Family History   Lab Results  Component Value Date   HGBA1C 4.3 (L) 11/26/2022   Lab Results  Component Value Date   TSH 4.86 02/26/2023      Health Maintenance Due  Topic Date Due   Cervical Cancer Screening (HPV/Pap Cotest)  05/18/2022   COVID-19 Vaccine (4 - 2024-25 season) 05/24/2023    Past Medical History:  Diagnosis Date   Acid reflux    ADD (attention deficit disorder)    Anemia    Anxiety    Arthritis    left ankle   Back pain    Chest pain    Complication of anesthesia    Depression    Dyspnea    with exertion   Exercise-induced asthma    as a child   Fatty liver    History of blood transfusion    History of chicken pox    History of kidney stones    passed    Hypothyroidism    Joint pain    Lower extremity edema    Major depressive disorder    Migraines    chronic   Palpitations    Pneumonia    x 2   PONV (postoperative nausea and vomiting)    nausea   RLS (restless legs syndrome)    Sleep apnea 2021   Thyroid disease    hypothyroid    Past Surgical History:  Procedure Laterality Date   BRONCHIAL NEEDLE ASPIRATION BIOPSY  03/31/2020   Procedure: BRONCHIAL NEEDLE ASPIRATION BIOPSIES;  Surgeon: Josephine Igo, DO;  Location: [facility] ENDOSCOPY;  Service: Pulmonary;;   BRONCHIAL WASHINGS  03/31/2020   Procedure: BRONCHIAL WASHINGS;  Surgeon: Josephine Igo, DO;  Location: [facility] ENDOSCOPY;  Service: Pulmonary;;   DILATATION & CURETTAGE/HYSTEROSCOPY WITH MYOSURE N/A 03/22/2020   Procedure: DILATATION & CURETTAGE/HYSTEROSCOPY WITH MYOSURE;  Surgeon: Romualdo Bolk, MD;  Location: [facility] OR;  Service: Gynecology;  Laterality: N/A;   INTERCOSTAL NERVE BLOCK Right 06/27/2020   Procedure: INTERCOSTAL NERVE BLOCK;  Surgeon: Corliss Skains, MD;  Location: [facility] OR;  Service: Thoracic;  Laterality: Right;   INTRAUTERINE DEVICE (IUD) INSERTION N/A 03/22/2020   Procedure: INTRAUTERINE DEVICE (IUD) INSERTION;   Surgeon: Romualdo Bolk, MD;  Location: [facility] OR;  Service: Gynecology;  Laterality: N/A;  Mirena IUD insertion   KNEE ARTHROSCOPY Right 2007   LYMPH NODE BIOPSY Right 06/27/2020   Procedure: LYMPH NODE BIOPSY;  Surgeon: Corliss Skains, MD;  Location: [facility] OR;  Service: Thoracic;  Laterality: Right;   TONSILLECTOMY  2010   TONSILLECTOMY     VIDEO BRONCHOSCOPY WITH ENDOBRONCHIAL ULTRASOUND N/A 03/31/2020   Procedure: VIDEO BRONCHOSCOPY WITH ENDOBRONCHIAL ULTRASOUND;  Surgeon: Josephine Igo, DO;  Location: [facility] ENDOSCOPY;  Service: Pulmonary;  Laterality: N/A;   WISDOM TOOTH EXTRACTION     WISDOM TOOTH EXTRACTION Bilateral    upper and lower    Family History  Problem Relation Age of Onset   Depression Mother    Obesity Mother        died from "an infection" ? MRSA   Depression Father    Sleep apnea Father    Obesity Father    Arthritis Maternal Grandmother    Breast cancer Maternal Grandmother        in remission   Diabetes Maternal Grandfather    Arthritis Maternal Grandfather    Arthritis Paternal Grandmother    Arthritis Paternal Grandfather    Cancer Paternal Aunt        colon   Cancer Paternal Uncle        brain cancer   Migraines Neg Hx     Social History   Socioeconomic History   Marital status: Single    Spouse name: Not on file   Number of children: 0   Years of education: 12   Highest education level: Not on file  Occupational History   Occupation: Conservation officer, historic buildings  Tobacco Use   Smoking status: Never    Passive exposure: Never   Smokeless tobacco: Never  Vaping Use   Vaping status: Never Used  Substance and Sexual Activity   Alcohol use: Not Currently    Comment: rare social drink maybe once "every 5 blue moons"   Drug use: No   Sexual activity: Never    Comment: never sexually active  Other Topics Concern   Not on file  Social History Narrative   Lives alone --update 11/11/19   Seeking work   Only child    Lives in a one story  house, has 2 cats      Caffeine: 2-4 cups/day   Social Drivers of Corporate investment banker Strain: Not on file  Food Insecurity: Not on file  Transportation Needs: Not on file  Physical Activity: Not on file  Stress: Not on file  Social Connections: Not on file  Intimate Partner Violence: Not on file    Outpatient Medications Prior to Visit  Medication Sig Dispense Refill   albuterol (VENTOLIN HFA) 108 (90 Base) MCG/ACT inhaler Inhale 2  puffs into the lungs every 6 (six) hours as needed for wheezing or shortness of breath. 8 g 2   aspirin-acetaminophen-caffeine (EXCEDRIN MIGRAINE) 250-250-65 MG tablet Take 2 tablets by mouth daily as needed for headache or migraine.     clotrimazole-betamethasone (LOTRISONE) cream Apply topically.     diphenhydrAMINE HCl, Sleep, (ZZZQUIL PO) Take by mouth at bedtime as needed.     folic acid (FOLVITE) 1 MG tablet TAKE 2 TABLETS BY MOUTH DAILY 60 tablet 3   furosemide (LASIX) 20 MG tablet TAKE 1 TABLET BY MOUTH TWO TIMES A WEEK 26 tablet 5   gabapentin (NEURONTIN) 300 MG capsule Take 1 capsule (300 mg total) by mouth 2 (two) times daily. 180 capsule 1   Iron-FA-B Cmp-C-Biot-Probiotic (FUSION PLUS) CAPS One capsule twice daily with orange juice. Stop your Ferrous sulfate. 30 capsule 1   levonorgestrel (MIRENA, 52 MG,) 20 MCG/DAY IUD 1 each by Intrauterine route once.     levothyroxine (SYNTHROID) 200 MCG tablet TAKE 1 TABLET BY MOUTH DAILY 90 tablet 0   LORazepam (ATIVAN) 0.5 MG tablet Take 1 tablet (0.5 mg total) by mouth every 8 (eight) hours as needed for anxiety. 30 tablet 4   lurasidone (LATUDA) 40 MG TABS tablet Take 1 tablet (40 mg total) by mouth daily with supper. 90 tablet 1   meloxicam (MOBIC) 7.5 MG tablet Take 1 tablet (7.5 mg total) by mouth daily. 14 tablet 0   methocarbamol (ROBAXIN) 500 MG tablet Take 1 tablet (500 mg total) by mouth every 8 (eight) hours as needed for muscle spasms. 20 tablet 0   methotrexate (RHEUMATREX) 2.5 MG  tablet TAKE 6 TABLETS BY MOUTH ONCE A WEEK (PROTECT MEDICINE FROM LIGHT) 24 tablet 5   methylphenidate (RITALIN) 10 MG tablet Take 1/2-1 tablet daily as needed 30 tablet 0   Multiple Vitamins-Minerals (MULTIVITAMIN WITH MINERALS) tablet Take 1 tablet by mouth daily.     nystatin (MYCOSTATIN/NYSTOP) powder Apply 1 Application topically 2 (two) times daily as needed. 60 g 1   omeprazole (PRILOSEC) 20 MG capsule Take 2 capsules (40 mg total) by mouth daily. 180 capsule 1   ondansetron (ZOFRAN) 4 MG tablet Take 1 tablet (4 mg total) by mouth every 8 (eight) hours as needed for nausea or vomiting. 20 tablet 0   ondansetron (ZOFRAN-ODT) 4 MG disintegrating tablet Take 1 tablet (4 mg total) by mouth every 8 (eight) hours as needed for nausea or vomiting. 20 tablet 0   potassium chloride SA (KLOR-CON) 20 MEQ tablet Take 1 tablet (20 mEq total) by mouth 2 (two) times a week. 26 tablet 0   propranolol (INDERAL) 20 MG tablet TAKE 1 TABLET (20 MG TOTAL) BY MOUTH 3 (THREE) TIMES DAILY. 270 tablet 3   rizatriptan (MAXALT) 10 MG tablet Take 1 tablet (10 mg total) by mouth as needed for migraine. May repeat in 2 hours if needed 10 tablet 0   sertraline (ZOLOFT) 100 MG tablet Take 2 tablets (200 mg total) by mouth daily. 180 tablet 1   topiramate (TOPAMAX) 50 MG tablet Start with 50mg (1 tab) at bedtime. In 1 week increase to 100mg (2 tabs) at bedtime. 180 tablet 0   Vitamin D, Ergocalciferol, (DRISDOL) 1.25 MG (50000 UNIT) CAPS capsule Take 1 capsule (50,000 Units total) by mouth 2 (two) times a week. 12 capsule 0   No facility-administered medications prior to visit.    Allergies  Allergen Reactions   Augmentin [Amoxicillin-Pot Clavulanate] Hives    ROS See HPI    Objective:  Physical Exam Constitutional:      General: She is not in acute distress.    Appearance: Normal appearance. She is well-developed.  HENT:     Head: Normocephalic and atraumatic.     Right Ear: External ear normal.     Left  Ear: External ear normal.  Eyes:     General: No scleral icterus. Neck:     Thyroid: No thyromegaly.  Cardiovascular:     Rate and Rhythm: Normal rate and regular rhythm.     Heart sounds: Normal heart sounds. No murmur heard. Pulmonary:     Effort: Pulmonary effort is normal. No respiratory distress.     Breath sounds: Normal breath sounds. No wheezing.  Musculoskeletal:     Cervical back: Neck supple.  Skin:    General: Skin is warm and dry.  Neurological:     Mental Status: She is alert and oriented to person, place, and time.  Psychiatric:        Mood and Affect: Mood normal.        Behavior: Behavior normal.        Thought Content: Thought content normal.        Judgment: Judgment normal.      BP 114/65   Pulse 80   Temp 98 F (36.7 C) (Temporal)   Resp 18   Ht 5\' 7"  (1.702 m)   Wt (!) 383 lb (173.7 kg)   SpO2 98%   BMI 59.99 kg/m  Wt Readings from Last 3 Encounters:  05/31/23 (!) 383 lb (173.7 kg)  05/24/23 285 lb (129.3 kg)  05/10/23 279 lb (126.6 kg)       Assessment & Plan:   Problem List Items Addressed This Visit       Unprioritized   Tachycardia - Primary   New.  EKG tracing is personally reviewed.  EKG notes NSR.  No acute changes.   Reports of elevated heart rate with minimal activity, reaching up to 160 bpm. No associated chest pain. Patient suspects POTS. -Perform EKG today to assess rhythm. -Order 7-day heart monitor to assess for arrhythmias.       Relevant Orders   LONG TERM MONITOR (3-14 DAYS)   EKG 12-Lead (Completed)   OSA (obstructive sleep apnea)    Patient has been diagnosed but is non-compliant with CPAP due to discomfort and lack of perceived benefit. Discussed potential for headaches and long-term cardiac risks with untreated sleep apnea. -Consider alternative mask styles or devices for sleep apnea treatment.      Obesity with alveolar hypoventilation and body mass index (BMI) of 40 or greater (HCC)    Patient has  difficulty losing weight despite diet changes and exercise. Discussed potential for bariatric surgery or weight loss injections, but patient is hesitant due to concerns about anesthesia and desire for reversible treatment. -Consider referral to bariatric surgery for consultation.      Migraines    Migraine has evolved into a persistent headache. I suspect OSA is a contributing factor for her headaches.   -Pt advised to call neurology for follow-up appointment.      Abnormal LFTs    Likely due to fatty liver. Stable and unchanged. -Continue monitoring.       I am having [name] A. Bougher maintain her multivitamin with minerals, Fusion Plus, potassium chloride SA, aspirin-acetaminophen-caffeine, furosemide, Mirena (52 MG), (diphenhydrAMINE HCl, Sleep, (ZZZQUIL PO)), Vitamin D (Ergocalciferol), albuterol, ondansetron, meloxicam, propranolol, nystatin, omeprazole, clotrimazole-betamethasone, methotrexate, gabapentin, LORazepam, lurasidone, methylphenidate, sertraline, folic acid, levothyroxine, rizatriptan,  ondansetron, methocarbamol, and topiramate.  No orders of the defined types were placed in this encounter.

## 2023-05-31 NOTE — Assessment & Plan Note (Signed)
 New.  EKG tracing is personally reviewed.  EKG notes NSR.  No acute changes.   Reports of elevated heart rate with minimal activity, reaching up to 160 bpm. No associated chest pain. Patient suspects POTS. -Perform EKG today to assess rhythm. -Order 7-day heart monitor to assess for arrhythmias.

## 2023-05-31 NOTE — Assessment & Plan Note (Signed)
  Patient has difficulty losing weight despite diet changes and exercise. Discussed potential for bariatric surgery or weight loss injections, but patient is hesitant due to concerns about anesthesia and desire for reversible treatment. -Consider referral to bariatric surgery for consultation.

## 2023-06-03 DIAGNOSIS — F4323 Adjustment disorder with mixed anxiety and depressed mood: Secondary | ICD-10-CM | POA: Diagnosis not present

## 2023-06-04 DIAGNOSIS — R000 Tachycardia, unspecified: Secondary | ICD-10-CM

## 2023-06-10 DIAGNOSIS — F4323 Adjustment disorder with mixed anxiety and depressed mood: Secondary | ICD-10-CM | POA: Diagnosis not present

## 2023-06-17 ENCOUNTER — Encounter: Payer: Self-pay | Admitting: Pulmonary Disease

## 2023-06-17 DIAGNOSIS — F4323 Adjustment disorder with mixed anxiety and depressed mood: Secondary | ICD-10-CM | POA: Diagnosis not present

## 2023-06-21 DIAGNOSIS — R000 Tachycardia, unspecified: Secondary | ICD-10-CM | POA: Diagnosis not present

## 2023-06-24 DIAGNOSIS — F4323 Adjustment disorder with mixed anxiety and depressed mood: Secondary | ICD-10-CM | POA: Diagnosis not present

## 2023-07-02 DIAGNOSIS — F4323 Adjustment disorder with mixed anxiety and depressed mood: Secondary | ICD-10-CM | POA: Diagnosis not present

## 2023-07-06 ENCOUNTER — Other Ambulatory Visit: Payer: Self-pay | Admitting: Obstetrics and Gynecology

## 2023-07-08 DIAGNOSIS — F4323 Adjustment disorder with mixed anxiety and depressed mood: Secondary | ICD-10-CM | POA: Diagnosis not present

## 2023-07-09 NOTE — Telephone Encounter (Signed)
 Med refill request: Lotrisone Cream Last AEX: 01/01/2023-GH Next AEX: nothing currently, recall placed for 2025 Last MMG (if hormonal med): n/a Refill authorized: rx pend.  Rx'd in past for candidal intertrigo.

## 2023-07-10 ENCOUNTER — Other Ambulatory Visit: Payer: Self-pay | Admitting: Family

## 2023-07-11 ENCOUNTER — Other Ambulatory Visit: Payer: Self-pay | Admitting: Family

## 2023-07-11 DIAGNOSIS — E039 Hypothyroidism, unspecified: Secondary | ICD-10-CM

## 2023-07-15 DIAGNOSIS — F4323 Adjustment disorder with mixed anxiety and depressed mood: Secondary | ICD-10-CM | POA: Diagnosis not present

## 2023-07-22 DIAGNOSIS — F4323 Adjustment disorder with mixed anxiety and depressed mood: Secondary | ICD-10-CM | POA: Diagnosis not present

## 2023-07-23 ENCOUNTER — Ambulatory Visit (HOSPITAL_COMMUNITY)
Admission: RE | Admit: 2023-07-23 | Discharge: 2023-07-23 | Disposition: A | Discharge status: Home / Self Care | Source: Ambulatory Visit | Attending: Pulmonary Disease | Admitting: Pulmonary Disease

## 2023-07-23 DIAGNOSIS — R918 Other nonspecific abnormal finding of lung field: Secondary | ICD-10-CM | POA: Diagnosis not present

## 2023-07-23 DIAGNOSIS — R590 Localized enlarged lymph nodes: Secondary | ICD-10-CM | POA: Diagnosis not present

## 2023-07-23 DIAGNOSIS — D869 Sarcoidosis, unspecified: Secondary | ICD-10-CM | POA: Insufficient documentation

## 2023-07-30 DIAGNOSIS — F4323 Adjustment disorder with mixed anxiety and depressed mood: Secondary | ICD-10-CM | POA: Diagnosis not present

## 2023-08-07 ENCOUNTER — Encounter: Payer: Self-pay | Admitting: Pulmonary Disease

## 2023-08-07 ENCOUNTER — Ambulatory Visit: Payer: BC Managed Care – PPO | Admitting: Pulmonary Disease

## 2023-08-07 VITALS — BP 123/85 | HR 85 | Ht 67.0 in | Wt 380.0 lb

## 2023-08-07 DIAGNOSIS — G4733 Obstructive sleep apnea (adult) (pediatric): Secondary | ICD-10-CM | POA: Diagnosis not present

## 2023-08-07 DIAGNOSIS — D869 Sarcoidosis, unspecified: Secondary | ICD-10-CM | POA: Diagnosis not present

## 2023-08-07 NOTE — Progress Notes (Signed)
 Synopsis: Referred in 12/2019 by Dorrene Gaucher, NP for mediastinal adenopathy  Subjective:   PATIENT ID: Margaret Golden DOB: July 24, 1990, MRN: [id number]  HPI  Chief Complaint  Patient presents with   Follow-up    Pt states chest tightness, still SOB, "aware of breathing "   Margaret Golden is a 33 year old woman, non-smoker with history of anemia, hypertension and obesity who returns to pulmonary clinic for follow up of sarcoidosis.    She experiences persistent breathing difficulties and chest discomfort, described as feeling like she has 'slammed against a brick wall'. CT Chest scan shows significant improvement in mediastinal adenopathy. She takes methotrexate , six tablets weekly, and has been on this medication for a year.   She is not using CPAP for sleep apnea due to previous facial injuries but is considering revisiting this option.  OV 05/09/23 She has been doing well since last visit. No major changes in her dyspnea and chest discomfort with exertion. She has noted reduced side effects after taking methotrexate  with taking 2mg  of folic acid  daily.    OV 08/31/22 She was seen by Irby Mannan 10/11/21 and 07/31/22 for follow up. She was ordered for split night sleep study at last visit. She has been resumed on methotrexate  after missing her doses for 4-5 weeks. She continues to have sluggish feeling and brain fog after taking her weekly doses. Otherwise she is doing ok.  OV 05/2021 She continues on 15mg  methotrexate  weekly since last fall. She denies any improvement in her chest pains or dyspnea. No updated lab work since 01/2021. She has gained 14lbs since last visit. She complains of feeling sluggish and having brain fog for 2-3 days after taking her weekly dose of methotrexate .   She is not using CPAP.   OV 08/2020 She was started on methotrexate  5mg  weekly at last visit. She has tolerated the medication well thus far. She does not notice any change in her  dyspnea on exertion. She continues to have intermittent chest pains.   Pulmonary function tests are normal overall but they show increased diffusion capacity.   OV 07/26/20 She developed chest discomfort and shortness of breath about 1 year ago. She was hospitalized for symptomatic anemia due to heavy menses. Her hemoglobin has remained in the normal range since but she continued to have the above symptoms. She was evaluated by Cardiology prior to hysteroscopy who performed an ECHO which was normal and a CTA coronary scan which showed mediastinal adenopathy and pulmonary nodules.   We have followed her thoracic adenopathy with serial CT chest scans which demonstrated progression of the adenopathy prompting a PET scan on 04/27/20 which demonstrated increased uptake in her lymph nodes and also the right upper lobe nodule. She was taken for EBUS on 03/31/20 with sampling of the station 7 lymph node with non-diagnostic results. She was then referred to Thoracic Surgery who performed mediastinoscopy on 06/27/20 with lymph node samples  with non-caseating granulomas consistent with sarcoidosis.     She denies any vision changes, joint pains or skin rashes. She continues to have exertional dyspnea, chest pressure and has started to wake up short of breath at night at times.   No family members with sarcoidosis.   Past Medical History:  Diagnosis Date   Acid reflux    ADD (attention deficit disorder)    Anemia    Anxiety    Arthritis    left ankle   Back pain    Chest pain  Complication of anesthesia    Depression    Dyspnea    with exertion   Exercise-induced asthma    as a child   Fatty liver    History of blood transfusion    History of chicken pox    History of kidney stones    passed    Hypothyroidism    Joint pain    Lower extremity edema    Major depressive disorder    Migraines    chronic   Palpitations    Pneumonia    x 2   PONV (postoperative nausea and vomiting)     nausea   RLS (restless legs syndrome)    Sleep apnea 2021   Thyroid  disease    hypothyroid     Family History  Problem Relation Age of Onset   Depression Mother    Obesity Mother        died from "an infection" ? MRSA   Depression Father    Sleep apnea Father    Obesity Father    Arthritis Maternal Grandmother    Breast cancer Maternal Grandmother        in remission   Diabetes Maternal Grandfather    Arthritis Maternal Grandfather    Arthritis Paternal Grandmother    Arthritis Paternal Grandfather    Cancer Paternal Aunt        colon   Cancer Paternal Uncle        brain cancer   Migraines Neg Hx      Social History   Socioeconomic History   Marital status: Single    Spouse name: Not on file   Number of children: 0   Years of education: 12   Highest education level: Not on file  Occupational History   Occupation: Conservation officer, historic buildings  Tobacco Use   Smoking status: Never    Passive exposure: Never   Smokeless tobacco: Never  Vaping Use   Vaping status: Never Used  Substance and Sexual Activity   Alcohol use: Not Currently    Comment: rare social drink maybe once "every 5 blue moons"   Drug use: No   Sexual activity: Never    Comment: never sexually active  Other Topics Concern   Not on file  Social History Narrative   Lives alone --update 11/11/19   Seeking work   Only child    Lives in a one story house, has 2 cats      Caffeine : 2-4 cups/day   Social Drivers of Corporate investment banker Strain: Not on file  Food Insecurity: Not on file  Transportation Needs: Not on file  Physical Activity: Not on file  Stress: Not on file  Social Connections: Not on file  Intimate Partner Violence: Not on file     Allergies  Allergen Reactions   Augmentin [Amoxicillin -Pot Clavulanate] Hives     Outpatient Medications Prior to Visit  Medication Sig Dispense Refill   albuterol  (VENTOLIN  HFA) 108 (90 Base) MCG/ACT inhaler Inhale 2 puffs into the lungs  every 6 (six) hours as needed for wheezing or shortness of breath. 8 g 2   aspirin -acetaminophen -caffeine  (EXCEDRIN  MIGRAINE) 250-250-65 MG tablet Take 2 tablets by mouth daily as needed for headache or migraine.     clotrimazole -betamethasone  (LOTRISONE ) cream APPLY TOPICALLY TWICE DAILY TO ABDOMINAL FOLD FOR 14 DAYS 45 g 0   diphenhydrAMINE  HCl, Sleep, (ZZZQUIL PO) Take by mouth at bedtime as needed.     folic acid  (FOLVITE ) 1 MG tablet TAKE 2 TABLETS  BY MOUTH DAILY 60 tablet 3   furosemide  (LASIX ) 20 MG tablet TAKE 1 TABLET BY MOUTH TWO TIMES A WEEK 26 tablet 5   Iron -FA-B Cmp-C-Biot-Probiotic (FUSION PLUS) CAPS One capsule twice daily with orange juice. Stop your Ferrous sulfate . 30 capsule 1   levonorgestrel  (MIRENA , 52 MG,) 20 MCG/DAY IUD 1 each by Intrauterine route once.     levothyroxine  (SYNTHROID ) 200 MCG tablet Take 1 tablet (200 mcg total) by mouth daily before breakfast. 90 tablet 0   LORazepam  (ATIVAN ) 0.5 MG tablet Take 1 tablet (0.5 mg total) by mouth every 8 (eight) hours as needed for anxiety. 30 tablet 4   lurasidone  (LATUDA ) 40 MG TABS tablet Take 1 tablet (40 mg total) by mouth daily with supper. 90 tablet 1   meloxicam  (MOBIC ) 7.5 MG tablet Take 1 tablet (7.5 mg total) by mouth daily. 14 tablet 0   methocarbamol  (ROBAXIN ) 500 MG tablet Take 1 tablet (500 mg total) by mouth every 8 (eight) hours as needed for muscle spasms. 20 tablet 0   methotrexate  (RHEUMATREX) 2.5 MG tablet TAKE 6 TABLETS BY MOUTH ONCE A WEEK (PROTECT MEDICINE FROM LIGHT) 24 tablet 5   methylphenidate  (RITALIN ) 10 MG tablet Take 1/2-1 tablet daily as needed 30 tablet 0   Multiple Vitamins-Minerals (MULTIVITAMIN WITH MINERALS) tablet Take 1 tablet by mouth daily.     nystatin  (MYCOSTATIN /NYSTOP ) powder Apply 1 Application topically 2 (two) times daily as needed. 60 g 1   omeprazole  (PRILOSEC) 20 MG capsule TAKE 2 CAPSULES BY MOUTH DAILY 180 capsule 1   ondansetron  (ZOFRAN ) 4 MG tablet Take 1 tablet (4 mg  total) by mouth every 8 (eight) hours as needed for nausea or vomiting. 20 tablet 0   ondansetron  (ZOFRAN -ODT) 4 MG disintegrating tablet Take 1 tablet (4 mg total) by mouth every 8 (eight) hours as needed for nausea or vomiting. 20 tablet 0   potassium chloride  SA (KLOR-CON ) 20 MEQ tablet Take 1 tablet (20 mEq total) by mouth 2 (two) times a week. 26 tablet 0   propranolol  (INDERAL ) 20 MG tablet TAKE 1 TABLET (20 MG TOTAL) BY MOUTH 3 (THREE) TIMES DAILY. 270 tablet 3   rizatriptan  (MAXALT ) 10 MG tablet Take 1 tablet (10 mg total) by mouth as needed for migraine. May repeat in 2 hours if needed 10 tablet 0   sertraline  (ZOLOFT ) 100 MG tablet Take 2 tablets (200 mg total) by mouth daily. 180 tablet 1   topiramate  (TOPAMAX ) 50 MG tablet Start with 50mg (1 tab) at bedtime. In 1 week increase to 100mg (2 tabs) at bedtime. 180 tablet 0   Vitamin D , Ergocalciferol , (DRISDOL ) 1.25 MG (50000 UNIT) CAPS capsule Take 1 capsule (50,000 Units total) by mouth 2 (two) times a week. 12 capsule 0   gabapentin  (NEURONTIN ) 300 MG capsule Take 1 capsule (300 mg total) by mouth 2 (two) times daily. 180 capsule 1   No facility-administered medications prior to visit.    Review of Systems  Constitutional:  Positive for malaise/fatigue. Negative for chills, fever and weight loss.  HENT:  Negative for congestion and sore throat.   Respiratory:  Positive for shortness of breath. Negative for cough, hemoptysis, sputum production and wheezing.   Cardiovascular:  Positive for chest pain. Negative for palpitations, orthopnea, claudication, leg swelling and PND.  Gastrointestinal:  Negative for abdominal pain, heartburn and vomiting.  Genitourinary: Negative.   Musculoskeletal:  Negative for joint pain and myalgias.  Neurological:  Negative for dizziness, focal weakness and headaches.  Endo/Heme/Allergies: Negative.  Psychiatric/Behavioral: Negative.      Objective:   Vitals:   08/07/23 1558  BP: 123/85  Pulse: 85   SpO2: 98%  Weight: (!) 380 lb (172.4 kg)  Height: 5\' 7"  (1.702 m)    Physical Exam Constitutional:      General: She is not in acute distress.    Appearance: She is obese. She is not ill-appearing.  HENT:     Head: Normocephalic and atraumatic.  Eyes:     Conjunctiva/sclera: Conjunctivae normal.  Cardiovascular:     Rate and Rhythm: Normal rate and regular rhythm.     Pulses: Normal pulses.     Heart sounds: Normal heart sounds. No murmur heard. Pulmonary:     Effort: Pulmonary effort is normal.     Breath sounds: No decreased breath sounds, wheezing, rhonchi or rales.  Musculoskeletal:     Right lower leg: No edema.     Left lower leg: No edema.  Skin:    General: Skin is warm and dry.  Neurological:     Mental Status: She is alert.    CBC    Component Value Date/Time   WBC 8.9 05/09/2023 1627   RBC 4.69 05/09/2023 1627   HGB 14.4 05/09/2023 1627   HGB 14.6 02/13/2023 1241   HGB 9.3 (L) 08/26/2019 1045   HCT 43.2 05/09/2023 1627   HCT 33.4 (L) 08/26/2019 1045   PLT 161.0 05/09/2023 1627   PLT 133 (L) 02/13/2023 1241   PLT 234 08/26/2019 1045   MCV 92.0 05/09/2023 1627   MCV 72 (L) 08/26/2019 1045   MCH 30.1 02/13/2023 1241   MCHC 33.3 05/09/2023 1627   RDW 16.2 (H) 05/09/2023 1627   RDW 15.4 08/26/2019 1045   LYMPHSABS 1.3 05/09/2023 1627   MONOABS 0.2 05/09/2023 1627   EOSABS 0.1 05/09/2023 1627   BASOSABS 0.1 05/09/2023 1627      Latest Ref Rng & Units 05/09/2023    4:27 PM 01/08/2023    1:05 PM 07/31/2022    3:01 PM  BMP  Glucose 70 - 99 mg/dL 85  981  191   BUN 6 - 23 mg/dL 8  11  9    Creatinine 0.40 - 1.20 mg/dL 4.78  2.95  6.21   Sodium 135 - 145 mEq/L 138  140  136   Potassium 3.5 - 5.1 mEq/L 4.4  3.7  3.9   Chloride 96 - 112 mEq/L 103  103  101   CO2 19 - 32 mEq/L 25  27  25    Calcium 8.4 - 10.5 mg/dL 8.9  8.9  9.4    Chest imaging: CT Chest 07/23/23 1. Normal examination of the chest. Resolution previously noted. Lymphatic nodules and  mediastinal adenopathy on remote prior examination of 03/14/2020. 2. Stable moderate splenomegaly. 3. Bilateral nonobstructing nephrolithiasis.  CXR 07/31/22 The heart size and mediastinal contours are within normal limits. Both lungs are clear. The visualized skeletal structures are unremarkable.  CT Chest 07/03/21 1. Significant improvement in the mediastinal lymphadenopathy seen previously, with index lesions described above. No pathologic adenopathy identified on this exam. Please note limitations for evaluation of hilar adenopathy without IV contrast. 2. Interval resolution of the bilateral pulmonary nodules. No new nodules or masses. 3. Stable splenomegaly, only partially visualized on this study due to slice selection. 4. Small hiatal hernia.  PET Scan 04/27/20 Multifocal hypermetabolic nodal activity throughout the mediastinum, hilar regions and porta hepatis. Multiple nonspecific small pulmonary nodules are again noted bilaterally, stable from recent studies,  but also new from baseline exam. The largest nodule in the right upper lobe is hypermetabolic  CT Chest w contrast 16/10/96 Mild interval progression of thoracic adenopathy. Numerous pulmonary nodules that remain stable  CTA 12/17/19 Mediastinal and hilar adenopathy similar to scan on 11/23/19 Stable scattered pulmonary nodules  CTA Coronary 11/23/19 Enlarged subcarinal node. Bilateral hilar and infrahilar boderlinr to mild adenopathy. Bilateral pulmonary nodules, subpleural distribution.   CTA Chest 06/29/19 Mediastinum/Nodes: No enlarged mediastinal, hilar, or axillary lymph nodes. Thyroid  gland, trachea, and esophagus demonstrate no significant findings. Lungs/Pleura: No acute pleural or parenchymal lung disease. Central airways are widely patent.  PFT:    Latest Ref Rng & Units 09/27/2020   12:58 PM  PFT Results  FVC-Pre L 4.60   FVC-Predicted Pre % 110   FVC-Post L 4.45   FVC-Predicted Post % 107   Pre  FEV1/FVC % % 78   Post FEV1/FCV % % 79   FEV1-Pre L 3.60   FEV1-Predicted Pre % 103   FEV1-Post L 3.54   DLCO uncorrected ml/min/mmHg 30.14   DLCO UNC% % 121   DLCO corrected ml/min/mmHg 29.61   DLCO COR %Predicted % 119   DLVA Predicted % 109   PFT 2022: Normal Spirometry and lung volumes, increased diffusion  Labs: 07/26/20 - Hep B & C negative  Path: 06/27/20 - Surgical Path: LN 7, 10R show well formed granulomas concerning for sarcoidosis. Flow pathology negative.  Echo 09/14/19: EF 60-65%, Normal LV function. RV systolic function is normal. No valvular abnormalities.   Sleep Study: 01/2016 - AHI 3.5/hr -No central sleep apneas - Minimal to no oxygen desaturations    Assessment & Plan:   OSA (obstructive sleep apnea) - Plan: Cpap titration  Sarcoidosis - Plan: AMB referral to pulmonary rehabilitation  Discussion: Margaret Golden is a 33 year old woman, non-smoker with history of anemia, hypertension and obesity who returns to pulmonary clinic for chest discomfort, shortness of breath and mediastinal lymphadenopathy found to have sarcoidosis via mediastinoscopy on 06/27/20.   She has had good response based on CT scans with resolution of her mediastinal adenopathy while on methotrexate  therapy. She has been treated for over 12 months.   Will taper off methotrexate  and repeat a CT chest scan in 6-12 months for monitoring purposes. Difficult to go by clinic symptoms as these have not improved much during her treatment course. On going dyspnea likely related to obesity and deconditioning.   We will schedule her for CPAP titration study for her history of sleep apnea. We will refer her to pulmonary rehab.  Follow up in 3 months.   Duaine German, MD [facility] Office: [phone]   Current Outpatient Medications:    albuterol  (VENTOLIN  HFA) 108 (90 Base) MCG/ACT inhaler, Inhale 2 puffs into the lungs every 6 (six) hours as needed for wheezing or  shortness of breath., Disp: 8 g, Rfl: 2   aspirin -acetaminophen -caffeine  (EXCEDRIN  MIGRAINE) 250-250-65 MG tablet, Take 2 tablets by mouth daily as needed for headache or migraine., Disp: , Rfl:    clotrimazole -betamethasone  (LOTRISONE ) cream, APPLY TOPICALLY TWICE DAILY TO ABDOMINAL FOLD FOR 14 DAYS, Disp: 45 g, Rfl: 0   diphenhydrAMINE  HCl, Sleep, (ZZZQUIL PO), Take by mouth at bedtime as needed., Disp: , Rfl:    folic acid  (FOLVITE ) 1 MG tablet, TAKE 2 TABLETS BY MOUTH DAILY, Disp: 60 tablet, Rfl: 3   furosemide  (LASIX ) 20 MG tablet, TAKE 1 TABLET BY MOUTH TWO TIMES A WEEK, Disp: 26 tablet, Rfl: 5  Iron -FA-B Cmp-C-Biot-Probiotic (FUSION PLUS) CAPS, One capsule twice daily with orange juice. Stop your Ferrous sulfate ., Disp: 30 capsule, Rfl: 1   levonorgestrel  (MIRENA , 52 MG,) 20 MCG/DAY IUD, 1 each by Intrauterine route once., Disp: , Rfl:    levothyroxine  (SYNTHROID ) 200 MCG tablet, Take 1 tablet (200 mcg total) by mouth daily before breakfast., Disp: 90 tablet, Rfl: 0   LORazepam  (ATIVAN ) 0.5 MG tablet, Take 1 tablet (0.5 mg total) by mouth every 8 (eight) hours as needed for anxiety., Disp: 30 tablet, Rfl: 4   lurasidone  (LATUDA ) 40 MG TABS tablet, Take 1 tablet (40 mg total) by mouth daily with supper., Disp: 90 tablet, Rfl: 1   meloxicam  (MOBIC ) 7.5 MG tablet, Take 1 tablet (7.5 mg total) by mouth daily., Disp: 14 tablet, Rfl: 0   methocarbamol  (ROBAXIN ) 500 MG tablet, Take 1 tablet (500 mg total) by mouth every 8 (eight) hours as needed for muscle spasms., Disp: 20 tablet, Rfl: 0   methotrexate  (RHEUMATREX) 2.5 MG tablet, TAKE 6 TABLETS BY MOUTH ONCE A WEEK (PROTECT MEDICINE FROM LIGHT), Disp: 24 tablet, Rfl: 5   methylphenidate  (RITALIN ) 10 MG tablet, Take 1/2-1 tablet daily as needed, Disp: 30 tablet, Rfl: 0   Multiple Vitamins-Minerals (MULTIVITAMIN WITH MINERALS) tablet, Take 1 tablet by mouth daily., Disp: , Rfl:    nystatin  (MYCOSTATIN /NYSTOP ) powder, Apply 1 Application topically 2  (two) times daily as needed., Disp: 60 g, Rfl: 1   omeprazole  (PRILOSEC) 20 MG capsule, TAKE 2 CAPSULES BY MOUTH DAILY, Disp: 180 capsule, Rfl: 1   ondansetron  (ZOFRAN ) 4 MG tablet, Take 1 tablet (4 mg total) by mouth every 8 (eight) hours as needed for nausea or vomiting., Disp: 20 tablet, Rfl: 0   ondansetron  (ZOFRAN -ODT) 4 MG disintegrating tablet, Take 1 tablet (4 mg total) by mouth every 8 (eight) hours as needed for nausea or vomiting., Disp: 20 tablet, Rfl: 0   potassium chloride  SA (KLOR-CON ) 20 MEQ tablet, Take 1 tablet (20 mEq total) by mouth 2 (two) times a week., Disp: 26 tablet, Rfl: 0   propranolol  (INDERAL ) 20 MG tablet, TAKE 1 TABLET (20 MG TOTAL) BY MOUTH 3 (THREE) TIMES DAILY., Disp: 270 tablet, Rfl: 3   rizatriptan  (MAXALT ) 10 MG tablet, Take 1 tablet (10 mg total) by mouth as needed for migraine. May repeat in 2 hours if needed, Disp: 10 tablet, Rfl: 0   sertraline  (ZOLOFT ) 100 MG tablet, Take 2 tablets (200 mg total) by mouth daily., Disp: 180 tablet, Rfl: 1   topiramate  (TOPAMAX ) 50 MG tablet, Start with 50mg (1 tab) at bedtime. In 1 week increase to 100mg (2 tabs) at bedtime., Disp: 180 tablet, Rfl: 0   Vitamin D , Ergocalciferol , (DRISDOL ) 1.25 MG (50000 UNIT) CAPS capsule, Take 1 capsule (50,000 Units total) by mouth 2 (two) times a week., Disp: 12 capsule, Rfl: 0   gabapentin  (NEURONTIN ) 300 MG capsule, Take 1 capsule (300 mg total) by mouth 2 (two) times daily., Disp: 180 capsule, Rfl: 1

## 2023-08-07 NOTE — Patient Instructions (Addendum)
 Taper to 4 tabs weekly on 5/12 and 5/19  Taper to 2 tabs weekly on 5/26 and 6/2  Then 1 tab weekly on 6/9 and 6/16  Then off  Your CT scan looks much improved  We will schedule you for a CPAP or bipap titration study in the sleep lab  We will refer you to pulmonary rehab  Follow up in 3 months

## 2023-08-08 ENCOUNTER — Telehealth (HOSPITAL_COMMUNITY): Payer: Self-pay

## 2023-08-08 ENCOUNTER — Other Ambulatory Visit: Payer: Self-pay | Admitting: Pulmonary Disease

## 2023-08-08 ENCOUNTER — Other Ambulatory Visit: Payer: Self-pay | Admitting: Primary Care

## 2023-08-08 DIAGNOSIS — D869 Sarcoidosis, unspecified: Secondary | ICD-10-CM

## 2023-08-08 NOTE — Telephone Encounter (Signed)
 Pt insurance is active and benefits verified through [employer] Co-pay 0, DED 0/0 met, out of pocket $1,150/$630.78 met, co-insurance 40%. no pre-authorization required, [employer] 08/08/2023@11 :22, REF# [id number] No limit

## 2023-08-09 ENCOUNTER — Telehealth (HOSPITAL_COMMUNITY): Payer: Self-pay

## 2023-08-09 ENCOUNTER — Encounter (HOSPITAL_COMMUNITY): Payer: Self-pay

## 2023-08-09 NOTE — Telephone Encounter (Signed)
 Received referral from Dr. Diania Fortes for this pt to participate in Pulmonary Rehab with the diagnosis of sarcoidosis. Clinical review of pt follow up appt on 06/07/23 Pulmonary office note. Pt appropriate for scheduling for Pulmonary rehab. Will forward to support staff for scheduling and verification of insurance eligibility/benefits with pt consent.   [facility]

## 2023-08-09 NOTE — Progress Notes (Signed)
 Called patient to see if she was interested in participating in the Pulmonary Rehab Program. Patient will come in for orientation on 08/23/23 @ 1:00 and will attend the 1:15 exercise class.

## 2023-08-11 ENCOUNTER — Encounter: Payer: Self-pay | Admitting: Pulmonary Disease

## 2023-08-12 DIAGNOSIS — F4323 Adjustment disorder with mixed anxiety and depressed mood: Secondary | ICD-10-CM | POA: Diagnosis not present

## 2023-08-18 ENCOUNTER — Other Ambulatory Visit: Payer: Self-pay | Admitting: Family

## 2023-08-18 DIAGNOSIS — G43809 Other migraine, not intractable, without status migrainosus: Secondary | ICD-10-CM

## 2023-08-20 DIAGNOSIS — F4323 Adjustment disorder with mixed anxiety and depressed mood: Secondary | ICD-10-CM | POA: Diagnosis not present

## 2023-08-22 ENCOUNTER — Telehealth (HOSPITAL_COMMUNITY): Payer: Self-pay

## 2023-08-22 NOTE — Telephone Encounter (Signed)
 Confirmed appt for Pulm Rehab tomorrow at 1pm

## 2023-08-23 ENCOUNTER — Encounter (HOSPITAL_COMMUNITY)
Admission: RE | Admit: 2023-08-23 | Discharge: 2023-08-23 | Disposition: A | Discharge status: Home / Self Care | Source: Ambulatory Visit | Attending: Pulmonary Disease | Admitting: Pulmonary Disease

## 2023-08-23 ENCOUNTER — Encounter (HOSPITAL_COMMUNITY): Payer: Self-pay

## 2023-08-23 VITALS — BP 118/72 | Ht 67.0 in | Wt 383.2 lb

## 2023-08-23 DIAGNOSIS — D860 Sarcoidosis of lung: Secondary | ICD-10-CM | POA: Insufficient documentation

## 2023-08-23 HISTORY — DX: Sarcoidosis of lung: D86.0

## 2023-08-23 NOTE — Progress Notes (Signed)
 Margaret Golden 33 y.o. female  Pulmonary Rehab Orientation Note  This patient who was referred to Pulmonary Rehab by Dr. Diania Fortes with the diagnosis of pulmonary sarcoidosis arrived today in Cardiac and Pulmonary Rehab. She  arrived ambulatory with assistive device with normal gait. She  does not carry portable oxygen. Per patient, Margaret Golden uses oxygen never. Color good, skin warm and dry. Patient is oriented to time and place. Patient's medical history, psychosocial health, and medications reviewed.   Psychosocial assessment reveals patient lives with alone. Margaret Golden is currently unemployed. Patient hobbies include watching tv and spending time with others. Patient reports her stress level is high. Areas of stress/anxiety include health, family , and finances. Patient does exhibit signs of depression. Signs of depression include guilt, helplessness, hopelessness, and panic and fatigue. PHQ2/9 score 3/16. Margaret Golden shows fair  coping skills with positive outlook on life. Offered emotional support and reassurance. Will continue to monitor and evaluate progress toward psychosocial goal(s) of decreased s/s of stress, depression, and anxiety.   Physical assessment reveals: Well appearing, A&Ox4, NAD Eyes/Ears: wears corrective glasses Lungs: Clear with no wheezes, rales, rhonchi, denies chronic cough, dyspnea on exertion Heart: Regular rate rhythm, no murmurs, no rubs, no clicks Gastrointestinal: abdomin soft, + bowel sounds in all 4 quads, denies recent weight gain or loss, endorses normal BMs Genitourinary: WNL, pt denies s/s Extremities:  +2 pulses, grip strength equal, strong, no edema, no cyanosis, no clubbing Integumentary: pt denies any rashes, open or non healing wounds Psy/Soc: Pt endorses depression, stress and anxiety. She is currently under the care of a psychiatrist and sees a therapist weekly. She declines any additional needs or resources.  Assistive devices: Pt uses a cane, has orthopedic  splints for wrist and ankle   Margaret Golden reports she does take medications as prescribed. Patient states she follows a regular  diet. The patient has been trying to lose weight through a healthy diet and exercise program. Pt's weight will be monitored closely.   Demonstration and practice of PLB using pulse oximeter. Margaret Golden able to return demonstration satisfactorily. Safety and hand hygiene in the exercise area reviewed with patient. Margaret Golden voices understanding of the information reviewed. Department expectations discussed with patient and achievable goals were set. The patient shows enthusiasm about attending the program and we look forward to working with Margaret Golden. Margaret Golden completed a 6 min walk test today and is scheduled to begin exercise on 08/29/23 at 1315.   [id number] Margaret Golden

## 2023-08-23 NOTE — Progress Notes (Signed)
 Pulmonary Individual Treatment Plan  Patient Details  Name: Margaret Golden MRN: [id number] Date of Birth: 09-May-1990 Referring Provider:   Gattis Kass Pulmonary Rehab Walk Test from 08/23/2023 in [facility]  Referring Provider Dewald       Initial Encounter Date:  Flowsheet Row Pulmonary Rehab Walk Test from 08/23/2023 in [facility]  Date 08/23/23       Visit Diagnosis: Pulmonary sarcoidosis (HCC)  Patient's Home Medications on Admission:   Current Outpatient Medications:    albuterol  (VENTOLIN  HFA) 108 (90 Base) MCG/ACT inhaler, Inhale 2 puffs into the lungs every 6 (six) hours as needed for wheezing or shortness of breath., Disp: 8 g, Rfl: 2   aspirin -acetaminophen -caffeine  (EXCEDRIN  MIGRAINE) 250-250-65 MG tablet, Take 2 tablets by mouth daily as needed for headache or migraine., Disp: , Rfl:    clotrimazole -betamethasone  (LOTRISONE ) cream, APPLY TOPICALLY TWICE DAILY TO ABDOMINAL FOLD FOR 14 DAYS, Disp: 45 g, Rfl: 0   diphenhydrAMINE  HCl, Sleep, (ZZZQUIL PO), Take by mouth at bedtime as needed., Disp: , Rfl:    folic acid  (FOLVITE ) 1 MG tablet, TAKE 2 TABLETS BY MOUTH DAILY, Disp: 60 tablet, Rfl: 3   furosemide  (LASIX ) 20 MG tablet, TAKE 1 TABLET BY MOUTH TWO TIMES A WEEK, Disp: 26 tablet, Rfl: 5   Iron -FA-B Cmp-C-Biot-Probiotic (FUSION PLUS) CAPS, One capsule twice daily with orange juice. Stop your Ferrous sulfate ., Disp: 30 capsule, Rfl: 1   levonorgestrel  (MIRENA , 52 MG,) 20 MCG/DAY IUD, 1 each by Intrauterine route once., Disp: , Rfl:    levothyroxine  (SYNTHROID ) 200 MCG tablet, Take 1 tablet (200 mcg total) by mouth daily before breakfast., Disp: 90 tablet, Rfl: 0   LORazepam  (ATIVAN ) 0.5 MG tablet, Take 1 tablet (0.5 mg total) by mouth every 8 (eight) hours as needed for anxiety., Disp: 30 tablet, Rfl: 4   lurasidone  (LATUDA ) 40 MG TABS tablet, Take 1  tablet (40 mg total) by mouth daily with supper., Disp: 90 tablet, Rfl: 1   meloxicam  (MOBIC ) 7.5 MG tablet, Take 1 tablet (7.5 mg total) by mouth daily., Disp: 14 tablet, Rfl: 0   methocarbamol  (ROBAXIN ) 500 MG tablet, Take 1 tablet (500 mg total) by mouth every 8 (eight) hours as needed for muscle spasms., Disp: 20 tablet, Rfl: 0   methotrexate  (RHEUMATREX) 2.5 MG tablet, TAKE 6 TABLETS BY MOUTH ONCE A WEEK; PROTECT MEDICINE FROM LIGHT, Disp: 72 tablet, Rfl: 1   methylphenidate  (RITALIN ) 10 MG tablet, Take 1/2-1 tablet daily as needed, Disp: 30 tablet, Rfl: 0   Multiple Vitamins-Minerals (MULTIVITAMIN WITH MINERALS) tablet, Take 1 tablet by mouth daily., Disp: , Rfl:    nystatin  (MYCOSTATIN /NYSTOP ) powder, Apply 1 Application topically 2 (two) times daily as needed., Disp: 60 g, Rfl: 1   omeprazole  (PRILOSEC) 20 MG capsule, TAKE 2 CAPSULES BY MOUTH DAILY, Disp: 180 capsule, Rfl: 1   ondansetron  (ZOFRAN ) 4 MG tablet, Take 1 tablet (4 mg total) by mouth every 8 (eight) hours as needed for nausea or vomiting., Disp: 20 tablet, Rfl: 0   ondansetron  (ZOFRAN -ODT) 4 MG disintegrating tablet, Take 1 tablet (4 mg total) by mouth every 8 (eight) hours as needed for nausea or vomiting., Disp: 20 tablet, Rfl: 0   potassium chloride  SA (KLOR-CON ) 20 MEQ tablet, Take 1 tablet (20 mEq total) by mouth 2 (two) times a week., Disp: 26 tablet, Rfl: 0   propranolol  (INDERAL ) 20 MG tablet, TAKE 1  TABLET (20 MG TOTAL) BY MOUTH 3 (THREE) TIMES DAILY., Disp: 270 tablet, Rfl: 3   rizatriptan  (MAXALT ) 10 MG tablet, Take 1 tablet (10 mg total) by mouth as needed for migraine. May repeat in 2 hours if needed, Disp: 10 tablet, Rfl: 0   sertraline  (ZOLOFT ) 100 MG tablet, Take 2 tablets (200 mg total) by mouth daily., Disp: 180 tablet, Rfl: 1   topiramate  (TOPAMAX ) 50 MG tablet, Take 2 tablets (100 mg total) by mouth at bedtime. TAKE 1 TABLET BY MOUTH EVERY NIGHT AT BEDTIME FOR 7 DAYS THEN INCREASE TO 2 TABLETS BY MOUTH EVERY  NIGHT AT BEDTIME, Disp: 180 tablet, Rfl: 0   gabapentin  (NEURONTIN ) 300 MG capsule, Take 1 capsule (300 mg total) by mouth 2 (two) times daily., Disp: 180 capsule, Rfl: 1   Vitamin D , Ergocalciferol , (DRISDOL ) 1.25 MG (50000 UNIT) CAPS capsule, Take 1 capsule (50,000 Units total) by mouth 2 (two) times a week. (Patient not taking: Reported on 08/23/2023), Disp: 12 capsule, Rfl: 0  Past Medical History: Past Medical History:  Diagnosis Date   Acid reflux    ADD (attention deficit disorder)    Anemia    Anxiety    Arthritis    left ankle   Back pain    Chest pain    Complication of anesthesia    Depression    Dyspnea    with exertion   Exercise-induced asthma    as a child   Fatty liver    History of blood transfusion    History of chicken pox    History of kidney stones    passed    Hypothyroidism    Joint pain    Lower extremity edema    Major depressive disorder    Migraines    chronic   Palpitations    Pneumonia    x 2   PONV (postoperative nausea and vomiting)    nausea   RLS (restless legs syndrome)    Sarcoidosis of lung (HCC)    Sleep apnea 2021   Thyroid  disease    hypothyroid    Tobacco Use: Social History   Tobacco Use  Smoking Status Never   Passive exposure: Never  Smokeless Tobacco Never    Labs: Review Flowsheet  More data exists      Latest Ref Rng & Units 07/21/2019 07/30/2019 09/03/2019 06/23/2020 11/26/2022  Labs for ITP Cardiac and Pulmonary Rehab  Cholestrol 100 - 199 mg/dL 161  - 096  - -  LDL (calc) 0 - 99 mg/dL 78  - 89  - -  HDL-C >04 mg/dL 54.09  - 39  - -  Trlycerides 0 - 149 mg/dL 811.9  - 147  - -  Hemoglobin A1c 4.6 - 6.5 % - 5.0  4.9  - 4.3   PH, Arterial 7.350 - 7.450 - - - 7.446  -  PCO2 arterial 32.0 - 48.0 mmHg - - - 33.9  -  Bicarbonate 20.0 - 28.0 mmol/L - - - 23.0  -  Acid-base deficit 0.0 - 2.0 mmol/L - - - 0.5  -  O2 Saturation % - - - 98.3  -    Capillary Blood Glucose: Lab Results  Component Value Date   GLUCAP  106 (H) 06/28/2020   GLUCAP 118 (H) 06/28/2020   GLUCAP 141 (H) 06/27/2020   GLUCAP 139 (H) 06/27/2020   GLUCAP 130 (H) 06/27/2020     Pulmonary Assessment Scores:  Pulmonary Assessment Scores     Row Name  08/23/23 1315         ADL UCSD   ADL Phase Entry     SOB Score total 89       CAT Score   CAT Score 23       mMRC Score   mMRC Score 4             UCSD: Self-administered rating of dyspnea associated with activities of daily living (ADLs) 6-point scale (0 = "not at all" to 5 = "maximal or unable to do because of breathlessness")  Scoring Scores range from 0 to 120.  Minimally important difference is 5 units  CAT: CAT can identify the health impairment of COPD patients and is better correlated with disease progression.  CAT has a scoring range of zero to 40. The CAT score is classified into four groups of low (less than 10), medium (10 - 20), high (21-30) and very high (31-40) based on the impact level of disease on health status. A CAT score over 10 suggests significant symptoms.  A worsening CAT score could be explained by an exacerbation, poor medication adherence, poor inhaler technique, or progression of COPD or comorbid conditions.  CAT MCID is 2 points  mMRC: mMRC (Modified Medical Research Council) Dyspnea Scale is used to assess the degree of baseline functional disability in patients of respiratory disease due to dyspnea. No minimal important difference is established. A decrease in score of 1 point or greater is considered a positive change.   Pulmonary Function Assessment:  Pulmonary Function Assessment - 08/23/23 1314       Breath   Bilateral Breath Sounds Clear    Shortness of Breath Yes;Limiting activity;Fear of Shortness of Breath             Exercise Target Goals: Exercise Program Goal: Individual exercise prescription set using results from initial 6 min walk test and THRR while considering  patient's activity barriers and safety.    Exercise Prescription Goal: Initial exercise prescription builds to 30-45 minutes a day of aerobic activity, 2-3 days per week.  Home exercise guidelines will be given to patient during program as part of exercise prescription that the participant will acknowledge.  Activity Barriers & Risk Stratification:  Activity Barriers & Cardiac Risk Stratification - 08/23/23 1313       Activity Barriers & Cardiac Risk Stratification   Activity Barriers Joint Problems;Deconditioning;Muscular Weakness;Arthritis;Other (comment);Back Problems;Balance Concerns    Comments chronic pain, right knee pain             6 Minute Walk:  6 Minute Walk     Row Name 08/23/23 1438         6 Minute Walk   Phase Initial     Distance 895 feet     Walk Time 6 minutes     # of Rest Breaks 1  pulse ox not registering :50-1:05     MPH 1.7     METS 2.66     RPE 10     Perceived Dyspnea  1     VO2 Peak 9.31     Symptoms Yes (comment)     Comments 1/10 chest tightness with rest     Resting HR 98 bpm     Resting BP 118/72     Resting Oxygen Saturation  97 %     Exercise Oxygen Saturation  during 6 min walk 96 %     Max Ex. HR 151 bpm     Max Ex. BP 116/70  2 Minute Post BP 122/74       Interval HR   1 Minute HR 132     2 Minute HR 127     3 Minute HR 137     4 Minute HR 142     5 Minute HR 143     6 Minute HR 151     2 Minute Post HR 118     Interval Heart Rate? Yes       Interval Oxygen   Interval Oxygen? Yes     Baseline Oxygen Saturation % 97 %     1 Minute Oxygen Saturation % 98 %     1 Minute Liters of Oxygen 0 L     2 Minute Oxygen Saturation % 95 %     2 Minute Liters of Oxygen 0 L     3 Minute Oxygen Saturation % 96 %     3 Minute Liters of Oxygen 0 L     4 Minute Oxygen Saturation % 97 %     4 Minute Liters of Oxygen 0 L     5 Minute Oxygen Saturation % 97 %     5 Minute Liters of Oxygen 0 L     6 Minute Oxygen Saturation % 97 %     6 Minute Liters of Oxygen 0 L     2  Minute Post Oxygen Saturation % 98 %     2 Minute Post Liters of Oxygen 0 L              Oxygen Initial Assessment:  Oxygen Initial Assessment - 08/23/23 1314       Home Oxygen   Home Oxygen Device None    Sleep Oxygen Prescription None    Home Exercise Oxygen Prescription None    Home Resting Oxygen Prescription None      Initial 6 min Walk   Oxygen Used None      Program Oxygen Prescription   Program Oxygen Prescription None      Intervention   Short Term Goals To learn and understand importance of maintaining oxygen saturations>88%;To learn and understand importance of monitoring SPO2 with pulse oximeter and demonstrate accurate use of the pulse oximeter.;To learn and demonstrate proper pursed lip breathing techniques or other breathing techniques.     Long  Term Goals Maintenance of O2 saturations>88%;Verbalizes importance of monitoring SPO2 with pulse oximeter and return demonstration;Exhibits proper breathing techniques, such as pursed lip breathing or other method taught during program session             Oxygen Re-Evaluation:   Oxygen Discharge (Final Oxygen Re-Evaluation):   Initial Exercise Prescription:  Initial Exercise Prescription - 08/23/23 1400       Date of Initial Exercise RX and Referring Provider   Date 08/23/23    Referring Provider Dewald    Expected Discharge Date 11/19/23      Recumbant Bike   Level 1    RPM 60    Minutes 15    METs 1.7      T5 Nustep   Level 1    SPM 60    Minutes 15    METs 1.7      Prescription Details   Frequency (times per week) 2    Duration Progress to 30 minutes of continuous aerobic without signs/symptoms of physical distress      Intensity   THRR 40-80% of Max Heartrate 75-150    Ratings of Perceived Exertion 11-13  Perceived Dyspnea 0-4      Progression   Progression Continue progressive overload as per policy without signs/symptoms or physical distress.      Resistance Training   Training  Prescription Yes    Weight blue bands    Reps 10-15             Perform Capillary Blood Glucose checks as needed.  Exercise Prescription Changes:   Exercise Comments:   Exercise Goals and Review:   Exercise Goals     Row Name 08/23/23 1313             Exercise Goals   Increase Physical Activity Yes       Intervention Provide advice, education, support and counseling about physical activity/exercise needs.;Develop an individualized exercise prescription for aerobic and resistive training based on initial evaluation findings, risk stratification, comorbidities and participant's personal goals.       Expected Outcomes Short Term: Attend rehab on a regular basis to increase amount of physical activity.;Long Term: Exercising regularly at least 3-5 days a week.;Long Term: Add in home exercise to make exercise part of routine and to increase amount of physical activity.       Increase Strength and Stamina Yes       Intervention Provide advice, education, support and counseling about physical activity/exercise needs.;Develop an individualized exercise prescription for aerobic and resistive training based on initial evaluation findings, risk stratification, comorbidities and participant's personal goals.       Expected Outcomes Short Term: Increase workloads from initial exercise prescription for resistance, speed, and METs.;Short Term: Perform resistance training exercises routinely during rehab and add in resistance training at home;Long Term: Improve cardiorespiratory fitness, muscular endurance and strength as measured by increased METs and functional capacity ( )       Able to understand and use rate of perceived exertion (RPE) scale Yes       Intervention Provide education and explanation on how to use RPE scale       Expected Outcomes Short Term: Able to use RPE daily in rehab to express subjective intensity level;Long Term:  Able to use RPE to guide intensity level when  exercising independently       Able to understand and use Dyspnea scale Yes       Intervention Provide education and explanation on how to use Dyspnea scale       Expected Outcomes Short Term: Able to use Dyspnea scale daily in rehab to express subjective sense of shortness of breath during exertion;Long Term: Able to use Dyspnea scale to guide intensity level when exercising independently       Knowledge and understanding of Target Heart Rate Range (THRR) Yes       Intervention Provide education and explanation of THRR including how the numbers were predicted and where they are located for reference       Expected Outcomes Short Term: Able to state/look up THRR;Short Term: Able to use daily as guideline for intensity in rehab;Long Term: Able to use THRR to govern intensity when exercising independently       Understanding of Exercise Prescription Yes       Intervention Provide education, explanation, and written materials on patient's individual exercise prescription       Expected Outcomes Short Term: Able to explain program exercise prescription;Long Term: Able to explain home exercise prescription to exercise independently                Exercise Goals Re-Evaluation :   Discharge  Exercise Prescription (Final Exercise Prescription Changes):   Nutrition:  Target Goals: Understanding of nutrition guidelines, daily intake of sodium 1500mg , cholesterol 200mg , calories 30% from fat and 7% or less from saturated fats, daily to have 5 or more servings of fruits and vegetables.  Biometrics:  Pre Biometrics - 08/23/23 1445       Pre Biometrics   Grip Strength 30 kg              Nutrition Therapy Plan and Nutrition Goals:   Nutrition Assessments:  MEDIFICTS Score Key: >=70 Need to make dietary changes  40-70 Heart Healthy Diet <= 40 Therapeutic Level Cholesterol Diet   Picture Your Plate Scores: <16 Unhealthy dietary pattern with much room for improvement. 41-50  Dietary pattern unlikely to meet recommendations for good health and room for improvement. 51-60 More healthful dietary pattern, with some room for improvement.  >60 Healthy dietary pattern, although there may be some specific behaviors that could be improved.    Nutrition Goals Re-Evaluation:   Nutrition Goals Discharge (Final Nutrition Goals Re-Evaluation):   Psychosocial: Target Goals: Acknowledge presence or absence of significant depression and/or stress, maximize coping skills, provide positive support system. Participant is able to verbalize types and ability to use techniques and skills needed for reducing stress and depression.  Initial Review & Psychosocial Screening:  Initial Psych Review & Screening - 08/23/23 1315       Initial Review   Current issues with Current Anxiety/Panic;Current Depression;History of Depression;Current Psychotropic Meds;Current Stress Concerns    Source of Stress Concerns Chronic Illness;Financial    Comments Pt states she has had mental health issues since she was 33 years old. Pt stated she lost her mom at 25 and it's been her and her dad since then. Her dad was in the [employer] and she moved around a lot. She is currently under the care of a psychiatrist and sees a therapist weekly. She declines any additional needs or resources. Sasha lives alone, has 2 cats, her dad lives in [place]. She has friends in the area and they get together weekly for game nights.      Family Dynamics   Good Support System? Yes    Comments Her dad is her biggest support, also has friends who she can lean on      Barriers   Psychosocial barriers to participate in program Psychosocial barriers identified (see note)      Screening Interventions   Interventions Provide feedback about the scores to participant;Encouraged to exercise    Expected Outcomes Short Term goal: Utilizing psychosocial counselor, staff and physician to assist with identification of specific Stressors or  current issues interfering with healing process. Setting desired goal for each stressor or current issue identified.;Long Term Goal: Stressors or current issues are controlled or eliminated.;Short Term goal: Identification and review with participant of any Quality of Life or Depression concerns found by scoring the questionnaire.;Long Term goal: The participant improves quality of Life and PHQ9 Scores as seen by post scores and/or verbalization of changes   psychiatrist every 3 months, therapist weekly            Quality of Life Scores:  Scores of 19 and below usually indicate a poorer quality of life in these areas.  A difference of  2-3 points is a clinically meaningful difference.  A difference of 2-3 points in the total score of the Quality of Life Index has been associated with significant improvement in overall quality of life, self-image, physical symptoms,  and general health in studies assessing change in quality of life.  PHQ-9: Review Flowsheet  More data exists      08/23/2023 11/26/2022 06/20/2022 09/25/2021 11/16/2020  Depression screen PHQ 2/9  Decreased Interest 2 0 2 3 3   Down, Depressed, Hopeless 1 1 2 3 3   PHQ - 2 Score 3 1 4 6 6   Altered sleeping 3 1 3 3 3   Tired, decreased energy 3 3 3 3 3   Change in appetite 2 0 3 2 3   Feeling bad or failure about yourself  1 0 3 2 3   Trouble concentrating 2 0 2 3 -  Moving slowly or fidgety/restless 2 0 1 - 2  Suicidal thoughts 0 0 0 1 0  PHQ-9 Score 16 5 19 20 20   Difficult doing work/chores Very difficult Not difficult at all Somewhat difficult Extremely dIfficult -   Interpretation of Total Score  Total Score Depression Severity:  1-4 = Minimal depression, 5-9 = Mild depression, 10-14 = Moderate depression, 15-19 = Moderately severe depression, 20-27 = Severe depression   Psychosocial Evaluation and Intervention:  Psychosocial Evaluation - 08/23/23 1342       Psychosocial Evaluation & Interventions   Interventions  Encouraged to exercise with the program and follow exercise prescription    Comments Pt states she has had mental health issues since she was 33 years old. Pt stated she lost her mom at 76 and it's been her and her dad since then. Her dad was in the [employer] and she moved around a lot. She is currently under the care of a psychiatrist and sees a therapist weekly. She declines any additional needs or resources. Missie lives alone, has 2 cats, her dad lives in [place]. She worries about her finances as she is not working and has applied for disability. She has friends in the area and they get together weekly for game nights.    Expected Outcomes For Mccartney to continue seeing her psychiatrist and therapist. To attend and participate in Pulm Rehab without any psy/soc barriers or concerns. To use positive coping mechanisms for her mental health.    Continue Psychosocial Services  Follow up required by staff             Psychosocial Re-Evaluation:   Psychosocial Discharge (Final Psychosocial Re-Evaluation):   Education: Education Goals: Education classes will be provided on a weekly basis, covering required topics. Participant will state understanding/return demonstration of topics presented.  Learning Barriers/Preferences:  Learning Barriers/Preferences - 08/23/23 1328       Learning Barriers/Preferences   Learning Barriers Sight    Learning Preferences Group Instruction;Individual Instruction;Verbal Instruction;Written Material             Education Topics: Know Your Numbers Group instruction that is supported by a PowerPoint presentation. Instructor discusses importance of knowing and understanding resting, exercise, and post-exercise oxygen saturation, heart rate, and blood pressure. Oxygen saturation, heart rate, blood pressure, rating of perceived exertion, and dyspnea are reviewed along with a normal range for these values.    Exercise for the Pulmonary Patient Group instruction  that is supported by a PowerPoint presentation. Instructor discusses benefits of exercise, core components of exercise, frequency, duration, and intensity of an exercise routine, importance of utilizing pulse oximetry during exercise, safety while exercising, and options of places to exercise outside of rehab.    MET Level  Group instruction provided by PowerPoint, verbal discussion, and written material to support subject matter. Instructor reviews what METs are and how to  increase METs.    Pulmonary Medications Verbally interactive group education provided by instructor with focus on inhaled medications and proper administration.   Anatomy and Physiology of the Respiratory System Group instruction provided by PowerPoint, verbal discussion, and written material to support subject matter. Instructor reviews respiratory cycle and anatomical components of the respiratory system and their functions. Instructor also reviews differences in obstructive and restrictive respiratory diseases with examples of each.    Oxygen Safety Group instruction provided by PowerPoint, verbal discussion, and written material to support subject matter. There is an overview of "What is Oxygen" and "Why do we need it".  Instructor also reviews how to create a safe environment for oxygen use, the importance of using oxygen as prescribed, and the risks of noncompliance. There is a brief discussion on traveling with oxygen and resources the patient may utilize.   Oxygen Use Group instruction provided by PowerPoint, verbal discussion, and written material to discuss how supplemental oxygen is prescribed and different types of oxygen supply systems. Resources for more information are provided.    Breathing Techniques Group instruction that is supported by demonstration and informational handouts. Instructor discusses the benefits of pursed lip and diaphragmatic breathing and detailed demonstration on how to perform both.      Risk Factor Reduction Group instruction that is supported by a PowerPoint presentation. Instructor discusses the definition of a risk factor, different risk factors for pulmonary disease, and how the heart and lungs work together.   Pulmonary Diseases Group instruction provided by PowerPoint, verbal discussion, and written material to support subject matter. Instructor gives an overview of the different type of pulmonary diseases. There is also a discussion on risk factors and symptoms as well as ways to manage the diseases.   Stress and Energy Conservation Group instruction provided by PowerPoint, verbal discussion, and written material to support subject matter. Instructor gives an overview of stress and the impact it can have on the body. Instructor also reviews ways to reduce stress. There is also a discussion on energy conservation and ways to conserve energy throughout the day.   Warning Signs and Symptoms Group instruction provided by PowerPoint, verbal discussion, and written material to support subject matter. Instructor reviews warning signs and symptoms of stroke, heart attack, cold and flu. Instructor also reviews ways to prevent the spread of infection.   Other Education Group or individual verbal, written, or video instructions that support the educational goals of the pulmonary rehab program.    Knowledge Questionnaire Score:  Knowledge Questionnaire Score - 08/23/23 1401       Knowledge Questionnaire Score   Pre Score 16/18             Core Components/Risk Factors/Patient Goals at Admission:  Personal Goals and Risk Factors at Admission - 08/23/23 1342       Core Components/Risk Factors/Patient Goals on Admission    Weight Management Yes;Obesity;Weight Loss    Intervention Weight Management: Develop a combined nutrition and exercise program designed to reach desired caloric intake, while maintaining appropriate intake of nutrient and fiber, sodium and  fats, and appropriate energy expenditure required for the weight goal.;Weight Management: Provide education and appropriate resources to help participant work on and attain dietary goals.;Weight Management/Obesity: Establish reasonable short term and long term weight goals.;Obesity: Provide education and appropriate resources to help participant work on and attain dietary goals.    Admit Weight 383 lb 2.6 oz (173.8 kg)    Goal Weight: Short Term 373 lb (169.2 kg)  Goal Weight: Long Term 300 lb (136.1 kg)    Expected Outcomes Short Term: Continue to assess and modify interventions until short term weight is achieved;Long Term: Adherence to nutrition and physical activity/exercise program aimed toward attainment of established weight goal;Weight Loss: Understanding of general recommendations for a balanced deficit meal plan, which promotes 1-2 lb weight loss per week and includes a negative energy balance of [phone] kcal/d;Understanding recommendations for meals to include 15-35% energy as protein, 25-35% energy from fat, 35-60% energy from carbohydrates, less than 200mg  of dietary cholesterol, 20-35 gm of total fiber daily;Understanding of distribution of calorie intake throughout the day with the consumption of 4-5 meals/snacks    Improve shortness of breath with ADL's Yes    Intervention Provide education, individualized exercise plan and daily activity instruction to help decrease symptoms of SOB with activities of daily living.    Expected Outcomes Short Term: Improve cardiorespiratory fitness to achieve a reduction of symptoms when performing ADLs;Long Term: Be able to perform more ADLs without symptoms or delay the onset of symptoms             Core Components/Risk Factors/Patient Goals Review:    Core Components/Risk Factors/Patient Goals at Discharge (Final Review):    ITP Comments:   Comments: Dr. Genetta Kenning is Medical Director for Pulmonary Rehab at [facility].

## 2023-08-27 DIAGNOSIS — F4323 Adjustment disorder with mixed anxiety and depressed mood: Secondary | ICD-10-CM | POA: Diagnosis not present

## 2023-08-29 ENCOUNTER — Encounter (HOSPITAL_COMMUNITY)

## 2023-08-29 ENCOUNTER — Telehealth (HOSPITAL_COMMUNITY): Payer: Self-pay

## 2023-08-29 NOTE — Telephone Encounter (Signed)
 Patient left message calling out for 1:15 class, stated they have "an illness" and won't be in today. No further details given.

## 2023-09-03 ENCOUNTER — Encounter (HOSPITAL_COMMUNITY)
Admission: RE | Admit: 2023-09-03 | Discharge: 2023-09-03 | Disposition: A | Discharge status: Home / Self Care | Source: Ambulatory Visit | Attending: Pulmonary Disease | Admitting: Pulmonary Disease

## 2023-09-03 DIAGNOSIS — D860 Sarcoidosis of lung: Secondary | ICD-10-CM | POA: Insufficient documentation

## 2023-09-03 NOTE — Progress Notes (Signed)
 Daily Session Note  Patient Details  Name: Margaret Golden MRN: [id number] Date of Birth: [birth year] Referring Provider:   Gattis Kass Pulmonary Rehab Walk Test from 08/23/2023 in [facility]  Referring Provider Dewald       Encounter Date: 09/03/2023  Check In:  Session Check In - 09/03/23 1433       Check-In   Supervising physician immediately available to respond to emergencies [facility] MD immediately available    Physician(s) Koren Persons, NP    Location [facility]    Staff Present Atlas Lea, MS, ACSM-CEP, Exercise Physiologist;Johnny Alexia Angelucci, MS, Exercise Physiologist;Randi Rochelle Chu, ACSM-CEP, Exercise Physiologist;Casey Dickson Founds, RN, BSN    Virtual Visit No    Medication changes reported     No    Fall or balance concerns reported    Yes    Comments uses cane    Tobacco Cessation No Change    Warm-up and Cool-down Performed as group-led instruction   only   Resistance Training Performed Yes    VAD Patient? No    PAD/SET Patient? No      Pain Assessment   Currently in Pain? No/denies    Pain Score 0-No pain    Pain Onset More than a month ago    Multiple Pain Sites No             Capillary Blood Glucose: No results found for this or any previous visit (from the past 24 hours).    Social History   Tobacco Use  Smoking Status Never   Passive exposure: Never  Smokeless Tobacco Never    Goals Met:  Proper associated with RPD/PD & O2 Sat Exercise tolerated well No report of concerns or symptoms today Strength training completed today  Goals Unmet:  Not Applicable  Comments: Service time is from 1317 to 1440.    Dr. Genetta Kenning is Medical Director for Pulmonary Rehab at [facility].

## 2023-09-05 ENCOUNTER — Encounter (HOSPITAL_COMMUNITY)
Admission: RE | Admit: 2023-09-05 | Discharge: 2023-09-05 | Disposition: A | Discharge status: Home / Self Care | Source: Ambulatory Visit | Attending: Pulmonary Disease | Admitting: Pulmonary Disease

## 2023-09-05 DIAGNOSIS — D860 Sarcoidosis of lung: Secondary | ICD-10-CM

## 2023-09-05 NOTE — Progress Notes (Signed)
 Daily Session Note  Patient Details  Name: Margaret Golden MRN: [id number] Date of Birth: 1990-11-04 Referring Provider:   Gattis Kass Pulmonary Rehab Walk Test from 08/23/2023 in [facility]  Referring Provider Dewald       Encounter Date: 09/05/2023  Check In:  Session Check In - 09/05/23 1428       Check-In   Supervising physician immediately available to respond to emergencies [facility] MD immediately available    Physician(s) Palmer Bobo, NP    [facility]    Staff Present Atlas Lea, MS, ACSM-CEP, Exercise Physiologist;Randi Rochelle Chu, ACSM-CEP, Exercise Physiologist;Lynsey Ange Felipe Horton, [place] Breen, RN, Malvin Searing, MS, ACSM-CEP, CCRP, Exercise Physiologist    Virtual Visit No    Medication changes reported     No    Fall or balance concerns reported    Yes    Comments uses cane    Tobacco Cessation No Change    Warm-up and Cool-down Performed as group-led instruction    Resistance Training Performed Yes    VAD Patient? No    PAD/SET Patient? No      Pain Assessment   Currently in Pain? No/denies    Pain Score 0-No pain    Multiple Pain Sites No             Capillary Blood Glucose: No results found for this or any previous visit (from the past 24 hours).    Social History   Tobacco Use  Smoking Status Never   Passive exposure: Never  Smokeless Tobacco Never    Goals Met:  Proper associated with RPD/PD & O2 Sat Independence with exercise equipment Exercise tolerated well No report of concerns or symptoms today Strength training completed today  Goals Unmet:  Not Applicable  Comments: Service time is from 1308 to 1443.    Dr. Genetta Kenning is Medical Director for Pulmonary Rehab at [facility].

## 2023-09-10 ENCOUNTER — Encounter (HOSPITAL_COMMUNITY)
Admission: RE | Admit: 2023-09-10 | Discharge: 2023-09-10 | Disposition: A | Discharge status: Home / Self Care | Source: Ambulatory Visit | Attending: Pulmonary Disease | Admitting: Pulmonary Disease

## 2023-09-10 VITALS — Wt 387.3 lb

## 2023-09-10 DIAGNOSIS — D860 Sarcoidosis of lung: Secondary | ICD-10-CM

## 2023-09-10 NOTE — Progress Notes (Signed)
 Daily Session Note  Patient Details  Name: Margaret Golden MRN: [id number] Date of Birth: 16-May-1990 Referring Provider:   Gattis Kass Pulmonary Rehab Walk Test from 08/23/2023 in [facility]  Referring Provider Dewald       Encounter Date: 09/10/2023  Check In:  Session Check In - 09/10/23 1438       Check-In   Supervising physician immediately available to respond to emergencies [facility] MD immediately available    Physician(s) Palmer Bobo, NP    [facility]    Staff Present Atlas Lea, MS, ACSM-CEP, Exercise Physiologist;Randi Rochelle Chu, ACSM-CEP, Exercise Physiologist;Kamiya Acord Carmen Chol, RN, BSN    Virtual Visit No    Medication changes reported     No    Fall or balance concerns reported    Yes    Comments uses cane    Tobacco Cessation No Change    Warm-up and Cool-down Performed as group-led instruction    Resistance Training Performed Yes    VAD Patient? No    PAD/SET Patient? No      Pain Assessment   Currently in Pain? No/denies    Multiple Pain Sites No             Capillary Blood Glucose: No results found for this or any previous visit (from the past 24 hours).   Exercise Prescription Changes - 09/10/23 1500       Response to Exercise   Blood Pressure (Admit) 122/66    Blood Pressure (Exercise) 132/68    Blood Pressure (Exit) 116/80    Heart Rate (Admit) 97 bpm    Heart Rate (Exercise) 119 bpm    Heart Rate (Exit) 104 bpm    Oxygen Saturation (Admit) 98 %    Oxygen Saturation (Exercise) 97 %    Oxygen Saturation (Exit) 96 %    Rating of Perceived Exertion (Exercise) 11    Perceived Dyspnea (Exercise) 1    Duration Continue with 30 min of aerobic exercise without signs/symptoms of physical distress.    Intensity THRR unchanged      Progression   Progression Continue to progress workloads to maintain intensity without signs/symptoms of physical  distress.      Resistance Training   Training Prescription Yes    Weight blue bands    Reps 10-15    Time 10 Minutes      Recumbant Bike   Level 1    Minutes 15    METs 1.9      T5 Nustep   Level 1    SPM 100    Minutes 15    METs 2.3             Social History   Tobacco Use  Smoking Status Never   Passive exposure: Never  Smokeless Tobacco Never    Goals Met:  Proper associated with RPD/PD & O2 Sat Independence with exercise equipment Exercise tolerated well No report of concerns or symptoms today Strength training completed today  Goals Unmet:  Not Applicable  Comments: Service time is from 1316 to 1442.    Dr. Genetta Kenning is Medical Director for Pulmonary Rehab at [facility].

## 2023-09-12 ENCOUNTER — Encounter (HOSPITAL_COMMUNITY)
Admission: RE | Admit: 2023-09-12 | Discharge: 2023-09-12 | Disposition: A | Discharge status: Home / Self Care | Source: Ambulatory Visit | Attending: Pulmonary Disease | Admitting: Pulmonary Disease

## 2023-09-12 DIAGNOSIS — D860 Sarcoidosis of lung: Secondary | ICD-10-CM

## 2023-09-12 NOTE — Progress Notes (Signed)
 Daily Session Note  Patient Details  Name: Margaret Golden MRN: [id number] Date of Birth: May 16, 1990 Referring Provider:   Gattis Kass Pulmonary Rehab Walk Test from 08/23/2023 in [facility]  Referring Provider Dewald    Encounter Date: 09/12/2023  Check In:  Session Check In - 09/12/23 1354       Check-In   Supervising physician immediately available to respond to emergencies [facility] MD immediately available    Physician(s) Theresia Flasher, NP    [facility]    Staff Present Atlas Lea, MS, ACSM-CEP, Exercise Physiologist;Randi Rochelle Chu, ACSM-CEP, Exercise Physiologist;Addaleigh Nicholls Carmen Chol, RN, BSN    Virtual Visit No    Medication changes reported     No    Fall or balance concerns reported    Yes    Comments uses cane    Tobacco Cessation No Change    Warm-up and Cool-down Performed as group-led instruction    Resistance Training Performed Yes    VAD Patient? No    PAD/SET Patient? No      Pain Assessment   Currently in Pain? No/denies    Multiple Pain Sites No          Capillary Blood Glucose: No results found for this or any previous visit (from the past 24 hours).    Social History   Tobacco Use  Smoking Status Never   Passive exposure: Never  Smokeless Tobacco Never    Goals Met:  Proper associated with RPD/PD & O2 Sat Independence with exercise equipment Exercise tolerated well No report of concerns or symptoms today Strength training completed today  Goals Unmet:  Not Applicable  Comments: Service time is from 1306 to 1441.    Dr. Genetta Kenning is Medical Director for Pulmonary Rehab at [facility].

## 2023-09-16 DIAGNOSIS — F4323 Adjustment disorder with mixed anxiety and depressed mood: Secondary | ICD-10-CM | POA: Diagnosis not present

## 2023-09-17 ENCOUNTER — Telehealth (HOSPITAL_COMMUNITY): Payer: Self-pay

## 2023-09-17 ENCOUNTER — Encounter (HOSPITAL_COMMUNITY)

## 2023-09-17 NOTE — Telephone Encounter (Signed)
 Patient c/o for 1:15 PR class, states she is winded and doesn't feel well, doesn't trust herself to drive.

## 2023-09-19 ENCOUNTER — Encounter (HOSPITAL_COMMUNITY)
Admission: RE | Admit: 2023-09-19 | Discharge: 2023-09-19 | Disposition: A | Discharge status: Home / Self Care | Source: Ambulatory Visit | Attending: Pulmonary Disease | Admitting: Pulmonary Disease

## 2023-09-19 DIAGNOSIS — D860 Sarcoidosis of lung: Secondary | ICD-10-CM | POA: Diagnosis not present

## 2023-09-19 NOTE — Progress Notes (Signed)
 Daily Session Note  Patient Details  Name: Margaret Golden MRN: [id number] Date of Birth: [birth year] Referring Provider:   Gattis Kass Pulmonary Rehab Walk Test from 08/23/2023 in [facility]  Referring Provider Dewald    Encounter Date: 09/19/2023  Check In:  Session Check In - 09/19/23 1331       Check-In   Physician(s) Lawana Pray, NP    [facility]    Staff Present Atlas Lea, MS, ACSM-CEP, Exercise Physiologist;Randi Rochelle Chu, ACSM-CEP, Exercise Physiologist;Casey Carmen Chol, RN, BSN    Virtual Visit No    Medication changes reported     No    Fall or balance concerns reported    Yes    Comments uses cane    Tobacco Cessation No Change    Warm-up and Cool-down Performed as group-led instruction    Resistance Training Performed Yes    VAD Patient? No    PAD/SET Patient? No      Pain Assessment   Currently in Pain? No/denies    Pain Score 0-No pain    Multiple Pain Sites No          Capillary Blood Glucose: No results found for this or any previous visit (from the past 24 hours).    Social History   Tobacco Use  Smoking Status Never   Passive exposure: Never  Smokeless Tobacco Never    Goals Met:  Proper associated with RPD/PD & O2 Sat Independence with exercise equipment Exercise tolerated well No report of concerns or symptoms today Strength training completed today  Goals Unmet:  Not Applicable  Comments: Service time is from 1308 to 1435.   Dr. Genetta Kenning is Medical Director for Pulmonary Rehab at [facility].

## 2023-09-20 DIAGNOSIS — F411 Generalized anxiety disorder: Secondary | ICD-10-CM | POA: Diagnosis not present

## 2023-09-20 DIAGNOSIS — F330 Major depressive disorder, recurrent, mild: Secondary | ICD-10-CM | POA: Diagnosis not present

## 2023-09-20 DIAGNOSIS — G4700 Insomnia, unspecified: Secondary | ICD-10-CM | POA: Diagnosis not present

## 2023-09-20 DIAGNOSIS — F909 Attention-deficit hyperactivity disorder, unspecified type: Secondary | ICD-10-CM | POA: Diagnosis not present

## 2023-09-23 DIAGNOSIS — F4323 Adjustment disorder with mixed anxiety and depressed mood: Secondary | ICD-10-CM | POA: Diagnosis not present

## 2023-09-24 ENCOUNTER — Encounter (HOSPITAL_COMMUNITY)
Admission: RE | Admit: 2023-09-24 | Discharge: 2023-09-24 | Disposition: A | Discharge status: Home / Self Care | Source: Ambulatory Visit | Attending: Pulmonary Disease | Admitting: Pulmonary Disease

## 2023-09-24 DIAGNOSIS — D860 Sarcoidosis of lung: Secondary | ICD-10-CM

## 2023-09-24 NOTE — Progress Notes (Signed)
 Daily Session Note  Patient Details  Name: Margaret Golden MRN: [id number] Date of Birth: 03/01/1991 Referring Provider:   Conrad Ports Pulmonary Rehab Walk Test from 08/23/2023 in [facility]  Referring Provider Dewald    Encounter Date: 09/24/2023  Check In:  Session Check In - 09/24/23 1320       Check-In   Supervising physician immediately available to respond to emergencies [facility] MD immediately available    Physician(s) Josefa Beauvais, NP    [facility]    Staff Present Johnnie Moats, MS, ACSM-CEP, Exercise Physiologist;Randi Midge HECKLE, ACSM-CEP, Exercise Physiologist;Othello Sgroi Claudene Candia Levin, RN, BSN    Virtual Visit No    Medication changes reported     No    Fall or balance concerns reported    Yes    Comments uses cane    Tobacco Cessation No Change    Warm-up and Cool-down Performed as group-led instruction    Resistance Training Performed Yes    VAD Patient? No    PAD/SET Patient? No      Pain Assessment   Currently in Pain? No/denies    Pain Score 0-No pain    Multiple Pain Sites No          Capillary Blood Glucose: No results found for this or any previous visit (from the past 24 hours).   Exercise Prescription Changes - 09/24/23 1400       Response to Exercise   Blood Pressure (Admit) 118/80    Blood Pressure (Exercise) 128/80    Blood Pressure (Exit) 112/60    Heart Rate (Admit) 108 bpm    Heart Rate (Exercise) 117 bpm    Heart Rate (Exit) 104 bpm    Oxygen Saturation (Admit) 96 %    Oxygen Saturation (Exercise) 98 %    Oxygen Saturation (Exit) 97 %    Rating of Perceived Exertion (Exercise) 11    Perceived Dyspnea (Exercise) 1    Duration Continue with 30 min of aerobic exercise without signs/symptoms of physical distress.    Intensity THRR unchanged      Progression   Progression Continue to progress workloads to maintain intensity without signs/symptoms of  physical distress.      Resistance Training   Training Prescription Yes    Weight blue bands    Reps 10-15    Time 10 Minutes      T5 Nustep   Level 2    Minutes 30    METs 1.8          Social History   Tobacco Use  Smoking Status Never   Passive exposure: Never  Smokeless Tobacco Never    Goals Met:  Proper associated with RPD/PD & O2 Sat Independence with exercise equipment Exercise tolerated well No report of concerns or symptoms today Strength training completed today  Goals Unmet:  Not Applicable  Comments: Service time is from 1304 to 1425.    Dr. Slater Staff is Medical Director for Pulmonary Rehab at [facility].

## 2023-09-26 ENCOUNTER — Encounter (HOSPITAL_COMMUNITY): Admission: RE | Admit: 2023-09-26 | Source: Ambulatory Visit

## 2023-09-27 ENCOUNTER — Telehealth (HOSPITAL_COMMUNITY): Payer: Self-pay

## 2023-09-27 NOTE — Telephone Encounter (Signed)
 Called to check on Margaret Golden since she was absent from class yesterday. Left VM with department number.

## 2023-09-30 DIAGNOSIS — F4323 Adjustment disorder with mixed anxiety and depressed mood: Secondary | ICD-10-CM | POA: Diagnosis not present

## 2023-10-01 ENCOUNTER — Encounter: Payer: Self-pay | Admitting: Family

## 2023-10-01 ENCOUNTER — Encounter (HOSPITAL_COMMUNITY): Admission: RE | Admit: 2023-10-01 | Source: Ambulatory Visit

## 2023-10-01 ENCOUNTER — Ambulatory Visit (INDEPENDENT_AMBULATORY_CARE_PROVIDER_SITE_OTHER): Admitting: Family

## 2023-10-01 VITALS — BP 122/78 | HR 93 | Temp 97.8°F | Resp 18 | Ht 67.0 in | Wt 384.8 lb

## 2023-10-01 DIAGNOSIS — M545 Low back pain, unspecified: Secondary | ICD-10-CM

## 2023-10-01 MED ORDER — METHYLPREDNISOLONE 4 MG PO TBPK: ORAL_TABLET | ORAL | 0 refills | Status: DC

## 2023-10-01 MED ORDER — CYCLOBENZAPRINE HCL 5 MG PO TABS: 5.0000 mg | ORAL_TABLET | Freq: Three times a day (TID) | ORAL | 0 refills | Status: AC | PRN

## 2023-10-01 NOTE — Progress Notes (Signed)
 Subjective:     Patient ID: Margaret Golden, female    DOB: [birth year], 33 y.o.   MRN: [id number]  Chief Complaint  Patient presents with   Back Pain    Having lower back pain for the past two weeks says she went to bed fine and woke up in a lot of pain    Back Pain    Discussed the use of AI scribe software for clinical note transcription with the patient, who gave verbal consent to proceed.  History of Present Illness  Margaret Golden is a 33 year old female who presents with severe lower back pain.  A few weeks ago, she awoke with extreme lower back pain, which has persisted and worsened. The pain is localized to the middle of her lower back and does not radiate to the buttocks or legs. It significantly affects her mobility, necessitating the use of a cane. She has difficulty moving her legs, particularly when rolling over in bed. Over-the-counter medications and methocarbamol  have not provided relief. Ice exacerbates the pain, while a heating pad with elevated feet offers some relief. There is no loss of bowel or bladder control, but she experiences increased urinary frequency. She reports weakness in both legs, with knees feeling unstable, raising concerns about potential falls. She recently began pulmonary rehab which she has been enjoying.   Health Maintenance Due  Topic Date Due   Hepatitis B Vaccines (1 of 3 - 19+ 3-dose series) Never done   HPV VACCINES (1 - Risk 3-dose SCDM series) Never done   Cervical Cancer Screening (HPV/Pap Cotest)  05/18/2022   COVID-19 Vaccine (4 - Pfizer risk 2024-25 season) 09/27/2023    Past Medical History:  Diagnosis Date   Acid reflux    ADD (attention deficit disorder)    Anemia    Anxiety    Arthritis    left ankle   Back pain    Chest pain    Complication of anesthesia    Depression    Dyspnea    with exertion   Exercise-induced asthma    as a child   Fatty liver    History of blood transfusion    History of chicken pox     History of kidney stones    passed    Hypothyroidism    Joint pain    Lower extremity edema    Major depressive disorder    Migraines    chronic   Palpitations    Pneumonia    x 2   PONV (postoperative nausea and vomiting)    nausea   RLS (restless legs syndrome)    Sarcoidosis of lung (HCC)    Sleep apnea 2021   Thyroid  disease    hypothyroid    Past Surgical History:  Procedure Laterality Date   BRONCHIAL NEEDLE ASPIRATION BIOPSY  03/31/2020   Procedure: BRONCHIAL NEEDLE ASPIRATION BIOPSIES;  Surgeon: Brenna Adine CROME, DO;  Location: [facility];  Service: Pulmonary;;   BRONCHIAL WASHINGS  03/31/2020   Procedure: BRONCHIAL WASHINGS;  Surgeon: Brenna Adine CROME, DO;  Location: [facility];  Service: Pulmonary;;   DILATATION & CURETTAGE/HYSTEROSCOPY WITH MYOSURE N/A 03/22/2020   Procedure: DILATATION & CURETTAGE/HYSTEROSCOPY WITH MYOSURE;  Surgeon: Jannis Kate Norris, MD;  Location: [facility] OR;  Service: Gynecology;  Laterality: N/A;   INTERCOSTAL NERVE BLOCK Right 06/27/2020   Procedure: INTERCOSTAL NERVE BLOCK;  Surgeon: Shyrl Linnie KIDD, MD;  Location: [facility] OR;  Service: Thoracic;  Laterality: Right;   INTRAUTERINE DEVICE (IUD)  INSERTION N/A 03/22/2020   Procedure: INTRAUTERINE DEVICE (IUD) INSERTION;  Surgeon: Jertson, Jill Evelyn, MD;  Location: [facility] OR;  Service: Gynecology;  Laterality: N/A;  Mirena  IUD insertion   KNEE ARTHROSCOPY Right 2007   LYMPH NODE BIOPSY Right 06/27/2020   Procedure: LYMPH NODE BIOPSY;  Surgeon: Shyrl Linnie KIDD, MD;  Location: [facility] OR;  Service: Thoracic;  Laterality: Right;   TONSILLECTOMY  2010   TONSILLECTOMY     VIDEO BRONCHOSCOPY WITH ENDOBRONCHIAL ULTRASOUND N/A 03/31/2020   Procedure: VIDEO BRONCHOSCOPY WITH ENDOBRONCHIAL ULTRASOUND;  Surgeon: Brenna Adine CROME, DO;  Location: [facility];  Service: Pulmonary;  Laterality: N/A;   WISDOM TOOTH EXTRACTION     WISDOM TOOTH EXTRACTION Bilateral    upper and lower    Family History   Problem Relation Age of Onset   Depression Mother    Obesity Mother        died from an infection ? MRSA   Depression Father    Sleep apnea Father    Obesity Father    Arthritis Maternal Grandmother    Breast cancer Maternal Grandmother        in remission   Diabetes Maternal Grandfather    Arthritis Maternal Grandfather    Arthritis Paternal Grandmother    Arthritis Paternal Grandfather    Cancer Paternal Aunt        colon   Cancer Paternal Uncle        brain cancer   Migraines Neg Hx     Social History   Socioeconomic History   Marital status: Single    Spouse name: Not on file   Number of children: 0   Years of education: 12   Highest education level: Not on file  Occupational History   Occupation: Conservation officer, historic buildings  Tobacco Use   Smoking status: Never    Passive exposure: Never   Smokeless tobacco: Never  Vaping Use   Vaping status: Never Used  Substance and Sexual Activity   Alcohol use: Not Currently    Comment: rare social drink maybe once every 5 blue moons   Drug use: No   Sexual activity: Never    Comment: never sexually active  Other Topics Concern   Not on file  Social History Narrative   Lives alone    Seeking disability   Only child    Lives in a one story house, has 2 cats      Caffeine : 1 16oz can   Social Drivers of Corporate investment banker Strain: Not on file  Food Insecurity: Not on file  Transportation Needs: Not on file  Physical Activity: Not on file  Stress: Not on file  Social Connections: Not on file  Intimate Partner Violence: Not on file    Outpatient Medications Prior to Visit  Medication Sig Dispense Refill   albuterol  (VENTOLIN  HFA) 108 (90 Base) MCG/ACT inhaler Inhale 2 puffs into the lungs every 6 (six) hours as needed for wheezing or shortness of breath. 8 g 2   aspirin -acetaminophen -caffeine  (EXCEDRIN  MIGRAINE) 250-250-65 MG tablet Take 2 tablets by mouth daily as needed for headache or migraine.      clotrimazole -betamethasone  (LOTRISONE ) cream APPLY TOPICALLY TWICE DAILY TO ABDOMINAL FOLD FOR 14 DAYS 45 g 0   diphenhydrAMINE  HCl, Sleep, (ZZZQUIL PO) Take by mouth at bedtime as needed.     furosemide  (LASIX ) 20 MG tablet TAKE 1 TABLET BY MOUTH TWO TIMES A WEEK 26 tablet 5   gabapentin  (NEURONTIN ) 300 MG capsule Take 1  capsule (300 mg total) by mouth 2 (two) times daily. 180 capsule 1   Iron -FA-B Cmp-C-Biot-Probiotic (FUSION PLUS) CAPS One capsule twice daily with orange juice. Stop your Ferrous sulfate . 30 capsule 1   levonorgestrel  (MIRENA , 52 MG,) 20 MCG/DAY IUD 1 each by Intrauterine route once.     levothyroxine  (SYNTHROID ) 200 MCG tablet Take 1 tablet (200 mcg total) by mouth daily before breakfast. 90 tablet 0   LORazepam  (ATIVAN ) 0.5 MG tablet Take 1 tablet (0.5 mg total) by mouth every 8 (eight) hours as needed for anxiety. 30 tablet 4   lurasidone  (LATUDA ) 40 MG TABS tablet Take 1 tablet (40 mg total) by mouth daily with supper. 90 tablet 1   methocarbamol  (ROBAXIN ) 500 MG tablet Take 1 tablet (500 mg total) by mouth every 8 (eight) hours as needed for muscle spasms. 20 tablet 0   methylphenidate  (RITALIN ) 10 MG tablet Take 1/2-1 tablet daily as needed 30 tablet 0   Multiple Vitamins-Minerals (MULTIVITAMIN WITH MINERALS) tablet Take 1 tablet by mouth daily.     nystatin  (MYCOSTATIN /NYSTOP ) powder Apply 1 Application topically 2 (two) times daily as needed. 60 g 1   omeprazole  (PRILOSEC) 20 MG capsule TAKE 2 CAPSULES BY MOUTH DAILY 180 capsule 1   ondansetron  (ZOFRAN ) 4 MG tablet Take 1 tablet (4 mg total) by mouth every 8 (eight) hours as needed for nausea or vomiting. 20 tablet 0   ondansetron  (ZOFRAN -ODT) 4 MG disintegrating tablet Take 1 tablet (4 mg total) by mouth every 8 (eight) hours as needed for nausea or vomiting. 20 tablet 0   potassium chloride  SA (KLOR-CON ) 20 MEQ tablet Take 1 tablet (20 mEq total) by mouth 2 (two) times a week. 26 tablet 0   propranolol  (INDERAL ) 20 MG  tablet TAKE 1 TABLET (20 MG TOTAL) BY MOUTH 3 (THREE) TIMES DAILY. 270 tablet 3   rizatriptan  (MAXALT ) 10 MG tablet Take 1 tablet (10 mg total) by mouth as needed for migraine. May repeat in 2 hours if needed 10 tablet 0   sertraline  (ZOLOFT ) 100 MG tablet Take 2 tablets (200 mg total) by mouth daily. 180 tablet 1   topiramate  (TOPAMAX ) 50 MG tablet Take 2 tablets (100 mg total) by mouth at bedtime. TAKE 1 TABLET BY MOUTH EVERY NIGHT AT BEDTIME FOR 7 DAYS THEN INCREASE TO 2 TABLETS BY MOUTH EVERY NIGHT AT BEDTIME 180 tablet 0   Vitamin D , Ergocalciferol , (DRISDOL ) 1.25 MG (50000 UNIT) CAPS capsule Take 1 capsule (50,000 Units total) by mouth 2 (two) times a week. 12 capsule 0   meloxicam  (MOBIC ) 7.5 MG tablet Take 1 tablet (7.5 mg total) by mouth daily. 14 tablet 0   folic acid  (FOLVITE ) 1 MG tablet TAKE 2 TABLETS BY MOUTH DAILY (Patient not taking: Reported on 10/01/2023) 60 tablet 3   methotrexate  (RHEUMATREX) 2.5 MG tablet TAKE 6 TABLETS BY MOUTH ONCE A WEEK; PROTECT MEDICINE FROM LIGHT (Patient not taking: Reported on 10/01/2023) 72 tablet 1   No facility-administered medications prior to visit.    Allergies  Allergen Reactions   Augmentin [Amoxicillin -Pot Clavulanate] Hives    Review of Systems  Musculoskeletal:  Positive for back pain.       Objective:    Physical Exam Constitutional:      General: She is not in acute distress.    Appearance: Normal appearance. She is well-developed.  HENT:     Head: Normocephalic and atraumatic.     Right Ear: External ear normal.     Left Ear:  External ear normal.   Eyes:     General: No scleral icterus.  Neck:     Thyroid : No thyromegaly.   Cardiovascular:     Rate and Rhythm: Normal rate and regular rhythm.     Heart sounds: Normal heart sounds. No murmur heard. Pulmonary:     Effort: Pulmonary effort is normal. No respiratory distress.     Breath sounds: Normal breath sounds. No wheezing.   Musculoskeletal:     Cervical back:  Neck supple.   Skin:    General: Skin is warm and dry.   Neurological:     Mental Status: She is alert and oriented to person, place, and time.     Deep Tendon Reflexes:     Reflex Scores:      Patellar reflexes are 1+ on the right side and 1+ on the left side.    Comments: Bilateral LE strength is 5/5  Psychiatric:        Mood and Affect: Mood normal.        Behavior: Behavior normal.        Thought Content: Thought content normal.        Judgment: Judgment normal.      BP 122/78 (BP Location: Right Arm, Patient Position: Sitting, Cuff Size: Large)   Pulse 93   Temp 97.8 F (36.6 C) (Oral)   Resp 18   Ht 5' 7 (1.702 m)   Wt (!) 384 lb 12.8 oz (174.5 kg)   LMP 09/18/2023 (Exact Date)   SpO2 98%   BMI 60.27 kg/m  Wt Readings from Last 3 Encounters:  10/01/23 (!) 384 lb 12.8 oz (174.5 kg)  09/10/23 (!) 387 lb 5.6 oz (175.7 kg)  08/23/23 (!) 383 lb 2.6 oz (173.8 kg)       Assessment & Plan:   Problem List Items Addressed This Visit       Unprioritized   Back pain - Primary   Relevant Medications   methylPREDNISolone  (MEDROL  DOSEPAK) 4 MG TBPK tablet   cyclobenzaprine (FLEXERIL) 5 MG tablet   Acute bilateral low back pain without sciatica   Lower back pain Acute severe lower back pain with leg weakness and increased urination frequency. No red flag symptoms. Differential includes muscle spasm, pinched nerve, or musculoskeletal issues. Discussed steroid use, Flexeril, and heating pad. - Prescribe steroid medication for inflammation and pain relief. - Prescribe Flexeril as a muscle relaxant. - Advise use of a heating pad for symptomatic relief. - Instruct to monitor for red flag symptoms such as incontinence or groin numbness and seek emergency care if she occurs. - Advise follow-up if symptoms do not improve in one week.       Relevant Medications   methylPREDNISolone  (MEDROL  DOSEPAK) 4 MG TBPK tablet   cyclobenzaprine (FLEXERIL) 5 MG tablet    I have  discontinued Margaret A. Sroka's meloxicam . I am also having her start on methylPREDNISolone  and cyclobenzaprine. Additionally, I am having her maintain her multivitamin with minerals, Fusion Plus, potassium chloride  SA, aspirin -acetaminophen -caffeine , furosemide , Mirena  (52 MG), (diphenhydrAMINE  HCl, Sleep, (ZZZQUIL PO)), Vitamin D  (Ergocalciferol ), albuterol , ondansetron , propranolol , nystatin , gabapentin , LORazepam , lurasidone , methylphenidate , sertraline , rizatriptan , ondansetron , methocarbamol , clotrimazole -betamethasone , omeprazole , levothyroxine , methotrexate , folic acid , and topiramate .  Meds ordered this encounter  Medications   methylPREDNISolone  (MEDROL  DOSEPAK) 4 MG TBPK tablet    Sig: Take per package instructions    Dispense:  21 tablet    Refill:  0    Supervising Provider:   DOMENICA BLACKBIRD A [[id number]]  cyclobenzaprine (FLEXERIL) 5 MG tablet    Sig: Take 1 tablet (5 mg total) by mouth 3 (three) times daily as needed for muscle spasms.    Dispense:  30 tablet    Refill:  0    Supervising Provider:   DOMENICA BLACKBIRD A [[id number]]

## 2023-10-01 NOTE — Assessment & Plan Note (Signed)
 Lower back pain Acute severe lower back pain with leg weakness and increased urination frequency. No red flag symptoms. Differential includes muscle spasm, pinched nerve, or musculoskeletal issues. Discussed steroid use, Flexeril, and heating pad. - Prescribe steroid medication for inflammation and pain relief. - Prescribe Flexeril as a muscle relaxant. - Advise use of a heating pad for symptomatic relief. - Instruct to monitor for red flag symptoms such as incontinence or groin numbness and seek emergency care if she occurs. - Advise follow-up if symptoms do not improve in one week.

## 2023-10-01 NOTE — Patient Instructions (Signed)
 VISIT SUMMARY:  You came in today because of severe lower back pain that has been getting worse over the past few weeks. We discussed possible causes and treatment options to help manage your pain and improve your mobility.  YOUR PLAN:  LOWER BACK PAIN: You have acute severe lower back pain with leg weakness and increased urination frequency. There are no red flag symptoms at this time. -We are prescribing a steroid medication to help with inflammation and pain relief. -We are also prescribing Flexeril, a muscle relaxant, to help with muscle spasms. -Use a heating pad for symptomatic relief. -Monitor for any red flag symptoms such as incontinence or groin numbness and seek emergency care if they occur. -Follow up if your symptoms do not improve in one week.  GENERAL HEALTH MAINTENANCE: You are engaged in pulmonary rehabilitation and have access to a nutritionist. -Schedule an aneurysm screening for early September.

## 2023-10-03 ENCOUNTER — Encounter (HOSPITAL_COMMUNITY)
Admission: RE | Admit: 2023-10-03 | Discharge: 2023-10-03 | Disposition: A | Discharge status: Home / Self Care | Source: Ambulatory Visit | Attending: Pulmonary Disease | Admitting: Pulmonary Disease

## 2023-10-03 VITALS — Wt 383.8 lb

## 2023-10-03 DIAGNOSIS — D860 Sarcoidosis of lung: Secondary | ICD-10-CM | POA: Diagnosis not present

## 2023-10-03 NOTE — Progress Notes (Signed)
 Daily Session Note  Patient Details  Name: Margaret Golden MRN: [id number] Date of Birth: 1990/11/24 Referring Provider:   Conrad Ports Pulmonary Rehab Walk Test from 08/23/2023 in [facility]  Referring Provider Dewald    Encounter Date: 10/03/2023  Check In:  Session Check In - 10/03/23 1335       Check-In   Supervising physician immediately available to respond to emergencies [facility] MD immediately available    Physician(s) Lum Louis, NP    Location [facility]    Staff Present Johnnie Moats, MS, ACSM-CEP, Exercise Physiologist;Randi Midge HECKLE, ACSM-CEP, Exercise Physiologist;Esgar Barnick Harvy, RN, BSN;Johnny Porter, MS, Exercise Physiologist    Virtual Visit No    Medication changes reported     No    Fall or balance concerns reported    Yes    Comments uses cane    Tobacco Cessation No Change    Warm-up and Cool-down Performed as group-led instruction    Resistance Training Performed Yes    VAD Patient? No    PAD/SET Patient? No      Pain Assessment   Currently in Pain? No/denies    Pain Score 0-No pain    Multiple Pain Sites No          Capillary Blood Glucose: No results found for this or any previous visit (from the past 24 hours).    Social History   Tobacco Use  Smoking Status Never   Passive exposure: Never  Smokeless Tobacco Never    Goals Met:  Independence with exercise equipment Exercise tolerated well No report of concerns or symptoms today Strength training completed today  Goals Unmet:  Not Applicable  Comments: Service time is from 1308 to 1440    Dr. Slater Staff is Medical Director for Pulmonary Rehab at [facility].

## 2023-10-07 DIAGNOSIS — F4323 Adjustment disorder with mixed anxiety and depressed mood: Secondary | ICD-10-CM | POA: Diagnosis not present

## 2023-10-08 ENCOUNTER — Encounter: Payer: Self-pay | Admitting: Family

## 2023-10-08 ENCOUNTER — Telehealth (HOSPITAL_COMMUNITY): Payer: Self-pay

## 2023-10-08 ENCOUNTER — Encounter (HOSPITAL_COMMUNITY): Admission: RE | Admit: 2023-10-08 | Source: Ambulatory Visit

## 2023-10-08 ENCOUNTER — Ambulatory Visit (INDEPENDENT_AMBULATORY_CARE_PROVIDER_SITE_OTHER): Admitting: Family

## 2023-10-08 ENCOUNTER — Ambulatory Visit (HOSPITAL_BASED_OUTPATIENT_CLINIC_OR_DEPARTMENT_OTHER)
Admission: RE | Admit: 2023-10-08 | Discharge: 2023-10-08 | Disposition: A | Discharge status: Home / Self Care | Source: Ambulatory Visit | Attending: Family | Admitting: Family

## 2023-10-08 VITALS — BP 132/74 | HR 106 | Ht 67.0 in | Wt 381.0 lb

## 2023-10-08 DIAGNOSIS — M545 Low back pain, unspecified: Secondary | ICD-10-CM | POA: Insufficient documentation

## 2023-10-08 DIAGNOSIS — R531 Weakness: Secondary | ICD-10-CM | POA: Diagnosis not present

## 2023-10-08 DIAGNOSIS — G8929 Other chronic pain: Secondary | ICD-10-CM | POA: Insufficient documentation

## 2023-10-08 DIAGNOSIS — M48061 Spinal stenosis, lumbar region without neurogenic claudication: Secondary | ICD-10-CM | POA: Diagnosis not present

## 2023-10-08 MED ORDER — HYDROCODONE-ACETAMINOPHEN 5-325 MG PO TABS: 1.0000 | ORAL_TABLET | Freq: Four times a day (QID) | ORAL | 0 refills | Status: AC | PRN

## 2023-10-08 NOTE — Telephone Encounter (Signed)
 Patient c/o for 1:15 PR class due to extreme back pain.

## 2023-10-08 NOTE — Progress Notes (Signed)
 Margaret Golden is a 33 y.o. female with the following history as recorded in [employer]:  Patient Active Problem List   Diagnosis Date Noted   Tachycardia 05/31/2023   Abnormal LFTs 05/31/2023   Well woman exam with routine gynecological exam 01/01/2023   IUD check up 01/01/2023   Candidal intertrigo 01/01/2023   Carpal tunnel syndrome of right wrist 11/26/2022   Insulin  resistance 11/26/2022   Pleuritic chest pain 10/11/2021   Atypical chest pain 09/25/2021   Sarcoidosis 05/25/2021   Acute bilateral low back pain without sciatica 04/11/2021   Complication of anesthesia 11/28/2020   PONV (postoperative nausea and vomiting) 11/28/2020   Preventative health care 11/16/2020   S/P robot-assisted surgical procedure 06/27/2020   Arthritis of right subtalar joint 06/24/2020   Adenopathy 03/31/2020   Other allergic rhinitis 03/23/2020   Adverse reaction to food, subsequent encounter 03/23/2020   Shortness of breath 03/23/2020   Acid reflux    ADD (attention deficit disorder)    Anemia    Anxiety    Arthritis    Back pain    Chest pain    Dyspnea    Exercise-induced asthma    Fatty liver    History of blood transfusion    History of chicken pox    History of kidney stones    Joint pain    Lower extremity edema    Major depressive disorder    OSA (obstructive sleep apnea) 02/23/2020   History of iron  deficiency anemia 02/23/2020   BMI 50.0-59.9, adult (HCC) 02/23/2020   Mediastinal lymphadenopathy 12/23/2019   RLS (restless legs syndrome) 12/21/2019   Super obesity 12/21/2019   Papilledema 12/21/2019   Obesity with alveolar hypoventilation and body mass index (BMI) of 40 or greater (HCC) 12/21/2019   Morning headache 12/21/2019   NSVT (nonsustained ventricular tachycardia) (HCC) 12/09/2019   Mixed hyperlipidemia 12/09/2019   Palpitations 12/09/2019   IDA (iron  deficiency anemia) 10/20/2019   Hematometra 07/15/2019   Menorrhagia with regular cycle 07/15/2019   Symptomatic  anemia 04/07/2019   Social phobia 02/26/2018   Major depressive disorder, recurrent episode, mild (HCC) 02/26/2018   Insomnia 02/26/2018   OA (osteoarthritis) of ankle 02/28/2017   Class 3 severe obesity due to excess calories with serious comorbidity in adult 05/11/2016   Fatty liver disease, nonalcoholic 02/02/2015   Depression 01/03/2015   Hypothyroidism 01/03/2015   GERD (gastroesophageal reflux disease) 01/03/2015   Migraines 01/03/2015   Essential hypertension 06/02/2014    Current Outpatient Medications  Medication Sig Dispense Refill   albuterol  (VENTOLIN  HFA) 108 (90 Base) MCG/ACT inhaler Inhale 2 puffs into the lungs every 6 (six) hours as needed for wheezing or shortness of breath. 8 g 2   aspirin -acetaminophen -caffeine  (EXCEDRIN  MIGRAINE) 250-250-65 MG tablet Take 2 tablets by mouth daily as needed for headache or migraine.     clotrimazole -betamethasone  (LOTRISONE ) cream APPLY TOPICALLY TWICE DAILY TO ABDOMINAL FOLD FOR 14 DAYS 45 g 0   cyclobenzaprine  (FLEXERIL ) 5 MG tablet Take 1 tablet (5 mg total) by mouth 3 (three) times daily as needed for muscle spasms. 30 tablet 0   diphenhydrAMINE  HCl, Sleep, (ZZZQUIL PO) Take by mouth at bedtime as needed.     furosemide  (LASIX ) 20 MG tablet TAKE 1 TABLET BY MOUTH TWO TIMES A WEEK 26 tablet 5   gabapentin  (NEURONTIN ) 300 MG capsule Take 1 capsule (300 mg total) by mouth 2 (two) times daily. 180 capsule 1   HYDROcodone -acetaminophen  (NORCO/VICODIN) 5-325 MG tablet Take 1 tablet by mouth every  6 (six) hours as needed for moderate pain (pain score 4-6). 15 tablet 0   Iron -FA-B Cmp-C-Biot-Probiotic (FUSION PLUS) CAPS One capsule twice daily with orange juice. Stop your Ferrous sulfate . 30 capsule 1   levonorgestrel  (MIRENA , 52 MG,) 20 MCG/DAY IUD 1 each by Intrauterine route once.     levothyroxine  (SYNTHROID ) 200 MCG tablet Take 1 tablet (200 mcg total) by mouth daily before breakfast. 90 tablet 0   LORazepam  (ATIVAN ) 0.5 MG tablet Take  1 tablet (0.5 mg total) by mouth every 8 (eight) hours as needed for anxiety. 30 tablet 4   lurasidone  (LATUDA ) 40 MG TABS tablet Take 1 tablet (40 mg total) by mouth daily with supper. 90 tablet 1   methocarbamol  (ROBAXIN ) 500 MG tablet Take 1 tablet (500 mg total) by mouth every 8 (eight) hours as needed for muscle spasms. 20 tablet 0   methylphenidate  (RITALIN ) 10 MG tablet Take 1/2-1 tablet daily as needed 30 tablet 0   methylPREDNISolone  (MEDROL  DOSEPAK) 4 MG TBPK tablet Take per package instructions 21 tablet 0   Multiple Vitamins-Minerals (MULTIVITAMIN WITH MINERALS) tablet Take 1 tablet by mouth daily.     nystatin  (MYCOSTATIN /NYSTOP ) powder Apply 1 Application topically 2 (two) times daily as needed. 60 g 1   omeprazole  (PRILOSEC) 20 MG capsule TAKE 2 CAPSULES BY MOUTH DAILY 180 capsule 1   ondansetron  (ZOFRAN ) 4 MG tablet Take 1 tablet (4 mg total) by mouth every 8 (eight) hours as needed for nausea or vomiting. 20 tablet 0   ondansetron  (ZOFRAN -ODT) 4 MG disintegrating tablet Take 1 tablet (4 mg total) by mouth every 8 (eight) hours as needed for nausea or vomiting. 20 tablet 0   potassium chloride  SA (KLOR-CON ) 20 MEQ tablet Take 1 tablet (20 mEq total) by mouth 2 (two) times a week. 26 tablet 0   propranolol  (INDERAL ) 20 MG tablet TAKE 1 TABLET (20 MG TOTAL) BY MOUTH 3 (THREE) TIMES DAILY. 270 tablet 3   rizatriptan  (MAXALT ) 10 MG tablet Take 1 tablet (10 mg total) by mouth as needed for migraine. May repeat in 2 hours if needed 10 tablet 0   sertraline  (ZOLOFT ) 100 MG tablet Take 2 tablets (200 mg total) by mouth daily. 180 tablet 1   topiramate  (TOPAMAX ) 50 MG tablet Take 2 tablets (100 mg total) by mouth at bedtime. TAKE 1 TABLET BY MOUTH EVERY NIGHT AT BEDTIME FOR 7 DAYS THEN INCREASE TO 2 TABLETS BY MOUTH EVERY NIGHT AT BEDTIME 180 tablet 0   Vitamin D , Ergocalciferol , (DRISDOL ) 1.25 MG (50000 UNIT) CAPS capsule Take 1 capsule (50,000 Units total) by mouth 2 (two) times a week. 12  capsule 0   folic acid  (FOLVITE ) 1 MG tablet TAKE 2 TABLETS BY MOUTH DAILY (Patient not taking: Reported on 10/08/2023) 60 tablet 3   methotrexate  (RHEUMATREX) 2.5 MG tablet TAKE 6 TABLETS BY MOUTH ONCE A WEEK; PROTECT MEDICINE FROM LIGHT (Patient not taking: Reported on 10/08/2023) 72 tablet 1   No current facility-administered medications for this visit.    Allergies: Augmentin [amoxicillin -pot clavulanate]  Past Medical History:  Diagnosis Date   Acid reflux    ADD (attention deficit disorder)    Anemia    Anxiety    Arthritis    left ankle   Back pain    Chest pain    Complication of anesthesia    Depression    Dyspnea    with exertion   Exercise-induced asthma    as a child   Fatty liver  History of blood transfusion    History of chicken pox    History of kidney stones    passed    Hypothyroidism    Joint pain    Lower extremity edema    Major depressive disorder    Migraines    chronic   Palpitations    Pneumonia    x 2   PONV (postoperative nausea and vomiting)    nausea   RLS (restless legs syndrome)    Sarcoidosis of lung (HCC)    Sleep apnea 2021   Thyroid  disease    hypothyroid    Past Surgical History:  Procedure Laterality Date   BRONCHIAL NEEDLE ASPIRATION BIOPSY  03/31/2020   Procedure: BRONCHIAL NEEDLE ASPIRATION BIOPSIES;  Surgeon: Brenna Adine CROME, DO;  Location: [facility];  Service: Pulmonary;;   BRONCHIAL WASHINGS  03/31/2020   Procedure: BRONCHIAL WASHINGS;  Surgeon: Brenna Adine CROME, DO;  Location: [facility];  Service: Pulmonary;;   DILATATION & CURETTAGE/HYSTEROSCOPY WITH MYOSURE N/A 03/22/2020   Procedure: DILATATION & CURETTAGE/HYSTEROSCOPY WITH MYOSURE;  Surgeon: Jannis Kate Norris, MD;  Location: [facility] OR;  Service: Gynecology;  Laterality: N/A;   INTERCOSTAL NERVE BLOCK Right 06/27/2020   Procedure: INTERCOSTAL NERVE BLOCK;  Surgeon: Shyrl Linnie KIDD, MD;  Location: [facility] OR;  Service: Thoracic;  Laterality: Right;   INTRAUTERINE  DEVICE (IUD) INSERTION N/A 03/22/2020   Procedure: INTRAUTERINE DEVICE (IUD) INSERTION;  Surgeon: Jannis Kate Norris, MD;  Location: [facility] OR;  Service: Gynecology;  Laterality: N/A;  Mirena  IUD insertion   KNEE ARTHROSCOPY Right 2007   LYMPH NODE BIOPSY Right 06/27/2020   Procedure: LYMPH NODE BIOPSY;  Surgeon: Shyrl Linnie KIDD, MD;  Location: [facility] OR;  Service: Thoracic;  Laterality: Right;   TONSILLECTOMY  2010   TONSILLECTOMY     VIDEO BRONCHOSCOPY WITH ENDOBRONCHIAL ULTRASOUND N/A 03/31/2020   Procedure: VIDEO BRONCHOSCOPY WITH ENDOBRONCHIAL ULTRASOUND;  Surgeon: Brenna Adine CROME, DO;  Location: [facility];  Service: Pulmonary;  Laterality: N/A;   WISDOM TOOTH EXTRACTION     WISDOM TOOTH EXTRACTION Bilateral    upper and lower    Family History  Problem Relation Age of Onset   Depression Mother    Obesity Mother        died from an infection ? MRSA   Depression Father    Sleep apnea Father    Obesity Father    Arthritis Maternal Grandmother    Breast cancer Maternal Grandmother        in remission   Diabetes Maternal Grandfather    Arthritis Maternal Grandfather    Arthritis Paternal Grandmother    Arthritis Paternal Grandfather    Cancer Paternal Aunt        colon   Cancer Paternal Uncle        brain cancer   Migraines Neg Hx     Social History   Tobacco Use   Smoking status: Never    Passive exposure: Never   Smokeless tobacco: Never  Substance Use Topics   Alcohol use: Not Currently    Comment: rare social drink maybe once every 5 blue moons    Subjective:   Saw her PCP last week with low back pain which has been present for 3 weeks; was treated with Medrol  Dose Pak last week- did not get marked improvement with muscle relaxer or steroid pack; felt that her symptoms flared back this morning; notes that pain is almost at a 10 today; feels that her legs are continuing to be weak/  using her cane for support;  Saw her PCP in February 2025 with low back pain  and again last week;    Objective:  Vitals:   10/08/23 1538  BP: 132/74  Pulse: (!) 106  SpO2: 98%  Weight: (!) 381 lb (172.8 kg)  Height: 5' 7 (1.702 m)    General: Well developed, well nourished, in no acute distress  Skin : Warm and dry.  Head: Normocephalic and atraumatic  Lungs: Respirations unlabored;  Musculoskeletal: No deformities; no active joint inflammation  Extremities: No edema, cyanosis, clubbing  Vessels: Symmetric bilaterally  Neurologic: Alert and oriented; speech intact; face symmetrical; uses cane for stability;  Assessment:  1. Chronic low back pain, unspecified back pain laterality, unspecified whether sciatica present     Plan:  Will update lumbar X-ray; to consider lumbar MRI; Rx for Norco 5/325 to use prn- patient's PCP is aware that narcotic prescription is being sent in; to consider lumbar MRI;   No follow-ups on file.  Orders Placed This Encounter  Procedures   DG Lumbar Spine Complete    Standing Status:   Future    Number of Occurrences:   1    Expiration Date:   10/07/2024    Reason for Exam (SYMPTOM  OR DIAGNOSIS REQUIRED):   low back pain with lower extremity weakness    Is patient pregnant?:   No    Preferred imaging location?:   [facility]    Requested Prescriptions   Signed Prescriptions Disp Refills   HYDROcodone -acetaminophen  (NORCO/VICODIN) 5-325 MG tablet 15 tablet 0    Sig: Take 1 tablet by mouth every 6 (six) hours as needed for moderate pain (pain score 4-6).

## 2023-10-09 ENCOUNTER — Other Ambulatory Visit: Payer: Self-pay | Admitting: Family

## 2023-10-09 DIAGNOSIS — E039 Hypothyroidism, unspecified: Secondary | ICD-10-CM

## 2023-10-09 NOTE — Progress Notes (Signed)
 Pulmonary Individual Treatment Plan  Patient Details  Name: Margaret Golden MRN: [id number] Date of Birth: [birth year] Referring Provider:   Conrad Ports Pulmonary Rehab Walk Test from 08/23/2023 in [facility]  Referring Provider Dewald    Initial Encounter Date:  Flowsheet Row Pulmonary Rehab Walk Test from 08/23/2023 in [facility]  Date 08/23/23    Visit Diagnosis: Pulmonary sarcoidosis (HCC)  Patient's Home Medications on Admission:   Current Outpatient Medications:    albuterol  (VENTOLIN  HFA) 108 (90 Base) MCG/ACT inhaler, Inhale 2 puffs into the lungs every 6 (six) hours as needed for wheezing or shortness of breath., Disp: 8 g, Rfl: 2   aspirin -acetaminophen -caffeine  (EXCEDRIN  MIGRAINE) 250-250-65 MG tablet, Take 2 tablets by mouth daily as needed for headache or migraine., Disp: , Rfl:    clotrimazole -betamethasone  (LOTRISONE ) cream, APPLY TOPICALLY TWICE DAILY TO ABDOMINAL FOLD FOR 14 DAYS, Disp: 45 g, Rfl: 0   cyclobenzaprine  (FLEXERIL ) 5 MG tablet, Take 1 tablet (5 mg total) by mouth 3 (three) times daily as needed for muscle spasms., Disp: 30 tablet, Rfl: 0   diphenhydrAMINE  HCl, Sleep, (ZZZQUIL PO), Take by mouth at bedtime as needed., Disp: , Rfl:    folic acid  (FOLVITE ) 1 MG tablet, TAKE 2 TABLETS BY MOUTH DAILY (Patient not taking: Reported on 10/08/2023), Disp: 60 tablet, Rfl: 3   furosemide  (LASIX ) 20 MG tablet, TAKE 1 TABLET BY MOUTH TWO TIMES A WEEK, Disp: 26 tablet, Rfl: 5   gabapentin  (NEURONTIN ) 300 MG capsule, Take 1 capsule (300 mg total) by mouth 2 (two) times daily., Disp: 180 capsule, Rfl: 1   HYDROcodone -acetaminophen  (NORCO/VICODIN) 5-325 MG tablet, Take 1 tablet by mouth every 6 (six) hours as needed for moderate pain (pain score 4-6)., Disp: 15 tablet, Rfl: 0   Iron -FA-B Cmp-C-Biot-Probiotic (FUSION PLUS) CAPS, One capsule twice daily with  orange juice. Stop your Ferrous sulfate ., Disp: 30 capsule, Rfl: 1   levonorgestrel  (MIRENA , 52 MG,) 20 MCG/DAY IUD, 1 each by Intrauterine route once., Disp: , Rfl:    levothyroxine  (SYNTHROID ) 200 MCG tablet, TAKE 1 TABLET BY MOUTH DAILY BEFORE BREAKFAST, Disp: 90 tablet, Rfl: 0   LORazepam  (ATIVAN ) 0.5 MG tablet, Take 1 tablet (0.5 mg total) by mouth every 8 (eight) hours as needed for anxiety., Disp: 30 tablet, Rfl: 4   lurasidone  (LATUDA ) 40 MG TABS tablet, Take 1 tablet (40 mg total) by mouth daily with supper., Disp: 90 tablet, Rfl: 1   methocarbamol  (ROBAXIN ) 500 MG tablet, Take 1 tablet (500 mg total) by mouth every 8 (eight) hours as needed for muscle spasms., Disp: 20 tablet, Rfl: 0   methotrexate  (RHEUMATREX) 2.5 MG tablet, TAKE 6 TABLETS BY MOUTH ONCE A WEEK; PROTECT MEDICINE FROM LIGHT (Patient not taking: Reported on 10/08/2023), Disp: 72 tablet, Rfl: 1   methylphenidate  (RITALIN ) 10 MG tablet, Take 1/2-1 tablet daily as needed, Disp: 30 tablet, Rfl: 0   methylPREDNISolone  (MEDROL  DOSEPAK) 4 MG TBPK tablet, Take per package instructions, Disp: 21 tablet, Rfl: 0   Multiple Vitamins-Minerals (MULTIVITAMIN WITH MINERALS) tablet, Take 1 tablet by mouth daily., Disp: , Rfl:    nystatin  (MYCOSTATIN /NYSTOP ) powder, Apply 1 Application topically 2 (two) times daily as needed., Disp: 60 g, Rfl: 1   omeprazole  (PRILOSEC) 20 MG capsule, TAKE 2 CAPSULES BY MOUTH DAILY, Disp: 180 capsule, Rfl: 1   ondansetron  (ZOFRAN ) 4 MG tablet, Take 1 tablet (4 mg total) by mouth  every 8 (eight) hours as needed for nausea or vomiting., Disp: 20 tablet, Rfl: 0   ondansetron  (ZOFRAN -ODT) 4 MG disintegrating tablet, Take 1 tablet (4 mg total) by mouth every 8 (eight) hours as needed for nausea or vomiting., Disp: 20 tablet, Rfl: 0   potassium chloride  SA (KLOR-CON ) 20 MEQ tablet, Take 1 tablet (20 mEq total) by mouth 2 (two) times a week., Disp: 26 tablet, Rfl: 0   propranolol  (INDERAL ) 20 MG tablet, TAKE 1 TABLET (20  MG TOTAL) BY MOUTH 3 (THREE) TIMES DAILY., Disp: 270 tablet, Rfl: 3   rizatriptan  (MAXALT ) 10 MG tablet, Take 1 tablet (10 mg total) by mouth as needed for migraine. May repeat in 2 hours if needed, Disp: 10 tablet, Rfl: 0   sertraline  (ZOLOFT ) 100 MG tablet, Take 2 tablets (200 mg total) by mouth daily., Disp: 180 tablet, Rfl: 1   topiramate  (TOPAMAX ) 50 MG tablet, Take 2 tablets (100 mg total) by mouth at bedtime. TAKE 1 TABLET BY MOUTH EVERY NIGHT AT BEDTIME FOR 7 DAYS THEN INCREASE TO 2 TABLETS BY MOUTH EVERY NIGHT AT BEDTIME, Disp: 180 tablet, Rfl: 0   Vitamin D , Ergocalciferol , (DRISDOL ) 1.25 MG (50000 UNIT) CAPS capsule, Take 1 capsule (50,000 Units total) by mouth 2 (two) times a week., Disp: 12 capsule, Rfl: 0  Past Medical History: Past Medical History:  Diagnosis Date   Acid reflux    ADD (attention deficit disorder)    Anemia    Anxiety    Arthritis    left ankle   Back pain    Chest pain    Complication of anesthesia    Depression    Dyspnea    with exertion   Exercise-induced asthma    as a child   Fatty liver    History of blood transfusion    History of chicken pox    History of kidney stones    passed    Hypothyroidism    Joint pain    Lower extremity edema    Major depressive disorder    Migraines    chronic   Palpitations    Pneumonia    x 2   PONV (postoperative nausea and vomiting)    nausea   RLS (restless legs syndrome)    Sarcoidosis of lung (HCC)    Sleep apnea 2021   Thyroid  disease    hypothyroid    Tobacco Use: Social History   Tobacco Use  Smoking Status Never   Passive exposure: Never  Smokeless Tobacco Never    Labs: Review Flowsheet  More data exists      Latest Ref Rng & Units 07/21/2019 07/30/2019 09/03/2019 06/23/2020 11/26/2022  Labs for ITP Cardiac and Pulmonary Rehab  Cholestrol 100 - 199 mg/dL 849  - 849  - -  LDL (calc) 0 - 99 mg/dL 78  - 89  - -  HDL-C >60 mg/dL 64.49  - 39  - -  Trlycerides 0 - 149 mg/dL 816.9  - 879   - -  Hemoglobin A1c 4.6 - 6.5 % - 5.0  4.9  - 4.3   PH, Arterial 7.350 - 7.450 - - - 7.446  -  PCO2 arterial 32.0 - 48.0 mmHg - - - 33.9  -  Bicarbonate 20.0 - 28.0 mmol/L - - - 23.0  -  Acid-base deficit 0.0 - 2.0 mmol/L - - - 0.5  -  O2 Saturation % - - - 98.3  -    Capillary Blood Glucose: Lab  Results  Component Value Date   GLUCAP 106 (H) 06/28/2020   GLUCAP 118 (H) 06/28/2020   GLUCAP 141 (H) 06/27/2020   GLUCAP 139 (H) 06/27/2020   GLUCAP 130 (H) 06/27/2020     Pulmonary Assessment Scores:  Pulmonary Assessment Scores     Row Name 08/23/23 1315         ADL UCSD   ADL Phase Entry     SOB Score total 89       CAT Score   CAT Score 23       mMRC Score   mMRC Score 4       UCSD: Self-administered rating of dyspnea associated with activities of daily living (ADLs) 6-point scale (0 = not at all to 5 = maximal or unable to do because of breathlessness)  Scoring Scores range from 0 to 120.  Minimally important difference is 5 units  CAT: CAT can identify the health impairment of COPD patients and is better correlated with disease progression.  CAT has a scoring range of zero to 40. The CAT score is classified into four groups of low (less than 10), medium (10 - 20), high (21-30) and very high (31-40) based on the impact level of disease on health status. A CAT score over 10 suggests significant symptoms.  A worsening CAT score could be explained by an exacerbation, poor medication adherence, poor inhaler technique, or progression of COPD or comorbid conditions.  CAT MCID is 2 points  mMRC: mMRC (Modified Medical Research Council) Dyspnea Scale is used to assess the degree of baseline functional disability in patients of respiratory disease due to dyspnea. No minimal important difference is established. A decrease in score of 1 point or greater is considered a positive change.   Pulmonary Function Assessment:  Pulmonary Function Assessment - 08/23/23 1314        Breath   Bilateral Breath Sounds Clear    Shortness of Breath Yes;Limiting activity;Fear of Shortness of Breath          Exercise Target Goals: Exercise Program Goal: Individual exercise prescription set using results from initial 6 min walk test and THRR while considering  patient's activity barriers and safety.   Exercise Prescription Goal: Initial exercise prescription builds to 30-45 minutes a day of aerobic activity, 2-3 days per week.  Home exercise guidelines will be given to patient during program as part of exercise prescription that the participant will acknowledge.  Activity Barriers & Risk Stratification:  Activity Barriers & Cardiac Risk Stratification - 08/23/23 1313       Activity Barriers & Cardiac Risk Stratification   Activity Barriers Joint Problems;Deconditioning;Muscular Weakness;Arthritis;Other (comment);Back Problems;Balance Concerns    Comments chronic pain, right knee pain          6 Minute Walk:  6 Minute Walk     Row Name 08/23/23 1438         6 Minute Walk   Phase Initial     Distance 895 feet     Walk Time 6 minutes     # of Rest Breaks 1  pulse ox not registering :50-1:05     MPH 1.7     METS 2.66     RPE 10     Perceived Dyspnea  1     VO2 Peak 9.31     Symptoms Yes (comment)     Comments 1/10 chest tightness with rest     Resting HR 98 bpm     Resting BP 118/72  Resting Oxygen Saturation  97 %     Exercise Oxygen Saturation  during 6 min walk 96 %     Max Ex. HR 151 bpm     Max Ex. BP 116/70     2 Minute Post BP 122/74       Interval HR   1 Minute HR 132     2 Minute HR 127     3 Minute HR 137     4 Minute HR 142     5 Minute HR 143     6 Minute HR 151     2 Minute Post HR 118     Interval Heart Rate? Yes       Interval Oxygen   Interval Oxygen? Yes     Baseline Oxygen Saturation % 97 %     1 Minute Oxygen Saturation % 98 %     1 Minute Liters of Oxygen 0 L     2 Minute Oxygen Saturation % 95 %     2 Minute  Liters of Oxygen 0 L     3 Minute Oxygen Saturation % 96 %     3 Minute Liters of Oxygen 0 L     4 Minute Oxygen Saturation % 97 %     4 Minute Liters of Oxygen 0 L     5 Minute Oxygen Saturation % 97 %     5 Minute Liters of Oxygen 0 L     6 Minute Oxygen Saturation % 97 %     6 Minute Liters of Oxygen 0 L     2 Minute Post Oxygen Saturation % 98 %     2 Minute Post Liters of Oxygen 0 L        Oxygen Initial Assessment:  Oxygen Initial Assessment - 08/23/23 1314       Home Oxygen   Home Oxygen Device None    Sleep Oxygen Prescription None    Home Exercise Oxygen Prescription None    Home Resting Oxygen Prescription None      Initial 6 min Walk   Oxygen Used None      Program Oxygen Prescription   Program Oxygen Prescription None      Intervention   Short Term Goals To learn and understand importance of maintaining oxygen saturations>88%;To learn and understand importance of monitoring SPO2 with pulse oximeter and demonstrate accurate use of the pulse oximeter.;To learn and demonstrate proper pursed lip breathing techniques or other breathing techniques.     Long  Term Goals Maintenance of O2 saturations>88%;Verbalizes importance of monitoring SPO2 with pulse oximeter and return demonstration;Exhibits proper breathing techniques, such as pursed lip breathing or other method taught during program session          Oxygen Re-Evaluation:  Oxygen Re-Evaluation     Row Name 09/09/23 0755 10/03/23 0847           Program Oxygen Prescription   Program Oxygen Prescription None None        Home Oxygen   Home Oxygen Device None None      Sleep Oxygen Prescription None None      Home Exercise Oxygen Prescription None None      Home Resting Oxygen Prescription None None        Goals/Expected Outcomes   Short Term Goals To learn and understand importance of maintaining oxygen saturations>88%;To learn and understand importance of monitoring SPO2 with pulse oximeter and  demonstrate accurate use of the pulse oximeter.;To  learn and demonstrate proper pursed lip breathing techniques or other breathing techniques.  To learn and understand importance of maintaining oxygen saturations>88%;To learn and understand importance of monitoring SPO2 with pulse oximeter and demonstrate accurate use of the pulse oximeter.;To learn and demonstrate proper pursed lip breathing techniques or other breathing techniques.       Long  Term Goals Maintenance of O2 saturations>88%;Verbalizes importance of monitoring SPO2 with pulse oximeter and return demonstration;Exhibits proper breathing techniques, such as pursed lip breathing or other method taught during program session Maintenance of O2 saturations>88%;Verbalizes importance of monitoring SPO2 with pulse oximeter and return demonstration;Exhibits proper breathing techniques, such as pursed lip breathing or other method taught during program session      Goals/Expected Outcomes Compliance and understanding of oxygen saturation monitoring and breathing techniques to decrease shortness of breath. Compliance and understanding of oxygen saturation monitoring and breathing techniques to decrease shortness of breath.         Oxygen Discharge (Final Oxygen Re-Evaluation):  Oxygen Re-Evaluation - 10/03/23 0847       Program Oxygen Prescription   Program Oxygen Prescription None      Home Oxygen   Home Oxygen Device None    Sleep Oxygen Prescription None    Home Exercise Oxygen Prescription None    Home Resting Oxygen Prescription None      Goals/Expected Outcomes   Short Term Goals To learn and understand importance of maintaining oxygen saturations>88%;To learn and understand importance of monitoring SPO2 with pulse oximeter and demonstrate accurate use of the pulse oximeter.;To learn and demonstrate proper pursed lip breathing techniques or other breathing techniques.     Long  Term Goals Maintenance of O2 saturations>88%;Verbalizes  importance of monitoring SPO2 with pulse oximeter and return demonstration;Exhibits proper breathing techniques, such as pursed lip breathing or other method taught during program session    Goals/Expected Outcomes Compliance and understanding of oxygen saturation monitoring and breathing techniques to decrease shortness of breath.          Initial Exercise Prescription:  Initial Exercise Prescription - 08/23/23 1400       Date of Initial Exercise RX and Referring Provider   Date 08/23/23    Referring Provider Dewald    Expected Discharge Date 11/19/23      Recumbant Bike   Level 1    RPM 60    Minutes 15    METs 1.7      T5 Nustep   Level 1    SPM 60    Minutes 15    METs 1.7      Prescription Details   Frequency (times per week) 2    Duration Progress to 30 minutes of continuous aerobic without signs/symptoms of physical distress      Intensity   THRR 40-80% of Max Heartrate 75-150    Ratings of Perceived Exertion 11-13    Perceived Dyspnea 0-4      Progression   Progression Continue progressive overload as per policy without signs/symptoms or physical distress.      Resistance Training   Training Prescription Yes    Weight blue bands    Reps 10-15          Perform Capillary Blood Glucose checks as needed.  Exercise Prescription Changes:   Exercise Prescription Changes     Row Name 09/10/23 1500 09/24/23 1400 10/03/23 1514 10/08/23 1500       Response to Exercise   Blood Pressure (Admit) 122/66 118/80 118/70 --    Blood  Pressure (Exercise) 132/68 128/80 -- --    Blood Pressure (Exit) 116/80 112/60 100/76 --    Heart Rate (Admit) 97 bpm 108 bpm 104 bpm --    Heart Rate (Exercise) 119 bpm 117 bpm 110 bpm --    Heart Rate (Exit) 104 bpm 104 bpm 102 bpm --    Oxygen Saturation (Admit) 98 % 96 % 98 % --    Oxygen Saturation (Exercise) 97 % 98 % 95 % --    Oxygen Saturation (Exit) 96 % 97 % 97 % --    Rating of Perceived Exertion (Exercise) 11 11 12  --     Perceived Dyspnea (Exercise) 1 1 1  --    Duration Continue with 30 min of aerobic exercise without signs/symptoms of physical distress. Continue with 30 min of aerobic exercise without signs/symptoms of physical distress. Continue with 30 min of aerobic exercise without signs/symptoms of physical distress. --    Intensity THRR unchanged THRR unchanged THRR unchanged --      Progression   Progression Continue to progress workloads to maintain intensity without signs/symptoms of physical distress. Continue to progress workloads to maintain intensity without signs/symptoms of physical distress. Continue to progress workloads to maintain intensity without signs/symptoms of physical distress. --      Paramedic Prescription Yes Yes Yes --    Weight blue bands blue bands blue bands --    Reps 10-15 10-15 10-15 --    Time 10 Minutes 10 Minutes 10 Minutes --      Recumbant Bike   Level 1 -- -- --    Minutes 15 -- -- --    METs 1.9 -- -- --      T5 Nustep   Level 1 2 2  --    SPM 100 -- -- --    Minutes 15 30 30  --    METs 2.3 1.8 1.8 --       Exercise Comments:   Exercise Comments     Row Name 09/03/23 1535           Exercise Comments Pt completed her first day of exercise. She exercised on the recumbent stepper for 15 min, level 1, METs 1.7. She then exercised on the recumbent bike for 15 min, level 1, METs 1.7. She performed warm up and cool down with rest breaks. Unable to perform squats, did leg extensions instead. Discussed METs with good reception.          Exercise Goals and Review:   Exercise Goals     Row Name 08/23/23 1313             Exercise Goals   Increase Physical Activity Yes       Intervention Provide advice, education, support and counseling about physical activity/exercise needs.;Develop an individualized exercise prescription for aerobic and resistive training based on initial evaluation findings, risk stratification, comorbidities  and participant's personal goals.       Expected Outcomes Short Term: Attend rehab on a regular basis to increase amount of physical activity.;Long Term: Exercising regularly at least 3-5 days a week.;Long Term: Add in home exercise to make exercise part of routine and to increase amount of physical activity.       Increase Strength and Stamina Yes       Intervention Provide advice, education, support and counseling about physical activity/exercise needs.;Develop an individualized exercise prescription for aerobic and resistive training based on initial evaluation findings, risk stratification, comorbidities and participant's personal goals.  Expected Outcomes Short Term: Increase workloads from initial exercise prescription for resistance, speed, and METs.;Short Term: Perform resistance training exercises routinely during rehab and add in resistance training at home;Long Term: Improve cardiorespiratory fitness, muscular endurance and strength as measured by increased METs and functional capacity ( )       Able to understand and use rate of perceived exertion (RPE) scale Yes       Intervention Provide education and explanation on how to use RPE scale       Expected Outcomes Short Term: Able to use RPE daily in rehab to express subjective intensity level;Long Term:  Able to use RPE to guide intensity level when exercising independently       Able to understand and use Dyspnea scale Yes       Intervention Provide education and explanation on how to use Dyspnea scale       Expected Outcomes Short Term: Able to use Dyspnea scale daily in rehab to express subjective sense of shortness of breath during exertion;Long Term: Able to use Dyspnea scale to guide intensity level when exercising independently       Knowledge and understanding of Target Heart Rate Range (THRR) Yes       Intervention Provide education and explanation of THRR including how the numbers were predicted and where they are located for  reference       Expected Outcomes Short Term: Able to state/look up THRR;Short Term: Able to use daily as guideline for intensity in rehab;Long Term: Able to use THRR to govern intensity when exercising independently       Understanding of Exercise Prescription Yes       Intervention Provide education, explanation, and written materials on patient's individual exercise prescription       Expected Outcomes Short Term: Able to explain program exercise prescription;Long Term: Able to explain home exercise prescription to exercise independently          Exercise Goals Re-Evaluation :  Exercise Goals Re-Evaluation     Row Name 09/09/23 0755 10/03/23 0845           Exercise Goal Re-Evaluation   Exercise Goals Review Increase Physical Activity;Able to understand and use Dyspnea scale;Understanding of Exercise Prescription;Increase Strength and Stamina;Knowledge and understanding of Target Heart Rate Range (THRR);Able to understand and use rate of perceived exertion (RPE) scale Increase Physical Activity;Able to understand and use Dyspnea scale;Understanding of Exercise Prescription;Increase Strength and Stamina;Knowledge and understanding of Target Heart Rate Range (THRR);Able to understand and use rate of perceived exertion (RPE) scale      Comments Pt has completed 2 days of group exercise. She exercises on the recumbent stepper for 15 min, level 1, METs 2.1. She then exercises on the recumbent bike for 15 min, level 1, METs 1.9. She performs warm up and cool down with rest breaks. Unable to perform squats due to orthopedic issues, did leg extensions instead. Will progress as tolerated, she is severely deconditioned. Pt has completed 6 days of group exercise, missing 3 sessions for pain or illness. She exercises on the recumbent stepper for 15 min, level 2, METs 1.8. She then exercises on the recumbent bike for 15 min, level 2, METs 2. She has not began progressing in METs. She performs warm up and cool  down with rest breaks. Unable to perform squats due to orthopedic issues, did leg extensions instead. Uses blue bands, 5.8 lbs. Will progress as tolerated, she is severely deconditioned and has orthopedic limitations.  Expected Outcomes Through exercise at rehab and home, the patient will decrease shortness of breath and feel confident in carrying out an exercise regimen at home. Through exercise at rehab and home, the patient will decrease shortness of breath and feel confident in carrying out an exercise regimen at home.         Discharge Exercise Prescription (Final Exercise Prescription Changes):  Exercise Prescription Changes - 10/08/23 1500       Response to Exercise   Blood Pressure (Admit) --    Blood Pressure (Exit) --    Heart Rate (Admit) --    Heart Rate (Exercise) --    Heart Rate (Exit) --    Oxygen Saturation (Admit) --    Oxygen Saturation (Exercise) --    Oxygen Saturation (Exit) --    Rating of Perceived Exertion (Exercise) --    Perceived Dyspnea (Exercise) --    Duration --    Intensity --      Progression   Progression --      Resistance Training   Training Prescription --    Weight --    Reps --    Time --      T5 Nustep   Level --    SPM --    Minutes --    METs --          Nutrition:  Target Goals: Understanding of nutrition guidelines, daily intake of sodium 1500mg , cholesterol 200mg , calories 30% from fat and 7% or less from saturated fats, daily to have 5 or more servings of fruits and vegetables.  Biometrics:  Pre Biometrics - 08/23/23 1445       Pre Biometrics   Grip Strength 30 kg           Nutrition Therapy Plan and Nutrition Goals:  Nutrition Therapy & Goals - 10/03/23 1412       Nutrition Therapy   Diet General Healthy Diet      Personal Nutrition Goals   Nutrition Goal Patient to improve diet quality by using the plate method as a guide for meal planning to include lean protein/plant protein, fruits, vegetables,  whole grains, nonfat dairy as part of a well-balanced diet.   goal in progress.   Personal Goal #2 Patient to identify strategies for weight loss with goal of 0.5-2.0# per week.   goal not met.   Comments Goals not met. Kenyana has medical history of pulmonary sarcoidosis, HTN, OSA, fatty liver. Taquanna reports motivation to lose weight; have discussed strategies for weight loss including the plate method as a guide for meal planning, calorie density, increased non-starchy vegetable intake, etc. She has previously worked with [facility] but didn't feel like the diet plan was sustainable; she does report history of emotional eating. She lives alone and does enjoy a wide variety of foods but does admit current diet is rich in meat and potatoes. Liver enzymes remains elevated. She has maintained her weight since starting with our program. Patient will benefit from participation in pulmonary rehab for nutrition education, exercise, and lifestyle modification.      Intervention Plan   Intervention Prescribe, educate and counsel regarding individualized specific dietary modifications aiming towards targeted core components such as weight, hypertension, lipid management, diabetes, heart failure and other comorbidities.;Nutrition handout(s) given to patient.    Expected Outcomes Short Term Goal: Understand basic principles of dietary content, such as calories, fat, sodium, cholesterol and nutrients.;Long Term Goal: Adherence to prescribed nutrition plan.  Nutrition Assessments:  MEDIFICTS Score Key: >=70 Need to make dietary changes  40-70 Heart Healthy Diet <= 40 Therapeutic Level Cholesterol Diet   Picture Your Plate Scores: <59 Unhealthy dietary pattern with much room for improvement. 41-50 Dietary pattern unlikely to meet recommendations for good health and room for improvement. 51-60 More healthful dietary pattern, with some room for improvement.  >60 Healthy dietary  pattern, although there may be some specific behaviors that could be improved.    Nutrition Goals Re-Evaluation:  Nutrition Goals Re-Evaluation     Row Name 09/03/23 1448 10/03/23 1412           Goals   Current Weight 383 lb 2.6 oz (173.8 kg) 383 lb 13.1 oz (174.1 kg)      Comment A1c 4.3, AST 69 A1c 4.3, AST 69      Expected Outcome Tamzin has medical history of pulmonary sarcoidosis, HTN, OSA, fatty liver. Dearra reports motivation to lose weight; will continue to discuss strategies for weight loss including mindful eating, the plate method as a guide for meal planning, calorie density, etc. She has previously worked with [facility] but didn't feel like the diet plan was sustainable; she does report history of emotional eating. She lives alone and does enjoy a wide variety of foods but does admit current diet is rich in meat and potatoes. Liver enzymes remains elevated. Patient will benefit from participation in pulmonary rehab for nutrition education, exercise, and lifestyle modification. Goals not met. Korianna has medical history of pulmonary sarcoidosis, HTN, OSA, fatty liver. Shary reports motivation to lose weight; have discussed strategies for weight loss including the plate method as a guide for meal planning, calorie density, increased non-starchy vegetable intake, etc. She has previously worked with [facility] but didn't feel like the diet plan was sustainable; she does report history of emotional eating. She lives alone and does enjoy a wide variety of foods but does admit current diet is rich in meat and potatoes. Liver enzymes remains elevated. She has maintained her weight since starting with our program. Patient will benefit from participation in pulmonary rehab for nutrition education, exercise, and lifestyle modification.         Nutrition Goals Discharge (Final Nutrition Goals Re-Evaluation):  Nutrition Goals Re-Evaluation - 10/03/23 1412        Goals   Current Weight 383 lb 13.1 oz (174.1 kg)    Comment A1c 4.3, AST 69    Expected Outcome Goals not met. Dameshia has medical history of pulmonary sarcoidosis, HTN, OSA, fatty liver. Cheyenne reports motivation to lose weight; have discussed strategies for weight loss including the plate method as a guide for meal planning, calorie density, increased non-starchy vegetable intake, etc. She has previously worked with [facility] but didn't feel like the diet plan was sustainable; she does report history of emotional eating. She lives alone and does enjoy a wide variety of foods but does admit current diet is rich in meat and potatoes. Liver enzymes remains elevated. She has maintained her weight since starting with our program. Patient will benefit from participation in pulmonary rehab for nutrition education, exercise, and lifestyle modification.          Psychosocial: Target Goals: Acknowledge presence or absence of significant depression and/or stress, maximize coping skills, provide positive support system. Participant is able to verbalize types and ability to use techniques and skills needed for reducing stress and depression.  Initial Review & Psychosocial Screening:  Initial Psych  Review & Screening - 08/23/23 1315       Initial Review   Current issues with Current Anxiety/Panic;Current Depression;History of Depression;Current Psychotropic Meds;Current Stress Concerns    Source of Stress Concerns Chronic Illness;Financial    Comments Pt states she has had mental health issues since she was 33 years old. Pt stated she lost her mom at 48 and it's been her and her dad since then. Her dad was in the [employer] and she moved around a lot. She is currently under the care of a psychiatrist and sees a therapist weekly. She declines any additional needs or resources. Ilisa lives alone, has 2 cats, her dad lives in [place]. She has friends in the area and they get together weekly for game  nights.      Family Dynamics   Good Support System? Yes    Comments Her dad is her biggest support, also has friends who she can lean on      Barriers   Psychosocial barriers to participate in program Psychosocial barriers identified (see note)      Screening Interventions   Interventions Provide feedback about the scores to participant;Encouraged to exercise    Expected Outcomes Short Term goal: Utilizing psychosocial counselor, staff and physician to assist with identification of specific Stressors or current issues interfering with healing process. Setting desired goal for each stressor or current issue identified.;Long Term Goal: Stressors or current issues are controlled or eliminated.;Short Term goal: Identification and review with participant of any Quality of Life or Depression concerns found by scoring the questionnaire.;Long Term goal: The participant improves quality of Life and PHQ9 Scores as seen by post scores and/or verbalization of changes   psychiatrist every 3 months, therapist weekly         Quality of Life Scores:  Scores of 19 and below usually indicate a poorer quality of life in these areas.  A difference of  2-3 points is a clinically meaningful difference.  A difference of 2-3 points in the total score of the Quality of Life Index has been associated with significant improvement in overall quality of life, self-image, physical symptoms, and general health in studies assessing change in quality of life.  PHQ-9: Review Flowsheet  More data exists      08/23/2023 11/26/2022 06/20/2022 09/25/2021 11/16/2020  Depression screen PHQ 2/9  Decreased Interest 2 0 2 3 3   Down, Depressed, Hopeless 1 1 2 3 3   PHQ - 2 Score 3 1 4 6 6   Altered sleeping 3 1 3 3 3   Tired, decreased energy 3 3 3 3 3   Change in appetite 2 0 3 2 3   Feeling bad or failure about yourself  1 0 3 2 3   Trouble concentrating 2 0 2 3 -  Moving slowly or fidgety/restless 2 0 1 - 2  Suicidal thoughts 0 0 0  1 0  PHQ-9 Score 16 5 19 20 20   Difficult doing work/chores Very difficult Not difficult at all Somewhat difficult Extremely dIfficult -   Interpretation of Total Score  Total Score Depression Severity:  1-4 = Minimal depression, 5-9 = Mild depression, 10-14 = Moderate depression, 15-19 = Moderately severe depression, 20-27 = Severe depression   Psychosocial Evaluation and Intervention:  Psychosocial Evaluation - 08/23/23 1342       Psychosocial Evaluation & Interventions   Interventions Encouraged to exercise with the program and follow exercise prescription    Comments Pt states she has had mental health issues since she was 33  years old. Pt stated she lost her mom at 76 and it's been her and her dad since then. Her dad was in the [employer] and she moved around a lot. She is currently under the care of a psychiatrist and sees a therapist weekly. She declines any additional needs or resources. Destini lives alone, has 2 cats, her dad lives in [place]. She worries about her finances as she is not working and has applied for disability. She has friends in the area and they get together weekly for game nights.    Expected Outcomes For Aiyah to continue seeing her psychiatrist and therapist. To attend and participate in Pulm Rehab without any psy/soc barriers or concerns. To use positive coping mechanisms for her mental health.    Continue Psychosocial Services  Follow up required by staff          Psychosocial Re-Evaluation:  Psychosocial Re-Evaluation     Row Name 09/09/23 9077 10/07/23 1135           Psychosocial Re-Evaluation   Current issues with Current Depression;History of Depression;Current Anxiety/Panic;Current Psychotropic Meds;Current Stress Concerns Current Depression;History of Depression;Current Anxiety/Panic;Current Psychotropic Meds;Current Stress Concerns      Comments Psychosocial monthly re-evaluation is as follows: No changes since orientation. Honor has completed 2  classes so far. She denies any needs at this time Psychosocial monthly re-evaluation is as follows: Shira has denied needing any additional resources or referrals. She has an extensive mental health history and is compliant with taking her medications and seeing her therapist regularly.      Expected Outcomes For Maicie to continue seeing her psychiatrist and therapist. To attend and participate in Pulm Rehab without any psy/soc barriers or concerns. To use positive coping mechanisms for her mental health. For Antonae to continue seeing her psychiatrist and therapist. To attend and participate in Pulm Rehab without any psy/soc barriers or concerns. To use positive coping mechanisms for her mental health.      Interventions Encouraged to attend Pulmonary Rehabilitation for the exercise Encouraged to attend Pulmonary Rehabilitation for the exercise      Continue Psychosocial Services  Follow up required by staff Follow up required by staff         Psychosocial Discharge (Final Psychosocial Re-Evaluation):  Psychosocial Re-Evaluation - 10/07/23 1135       Psychosocial Re-Evaluation   Current issues with Current Depression;History of Depression;Current Anxiety/Panic;Current Psychotropic Meds;Current Stress Concerns    Comments Psychosocial monthly re-evaluation is as follows: Tnya has denied needing any additional resources or referrals. She has an extensive mental health history and is compliant with taking her medications and seeing her therapist regularly.    Expected Outcomes For Tikesha to continue seeing her psychiatrist and therapist. To attend and participate in Pulm Rehab without any psy/soc barriers or concerns. To use positive coping mechanisms for her mental health.    Interventions Encouraged to attend Pulmonary Rehabilitation for the exercise    Continue Psychosocial Services  Follow up required by staff          Education: Education Goals: Education classes will be provided on a  weekly basis, covering required topics. Participant will state understanding/return demonstration of topics presented.  Learning Barriers/Preferences:  Learning Barriers/Preferences - 08/23/23 1328       Learning Barriers/Preferences   Learning Barriers Sight    Learning Preferences Group Instruction;Individual Instruction;Verbal Instruction;Written Material          Education Topics: Know Your Numbers Group instruction that is supported by a PowerPoint  presentation. Instructor discusses importance of knowing and understanding resting, exercise, and post-exercise oxygen saturation, heart rate, and blood pressure. Oxygen saturation, heart rate, blood pressure, rating of perceived exertion, and dyspnea are reviewed along with a normal range for these values.    Exercise for the Pulmonary Patient Group instruction that is supported by a PowerPoint presentation. Instructor discusses benefits of exercise, core components of exercise, frequency, duration, and intensity of an exercise routine, importance of utilizing pulse oximetry during exercise, safety while exercising, and options of places to exercise outside of rehab.  Flowsheet Row PULMONARY REHAB OTHER RESPIRATORY from 09/19/2023 in [facility]  Date 09/19/23  Educator EP  Instruction Review Code 1- Verbalizes Understanding    MET Level  Group instruction provided by PowerPoint, verbal discussion, and written material to support subject matter. Instructor reviews what METs are and how to increase METs.    Pulmonary Medications Verbally interactive group education provided by instructor with focus on inhaled medications and proper administration. Flowsheet Row PULMONARY REHAB OTHER RESPIRATORY from 09/12/2023 in [facility]  Date 09/12/23  Educator RT  Instruction Review Code 1- Verbalizes Understanding    Anatomy  and Physiology of the Respiratory System Group instruction provided by PowerPoint, verbal discussion, and written material to support subject matter. Instructor reviews respiratory cycle and anatomical components of the respiratory system and their functions. Instructor also reviews differences in obstructive and restrictive respiratory diseases with examples of each.  Flowsheet Row PULMONARY REHAB OTHER RESPIRATORY from 09/05/2023 in [facility]  Date 09/05/23  Educator RT  Instruction Review Code 1- Verbalizes Understanding    Oxygen Safety Group instruction provided by PowerPoint, verbal discussion, and written material to support subject matter. There is an overview of "What is Oxygen" and "Why do we need it".  Instructor also reviews how to create a safe environment for oxygen use, the importance of using oxygen as prescribed, and the risks of noncompliance. There is a brief discussion on traveling with oxygen and resources the patient may utilize. Flowsheet Row PULMONARY REHAB OTHER RESPIRATORY from 10/03/2023 in [facility]  Date 10/03/23  Educator Nurse  Instruction Review Code 1- Verbalizes Understanding    Oxygen Use Group instruction provided by PowerPoint, verbal discussion, and written material to discuss how supplemental oxygen is prescribed and different types of oxygen supply systems. Resources for more information are provided.    Breathing Techniques Group instruction that is supported by demonstration and informational handouts. Instructor discusses the benefits of pursed lip and diaphragmatic breathing and detailed demonstration on how to perform both.     Risk Factor Reduction Group instruction that is supported by a PowerPoint presentation. Instructor discusses the definition of a risk factor, different risk factors for pulmonary disease, and how the heart  and lungs work together.   Pulmonary Diseases Group instruction provided by PowerPoint, verbal discussion, and written material to support subject matter. Instructor gives an overview of the different type of pulmonary diseases. There is also a discussion on risk factors and symptoms as well as ways to manage the diseases.   Stress and Energy Conservation Group instruction provided by PowerPoint, verbal discussion, and written material to support subject matter. Instructor gives an overview of stress and the impact it can have on the body. Instructor also reviews ways to reduce stress.  There is also a discussion on energy conservation and ways to conserve energy throughout the day.   Warning Signs and Symptoms Group instruction provided by PowerPoint, verbal discussion, and written material to support subject matter. Instructor reviews warning signs and symptoms of stroke, heart attack, cold and flu. Instructor also reviews ways to prevent the spread of infection.   Other Education Group or individual verbal, written, or video instructions that support the educational goals of the pulmonary rehab program.    Knowledge Questionnaire Score:  Knowledge Questionnaire Score - 08/23/23 1401       Knowledge Questionnaire Score   Pre Score 16/18          Core Components/Risk Factors/Patient Goals at Admission:  Personal Goals and Risk Factors at Admission - 10/07/23 1152       Core Components/Risk Factors/Patient Goals on Admission    Weight Management Yes;Obesity;Weight Loss    Intervention Weight Management: Develop a combined nutrition and exercise program designed to reach desired caloric intake, while maintaining appropriate intake of nutrient and fiber, sodium and fats, and appropriate energy expenditure required for the weight goal.;Weight Management: Provide education and appropriate resources to help participant work on and attain dietary goals.;Weight Management/Obesity:  Establish reasonable short term and long term weight goals.;Obesity: Provide education and appropriate resources to help participant work on and attain dietary goals.    Goal Weight: Short Term 373 lb (169.2 kg)    Goal Weight: Long Term 300 lb (136.1 kg)    Expected Outcomes Short Term: Continue to assess and modify interventions until short term weight is achieved;Long Term: Adherence to nutrition and physical activity/exercise program aimed toward attainment of established weight goal;Weight Loss: Understanding of general recommendations for a balanced deficit meal plan, which promotes 1-2 lb weight loss per week and includes a negative energy balance of [phone] kcal/d;Understanding recommendations for meals to include 15-35% energy as protein, 25-35% energy from fat, 35-60% energy from carbohydrates, less than 200mg  of dietary cholesterol, 20-35 gm of total fiber daily;Understanding of distribution of calorie intake throughout the day with the consumption of 4-5 meals/snacks    Improve shortness of breath with ADL's Yes    Intervention Provide education, individualized exercise plan and daily activity instruction to help decrease symptoms of SOB with activities of daily living.    Expected Outcomes Short Term: Improve cardiorespiratory fitness to achieve a reduction of symptoms when performing ADLs;Long Term: Be able to perform more ADLs without symptoms or delay the onset of symptoms          Core Components/Risk Factors/Patient Goals Review:   Goals and Risk Factor Review     Row Name 09/09/23 0924 10/07/23 1152           Core Components/Risk Factors/Patient Goals Review   Personal Goals Review Weight Management/Obesity;Improve shortness of breath with ADL's;Develop more efficient breathing techniques such as purse lipped breathing and diaphragmatic breathing and practicing self-pacing with activity. Weight Management/Obesity;Improve shortness of breath with ADL's;Develop more efficient  breathing techniques such as purse lipped breathing and diaphragmatic breathing and practicing self-pacing with activity.      Review Monthly review of patient's Core Components/Risk Factors/Patient Goals are as follows: Unable to assess goals yet. Analeigha has completed 2 classes so far. Monthly review of patient's Core Components/Risk Factors/Patient Goals are as follows: Goal not met on weight loss. Gemma has gained weight since beginning the program over a month ago. She continues to work with our dietitian on creating a calorie deficit. Goal progressing on  improving her shortness of breath with ADLs. Adaysha has completed 7 classes so far. She is not motivated to exercise and doesn't accept feedback from our exercise physiologist to increase her stamina. We will continue working with Waddell to achieve her goals and motivate her to continue exercising. Goal progressing on developing more efficient breathing techniques such as purse lipped breathing and diaphragmatic breathing; and practicing self-pacing with activity. Makeshia can pace herself with activity. We have begun working with her on pursed lip and diaphragmatic breathing techniques. We will continue to motivate and assess Aryel's goals throughout the program.      Expected Outcomes Pt will show progress toward meeting expected goals and outcomes. Pt will show progress toward meeting expected goals and outcomes.         Core Components/Risk Factors/Patient Goals at Discharge (Final Review):   Goals and Risk Factor Review - 10/07/23 1152       Core Components/Risk Factors/Patient Goals Review   Personal Goals Review Weight Management/Obesity;Improve shortness of breath with ADL's;Develop more efficient breathing techniques such as purse lipped breathing and diaphragmatic breathing and practicing self-pacing with activity.    Review Monthly review of patient's Core Components/Risk Factors/Patient Goals are as follows: Goal not met on weight loss.  Shakoya has gained weight since beginning the program over a month ago. She continues to work with our dietitian on creating a calorie deficit. Goal progressing on improving her shortness of breath with ADLs. Deirdre has completed 7 classes so far. She is not motivated to exercise and doesn't accept feedback from our exercise physiologist to increase her stamina. We will continue working with Waddell to achieve her goals and motivate her to continue exercising. Goal progressing on developing more efficient breathing techniques such as purse lipped breathing and diaphragmatic breathing; and practicing self-pacing with activity. Cassidey can pace herself with activity. We have begun working with her on pursed lip and diaphragmatic breathing techniques. We will continue to motivate and assess Siboney's goals throughout the program.    Expected Outcomes Pt will show progress toward meeting expected goals and outcomes.          ITP Comments:Pt is making expected progress toward Pulmonary Rehab goals after completing 7 session(s). Recommend continued exercise, life style modification, education, and utilization of breathing techniques to increase stamina and strength, while also decreasing shortness of breath with exertion.  Dr. Slater Staff is Medical Director for Pulmonary Rehab at [facility].

## 2023-10-10 ENCOUNTER — Telehealth (HOSPITAL_COMMUNITY): Payer: Self-pay

## 2023-10-10 ENCOUNTER — Encounter (HOSPITAL_COMMUNITY): Admission: RE | Admit: 2023-10-10 | Source: Ambulatory Visit

## 2023-10-10 ENCOUNTER — Ambulatory Visit: Payer: Self-pay | Admitting: Family

## 2023-10-10 ENCOUNTER — Other Ambulatory Visit: Payer: Self-pay | Admitting: Family

## 2023-10-10 DIAGNOSIS — R29898 Other symptoms and signs involving the musculoskeletal system: Secondary | ICD-10-CM

## 2023-10-10 DIAGNOSIS — M545 Low back pain, unspecified: Secondary | ICD-10-CM

## 2023-10-10 NOTE — Telephone Encounter (Signed)
 RN called pt about missing Pulm Rehab today. Pt stated her back pain is getting worse, was a 10/10 today and couldn't get out of bed. She stated she went to her PCP on the 8th and was Rx'd Vicodin. Plan decided that she will withdraw from the program until MRI is completed and back pain is controlled. Informed pt she will need a new referral placed from Dr. Kara. Pt understands without assistance.

## 2023-10-14 DIAGNOSIS — F4323 Adjustment disorder with mixed anxiety and depressed mood: Secondary | ICD-10-CM | POA: Diagnosis not present

## 2023-10-15 ENCOUNTER — Encounter (HOSPITAL_COMMUNITY)

## 2023-10-17 ENCOUNTER — Encounter (HOSPITAL_COMMUNITY)

## 2023-10-18 ENCOUNTER — Ambulatory Visit (HOSPITAL_BASED_OUTPATIENT_CLINIC_OR_DEPARTMENT_OTHER): Attending: Pulmonary Disease | Admitting: Pulmonary Disease

## 2023-10-18 DIAGNOSIS — G4733 Obstructive sleep apnea (adult) (pediatric): Secondary | ICD-10-CM | POA: Insufficient documentation

## 2023-10-21 DIAGNOSIS — F4323 Adjustment disorder with mixed anxiety and depressed mood: Secondary | ICD-10-CM | POA: Diagnosis not present

## 2023-10-22 ENCOUNTER — Encounter (HOSPITAL_COMMUNITY)

## 2023-10-24 ENCOUNTER — Encounter (HOSPITAL_COMMUNITY)

## 2023-10-24 NOTE — Procedures (Signed)
 [facility]ryle [facility] [address] [place], [place] [address] Tel: [phone]   Fax: [phone]  Titration Interpretation  Patient Name:  Margaret Golden, Margaret Golden Date:  10/18/2023 Referring Physician:  DORN CHILL [phone]) %%startinterp%% Indications for Polysomnography The patient is a 33 year-old Female who is 5' 7 and weighs 381.0 lbs. Her BMI equals 59.8.  A full night titration treatment study was performed. Baseline study is not available  Polysomnogram Data A full night polysomnogram recorded the standard physiologic parameters including EEG, EOG, EMG, EKG, nasal and oral airflow.  Respiratory parameters of chest and abdominal movements were recorded with Respiratory Inductance Plethysmography belts.  Oxygen saturation was recorded by pulse oximetry.   Sleep Architecture The total recording time of the polysomnogram was 401.2 minutes.  The total sleep time was 296.5 minutes.  The patient spent 1.7% of total sleep time in Stage N1, 49.6% in Stage N2, 21.6% in Stages N3, and 27.2% in REM.  Sleep latency was 36.5 minutes.  REM latency was 103.5 minutes.  Sleep Efficiency was 73.9%.  Wake after Sleep Onset time was 68.0 minutes.  Titration Summary The patient was titrated at pressures ranging from 6* cm/H20 up to 11* cm/H20 .  The last pressure used in the study was 11* cm/H20.  Respiratory Events The polysomnogram revealed a presence of - obstructive, 8 central, and - mixed apneas resulting in an Apnea index of 1.6 events per hour.  There were 9 hypopneas (>=3% desaturation and/or arousal) resulting in an Apnea\Hypopnea Index (AHI >=3% desaturation and/or arousal) of 3.4 events per hour.  There were 2 hypopneas (>=4% desaturation) resulting in an Apnea\Hypopnea Index (AHI >=4% desaturation) of 2.0 events per hour.  There were - Respiratory Effort Related Arousals resulting in a RERA index of - events per hour. The Respiratory Disturbance Index is 3.4  events per hour.  The snore index was - events per hour.  Mean oxygen saturation was 95.8%.  The lowest oxygen saturation during sleep was 89.0%.    Limb Activity There were - limb movements recorded.  Of this total, - were classified as PLMs.  Of the PLMs, - were associated with arousals.  The Limb Movement index was - per hour while the PLM index was - per hour.  Cardiac Summary The average pulse rate was 78.1 bpm.  The minimum pulse rate was 65.0 bpm while the maximum pulse rate was 101.0 bpm.  Cardiac rhythm was normal/abnormal.  Diagnosis: Presumed OSA  CPAP 11 cm was adequate  Recommendations: Initiate CPAP 11 cm with a small full face mask Close clinical follow up with compliance monitoring to optimize therapeutic efficiency Weight loss measures encouraged She should be cautioned against driving when sleepy & against medications with sedative side effects    This study was personally reviewed and electronically signed by: Jude Donning  MD Accredited Board Certified in Sleep Medicine

## 2023-10-28 DIAGNOSIS — F4323 Adjustment disorder with mixed anxiety and depressed mood: Secondary | ICD-10-CM | POA: Diagnosis not present

## 2023-10-29 ENCOUNTER — Ambulatory Visit: Payer: Self-pay | Admitting: Pulmonary Disease

## 2023-10-29 ENCOUNTER — Encounter (HOSPITAL_COMMUNITY)

## 2023-10-29 NOTE — Telephone Encounter (Signed)
 Please let patient know CPAP Titration Study results:  Please send DME order for CPAP machine and supplies with small full face mask with humidification. Initiate CPAP 11 cmH2O.  Thanks,  JD

## 2023-10-30 ENCOUNTER — Other Ambulatory Visit: Payer: Self-pay

## 2023-10-30 DIAGNOSIS — G4733 Obstructive sleep apnea (adult) (pediatric): Secondary | ICD-10-CM

## 2023-10-30 DIAGNOSIS — F4323 Adjustment disorder with mixed anxiety and depressed mood: Secondary | ICD-10-CM | POA: Diagnosis not present

## 2023-10-30 DIAGNOSIS — G4730 Sleep apnea, unspecified: Secondary | ICD-10-CM

## 2023-10-30 NOTE — Telephone Encounter (Signed)
 LM ok per DPR- order placed    NFN-

## 2023-10-31 ENCOUNTER — Encounter (HOSPITAL_COMMUNITY)

## 2023-11-05 ENCOUNTER — Encounter (HOSPITAL_COMMUNITY)

## 2023-11-06 DIAGNOSIS — F4323 Adjustment disorder with mixed anxiety and depressed mood: Secondary | ICD-10-CM | POA: Diagnosis not present

## 2023-11-07 ENCOUNTER — Encounter (HOSPITAL_COMMUNITY)

## 2023-11-08 ENCOUNTER — Ambulatory Visit (INDEPENDENT_AMBULATORY_CARE_PROVIDER_SITE_OTHER): Admitting: Family

## 2023-11-08 VITALS — BP 120/80 | HR 88 | Temp 98.8°F | Resp 16 | Ht 67.0 in | Wt 384.0 lb

## 2023-11-08 DIAGNOSIS — G8929 Other chronic pain: Secondary | ICD-10-CM

## 2023-11-08 DIAGNOSIS — D869 Sarcoidosis, unspecified: Secondary | ICD-10-CM

## 2023-11-08 DIAGNOSIS — G43909 Migraine, unspecified, not intractable, without status migrainosus: Secondary | ICD-10-CM | POA: Diagnosis not present

## 2023-11-08 DIAGNOSIS — I10 Essential (primary) hypertension: Secondary | ICD-10-CM | POA: Diagnosis not present

## 2023-11-08 DIAGNOSIS — E559 Vitamin D deficiency, unspecified: Secondary | ICD-10-CM

## 2023-11-08 DIAGNOSIS — M545 Low back pain, unspecified: Secondary | ICD-10-CM

## 2023-11-08 DIAGNOSIS — J45909 Unspecified asthma, uncomplicated: Secondary | ICD-10-CM

## 2023-11-08 DIAGNOSIS — Z23 Encounter for immunization: Secondary | ICD-10-CM | POA: Diagnosis not present

## 2023-11-08 DIAGNOSIS — E66813 Obesity, class 3: Secondary | ICD-10-CM

## 2023-11-08 DIAGNOSIS — F32A Depression, unspecified: Secondary | ICD-10-CM | POA: Diagnosis not present

## 2023-11-08 DIAGNOSIS — E038 Other specified hypothyroidism: Secondary | ICD-10-CM | POA: Diagnosis not present

## 2023-11-08 DIAGNOSIS — D500 Iron deficiency anemia secondary to blood loss (chronic): Secondary | ICD-10-CM

## 2023-11-08 DIAGNOSIS — Z6841 Body Mass Index (BMI) 40.0 and over, adult: Secondary | ICD-10-CM

## 2023-11-08 MED ORDER — RIZATRIPTAN BENZOATE 10 MG PO TABS
10.0000 mg | ORAL_TABLET | ORAL | 5 refills | Status: AC | PRN
Start: 2023-11-08 — End: ?

## 2023-11-08 MED ORDER — VITAMIN D (ERGOCALCIFEROL) 1.25 MG (50000 UNIT) PO CAPS: 50000.0000 [IU] | ORAL_CAPSULE | ORAL | 1 refills | Status: AC

## 2023-11-08 MED ORDER — ALBUTEROL SULFATE HFA 108 (90 BASE) MCG/ACT IN AERS: 2.0000 | INHALATION_SPRAY | Freq: Four times a day (QID) | RESPIRATORY_TRACT | 2 refills | Status: AC | PRN

## 2023-11-08 NOTE — Assessment & Plan Note (Signed)
 Reports mood is good.  On latuda /sertraline  per psychiatry.  Also working with a Veterinary surgeon.

## 2023-11-08 NOTE — Assessment & Plan Note (Signed)
 Stable on topamax . Does not have maxalt - refer.

## 2023-11-08 NOTE — Assessment & Plan Note (Signed)
 Lab Results  Component Value Date   TSH 4.86 02/26/2023   Clinically stable on synthroid . Update TSH.

## 2023-11-08 NOTE — Addendum Note (Signed)
 Addended by: WELLS LEVORN HERO on: 11/08/2023 04:37 PM   Modules accepted: Orders

## 2023-11-08 NOTE — Progress Notes (Signed)
 Subjective:     Patient ID: Margaret Golden, female    DOB: 1991/01/10, 33 y.o.   MRN: [id number]  No chief complaint on file.   HPI  Discussed the use of AI scribe software for clinical note transcription with the patient, who gave verbal consent to proceed.  History of Present Illness  Margaret Golden is a 33 year old female who presents for a medication review and follow-up.  Migraines occur occasionally and are less frequent. She takes Topamax  for prevention and is open to a triptan prescription.  Depression is managed with Latuda  and sertraline . Her mood is generally stable with occasional fluctuations due to situational factors. She is under the care of a counselor and psychiatrist.  Ankle arthritis causes pain, especially with rain. Back pain is sharp, particularly at night. She has not pursued recent care for these issues but was previously seen by a sports medicine specialist for her ankle. An MRI for back pain was approved but not scheduled due to contact issues with the imaging center.  Asthma symptoms occur during exercise, and she lacks an inhaler. Previous inhaler use was ineffective during sarcoidosis. Sarcoidosis is in remission, but chest pain and shortness of breath persist with extended exercise.  Propranolol  is taken for heart palpitations. Synthroid  is used for thyroid  management, with no adverse effects reported. Vitamin D  is taken twice weekly, and a refill may be needed. Anemia has improved with IUD use, and the last blood count was normal.     Health Maintenance Due  Topic Date Due   HPV VACCINES (1 - Risk 3-dose SCDM series) Never done   Cervical Cancer Screening (HPV/Pap Cotest)  05/18/2022   COVID-19 Vaccine (4 - Pfizer risk 2024-25 season) 09/27/2023   INFLUENZA VACCINE  11/01/2023    Past Medical History:  Diagnosis Date   Acid reflux    ADD (attention deficit disorder)    Anemia    Anxiety    Arthritis    left ankle   Back pain    Chest  pain    Complication of anesthesia    Depression    Dyspnea    with exertion   Exercise-induced asthma    as a child   Fatty liver    History of blood transfusion    History of chicken pox    History of kidney stones    passed    Hypothyroidism    Joint pain    Lower extremity edema    Major depressive disorder    Migraines    chronic   Palpitations    Pneumonia    x 2   PONV (postoperative nausea and vomiting)    nausea   RLS (restless legs syndrome)    Sarcoidosis of lung (HCC)    Sleep apnea 2021   Thyroid  disease    hypothyroid    Past Surgical History:  Procedure Laterality Date   BRONCHIAL NEEDLE ASPIRATION BIOPSY  03/31/2020   Procedure: BRONCHIAL NEEDLE ASPIRATION BIOPSIES;  Surgeon: Brenna Adine CROME, DO;  Location: [facility];  Service: Pulmonary;;   BRONCHIAL WASHINGS  03/31/2020   Procedure: BRONCHIAL WASHINGS;  Surgeon: Brenna Adine CROME, DO;  Location: [facility];  Service: Pulmonary;;   DILATATION & CURETTAGE/HYSTEROSCOPY WITH MYOSURE N/A 03/22/2020   Procedure: DILATATION & CURETTAGE/HYSTEROSCOPY WITH MYOSURE;  Surgeon: Jannis Kate Norris, MD;  Location: [facility] OR;  Service: Gynecology;  Laterality: N/A;   INTERCOSTAL NERVE BLOCK Right 06/27/2020   Procedure: INTERCOSTAL NERVE BLOCK;  Surgeon: Shyrl,  Linnie KIDD, MD;  Location: [facility] OR;  Service: Thoracic;  Laterality: Right;   INTRAUTERINE DEVICE (IUD) INSERTION N/A 03/22/2020   Procedure: INTRAUTERINE DEVICE (IUD) INSERTION;  Surgeon: Jannis Kate Norris, MD;  Location: [facility] OR;  Service: Gynecology;  Laterality: N/A;  Mirena  IUD insertion   KNEE ARTHROSCOPY Right 2007   LYMPH NODE BIOPSY Right 06/27/2020   Procedure: LYMPH NODE BIOPSY;  Surgeon: Shyrl Linnie KIDD, MD;  Location: [facility] OR;  Service: Thoracic;  Laterality: Right;   TONSILLECTOMY  2010   TONSILLECTOMY     VIDEO BRONCHOSCOPY WITH ENDOBRONCHIAL ULTRASOUND N/A 03/31/2020   Procedure: VIDEO BRONCHOSCOPY WITH ENDOBRONCHIAL ULTRASOUND;  Surgeon:  Brenna Adine CROME, DO;  Location: [facility];  Service: Pulmonary;  Laterality: N/A;   WISDOM TOOTH EXTRACTION     WISDOM TOOTH EXTRACTION Bilateral    upper and lower    Family History  Problem Relation Age of Onset   Depression Mother    Obesity Mother        died from an infection ? MRSA   Depression Father    Sleep apnea Father    Obesity Father    Arthritis Maternal Grandmother    Breast cancer Maternal Grandmother        in remission   Diabetes Maternal Grandfather    Arthritis Maternal Grandfather    Arthritis Paternal Grandmother    Arthritis Paternal Grandfather    Cancer Paternal Aunt        colon   Cancer Paternal Uncle        brain cancer   Migraines Neg Hx     Social History   Socioeconomic History   Marital status: Single    Spouse name: Not on file   Number of children: 0   Years of education: 12   Highest education level: Not on file  Occupational History   Occupation: Conservation officer, historic buildings  Tobacco Use   Smoking status: Never    Passive exposure: Never   Smokeless tobacco: Never  Vaping Use   Vaping status: Never Used  Substance and Sexual Activity   Alcohol use: Not Currently    Comment: rare social drink maybe once every 5 blue moons   Drug use: No   Sexual activity: Never    Comment: never sexually active  Other Topics Concern   Not on file  Social History Narrative   Lives alone    Seeking disability   Only child    Lives in a one story house, has 2 cats      Caffeine : 1 16oz can   Social Drivers of Corporate investment banker Strain: Not on file  Food Insecurity: Not on file  Transportation Needs: Not on file  Physical Activity: Not on file  Stress: Not on file  Social Connections: Not on file  Intimate Partner Violence: Not on file    Outpatient Medications Prior to Visit  Medication Sig Dispense Refill   aspirin -acetaminophen -caffeine  (EXCEDRIN  MIGRAINE) 250-250-65 MG tablet Take 2 tablets by mouth daily as needed  for headache or migraine.     clotrimazole -betamethasone  (LOTRISONE ) cream APPLY TOPICALLY TWICE DAILY TO ABDOMINAL FOLD FOR 14 DAYS 45 g 0   cyclobenzaprine  (FLEXERIL ) 5 MG tablet Take 1 tablet (5 mg total) by mouth 3 (three) times daily as needed for muscle spasms. 30 tablet 0   diphenhydrAMINE  HCl, Sleep, (ZZZQUIL PO) Take by mouth at bedtime as needed.     furosemide  (LASIX ) 20 MG tablet TAKE 1 TABLET BY MOUTH TWO TIMES  A WEEK 26 tablet 5   gabapentin  (NEURONTIN ) 300 MG capsule Take 1 capsule (300 mg total) by mouth 2 (two) times daily. 180 capsule 1   HYDROcodone -acetaminophen  (NORCO/VICODIN) 5-325 MG tablet Take 1 tablet by mouth every 6 (six) hours as needed for moderate pain (pain score 4-6). 15 tablet 0   Iron -FA-B Cmp-C-Biot-Probiotic (FUSION PLUS) CAPS One capsule twice daily with orange juice. Stop your Ferrous sulfate . 30 capsule 1   levonorgestrel  (MIRENA , 52 MG,) 20 MCG/DAY IUD 1 each by Intrauterine route once.     levothyroxine  (SYNTHROID ) 200 MCG tablet TAKE 1 TABLET BY MOUTH DAILY BEFORE BREAKFAST 90 tablet 0   LORazepam  (ATIVAN ) 0.5 MG tablet Take 1 tablet (0.5 mg total) by mouth every 8 (eight) hours as needed for anxiety. 30 tablet 4   lurasidone  (LATUDA ) 40 MG TABS tablet Take 1 tablet (40 mg total) by mouth daily with supper. 90 tablet 1   methocarbamol  (ROBAXIN ) 500 MG tablet Take 1 tablet (500 mg total) by mouth every 8 (eight) hours as needed for muscle spasms. 20 tablet 0   methylphenidate  (RITALIN ) 10 MG tablet Take 1/2-1 tablet daily as needed 30 tablet 0   Multiple Vitamins-Minerals (MULTIVITAMIN WITH MINERALS) tablet Take 1 tablet by mouth daily.     nystatin  (MYCOSTATIN /NYSTOP ) powder Apply 1 Application topically 2 (two) times daily as needed. 60 g 1   omeprazole  (PRILOSEC) 20 MG capsule TAKE 2 CAPSULES BY MOUTH DAILY 180 capsule 1   ondansetron  (ZOFRAN ) 4 MG tablet Take 1 tablet (4 mg total) by mouth every 8 (eight) hours as needed for nausea or vomiting. 20 tablet  0   ondansetron  (ZOFRAN -ODT) 4 MG disintegrating tablet Take 1 tablet (4 mg total) by mouth every 8 (eight) hours as needed for nausea or vomiting. 20 tablet 0   potassium chloride  SA (KLOR-CON ) 20 MEQ tablet Take 1 tablet (20 mEq total) by mouth 2 (two) times a week. 26 tablet 0   propranolol  (INDERAL ) 20 MG tablet TAKE 1 TABLET (20 MG TOTAL) BY MOUTH 3 (THREE) TIMES DAILY. 270 tablet 3   sertraline  (ZOLOFT ) 100 MG tablet Take 2 tablets (200 mg total) by mouth daily. 180 tablet 1   topiramate  (TOPAMAX ) 50 MG tablet Take 2 tablets (100 mg total) by mouth at bedtime. TAKE 1 TABLET BY MOUTH EVERY NIGHT AT BEDTIME FOR 7 DAYS THEN INCREASE TO 2 TABLETS BY MOUTH EVERY NIGHT AT BEDTIME 180 tablet 0   albuterol  (VENTOLIN  HFA) 108 (90 Base) MCG/ACT inhaler Inhale 2 puffs into the lungs every 6 (six) hours as needed for wheezing or shortness of breath. 8 g 2   rizatriptan  (MAXALT ) 10 MG tablet Take 1 tablet (10 mg total) by mouth as needed for migraine. May repeat in 2 hours if needed 10 tablet 0   Vitamin D , Ergocalciferol , (DRISDOL ) 1.25 MG (50000 UNIT) CAPS capsule Take 1 capsule (50,000 Units total) by mouth 2 (two) times a week. 12 capsule 0   folic acid  (FOLVITE ) 1 MG tablet TAKE 2 TABLETS BY MOUTH DAILY (Patient not taking: Reported on 10/08/2023) 60 tablet 3   methotrexate  (RHEUMATREX) 2.5 MG tablet TAKE 6 TABLETS BY MOUTH ONCE A WEEK; PROTECT MEDICINE FROM LIGHT (Patient not taking: Reported on 10/08/2023) 72 tablet 1   methylPREDNISolone  (MEDROL  DOSEPAK) 4 MG TBPK tablet Take per package instructions 21 tablet 0   No facility-administered medications prior to visit.    Allergies  Allergen Reactions   Augmentin [Amoxicillin -Pot Clavulanate] Hives    ROS See HPI  Objective:    Physical Exam Constitutional:      General: She is not in acute distress.    Appearance: Normal appearance. She is well-developed. She is obese.  HENT:     Head: Normocephalic and atraumatic.     Right Ear:  External ear normal.     Left Ear: External ear normal.  Eyes:     General: No scleral icterus. Neck:     Thyroid : No thyromegaly.  Cardiovascular:     Rate and Rhythm: Normal rate and regular rhythm.     Heart sounds: Normal heart sounds. No murmur heard. Pulmonary:     Effort: Pulmonary effort is normal. No respiratory distress.     Breath sounds: Normal breath sounds. No wheezing.  Musculoskeletal:     Cervical back: Neck supple.  Skin:    General: Skin is warm and dry.  Neurological:     Mental Status: She is alert and oriented to person, place, and time.  Psychiatric:        Mood and Affect: Mood normal.        Behavior: Behavior normal.        Thought Content: Thought content normal.        Judgment: Judgment normal.      BP 120/80 (BP Location: Right Arm, Patient Position: Sitting, Cuff Size: Large)   Pulse 88   Temp 98.8 F (37.1 C) (Oral)   Resp 16   Ht 5' 7 (1.702 m)   Wt (!) 384 lb (174.2 kg)   SpO2 98%   BMI 60.14 kg/m  Wt Readings from Last 3 Encounters:  11/08/23 (!) 384 lb (174.2 kg)  10/18/23 (!) 381 lb (172.8 kg)  10/08/23 (!) 381 lb (172.8 kg)       Assessment & Plan:   Problem List Items Addressed This Visit       Unprioritized   Uncomplicated asthma   Relevant Medications   albuterol  (VENTOLIN  HFA) 108 (90 Base) MCG/ACT inhaler   Sarcoidosis   Pt states that she has been told by her pulmonologist that this is in remission.       Migraines   Stable on topamax . Does not have maxalt - refer.      Relevant Medications   rizatriptan  (MAXALT ) 10 MG tablet   IDA (iron  deficiency anemia)   Lab Results  Component Value Date   WBC 8.9 05/09/2023   HGB 14.4 05/09/2023   HCT 43.2 05/09/2023   MCV 92.0 05/09/2023   PLT 161.0 05/09/2023   Resolved with IUD and decreased menstrual loss.        Hypothyroidism   Lab Results  Component Value Date   TSH 4.86 02/26/2023   Clinically stable on synthroid . Update TSH.      Relevant  Orders   TSH   Essential hypertension   BP Readings from Last 3 Encounters:  11/08/23 120/80  10/08/23 132/74  10/01/23 122/78   Only on propranolol  for palpitations. Stable BP.       Relevant Orders   Basic Metabolic Panel (BMET)   Depression - Primary   Reports mood is good.  On latuda /sertraline  per psychiatry.  Also working with a Veterinary surgeon.       Back pain   Advised pt to call to schedule MRI.       Other Visit Diagnoses       Vitamin D  deficiency       Relevant Medications   Vitamin D , Ergocalciferol , (DRISDOL ) 1.25 MG (50000 UNIT) CAPS capsule (  Start on 11/11/2023)   Other Relevant Orders   Vitamin D  (25 hydroxy)     Class 3 severe obesity due to excess calories with serious comorbidity and body mass index (BMI) of 60.0 to 69.9 in adult       Relevant Orders   Amb Ref to Medical Weight Management       I have discontinued Blake A. Moncur's methotrexate , folic acid , and methylPREDNISolone . I am also having her maintain her multivitamin with minerals, Fusion Plus, potassium chloride  SA, aspirin -acetaminophen -caffeine , furosemide , Mirena  (52 MG), (diphenhydrAMINE  HCl, Sleep, (ZZZQUIL PO)), ondansetron , propranolol , nystatin , gabapentin , LORazepam , lurasidone , methylphenidate , sertraline , ondansetron , methocarbamol , clotrimazole -betamethasone , omeprazole , topiramate , cyclobenzaprine , HYDROcodone -acetaminophen , levothyroxine , rizatriptan , albuterol , and Vitamin D  (Ergocalciferol ).  Meds ordered this encounter  Medications   rizatriptan  (MAXALT ) 10 MG tablet    Sig: Take 1 tablet (10 mg total) by mouth as needed for migraine. May repeat in 2 hours if needed    Dispense:  10 tablet    Refill:  5    Supervising Provider:   DOMENICA BLACKBIRD A [[id number]]   albuterol  (VENTOLIN  HFA) 108 (90 Base) MCG/ACT inhaler    Sig: Inhale 2 puffs into the lungs every 6 (six) hours as needed for wheezing or shortness of breath.    Dispense:  8 g    Refill:  2    Supervising Provider:    DOMENICA BLACKBIRD A [[id number]]   Vitamin D , Ergocalciferol , (DRISDOL ) 1.25 MG (50000 UNIT) CAPS capsule    Sig: Take 1 capsule (50,000 Units total) by mouth 2 (two) times a week.    Dispense:  12 capsule    Refill:  1    Supervising Provider:   DOMENICA BLACKBIRD A [[id number]]

## 2023-11-08 NOTE — Assessment & Plan Note (Signed)
 BP Readings from Last 3 Encounters:  11/08/23 120/80  10/08/23 132/74  10/01/23 122/78   Only on propranolol  for palpitations. Stable BP.

## 2023-11-08 NOTE — Assessment & Plan Note (Signed)
 Lab Results  Component Value Date   WBC 8.9 05/09/2023   HGB 14.4 05/09/2023   HCT 43.2 05/09/2023   MCV 92.0 05/09/2023   PLT 161.0 05/09/2023   Resolved with IUD and decreased menstrual loss.

## 2023-11-08 NOTE — Assessment & Plan Note (Signed)
 Pt states that she has been told by her pulmonologist that this is in remission.

## 2023-11-08 NOTE — Patient Instructions (Addendum)
 Please call [facility] to schedule your MRI:  [phone]  VISIT SUMMARY:  Today, we reviewed your medications and discussed several health concerns, including migraines, depression, ankle arthritis, back pain, asthma, sarcoidosis, and weight management. We also addressed your thyroid  management, vitamin D  levels, and heart palpitations.  YOUR PLAN:  MIGRAINES: Your migraines are less frequent with Topamax , but you still experience occasional episodes. -Prescribe Maxalt  (rizatriptan ) as a rescue medication for migraines.  CHRONIC LOW BACK PAIN: You have ongoing back pain, especially at night. An MRI was approved but not scheduled due to contact issues. -Provide contact information for [facility] to schedule MRI. -Recertify MRI approval if needed.  ANKLE OSTEOARTHRITIS: You experience ankle pain, especially when it rains, due to arthritis. -No current follow-up with a specialist is needed.  EXERCISE-INDUCED ASTHMA: You have asthma symptoms during exercise and currently do not have an inhaler. -Prescribe albuterol  inhaler for use as needed.  SARCOIDOSIS: Your sarcoidosis is in remission, but you still have chest pain and shortness of breath with extended exercise. -Continue monitoring symptoms.  MAJOR DEPRESSIVE DISORDER: Your mood is generally stable with occasional fluctuations. You are on sertraline  and have behavioral health follow-up. -Continue current medications and follow-up with your counselor and psychiatrist.  OBESITY: Your weight fluctuates despite your efforts. We discussed weight loss options. -Refer to weight loss clinic. -Discuss over-the-counter orlistat as a weight loss option, noting potential for diarrhea.  HYPOTHYROIDISM: Your hypothyroidism is well-managed on levothyroxine  with no reported side effects. -Continue current medication.  VITAMIN D  DEFICIENCY: Your vitamin D  levels are being monitored, and you need a refill. -Check vitamin D   level. -Prescribe vitamin D  refill.  PALPITATIONS: You are using propranolol  for heart palpitations. -Continue current medication.

## 2023-11-08 NOTE — Assessment & Plan Note (Signed)
>>  ASSESSMENT AND PLAN FOR DEPRESSION WRITTEN ON 11/08/2023  3:36 PM BY O'SULLIVAN, Marcellus Pulliam, NP  Reports mood is good.  On latuda /sertraline  per psychiatry.  Also working with a Veterinary surgeon.

## 2023-11-08 NOTE — Assessment & Plan Note (Signed)
 Advised pt to call to schedule MRI.

## 2023-11-09 LAB — TSH: TSH: 7.09 m[IU]/L — ABNORMAL HIGH

## 2023-11-09 LAB — BASIC METABOLIC PANEL WITH GFR
BUN: 11 mg/dL (ref 7–25)
CO2: 22 mmol/L (ref 20–32)
Calcium: 8.3 mg/dL — ABNORMAL LOW (ref 8.6–10.2)
Chloride: 108 mmol/L (ref 98–110)
Creat: 0.88 mg/dL (ref 0.50–0.97)
Glucose, Bld: 132 mg/dL — ABNORMAL HIGH (ref 65–99)
Potassium: 3.7 mmol/L (ref 3.5–5.3)
Sodium: 141 mmol/L (ref 135–146)
eGFR: 89 mL/min/1.73m2 (ref >=60)

## 2023-11-09 LAB — VITAMIN D 25 HYDROXY (VIT D DEFICIENCY, FRACTURES): Vit D, 25-Hydroxy: 28 ng/mL — ABNORMAL LOW (ref 30–100)

## 2023-11-09 LAB — EXTRA LAV TOP TUBE

## 2023-11-12 ENCOUNTER — Encounter (HOSPITAL_COMMUNITY)

## 2023-11-12 DIAGNOSIS — F4323 Adjustment disorder with mixed anxiety and depressed mood: Secondary | ICD-10-CM | POA: Diagnosis not present

## 2023-11-14 ENCOUNTER — Encounter (HOSPITAL_COMMUNITY)

## 2023-11-19 ENCOUNTER — Encounter (HOSPITAL_COMMUNITY)

## 2023-11-19 DIAGNOSIS — F4323 Adjustment disorder with mixed anxiety and depressed mood: Secondary | ICD-10-CM | POA: Diagnosis not present

## 2023-11-24 ENCOUNTER — Other Ambulatory Visit: Payer: Self-pay | Admitting: Family

## 2023-11-24 DIAGNOSIS — G43809 Other migraine, not intractable, without status migrainosus: Secondary | ICD-10-CM

## 2023-11-25 DIAGNOSIS — F4323 Adjustment disorder with mixed anxiety and depressed mood: Secondary | ICD-10-CM | POA: Diagnosis not present

## 2023-12-05 DIAGNOSIS — F330 Major depressive disorder, recurrent, mild: Secondary | ICD-10-CM | POA: Diagnosis not present

## 2023-12-05 DIAGNOSIS — F411 Generalized anxiety disorder: Secondary | ICD-10-CM | POA: Diagnosis not present

## 2023-12-05 DIAGNOSIS — G4700 Insomnia, unspecified: Secondary | ICD-10-CM | POA: Diagnosis not present

## 2023-12-05 DIAGNOSIS — F909 Attention-deficit hyperactivity disorder, unspecified type: Secondary | ICD-10-CM | POA: Diagnosis not present

## 2023-12-09 DIAGNOSIS — F4323 Adjustment disorder with mixed anxiety and depressed mood: Secondary | ICD-10-CM | POA: Diagnosis not present

## 2023-12-16 DIAGNOSIS — F4323 Adjustment disorder with mixed anxiety and depressed mood: Secondary | ICD-10-CM | POA: Diagnosis not present

## 2023-12-23 DIAGNOSIS — F4323 Adjustment disorder with mixed anxiety and depressed mood: Secondary | ICD-10-CM | POA: Diagnosis not present

## 2023-12-24 ENCOUNTER — Ambulatory Visit (INDEPENDENT_AMBULATORY_CARE_PROVIDER_SITE_OTHER): Admitting: Family

## 2023-12-24 ENCOUNTER — Encounter: Payer: Self-pay | Admitting: Family

## 2023-12-24 VITALS — BP 133/89 | HR 88 | Temp 98.9°F | Resp 16 | Ht 67.0 in | Wt 384.0 lb

## 2023-12-24 DIAGNOSIS — E782 Mixed hyperlipidemia: Secondary | ICD-10-CM

## 2023-12-24 DIAGNOSIS — G43809 Other migraine, not intractable, without status migrainosus: Secondary | ICD-10-CM

## 2023-12-24 DIAGNOSIS — G4733 Obstructive sleep apnea (adult) (pediatric): Secondary | ICD-10-CM

## 2023-12-24 DIAGNOSIS — E038 Other specified hypothyroidism: Secondary | ICD-10-CM

## 2023-12-24 DIAGNOSIS — D869 Sarcoidosis, unspecified: Secondary | ICD-10-CM

## 2023-12-24 DIAGNOSIS — Z0001 Encounter for general adult medical examination with abnormal findings: Secondary | ICD-10-CM | POA: Diagnosis not present

## 2023-12-24 DIAGNOSIS — D649 Anemia, unspecified: Secondary | ICD-10-CM

## 2023-12-24 DIAGNOSIS — R1310 Dysphagia, unspecified: Secondary | ICD-10-CM

## 2023-12-24 DIAGNOSIS — E039 Hypothyroidism, unspecified: Secondary | ICD-10-CM | POA: Diagnosis not present

## 2023-12-24 DIAGNOSIS — Z0000 Encounter for general adult medical examination without abnormal findings: Secondary | ICD-10-CM

## 2023-12-24 DIAGNOSIS — G2581 Restless legs syndrome: Secondary | ICD-10-CM

## 2023-12-24 DIAGNOSIS — H4710 Unspecified papilledema: Secondary | ICD-10-CM

## 2023-12-24 NOTE — Assessment & Plan Note (Signed)
 Lab Results  Component Value Date   WBC 8.9 05/09/2023   HGB 14.4 05/09/2023   HCT 43.2 05/09/2023   MCV 92.0 05/09/2023   PLT 161.0 05/09/2023  Resolved following mirena  placement.

## 2023-12-24 NOTE — Assessment & Plan Note (Signed)
 Lab Results  Component Value Date   CHOL 150 09/03/2019   HDL 39 (L) 09/03/2019   LDLCALC 89 09/03/2019   TRIG 120 09/03/2019   CHOLHDL 4 07/21/2019   Update lipid panel.

## 2023-12-24 NOTE — Assessment & Plan Note (Signed)
  Routine adult wellness visit. Discussed vaccinations and screenings. - Schedule second Gardasil vaccine for 10/8 or later. - Administer flu shot during the next visit. - Schedule Pap smear with GYN. - Schedule eye exam. - Schedule dental appointment.

## 2023-12-24 NOTE — Assessment & Plan Note (Addendum)
 Had previously normal MRI back in 2021, due for follow up eye exam- pt will schedule.

## 2023-12-24 NOTE — Assessment & Plan Note (Signed)
In remission per patient 

## 2023-12-24 NOTE — Assessment & Plan Note (Signed)
Stable on gabapentin, continue same.  

## 2023-12-24 NOTE — Progress Notes (Signed)
 Subjective:     Patient ID: Margaret Golden, female    DOB: Jan 25, 1991, 33 y.o.   MRN: [id number]  Chief Complaint  Patient presents with   Immunizations    Due for gardasil #2   Annual Exam    HPI  Discussed the use of AI scribe software for clinical note transcription with the patient, who gave verbal consent to proceed.  History of Present Illness  Margaret Golden is a 33 year old female who presents for an annual physical exam.  She is due for her second Gardasil vaccine on October 8th and plans to receive her flu shot at the same time. Her last Pap smear was in February 2021, and she needs to schedule her yearly exam. She is not up to date on her eye exam and dental check-up, with the last dental visit about a year ago.  Dietary challenges arise from difficulty standing in the kitchen, leading to reliance on microwave or oven meals, though she finds using a crock pot helpful. Exercise is limited due to ankle issues, and she lacks access to a pool for aquatic exercises.  She experiences occasional tinnitus, leg swelling alleviated by compression socks, and occasional dysphagia. Omeprazole  40 mg daily controls her heartburn unless a dose is missed.  She has an IUD with regular periods, the last one over two weeks ago. Nearly daily headaches occur, with migraines reduced by Topamax . Excedrin  is used for severe headaches, which may have evolved into tension headaches.  Her mood is affected by the time of year, with anniversaries of her mother's and friend's deaths approaching, leading to increased depression. She takes sertraline  200 mg daily and feels stable outside of these anniversaries, supported by a group.  She has sleep apnea but has not found a suitable CPAP mask. Neck pain occurs upon waking, attributed to sleeping positions. She follows up with a pulmonologist for sleep apnea.  She is on levothyroxine  200 mcg daily, recently increased from 175 mcg due to high TSH  levels. She is due for blood work next month to check her anemia status, which has improved since starting Mirena .  Immunizations: will return in October for second gardisil/flu Diet:  cooking some more Exercise: limited due to ankle pain Pap Smear: due will schedule with GYN [facility]: due- will schedule Dental: due will schedule      Health Maintenance Due  Topic Date Due   Cervical Cancer Screening (HPV/Pap Cotest)  05/18/2022   Influenza Vaccine  11/01/2023   COVID-19 Vaccine (4 - Pfizer risk 2024-25 season) 12/02/2023   HPV VACCINES (2 - Risk 3-dose series) 12/06/2023    Past Medical History:  Diagnosis Date   Acid reflux    ADD (attention deficit disorder)    Anemia    Anxiety    Arthritis    left ankle   Back pain    Chest pain    Complication of anesthesia    Depression    Dyspnea    with exertion   Exercise-induced asthma    as a child   Fatty liver    History of blood transfusion    History of chicken pox    History of chicken pox    History of kidney stones    passed    Hypothyroidism    Joint pain    Lower extremity edema    Major depressive disorder    Migraines    chronic   Palpitations    Pneumonia  x 2   PONV (postoperative nausea and vomiting)    nausea   RLS (restless legs syndrome)    Sarcoidosis of lung    Sleep apnea 2021   Thyroid  disease    hypothyroid    Past Surgical History:  Procedure Laterality Date   BRONCHIAL NEEDLE ASPIRATION BIOPSY  03/31/2020   Procedure: BRONCHIAL NEEDLE ASPIRATION BIOPSIES;  Surgeon: Brenna Adine CROME, DO;  Location: [facility];  Service: Pulmonary;;   BRONCHIAL WASHINGS  03/31/2020   Procedure: BRONCHIAL WASHINGS;  Surgeon: Brenna Adine CROME, DO;  Location: [facility];  Service: Pulmonary;;   DILATATION & CURETTAGE/HYSTEROSCOPY WITH MYOSURE N/A 03/22/2020   Procedure: DILATATION & CURETTAGE/HYSTEROSCOPY WITH MYOSURE;  Surgeon: Jannis Kate Norris, MD;  [facility];  Service: Gynecology;   Laterality: N/A;   INTERCOSTAL NERVE BLOCK Right 06/27/2020   Procedure: INTERCOSTAL NERVE BLOCK;  Surgeon: Shyrl Linnie KIDD, MD;  Location: [facility] OR;  Service: Thoracic;  Laterality: Right;   INTRAUTERINE DEVICE (IUD) INSERTION N/A 03/22/2020   Procedure: INTRAUTERINE DEVICE (IUD) INSERTION;  Surgeon: Jannis Kate Norris, MD;  Location: [facility] OR;  Service: Gynecology;  Laterality: N/A;  Mirena  IUD insertion   KNEE ARTHROSCOPY Right 2007   LYMPH NODE BIOPSY Right 06/27/2020   Procedure: LYMPH NODE BIOPSY;  Surgeon: Shyrl Linnie KIDD, MD;  Location: [facility] OR;  Service: Thoracic;  Laterality: Right;   TONSILLECTOMY  2010   TONSILLECTOMY     VIDEO BRONCHOSCOPY WITH ENDOBRONCHIAL ULTRASOUND N/A 03/31/2020   Procedure: VIDEO BRONCHOSCOPY WITH ENDOBRONCHIAL ULTRASOUND;  Surgeon: Brenna Adine CROME, DO;  Location: [facility];  Service: Pulmonary;  Laterality: N/A;   WISDOM TOOTH EXTRACTION     WISDOM TOOTH EXTRACTION Bilateral    upper and lower    Family History  Problem Relation Age of Onset   Depression Mother    Obesity Mother        died from an infection ? MRSA   Depression Father    Sleep apnea Father    Obesity Father    Arthritis Maternal Grandmother    Breast cancer Maternal Grandmother        in remission   Diabetes Maternal Grandfather    Arthritis Maternal Grandfather    Arthritis Paternal Grandmother    Arthritis Paternal Grandfather    Cancer Paternal Aunt        colon   Cancer Paternal Uncle        brain cancer   Migraines Neg Hx     Social History   Socioeconomic History   Marital status: Single    Spouse name: Not on file   Number of children: 0   Years of education: 12   Highest education level: Not on file  Occupational History   Occupation: Conservation officer, historic buildings  Tobacco Use   Smoking status: Never    Passive exposure: Never   Smokeless tobacco: Never  Vaping Use   Vaping status: Never Used  Substance and Sexual Activity   Alcohol use: Not  Currently    Comment: rare social drink maybe once every 5 blue moons   Drug use: No   Sexual activity: Never    Comment: never sexually active  Other Topics Concern   Not on file  Social History Narrative   Lives alone    Seeking disability   Only child    Lives in a one story house, has 2 cats   Caffeine : 1 16oz can   Social Drivers of Corporate investment banker Strain: Not on  file  Food Insecurity: Not on file  Transportation Needs: Not on file  Physical Activity: Not on file  Stress: Not on file  Social Connections: Not on file  Intimate Partner Violence: Not on file    Outpatient Medications Prior to Visit  Medication Sig Dispense Refill   albuterol  (VENTOLIN  HFA) 108 (90 Base) MCG/ACT inhaler Inhale 2 puffs into the lungs every 6 (six) hours as needed for wheezing or shortness of breath. 8 g 2   aspirin -acetaminophen -caffeine  (EXCEDRIN  MIGRAINE) 250-250-65 MG tablet Take 2 tablets by mouth daily as needed for headache or migraine.     clotrimazole -betamethasone  (LOTRISONE ) cream APPLY TOPICALLY TWICE DAILY TO ABDOMINAL FOLD FOR 14 DAYS 45 g 0   cyclobenzaprine  (FLEXERIL ) 5 MG tablet Take 1 tablet (5 mg total) by mouth 3 (three) times daily as needed for muscle spasms. 30 tablet 0   diphenhydrAMINE  HCl, Sleep, (ZZZQUIL PO) Take by mouth at bedtime as needed.     furosemide  (LASIX ) 20 MG tablet TAKE 1 TABLET BY MOUTH TWO TIMES A WEEK 26 tablet 5   gabapentin  (NEURONTIN ) 300 MG capsule Take 1 capsule (300 mg total) by mouth 2 (two) times daily. 180 capsule 1   HYDROcodone -acetaminophen  (NORCO/VICODIN) 5-325 MG tablet Take 1 tablet by mouth every 6 (six) hours as needed for moderate pain (pain score 4-6). 15 tablet 0   Iron -FA-B Cmp-C-Biot-Probiotic (FUSION PLUS) CAPS One capsule twice daily with orange juice. Stop your Ferrous sulfate . 30 capsule 1   levonorgestrel  (MIRENA , 52 MG,) 20 MCG/DAY IUD 1 each by Intrauterine route once.     levothyroxine  (SYNTHROID ) 200 MCG  tablet TAKE 1 TABLET BY MOUTH DAILY BEFORE BREAKFAST 90 tablet 0   LORazepam  (ATIVAN ) 0.5 MG tablet Take 1 tablet (0.5 mg total) by mouth every 8 (eight) hours as needed for anxiety. 30 tablet 4   lurasidone  (LATUDA ) 40 MG TABS tablet Take 1 tablet (40 mg total) by mouth daily with supper. 90 tablet 1   methocarbamol  (ROBAXIN ) 500 MG tablet Take 1 tablet (500 mg total) by mouth every 8 (eight) hours as needed for muscle spasms. 20 tablet 0   methylphenidate  (RITALIN ) 10 MG tablet Take 1/2-1 tablet daily as needed 30 tablet 0   Multiple Vitamins-Minerals (MULTIVITAMIN WITH MINERALS) tablet Take 1 tablet by mouth daily.     nystatin  (MYCOSTATIN /NYSTOP ) powder Apply 1 Application topically 2 (two) times daily as needed. 60 g 1   omeprazole  (PRILOSEC) 20 MG capsule TAKE 2 CAPSULES BY MOUTH DAILY 180 capsule 1   ondansetron  (ZOFRAN ) 4 MG tablet Take 1 tablet (4 mg total) by mouth every 8 (eight) hours as needed for nausea or vomiting. 20 tablet 0   ondansetron  (ZOFRAN -ODT) 4 MG disintegrating tablet Take 1 tablet (4 mg total) by mouth every 8 (eight) hours as needed for nausea or vomiting. 20 tablet 0   potassium chloride  SA (KLOR-CON ) 20 MEQ tablet Take 1 tablet (20 mEq total) by mouth 2 (two) times a week. 26 tablet 0   propranolol  (INDERAL ) 20 MG tablet TAKE 1 TABLET (20 MG TOTAL) BY MOUTH 3 (THREE) TIMES DAILY. 270 tablet 3   rizatriptan  (MAXALT ) 10 MG tablet Take 1 tablet (10 mg total) by mouth as needed for migraine. May repeat in 2 hours if needed 10 tablet 5   sertraline  (ZOLOFT ) 100 MG tablet Take 2 tablets (200 mg total) by mouth daily. 180 tablet 1   topiramate  (TOPAMAX ) 50 MG tablet TAKE ONE TABLET BY MOUTH AT BEDTIME FOR 7 DAYS THEN  INCREASE TO TWO TABLETS BY MOUTH AT EVERY NIGHT AT BEDTIME 180 tablet 0   Vitamin D , Ergocalciferol , (DRISDOL ) 1.25 MG (50000 UNIT) CAPS capsule Take 1 capsule (50,000 Units total) by mouth 2 (two) times a week. 12 capsule 1   No facility-administered medications  prior to visit.    Allergies  Allergen Reactions   Augmentin [Amoxicillin -Pot Clavulanate] Hives    Review of Systems  Constitutional:  Negative for weight loss.  HENT:  Positive for tinnitus (occasional). Negative for congestion and hearing loss.   Eyes:  Negative for blurred vision.  Respiratory:  Negative for cough.   Cardiovascular:  Negative for leg swelling.  Gastrointestinal:  Negative for constipation and diarrhea.  Genitourinary:  Negative for dysuria and frequency.  Musculoskeletal:  Positive for joint pain.  Skin:  Negative for rash.  Neurological:  Positive for headaches (on topamax , rare migraines but daily headaches).  Psychiatric/Behavioral:         Anniversary of two losses- continues sertraline  200mg .        Objective:    Physical Exam   BP 133/89 (BP Location: Right Arm, Patient Position: Sitting, Cuff Size: Large)   Pulse 88   Temp 98.9 F (37.2 C) (Oral)   Resp 16   Ht 5' 7 (1.702 m)   Wt (!) 384 lb (174.2 kg)   SpO2 97%   BMI 60.14 kg/m  Wt Readings from Last 3 Encounters:  12/24/23 (!) 384 lb (174.2 kg)  11/08/23 (!) 384 lb (174.2 kg)  10/18/23 (!) 381 lb (172.8 kg)   Physical Exam  Constitutional: Morbidly obese white female. She is oriented to person, place, and time.  No distress.  HENT:  Head: Normocephalic and atraumatic.  Right Ear: Tympanic membrane and ear canal normal.  Left Ear: Tympanic membrane and ear canal normal.  Mouth/Throat: Oropharynx is clear and moist.  Eyes: Pupils are equal, round, and reactive to light. No scleral icterus.  Neck: Normal range of motion. No thyromegaly present.  Cardiovascular: Normal rate and regular rhythm.   No murmur heard. Pulmonary/Chest: Effort normal and breath sounds normal. No respiratory distress. He has no wheezes. She has no rales. She exhibits no tenderness.  Abdominal: Soft. Bowel sounds are normal. She exhibits no distension and no mass. There is no tenderness. There is no rebound  and no guarding.  Musculoskeletal: She exhibits no edema.  Lymphadenopathy:    She has no cervical adenopathy.  Neurological: She is alert and oriented to person, place, and time. She has normal patellar reflexes. She exhibits normal muscle tone. Coordination normal.  Skin: Skin is warm and dry.  Psychiatric: She has a normal mood and affect. Her behavior is normal. Judgment and thought content normal.              Assessment & Plan:       Assessment & Plan:   Problem List Items Addressed This Visit       Unprioritized   Symptomatic anemia   Lab Results  Component Value Date   WBC 8.9 05/09/2023   HGB 14.4 05/09/2023   HCT 43.2 05/09/2023   MCV 92.0 05/09/2023   PLT 161.0 05/09/2023  Resolved following mirena  placement.        Sarcoidosis   In remission per patient.      RLS (restless legs syndrome)   Stable on gabapentin , continue same.       Preventative health care - Primary    Routine adult wellness visit. Discussed vaccinations and screenings. -  Schedule second Gardasil vaccine for 10/8 or later. - Administer flu shot during the next visit. - Schedule Pap smear with GYN. - Schedule eye exam. - Schedule dental appointment.      Papilledema   Had previously normal MRI back in 2021, due for follow up eye exam- pt will schedule.       OSA (obstructive sleep apnea)   Advised pt to follow back up with Dr. Kara for additional CPAP trials. Suspect untreated OSA is a contributor to her headaches.       Mixed hyperlipidemia   Lab Results  Component Value Date   CHOL 150 09/03/2019   HDL 39 (L) 09/03/2019   LDLCALC 89 09/03/2019   TRIG 120 09/03/2019   CHOLHDL 4 07/21/2019   Update lipid panel.       Relevant Orders   Lipid panel   Comp Met (CMET)   Migraines   Controlled with topamax .       Hypothyroidism   Stable on synthroid  . Was increased last visit from 175 mcg.   Lab Results  Component Value Date   TSH 7.09 (H)  11/08/2023         Relevant Orders   TSH   Other Visit Diagnoses       Dysphagia, unspecified type       Relevant Orders   Ambulatory referral to Gastroenterology       I am having Margaret Golden maintain her multivitamin with minerals, Fusion Plus, potassium chloride  SA, aspirin -acetaminophen -caffeine , furosemide , Mirena  (52 MG), (diphenhydrAMINE  HCl, Sleep, (ZZZQUIL PO)), ondansetron , propranolol , nystatin , gabapentin , LORazepam , lurasidone , methylphenidate , sertraline , ondansetron , methocarbamol , clotrimazole -betamethasone , omeprazole , cyclobenzaprine , HYDROcodone -acetaminophen , levothyroxine , rizatriptan , albuterol , Vitamin D  (Ergocalciferol ), and topiramate .  No orders of the defined types were placed in this encounter.

## 2023-12-24 NOTE — Assessment & Plan Note (Signed)
 Advised pt to follow back up with Dr. Kara for additional CPAP trials. Suspect untreated OSA is a contributor to her headaches.

## 2023-12-24 NOTE — Assessment & Plan Note (Signed)
 Stable on synthroid  200mcg. Was increased last visit from 175 mcg.   Lab Results  Component Value Date   TSH 7.09 (H) 11/08/2023

## 2023-12-24 NOTE — Assessment & Plan Note (Signed)
Controlled with topamax 

## 2023-12-24 NOTE — Patient Instructions (Signed)
 VISIT SUMMARY:  Today, you had your annual physical exam. We discussed your current health issues, including your thyroid  levels, depression, sleep apnea, headaches, and other concerns. We also reviewed your upcoming vaccinations and necessary health screenings.  YOUR PLAN:  DYSPHAGIA AND GASTROESOPHAGEAL REFLUX DISEASE: You have occasional difficulty swallowing and heartburn, which may be related to acid reflux. -Continue taking omeprazole  40 mg daily. -We will refer you to a gastroenterologist for an endoscopy to check for any esophageal damage.  HYPOTHYROIDISM: Your thyroid  levels were high, so we recently increased your medication dose. -We will check your TSH levels today to monitor your thyroid  function.  DEPRESSION: Your depression is well-managed with sertraline , but you experience seasonal worsening due to personal anniversaries. -Continue taking sertraline  200 mg daily. -Stay connected with your support group.  OBSTRUCTIVE SLEEP APNEA: You have sleep apnea and are having trouble finding a suitable CPAP mask, which may be contributing to your headaches and neck pain. -Follow up with your pulmonologist, Dr. Maryalice, to find a suitable CPAP mask.  HEADACHE: You experience nearly daily headaches, which are managed with Topamax  and Excedrin . These may be tension headaches and could be related to your sleep apnea and neck pain. -Continue taking Topamax  and Excedrin  as needed. -Address sleep apnea and neck pain to help reduce headaches.  RESTLESS LEGS SYNDROME: Your restless legs syndrome is managed with gabapentin . -Continue taking gabapentin  as prescribed.  SWELLING OF LOWER EXTREMITIES: You experience swelling in your legs, which is managed with compression socks. -Continue using compression socks to reduce swelling.  TINNITUS: You have occasional ringing in your ears. -Monitor the severity and watch for any hearing loss.  IRON  DEFICIENCY ANEMIA: You have a history of iron   deficiency anemia. -Follow up on your anemia status next month.  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 -No changes needed at this time.  GENERAL HEALTH MAINTENANCE: We discussed your routine health maintenance, including vaccinations and screenings. -Schedule your second Gardasil vaccine for October 8th or later. -Get your flu shot during your next visit. -Schedule your Pap smear with your gynecologist. -Schedule an eye exam. -Schedule a dental appointment.

## 2023-12-25 LAB — COMPREHENSIVE METABOLIC PANEL WITH GFR
ALT: 13 U/L (ref 0–35)
AST: 27 U/L (ref 0–37)
Albumin: 4.1 g/dL (ref 3.5–5.2)
Alkaline Phosphatase: 67 U/L (ref 39–117)
BUN: 9 mg/dL (ref 6–23)
CO2: 25 meq/L (ref 19–32)
Calcium: 8.8 mg/dL (ref 8.4–10.5)
Chloride: 104 meq/L (ref 96–112)
Creatinine, Ser: 0.75 mg/dL (ref 0.40–1.20)
GFR: 104.33 mL/min (ref >60.00)
Glucose, Bld: 97 mg/dL (ref 70–99)
Potassium: 4 meq/L (ref 3.5–5.1)
Sodium: 139 meq/L (ref 135–145)
Total Bilirubin: 0.8 mg/dL (ref 0.2–1.2)
Total Protein: 6.9 g/dL (ref 6.0–8.3)

## 2023-12-25 LAB — LIPID PANEL
Cholesterol: 161 mg/dL (ref 0–200)
HDL: 43.2 mg/dL (ref >39.00)
LDL Cholesterol: 86 mg/dL (ref 0–99)
NonHDL: 118.15
Total CHOL/HDL Ratio: 4
Triglycerides: 160 mg/dL — ABNORMAL HIGH (ref 0.0–149.0)
VLDL: 32 mg/dL (ref 0.0–40.0)

## 2023-12-25 LAB — TSH: TSH: 1.56 u[IU]/mL (ref 0.35–5.50)

## 2023-12-26 ENCOUNTER — Ambulatory Visit: Payer: Self-pay | Admitting: Family

## 2023-12-30 DIAGNOSIS — F4323 Adjustment disorder with mixed anxiety and depressed mood: Secondary | ICD-10-CM | POA: Diagnosis not present

## 2024-01-07 DIAGNOSIS — F4323 Adjustment disorder with mixed anxiety and depressed mood: Secondary | ICD-10-CM | POA: Diagnosis not present

## 2024-01-08 ENCOUNTER — Other Ambulatory Visit: Payer: Self-pay | Admitting: Family

## 2024-01-08 DIAGNOSIS — E039 Hypothyroidism, unspecified: Secondary | ICD-10-CM

## 2024-01-10 ENCOUNTER — Ambulatory Visit (INDEPENDENT_AMBULATORY_CARE_PROVIDER_SITE_OTHER): Admitting: Family

## 2024-01-10 DIAGNOSIS — Z23 Encounter for immunization: Secondary | ICD-10-CM

## 2024-01-10 NOTE — Progress Notes (Signed)
 Margaret Golden is a 33 y.o. female presents to the office today for 2nd Gardasil  and Flu injections, per physician's orders. Original order: Eleanor Ponto, NP second Gardasil vaccine for October 8th or later. and Flu HPV and Flu  (med), LD Flu and RD HPV (route) was administered  (location) today. Patient tolerated injection.   Matylda Fehring M Malaiya Paczkowski

## 2024-01-10 NOTE — Progress Notes (Signed)
 nu

## 2024-01-13 DIAGNOSIS — F4323 Adjustment disorder with mixed anxiety and depressed mood: Secondary | ICD-10-CM | POA: Diagnosis not present

## 2024-01-21 DIAGNOSIS — F4323 Adjustment disorder with mixed anxiety and depressed mood: Secondary | ICD-10-CM | POA: Diagnosis not present

## 2024-02-10 DIAGNOSIS — F4323 Adjustment disorder with mixed anxiety and depressed mood: Secondary | ICD-10-CM | POA: Diagnosis not present

## 2024-02-11 ENCOUNTER — Other Ambulatory Visit: Payer: Self-pay | Admitting: Family

## 2024-02-11 DIAGNOSIS — E559 Vitamin D deficiency, unspecified: Secondary | ICD-10-CM

## 2024-02-13 ENCOUNTER — Inpatient Hospital Stay: Payer: BC Managed Care – PPO | Attending: Family

## 2024-02-13 ENCOUNTER — Inpatient Hospital Stay: Payer: BC Managed Care – PPO | Admitting: Medical Oncology

## 2024-02-13 VITALS — BP 130/76 | HR 84 | Temp 97.7°F | Resp 17 | Wt 384.0 lb

## 2024-02-13 DIAGNOSIS — Z880 Allergy status to penicillin: Secondary | ICD-10-CM | POA: Diagnosis not present

## 2024-02-13 DIAGNOSIS — N920 Excessive and frequent menstruation with regular cycle: Secondary | ICD-10-CM | POA: Diagnosis not present

## 2024-02-13 DIAGNOSIS — Z793 Long term (current) use of hormonal contraceptives: Secondary | ICD-10-CM | POA: Insufficient documentation

## 2024-02-13 DIAGNOSIS — D500 Iron deficiency anemia secondary to blood loss (chronic): Secondary | ICD-10-CM

## 2024-02-13 DIAGNOSIS — D869 Sarcoidosis, unspecified: Secondary | ICD-10-CM | POA: Diagnosis not present

## 2024-02-13 LAB — IRON AND IRON BINDING CAPACITY (CC-WL,HP ONLY)
Iron: 66 ug/dL (ref 28–170)
Saturation Ratios: 17 % (ref 10.4–31.8)
TIBC: 388 ug/dL (ref 250–450)
UIBC: 322 ug/dL

## 2024-02-13 LAB — CBC
HCT: 42.8 % (ref 36.0–46.0)
Hemoglobin: 14.4 g/dL (ref 12.0–15.0)
MCH: 29.9 pg (ref 26.0–34.0)
MCHC: 33.6 g/dL (ref 30.0–36.0)
MCV: 88.8 fL (ref 80.0–100.0)
Platelets: 167 K/uL (ref 150–400)
RBC: 4.82 MIL/uL (ref 3.87–5.11)
RDW: 13.1 % (ref 11.5–15.5)
WBC: 7.6 K/uL (ref 4.0–10.5)
nRBC: 0 % (ref 0.0–0.2)

## 2024-02-13 LAB — FERRITIN: Ferritin: 146 ng/mL (ref 11–307)

## 2024-02-13 NOTE — Progress Notes (Signed)
 Hematology and Oncology Follow Up Visit  Margaret Golden [id number] July 11, 1990 33 y.o. 02/13/2024   Principle Diagnosis:  Iron  deficiency anemia secondary to heavy irregular cycles   Current Therapy:        IV iron  as indicated - Venofer - last dose was on 05/10/2020   Interim History:  Margaret Golden is here today for annual follow-up.   She is now off of Methotrexate  since her sarcoid significantly improved.  She continues to do well. She has no concerns at this time.  She has an IUD- menstrual cycles are light and now irregular  No other blood loss noted. No bruising or petechiae.  No fever, chills, n/v, cough, rash, dizziness, SOB, chest pain, palpitations, abdominal pain or changes in bowel or bladder habits.  Swelling in her extremities is unchanged from baseline.  Numbness and tingling comes and goes in her upper extremities.  No falls or syncope reported.  Appetite and hydration are good.  ECOG Performance Status: 1 - Symptomatic but completely ambulatory  Medications:  Allergies as of 02/13/2024       Reactions   Augmentin [amoxicillin -pot Clavulanate] Hives        Medication List        Accurate as of February 13, 2024  2:36 PM. If you have any questions, ask your nurse or doctor.          albuterol  108 (90 Base) MCG/ACT inhaler Commonly known as: VENTOLIN  HFA Inhale 2 puffs into the lungs every 6 (six) hours as needed for wheezing or shortness of breath.   aspirin -acetaminophen -caffeine  250-250-65 MG tablet Commonly known as: EXCEDRIN  MIGRAINE Take 2 tablets by mouth daily as needed for headache or migraine.   clotrimazole -betamethasone  cream Commonly known as: LOTRISONE  APPLY TOPICALLY TWICE DAILY TO ABDOMINAL FOLD FOR 14 DAYS   cyclobenzaprine  5 MG tablet Commonly known as: FLEXERIL  Take 1 tablet (5 mg total) by mouth 3 (three) times daily as needed for muscle spasms.   furosemide  20 MG tablet Commonly known as: LASIX  TAKE 1 TABLET BY MOUTH  TWO TIMES A WEEK   Fusion Plus Caps One capsule twice daily with orange juice. Stop your Ferrous sulfate .   gabapentin  300 MG capsule Commonly known as: Neurontin  Take 1 capsule (300 mg total) by mouth 2 (two) times daily.   HYDROcodone -acetaminophen  5-325 MG tablet Commonly known as: NORCO/VICODIN Take 1 tablet by mouth every 6 (six) hours as needed for moderate pain (pain score 4-6).   levothyroxine  200 MCG tablet Commonly known as: SYNTHROID  TAKE 1 TABLET BY MOUTH DAILY BEFORE BREAKFAST   LORazepam  0.5 MG tablet Commonly known as: Ativan  Take 1 tablet (0.5 mg total) by mouth every 8 (eight) hours as needed for anxiety.   lurasidone  40 MG Tabs tablet Commonly known as: Latuda  Take 1 tablet (40 mg total) by mouth daily with supper.   methocarbamol  500 MG tablet Commonly known as: Robaxin  Take 1 tablet (500 mg total) by mouth every 8 (eight) hours as needed for muscle spasms.   methylphenidate  10 MG tablet Commonly known as: RITALIN  Take 1/2-1 tablet daily as needed   Mirena  (52 MG) 20 MCG/DAY Iud Generic drug: levonorgestrel  1 each by Intrauterine route once.   multivitamin with minerals tablet Take 1 tablet by mouth daily.   nystatin  powder Commonly known as: MYCOSTATIN /NYSTOP  Apply 1 Application topically 2 (two) times daily as needed.   omeprazole  20 MG capsule Commonly known as: PRILOSEC TAKE 2 CAPSULES BY MOUTH DAILY   ondansetron  4 MG disintegrating tablet Commonly  known as: ZOFRAN -ODT Take 1 tablet (4 mg total) by mouth every 8 (eight) hours as needed for nausea or vomiting.   ondansetron  4 MG tablet Commonly known as: Zofran  Take 1 tablet (4 mg total) by mouth every 8 (eight) hours as needed for nausea or vomiting.   potassium chloride  SA 20 MEQ tablet Commonly known as: KLOR-CON  M Take 1 tablet (20 mEq total) by mouth 2 (two) times a week.   propranolol  20 MG tablet Commonly known as: INDERAL  TAKE 1 TABLET (20 MG TOTAL) BY MOUTH 3 (THREE) TIMES  DAILY.   rizatriptan  10 MG tablet Commonly known as: Maxalt  Take 1 tablet (10 mg total) by mouth as needed for migraine. May repeat in 2 hours if needed   sertraline  100 MG tablet Commonly known as: ZOLOFT  Take 2 tablets (200 mg total) by mouth daily.   topiramate  50 MG tablet Commonly known as: TOPAMAX  TAKE ONE TABLET BY MOUTH AT BEDTIME FOR 7 DAYS THEN INCREASE TO TWO TABLETS BY MOUTH AT EVERY NIGHT AT BEDTIME   Vitamin D  (Ergocalciferol ) 1.25 MG (50000 UNIT) Caps capsule Commonly known as: DRISDOL  Take 1 capsule (50,000 Units total) by mouth 2 (two) times a week.   ZZZQUIL PO Take by mouth at bedtime as needed.        Allergies:  Allergies  Allergen Reactions   Augmentin [Amoxicillin -Pot Clavulanate] Hives    Past Medical History, Surgical history, Social history, and Family History were reviewed and updated.  Review of Systems: All other 10 point review of systems is negative.   Physical Exam:  weight is 384 lb (174.2 kg) (abnormal). Her oral temperature is 97.7 F (36.5 C). Her blood pressure is 130/76 and her pulse is 84. Her respiration is 17 and oxygen saturation is 99%.   Wt Readings from Last 3 Encounters:  02/13/24 (!) 384 lb (174.2 kg)  12/24/23 (!) 384 lb (174.2 kg)  11/08/23 (!) 384 lb (174.2 kg)    Ocular: Sclerae unicteric, pupils equal, round and reactive to light Ear-nose-throat: Oropharynx clear, dentition fair Lymphatic: No cervical or supraclavicular adenopathy Lungs no rales or rhonchi, good excursion bilaterally Heart regular rate and rhythm, no murmur appreciated Abd soft, nontender, positive bowel sounds MSK no focal spinal tenderness, no joint edema Neuro: non-focal, well-oriented, appropriate affect  Lab Results  Component Value Date   WBC 7.6 02/13/2024   HGB 14.4 02/13/2024   HCT 42.8 02/13/2024   MCV 88.8 02/13/2024   PLT 167 02/13/2024   Lab Results  Component Value Date   FERRITIN 137 02/13/2023   IRON  102 02/13/2023    TIBC 407 02/13/2023   UIBC 305 02/13/2023   IRONPCTSAT 25 02/13/2023   Lab Results  Component Value Date   RETICCTPCT 2.1 02/13/2023   RBC 4.82 02/13/2024   No results found for: KPAFRELGTCHN, LAMBDASER, KAPLAMBRATIO No results found for: IGGSERUM, IGA, IGMSERUM No results found for: STEPHANY CARLOTA BENSON MARKEL EARLA JOANNIE DOC VICK, SPEI   Chemistry      Component Value Date/Time   NA 139 12/24/2023 1447   K 4.0 12/24/2023 1447   CL 104 12/24/2023 1447   CO2 25 12/24/2023 1447   BUN 9 12/24/2023 1447   CREATININE 0.75 12/24/2023 1447   CREATININE 0.88 11/08/2023 1556      Component Value Date/Time   CALCIUM 8.8 12/24/2023 1447   ALKPHOS 67 12/24/2023 1447   AST 27 12/24/2023 1447   AST 27 09/30/2019 1047   ALT 13 12/24/2023 1447   ALT  14 09/30/2019 1047   BILITOT 0.8 12/24/2023 1447   BILITOT 0.4 09/30/2019 1047      Encounter Diagnoses  Name Primary?   Iron  deficiency anemia due to chronic blood loss Yes   Menorrhagia with regular cycle     Impression and Plan: Ms. Begeman is a very pleasant 33 yo caucasian female with long history of iron  deficiency anemia secondary to history of heavy irregular cycles. Now has an IUD which has helped lighten menses. She also has sarcoidosis and was previously on methotrexate  for this.   CBC shows a Hgb of 14.4. MCV of 88.8.  Platelets are normal at 167 Iron  studies are pending. We will replace if needed.   RTC 1 year APP, labs (CBC, iron , ferritin)  Lauraine CHRISTELLA Dais, PA-C 11/13/20252:36 PM

## 2024-02-18 ENCOUNTER — Ambulatory Visit: Payer: Self-pay | Admitting: Medical Oncology

## 2024-02-18 DIAGNOSIS — F4323 Adjustment disorder with mixed anxiety and depressed mood: Secondary | ICD-10-CM | POA: Diagnosis not present

## 2024-02-20 DIAGNOSIS — F909 Attention-deficit hyperactivity disorder, unspecified type: Secondary | ICD-10-CM | POA: Diagnosis not present

## 2024-02-20 DIAGNOSIS — F330 Major depressive disorder, recurrent, mild: Secondary | ICD-10-CM | POA: Diagnosis not present

## 2024-02-20 DIAGNOSIS — G4700 Insomnia, unspecified: Secondary | ICD-10-CM | POA: Diagnosis not present

## 2024-02-20 DIAGNOSIS — F411 Generalized anxiety disorder: Secondary | ICD-10-CM | POA: Diagnosis not present

## 2024-02-24 DIAGNOSIS — F4323 Adjustment disorder with mixed anxiety and depressed mood: Secondary | ICD-10-CM | POA: Diagnosis not present

## 2024-02-29 ENCOUNTER — Other Ambulatory Visit: Payer: Self-pay | Admitting: Family

## 2024-02-29 DIAGNOSIS — G43809 Other migraine, not intractable, without status migrainosus: Secondary | ICD-10-CM

## 2024-03-09 DIAGNOSIS — F4323 Adjustment disorder with mixed anxiety and depressed mood: Secondary | ICD-10-CM | POA: Diagnosis not present

## 2024-03-17 ENCOUNTER — Encounter (HOSPITAL_BASED_OUTPATIENT_CLINIC_OR_DEPARTMENT_OTHER): Payer: Self-pay

## 2024-04-16 ENCOUNTER — Other Ambulatory Visit: Payer: Self-pay | Admitting: Family

## 2024-04-16 DIAGNOSIS — E039 Hypothyroidism, unspecified: Secondary | ICD-10-CM

## 2025-02-11 ENCOUNTER — Inpatient Hospital Stay: Admitting: Medical Oncology

## 2025-02-11 ENCOUNTER — Inpatient Hospital Stay
